# Patient Record
Sex: Female | Born: 1950 | Race: White | Hispanic: No | Smoking: Former smoker
Health system: Southern US, Community
[De-identification: ages and names within clinical notes are randomized; demographics above are authoritative.]

## PROBLEM LIST (undated history)

## (undated) DIAGNOSIS — F32A Depression, unspecified: Secondary | ICD-10-CM

## (undated) DIAGNOSIS — N189 Chronic kidney disease, unspecified: Secondary | ICD-10-CM

## (undated) DIAGNOSIS — F1120 Opioid dependence, uncomplicated: Secondary | ICD-10-CM

## (undated) DIAGNOSIS — D649 Anemia, unspecified: Secondary | ICD-10-CM

## (undated) DIAGNOSIS — K219 Gastro-esophageal reflux disease without esophagitis: Secondary | ICD-10-CM

## (undated) DIAGNOSIS — G629 Polyneuropathy, unspecified: Secondary | ICD-10-CM

## (undated) DIAGNOSIS — M4800 Spinal stenosis, site unspecified: Secondary | ICD-10-CM

## (undated) DIAGNOSIS — F988 Other specified behavioral and emotional disorders with onset usually occurring in childhood and adolescence: Secondary | ICD-10-CM

## (undated) DIAGNOSIS — H269 Unspecified cataract: Secondary | ICD-10-CM

## (undated) DIAGNOSIS — M25562 Pain in left knee: Secondary | ICD-10-CM

## (undated) DIAGNOSIS — E669 Obesity, unspecified: Secondary | ICD-10-CM

## (undated) DIAGNOSIS — I499 Cardiac arrhythmia, unspecified: Secondary | ICD-10-CM

## (undated) DIAGNOSIS — G709 Myoneural disorder, unspecified: Secondary | ICD-10-CM

## (undated) DIAGNOSIS — M1990 Unspecified osteoarthritis, unspecified site: Secondary | ICD-10-CM

## (undated) DIAGNOSIS — T7840XA Allergy, unspecified, initial encounter: Secondary | ICD-10-CM

## (undated) DIAGNOSIS — I10 Essential (primary) hypertension: Secondary | ICD-10-CM

## (undated) DIAGNOSIS — F419 Anxiety disorder, unspecified: Secondary | ICD-10-CM

## (undated) DIAGNOSIS — K635 Polyp of colon: Secondary | ICD-10-CM

## (undated) HISTORY — DX: Essential (primary) hypertension: I10

## (undated) HISTORY — DX: Chronic kidney disease, unspecified: N18.9

## (undated) HISTORY — PX: BREAST SURGERY: SHX581

## (undated) HISTORY — PX: OTHER SURGICAL HISTORY: SHX169

## (undated) HISTORY — DX: Pain in left knee: M25.562

## (undated) HISTORY — PX: JOINT REPLACEMENT: SHX530

## (undated) HISTORY — DX: Unspecified cataract: H26.9

## (undated) HISTORY — DX: Opioid dependence, uncomplicated: F11.20

## (undated) HISTORY — DX: Anxiety disorder, unspecified: F41.9

## (undated) HISTORY — DX: Depression, unspecified: F32.A

## (undated) HISTORY — DX: Obesity, unspecified: E66.9

## (undated) HISTORY — DX: Polyp of colon: K63.5

## (undated) HISTORY — DX: Gastro-esophageal reflux disease without esophagitis: K21.9

## (undated) HISTORY — PX: APPENDECTOMY: SHX54

## (undated) HISTORY — PX: ROTATOR CUFF REPAIR: SHX139

## (undated) HISTORY — PX: CHOLECYSTECTOMY: SHX55

## (undated) HISTORY — DX: Allergy, unspecified, initial encounter: T78.40XA

## (undated) HISTORY — DX: Spinal stenosis, site unspecified: M48.00

## (undated) HISTORY — DX: Myoneural disorder, unspecified: G70.9

## (undated) HISTORY — DX: Other specified behavioral and emotional disorders with onset usually occurring in childhood and adolescence: F98.8

## (undated) HISTORY — PX: BREAST BIOPSY: SHX20

---

## 1998-02-02 ENCOUNTER — Other Ambulatory Visit: Admission: RE | Admit: 1998-02-02 | Discharge: 1998-02-02 | Payer: Self-pay | Admitting: *Deleted

## 1999-10-05 ENCOUNTER — Encounter: Admission: RE | Admit: 1999-10-05 | Discharge: 1999-10-05 | Payer: Self-pay | Admitting: *Deleted

## 1999-10-05 ENCOUNTER — Encounter: Payer: Self-pay | Admitting: *Deleted

## 2000-04-05 ENCOUNTER — Encounter: Payer: Self-pay | Admitting: *Deleted

## 2000-04-05 ENCOUNTER — Encounter: Payer: Self-pay | Admitting: Emergency Medicine

## 2000-04-05 ENCOUNTER — Emergency Department (HOSPITAL_COMMUNITY): Admission: EM | Admit: 2000-04-05 | Discharge: 2000-04-05 | Payer: Self-pay | Admitting: *Deleted

## 2001-07-22 HISTORY — PX: GASTRIC BYPASS: SHX52

## 2002-02-03 ENCOUNTER — Other Ambulatory Visit: Admission: RE | Admit: 2002-02-03 | Discharge: 2002-02-03 | Payer: Self-pay | Admitting: Internal Medicine

## 2002-03-29 ENCOUNTER — Encounter: Admission: RE | Admit: 2002-03-29 | Discharge: 2002-05-05 | Payer: Self-pay | Admitting: Surgery

## 2002-03-31 ENCOUNTER — Encounter: Admission: RE | Admit: 2002-03-31 | Discharge: 2002-03-31 | Payer: Self-pay | Admitting: Surgery

## 2002-03-31 ENCOUNTER — Encounter: Payer: Self-pay | Admitting: Surgery

## 2002-05-21 ENCOUNTER — Encounter: Admission: RE | Admit: 2002-05-21 | Discharge: 2002-08-19 | Payer: Self-pay | Admitting: Surgery

## 2002-06-08 ENCOUNTER — Inpatient Hospital Stay (HOSPITAL_COMMUNITY): Admission: RE | Admit: 2002-06-08 | Discharge: 2002-06-16 | Payer: Self-pay | Admitting: Surgery

## 2002-06-09 ENCOUNTER — Encounter: Payer: Self-pay | Admitting: Surgery

## 2002-06-11 ENCOUNTER — Encounter: Payer: Self-pay | Admitting: Surgery

## 2002-06-13 ENCOUNTER — Encounter: Payer: Self-pay | Admitting: Surgery

## 2002-06-14 ENCOUNTER — Encounter: Payer: Self-pay | Admitting: Surgery

## 2002-06-16 ENCOUNTER — Encounter: Payer: Self-pay | Admitting: Internal Medicine

## 2002-07-22 HISTORY — PX: KIDNEY DONATION: SHX685

## 2002-10-05 ENCOUNTER — Encounter: Admission: RE | Admit: 2002-10-05 | Discharge: 2003-01-03 | Payer: Self-pay | Admitting: Surgery

## 2004-10-11 ENCOUNTER — Ambulatory Visit: Payer: Self-pay | Admitting: Internal Medicine

## 2005-04-23 ENCOUNTER — Ambulatory Visit: Payer: Self-pay | Admitting: Internal Medicine

## 2005-04-24 ENCOUNTER — Encounter: Payer: Self-pay | Admitting: Internal Medicine

## 2005-04-24 ENCOUNTER — Encounter: Admission: RE | Admit: 2005-04-24 | Discharge: 2005-04-24 | Payer: Self-pay | Admitting: Internal Medicine

## 2005-04-30 ENCOUNTER — Other Ambulatory Visit: Admission: RE | Admit: 2005-04-30 | Discharge: 2005-04-30 | Payer: Self-pay | Admitting: Internal Medicine

## 2005-04-30 ENCOUNTER — Encounter: Payer: Self-pay | Admitting: Internal Medicine

## 2005-04-30 ENCOUNTER — Ambulatory Visit: Payer: Self-pay | Admitting: Internal Medicine

## 2005-05-20 ENCOUNTER — Ambulatory Visit: Payer: Self-pay | Admitting: Gastroenterology

## 2005-06-03 ENCOUNTER — Encounter (INDEPENDENT_AMBULATORY_CARE_PROVIDER_SITE_OTHER): Payer: Self-pay | Admitting: *Deleted

## 2005-06-03 ENCOUNTER — Ambulatory Visit: Payer: Self-pay | Admitting: Gastroenterology

## 2005-06-03 ENCOUNTER — Encounter: Payer: Self-pay | Admitting: Internal Medicine

## 2005-06-03 LAB — HM COLONOSCOPY

## 2005-06-08 ENCOUNTER — Encounter: Payer: Self-pay | Admitting: Internal Medicine

## 2005-07-24 ENCOUNTER — Ambulatory Visit: Payer: Self-pay | Admitting: Family Medicine

## 2005-10-31 ENCOUNTER — Ambulatory Visit: Payer: Self-pay | Admitting: Internal Medicine

## 2005-11-21 ENCOUNTER — Ambulatory Visit: Payer: Self-pay | Admitting: Internal Medicine

## 2005-12-26 ENCOUNTER — Ambulatory Visit: Payer: Self-pay | Admitting: Internal Medicine

## 2006-07-11 ENCOUNTER — Ambulatory Visit: Payer: Self-pay | Admitting: Family Medicine

## 2006-09-23 ENCOUNTER — Ambulatory Visit: Payer: Self-pay | Admitting: Internal Medicine

## 2006-12-04 ENCOUNTER — Encounter: Payer: Self-pay | Admitting: Internal Medicine

## 2006-12-04 DIAGNOSIS — J309 Allergic rhinitis, unspecified: Secondary | ICD-10-CM | POA: Insufficient documentation

## 2006-12-04 DIAGNOSIS — Z9189 Other specified personal risk factors, not elsewhere classified: Secondary | ICD-10-CM | POA: Insufficient documentation

## 2006-12-04 DIAGNOSIS — E669 Obesity, unspecified: Secondary | ICD-10-CM | POA: Insufficient documentation

## 2006-12-04 DIAGNOSIS — Z9889 Other specified postprocedural states: Secondary | ICD-10-CM | POA: Insufficient documentation

## 2007-01-20 ENCOUNTER — Ambulatory Visit: Payer: Self-pay | Admitting: Internal Medicine

## 2007-01-20 DIAGNOSIS — I10 Essential (primary) hypertension: Secondary | ICD-10-CM | POA: Insufficient documentation

## 2007-01-21 ENCOUNTER — Telehealth: Payer: Self-pay | Admitting: Internal Medicine

## 2007-03-16 ENCOUNTER — Telehealth (INDEPENDENT_AMBULATORY_CARE_PROVIDER_SITE_OTHER): Payer: Self-pay | Admitting: *Deleted

## 2007-04-07 ENCOUNTER — Encounter: Payer: Self-pay | Admitting: Internal Medicine

## 2007-05-26 ENCOUNTER — Ambulatory Visit: Payer: Self-pay | Admitting: Internal Medicine

## 2007-07-28 ENCOUNTER — Ambulatory Visit: Payer: Self-pay | Admitting: Internal Medicine

## 2007-07-28 LAB — CONVERTED CEMR LAB
ALT: 13 units/L (ref 0–35)
AST: 16 units/L (ref 0–37)
Albumin: 3.8 g/dL (ref 3.5–5.2)
Alkaline Phosphatase: 76 units/L (ref 39–117)
BUN: 6 mg/dL (ref 6–23)
Basophils Absolute: 0 10*3/uL (ref 0.0–0.1)
Basophils Relative: 0.3 % (ref 0.0–1.0)
Bilirubin Urine: NEGATIVE
Bilirubin, Direct: 0.1 mg/dL (ref 0.0–0.3)
Blood in Urine, dipstick: NEGATIVE
CO2: 30 meq/L (ref 19–32)
Calcium: 9.4 mg/dL (ref 8.4–10.5)
Chloride: 95 meq/L — ABNORMAL LOW (ref 96–112)
Cholesterol: 192 mg/dL (ref 0–200)
Creatinine, Ser: 1 mg/dL (ref 0.4–1.2)
Eosinophils Absolute: 0.1 10*3/uL (ref 0.0–0.6)
Eosinophils Relative: 1.8 % (ref 0.0–5.0)
GFR calc Af Amer: 73 mL/min
GFR calc non Af Amer: 61 mL/min
Glucose, Bld: 88 mg/dL (ref 70–99)
Glucose, Urine, Semiquant: NEGATIVE
HCT: 34.3 % — ABNORMAL LOW (ref 36.0–46.0)
HDL: 71.7 mg/dL (ref >39.0)
Hemoglobin: 11.8 g/dL — ABNORMAL LOW (ref 12.0–15.0)
Ketones, urine, test strip: NEGATIVE
LDL Cholesterol: 110 mg/dL — ABNORMAL HIGH (ref 0–99)
Lymphocytes Relative: 24.1 % (ref 12.0–46.0)
MCHC: 34.5 g/dL (ref 30.0–36.0)
MCV: 85.7 fL (ref 78.0–100.0)
Monocytes Absolute: 0.3 10*3/uL (ref 0.2–0.7)
Monocytes Relative: 7 % (ref 3.0–11.0)
Neutro Abs: 2.9 10*3/uL (ref 1.4–7.7)
Neutrophils Relative %: 66.8 % (ref 43.0–77.0)
Nitrite: NEGATIVE
Platelets: 337 10*3/uL (ref 150–400)
Potassium: 3.9 meq/L (ref 3.5–5.1)
Protein, U semiquant: NEGATIVE
RBC: 4 M/uL (ref 3.87–5.11)
RDW: 13.1 % (ref 11.5–14.6)
Sodium: 133 meq/L — ABNORMAL LOW (ref 135–145)
Specific Gravity, Urine: 1.015
TSH: 0.75 microintl units/mL (ref 0.35–5.50)
Total Bilirubin: 0.6 mg/dL (ref 0.3–1.2)
Total CHOL/HDL Ratio: 2.7
Total Protein: 6.1 g/dL (ref 6.0–8.3)
Triglycerides: 53 mg/dL (ref 0–149)
Urobilinogen, UA: 0.2
VLDL: 11 mg/dL (ref 0–40)
WBC Urine, dipstick: NEGATIVE
WBC: 4.4 10*3/uL — ABNORMAL LOW (ref 4.5–10.5)
pH: 7.5

## 2007-08-03 ENCOUNTER — Ambulatory Visit: Payer: Self-pay | Admitting: Internal Medicine

## 2007-08-03 DIAGNOSIS — Z860100 Personal history of colon polyps, unspecified: Secondary | ICD-10-CM | POA: Insufficient documentation

## 2007-09-14 ENCOUNTER — Ambulatory Visit: Payer: Self-pay | Admitting: Internal Medicine

## 2007-09-14 DIAGNOSIS — F988 Other specified behavioral and emotional disorders with onset usually occurring in childhood and adolescence: Secondary | ICD-10-CM | POA: Insufficient documentation

## 2007-09-22 ENCOUNTER — Telehealth: Payer: Self-pay | Admitting: Internal Medicine

## 2007-10-12 ENCOUNTER — Ambulatory Visit: Payer: Self-pay | Admitting: Internal Medicine

## 2007-11-09 ENCOUNTER — Ambulatory Visit: Payer: Self-pay | Admitting: Internal Medicine

## 2008-01-19 ENCOUNTER — Telehealth: Payer: Self-pay | Admitting: Internal Medicine

## 2008-01-20 ENCOUNTER — Telehealth: Payer: Self-pay | Admitting: Internal Medicine

## 2008-02-01 ENCOUNTER — Ambulatory Visit: Payer: Self-pay | Admitting: Internal Medicine

## 2008-03-08 ENCOUNTER — Telehealth: Payer: Self-pay | Admitting: Internal Medicine

## 2008-03-17 ENCOUNTER — Ambulatory Visit: Payer: Self-pay | Admitting: Family Medicine

## 2008-03-17 DIAGNOSIS — J0100 Acute maxillary sinusitis, unspecified: Secondary | ICD-10-CM | POA: Insufficient documentation

## 2008-04-29 ENCOUNTER — Telehealth: Payer: Self-pay | Admitting: Internal Medicine

## 2008-05-02 ENCOUNTER — Telehealth: Payer: Self-pay | Admitting: Internal Medicine

## 2008-05-25 ENCOUNTER — Ambulatory Visit: Payer: Self-pay | Admitting: Internal Medicine

## 2008-06-01 ENCOUNTER — Telehealth: Payer: Self-pay | Admitting: Internal Medicine

## 2008-06-20 ENCOUNTER — Telehealth: Payer: Self-pay | Admitting: Internal Medicine

## 2008-06-23 ENCOUNTER — Telehealth: Payer: Self-pay | Admitting: Internal Medicine

## 2008-07-11 ENCOUNTER — Ambulatory Visit: Payer: Self-pay | Admitting: Internal Medicine

## 2008-07-11 DIAGNOSIS — J069 Acute upper respiratory infection, unspecified: Secondary | ICD-10-CM | POA: Insufficient documentation

## 2008-07-27 ENCOUNTER — Ambulatory Visit: Payer: Self-pay | Admitting: Internal Medicine

## 2008-07-27 LAB — CONVERTED CEMR LAB
ALT: 24 units/L (ref 0–35)
AST: 26 units/L (ref 0–37)
Albumin: 3.9 g/dL (ref 3.5–5.2)
Alkaline Phosphatase: 86 units/L (ref 39–117)
BUN: 13 mg/dL (ref 6–23)
Basophils Absolute: 0 10*3/uL (ref 0.0–0.1)
Basophils Relative: 0.4 % (ref 0.0–3.0)
Bilirubin Urine: NEGATIVE
Bilirubin, Direct: 0.1 mg/dL (ref 0.0–0.3)
Blood in Urine, dipstick: NEGATIVE
CO2: 31 meq/L (ref 19–32)
Calcium: 9.5 mg/dL (ref 8.4–10.5)
Chloride: 104 meq/L (ref 96–112)
Cholesterol: 206 mg/dL (ref 0–200)
Creatinine, Ser: 1.1 mg/dL (ref 0.4–1.2)
Direct LDL: 109.1 mg/dL
Eosinophils Absolute: 0.1 10*3/uL (ref 0.0–0.7)
Eosinophils Relative: 3 % (ref 0.0–5.0)
GFR calc Af Amer: 66 mL/min
GFR calc non Af Amer: 54 mL/min
Glucose, Bld: 127 mg/dL — ABNORMAL HIGH (ref 70–99)
Glucose, Urine, Semiquant: NEGATIVE
HCT: 34.8 % — ABNORMAL LOW (ref 36.0–46.0)
HDL: 65.2 mg/dL (ref >39.0)
Hemoglobin: 11.7 g/dL — ABNORMAL LOW (ref 12.0–15.0)
Ketones, urine, test strip: NEGATIVE
Lymphocytes Relative: 24 % (ref 12.0–46.0)
MCHC: 33.6 g/dL (ref 30.0–36.0)
MCV: 88.6 fL (ref 78.0–100.0)
Monocytes Absolute: 0.3 10*3/uL (ref 0.1–1.0)
Monocytes Relative: 6.9 % (ref 3.0–12.0)
Neutro Abs: 3.2 10*3/uL (ref 1.4–7.7)
Neutrophils Relative %: 65.7 % (ref 43.0–77.0)
Nitrite: NEGATIVE
Platelets: 253 10*3/uL (ref 150–400)
Potassium: 4 meq/L (ref 3.5–5.1)
Protein, U semiquant: NEGATIVE
RBC: 3.93 M/uL (ref 3.87–5.11)
RDW: 13.5 % (ref 11.5–14.6)
Sodium: 144 meq/L (ref 135–145)
Specific Gravity, Urine: 1.02
TSH: 0.82 microintl units/mL (ref 0.35–5.50)
Total Bilirubin: 0.6 mg/dL (ref 0.3–1.2)
Total CHOL/HDL Ratio: 3.2
Total Protein: 6.9 g/dL (ref 6.0–8.3)
Triglycerides: 103 mg/dL (ref 0–149)
Urobilinogen, UA: 0.2
VLDL: 21 mg/dL (ref 0–40)
WBC Urine, dipstick: NEGATIVE
WBC: 4.7 10*3/uL (ref 4.5–10.5)
pH: 5

## 2008-08-02 ENCOUNTER — Ambulatory Visit: Payer: Self-pay | Admitting: Internal Medicine

## 2008-10-06 ENCOUNTER — Ambulatory Visit: Payer: Self-pay | Admitting: Family Medicine

## 2008-10-06 DIAGNOSIS — M25569 Pain in unspecified knee: Secondary | ICD-10-CM

## 2008-10-10 ENCOUNTER — Telehealth: Payer: Self-pay | Admitting: *Deleted

## 2008-10-11 ENCOUNTER — Ambulatory Visit: Payer: Self-pay | Admitting: Internal Medicine

## 2008-10-24 ENCOUNTER — Telehealth: Payer: Self-pay | Admitting: Internal Medicine

## 2008-11-09 ENCOUNTER — Telehealth: Payer: Self-pay | Admitting: Internal Medicine

## 2008-11-17 ENCOUNTER — Encounter: Payer: Self-pay | Admitting: Internal Medicine

## 2008-12-06 ENCOUNTER — Telehealth: Payer: Self-pay | Admitting: Internal Medicine

## 2008-12-06 ENCOUNTER — Telehealth (INDEPENDENT_AMBULATORY_CARE_PROVIDER_SITE_OTHER): Payer: Self-pay | Admitting: *Deleted

## 2008-12-20 ENCOUNTER — Telehealth: Payer: Self-pay | Admitting: Internal Medicine

## 2008-12-27 ENCOUNTER — Telehealth: Payer: Self-pay | Admitting: Internal Medicine

## 2008-12-30 ENCOUNTER — Ambulatory Visit: Payer: Self-pay | Admitting: Internal Medicine

## 2009-01-24 ENCOUNTER — Telehealth: Payer: Self-pay | Admitting: Internal Medicine

## 2009-01-26 ENCOUNTER — Telehealth: Payer: Self-pay | Admitting: Internal Medicine

## 2009-02-01 ENCOUNTER — Telehealth: Payer: Self-pay | Admitting: Internal Medicine

## 2009-02-06 ENCOUNTER — Ambulatory Visit: Payer: Self-pay | Admitting: Internal Medicine

## 2009-02-06 DIAGNOSIS — F1910 Other psychoactive substance abuse, uncomplicated: Secondary | ICD-10-CM | POA: Insufficient documentation

## 2009-02-14 ENCOUNTER — Telehealth (INDEPENDENT_AMBULATORY_CARE_PROVIDER_SITE_OTHER): Payer: Self-pay | Admitting: *Deleted

## 2009-02-23 ENCOUNTER — Telehealth: Payer: Self-pay | Admitting: Internal Medicine

## 2009-03-23 ENCOUNTER — Telehealth: Payer: Self-pay | Admitting: Internal Medicine

## 2009-03-24 ENCOUNTER — Telehealth (INDEPENDENT_AMBULATORY_CARE_PROVIDER_SITE_OTHER): Payer: Self-pay | Admitting: *Deleted

## 2009-03-29 ENCOUNTER — Telehealth (INDEPENDENT_AMBULATORY_CARE_PROVIDER_SITE_OTHER): Payer: Self-pay | Admitting: *Deleted

## 2009-04-04 ENCOUNTER — Ambulatory Visit: Payer: Self-pay | Admitting: Internal Medicine

## 2009-04-13 ENCOUNTER — Telehealth: Payer: Self-pay | Admitting: Internal Medicine

## 2009-06-05 ENCOUNTER — Telehealth: Payer: Self-pay | Admitting: Internal Medicine

## 2009-06-13 ENCOUNTER — Encounter: Payer: Self-pay | Admitting: Internal Medicine

## 2009-06-26 ENCOUNTER — Telehealth: Payer: Self-pay | Admitting: Internal Medicine

## 2009-07-04 ENCOUNTER — Encounter (INDEPENDENT_AMBULATORY_CARE_PROVIDER_SITE_OTHER): Payer: Self-pay | Admitting: *Deleted

## 2009-07-04 ENCOUNTER — Ambulatory Visit: Payer: Self-pay | Admitting: Internal Medicine

## 2009-08-21 ENCOUNTER — Telehealth: Payer: Self-pay | Admitting: Internal Medicine

## 2009-08-23 ENCOUNTER — Telehealth: Payer: Self-pay | Admitting: Internal Medicine

## 2009-09-18 ENCOUNTER — Telehealth: Payer: Self-pay | Admitting: Internal Medicine

## 2009-12-11 ENCOUNTER — Telehealth: Payer: Self-pay | Admitting: Internal Medicine

## 2009-12-19 ENCOUNTER — Ambulatory Visit: Payer: Self-pay | Admitting: Internal Medicine

## 2009-12-19 DIAGNOSIS — IMO0001 Reserved for inherently not codable concepts without codable children: Secondary | ICD-10-CM

## 2010-05-15 ENCOUNTER — Telehealth: Payer: Self-pay

## 2010-05-29 ENCOUNTER — Ambulatory Visit: Payer: Self-pay | Admitting: Internal Medicine

## 2010-05-29 LAB — CONVERTED CEMR LAB
ALT: 14 units/L (ref 0–35)
AST: 18 units/L (ref 0–37)
Albumin: 3.8 g/dL (ref 3.5–5.2)
Alkaline Phosphatase: 91 units/L (ref 39–117)
BUN: 17 mg/dL (ref 6–23)
Basophils Absolute: 0 10*3/uL (ref 0.0–0.1)
Basophils Relative: 0.7 % (ref 0.0–3.0)
Bilirubin Urine: NEGATIVE
Bilirubin, Direct: 0.1 mg/dL (ref 0.0–0.3)
Blood in Urine, dipstick: NEGATIVE
CO2: 31 meq/L (ref 19–32)
Calcium: 9.2 mg/dL (ref 8.4–10.5)
Chloride: 99 meq/L (ref 96–112)
Cholesterol: 195 mg/dL (ref 0–200)
Creatinine, Ser: 1 mg/dL (ref 0.4–1.2)
Eosinophils Absolute: 0.1 10*3/uL (ref 0.0–0.7)
Eosinophils Relative: 3.1 % (ref 0.0–5.0)
GFR calc non Af Amer: 58.11 mL/min (ref >60)
Glucose, Bld: 95 mg/dL (ref 70–99)
Glucose, Urine, Semiquant: NEGATIVE
HCT: 32.6 % — ABNORMAL LOW (ref 36.0–46.0)
HDL: 69.5 mg/dL (ref >39.00)
Hemoglobin: 10.7 g/dL — ABNORMAL LOW (ref 12.0–15.0)
Ketones, urine, test strip: NEGATIVE
LDL Cholesterol: 113 mg/dL — ABNORMAL HIGH (ref 0–99)
Lymphocytes Relative: 23.1 % (ref 12.0–46.0)
Lymphs Abs: 0.9 10*3/uL (ref 0.7–4.0)
MCHC: 32.9 g/dL (ref 30.0–36.0)
MCV: 78.9 fL (ref 78.0–100.0)
Monocytes Absolute: 0.3 10*3/uL (ref 0.1–1.0)
Monocytes Relative: 6.8 % (ref 3.0–12.0)
Neutro Abs: 2.5 10*3/uL (ref 1.4–7.7)
Neutrophils Relative %: 66.3 % (ref 43.0–77.0)
Nitrite: NEGATIVE
Platelets: 263 10*3/uL (ref 150.0–400.0)
Potassium: 3.4 meq/L — ABNORMAL LOW (ref 3.5–5.1)
Protein, U semiquant: NEGATIVE
RBC: 4.13 M/uL (ref 3.87–5.11)
RDW: 14.8 % — ABNORMAL HIGH (ref 11.5–14.6)
Sodium: 139 meq/L (ref 135–145)
Specific Gravity, Urine: 1.02
TSH: 2.48 microintl units/mL (ref 0.35–5.50)
Total Bilirubin: 0.3 mg/dL (ref 0.3–1.2)
Total CHOL/HDL Ratio: 3
Total Protein: 6.3 g/dL (ref 6.0–8.3)
Triglycerides: 61 mg/dL (ref 0.0–149.0)
Urobilinogen, UA: 1
VLDL: 12.2 mg/dL (ref 0.0–40.0)
WBC Urine, dipstick: NEGATIVE
WBC: 3.7 10*3/uL — ABNORMAL LOW (ref 4.5–10.5)
pH: 6.5

## 2010-05-31 LAB — CONVERTED CEMR LAB: Vit D, 25-Hydroxy: 18 ng/mL — ABNORMAL LOW (ref 30–89)

## 2010-06-05 ENCOUNTER — Ambulatory Visit: Payer: Self-pay | Admitting: Internal Medicine

## 2010-06-20 ENCOUNTER — Encounter: Payer: Self-pay | Admitting: Internal Medicine

## 2010-06-22 ENCOUNTER — Encounter: Payer: Self-pay | Admitting: Internal Medicine

## 2010-08-14 ENCOUNTER — Telehealth: Payer: Self-pay | Admitting: Internal Medicine

## 2010-08-21 NOTE — Progress Notes (Signed)
Summary: refill  Phone Note Refill Request Call back at Home Phone [phone] Message from:  Patient--live call  Refills Requested: Medication #1:  AMPHETAMINE-DEXTROAMPHETAMINE 30 MG XR24H-CAP one daily call pt when ready.  Initial call taken by: Warnell Forester,  May 15, 2010 8:26 AM    Prescriptions: AMPHETAMINE-DEXTROAMPHETAMINE 30 MG XR24H-CAP (AMPHETAMINE-DEXTROAMPHETAMINE) one daily  #30 x 0   Entered by:   Willy Eddy, LPN   Authorized by:   Gordy Savers  MD   Signed by:   Willy Eddy, LPN on 95/62/1308   Method used:   Print then Give to Patient   RxID:   [id number]

## 2010-08-21 NOTE — Medication Information (Signed)
Summary: PRIOR AUTHORIZATION REQUEST  PRIOR AUTHORIZATION REQUEST Provider: This provider was preselected by the workflow.  Signature: The signature status of this document was preset by the workflow  Processed by [employer] Local Indexer Client @ Thursday, July 13, 2009 9:57:24 AM using version:2010.1.2.11(2.4)   Manually Indexed By: [id number]  idlBatchDetail: [id number]   _____________________________________________________________________  External Attachment:    Type:   Image     Comment:   External Document

## 2010-08-21 NOTE — Progress Notes (Signed)
Summary: refill adderall  Phone Note Call from Patient   Caller: Patient Summary of Call: request refill on adderall call when ready for pick up  [phone] Initial call taken by: Duard Brady LPN,  Dec 11, 2009 5:23 PM    Prescriptions: ADDERALL 10 MG TABS (AMPHETAMINE-DEXTROAMPHETAMINE) One tid  #90 x 0   Entered and Authorized by:   Gordy Savers  MD   Signed by:   Gordy Savers  MD on 12/12/2009   Method used:   Print then Give to Patient   RxID:   [id number] AMPHETAMINE-DEXTROAMPHETAMINE 30 MG XR24H-CAP (AMPHETAMINE-DEXTROAMPHETAMINE) one daily  #30 x 0   Entered and Authorized by:   Gordy Savers  MD   Signed by:   Gordy Savers  MD on 12/12/2009   Method used:   Print then Give to Patient   RxID:   [id number]   Appended Document: refill adderall pt aware rx's ready for pick up . KIK

## 2010-08-21 NOTE — Progress Notes (Signed)
  Phone Note Call from Patient   Caller: Patient Call For: k Summary of Call: claims she left medicines in hotel- needs both Rx's for Adderall Initial call taken by: Raechel Ache, RN,  August 21, 2009 8:23 AM  Follow-up for Phone Call        #30 each with RF 1 Follow-up by: Gordy Savers  MD,  August 21, 2009 10:09 AM    Prescriptions: AMPHETAMINE-DEXTROAMPHETAMINE 30 MG XR24H-CAP (AMPHETAMINE-DEXTROAMPHETAMINE) one daily  #30 x 0   Entered by:   Raechel Ache, RN   Authorized by:   Gordy Savers  MD   Signed by:   Raechel Ache, RN on 08/21/2009   Method used:   Print then Give to Patient   RxID:   [id number] AMPHETAMINE SALT COMBO 10 MG  TABS (AMPHETAMINE-DEXTROAMPHETAMINE) one  daily in the afternoon  #30 x 0   Entered by:   Raechel Ache, RN   Authorized by:   Gordy Savers  MD   Signed by:   Raechel Ache, RN on 08/21/2009   Method used:   Print then Give to Patient   RxID:   [id number]  adderal er 30 #30 and adderal 10 #30 RF 2

## 2010-08-21 NOTE — Assessment & Plan Note (Signed)
Summary: cpx//ccm/PT RSC/CJR   Vital Signs:  Patient profile:   60 year old female Height:      64.75 inches Weight:      187 pounds BMI:     31.47 Temp:     98.1 degrees F oral BP sitting:   110 / 82  (left arm) Cuff size:   regular  Vitals Entered By: Duard Brady LPN (June 05, 2010 2:50 PM) CC: cpx - doing well  Is Patient Diabetic? No   CC:  cpx - doing well .  History of Present Illness: 60 year old patient who is seen today for a health maintenance examination.  She has a history of hypertension, exogenous obesity and has recently diagnosed and treated for vitamin D deficiency.  She has allergic rhinitis.  She has a history of a narcotic addiction and is on a tapering dose of suboxone.  Allergies: 1)  Sulfamethoxazole (Sulfamethoxazole)  Past History:  Past Medical History: Allergic rhinitis Hypertension obesity status post gastric bypass Colonic polyps, hx of (hyperplastic) ADD chronic left knee pain   Narcotic addiction vitamin D deficiency  Past Surgical History: Reviewed history from 08/02/2008 and no changes required. Cholecystectomy 2004, kidney donation breast biopsy 1998 2003, laparoscopic gastric jejunostomy colonoscopy November 2006  Family History: Reviewed history from 08/02/2008 and no changes required. father died age 50 complications of COPD mother died age 21   Status post CABG history congestive heart failure  One brother history of CAD  One sister, type 2 diabetes, status post renal transplant  Social History: Reviewed history from 08/02/2008 and no changes required. Married  Review of Systems  The patient denies anorexia, fever, weight loss, weight gain, vision loss, decreased hearing, hoarseness, chest pain, syncope, dyspnea on exertion, peripheral edema, prolonged cough, headaches, hemoptysis, abdominal pain, melena, hematochezia, severe indigestion/heartburn, hematuria, incontinence, genital sores, muscle weakness,  suspicious skin lesions, transient blindness, difficulty walking, depression, unusual weight change, abnormal bleeding, enlarged lymph nodes, angioedema, and breast masses.    Physical Exam  General:  overweight-appearing.  normal blood pressureoverweight-appearing.   Head:  Normocephalic and atraumatic without obvious abnormalities. No apparent alopecia or balding. Eyes:  No corneal or conjunctival inflammation noted. EOMI. Perrla. Funduscopic exam benign, without hemorrhages, exudates or papilledema. Vision grossly normal. Ears:  External ear exam shows no significant lesions or deformities.  Otoscopic examination reveals clear canals, tympanic membranes are intact bilaterally without bulging, retraction, inflammation or discharge. Hearing is grossly normal bilaterally. Mouth:  Oral mucosa and oropharynx without lesions or exudates.  Teeth in good repair. Neck:  No deformities, masses, or tenderness noted. Chest Wall:  No deformities, masses, or tenderness noted. Breasts:  No mass, nodules, thickening, tenderness, bulging, retraction, inflamation, nipple discharge or skin changes noted.   Lungs:  Normal respiratory effort, chest expands symmetrically. Lungs are clear to auscultation, no crackles or wheezes. Heart:  Normal rate and regular rhythm. S1 and S2 normal without gallop, murmur, click, rub or other extra sounds. Abdomen:  Bowel sounds positive,abdomen soft and non-tender without masses, organomegaly or hernias noted. Rectal:  No external abnormalities noted. Normal sphincter tone. No rectal masses or tenderness. Genitalia:  Normal introitus for age, no external lesions, no vaginal discharge, mucosa pink and moist, no vaginal or cervical lesions, no vaginal atrophy, no friaility or hemorrhage, normal uterus size and position, no adnexal masses or tenderness Msk:  No deformity or scoliosis noted of thoracic or lumbar spine.   Pulses:  R and L carotid,radial,femoral,dorsalis pedis and  posterior tibial pulses are full  and equal bilaterally Extremities:  No clubbing, cyanosis, edema, or deformity noted with normal full range of motion of all joints.   Neurologic:  No cranial nerve deficits noted. Station and gait are normal. Plantar reflexes are down-going bilaterally. DTRs are symmetrical throughout. Sensory, motor and coordinative functions appear intact. Skin:  Intact without suspicious lesions or rashes Cervical Nodes:  No lymphadenopathy noted Axillary Nodes:  No palpable lymphadenopathy Inguinal Nodes:  No significant adenopathy Psych:  Cognition and judgment appear intact. Alert and cooperative with normal attention span and concentration. No apparent delusions, illusions, hallucinations   Complete Medication List: 1)  Ambien 10 Mg Tabs (Zolpidem tartrate) .Marland Kitchen.. 1 at bedtime as needed 2)  Hydrochlorothiazide 25 Mg Tabs (Hydrochlorothiazide) .... Take 1 tablet by mouth once a day 3)  Prozac 40 Mg Caps (Fluoxetine hcl) .Marland Kitchen.. 1 two times a day 4)  Amphetamine Salt Combo 10 Mg Tabs (Amphetamine-dextroamphetamine) .... One  daily in the afternoon 5)  Nasacort Aq 55 Mcg/act Aers (Triamcinolone acetonide(nasal)) .... 2 sprays each nostril qd 6)  Suboxone 8-2 Mg Subl (Buprenorphine hcl-naloxone hcl) .Marland Kitchen.. 1 two times a day 7)  Amphetamine-dextroamphetamine 30 Mg Xr24h-cap (Amphetamine-dextroamphetamine) .... One daily 8)  Vitamin D (ergocalciferol) 50000 Unit Caps (Ergocalciferol) .Marland Kitchen.. 1 by mouth qweek for 3 mos  Other Orders: T-Bone Densitometry [phone])  Patient Instructions: 1)  Limit your Sodium (Salt). 2)  It is important that you exercise regularly at least 20 minutes 5 times a week. If you develop chest pain, have severe difficulty breathing, or feel very tired , stop exercising immediately and seek medical attention. 3)  You need to lose weight. Consider a lower calorie diet and regular exercise.  4)  Take calcium +Vitamin D daily. 5)  bone density study 6)  Please  schedule a follow-up appointment in 6 months. Prescriptions: AMPHETAMINE-DEXTROAMPHETAMINE 30 MG XR24H-CAP (AMPHETAMINE-DEXTROAMPHETAMINE) one daily  #30 x 0   Entered and Authorized by:   Gordy Savers  MD   Signed by:   Gordy Savers  MD on 06/05/2010   Method used:   Print then Give to Patient   RxID:   [id number] AMPHETAMINE-DEXTROAMPHETAMINE 30 MG XR24H-CAP (AMPHETAMINE-DEXTROAMPHETAMINE) one daily  #30 x 0   Entered and Authorized by:   Gordy Savers  MD   Signed by:   Gordy Savers  MD on 06/05/2010   Method used:   Print then Give to Patient   RxID:   [id number] AMPHETAMINE-DEXTROAMPHETAMINE 30 MG XR24H-CAP (AMPHETAMINE-DEXTROAMPHETAMINE) one daily  #30 x 0   Entered and Authorized by:   Gordy Savers  MD   Signed by:   Gordy Savers  MD on 06/05/2010   Method used:   Print then Give to Patient   RxID:   [id number] NASACORT AQ 55 MCG/ACT AERS (TRIAMCINOLONE ACETONIDE(NASAL)) 2 sprays each nostril qd  #3 x 11   Entered and Authorized by:   Gordy Savers  MD   Signed by:   Gordy Savers  MD on 06/05/2010   Method used:   Print then Give to Patient   RxID:   [id number] HYDROCHLOROTHIAZIDE 25 MG TABS (HYDROCHLOROTHIAZIDE) Take 1 tablet by mouth once a day  #90 x 5   Entered and Authorized by:   Gordy Savers  MD   Signed by:   Gordy Savers  MD on 06/05/2010   Method used:   Print then Give to Patient   RxID:   [id number] AMBIEN 10 MG TABS (ZOLPIDEM TARTRATE)  1 at bedtime as needed  #30 x 3   Entered and Authorized by:   Gordy Savers  MD   Signed by:   Gordy Savers  MD on 06/05/2010   Method used:   Print then Give to Patient   RxID:   [id number]    Orders Added: 1)  T-Bone Densitometry [77080] 2)  Est. Patient 40-64 years [44010]   Immunization History:  Influenza Immunization History:    Influenza:  Historical (05/22/2010)   Immunization  History:  Influenza Immunization History:    Influenza:  Historical (05/22/2010)  done at work per pt. Earlean Polka

## 2010-08-21 NOTE — Progress Notes (Signed)
Summary: adderall reuqest  Phone Note Call from Patient Call back at [facility] Phone [phone]   Summary of Call: Insurance will not pay for the early Rxs you gave.  She requests Rx for a generic & not XR & not 10mg  because she already has that for the pm dose, that she can take instead of the Adderall XR 30mg  for this early refill.  Please call her if questions & when ready for pickup.   Initial call taken by: Rudy Jew, RN,  August 23, 2009 9:42 AM  Follow-up for Phone Call        Take adderal 10 mg TID Follow-up by: Gordy Savers  MD,  August 24, 2009 7:58 AM  Additional Follow-up for Phone Call Additional follow up Details #1::        She says will need a written script for this.  Would it be for #90?   Additional Follow-up by: Rudy Jew, RN,  August 29, 2009 8:05 AM    Additional Follow-up for Phone Call Additional follow up Details #2::    OK Follow-up by: Gordy Savers  MD,  August 29, 2009 9:10 AM  Additional Follow-up for Phone Call Additional follow up Details #3:: Details for Additional Follow-up Action Taken: Left message that Rx ready for pickup. Additional Follow-up by: Rudy Jew, RN,  August 29, 2009 2:39 PM  New/Updated Medications: ADDERALL 10 MG TABS (AMPHETAMINE-DEXTROAMPHETAMINE) One tid Prescriptions: ADDERALL 10 MG TABS (AMPHETAMINE-DEXTROAMPHETAMINE) One tid  #90 x 0   Entered by:   Rudy Jew, RN   Authorized by:   Gordy Savers  MD   Signed by:   Rudy Jew, RN on 08/29/2009   Method used:   Print then Give to Patient   RxID:   [id number]

## 2010-08-21 NOTE — Assessment & Plan Note (Signed)
Summary: TICK BITE/PS   Vital Signs:  Patient profile:   60 year old female Weight:      187 pounds Temp:     97.4 degrees F oral BP sitting:   120 / 64  (right arm) Cuff size:   regular  Vitals Entered By: Duard Brady LPN (Dec 19, 2009 10:05 AM) CC: tick bite - (R) grion area - from saturday Is Patient Diabetic? No   CC:  tick bite - (R) grion area - from saturday.  History of Present Illness: 60 year old patient who is seen today in after removing a tic from the right groin area over the weekend.  Last night.  She had an episode of chills, myalgias, and today still feels weak with nausea and some mild diarrhea.  Last night.  There has been no documented fever.  Denies any typical URI symptoms.  She has treated hypertension, which has been stable  Allergies: 1)  Sulfamethoxazole (Sulfamethoxazole)  Past History:  Past Medical History: Reviewed history from 04/04/2009 and no changes required. Allergic rhinitis Hypertension obesity status post gastric bypass Colonic polyps, hx of ADD chronic left knee pain  Review of Systems       The patient complains of anorexia, fever, and muscle weakness.  The patient denies weight loss, weight gain, vision loss, decreased hearing, hoarseness, chest pain, syncope, dyspnea on exertion, peripheral edema, prolonged cough, headaches, hemoptysis, abdominal pain, melena, hematochezia, severe indigestion/heartburn, hematuria, incontinence, genital sores, suspicious skin lesions, transient blindness, difficulty walking, depression, unusual weight change, abnormal bleeding, enlarged lymph nodes, angioedema, and breast masses.    Physical Exam  General:  overweight-appearing. normal blood pressure;  no acute distress Head:  Normocephalic and atraumatic without obvious abnormalities. No apparent alopecia or balding. Eyes:  No corneal or conjunctival inflammation noted. EOMI. Perrla. Funduscopic exam benign, without hemorrhages, exudates or  papilledema. Vision grossly normal. Ears:  External ear exam shows no significant lesions or deformities.  Otoscopic examination reveals clear canals, tympanic membranes are intact bilaterally without bulging, retraction, inflammation or discharge. Hearing is grossly normal bilaterally. Mouth:  Oral mucosa and oropharynx without lesions or exudates.  Teeth in good repair. Neck:  No deformities, masses, or tenderness noted. Lungs:  Normal respiratory effort, chest expands symmetrically. Lungs are clear to auscultation, no crackles or wheezes. Heart:  Normal rate and regular rhythm. S1 and S2 normal without gallop, murmur, click, rub or other extra sounds. Skin:  examination right groin area revealed no clear abnormalities   Impression & Recommendations:  Problem # 1:  TICK BITE (ICD-E906.4)  Problem # 2:  MYALGIA (ICD-729.1)  Her updated medication list for this problem includes:    Suboxone 8-2 Mg Subl (Buprenorphine hcl-naloxone hcl) .Marland Kitchen... 1 two times a day  Complete Medication List: 1)  Ambien 10 Mg Tabs (Zolpidem tartrate) .Marland Kitchen.. 1 at bedtime as needed 2)  Hydrochlorothiazide 25 Mg Tabs (Hydrochlorothiazide) .... Take 1 tablet by mouth once a day 3)  Prozac 40 Mg Caps (Fluoxetine hcl) .Marland Kitchen.. 1 two times a day 4)  Amphetamine Salt Combo 10 Mg Tabs (Amphetamine-dextroamphetamine) .... One  daily in the afternoon 5)  Nasacort Aq 55 Mcg/act Aers (Triamcinolone acetonide(nasal)) .... 2 sprays each nostril qd 6)  Suboxone 8-2 Mg Subl (Buprenorphine hcl-naloxone hcl) .Marland Kitchen.. 1 two times a day 7)  Amphetamine-dextroamphetamine 30 Mg Xr24h-cap (Amphetamine-dextroamphetamine) .... One daily 8)  Adderall 10 Mg Tabs (Amphetamine-dextroamphetamine) .... One tid 9)  Doxycycline Hyclate 100 Mg Caps (Doxycycline hyclate) .... One twice daily  Patient Instructions: 1)  call if there is any clinical worsening high fever, or headache Prescriptions: DOXYCYCLINE HYCLATE 100 MG CAPS (DOXYCYCLINE HYCLATE) one  twice daily  #14 x 0   Entered and Authorized by:   Gordy Savers  MD   Signed by:   Gordy Savers  MD on 12/19/2009   Method used:   Electronically to        [facility] [address] [phone]* (retail)       [address]       [place], [place]  [address]       Ph: [phone] or [phone]       Fax: [phone]   RxID:   [id number]

## 2010-08-21 NOTE — Medication Information (Signed)
Summary: Prior Authorization and Approval for Adderall  Prior Authorization and Approval for Adderall   Imported By: Maryln Gottron 06/27/2010 15:53:00  _____________________________________________________________________  External Attachment:    Type:   Image     Comment:   External Document

## 2010-08-21 NOTE — Progress Notes (Signed)
Summary: Adderall rx's needed  Phone Note Call from Patient   Caller: Patient Call For: Gordy Savers  MD Summary of Call: Pt called requesting Rx's for Adderall - ER 30mg  and 10 mg , needs 3 mos. call when ready for pick up.  KIK Initial call taken by: Duard Brady LPN,  September 18, 2009 11:53 AM    Prescriptions: AMPHETAMINE-DEXTROAMPHETAMINE 30 MG XR24H-CAP (AMPHETAMINE-DEXTROAMPHETAMINE) one daily  #30 x 0   Entered and Authorized by:   Gordy Savers  MD   Signed by:   Gordy Savers  MD on 09/18/2009   Method used:   Print then Give to Patient   RxID:   [id number] AMPHETAMINE SALT COMBO 10 MG  TABS (AMPHETAMINE-DEXTROAMPHETAMINE) one  daily in the afternoon  #30 x 0   Entered and Authorized by:   Gordy Savers  MD   Signed by:   Gordy Savers  MD on 09/18/2009   Method used:   Print then Give to Patient   RxID:   [id number]   Appended Document: Adderall rx's needed pt aware rx's ready for pick up.  KIK

## 2010-08-23 NOTE — Progress Notes (Signed)
Summary: refill zolpidem  Phone Note Refill Request Message from:  Fax from Pharmacy on August 14, 2010 3:24 PM  Refills Requested: Medication #1:  AMBIEN 10 MG TABS 1 at bedtime as needed [facility] [address]   Method Requested: Fax to Local Pharmacy Initial call taken by: Duard Brady LPN,  August 14, 2010 3:24 PM    Prescriptions: AMBIEN 10 MG TABS (ZOLPIDEM TARTRATE) 1 at bedtime as needed  #30 x 0   Entered by:   Duard Brady LPN   Authorized by:   Gordy Savers  MD   Signed by:   Duard Brady LPN on 14/78/2956   Method used:   Historical   RxID:   [id number]  faxed back to [facility] KIK

## 2010-08-24 NOTE — Medication Information (Signed)
Summary: Prior Authorization and Approval for Zolpidem Tartrate  Prior Authorization and Approval for Zolpidem Tartrate   Imported By: Maryln Gottron 06/22/2010 15:19:35  _____________________________________________________________________  External Attachment:    Type:   Image     Comment:   External Document

## 2010-09-06 ENCOUNTER — Other Ambulatory Visit: Payer: Self-pay | Admitting: Internal Medicine

## 2010-09-06 DIAGNOSIS — Z139 Encounter for screening, unspecified: Secondary | ICD-10-CM

## 2010-09-07 ENCOUNTER — Ambulatory Visit
Admission: RE | Admit: 2010-09-07 | Discharge: 2010-09-07 | Disposition: A | Discharge status: Home / Self Care | Payer: BC Managed Care – PPO | Source: Ambulatory Visit | Attending: Internal Medicine | Admitting: Internal Medicine

## 2010-09-07 DIAGNOSIS — Z139 Encounter for screening, unspecified: Secondary | ICD-10-CM

## 2010-09-10 ENCOUNTER — Telehealth: Payer: Self-pay | Admitting: Internal Medicine

## 2010-09-10 MED ORDER — AMPHETAMINE-DEXTROAMPHETAMINE 30 MG PO TABS: 30.0000 mg | ORAL_TABLET | Freq: Every day | ORAL | Status: DC

## 2010-09-10 NOTE — Telephone Encounter (Signed)
OK to drop by [place] for RF

## 2010-09-10 NOTE — Telephone Encounter (Signed)
Pt needs new rx adderall 30 mg.

## 2010-09-10 NOTE — Telephone Encounter (Signed)
Attempt to call - VM - left msg rx's are ready for pick up   KIK

## 2010-09-13 ENCOUNTER — Telehealth: Payer: Self-pay | Admitting: Internal Medicine

## 2010-09-13 NOTE — Telephone Encounter (Signed)
Spoke with pharmacy - pt states that she should be on XR capsule. That is what she has been on in the past. Gave verbal - will change in med list

## 2010-09-13 NOTE — Telephone Encounter (Signed)
Leslie [name] at [facility]   [phone].....called to adv that the pts Rx for Adderall was written for the regular adderall instead of the XR??   Wants to verify that this wasn't a mistake.... Can you call to clarify?

## 2010-09-18 ENCOUNTER — Other Ambulatory Visit: Payer: Self-pay

## 2010-09-18 MED ORDER — ZOLPIDEM TARTRATE 10 MG PO TABS: 10.0000 mg | ORAL_TABLET | Freq: Every evening | ORAL | Status: DC | PRN

## 2010-09-18 NOTE — Telephone Encounter (Signed)
Faxed refill back to [facility]. KIK

## 2010-12-07 NOTE — Discharge Summary (Signed)
NAME:  Leslie [name], Leslie [name] NO.:  [id number]   MEDICAL RECORD NO.:  [id number]                   PATIENT TYPE:  INP   LOCATION:  0462                                 FACILITY:  [facility]   PHYSICIAN:  Sandria Bales. Ezzard Standing, M.D.               DATE OF BIRTH:  08/03/1950   DATE OF ADMISSION:  06/08/2002  DATE OF DISCHARGE:  06/16/2002                                 DISCHARGE SUMMARY   DISCHARGE DIAGNOSES:  1. Morbid obesity with BMI of approximately 46.  2. Leak from staple line.  3. Postoperative atelectasis.   OPERATION PERFORMED:  The patient had a laparoscopic Roux-en-Y  gastrojejunostomy (retrocolic retrogastric) and esophagogastroscopy on the  18th of November 2003.  On the June 09, 2002 patient had a laparoscopic  exploration with oversewing of gastric staple line, a laparoscopic  gastrostomy placed with a number 24 gastrostomy tube, and  esophagogastroscopy.   INDICATIONS FOR PROCEDURE:  The patient is a 60 year old morbidly obese  white female who is a patient of Gordy Savers, M.D.  She had also  seen Dr. Donzetta Sprung in the past and I had seen her in the past for breast  mass.   She tried multiple diets to lose weight and her diet problems go back as far  as 35 years where she has tried multiple regimens including Weight Watchers,  Redux, Meridia, [place] Weight Loss, Atkin's diet.  She was interested in  surgical correction or assistance with her obesity.  She has seen Dr. Cyndia Skeeters  as a psychiatric evaluation.  She has been through our dietary information  sessions and has been to one of our bariatric talks.   Weight is approximately 282-288 pounds with a BMI between 45-46.   ALLERGIES:  She is allergic to SULFA DRUGS.   CURRENT MEDICATIONS:  Prozac.  She has been on for about eight years.   PAST SURGICAL HISTORY:  Open ____ back in 1981.  She had a breast biopsy for  fibroadenoma in 1998.   SOCIAL HISTORY:  Works for [employer] for [employer].   She comes for attempted laparoscopic Roux-en-Y gastrojejunostomy bypass for  control of her morbid obesity.   PREOPERATIVE LABORATORIES:  Hemoglobin 12.7, hematocrit 37, white blood  count 5200.  Sodium 136, potassium 4.2, chloride 102, CO2 27, glucose 105,  BUN 15, creatinine 0.8, total protein 7.4, albumin 4.0.   The patient was taken to the operating room on the day of admission where  she underwent a laparoscopic Roux-en-Y gastrojejunostomy (this was placed  retrocolic, retrogastric) and an esophagogastroscopy.   Postoperatively she did well the night of surgery.  The following morning  she was having some left-sided pain but she had her first day postoperative  upper GI Gastrografin swallow which showed a leak apparently coming from the  gastric pouch pulling up into the left subdiaphragmatic area.   She was taken back to the  operating room on the first postoperative day  where she underwent a laparoscopic exploration.  I actually was unable to  see the leak from outside.  When I endoscoped her I could see where material  was leaving the stomach at the apex of the staple line up toward the gastric  cardia/angle of hiss.  I then oversewed this area with a single Vicryl  suture, placed a number 24 gastrostomy tube laparoscopically, placed two  drains, and repeat endoscopy showed no evidence of leak.   This prolonged her hospital course.  On the first postoperative day after  the closure of the leak her hemoglobin was 10, hematocrit 32, white count  10,300.  She was ambulated.  We kept her n.p.o. for five days and she was  covered with cefotetan which actually was substituted with _____ she was in  the hospital.   A repeat _______ on June 14, 2002, which was postoperative day number  five from her reexploration, showed no evidence of leak.  She was then  started on clear liquids.  Her right abdominal drain was removed and then on  the 26th which was  the eighth day from her original day, the seventh day  postoperative from her reexploration, she was ready for discharge.  I  removed her other drain.  She was given liquid Roxicet for pain.  She  already had diet instructions as far as Glucerna and Ensure per [employer].  She was to do no lifting of greater than 10 pounds for at least  a week to 10 days.  She had appointment to see me back in about a week's  time.  Her discharge condition was good.                                               Sandria Bales. Ezzard Standing, M.D.    DHN/MEDQ  D:  06/30/2002  T:  06/30/2002  Job:  [id number]   cc:   Gordy Savers, M.D. [facility]  [address] [place]  [place] [address]  Fax: 1   Vilinda Flake, Ph.D.

## 2010-12-07 NOTE — Op Note (Signed)
NAME:  Leslie [name], Leslie [name] NO.:  [id number]   MEDICAL RECORD NO.:  [id number]                   PATIENT TYPE:  INP   LOCATION:  0472                                 FACILITY:  [facility]   PHYSICIAN:  Sandria Bales. Ezzard Standing, M.D.               DATE OF BIRTH:  1950-11-14   DATE OF PROCEDURE:  DATE OF DISCHARGE:                                 OPERATIVE REPORT   PREOPERATIVE DIAGNOSIS:  Leak from proximal anastomosis.   POSTOPERATIVE DIAGNOSIS:  Leak from stomach pouch, left lateral staple line.   PROCEDURES:  1. Laparoscopic exploration with oversewing of gastric staple line  with     Vicryl.  2. Laparoscopic gastrostomy with a #24 gastrostomy tube.  3. Placement of two Blake drains, one in the left upper quadrant an  one in     the right upper quadrant.  4. Esophagogastroscopy.   SURGEON:  Sandria Bales. Ezzard Standing, M.D. Valorie Roosevelt is Dr. Monia Pouch.   FIRST ASSISTANTS:  Lorne Skeens. Johna Sheriff, M.D. and Vikki Ports,  M.D.   ANESTHESIA:  General endotracheal anesthesia.   ESTIMATED BLOOD LOSS:  50 cc.   DRAINS:  Two Blake drains.   INDICATIONS FOR PROCEDURE:  The patient is a 60 year old white female who on  June 08, 2002, had a laparoscopic roux-en-Y gastrojejunostomy which was  retrocolic, retrogastric for morbid obesity. She did well last evening and  had a white blood count about 12,000 this morning, but went down for a  swallow done by Dr. Ronney Asters, which showed  a small leak which tapered out  of what looked like the proximal of the stomach. It was hard to tell exactly  where it was, but it looked like kind of lateral and high.   We repositioned our trocars. All were put in the day before except for the  10 mm in the right lower quadrant, so that means we put a liver retractor in  the upper abdomen, we put a 5 mm lateral left upper quadrant, a 12 mm  lateral left mid subcostal, a 12 mm almost mid abdomen, a 12 mm right  midline and a 12 mm  right mid subcostal.   We re-exposed the stomach first and went back and found the suture line.  Actually things looked very good from  the outside. We saw a little bit of  fibrinous debris laterally but no  obvious contamination or gross  abnormality.   We then went up and  endoscoped the patient. We passed the Olympus flexible  endoscope down he mouth without difficulty into her stomach remnant.  However, when entering the pouch, I could see the anastomosis looked good.  The staple line looked good except at the very lateral tail which  would  have been the most left lateral extent of the staple line. There was a  little pinhole which was probably not much more than 2 or 3 mm,  and we could  see debris which was kind of tracking down this hole. We thought we had  found the defect of where her leak was.   We then went back in the belly. I put in a figure-of-eight 2-0 Vicryl suture  over the area of the loop and re-endoscoped and saw no evidence of leak  despite irrigating and putting the stomach under a fair amount of pressure.  We then irrigated out the abdominal cavity. We placed a Blake drain in the  left upper quadrant and the Blake drain in the right upper quadrant. Both  drains lay kind of behind the stomach anterior to the anastomosis.   I then made a gastrotomy tube laparoscopically through this passing a #1 PDS  suture in 4 quadrants around the stomach along the greater curvature, then  doing a pursestring with a 2-0 Vicryl suture, then placing a 24 gastrostomy  tube into  the open stomach. The balloon was blown up to 15 cc. The  pursestring was then cinched down. The whole stomach was brought to the  anterior abdominal wall. We had also put closes at each incision which were  then tied down after the abdomen had been desufflated.   I irrigated each wound with Betadine and then used skin staples on the skin.  Dr. Cyndie Chime placed a right internal jugular for me at the  beginning of  the case. She was on Mefoxin. This was actually a substitute for cefotetan  as my order; will continue postoperatively.   The patient tolerated the procedure well. Her blood pressure and heart rate  stayed stable. She was transferred to the recovery room in good condition.  Sponge and needle count were correct at the end of the case.                                                 Sandria Bales. Ezzard Standing, M.D.    DHN/MEDQ  D:  06/09/2002  T:  06/09/2002  Job:  [id number]

## 2010-12-07 NOTE — Op Note (Signed)
NAME:  Leslie [name], Leslie [name] NO.:  [id number]   MEDICAL RECORD NO.:  [id number]                   PATIENT TYPE:  INP   LOCATION:  0472                                 FACILITY:  [id number]   PHYSICIAN:  Sandria Bales. Ezzard Standing, M.D.               DATE OF BIRTH:  12/04/1950   DATE OF PROCEDURE:  06/08/2002  DATE OF DISCHARGE:                                 OPERATIVE REPORT   CCS NUMBER:  [id number]   PREOPERATIVE DIAGNOSIS:  Morbid obesity with a body mass index of  approximately 45-46.   POSTOPERATIVE DIAGNOSIS:  Morbid obesity with a body mass index of  approximately 45-46.   PROCEDURE:  Laparoscopic Roux-en-Y gastrojejunostomy with  esophagogastrostomy.   SURGEON:  Sandria Bales. Ezzard Standing, M.D.   PROCTOR:  Baldemar Friday, M.D.   FIRST ASSISTANTS:  1. Vikki Ports, M.D.  2. Thornton Park. Daphine Deutscher, M.D.   ANESTHESIA:  General endotracheal.   ESTIMATED BLOOD LOSS:  100 cc.   DRAINS LEFT IN:  66 [place] Blake drain in the left upper quadrant.   INDICATIONS:  The patient is a 60 year old white female whose BMI is  approximately 60.  She has been unable to control her weight by diet.  She  now comes for attempted laparoscopic Roux-en-Y gastric bypass.  She  understands both the indications of the procedure and also the extensive  risks and complications of which a permit she has signed and reviewed by  herself and myself.   DESCRIPTION OF PROCEDURE:  She presents to the operating room where she  underwent a general endotracheal anesthesia.  She had been on a diet for two  weeks preoperatively and a bowel prep two days preoperatively.  She was  given 1 g of Cefotetan prior to the procedure and 500 mg subcu Heparin two  hours before and had PAS stockings in placed.   The patient was placed in the supine position with her arms out to her side.  Her heels and ankles were padded.  She had a Foley catheter placed.  Her  abdomen was prepped with Betadine solution and  sterilely draped.   We started with a trocar in the left upper quadrant using a hook to grab the  fascia and placed a Veress needle into the abdominal cavity, insufflated to  about 1/4 liters of CO2.  Then using the Visiport got to the abdominal  cavity.  We had to use a total of seven ports.  There was a 10 mm toward the  right of midline about the level of the umbilicus.  There were two left  subcostal ports; one was a 5 mm, one was a 12 mm.  There was a central port  which was about 20 cm below the xiphoid which was about 12 mm.  There was  one port to the right of midline and one port to the left of midline which  were both  12 mm and there was the retractor port in the upper abdomen.  First, the incision was taken down.  The adhesions from her prior open  cholecystectomy.  The adhesions came down nicely.  There was no bowel caught  up in this.  She did have a little bit of the stomach tented up but we did  not try taking a lot of that down.  We then turned our attention to the  omentum which also had to be freed up down to the pelvis.  She had about a 5  cm area attached to the lower abdominal wall, which again I could not see  any scar above this.  This was attached.  We made area of 35-40 cm, divided  the bowel between a white load of the Endo-GIA stapler.  A second load was  fired for the mesentery and I took the mesentery down further with a  harmonic scalpel.  A Penrose was placed on the proximal end of the distal  small bowel and was brought up approximately 100 cm.  I then did a side-to-  side jejunostomy.  First I used a holding suture to hold the small bowel  together.  I then used the white load of Endo-GIA of 40 as a single firing  through the bowel and then closed the lumen with a running Vicryl suture.   I then closed the mesenteric defect below the small bowel with a running 2-0  silk suture.  I then went up to the mesentery of the transverse colon, found  the ligament  of Treitz with immediate left and anterior to the abdominal  wall fat and passed the small bowel Penrose drain up through the mesenteric  defect for a retrocolic pass.  We then brought the omentum down.  I went up  and identified the stomach, first toward the lesser curvature of the  stomach.  I first went up to the angle of His angle or right beside the left  crus and tried to open a little hole there so I could come around behind it.  On the second vessel down the stomach about 4-5 cm I developed a plane  posterior to the stomach.  Then using firings of the blue Endo-GIA stapler,  fired transversely first and then went up to make a tube for four additional  firings for a total of five firings for the stomach pouch.  The stomach  pouch was about 20-30 cm, and was filled with 20-30 cc.  An Ewald tube was  passed down the esophagus protecting the esophagus during the procedure.  There was really not bleeding at all from the suture line, from the staple  line.  We did have to go and find the small bowel.  It had actually opened  into the lesser sac along the greater omentum and then actually I went below  the omentum and I pushed it back up and retrieved the small bowel again. I  tried to make sure that this was not twisted.  The small bowel was brought  up through the stomach where we did a posterior running 2-0 silk stomach to  jejunal layer and we opened both the whole wall in stomach, the wall in the  jejunum, and fired the 40 stapler through this as a single fire.  We closed  the entry wall of the gastrojejunostomy into a second layer over this 2-0  running suture of the gastric jejunostomy so it actually had a double layer  of posterior and a double layer anteriorly.   Then Dr. Daphine Deutscher went above and passed the endoscope without difficulty down  to the esophagus.  He will dictate this portion of the operation, but he was able to visualize inside the lumen of the remaining stomach.  He  has found  the anastomosis.  There was no active bleeding.  There was a patency of at  least 2 to 2.5 opening to the jejunum and we pushed the stomach under water  and there was no obvious air leak.   The stomach was then irrigated.  That area was aspirated out.  We went right  below the transverse colon and closed the mesenteric defect.  I sewed the  gastric lumen to the ileal lumen and closed the mesentery both medially and  laterally using silk suture.   I then reinspected the jejunal-jejunal duodenal anastomosis and this  appeared viable without any kinking.  The omentum was returned to a normal  location.  I put a drain in the left upper quadrant.  I used endoclose in  each of my 12 mm ports, in the 5 mm port and at where the liver retractor  was placed, and the 10 mm in the right lateral abdomen.  I did not put any  kind of closure device.   The patient tolerated the procedure well.  She did have a low urine output  during the procedure and was given plenty of fluid but had no pressure  problems.  She tolerated the procedure well and was transferred to the  recovery room in good condition.  Sponge and needle counts were correct at  the end of the case.  I did close each of the port sites with a 0 Vicryl  suture with a 5-0 Vicryl subcuticular suture and then steri-stripped the  wounds.                                               Sandria Bales. Ezzard Standing, M.D.    DHN/MEDQ  D:  06/08/2002  T:  06/09/2002  Job:  [id number]   cc:   Gordy Savers, M.D. [facility]  [address] [place]  [place] [address]  Fax: 1   Vilinda Flake, Ph.D.

## 2010-12-07 NOTE — Op Note (Signed)
   NAME:  Leslie [name], Leslie [name] NO.:  [id number]   MEDICAL RECORD NO.:  [id number]                   PATIENT TYPE:  INP   LOCATION:  0472                                 FACILITY:  [facility]   PHYSICIAN:  Thornton Park. Daphine Deutscher, M.D.             DATE OF BIRTH:  Apr 30, 1951   DATE OF PROCEDURE:  06/08/2002  DATE OF DISCHARGE:                                 OPERATIVE REPORT   PROCEDURE:  Upper endoscopy.   ENDOSCOPIST:  Thornton Park. Daphine Deutscher, M.D.   DESCRIPTION OF PROCEDURE:  The patient was undergoing laparoscopic Roux-en-Y  gastric bypass.  The upper endoscope was inserted per ora without  difficulty.  Under endoscopic visualization, the esophagus was cannulated.  The scope was then inserted to approximately 40 cm to the GE junction where  we entered into the small pouch.  This was visualized, and pictures were  taken.  Concomitantly the area was insufflated and the anastomosis was  submersed under water.  There was no evidence of  a leak.  The small gastric  pouch was then decompressed by suctioning the air out and the endoscope  withdrawn.  Pictures taken demonstrate the intact stapled anastomosis and we  could visualize the small intestine distally.  The patient had the remaining  portion of the procedure performed by Dr. Ezzard Standing.  This will be dictated in  separate operative note.                                                Thornton Park Daphine Deutscher, M.D.    MBM/MEDQ  D:  06/08/2002  T:  06/09/2002  Job:  [id number]

## 2010-12-10 ENCOUNTER — Telehealth: Payer: Self-pay

## 2010-12-10 MED ORDER — AMPHETAMINE-DEXTROAMPHET ER 30 MG PO CP24: 30.0000 mg | ORAL_CAPSULE | ORAL | Status: DC

## 2010-12-10 NOTE — Telephone Encounter (Signed)
Spoke with pt - rx's will be ready for pick up after 4pm. Change rx to XR.

## 2010-12-10 NOTE — Telephone Encounter (Signed)
Patient is calling back concerning Adderall

## 2010-12-10 NOTE — Telephone Encounter (Signed)
OK to RF XR

## 2010-12-10 NOTE — Telephone Encounter (Signed)
Pt called requesting refill - states she had been on XR in the past - but last time fill with regular - I checked centricity - XR was removed 2010 - and was filled with 10mg  regular since then.  Please advise -

## 2011-03-12 ENCOUNTER — Telehealth: Payer: Self-pay | Admitting: *Deleted

## 2011-03-12 NOTE — Telephone Encounter (Signed)
Please advise 

## 2011-03-12 NOTE — Telephone Encounter (Signed)
ok 

## 2011-03-12 NOTE — Telephone Encounter (Signed)
Requesting refills on adderall

## 2011-03-13 MED ORDER — AMPHETAMINE-DEXTROAMPHET ER 30 MG PO CP24: 30.0000 mg | ORAL_CAPSULE | ORAL | Status: DC

## 2011-03-13 NOTE — Telephone Encounter (Signed)
Spoke with pt - rx's ready for pick up

## 2011-03-26 ENCOUNTER — Encounter: Payer: Self-pay | Admitting: Internal Medicine

## 2011-03-26 ENCOUNTER — Ambulatory Visit (INDEPENDENT_AMBULATORY_CARE_PROVIDER_SITE_OTHER): Payer: BC Managed Care – PPO | Admitting: Internal Medicine

## 2011-03-26 DIAGNOSIS — J309 Allergic rhinitis, unspecified: Secondary | ICD-10-CM

## 2011-03-26 DIAGNOSIS — I10 Essential (primary) hypertension: Secondary | ICD-10-CM

## 2011-03-26 MED ORDER — BENZONATATE 100 MG PO CAPS: 100.0000 mg | ORAL_CAPSULE | Freq: Three times a day (TID) | ORAL | Status: DC | PRN

## 2011-03-26 MED ORDER — AMPHETAMINE-DEXTROAMPHETAMINE 10 MG PO TABS: 10.0000 mg | ORAL_TABLET | Freq: Every day | ORAL | Status: DC

## 2011-03-26 NOTE — Patient Instructions (Addendum)
Get plenty of rest, Drink lots of  clear liquids, and use Tylenol or ibuprofen for fever and discomfort.    Mucinex  DM one twice daily  Use Nasonex daily  Take a calcium supplement, plus 1500-2000  units of vitamin D

## 2011-03-26 NOTE — Progress Notes (Signed)
  Subjective:    Patient ID: Leslie [name], female    DOB: 1950/08/22, 60 y.o.   MRN: [id number]  HPI  is a 60 year old patient who is seen today for a five-day history of largely nonproductive cough she otherwise feels quite well no wheezing chest pain shortness of breath or fever;  she is being followed for a narcotic addiction.  She has ADHD and is requesting a refill on short acting Adderall.    Review of Systems  Constitutional: Negative.   HENT: Positive for congestion. Negative for hearing loss, sore throat, rhinorrhea, dental problem, sinus pressure and tinnitus.   Eyes: Negative for pain, discharge and visual disturbance.  Respiratory: Positive for cough. Negative for shortness of breath.   Cardiovascular: Negative for chest pain, palpitations and leg swelling.  Gastrointestinal: Negative for nausea, vomiting, abdominal pain, diarrhea, constipation, blood in stool and abdominal distention.  Genitourinary: Negative for dysuria, urgency, frequency, hematuria, flank pain, vaginal bleeding, vaginal discharge, difficulty urinating, vaginal pain and pelvic pain.  Musculoskeletal: Negative for joint swelling, arthralgias and gait problem.  Skin: Negative for rash.  Neurological: Negative for dizziness, syncope, speech difficulty, weakness, numbness and headaches.  Hematological: Negative for adenopathy.  Psychiatric/Behavioral: Negative for behavioral problems, dysphoric mood and agitation. The patient is not nervous/anxious.        Objective:   Physical Exam  Constitutional: She is oriented to person, place, and time. She appears well-developed and well-nourished.  HENT:  Head: Normocephalic.  Right Ear: External ear normal.  Left Ear: External ear normal.  Mouth/Throat: Oropharynx is clear and moist.  Eyes: Conjunctivae and EOM are normal. Pupils are equal, round, and reactive to light.  Neck: Normal range of motion. Neck supple. No thyromegaly present.  Cardiovascular:  Normal rate, regular rhythm, normal heart sounds and intact distal pulses.   Pulmonary/Chest: Effort normal and breath sounds normal. No respiratory distress. She has no wheezes. She has no rales.  Abdominal: Soft. Bowel sounds are normal. She exhibits no mass. There is no tenderness.  Musculoskeletal: Normal range of motion.  Lymphadenopathy:    She has no cervical adenopathy.  Neurological: She is alert and oriented to person, place, and time.  Skin: Skin is warm and dry. No rash noted.  Psychiatric: She has a normal mood and affect. Her behavior is normal.          Assessment & Plan:   Viral URI. Was given a prescription for Tessalon. Mucinex DM also encouraged ADHD stable History of substance abuse

## 2011-04-12 ENCOUNTER — Telehealth: Payer: Self-pay | Admitting: Internal Medicine

## 2011-04-12 NOTE — Telephone Encounter (Signed)
Faxed ok back to [facility]

## 2011-04-12 NOTE — Telephone Encounter (Signed)
Going to [facility] and she has refills for Adderall. However, it is due to be refill tomorrow. Requesting a early refill for today .please advise [facility].

## 2011-05-02 ENCOUNTER — Ambulatory Visit (INDEPENDENT_AMBULATORY_CARE_PROVIDER_SITE_OTHER): Payer: BC Managed Care – PPO | Admitting: Internal Medicine

## 2011-05-02 ENCOUNTER — Ambulatory Visit (INDEPENDENT_AMBULATORY_CARE_PROVIDER_SITE_OTHER)
Admission: RE | Admit: 2011-05-02 | Discharge: 2011-05-02 | Disposition: A | Discharge status: Home / Self Care | Payer: BC Managed Care – PPO | Source: Ambulatory Visit | Attending: Internal Medicine | Admitting: Internal Medicine

## 2011-05-02 ENCOUNTER — Other Ambulatory Visit: Payer: Self-pay | Admitting: *Deleted

## 2011-05-02 ENCOUNTER — Other Ambulatory Visit: Payer: Self-pay | Admitting: Internal Medicine

## 2011-05-02 ENCOUNTER — Encounter: Payer: Self-pay | Admitting: Internal Medicine

## 2011-05-02 DIAGNOSIS — J069 Acute upper respiratory infection, unspecified: Secondary | ICD-10-CM

## 2011-05-02 DIAGNOSIS — F1910 Other psychoactive substance abuse, uncomplicated: Secondary | ICD-10-CM

## 2011-05-02 MED ORDER — AZITHROMYCIN 250 MG PO TABS: ORAL_TABLET | ORAL | Status: AC

## 2011-05-02 MED ORDER — AZITHROMYCIN 250 MG PO TABS: ORAL_TABLET | ORAL | Status: DC

## 2011-05-02 MED ORDER — PSEUDOEPHEDRINE-GUAIFENESIN ER 60-600 MG PO TB12: 1.0000 | ORAL_TABLET | Freq: Two times a day (BID) | ORAL | Status: DC

## 2011-05-02 NOTE — Telephone Encounter (Signed)
Pt's meds were sent to the wrong pharmacy.  Resent.

## 2011-05-02 NOTE — Progress Notes (Signed)
  Subjective:    Patient ID: Leslie [name], female    DOB: 1951-04-28, 60 y.o.   MRN: [id number]  HPI  60 year old patient who is seen today complaining of persistent cough. She was seen here 5 weeks ago and cough persists it is minimally productive. She is concerned about whooping cough. Denies any fever wheezing shortness of breath or chest pain. She has treated hypertension    Review of Systems  Constitutional: Negative.   HENT: Negative for hearing loss, congestion, sore throat, rhinorrhea, dental problem, sinus pressure and tinnitus.   Eyes: Negative for pain, discharge and visual disturbance.  Respiratory: Positive for cough. Negative for shortness of breath.   Cardiovascular: Negative for chest pain, palpitations and leg swelling.  Gastrointestinal: Negative for nausea, vomiting, abdominal pain, diarrhea, constipation, blood in stool and abdominal distention.  Genitourinary: Negative for dysuria, urgency, frequency, hematuria, flank pain, vaginal bleeding, vaginal discharge, difficulty urinating, vaginal pain and pelvic pain.  Musculoskeletal: Negative for joint swelling, arthralgias and gait problem.  Skin: Negative for rash.  Neurological: Negative for dizziness, syncope, speech difficulty, weakness, numbness and headaches.  Hematological: Negative for adenopathy.  Psychiatric/Behavioral: Negative for behavioral problems, dysphoric mood and agitation. The patient is not nervous/anxious.        Objective:   Physical Exam  Constitutional: She is oriented to person, place, and time. She appears well-developed and well-nourished.  HENT:  Head: Normocephalic.  Right Ear: External ear normal.  Left Ear: External ear normal.  Mouth/Throat: Oropharynx is clear and moist.  Eyes: Conjunctivae and EOM are normal. Pupils are equal, round, and reactive to light.  Neck: Normal range of motion. Neck supple. No thyromegaly present.  Cardiovascular: Normal rate, regular rhythm, normal  heart sounds and intact distal pulses.   Pulmonary/Chest: Effort normal. She has rales.       Scattered rhonchi and bibasilar rales  O2 saturation 97  Abdominal: Soft. Bowel sounds are normal. She exhibits no mass. There is no tenderness.  Musculoskeletal: Normal range of motion.  Lymphadenopathy:    She has no cervical adenopathy.  Neurological: She is alert and oriented to person, place, and time.  Skin: Skin is warm and dry. No rash noted.  Psychiatric: She has a normal mood and affect. Her behavior is normal.          Assessment & Plan:   URI. Rule out pneumonia. A chest x-ray will be taken. She was treated with expectorants and treated also with azithromycin.

## 2011-05-02 NOTE — Progress Notes (Signed)
Quick Note:  Spoke with pt - informed Results normal ______

## 2011-05-02 NOTE — Patient Instructions (Signed)
Get plenty of rest, Drink lots of  clear liquids, and use Tylenol or ibuprofen for fever and discomfort.    Mucinex DM twice daily  Take your antibiotic as prescribed until ALL of it is gone, but stop if you develop a rash, swelling, or any side effects of the medication.  Contact our office as soon as possible if  there are side effects of the medication.  Chest x-ray as discussed

## 2011-05-15 ENCOUNTER — Telehealth: Payer: Self-pay | Admitting: Internal Medicine

## 2011-05-15 NOTE — Telephone Encounter (Signed)
Pt called and said that she finished abx last wk, but is still coughing and congestion in chest. Pt wants to know if she needs another round of abx or does she need to come back in for ov? [facility] on [address].

## 2011-05-15 NOTE — Telephone Encounter (Signed)
Please advise 

## 2011-05-16 MED ORDER — BENZONATATE 100 MG PO CAPS: 100.0000 mg | ORAL_CAPSULE | Freq: Three times a day (TID) | ORAL | Status: AC | PRN

## 2011-05-16 NOTE — Telephone Encounter (Signed)
Attempt to call- VM - LMTCB if questions , gave dr. Vernon Prey instructions . Tessalon added and sent. KIK

## 2011-05-16 NOTE — Telephone Encounter (Signed)
Please notify patient that the cough of tracheobronchitis may last for weeks. Her chest x-ray is normal. She does not require further antibiotics. Schedule  return office visit if patient is worse ;  please call in a prescription for generic Tessalon Perles 200 mg #30 one 3 times daily

## 2011-05-22 ENCOUNTER — Other Ambulatory Visit (INDEPENDENT_AMBULATORY_CARE_PROVIDER_SITE_OTHER): Payer: BC Managed Care – PPO

## 2011-05-22 DIAGNOSIS — Z0000 Encounter for general adult medical examination without abnormal findings: Secondary | ICD-10-CM

## 2011-05-22 LAB — POCT URINALYSIS DIPSTICK
Ketones, UA: NEGATIVE
Protein, UA: NEGATIVE
Urobilinogen, UA: 0.2
pH, UA: 6

## 2011-05-22 LAB — LIPID PANEL
HDL: 62.7 mg/dL (ref >39.00)
LDL Cholesterol: 94 mg/dL (ref 0–99)
Total CHOL/HDL Ratio: 3
VLDL: 18.2 mg/dL (ref 0.0–40.0)

## 2011-05-22 LAB — HEPATIC FUNCTION PANEL
ALT: 11 U/L (ref 0–35)
AST: 16 U/L (ref 0–37)
Alkaline Phosphatase: 99 U/L (ref 39–117)
Bilirubin, Direct: 0 mg/dL (ref 0.0–0.3)
Total Bilirubin: 0.3 mg/dL (ref 0.3–1.2)

## 2011-05-22 LAB — CBC WITH DIFFERENTIAL/PLATELET
Basophils Absolute: 0 10*3/uL (ref 0.0–0.1)
Eosinophils Relative: 5.2 % — ABNORMAL HIGH (ref 0.0–5.0)
HCT: 30.9 % — ABNORMAL LOW (ref 36.0–46.0)
Lymphocytes Relative: 29.9 % (ref 12.0–46.0)
Monocytes Relative: 7 % (ref 3.0–12.0)
Platelets: 282 10*3/uL (ref 150.0–400.0)
RDW: 16.9 % — ABNORMAL HIGH (ref 11.5–14.6)
WBC: 4.2 10*3/uL — ABNORMAL LOW (ref 4.5–10.5)

## 2011-05-22 LAB — BASIC METABOLIC PANEL
BUN: 21 mg/dL (ref 6–23)
CO2: 27 mEq/L (ref 19–32)
Calcium: 8.8 mg/dL (ref 8.4–10.5)
Chloride: 102 mEq/L (ref 96–112)
Creatinine, Ser: 1.2 mg/dL (ref 0.4–1.2)
Glucose, Bld: 94 mg/dL (ref 70–99)
Sodium: 140 mEq/L (ref 135–145)

## 2011-05-30 NOTE — Progress Notes (Signed)
Quick Note:  Spoke with tp -informed of dr. Vernon Prey instructions - has cpx on the books in a few weeks - will f/u then ______

## 2011-06-17 ENCOUNTER — Encounter: Payer: Self-pay | Admitting: Internal Medicine

## 2011-06-17 ENCOUNTER — Ambulatory Visit (INDEPENDENT_AMBULATORY_CARE_PROVIDER_SITE_OTHER): Payer: BC Managed Care – PPO | Admitting: Internal Medicine

## 2011-06-17 DIAGNOSIS — Z23 Encounter for immunization: Secondary | ICD-10-CM

## 2011-06-17 DIAGNOSIS — I10 Essential (primary) hypertension: Secondary | ICD-10-CM

## 2011-06-17 DIAGNOSIS — Z860100 Personal history of colon polyps, unspecified: Secondary | ICD-10-CM

## 2011-06-17 DIAGNOSIS — Z0000 Encounter for general adult medical examination without abnormal findings: Secondary | ICD-10-CM

## 2011-06-17 DIAGNOSIS — F988 Other specified behavioral and emotional disorders with onset usually occurring in childhood and adolescence: Secondary | ICD-10-CM

## 2011-06-17 MED ORDER — HYDROCHLOROTHIAZIDE 25 MG PO TABS: 25.0000 mg | ORAL_TABLET | Freq: Every day | ORAL | Status: DC

## 2011-06-17 MED ORDER — FLUOXETINE HCL 40 MG PO CAPS: 40.0000 mg | ORAL_CAPSULE | Freq: Two times a day (BID) | ORAL | Status: DC

## 2011-06-17 MED ORDER — AMPHETAMINE-DEXTROAMPHET ER 30 MG PO CP24: 30.0000 mg | ORAL_CAPSULE | ORAL | Status: DC

## 2011-06-17 MED ORDER — AMPHETAMINE-DEXTROAMPHETAMINE 10 MG PO TABS: 10.0000 mg | ORAL_TABLET | Freq: Every day | ORAL | Status: DC

## 2011-06-17 MED ORDER — ZOLPIDEM TARTRATE 10 MG PO TABS: 10.0000 mg | ORAL_TABLET | Freq: Every evening | ORAL | Status: DC | PRN

## 2011-06-17 MED ORDER — TRIAMCINOLONE ACETONIDE(NASAL) 55 MCG/ACT NA INHA: 2.0000 | Freq: Every day | NASAL | Status: DC

## 2011-06-17 NOTE — Progress Notes (Signed)
Subjective:    Patient ID: Leslie [name], female    DOB: 1951/07/08, 60 y.o.   MRN: [id number]  HPI  60 year old patient who is seen today for a wellness exam. There has been some modest weight gain with poor eating choices. In general doing quite well  Wt Readings from Last 3 Encounters:  06/17/11 208 lb (94.348 kg)  05/02/11 200 lb (90.719 kg)  03/26/11 200 lb (90.719 kg)    Current Allergies:   SULFAMETHOXAZOLE (SULFAMETHOXAZOLE)   Past Medical History:   Reviewed history from 09/14/2007 and no changes required:  Allergic rhinitis  Hypertension  obesity  Colonic polyps, hx of  ADD  Substance abuse  Past Surgical History:  Reviewed history from 08/03/2007 and no changes required:  Cholecystectomy  2004, kidney donation  breast biopsy 1998  2003, laparoscopic gastric jejunostomy  colonoscopy November 2006   Family History:  Reviewed history from 08/03/2007 and no changes required:  father died age 8 complications of COPD  mother died age 71 Status post CABG history congestive heart failure  One brother history of CAD ; s/p stent One sister, type 2 diabetes, status post renal transplant   Social History:  Reviewed history and no changes required:  Married   Past Medical History  Diagnosis Date  . Allergy   . Hypertension   . Obesity     post gastric bypass  . Colon polyps   . ADD (attention deficit disorder)   . Left knee pain     chronic  . Narcotic addiction   . Vitamin D deficiency     History   Social History  . Marital Status: Single    Spouse Name: N/A    Number of Children: N/A  . Years of Education: N/A   Occupational History  . Not on file.   Social History Main Topics  . Smoking status: Former Smoker    Quit date: 07/22/1992  . Smokeless tobacco: Never Used  . Alcohol Use: Yes  . Drug Use: No  . Sexually Active: Not on file   Other Topics Concern  . Not on file   Social History Narrative  . No narrative on file     Past Surgical History  Procedure Date  . Gastric bypass   . Cholecystectomy   . Kidney donation 2004  . Brain surgery     breast biopsy    Family History  Problem Relation Age of Onset  . Heart disease Mother     post CABG history of CHF  . COPD Father   . Diabetes Sister   . Hypertension Sister     post renal transplant  . Heart disease Brother     CAD    Allergies  Allergen Reactions  . Sulfamethoxazole     REACTION: unspecified    Current Outpatient Prescriptions on File Prior to Visit  Medication Sig Dispense Refill  . amphetamine-dextroamphetamine (ADDERALL, 10MG ,) 10 MG tablet Take 1 tablet (10 mg total) by mouth daily.  30 tablet  0  . buprenorphine-naloxone (SUBOXONE) 8-2 MG SUBL Place 1 tablet under the tongue 2 (two) times daily.        Marland Kitchen FLUoxetine (PROZAC) 40 MG capsule Take 40 mg by mouth 2 (two) times daily.        . hydrochlorothiazide 25 MG tablet Take 25 mg by mouth daily.        . pseudoephedrine-guaifenesin (MUCINEX D) 60-600 MG per tablet Take 1 tablet by mouth every 12 (twelve) hours.  30 tablet  prn  . triamcinolone (NASACORT) 55 MCG/ACT nasal inhaler Place 2 sprays into the nose daily.        Marland Kitchen zolpidem (AMBIEN) 10 MG tablet Take 10 mg by mouth at bedtime as needed.          BP 128/80  Pulse 74  Temp(Src) 98.3 F (36.8 C) (Oral)  Resp 18  Ht 5' 4.5" (1.638 m)  Wt 208 lb (94.348 kg)  BMI 35.15 kg/m2  SpO2 99%    Review of Systems  Constitutional: Negative for fever, appetite change, fatigue and unexpected weight change.  HENT: Negative for hearing loss, ear pain, nosebleeds, congestion, sore throat, mouth sores, trouble swallowing, neck stiffness, dental problem, voice change, sinus pressure and tinnitus.   Eyes: Negative for photophobia, pain, redness and visual disturbance.  Respiratory: Negative for cough, chest tightness and shortness of breath.   Cardiovascular: Negative for chest pain, palpitations and leg swelling.   Gastrointestinal: Negative for nausea, vomiting, abdominal pain, diarrhea, constipation, blood in stool, abdominal distention and rectal pain.  Genitourinary: Negative for dysuria, urgency, frequency, hematuria, flank pain, vaginal bleeding, vaginal discharge, difficulty urinating, genital sores, vaginal pain, menstrual problem and pelvic pain.  Musculoskeletal: Negative for back pain and arthralgias.  Skin: Negative for rash.  Neurological: Negative for dizziness, syncope, speech difficulty, weakness, light-headedness, numbness and headaches.  Hematological: Negative for adenopathy. Does not bruise/bleed easily.  Psychiatric/Behavioral: Negative for suicidal ideas, behavioral problems, self-injury, dysphoric mood and agitation. The patient is not nervous/anxious.        Objective:   Physical Exam  Constitutional: She is oriented to person, place, and time. She appears well-developed and well-nourished.  HENT:  Head: Normocephalic and atraumatic.  Right Ear: External ear normal.  Left Ear: External ear normal.  Mouth/Throat: Oropharynx is clear and moist.  Eyes: Conjunctivae and EOM are normal.  Neck: Normal range of motion. Neck supple. No JVD present. No thyromegaly present.  Cardiovascular: Normal rate, regular rhythm, normal heart sounds and intact distal pulses.   No murmur heard. Pulmonary/Chest: Effort normal and breath sounds normal. She has no wheezes. She has no rales.  Abdominal: Soft. Bowel sounds are normal. She exhibits no distension and no mass. There is no tenderness. There is no rebound and no guarding.  Musculoskeletal: Normal range of motion. She exhibits no edema and no tenderness.  Neurological: She is alert and oriented to person, place, and time. She has normal reflexes. No cranial nerve deficit. She exhibits normal muscle tone. Coordination normal.  Skin: Skin is warm and dry. No rash noted.  Psychiatric: She has a normal mood and affect. Her behavior is normal.           Assessment & Plan:   Preventive health examination Exogenous obesity ADD. Prescriptions dispensed  Weight loss exercise restricted salt diet all encouraged. We'll recheck in 6 months

## 2011-06-17 NOTE — Patient Instructions (Signed)
Limit your sodium (Salt) intake    It is important that you exercise regularly, at least 20 minutes 3 to 4 times per week.  If you develop chest pain or shortness of breath seek  medical attention.  You need to lose weight.  Consider a lower calorie diet and regular exercise.  Return in 6 months for follow-up   

## 2011-07-02 ENCOUNTER — Other Ambulatory Visit: Payer: Self-pay | Admitting: Internal Medicine

## 2011-07-23 HISTORY — PX: EYE SURGERY: SHX253

## 2011-08-05 ENCOUNTER — Telehealth: Payer: Self-pay | Admitting: *Deleted

## 2011-08-05 MED ORDER — AMPHETAMINE-DEXTROAMPHETAMINE 10 MG PO TABS: 10.0000 mg | ORAL_TABLET | Freq: Every day | ORAL | Status: DC

## 2011-08-05 NOTE — Telephone Encounter (Signed)
Jan. rx done, pt aware ready for pick up

## 2011-08-05 NOTE — Telephone Encounter (Signed)
Pt states she lost her prescription for Adderal for January, and is asking for a refill to be written so she can pick up.

## 2011-08-05 NOTE — Telephone Encounter (Signed)
Ok  notify that there will be no further early refills

## 2011-08-05 NOTE — Telephone Encounter (Signed)
Please advise 

## 2011-08-06 ENCOUNTER — Telehealth: Payer: Self-pay | Admitting: *Deleted

## 2011-08-06 MED ORDER — AMPHETAMINE-DEXTROAMPHET ER 30 MG PO CP24: 30.0000 mg | ORAL_CAPSULE | ORAL | Status: DC

## 2011-08-06 NOTE — Telephone Encounter (Signed)
Spoke with pt - needed 30mg  XR - not plain 10mg  - new rx printed and will be ready for pick up after 4pm

## 2011-08-06 NOTE — Telephone Encounter (Signed)
Pt is calling back to talk to Selena Batten re: her Adderall prescriptions, please.  I sent the phone call to you so you can see what she is asking for?

## 2011-09-09 ENCOUNTER — Other Ambulatory Visit: Payer: Self-pay | Admitting: Internal Medicine

## 2011-09-09 NOTE — Telephone Encounter (Signed)
Pt needs new rxs generic adderall xr 30 mg and generic adderall 10 mg. Pt is out

## 2011-09-10 MED ORDER — AMPHETAMINE-DEXTROAMPHETAMINE 10 MG PO TABS: 10.0000 mg | ORAL_TABLET | Freq: Every day | ORAL | Status: DC

## 2011-09-10 MED ORDER — AMPHETAMINE-DEXTROAMPHET ER 30 MG PO CP24: 30.0000 mg | ORAL_CAPSULE | ORAL | Status: DC

## 2011-09-10 NOTE — Telephone Encounter (Signed)
Attempt to call - VM - LMTCB if questions - rx's ready for pick up 

## 2011-10-14 ENCOUNTER — Other Ambulatory Visit: Payer: Self-pay | Admitting: *Deleted

## 2011-10-14 NOTE — Telephone Encounter (Signed)
Pt needs refills on Adderal, please.

## 2011-10-14 NOTE — Telephone Encounter (Signed)
hollie has located in the med log that she did pick up rx's in feb as requested. KIK

## 2011-10-14 NOTE — Telephone Encounter (Signed)
Attempt to call VM - rx's for 30mg  and 10mg  were done on 09/10/11 (3 each ) with # 1 for fill now, #2 do not fill prior to 10/08/11 , # 3- do not fill rior to 11/08/11. Pt should have at least 1 rx avilb for fill -of each - but not before 11/08/11

## 2012-01-21 ENCOUNTER — Telehealth: Payer: Self-pay | Admitting: Internal Medicine

## 2012-01-21 NOTE — Telephone Encounter (Signed)
No further Rx for adderal from this office

## 2012-01-21 NOTE — Telephone Encounter (Signed)
Pt called req refill of amphetamine-dextroamphetamine (ADDERALL XR) 30 MG 24 hr capsule and Adderall 10 mg. Pt is leaving town tomorrow and is req to pick up scripts today.

## 2012-01-21 NOTE — Telephone Encounter (Signed)
i had recv'd a call from [facility]'s r/t adderall fills - they informed me that she had gotten rx's from both Dr. Mikey Kirschner and Saul Fordyce NP (at [facility] [phone]) and it looked like she was filling at 2 different pharmacies - [facility] and [facility].  I called [facility] - spoke with carol -(kelly out of office) to confirmed med rx'd from there - Saul Fordyce last saw 01/02/12 , last wrotten rx for adderall XR 30mg  on 12/03/11 bid , # 60 .  We last saw 05/2011 cpx , last written 09/10/11 , given 3 rx's each XR30mg  and 30mg  #30 0RF as usual.  Please advise

## 2012-01-22 NOTE — Telephone Encounter (Signed)
Spoke with pt - states she lost rx - so she had Saul Fordyce write her 1 - i exlpained that this was controlled substance and it was rx'd different from the way dr. Amador Cunas had her on it - she will have to be seen to discus with him - transferred to scheduling to make rov

## 2012-01-27 ENCOUNTER — Encounter: Payer: Self-pay | Admitting: Internal Medicine

## 2012-01-27 ENCOUNTER — Ambulatory Visit (INDEPENDENT_AMBULATORY_CARE_PROVIDER_SITE_OTHER): Payer: BC Managed Care – PPO | Admitting: Internal Medicine

## 2012-01-27 VITALS — BP 112/70 | Temp 98.4°F | Wt 204.0 lb

## 2012-01-27 DIAGNOSIS — E669 Obesity, unspecified: Secondary | ICD-10-CM

## 2012-01-27 DIAGNOSIS — F1910 Other psychoactive substance abuse, uncomplicated: Secondary | ICD-10-CM

## 2012-01-27 DIAGNOSIS — F988 Other specified behavioral and emotional disorders with onset usually occurring in childhood and adolescence: Secondary | ICD-10-CM

## 2012-01-27 DIAGNOSIS — I10 Essential (primary) hypertension: Secondary | ICD-10-CM

## 2012-01-27 NOTE — Progress Notes (Signed)
  Subjective:    Patient ID: Leslie [name], female    DOB: [birth year], 61 y.o.   MRN: [id number]  HPI  61 y/o f/u HTN;  H/o ADHD followed at [facility] due to h/o substance abuse- has also been receiving Rx for adderall at this facility    Review of Systems  Constitutional: Negative.   HENT: Negative for hearing loss, congestion, sore throat, rhinorrhea, dental problem, sinus pressure and tinnitus.   Eyes: Negative for pain, discharge and visual disturbance.  Respiratory: Negative for cough and shortness of breath.   Cardiovascular: Negative for chest pain, palpitations and leg swelling.  Gastrointestinal: Negative for nausea, vomiting, abdominal pain, diarrhea, constipation, blood in stool and abdominal distention.  Genitourinary: Negative for dysuria, urgency, frequency, hematuria, flank pain, vaginal bleeding, vaginal discharge, difficulty urinating, vaginal pain and pelvic pain.  Musculoskeletal: Negative for joint swelling, arthralgias and gait problem.  Skin: Negative for rash.  Neurological: Negative for dizziness, syncope, speech difficulty, weakness, numbness and headaches.  Hematological: Negative for adenopathy.  Psychiatric/Behavioral: Negative for behavioral problems, dysphoric mood and agitation. The patient is not nervous/anxious.        Objective:   Physical Exam  Constitutional: She is oriented to person, place, and time. She appears well-developed and well-nourished.  HENT:  Head: Normocephalic.  Right Ear: External ear normal.  Left Ear: External ear normal.  Mouth/Throat: Oropharynx is clear and moist.  Eyes: Conjunctivae and EOM are normal. Pupils are equal, round, and reactive to light.  Neck: Normal range of motion. Neck supple. No thyromegaly present.  Cardiovascular: Normal rate, regular rhythm, normal heart sounds and intact distal pulses.   Pulmonary/Chest: Effort normal and breath sounds normal.  Abdominal: Soft. Bowel sounds are  normal. She exhibits no mass. There is no tenderness.  Musculoskeletal: Normal range of motion.  Lymphadenopathy:    She has no cervical adenopathy.  Neurological: She is alert and oriented to person, place, and time.  Skin: Skin is warm and dry. No rash noted.  Psychiatric: She has a normal mood and affect. Her behavior is normal.          Assessment & Plan:  HTN- controlled ADHD/Substance abuse- all controlled meds by Saul Fordyce at [facility] 6 mon

## 2012-01-27 NOTE — Patient Instructions (Addendum)
Please check your blood pressure on a regular basis.  If it is consistently greater than 150/90, please make an office appointment.  Return in 6 months for follow-up

## 2012-02-27 ENCOUNTER — Ambulatory Visit (INDEPENDENT_AMBULATORY_CARE_PROVIDER_SITE_OTHER): Payer: BC Managed Care – PPO | Admitting: Family Medicine

## 2012-02-27 ENCOUNTER — Encounter: Payer: Self-pay | Admitting: Family Medicine

## 2012-02-27 VITALS — BP 112/72 | HR 75 | Temp 98.2°F | Wt 200.0 lb

## 2012-02-27 DIAGNOSIS — B029 Zoster without complications: Secondary | ICD-10-CM

## 2012-02-27 MED ORDER — VALACYCLOVIR HCL 1 G PO TABS: 1000.0000 mg | ORAL_TABLET | Freq: Three times a day (TID) | ORAL | Status: DC

## 2012-02-27 MED ORDER — METHYLPREDNISOLONE 4 MG PO KIT: PACK | ORAL | Status: AC

## 2012-02-27 NOTE — Progress Notes (Signed)
  Subjective:    Patient ID: Leslie [name], female    DOB: Nov 21, 1950, 61 y.o.   MRN: [id number]  HPI Here for 4 days of burning and tingling on the right side of her head and neck. Yesterday she noted a rash in this area.    Review of Systems  Constitutional: Negative.   Eyes: Negative.   Skin: Positive for rash.       Objective:   Physical Exam  Constitutional: She appears well-developed and well-nourished.  HENT:  Head: Normocephalic and atraumatic.  Right Ear: External ear normal.  Left Ear: External ear normal.  Nose: Nose normal.  Mouth/Throat: Oropharynx is clear and moist.  Eyes: Conjunctivae are normal. Pupils are equal, round, and reactive to light.  Lymphadenopathy:    She has no cervical adenopathy.  Skin:       Scattered red papular or vesicular rash on the right side of the vertex of the head, the right occipital scalp, and the right neck and shoulder          Assessment & Plan:  Shingles. Treat with Valtrex and steroids

## 2012-07-13 ENCOUNTER — Other Ambulatory Visit: Payer: BC Managed Care – PPO

## 2012-07-20 ENCOUNTER — Encounter: Payer: BC Managed Care – PPO | Admitting: Internal Medicine

## 2012-07-20 ENCOUNTER — Other Ambulatory Visit: Payer: Self-pay | Admitting: *Deleted

## 2012-07-20 MED ORDER — HYDROCHLOROTHIAZIDE 25 MG PO TABS: 25.0000 mg | ORAL_TABLET | Freq: Every day | ORAL | Status: DC

## 2012-07-20 NOTE — Telephone Encounter (Signed)
Received fax refill for HCTZ. Rx refill faxed to pharmacy.

## 2012-07-22 HISTORY — PX: EYE SURGERY: SHX253

## 2012-07-28 ENCOUNTER — Other Ambulatory Visit: Payer: BC Managed Care – PPO

## 2012-08-03 ENCOUNTER — Encounter: Payer: BC Managed Care – PPO | Admitting: Internal Medicine

## 2012-09-08 ENCOUNTER — Other Ambulatory Visit (INDEPENDENT_AMBULATORY_CARE_PROVIDER_SITE_OTHER): Payer: BC Managed Care – PPO

## 2012-09-08 DIAGNOSIS — Z0000 Encounter for general adult medical examination without abnormal findings: Secondary | ICD-10-CM

## 2012-09-08 LAB — POCT URINALYSIS DIPSTICK
Blood, UA: NEGATIVE
Glucose, UA: NEGATIVE
Ketones, UA: NEGATIVE
Spec Grav, UA: 1.02
Urobilinogen, UA: 1

## 2012-09-08 LAB — CBC WITH DIFFERENTIAL/PLATELET
Basophils Absolute: 0 10*3/uL (ref 0.0–0.1)
Basophils Relative: 0.9 % (ref 0.0–3.0)
Hemoglobin: 10.1 g/dL — ABNORMAL LOW (ref 12.0–15.0)
Lymphocytes Relative: 32.7 % (ref 12.0–46.0)
Monocytes Relative: 6.7 % (ref 3.0–12.0)
Neutro Abs: 2.7 10*3/uL (ref 1.4–7.7)
Neutrophils Relative %: 55.1 % (ref 43.0–77.0)
RBC: 4.59 Mil/uL (ref 3.87–5.11)

## 2012-09-08 LAB — LIPID PANEL
Cholesterol: 193 mg/dL (ref 0–200)
HDL: 60.2 mg/dL (ref >39.00)
LDL Cholesterol: 108 mg/dL — ABNORMAL HIGH (ref 0–99)
Total CHOL/HDL Ratio: 3
Triglycerides: 123 mg/dL (ref 0.0–149.0)
VLDL: 24.6 mg/dL (ref 0.0–40.0)

## 2012-09-08 LAB — BASIC METABOLIC PANEL
Calcium: 9.5 mg/dL (ref 8.4–10.5)
GFR: 44.48 mL/min — ABNORMAL LOW (ref >60.00)
Sodium: 142 mEq/L (ref 135–145)

## 2012-09-08 LAB — HEPATIC FUNCTION PANEL
AST: 19 U/L (ref 0–37)
Albumin: 3.9 g/dL (ref 3.5–5.2)
Alkaline Phosphatase: 90 U/L (ref 39–117)
Bilirubin, Direct: 0 mg/dL (ref 0.0–0.3)

## 2012-09-15 ENCOUNTER — Ambulatory Visit (INDEPENDENT_AMBULATORY_CARE_PROVIDER_SITE_OTHER): Payer: BC Managed Care – PPO | Admitting: Internal Medicine

## 2012-09-15 ENCOUNTER — Encounter: Payer: Self-pay | Admitting: Internal Medicine

## 2012-09-15 VITALS — BP 130/80 | HR 98 | Temp 97.8°F | Resp 18 | Ht 65.0 in | Wt 203.0 lb

## 2012-09-15 DIAGNOSIS — Z0000 Encounter for general adult medical examination without abnormal findings: Secondary | ICD-10-CM

## 2012-09-15 DIAGNOSIS — E669 Obesity, unspecified: Secondary | ICD-10-CM

## 2012-09-15 DIAGNOSIS — Z9889 Other specified postprocedural states: Secondary | ICD-10-CM

## 2012-09-15 DIAGNOSIS — I10 Essential (primary) hypertension: Secondary | ICD-10-CM

## 2012-09-15 MED ORDER — HYDROCHLOROTHIAZIDE 25 MG PO TABS: 25.0000 mg | ORAL_TABLET | Freq: Every day | ORAL | Status: DC

## 2012-09-15 MED ORDER — DULOXETINE HCL 60 MG PO CPEP: 60.0000 mg | ORAL_CAPSULE | Freq: Every day | ORAL | Status: DC

## 2012-09-15 MED ORDER — ZOLPIDEM TARTRATE 10 MG PO TABS: 10.0000 mg | ORAL_TABLET | Freq: Every evening | ORAL | Status: DC | PRN

## 2012-09-15 MED ORDER — BUPROPION HCL 100 MG PO TABS: 100.0000 mg | ORAL_TABLET | Freq: Every day | ORAL | Status: DC

## 2012-09-15 MED ORDER — ESTROGENS, CONJUGATED 0.625 MG/GM VA CREA: TOPICAL_CREAM | Freq: Every day | VAGINAL | Status: DC

## 2012-09-15 MED ORDER — AMPHETAMINE-DEXTROAMPHET ER 30 MG PO CP24: 30.0000 mg | ORAL_CAPSULE | ORAL | Status: DC

## 2012-09-15 NOTE — Patient Instructions (Signed)
Limit your sodium (Salt) intake  Please check your blood pressure on a regular basis.  If it is consistently greater than 150/90, please make an office appointment.    It is important that you exercise regularly, at least 20 minutes 3 to 4 times per week.  If you develop chest pain or shortness of breath seek  medical attention.  Return in one year for follow-up  

## 2012-09-15 NOTE — Progress Notes (Signed)
Subjective:    Patient ID: Leslie [name], female    DOB: Aug 22, 1950, 62 y.o.   MRN: [id number]  Hypertension Pertinent negatives include no chest pain, headaches, palpitations or shortness of breath.  62 -year-old patient who is seen today for a wellness exam. There has been some modest weight gain with poor eating choices. In general doing quite well.  Recently retired  [employer] from Last 3 Encounters:  09/15/12 203 lb (92.08 kg)  02/27/12 200 lb (90.719 kg)  01/27/12 204 lb (92.534 kg)    Current Allergies:   SULFAMETHOXAZOLE (SULFAMETHOXAZOLE)   Past Medical History:   Reviewed history from 09/14/2007 and no changes required:  Allergic rhinitis  Hypertension  obesity  Colonic polyps, hx of  ADD  Substance abuse  Past Surgical History:  Reviewed history from 08/03/2007 and no changes required:  Cholecystectomy  2004, kidney donation  breast biopsy 1998  2003, laparoscopic gastric jejunostomy  colonoscopy November 2006   Family History:  Reviewed history from 08/03/2007 and no changes required:  father died age 66 complications of COPD  mother died age 56 Status post CABG history congestive heart failure  One brother history of CAD ; s/p stent One sister, type 2 diabetes, status post renal transplant   Social History:  Reviewed history and no changes required:  Married   Past Medical History  Diagnosis Date  . Allergy   . Hypertension   . Obesity     post gastric bypass  . Colon polyps   . ADD (attention deficit disorder)   . Left knee pain     chronic  . Narcotic addiction   . Vitamin D deficiency     History   Social History  . Marital Status: Single    Spouse Name: N/A    Number of Children: N/A  . Years of Education: N/A   Occupational History  . Not on file.   Social History Main Topics  . Smoking status: Former Smoker    Quit date: 07/22/1992  . Smokeless tobacco: Never Used  . Alcohol Use: Yes     Comment: once every 6  months  . Drug Use: No  . Sexually Active: Not on file   Other Topics Concern  . Not on file   Social History Narrative  . No narrative on file    Past Surgical History  Procedure Laterality Date  . Gastric bypass    . Cholecystectomy    . Kidney donation  2004  . Brain surgery      breast biopsy    Family History  Problem Relation Age of Onset  . Heart disease Mother     post CABG history of CHF  . COPD Father   . Diabetes Sister   . Hypertension Sister     post renal transplant  . Heart disease Brother     CAD    Allergies  Allergen Reactions  . Sulfamethoxazole     REACTION: unspecified    Current Outpatient Prescriptions on File Prior to Visit  Medication Sig Dispense Refill  . amphetamine-dextroamphetamine (ADDERALL XR) 30 MG 24 hr capsule Take 30 mg by mouth every morning.      . buprenorphine-naloxone (SUBOXONE) 8-2 MG SUBL Place 1 tablet under the tongue 2 (two) times daily.        . hydrochlorothiazide (HYDRODIURIL) 25 MG tablet Take 1 tablet (25 mg total) by mouth daily.  90 tablet  3  . triamcinolone (NASACORT) 55 MCG/ACT nasal inhaler Place  2 sprays into the nose daily.  1 Inhaler  6  . zolpidem (AMBIEN) 10 MG tablet Take 1 tablet (10 mg total) by mouth at bedtime as needed.  30 tablet  2  . pseudoephedrine-guaifenesin (MUCINEX D) 60-600 MG per tablet Take 1 tablet by mouth every 12 (twelve) hours.  30 tablet  prn   No current facility-administered medications on file prior to visit.    BP 130/80  Pulse 98  Temp(Src) 97.8 F (36.6 C) (Oral)  Resp 18  Ht 5\' 5"  (1.651 m)  Wt 203 lb (92.08 kg)  BMI 33.78 kg/m2  SpO2 99%    Review of Systems  Constitutional: Negative for fever, appetite change, fatigue and unexpected weight change.  HENT: Negative for hearing loss, ear pain, nosebleeds, congestion, sore throat, mouth sores, trouble swallowing, neck stiffness, dental problem, voice change, sinus pressure and tinnitus.   Eyes: Negative for  photophobia, pain, redness and visual disturbance.  Respiratory: Negative for cough, chest tightness and shortness of breath.   Cardiovascular: Negative for chest pain, palpitations and leg swelling.  Gastrointestinal: Negative for nausea, vomiting, abdominal pain, diarrhea, constipation, blood in stool, abdominal distention and rectal pain.  Genitourinary: Negative for dysuria, urgency, frequency, hematuria, flank pain, vaginal bleeding, vaginal discharge, difficulty urinating, genital sores, vaginal pain, menstrual problem and pelvic pain.  Musculoskeletal: Negative for back pain and arthralgias.  Skin: Negative for rash.  Neurological: Negative for dizziness, syncope, speech difficulty, weakness, light-headedness, numbness and headaches.  Hematological: Negative for adenopathy. Does not bruise/bleed easily.  Psychiatric/Behavioral: Negative for suicidal ideas, behavioral problems, self-injury, dysphoric mood and agitation. The patient is not nervous/anxious.        Objective:   Physical Exam  Constitutional: She is oriented to person, place, and time. She appears well-developed and well-nourished.  HENT:  Head: Normocephalic and atraumatic.  Right Ear: External ear normal.  Left Ear: External ear normal.  Mouth/Throat: Oropharynx is clear and moist.  Eyes: Conjunctivae and EOM are normal.  Neck: Normal range of motion. Neck supple. No JVD present. No thyromegaly present.  Cardiovascular: Normal rate, regular rhythm, normal heart sounds and intact distal pulses.   No murmur heard. Pulmonary/Chest: Effort normal and breath sounds normal. She has no wheezes. She has no rales.  Abdominal: Soft. Bowel sounds are normal. She exhibits no distension and no mass. There is no tenderness. There is no rebound and no guarding.  Musculoskeletal: Normal range of motion. She exhibits no edema and no tenderness.  Neurological: She is alert and oriented to person, place, and time. She has normal  reflexes. No cranial nerve deficit. She exhibits normal muscle tone. Coordination normal.  Skin: Skin is warm and dry. No rash noted.  Psychiatric: She has a normal mood and affect. Her behavior is normal.          Assessment & Plan:   Preventive health examination Exogenous obesity ADD. Prescriptions dispensed  Weight loss exercise restricted salt diet all encouraged. We'll recheck in 6 months

## 2013-01-11 ENCOUNTER — Other Ambulatory Visit: Payer: Self-pay | Admitting: Internal Medicine

## 2013-04-02 IMAGING — CR DG CHEST 2V
2 series · 2 of 2 positions shown · non-contrast
Comparison: None.

CLINICAL DATA: Cough, chest pressure.

CHEST - 2 VIEW

[view not recorded (1 of 2)]
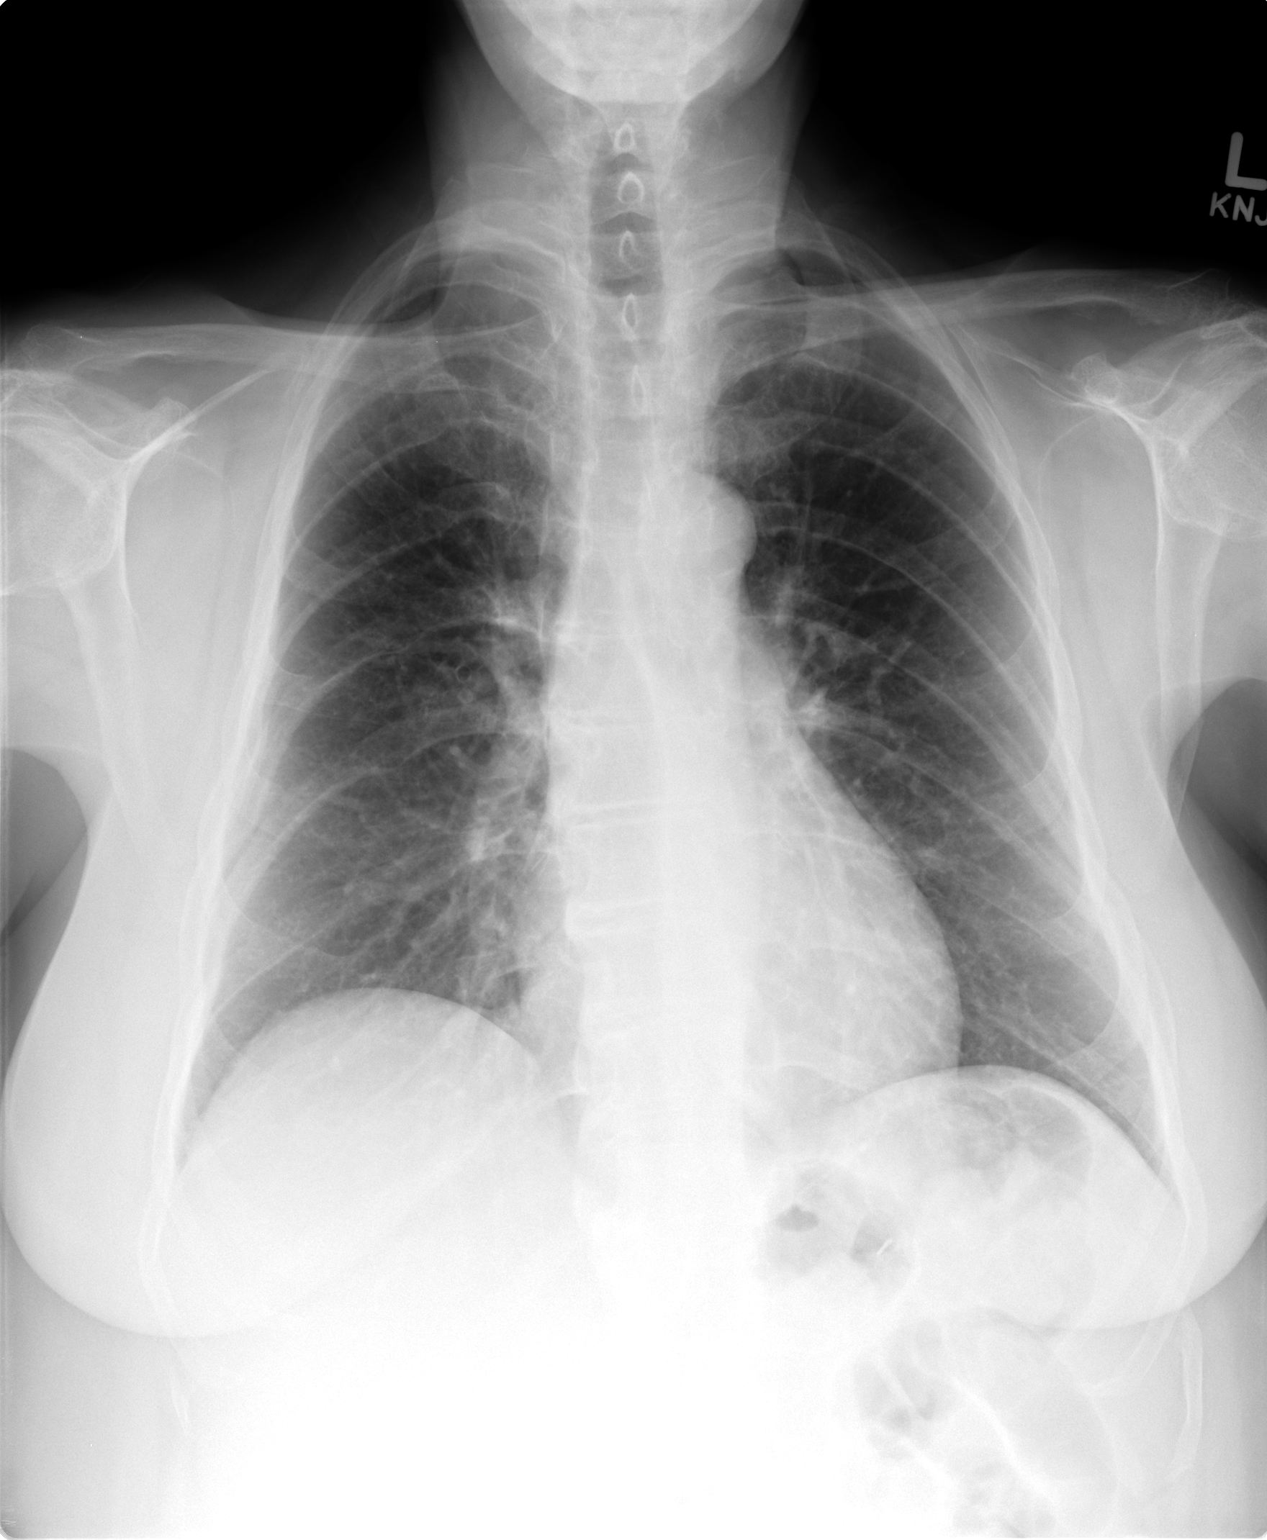

[view not recorded (2 of 2)]
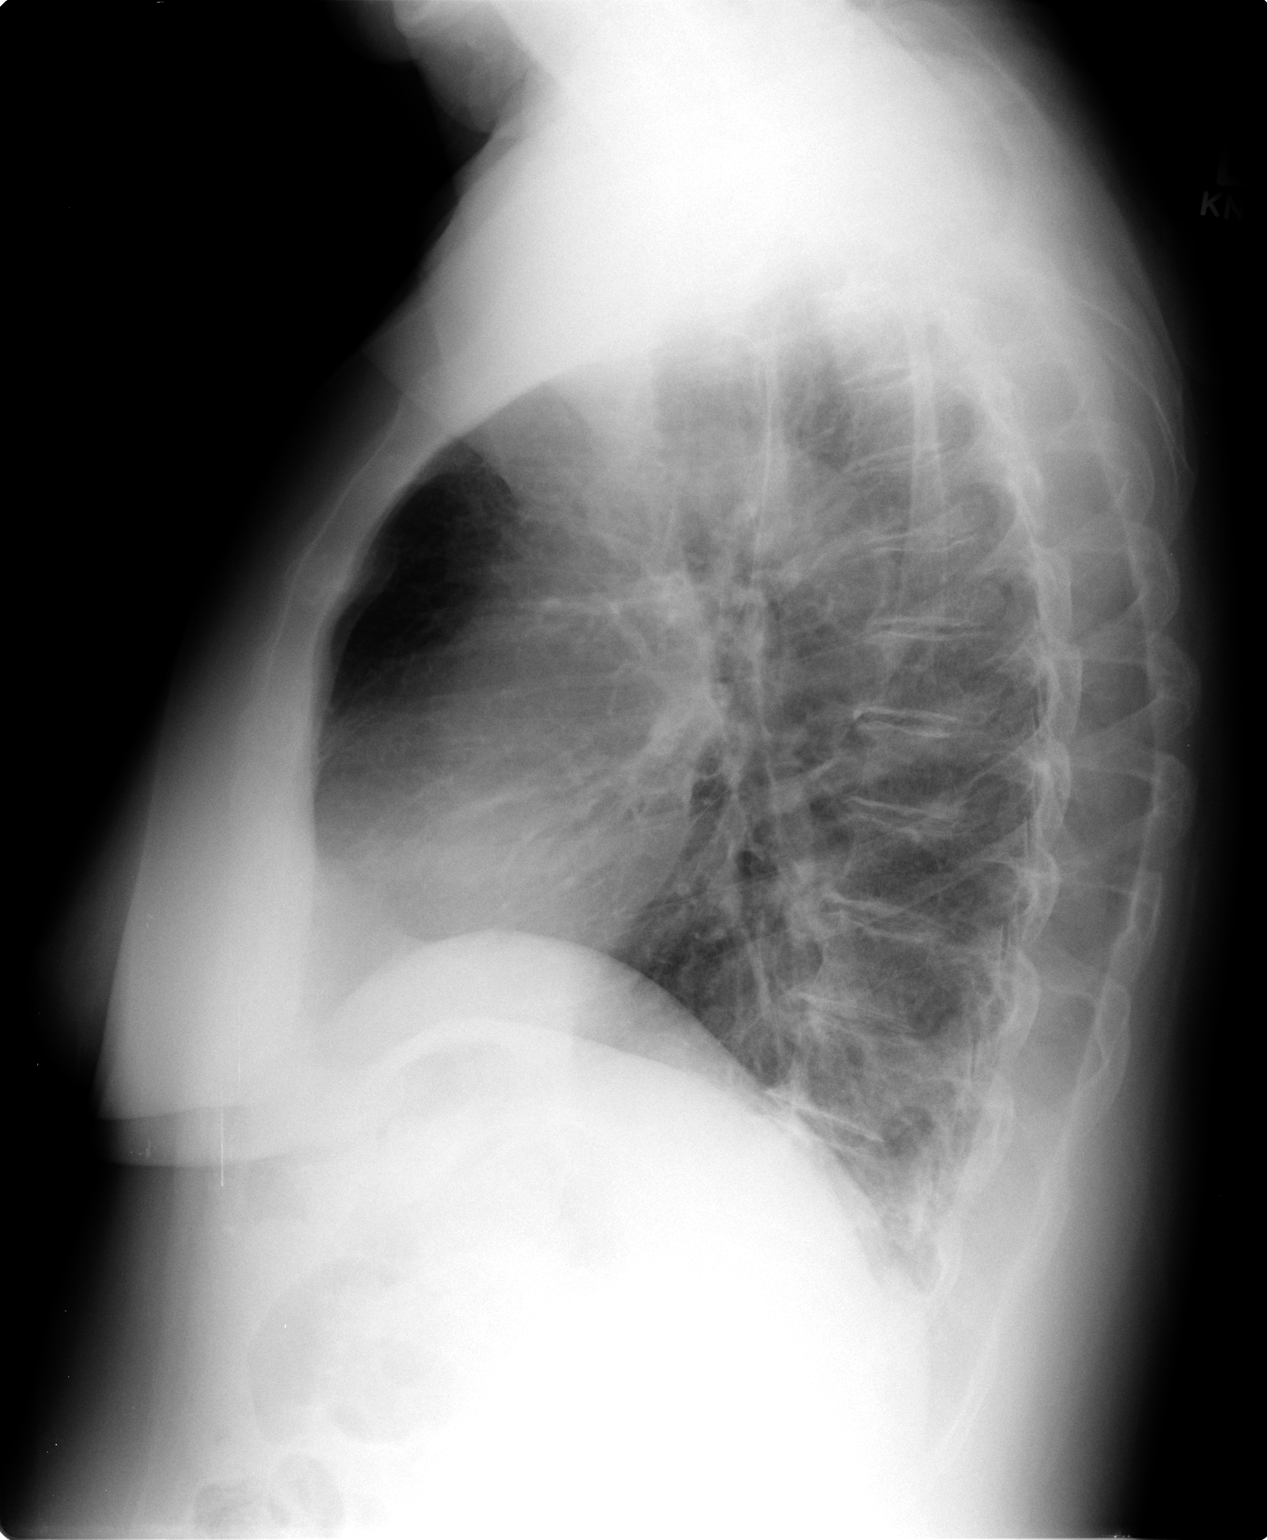

[2 of 2 positions shown; findings below may reference images not displayed]

FINDINGS: Heart and mediastinal contours are within normal limits.
No focal opacities or effusions.  No acute bony abnormality.
IMPRESSION: No active cardiopulmonary disease.

## 2013-04-11 ENCOUNTER — Other Ambulatory Visit: Payer: Self-pay | Admitting: Internal Medicine

## 2013-07-09 ENCOUNTER — Other Ambulatory Visit: Payer: Self-pay | Admitting: Internal Medicine

## 2013-07-10 ENCOUNTER — Other Ambulatory Visit: Payer: Self-pay | Admitting: Internal Medicine

## 2013-10-06 ENCOUNTER — Other Ambulatory Visit: Payer: Self-pay | Admitting: Internal Medicine

## 2013-12-23 ENCOUNTER — Ambulatory Visit: Payer: BC Managed Care – PPO | Admitting: Psychiatry

## 2014-01-04 ENCOUNTER — Ambulatory Visit: Payer: BC Managed Care – PPO | Admitting: Psychiatry

## 2014-01-27 ENCOUNTER — Other Ambulatory Visit: Payer: Self-pay | Admitting: Internal Medicine

## 2014-03-02 ENCOUNTER — Ambulatory Visit (INDEPENDENT_AMBULATORY_CARE_PROVIDER_SITE_OTHER): Payer: BC Managed Care – PPO | Admitting: Internal Medicine

## 2014-03-02 ENCOUNTER — Telehealth: Payer: Self-pay | Admitting: Internal Medicine

## 2014-03-02 ENCOUNTER — Encounter: Payer: Self-pay | Admitting: *Deleted

## 2014-03-02 ENCOUNTER — Encounter: Payer: Self-pay | Admitting: Internal Medicine

## 2014-03-02 VITALS — BP 128/80 | HR 85 | Temp 97.9°F | Resp 20 | Ht 64.5 in | Wt 197.0 lb

## 2014-03-02 DIAGNOSIS — I10 Essential (primary) hypertension: Secondary | ICD-10-CM

## 2014-03-02 DIAGNOSIS — F1910 Other psychoactive substance abuse, uncomplicated: Secondary | ICD-10-CM

## 2014-03-02 DIAGNOSIS — F988 Other specified behavioral and emotional disorders with onset usually occurring in childhood and adolescence: Secondary | ICD-10-CM

## 2014-03-02 MED ORDER — AMPHETAMINE-DEXTROAMPHET ER 30 MG PO CP24: 30.0000 mg | ORAL_CAPSULE | ORAL | Status: DC

## 2014-03-02 MED ORDER — FLUOXETINE HCL 40 MG PO CAPS: 40.0000 mg | ORAL_CAPSULE | Freq: Every day | ORAL | Status: DC

## 2014-03-02 MED ORDER — ZOLPIDEM TARTRATE 10 MG PO TABS: ORAL_TABLET | ORAL | Status: DC

## 2014-03-02 NOTE — Patient Instructions (Signed)
Limit your sodium (Salt) intake  Please check your blood pressure on a regular basis.  If it is consistently greater than 150/90, please make an office appointment.    It is important that you exercise regularly, at least 20 minutes 3 to 4 times per week.  If you develop chest pain or shortness of breath seek  medical attention.  You need to lose weight.  Consider a lower calorie diet and regular exercise.  Return in 4 months for follow-up

## 2014-03-02 NOTE — Progress Notes (Signed)
Subjective:    Patient ID: Leslie [name], female    DOB: Oct 31, 1950, 63 y.o.   MRN: [id number]  HPI  63 year old patient who has not been seen about a year and a half.  She was referred for counseling.  Due to substance abuse and a Vicodin dependency.  She has done quite well.  She has a history of ADD, hypertension, obesity, and allergic rhinitis. Since her last visit here.  She is also retired.  She is quite pleased with her progress. She also has a history of insomnia and uses Ambien 5 or 10 mg most nights She remains on Adderall for ADD. Remains on Prozac and Lamictal has been added to her regimen  Past Medical History  Diagnosis Date  . Allergy   . Hypertension   . Obesity     post gastric bypass  . Colon polyps   . ADD (attention deficit disorder)   . Left knee pain     chronic  . Narcotic addiction   . Vitamin D deficiency     History   Social History  . Marital Status: Single    Spouse Name: N/A    Number of Children: N/A  . Years of Education: N/A   Occupational History  . Not on file.   Social History Main Topics  . Smoking status: Former Smoker    Quit date: 07/22/1992  . Smokeless tobacco: Never Used  . Alcohol Use: Yes     Comment: once every 6 months  . Drug Use: No  . Sexual Activity: Not on file   Other Topics Concern  . Not on file   Social History Narrative  . No narrative on file    Past Surgical History  Procedure Laterality Date  . Gastric bypass    . Cholecystectomy    . Kidney donation  2004  . Brain surgery      breast biopsy    Family History  Problem Relation Age of Onset  . Heart disease Mother     post CABG history of CHF  . COPD Father   . Diabetes Sister   . Hypertension Sister     post renal transplant  . Heart disease Brother     CAD    Allergies  Allergen Reactions  . Sulfamethoxazole     REACTION: unspecified    Current Outpatient Prescriptions on File Prior to Visit  Medication Sig Dispense  Refill  . hydrochlorothiazide (HYDRODIURIL) 25 MG tablet take 1 tablet by mouth daily  90 tablet  3  . RESTASIS 0.05 % ophthalmic emulsion Place 1 drop into both eyes at bedtime.       . triamcinolone (NASACORT) 55 MCG/ACT nasal inhaler Place 2 sprays into the nose daily.  1 Inhaler  6   No current facility-administered medications on file prior to visit.    BP 128/80  Pulse 85  Temp(Src) 97.9 F (36.6 C) (Oral)  Resp 20  Ht 5' 4.5" (1.638 m)  Wt 197 lb (89.359 kg)  BMI 33.31 kg/m2  SpO2 98%     Review of Systems  Constitutional: Negative.   HENT: Negative for congestion, dental problem, hearing loss, rhinorrhea, sinus pressure, sore throat and tinnitus.   Eyes: Negative for pain, discharge and visual disturbance.  Respiratory: Negative for cough and shortness of breath.   Cardiovascular: Negative for chest pain, palpitations and leg swelling.  Gastrointestinal: Negative for nausea, vomiting, abdominal pain, diarrhea, constipation, blood in stool and abdominal distention.  Genitourinary:  Negative for dysuria, urgency, frequency, hematuria, flank pain, vaginal bleeding, vaginal discharge, difficulty urinating, vaginal pain and pelvic pain.  Musculoskeletal: Negative for arthralgias, gait problem and joint swelling.  Skin: Negative for rash.  Neurological: Negative for dizziness, syncope, speech difficulty, weakness, numbness and headaches.  Hematological: Negative for adenopathy.  Psychiatric/Behavioral: Positive for sleep disturbance. Negative for behavioral problems, dysphoric mood and agitation. The patient is hyperactive. The patient is not nervous/anxious.        Objective:   Physical Exam  Constitutional: She is oriented to person, place, and time. She appears well-developed and well-nourished.  HENT:  Head: Normocephalic.  Right Ear: External ear normal.  Left Ear: External ear normal.  Mouth/Throat: Oropharynx is clear and moist.  Eyes: Conjunctivae and EOM are  normal. Pupils are equal, round, and reactive to light.  Neck: Normal range of motion. Neck supple. No thyromegaly present.  Cardiovascular: Normal rate, regular rhythm, normal heart sounds and intact distal pulses.   Pulmonary/Chest: Effort normal and breath sounds normal.  Abdominal: Soft. Bowel sounds are normal. She exhibits no mass. There is no tenderness.  Musculoskeletal: Normal range of motion.  Lymphadenopathy:    She has no cervical adenopathy.  Neurological: She is alert and oriented to person, place, and time.  Skin: Skin is warm and dry. No rash noted.  Psychiatric: She has a normal mood and affect. Her behavior is normal.          Assessment & Plan:   Hypertension well controlled ADD.  Adderall refilled History of substance abuse, stable Insomnia.  Ambien refilled Obesity.  Weight loss encouraged  Schedule CPX

## 2014-03-02 NOTE — Telephone Encounter (Signed)
Relevant patient education assigned to patient using Emmi. ° °

## 2014-03-02 NOTE — Progress Notes (Signed)
Pre visit review using our clinic review tool, if applicable. No additional management support is needed unless otherwise documented below in the visit note. 

## 2014-04-14 ENCOUNTER — Other Ambulatory Visit (INDEPENDENT_AMBULATORY_CARE_PROVIDER_SITE_OTHER): Payer: BC Managed Care – PPO

## 2014-04-14 DIAGNOSIS — Z0000 Encounter for general adult medical examination without abnormal findings: Secondary | ICD-10-CM

## 2014-04-14 LAB — POCT URINALYSIS DIPSTICK
Bilirubin, UA: NEGATIVE
Blood, UA: NEGATIVE
GLUCOSE UA: NEGATIVE
Ketones, UA: NEGATIVE
LEUKOCYTES UA: NEGATIVE
NITRITE UA: NEGATIVE
PROTEIN UA: NEGATIVE
Spec Grav, UA: 1.01
UROBILINOGEN UA: 0.2
pH, UA: 7

## 2014-04-14 LAB — BASIC METABOLIC PANEL
BUN: 13 mg/dL (ref 6–23)
CO2: 29 meq/L (ref 19–32)
Calcium: 9.3 mg/dL (ref 8.4–10.5)
Chloride: 103 mEq/L (ref 96–112)
Creatinine, Ser: 1.1 mg/dL (ref 0.4–1.2)
GFR: 53.75 mL/min — ABNORMAL LOW (ref >60.00)
Glucose, Bld: 93 mg/dL (ref 70–99)
Potassium: 4.6 mEq/L (ref 3.5–5.1)
SODIUM: 139 meq/L (ref 135–145)

## 2014-04-14 LAB — HEPATIC FUNCTION PANEL
ALBUMIN: 3.8 g/dL (ref 3.5–5.2)
ALT: 18 U/L (ref 0–35)
AST: 23 U/L (ref 0–37)
Alkaline Phosphatase: 78 U/L (ref 39–117)
Bilirubin, Direct: 0 mg/dL (ref 0.0–0.3)
TOTAL PROTEIN: 6.6 g/dL (ref 6.0–8.3)
Total Bilirubin: 0.2 mg/dL (ref 0.2–1.2)

## 2014-04-14 LAB — CBC WITH DIFFERENTIAL/PLATELET
BASOS ABS: 0 10*3/uL (ref 0.0–0.1)
Basophils Relative: 1 % (ref 0.0–3.0)
EOS ABS: 0.2 10*3/uL (ref 0.0–0.7)
Eosinophils Relative: 4.1 % (ref 0.0–5.0)
HCT: 30 % — ABNORMAL LOW (ref 36.0–46.0)
HEMOGLOBIN: 9.3 g/dL — AB (ref 12.0–15.0)
Lymphocytes Relative: 25.4 % (ref 12.0–46.0)
Lymphs Abs: 1.1 10*3/uL (ref 0.7–4.0)
MCHC: 30.9 g/dL (ref 30.0–36.0)
MCV: 70 fl — AB (ref 78.0–100.0)
MONO ABS: 0.3 10*3/uL (ref 0.1–1.0)
Monocytes Relative: 6.5 % (ref 3.0–12.0)
NEUTROS ABS: 2.7 10*3/uL (ref 1.4–7.7)
Neutrophils Relative %: 63 % (ref 43.0–77.0)
Platelets: 341 10*3/uL (ref 150.0–400.0)
RBC: 4.29 Mil/uL (ref 3.87–5.11)
RDW: 17.5 % — ABNORMAL HIGH (ref 11.5–15.5)
WBC: 4.4 10*3/uL (ref 4.0–10.5)

## 2014-04-14 LAB — TSH: TSH: 2.33 u[IU]/mL (ref 0.35–4.50)

## 2014-04-14 LAB — LIPID PANEL
CHOLESTEROL: 186 mg/dL (ref 0–200)
HDL: 63 mg/dL (ref >39.00)
LDL Cholesterol: 107 mg/dL — ABNORMAL HIGH (ref 0–99)
NonHDL: 123
TRIGLYCERIDES: 82 mg/dL (ref 0.0–149.0)
Total CHOL/HDL Ratio: 3
VLDL: 16.4 mg/dL (ref 0.0–40.0)

## 2014-04-20 ENCOUNTER — Encounter: Payer: Self-pay | Admitting: Internal Medicine

## 2014-04-20 ENCOUNTER — Ambulatory Visit (INDEPENDENT_AMBULATORY_CARE_PROVIDER_SITE_OTHER): Payer: BC Managed Care – PPO | Admitting: Internal Medicine

## 2014-04-20 VITALS — BP 120/82 | HR 95 | Temp 97.9°F | Resp 20 | Ht 64.0 in | Wt 199.0 lb

## 2014-04-20 DIAGNOSIS — F988 Other specified behavioral and emotional disorders with onset usually occurring in childhood and adolescence: Secondary | ICD-10-CM

## 2014-04-20 DIAGNOSIS — Z0000 Encounter for general adult medical examination without abnormal findings: Secondary | ICD-10-CM

## 2014-04-20 DIAGNOSIS — I10 Essential (primary) hypertension: Secondary | ICD-10-CM

## 2014-04-20 DIAGNOSIS — Z86010 Personal history of colonic polyps: Secondary | ICD-10-CM

## 2014-04-20 DIAGNOSIS — E669 Obesity, unspecified: Secondary | ICD-10-CM

## 2014-04-20 NOTE — Progress Notes (Signed)
Subjective:    Patient ID: Leslie [name], female    DOB: 03/08/1951, 63 y.o.   MRN: [id number]  HPI   Subjective:    Patient ID: Leslie [name], female    DOB: 03/08/1951, 63 y.o.   MRN: [id number]  HPI  63 -year-old patient who is seen today for a wellness exam.  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  She continues to be [facility] and is on a low dose of Suboxone. Laboratory studies reviewed and did reveal a moderate iron deficiency anemia. She has a history of depression, which has been stable.  She has ADD, which has been treated successfully with Adderall.   Wt Readings from Last 3 Encounters:  04/20/14 199 lb (90.266 kg)  03/02/14 197 lb (89.359 kg)  09/15/12 203 lb (92.08 kg)    Current Allergies:   SULFAMETHOXAZOLE (SULFAMETHOXAZOLE)   Past Medical History:   Allergic rhinitis  Hypertension  obesity  Colonic polyps, hx of  ADD  Substance abuse  Past Surgical History:   Cholecystectomy  2004, kidney donation  breast biopsy 1998  2003, laparoscopic gastric jejunostomy  colonoscopy November 2006   Family History:   father died age 64 complications of COPD  mother died age 64 Status post CABG history congestive heart failure  One brother history of CAD ; s/p stent One sister, type 2 diabetes, status post renal transplant   Social History:  Reviewed history and no changes required:  Married   Past Medical History  Diagnosis Date  . Allergy   . Hypertension   . Obesity     post gastric bypass  . Colon polyps   . ADD (attention deficit disorder)   . Left knee pain     chronic  . Narcotic addiction   . Vitamin D deficiency     History   Social History  . Marital Status: Single    Spouse Name: N/A    Number of Children: N/A  . Years of Education: N/A   Occupational History  . Not on file.   Social History Main Topics  . Smoking status: Former  Smoker    Quit date: 07/22/1992  . Smokeless tobacco: Never Used  . Alcohol Use: Yes     Comment: once every 6 months  . Drug Use: No  . Sexual Activity: Not on file   Other Topics Concern  . Not on file   Social History Narrative  . No narrative on file    Past Surgical History  Procedure Laterality Date  . Gastric bypass    . Cholecystectomy    . Kidney donation  2004  . Brain surgery      breast biopsy    Family History  Problem Relation Age of Onset  . Heart disease Mother     post CABG history of CHF  . COPD Father   . Diabetes Sister   . Hypertension Sister     post renal transplant  . Heart disease Brother     CAD    Allergies  Allergen Reactions  . Sulfamethoxazole     REACTION: unspecified    Current Outpatient Prescriptions on File Prior to Visit  Medication Sig Dispense Refill  . amphetamine-dextroamphetamine (ADDERALL XR) 30 MG 24 hr capsule Take 1 capsule (30 mg total) by mouth every morning.  30 capsule  0  . amphetamine-dextroamphetamine (ADDERALL XR) 30 MG 24 hr capsule Take 1 capsule (  30 mg total) by mouth every morning.  30 capsule  0  . amphetamine-dextroamphetamine (ADDERALL XR) 30 MG 24 hr capsule Take 1 capsule (30 mg total) by mouth every morning.  30 capsule  0  . FLUoxetine (PROZAC) 40 MG capsule Take 1 capsule (40 mg total) by mouth daily.  90 capsule  3  . hydrochlorothiazide (HYDRODIURIL) 25 MG tablet take 1 tablet by mouth daily  90 tablet  3  . lamoTRIgine (LAMICTAL) 100 MG tablet Take 100 mg by mouth daily.      . RESTASIS 0.05 % ophthalmic emulsion Place 1 drop into both eyes at bedtime.       . triamcinolone (NASACORT) 55 MCG/ACT nasal inhaler Place 2 sprays into the nose daily.  1 Inhaler  6  . zolpidem (AMBIEN) 10 MG tablet take 1 tablet by mouth once daily at bedtime if needed for sleep  30 tablet  5   No current facility-administered medications on file prior to visit.    BP 120/82  Pulse 95  Temp(Src) 97.9 F (36.6 C)  (Oral)  Resp 20  Ht 5\' 4"  (1.626 m)  Wt 199 lb (90.266 kg)  BMI 34.14 kg/m2  SpO2 98%    Review of Systems  Constitutional: Negative for fever, appetite change, fatigue and unexpected weight change.  HENT: Negative for hearing loss, ear pain, nosebleeds, congestion, sore throat, mouth sores, trouble swallowing, neck stiffness, dental problem, voice change, sinus pressure and tinnitus.   Eyes: Negative for photophobia, pain, redness and visual disturbance.  Respiratory: Negative for cough, chest tightness and shortness of breath.   Cardiovascular: Negative for chest pain, palpitations and leg swelling.  Gastrointestinal: Negative for nausea, vomiting, abdominal pain, diarrhea, constipation, blood in stool, abdominal distention and rectal pain.  Genitourinary: Negative for dysuria, urgency, frequency, hematuria, flank pain, vaginal bleeding, vaginal discharge, difficulty urinating, genital sores, vaginal pain, menstrual problem and pelvic pain.  Musculoskeletal: Negative for back pain and arthralgias.  Skin: Negative for rash.  Neurological: Negative for dizziness, syncope, speech difficulty, weakness, light-headedness, numbness and headaches.  Hematological: Negative for adenopathy. Does not bruise/bleed easily.  Psychiatric/Behavioral: Negative for suicidal ideas, behavioral problems, self-injury, dysphoric mood and agitation. The patient is not nervous/anxious.        Objective:   Physical Exam  Constitutional: She is oriented to person, place, and time. She appears well-developed and well-nourished.  HENT:  Head: Normocephalic and atraumatic.  Right Ear: External ear normal.  Left Ear: External ear normal.  Mouth/Throat: Oropharynx is clear and moist.  Eyes: Conjunctivae and EOM are normal.  Neck: Normal range of motion. Neck supple. No JVD present. No thyromegaly present.  Cardiovascular: Normal rate, regular rhythm, normal heart sounds and intact distal pulses.   No murmur  heard. Pulmonary/Chest: Effort normal and breath sounds normal. She has no wheezes. She has no rales.  Abdominal: Soft. Bowel sounds are normal. She exhibits no distension and no mass. There is no tenderness. There is no rebound and no guarding.  Musculoskeletal: Normal range of motion. She exhibits no edema and no tenderness.  Neurological: She is alert and oriented to person, place, and time. She has normal reflexes. No cranial nerve deficit. She exhibits normal muscle tone. Coordination normal.  Skin: Skin is warm and dry. No rash noted.  Psychiatric: She has a normal mood and affect. Her behavior is normal.          Assessment & Plan:   Preventive health examination Exogenous obesity ADD. Prescriptions dispensed Substance abuse.  Followup [place] [facility].  Continue taper of Suboxone  Screening mammogram.  Encouraged Supplemental  iron therapy encouraged Recheck 6 months Low salt diet Home blood pressure monitoring  Weight loss exercise restricted salt diet all encouraged. We'll recheck in 6 months  Review of Systems    as above Objective:   Physical Exam   As above     Assessment & Plan:  As above

## 2014-04-20 NOTE — Patient Instructions (Signed)
Take an iron supplement twice daily  Limit your sodium (Salt) intake    It is important that you exercise regularly, at least 20 minutes 3 to 4 times per week.  If you develop chest pain or shortness of breath seek  medical attention.  Schedule your mammogram.Health Maintenance Adopting a healthy lifestyle and getting preventive care can go a long way to promote health and wellness. Talk with your health care provider about what schedule of regular examinations is right for you. This is a good chance for you to check in with your provider about disease prevention and staying healthy. In between checkups, there are plenty of things you can do on your own. Experts have done a lot of research about which lifestyle changes and preventive measures are most likely to keep you healthy. Ask your health care provider for more information. WEIGHT AND DIET  Eat a healthy diet  Be sure to include plenty of vegetables, fruits, low-fat dairy products, and lean protein.  Do not eat a lot of foods high in solid fats, added sugars, or salt.  Get regular exercise. This is one of the most important things you can do for your health.  Most adults should exercise for at least 150 minutes each week. The exercise should increase your heart rate and make you sweat (moderate-intensity exercise).  Most adults should also do strengthening exercises at least twice a week. This is in addition to the moderate-intensity exercise.  Maintain a healthy weight  Body mass index (BMI) is a measurement that can be used to identify possible weight problems. It estimates body fat based on height and weight. Your health care provider can help determine your BMI and help you achieve or maintain a healthy weight.  For females 81 years of age and older:   A BMI below 18.5 is considered underweight.  A BMI of 18.5 to 24.9 is normal.  A BMI of 25 to 29.9 is considered overweight.  A BMI of 30 and above is considered obese.   Watch levels of cholesterol and blood lipids  You should start having your blood tested for lipids and cholesterol at 64 years of age, then have this test every 5 years.  You may need to have your cholesterol levels checked more often if:  Your lipid or cholesterol levels are high.  You are older than 64 years of age.  You are at high risk for heart disease.  CANCER SCREENING   Lung Cancer  Lung cancer screening is recommended for adults 68-59 years old who are at high risk for lung cancer because of a history of smoking.  A yearly low-dose CT scan of the lungs is recommended for people who:  Currently smoke.  Have quit within the past 15 years.  Have at least a 30-pack-year history of smoking. A pack year is smoking an average of one pack of cigarettes a day for 1 year.  Yearly screening should continue until it has been 15 years since you quit.  Yearly screening should stop if you develop a health problem that would prevent you from having lung cancer treatment.  Breast Cancer  Practice breast self-awareness. This means understanding how your breasts normally appear and feel.  It also means doing regular breast self-exams. Let your health care provider know about any changes, no matter how small.  If you are in your 20s or 30s, you should have a clinical breast exam (CBE) by a health care provider every 1-3 years as part  as part of a regular health exam.  If you are 40 or older, have a CBE every year. Also consider having a breast X-ray (mammogram) every year.  If you have a family history of breast cancer, talk to your health care provider about genetic screening.  If you are at high risk for breast cancer, talk to your health care provider about having an MRI and a mammogram every year.  Breast cancer gene (BRCA) assessment is recommended for women who have family members with BRCA-related cancers. BRCA-related cancers  include:  Breast.  Ovarian.  Tubal.  Peritoneal cancers.  Results of the assessment will determine the need for genetic counseling and BRCA1 and BRCA2 testing. Cervical Cancer Routine pelvic examinations to screen for cervical cancer are no longer recommended for nonpregnant women who are considered low risk for cancer of the pelvic organs (ovaries, uterus, and vagina) and who do not have symptoms. A pelvic examination may be necessary if you have symptoms including those associated with pelvic infections. Ask your health care provider if a screening pelvic exam is right for you.   The Pap test is the screening test for cervical cancer for women who are considered at risk.  If you had a hysterectomy for a problem that was not cancer or a condition that could lead to cancer, then you no longer need Pap tests.  If you are older than 65 years, and you have had normal Pap tests for the past 10 years, you no longer need to have Pap tests.  If you have had past treatment for cervical cancer or a condition that could lead to cancer, you need Pap tests and screening for cancer for at least 20 years after your treatment.  If you no longer get a Pap test, assess your risk factors if they change (such as having a new sexual partner). This can affect whether you should start being screened again.  Some women have medical problems that increase their chance of getting cervical cancer. If this is the case for you, your health care provider may recommend more frequent screening and Pap tests.  The human papillomavirus (HPV) test is another test that may be used for cervical cancer screening. The HPV test looks for the virus that can cause cell changes in the cervix. The cells collected during the Pap test can be tested for HPV.  The HPV test can be used to screen women 30 years of age and older. Getting tested for HPV can extend the interval between normal Pap tests from three to five years.  An HPV  test also should be used to screen women of any age who have unclear Pap test results.  After 64 years of age, women should have HPV testing as often as Pap tests.  Colorectal Cancer  This type of cancer can be detected and often prevented.  Routine colorectal cancer screening usually begins at 64 years of age and continues through 64 years of age.  Your health care provider may recommend screening at an earlier age if you have risk factors for colon cancer.  Your health care provider may also recommend using home test kits to check for hidden blood in the stool.  A small camera at the end of a tube can be used to examine your colon directly (sigmoidoscopy or colonoscopy). This is done to check for the earliest forms of colorectal cancer.  Routine screening usually begins at age 50.  Direct examination of the colon should be repeated every   5-10 years through 64 years of age. However, you may need to be screened more often if early forms of precancerous polyps or small growths are found. Skin Cancer  Check your skin from head to toe regularly.  Tell your health care provider about any new moles or changes in moles, especially if there is a change in a mole's shape or color.  Also tell your health care provider if you have a mole that is larger than the size of a pencil eraser.  Always use sunscreen. Apply sunscreen liberally and repeatedly throughout the day.  Protect yourself by wearing long sleeves, pants, a wide-brimmed hat, and sunglasses whenever you are outside. HEART DISEASE, DIABETES, AND HIGH BLOOD PRESSURE   Have your blood pressure checked at least every 1-2 years. High blood pressure causes heart disease and increases the risk of stroke.  If you are between 55 years and 79 years old, ask your health care provider if you should take aspirin to prevent strokes.  Have regular diabetes screenings. This involves taking a blood sample to check your fasting blood sugar  level.  If you are at a normal weight and have a low risk for diabetes, have this test once every three years after 64 years of age.  If you are overweight and have a high risk for diabetes, consider being tested at a younger age or more often. PREVENTING INFECTION  Hepatitis B  If you have a higher risk for hepatitis B, you should be screened for this virus. You are considered at high risk for hepatitis B if:  You were born in a country where hepatitis B is common. Ask your health care provider which countries are considered high risk.  Your parents were born in a high-risk country, and you have not been immunized against hepatitis B (hepatitis B vaccine).  You have HIV or AIDS.  You use needles to inject street drugs.  You live with someone who has hepatitis B.  You have had sex with someone who has hepatitis B.  You get hemodialysis treatment.  You take certain medicines for conditions, including cancer, organ transplantation, and autoimmune conditions. Hepatitis C  Blood testing is recommended for:  Everyone born from 1945 through 1965.  Anyone with known risk factors for hepatitis C. Sexually transmitted infections (STIs)  You should be screened for sexually transmitted infections (STIs) including gonorrhea and chlamydia if:  You are sexually active and are younger than 64 years of age.  You are older than 64 years of age and your health care provider tells you that you are at risk for this type of infection.  Your sexual activity has changed since you were last screened and you are at an increased risk for chlamydia or gonorrhea. Ask your health care provider if you are at risk.  If you do not have HIV, but are at risk, it may be recommended that you take a prescription medicine daily to prevent HIV infection. This is called pre-exposure prophylaxis (PrEP). You are considered at risk if:  You are sexually active and do not regularly use condoms or know the HIV status  of your partner(s).  You take drugs by injection.  You are sexually active with a partner who has HIV. Talk with your health care provider about whether you are at high risk of being infected with HIV. If you choose to begin PrEP, you should first be tested for HIV. You should then be tested every 3 months for as long as you   are taking PrEP.  PREGNANCY   If you are premenopausal and you may become pregnant, ask your health care provider about preconception counseling.  If you may become pregnant, take 400 to 800 micrograms (mcg) of folic acid every day.  If you want to prevent pregnancy, talk to your health care provider about birth control (contraception). OSTEOPOROSIS AND MENOPAUSE   Osteoporosis is a disease in which the bones lose minerals and strength with aging. This can result in serious bone fractures. Your risk for osteoporosis can be identified using a bone density scan.  If you are 65 years of age or older, or if you are at risk for osteoporosis and fractures, ask your health care provider if you should be screened.  Ask your health care provider whether you should take a calcium or vitamin D supplement to lower your risk for osteoporosis.  Menopause may have certain physical symptoms and risks.  Hormone replacement therapy may reduce some of these symptoms and risks. Talk to your health care provider about whether hormone replacement therapy is right for you.  HOME CARE INSTRUCTIONS   Schedule regular health, dental, and eye exams.  Stay current with your immunizations.   Do not use any tobacco products including cigarettes, chewing tobacco, or electronic cigarettes.  If you are pregnant, do not drink alcohol.  If you are breastfeeding, limit how much and how often you drink alcohol.  Limit alcohol intake to no more than 1 drink per day for nonpregnant women. One drink equals 12 ounces of beer, 5 ounces of wine, or 1 ounces of hard liquor.  Do not use street  drugs.  Do not share needles.  Ask your health care provider for help if you need support or information about quitting drugs.  Tell your health care provider if you often feel depressed.  Tell your health care provider if you have ever been abused or do not feel safe at home. Document Released: 01/21/2011 Document Revised: 11/22/2013 Document Reviewed: 06/09/2013 [employer] Patient Information 2015 [employer]. This information is not intended to replace advice given to you by your health care provider. Make sure you discuss any questions you have with your health care provider.  

## 2014-04-20 NOTE — Progress Notes (Signed)
Pre visit review using our clinic review tool, if applicable. No additional management support is needed unless otherwise documented below in the visit note. 

## 2014-04-21 ENCOUNTER — Encounter: Payer: BC Managed Care – PPO | Admitting: Internal Medicine

## 2014-07-22 DIAGNOSIS — M1990 Unspecified osteoarthritis, unspecified site: Secondary | ICD-10-CM

## 2014-07-22 HISTORY — DX: Unspecified osteoarthritis, unspecified site: M19.90

## 2014-08-23 ENCOUNTER — Other Ambulatory Visit: Payer: Self-pay | Admitting: Internal Medicine

## 2014-12-21 ENCOUNTER — Telehealth: Payer: Self-pay

## 2014-12-21 NOTE — Telephone Encounter (Signed)
Left message on cell, concerning her overdue mammogram

## 2015-01-16 ENCOUNTER — Other Ambulatory Visit: Payer: Self-pay

## 2015-01-16 DIAGNOSIS — Z1231 Encounter for screening mammogram for malignant neoplasm of breast: Secondary | ICD-10-CM

## 2015-01-24 ENCOUNTER — Ambulatory Visit
Admission: RE | Admit: 2015-01-24 | Discharge: 2015-01-24 | Disposition: A | Discharge status: Home / Self Care | Payer: BC Managed Care – PPO | Source: Ambulatory Visit

## 2015-01-24 DIAGNOSIS — Z1231 Encounter for screening mammogram for malignant neoplasm of breast: Secondary | ICD-10-CM

## 2015-01-25 ENCOUNTER — Other Ambulatory Visit: Payer: Self-pay | Admitting: Internal Medicine

## 2015-01-27 ENCOUNTER — Other Ambulatory Visit: Payer: Self-pay | Admitting: Internal Medicine

## 2015-01-27 DIAGNOSIS — R928 Other abnormal and inconclusive findings on diagnostic imaging of breast: Secondary | ICD-10-CM

## 2015-02-08 ENCOUNTER — Ambulatory Visit
Admission: RE | Admit: 2015-02-08 | Discharge: 2015-02-08 | Disposition: A | Discharge status: Home / Self Care | Payer: BC Managed Care – PPO | Source: Ambulatory Visit | Attending: Internal Medicine | Admitting: Internal Medicine

## 2015-02-08 DIAGNOSIS — R928 Other abnormal and inconclusive findings on diagnostic imaging of breast: Secondary | ICD-10-CM

## 2015-02-23 ENCOUNTER — Telehealth: Payer: Self-pay | Admitting: Internal Medicine

## 2015-02-23 NOTE — Telephone Encounter (Signed)
[facility]igator [facility] called to advise that pt is duplicating rx, specifically the zolpidem (AMBIEN) 10 MG tablet Pt is getting Belsomar 15 mg from the [facility] [facility]. Pt got the [place] refilled in June and [place] investigator believes there may be 2 refilled left. She advises you may handle these refills that are left any way you feel necessary.  Pt is getting her adderal from them as well,

## 2015-02-24 NOTE — Telephone Encounter (Signed)
Please mark EMR that this office will not be available to RF ANY controlled drugs and these must be obtained from other providers who are presently prescribing

## 2015-02-24 NOTE — Telephone Encounter (Signed)
Flagged in [place]

## 2015-04-10 ENCOUNTER — Other Ambulatory Visit: Payer: Self-pay | Admitting: Internal Medicine

## 2015-04-28 ENCOUNTER — Other Ambulatory Visit: Payer: Self-pay | Admitting: Internal Medicine

## 2015-05-02 ENCOUNTER — Other Ambulatory Visit (INDEPENDENT_AMBULATORY_CARE_PROVIDER_SITE_OTHER): Payer: BC Managed Care – PPO

## 2015-05-02 DIAGNOSIS — Z0000 Encounter for general adult medical examination without abnormal findings: Secondary | ICD-10-CM

## 2015-05-02 LAB — CBC WITH DIFFERENTIAL/PLATELET
BASOS ABS: 0 10*3/uL (ref 0.0–0.1)
BASOS PCT: 0.7 % (ref 0.0–3.0)
EOS ABS: 0.2 10*3/uL (ref 0.0–0.7)
Eosinophils Relative: 3.6 % (ref 0.0–5.0)
HEMATOCRIT: 33.8 % — AB (ref 36.0–46.0)
Hemoglobin: 10.5 g/dL — ABNORMAL LOW (ref 12.0–15.0)
Lymphocytes Relative: 22.7 % (ref 12.0–46.0)
Lymphs Abs: 1.2 10*3/uL (ref 0.7–4.0)
MCHC: 31 g/dL (ref 30.0–36.0)
MCV: 72.8 fl — AB (ref 78.0–100.0)
MONO ABS: 0.4 10*3/uL (ref 0.1–1.0)
Monocytes Relative: 7 % (ref 3.0–12.0)
NEUTROS PCT: 66 % (ref 43.0–77.0)
Neutro Abs: 3.4 10*3/uL (ref 1.4–7.7)
Platelets: 343 10*3/uL (ref 150.0–400.0)
RBC: 4.65 Mil/uL (ref 3.87–5.11)
RDW: 17.9 % — ABNORMAL HIGH (ref 11.5–15.5)
WBC: 5.1 10*3/uL (ref 4.0–10.5)

## 2015-05-02 LAB — LIPID PANEL
CHOLESTEROL: 217 mg/dL — AB (ref 0–200)
HDL: 90.3 mg/dL (ref >39.00)
LDL Cholesterol: 117 mg/dL — ABNORMAL HIGH (ref 0–99)
NonHDL: 126.65
Total CHOL/HDL Ratio: 2
Triglycerides: 47 mg/dL (ref 0.0–149.0)
VLDL: 9.4 mg/dL (ref 0.0–40.0)

## 2015-05-02 LAB — POCT URINALYSIS DIPSTICK
Bilirubin, UA: NEGATIVE
Blood, UA: NEGATIVE
Glucose, UA: NEGATIVE
KETONES UA: NEGATIVE
LEUKOCYTES UA: NEGATIVE
Nitrite, UA: NEGATIVE
PH UA: 6.5
PROTEIN UA: NEGATIVE
Spec Grav, UA: 1.015
Urobilinogen, UA: 0.2

## 2015-05-02 LAB — BASIC METABOLIC PANEL
BUN: 19 mg/dL (ref 6–23)
CO2: 27 mEq/L (ref 19–32)
CREATININE: 1.14 mg/dL (ref 0.40–1.20)
Calcium: 9.7 mg/dL (ref 8.4–10.5)
Chloride: 103 mEq/L (ref 96–112)
GFR: 50.87 mL/min — ABNORMAL LOW (ref >60.00)
Glucose, Bld: 92 mg/dL (ref 70–99)
Potassium: 4.2 mEq/L (ref 3.5–5.1)
Sodium: 141 mEq/L (ref 135–145)

## 2015-05-02 LAB — HEPATIC FUNCTION PANEL
ALBUMIN: 4.1 g/dL (ref 3.5–5.2)
ALK PHOS: 89 U/L (ref 39–117)
ALT: 12 U/L (ref 0–35)
AST: 13 U/L (ref 0–37)
Bilirubin, Direct: 0 mg/dL (ref 0.0–0.3)
TOTAL PROTEIN: 6.9 g/dL (ref 6.0–8.3)
Total Bilirubin: 0.3 mg/dL (ref 0.2–1.2)

## 2015-05-02 LAB — TSH: TSH: 1.98 u[IU]/mL (ref 0.35–4.50)

## 2015-05-09 ENCOUNTER — Ambulatory Visit (INDEPENDENT_AMBULATORY_CARE_PROVIDER_SITE_OTHER): Payer: BC Managed Care – PPO | Admitting: Internal Medicine

## 2015-05-09 ENCOUNTER — Encounter: Payer: Self-pay | Admitting: Internal Medicine

## 2015-05-09 VITALS — BP 130/80 | HR 75 | Temp 98.1°F | Resp 20 | Ht 64.0 in | Wt 205.0 lb

## 2015-05-09 DIAGNOSIS — I10 Essential (primary) hypertension: Secondary | ICD-10-CM

## 2015-05-09 DIAGNOSIS — Z0000 Encounter for general adult medical examination without abnormal findings: Secondary | ICD-10-CM

## 2015-05-09 DIAGNOSIS — J3089 Other allergic rhinitis: Secondary | ICD-10-CM

## 2015-05-09 MED ORDER — AMPHETAMINE-DEXTROAMPHET ER 30 MG PO CP24: 30.0000 mg | ORAL_CAPSULE | ORAL | Status: DC

## 2015-05-09 MED ORDER — AMPHETAMINE-DEXTROAMPHETAMINE 20 MG PO TABS: ORAL_TABLET | ORAL | Status: DC

## 2015-05-09 NOTE — Patient Instructions (Signed)
Limit your sodium (Salt) intake  Please check your blood pressure on a regular basis.  If it is consistently greater than 150/90, please make an office appointment.    It is important that you exercise regularly, at least 20 minutes 3 to 4 times per week.  If you develop chest pain or shortness of breath seek  medical attention.  Menopause is a normal process in which your reproductive ability comes to an end. This process happens gradually over a span of months to years, usually between the ages of 30 and 32. Menopause is complete when you have missed 12 consecutive menstrual periods. It is important to talk with your health care provider about some of the most common conditions that affect postmenopausal women, such as heart disease, cancer, and bone loss (osteoporosis). Adopting a healthy lifestyle and getting preventive care can help to promote your health and wellness. Those actions can also lower your chances of developing some of these common conditions. WHAT SHOULD I KNOW ABOUT MENOPAUSE? During menopause, you may experience a number of symptoms, such as:  Moderate-to-severe hot flashes.  Night sweats.  Decrease in sex drive.  Mood swings.  Headaches.  Tiredness.  Irritability.  Memory problems.  Insomnia. Choosing to treat or not to treat menopausal changes is an individual decision that you make with your health care provider. WHAT SHOULD I KNOW ABOUT HORMONE REPLACEMENT THERAPY AND SUPPLEMENTS? Hormone therapy products are effective for treating symptoms that are associated with menopause, such as hot flashes and night sweats. Hormone replacement carries certain risks, especially as you become older. If you are thinking about using estrogen or estrogen with progestin treatments, discuss the benefits and risks with your health care provider. WHAT SHOULD I KNOW ABOUT HEART DISEASE AND STROKE? Heart disease, heart attack, and stroke become more likely as you age. This may be  due, in part, to the hormonal changes that your body experiences during menopause. These can affect how your body processes dietary fats, triglycerides, and cholesterol. Heart attack and stroke are both medical emergencies. There are many things that you can do to help prevent heart disease and stroke:  Have your blood pressure checked at least every 1-2 years. High blood pressure causes heart disease and increases the risk of stroke.  If you are 80-77 years old, ask your health care provider if you should take aspirin to prevent a heart attack or a stroke.  Do not use any tobacco products, including cigarettes, chewing tobacco, or electronic cigarettes. If you need help quitting, ask your health care provider.  It is important to eat a healthy diet and maintain a healthy weight.  Be sure to include plenty of vegetables, fruits, low-fat dairy products, and lean protein.  Avoid eating foods that are high in solid fats, added sugars, or salt (sodium).  Get regular exercise. This is one of the most important things that you can do for your health.  Try to exercise for at least 150 minutes each week. The type of exercise that you do should increase your heart rate and make you sweat. This is known as moderate-intensity exercise.  Try to do strengthening exercises at least twice each week. Do these in addition to the moderate-intensity exercise.  Know your numbers.Ask your health care provider to check your cholesterol and your blood glucose. Continue to have your blood tested as directed by your health care provider. WHAT SHOULD I KNOW ABOUT CANCER SCREENING? There are several types of cancer. Take the following steps to  reduce your risk and to catch any cancer development as early as possible. Breast Cancer  Practice breast self-awareness.  This means understanding how your breasts normally appear and feel.  It also means doing regular breast self-exams. Let your health care provider know  about any changes, no matter how small.  If you are 24 or older, have a clinician do a breast exam (clinical breast exam or CBE) every year. Depending on your age, family history, and medical history, it may be recommended that you also have a yearly breast X-ray (mammogram).  If you have a family history of breast cancer, talk with your health care provider about genetic screening.  If you are at high risk for breast cancer, talk with your health care provider about having an MRI and a mammogram every year.  Breast cancer (BRCA) gene test is recommended for women who have family members with BRCA-related cancers. Results of the assessment will determine the need for genetic counseling and BRCA1 and for BRCA2 testing. BRCA-related cancers include these types:  Breast. This occurs in males or females.  Ovarian.  Tubal. This may also be called fallopian tube cancer.  Cancer of the abdominal or pelvic lining (peritoneal cancer).  Prostate.  Pancreatic. Cervical, Uterine, and Ovarian Cancer Your health care provider may recommend that you be screened regularly for cancer of the pelvic organs. These include your ovaries, uterus, and vagina. This screening involves a pelvic exam, which includes checking for microscopic changes to the surface of your cervix (Pap test).  For women ages 21-65, health care providers may recommend a pelvic exam and a Pap test every three years. For women ages 76-65, they may recommend the Pap test and pelvic exam, combined with testing for human papilloma virus (HPV), every five years. Some types of HPV increase your risk of cervical cancer. Testing for HPV may also be done on women of any age who have unclear Pap test results.  Other health care providers may not recommend any screening for nonpregnant women who are considered low risk for pelvic cancer and have no symptoms. Ask your health care provider if a screening pelvic exam is right for you.  If you have had  past treatment for cervical cancer or a condition that could lead to cancer, you need Pap tests and screening for cancer for at least 20 years after your treatment. If Pap tests have been discontinued for you, your risk factors (such as having a new sexual partner) need to be reassessed to determine if you should start having screenings again. Some women have medical problems that increase the chance of getting cervical cancer. In these cases, your health care provider may recommend that you have screening and Pap tests more often.  If you have a family history of uterine cancer or ovarian cancer, talk with your health care provider about genetic screening.  If you have vaginal bleeding after reaching menopause, tell your health care provider.  There are currently no reliable tests available to screen for ovarian cancer. Lung Cancer Lung cancer screening is recommended for adults 34-60 years old who are at high risk for lung cancer because of a history of smoking. A yearly low-dose CT scan of the lungs is recommended if you:  Currently smoke.  Have a history of at least 30 pack-years of smoking and you currently smoke or have quit within the past 15 years. A pack-year is smoking an average of one pack of cigarettes per day for one year. Yearly screening  should:  Continue until it has been 15 years since you quit.  Stop if you develop a health problem that would prevent you from having lung cancer treatment. Colorectal Cancer  This type of cancer can be detected and can often be prevented.  Routine colorectal cancer screening usually begins at age 87 and continues through age 96.  If you have risk factors for colon cancer, your health care provider may recommend that you be screened at an earlier age.  If you have a family history of colorectal cancer, talk with your health care provider about genetic screening.  Your health care provider may also recommend using home test kits to check  for hidden blood in your stool.  A small camera at the end of a tube can be used to examine your colon directly (sigmoidoscopy or colonoscopy). This is done to check for the earliest forms of colorectal cancer.  Direct examination of the colon should be repeated every 5-10 years until age 95. However, if early forms of precancerous polyps or small growths are found or if you have a family history or genetic risk for colorectal cancer, you may need to be screened more often. Skin Cancer  Check your skin from head to toe regularly.  Monitor any moles. Be sure to tell your health care provider:  About any new moles or changes in moles, especially if there is a change in a mole's shape or color.  If you have a mole that is larger than the size of a pencil eraser.  If any of your family members has a history of skin cancer, especially at a young age, talk with your health care provider about genetic screening.  Always use sunscreen. Apply sunscreen liberally and repeatedly throughout the day.  Whenever you are outside, protect yourself by wearing long sleeves, pants, a wide-brimmed hat, and sunglasses. WHAT SHOULD I KNOW ABOUT OSTEOPOROSIS? Osteoporosis is a condition in which bone destruction happens more quickly than new bone creation. After menopause, you may be at an increased risk for osteoporosis. To help prevent osteoporosis or the bone fractures that can happen because of osteoporosis, the following is recommended:  If you are 10-37 years old, get at least 1,000 mg of calcium and at least 600 mg of vitamin D per day.  If you are older than age 51 but younger than age 90, get at least 1,200 mg of calcium and at least 600 mg of vitamin D per day.  If you are older than age 28, get at least 1,200 mg of calcium and at least 800 mg of vitamin D per day. Smoking and excessive alcohol intake increase the risk of osteoporosis. Eat foods that are rich in calcium and vitamin D, and do  weight-bearing exercises several times each week as directed by your health care provider.

## 2015-05-09 NOTE — Progress Notes (Signed)
Subjective:    Patient ID: Leslie [name], female    DOB: 02-18-1951, [age] y.o.   MRN: [id number]  HPI    Subjective:    Patient ID: Leslie [name], female    DOB: 02-18-1951, 64 y.o.   MRN: [id number]  HPI  64 -year-old patient who is seen today for a wellness exam.  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  She continues to be [facility] by [facility] for substance abuse and is on a low dose of Suboxone. Laboratory studies reviewed and did reveal a moderate iron deficiency anemia. She has a history of depression, which has been stable.  She has ADD, which has been treated successfully with Adderall.  She is scheduled for elective left total knee replacement surgery November 2016   Wt Readings from Last 3 Encounters:  05/09/15 205 lb (92.987 kg)  04/20/14 199 lb (90.266 kg)  03/02/14 197 lb (89.359 kg)    Current Allergies:   SULFAMETHOXAZOLE (SULFAMETHOXAZOLE)   Past Medical History:   Allergic rhinitis  Hypertension  obesity  Colonic polyps, hx of  ADD  Substance abuse  Past Surgical History:   Cholecystectomy  2004, kidney donation  breast biopsy 1998  2003, laparoscopic gastric jejunostomy  colonoscopy November 2006   Family History:   father died age 65 complications of COPD  mother died age 65 Status post CABG history congestive heart failure  One brother history of CAD ; s/p stent One sister, type 2 diabetes, status post renal transplant   Social History:  Reviewed history and no changes required:  Married   Past Medical History  Diagnosis Date  . Allergy   . Hypertension   . Obesity     post gastric bypass  . Colon polyps   . ADD (attention deficit disorder)   . Left knee pain     chronic  . Narcotic addiction (HCC)   . Vitamin D deficiency     Social History   Social History  . Marital Status: Single    Spouse Name: N/A  . Number of Children: N/A  . Years of Education: N/A    Occupational History  . Not on file.   Social History Main Topics  . Smoking status: Former Smoker    Quit date: 07/22/1992  . Smokeless tobacco: Never Used  . Alcohol Use: Yes     Comment: once every 6 months  . Drug Use: No  . Sexual Activity: Not on file   Other Topics Concern  . Not on file   Social History Narrative    Past Surgical History  Procedure Laterality Date  . Gastric bypass    . Cholecystectomy    . Kidney donation  2004  . Brain surgery      breast biopsy    Family History  Problem Relation Age of Onset  . Heart disease Mother     post CABG history of CHF  . COPD Father   . Diabetes Sister   . Hypertension Sister     post renal transplant  . Heart disease Brother     CAD    Allergies  Allergen Reactions  . Sulfamethoxazole     REACTION: unspecified    Current Outpatient Prescriptions on File Prior to Visit  Medication Sig Dispense Refill  . amphetamine-dextroamphetamine (ADDERALL XR) 30 MG 24 hr capsule Take 1 capsule (30 mg total) by mouth every morning. 30 capsule 0  . amphetamine-dextroamphetamine (ADDERALL XR) 30  MG 24 hr capsule Take 1 capsule (30 mg total) by mouth every morning. 30 capsule 0  . amphetamine-dextroamphetamine (ADDERALL XR) 30 MG 24 hr capsule Take 1 capsule (30 mg total) by mouth every morning. 30 capsule 0  . FLUoxetine (PROZAC) 40 MG capsule take 1 capsule by mouth once daily 90 capsule 0  . hydrochlorothiazide (HYDRODIURIL) 25 MG tablet take 1 tablet by mouth daily 90 tablet 3  . lamoTRIgine (LAMICTAL) 100 MG tablet Take 100 mg by mouth daily.    Marland Kitchen triamcinolone (NASACORT) 55 MCG/ACT nasal inhaler Place 2 sprays into the nose daily. 1 Inhaler 6  . zolpidem (AMBIEN) 10 MG tablet take 1 tablet by mouth once daily at bedtime for sleep 30 tablet 2   No current facility-administered medications on file prior to visit.    BP 130/80 mmHg  Pulse 75  Temp(Src) 98.1 F (36.7 C) (Oral)  Resp 20  Ht  (1.626 m)  Wt  205 lb (92.987 kg)  BMI 35.17 kg/m2  SpO2 98%    Review of Systems  Constitutional: Negative for fever, appetite change, fatigue and unexpected weight change.  HENT: Negative for hearing loss, ear pain, nosebleeds, congestion, sore throat, mouth sores, trouble swallowing, neck stiffness, dental problem, voice change, sinus pressure and tinnitus.   Eyes: Negative for photophobia, pain, redness and visual disturbance.  Respiratory: Negative for cough, chest tightness and shortness of breath.   Cardiovascular: Negative for chest pain, palpitations and leg swelling.  Gastrointestinal: Negative for nausea, vomiting, abdominal pain, diarrhea, constipation, blood in stool, abdominal distention and rectal pain.  Genitourinary: Negative for dysuria, urgency, frequency, hematuria, flank pain, vaginal bleeding, vaginal discharge, difficulty urinating, genital sores, vaginal pain, menstrual problem and pelvic pain.  Musculoskeletal: Negative for back pain and arthralgias.  Skin: Negative for rash.  Neurological: Negative for dizziness, syncope, speech difficulty, weakness, light-headedness, numbness and headaches.  Hematological: Negative for adenopathy. Does not bruise/bleed easily.  Psychiatric/Behavioral: Negative for suicidal ideas, behavioral problems, self-injury, dysphoric mood and agitation. The patient is not nervous/anxious.        Objective:   Physical Exam  Constitutional: She is oriented to person, place, and time. She appears well-developed and well-nourished.  HENT:  Head: Normocephalic and atraumatic.  Right Ear: External ear normal.  Left Ear: External ear normal.  Mouth/Throat: Oropharynx is clear and moist.  Eyes: Conjunctivae and EOM are normal.  Neck: Normal range of motion. Neck supple. No JVD present. No thyromegaly present.  Cardiovascular: Normal rate, regular rhythm, normal heart sounds and intact distal pulses.   No murmur heard. Pulmonary/Chest: Effort normal and  breath sounds normal. She has no wheezes. She has no rales.  Abdominal: Soft. Bowel sounds are normal. She exhibits no distension and no mass. There is no tenderness. There is no rebound and no guarding.  Musculoskeletal: Normal range of motion. She exhibits no edema and no tenderness.  Neurological: She is alert and oriented to person, place, and time. She has normal reflexes. No cranial nerve deficit. She exhibits normal muscle tone. Coordination normal.  Skin: Skin is warm and dry. No rash noted.  Psychiatric: She has a normal mood and affect. Her behavior is normal.          Assessment & Plan:   Preventive health examination.  Needs follow-up colonoscopy Exogenous obesity ADD. Prescriptions dispensed Substance abuse.  Patient has now been successfully tapered off Suboxone  Screening mammogram.  Encouraged Supplemental  iron therapy encouraged Recheck 6 months Low salt diet Home  blood pressure monitoring  Weight loss exercise restricted salt diet all encouraged. We'll recheck in 6 months  Review of Systems     as above Objective:   Physical Exam  Constitutional:  Repeat blood pressure 110/70  HENT:  Pharyngeal crowding  Cardiovascular:  [place] pedis pulses full.  Posterior tibial pulses faint  Abdominal:  Obese, soft and nontender Abdominal scars     As above     Assessment & Plan:  As above

## 2015-05-09 NOTE — Progress Notes (Signed)
Pre visit review using our clinic review tool, if applicable. No additional management support is needed unless otherwise documented below in the visit note. 

## 2015-05-18 ENCOUNTER — Other Ambulatory Visit: Payer: Self-pay | Admitting: Internal Medicine

## 2015-05-31 ENCOUNTER — Other Ambulatory Visit (HOSPITAL_COMMUNITY): Payer: Self-pay | Admitting: Orthopaedic Surgery

## 2015-05-31 ENCOUNTER — Other Ambulatory Visit (HOSPITAL_COMMUNITY): Payer: Self-pay | Admitting: Anesthesiology

## 2015-05-31 NOTE — Patient Instructions (Addendum)
Caro HightKaylene W Crespi  05/31/2015   Your procedure is scheduled ZO:XWRUEAon:Friday 06/16/2015   [facility]  [place] to 3rd floor to  Short Stay Center at  0530 AM.  Call this number if you have problems the morning of surgery [phone]   Remember: ONLY 1 PERSON MAY GO WITH YOU TO SHORT STAY TO GET  READY MORNING OF YOUR SURGERY.  Do not eat food or drink liquids :After Midnight.     Take these medicines the morning of surgery with A SIP OF WATER:   Prozac, Lamictal, NASACORT INHALER, EYE DROP IF NEEDED                             You may not have any metal on your body including hair pins and              piercings  Do not wear jewelry, make-up, lotions, powders or perfumes, deodorant             Do not wear nail polish.  Do not shave  48 hours prior to surgery.              Men may shave face and neck.   Do not bring valuables to the hospital. [facility] IS NOT             RESPONSIBLE   FOR VALUABLES.  Contacts, dentures or bridgework may not be worn into surgery.  Leave suitcase in the car. After surgery it may be brought to your room.     Patients discharged the day of surgery will not be allowed to drive home.  Name and phone number of your driver:  Special Instructions: N/A              Please read over the following fact sheets you were given: _____________________________________________________________________             [facility] - Preparing for Surgery Before surgery, you can play an important role.  Because skin is not sterile, your skin needs to be as free of germs as possible.  You can reduce the number of germs on your skin by washing with CHG (chlorahexidine gluconate) soap before surgery.  CHG is an antiseptic cleaner which kills germs and bonds with the skin to continue killing germs even after washing. Please DO NOT use if you have an allergy to CHG or antibacterial soaps.  If your skin becomes  reddened/irritated stop using the CHG and inform your nurse when you arrive at Short Stay. Do not shave (including legs and underarms) for at least 48 hours prior to the first CHG shower.  You may shave your face/neck. Please follow these instructions carefully:  1.  Shower with CHG Soap the night before surgery and the  morning of Surgery.  2.  If you choose to wash your hair, wash your hair first as usual with your  normal  shampoo.  3.  After you shampoo, rinse your hair and body thoroughly to remove the  shampoo.                           4.  Use CHG as you would any other liquid soap.  You can apply chg directly  to the skin and wash  Gently with a scrungie or clean washcloth.  5.  Apply the CHG Soap to your body ONLY FROM THE NECK DOWN.   Do not use on face/ open                           Wound or open sores. Avoid contact with eyes, ears mouth and genitals (private parts).                       Wash face,  Genitals (private parts) with your normal soap.             6.  Wash thoroughly, paying special attention to the area where your surgery  will be performed.  7.  Thoroughly rinse your body with warm water from the neck down.  8.  DO NOT shower/wash with your normal soap after using and rinsing off  the CHG Soap.                9.  Pat yourself dry with a clean towel.            10.  Wear clean pajamas.            11.  Place clean sheets on your bed the night of your first shower and do not  sleep with pets. Day of Surgery : Do not apply any lotions/deodorants the morning of surgery.  Please wear clean clothes to the hospital/surgery center.  FAILURE TO FOLLOW THESE INSTRUCTIONS MAY RESULT IN THE CANCELLATION OF YOUR SURGERY PATIENT SIGNATURE_________________________________  NURSE SIGNATURE__________________________________  ________________________________________________________________________   Adam Phenix  An incentive spirometer is a tool that  can help keep your lungs clear and active. This tool measures how well you are filling your lungs with each breath. Taking long deep breaths may help reverse or decrease the chance of developing breathing (pulmonary) problems (especially infection) following:  A long period of time when you are unable to move or be active. BEFORE THE PROCEDURE   If the spirometer includes an indicator to show your best effort, your nurse or respiratory therapist will set it to a desired goal.  If possible, sit up straight or lean slightly forward. Try not to slouch.  Hold the incentive spirometer in an upright position. INSTRUCTIONS FOR USE   Sit on the edge of your bed if possible, or sit up as far as you can in bed or on a chair.  Hold the incentive spirometer in an upright position.  Breathe out normally.  Place the mouthpiece in your mouth and seal your lips tightly around it.  Breathe in slowly and as deeply as possible, raising the piston or the ball toward the top of the column.  Hold your breath for 3-5 seconds or for as long as possible. Allow the piston or ball to fall to the bottom of the column.  Remove the mouthpiece from your mouth and breathe out normally.  Rest for a few seconds and repeat Steps 1 through 7 at least 10 times every 1-2 hours when you are awake. Take your time and take a few normal breaths between deep breaths.  The spirometer may include an indicator to show your best effort. Use the indicator as a goal to work toward during each repetition.  After each set of 10 deep breaths, practice coughing to be sure your lungs are clear. If you have an incision (the cut made at the time of surgery),  support your incision when coughing by placing a pillow or rolled up towels firmly against it. Once you are able to get out of bed, walk around indoors and cough well. You may stop using the incentive spirometer when instructed by your caregiver.  RISKS AND COMPLICATIONS  Take your  time so you do not get dizzy or light-headed.  If you are in pain, you may need to take or ask for pain medication before doing incentive spirometry. It is harder to take a deep breath if you are having pain. AFTER USE  Rest and breathe slowly and easily.  It can be helpful to keep track of a log of your progress. Your caregiver can provide you with a simple table to help with this. If you are using the spirometer at home, follow these instructions: [place] IF:   You are having difficultly using the spirometer.  You have trouble using the spirometer as often as instructed.  Your pain medication is not giving enough relief while using the spirometer.  You develop fever of 100.5 F (38.1 C) or higher. SEEK IMMEDIATE MEDICAL CARE IF:   You cough up bloody sputum that had not been present before.  You develop fever of 102 F (38.9 C) or greater.  You develop worsening pain at or near the incision site. MAKE SURE YOU:   Understand these instructions.  Will watch your condition.  Will get help right away if you are not doing well or get worse. Document Released: 11/18/2006 Document Revised: 09/30/2011 Document Reviewed: 01/19/2007 Exit[employer] Patient Information 2014 Exit[employer], [place].   ________________________________________________________________________  WHAT IS A BLOOD TRANSFUSION? Blood Transfusion Information  A transfusion is the replacement of blood or some of its parts. Blood is made up of multiple cells which provide different functions.  Red blood cells carry oxygen and are used for blood loss replacement.  White blood cells fight against infection.  Platelets control bleeding.  Plasma helps clot blood.  Other blood products are available for specialized needs, such as hemophilia or other clotting disorders. BEFORE THE TRANSFUSION  Who gives blood for transfusions?   Healthy volunteers who are fully evaluated to make sure their blood is safe. This  is blood bank blood. Transfusion therapy is the safest it has ever been in the practice of medicine. Before blood is taken from a donor, a complete history is taken to make sure that person has no history of diseases nor engages in risky social behavior (examples are intravenous drug use or sexual activity with multiple partners). The donor's travel history is screened to minimize risk of transmitting infections, such as malaria. The donated blood is tested for signs of infectious diseases, such as HIV and hepatitis. The blood is then tested to be sure it is compatible with you in order to minimize the chance of a transfusion reaction. If you or a relative donates blood, this is often done in anticipation of surgery and is not appropriate for emergency situations. It takes many days to process the donated blood. RISKS AND COMPLICATIONS Although transfusion therapy is very safe and saves many lives, the main dangers of transfusion include:   Getting an infectious disease.  Developing a transfusion reaction. This is an allergic reaction to something in the blood you were given. Every precaution is taken to prevent this. The decision to have a blood transfusion has been considered carefully by your caregiver before blood is given. Blood is not given unless the benefits outweigh the risks. AFTER THE TRANSFUSION  Right after receiving a blood transfusion, you will usually feel much better and more energetic. This is especially true if your red blood cells have gotten low (anemic). The transfusion raises the level of the red blood cells which carry oxygen, and this usually causes an energy increase.  The nurse administering the transfusion will monitor you carefully for complications. HOME CARE INSTRUCTIONS  No special instructions are needed after a transfusion. You may find your energy is better. Speak with your caregiver about any limitations on activity for underlying diseases you may have. SEEK  MEDICAL CARE IF:   Your condition is not improving after your transfusion.  You develop redness or irritation at the intravenous (IV) site. SEEK IMMEDIATE MEDICAL CARE IF:  Any of the following symptoms occur over the next 12 hours:  Shaking chills.  You have a temperature by mouth above 102 F (38.9 C), not controlled by medicine.  Chest, back, or muscle pain.  People around you feel you are not acting correctly or are confused.  Shortness of breath or difficulty breathing.  Dizziness and fainting.  You get a rash or develop hives.  You have a decrease in urine output.  Your urine turns a dark color or changes to pink, red, or brown. Any of the following symptoms occur over the next 10 days:  You have a temperature by mouth above 102 F (38.9 C), not controlled by medicine.  Shortness of breath.  Weakness after normal activity.  The white part of the eye turns yellow (jaundice).  You have a decrease in the amount of urine or are urinating less often.  Your urine turns a dark color or changes to pink, red, or brown. Document Released: 07/05/2000 Document Revised: 09/30/2011 Document Reviewed: 02/22/2008 [facility] Patient Information 2014 [place], [place].  _______________________________________________________________________

## 2015-06-01 ENCOUNTER — Encounter (HOSPITAL_COMMUNITY): Payer: Self-pay

## 2015-06-01 ENCOUNTER — Encounter (HOSPITAL_COMMUNITY)
Admission: RE | Admit: 2015-06-01 | Discharge: 2015-06-01 | Disposition: A | Discharge status: Home / Self Care | Payer: BC Managed Care – PPO | Source: Ambulatory Visit | Attending: Orthopaedic Surgery | Admitting: Orthopaedic Surgery

## 2015-06-01 DIAGNOSIS — M179 Osteoarthritis of knee, unspecified: Secondary | ICD-10-CM | POA: Diagnosis not present

## 2015-06-01 DIAGNOSIS — Z01818 Encounter for other preprocedural examination: Secondary | ICD-10-CM | POA: Insufficient documentation

## 2015-06-01 LAB — SURGICAL PCR SCREEN
MRSA, PCR: NEGATIVE
Staphylococcus aureus: NEGATIVE

## 2015-06-01 LAB — BASIC METABOLIC PANEL
Anion gap: 9 (ref 5–15)
BUN: 15 mg/dL (ref 6–20)
CO2: 29 mmol/L (ref 22–32)
CREATININE: 1.1 mg/dL — AB (ref 0.44–1.00)
Calcium: 9.3 mg/dL (ref 8.9–10.3)
Chloride: 99 mmol/L — ABNORMAL LOW (ref 101–111)
GFR, EST NON AFRICAN AMERICAN: 52 mL/min — AB (ref >60)
Glucose, Bld: 92 mg/dL (ref 65–99)
Potassium: 4 mmol/L (ref 3.5–5.1)
SODIUM: 137 mmol/L (ref 135–145)

## 2015-06-01 LAB — APTT: aPTT: 29 seconds (ref 24–37)

## 2015-06-01 LAB — CBC
HEMATOCRIT: 35.6 % — AB (ref 36.0–46.0)
Hemoglobin: 10.5 g/dL — ABNORMAL LOW (ref 12.0–15.0)
MCH: 22.8 pg — ABNORMAL LOW (ref 26.0–34.0)
MCHC: 29.5 g/dL — AB (ref 30.0–36.0)
MCV: 77.4 fL — AB (ref 78.0–100.0)
Platelets: 372 10*3/uL (ref 150–400)
RBC: 4.6 MIL/uL (ref 3.87–5.11)
RDW: 16.1 % — AB (ref 11.5–15.5)
WBC: 4.6 10*3/uL (ref 4.0–10.5)

## 2015-06-01 LAB — PROTIME-INR
INR: 1.01 (ref 0.00–1.49)
Prothrombin Time: 13.5 seconds (ref 11.6–15.2)

## 2015-06-05 ENCOUNTER — Encounter: Payer: Self-pay | Admitting: Gastroenterology

## 2015-06-05 NOTE — Progress Notes (Signed)
Final EKG done 06/01/15- EPIC

## 2015-06-14 NOTE — Anesthesia Preprocedure Evaluation (Addendum)
Anesthesia Evaluation  Patient identified by MRN, date of birth, ID band Patient awake    Reviewed: Allergy & Precautions, NPO status , Patient's Chart, lab work & pertinent test results  History of Anesthesia Complications Negative for: history of anesthetic complications  Airway Mallampati: II  TM Distance: >3 FB Neck ROM: Full    Dental no notable dental hx. (+) Partial Upper   Pulmonary former smoker,    Pulmonary exam normal breath sounds clear to auscultation       Cardiovascular hypertension, Pt. on medications Normal cardiovascular exam Rhythm:Regular Rate:Normal     Neuro/Psych PSYCHIATRIC DISORDERS (ADD) negative neurological ROS     GI/Hepatic negative GI ROS, Substance abuse, on suboxone   Endo/Other  obesity  Renal/GU negative Renal ROS  negative genitourinary   Musculoskeletal negative musculoskeletal ROS (+)   Abdominal   Peds negative pediatric ROS (+)  Hematology negative hematology ROS (+)   Anesthesia Other Findings   Reproductive/Obstetrics negative OB ROS                            Anesthesia Physical Anesthesia Plan  ASA: III  Anesthesia Plan: Spinal   Post-op Pain Management:    Induction: Intravenous  Airway Management Planned: Nasal Cannula  Additional Equipment:   Intra-op Plan:   Post-operative Plan:   Informed Consent: I have reviewed the patients History and Physical, chart, labs and discussed the procedure including the risks, benefits and alternatives for the proposed anesthesia with the patient or authorized representative who has indicated his/her understanding and acceptance.   Dental advisory given  Plan Discussed with: CRNA  Anesthesia Plan Comments:         Anesthesia Quick Evaluation

## 2015-06-16 ENCOUNTER — Inpatient Hospital Stay (HOSPITAL_COMMUNITY): Payer: BC Managed Care – PPO | Admitting: Anesthesiology

## 2015-06-16 ENCOUNTER — Encounter (HOSPITAL_COMMUNITY)
Admission: RE | Disposition: A | Discharge status: Home Health | Payer: Self-pay | Source: Ambulatory Visit | Attending: Orthopaedic Surgery

## 2015-06-16 ENCOUNTER — Encounter (HOSPITAL_COMMUNITY): Payer: Self-pay | Admitting: *Deleted

## 2015-06-16 ENCOUNTER — Inpatient Hospital Stay (HOSPITAL_COMMUNITY)
Admission: RE | Admit: 2015-06-16 | Discharge: 2015-06-19 | DRG: 470 | Disposition: A | Discharge status: Home Health | Payer: BC Managed Care – PPO | Source: Ambulatory Visit | Attending: Orthopaedic Surgery | Admitting: Orthopaedic Surgery

## 2015-06-16 ENCOUNTER — Inpatient Hospital Stay (HOSPITAL_COMMUNITY): Payer: BC Managed Care – PPO

## 2015-06-16 DIAGNOSIS — Z6833 Body mass index (BMI) 33.0-33.9, adult: Secondary | ICD-10-CM

## 2015-06-16 DIAGNOSIS — M25562 Pain in left knee: Secondary | ICD-10-CM | POA: Diagnosis present

## 2015-06-16 DIAGNOSIS — Z9049 Acquired absence of other specified parts of digestive tract: Secondary | ICD-10-CM

## 2015-06-16 DIAGNOSIS — D62 Acute posthemorrhagic anemia: Secondary | ICD-10-CM | POA: Diagnosis not present

## 2015-06-16 DIAGNOSIS — Z9884 Bariatric surgery status: Secondary | ICD-10-CM | POA: Diagnosis not present

## 2015-06-16 DIAGNOSIS — Z01812 Encounter for preprocedural laboratory examination: Secondary | ICD-10-CM

## 2015-06-16 DIAGNOSIS — Z79899 Other long term (current) drug therapy: Secondary | ICD-10-CM

## 2015-06-16 DIAGNOSIS — M1712 Unilateral primary osteoarthritis, left knee: Principal | ICD-10-CM

## 2015-06-16 DIAGNOSIS — I10 Essential (primary) hypertension: Secondary | ICD-10-CM | POA: Diagnosis present

## 2015-06-16 DIAGNOSIS — Z96652 Presence of left artificial knee joint: Secondary | ICD-10-CM

## 2015-06-16 DIAGNOSIS — Z87891 Personal history of nicotine dependence: Secondary | ICD-10-CM

## 2015-06-16 DIAGNOSIS — E669 Obesity, unspecified: Secondary | ICD-10-CM | POA: Diagnosis present

## 2015-06-16 HISTORY — PX: TOTAL KNEE ARTHROPLASTY: SHX125

## 2015-06-16 HISTORY — DX: Unspecified osteoarthritis, unspecified site: M19.90

## 2015-06-16 LAB — ABO/RH: ABO/RH(D): A POS

## 2015-06-16 SURGERY — ARTHROPLASTY, KNEE, TOTAL
Anesthesia: Spinal | Site: Knee | Laterality: Left

## 2015-06-16 MED ORDER — ACETAMINOPHEN 325 MG PO TABS
ORAL_TABLET | ORAL | Status: DC | PRN
  Administered 2015-06-16: 1000 mg via ORAL

## 2015-06-16 MED ORDER — PHENOL 1.4 % MT LIQD
1.0000 | OROMUCOSAL | Status: DC | PRN
  Administered 2015-06-17: 1 via OROMUCOSAL
  Filled 2015-06-16: qty 177

## 2015-06-16 MED ORDER — ACETAMINOPHEN 325 MG PO TABS
650.0000 mg | ORAL_TABLET | Freq: Four times a day (QID) | ORAL | Status: DC | PRN
  Administered 2015-06-18: 650 mg via ORAL
  Filled 2015-06-16: qty 2

## 2015-06-16 MED ORDER — SODIUM CHLORIDE 0.9 % IV SOLN: 40.0000 mL | Freq: Once | INTRAVENOUS | Status: DC

## 2015-06-16 MED ORDER — SODIUM CHLORIDE 0.9 % IJ SOLN
INTRAMUSCULAR | Status: AC
  Filled 2015-06-16: qty 40

## 2015-06-16 MED ORDER — MENTHOL 3 MG MT LOZG: 1.0000 | LOZENGE | OROMUCOSAL | Status: DC | PRN

## 2015-06-16 MED ORDER — METHOCARBAMOL 500 MG PO TABS
500.0000 mg | ORAL_TABLET | Freq: Four times a day (QID) | ORAL | Status: DC | PRN
  Administered 2015-06-16 – 2015-06-18 (×7): 500 mg via ORAL
  Filled 2015-06-16 (×7): qty 1

## 2015-06-16 MED ORDER — OXYCODONE HCL 5 MG PO TABS
5.0000 mg | ORAL_TABLET | ORAL | Status: DC | PRN
  Administered 2015-06-16: 10 mg via ORAL
  Administered 2015-06-16: 15 mg via ORAL
  Administered 2015-06-16: 10 mg via ORAL
  Administered 2015-06-17 – 2015-06-19 (×14): 15 mg via ORAL
  Filled 2015-06-16 (×2): qty 3
  Filled 2015-06-16: qty 2
  Filled 2015-06-16 (×8): qty 3
  Filled 2015-06-16: qty 2
  Filled 2015-06-16 (×5): qty 3

## 2015-06-16 MED ORDER — DIPHENHYDRAMINE HCL 12.5 MG/5ML PO ELIX: 12.5000 mg | ORAL_SOLUTION | ORAL | Status: DC | PRN

## 2015-06-16 MED ORDER — POLYVINYL ALCOHOL 1.4 % OP SOLN
1.0000 [drp] | Freq: Three times a day (TID) | OPHTHALMIC | Status: DC | PRN
  Filled 2015-06-16: qty 15

## 2015-06-16 MED ORDER — FENTANYL CITRATE (PF) 100 MCG/2ML IJ SOLN
25.0000 ug | INTRAMUSCULAR | Status: DC | PRN
  Administered 2015-06-16: 50 ug via INTRAVENOUS
  Administered 2015-06-16 (×2): 25 ug via INTRAVENOUS

## 2015-06-16 MED ORDER — NEOSTIGMINE METHYLSULFATE 10 MG/10ML IV SOLN
INTRAVENOUS | Status: DC | PRN
  Administered 2015-06-16: 4 mg via INTRAVENOUS

## 2015-06-16 MED ORDER — LACTATED RINGERS IV SOLN
INTRAVENOUS | Status: DC | PRN
  Administered 2015-06-16 (×3): via INTRAVENOUS

## 2015-06-16 MED ORDER — COUMADIN BOOK
Freq: Once | Status: DC
  Filled 2015-06-16: qty 1

## 2015-06-16 MED ORDER — LIP MEDEX EX OINT
TOPICAL_OINTMENT | CUTANEOUS | Status: AC
  Filled 2015-06-16: qty 7

## 2015-06-16 MED ORDER — OXYCODONE HCL ER 10 MG PO T12A
10.0000 mg | EXTENDED_RELEASE_TABLET | Freq: Two times a day (BID) | ORAL | Status: DC
  Administered 2015-06-16 – 2015-06-19 (×6): 10 mg via ORAL
  Filled 2015-06-16 (×6): qty 1

## 2015-06-16 MED ORDER — GLYCOPYRROLATE 0.2 MG/ML IJ SOLN
INTRAMUSCULAR | Status: DC | PRN
  Administered 2015-06-16: 0.6 mg via INTRAVENOUS

## 2015-06-16 MED ORDER — PHENYLEPHRINE HCL 10 MG/ML IJ SOLN
INTRAMUSCULAR | Status: DC | PRN
  Administered 2015-06-16: 40 ug via INTRAVENOUS
  Administered 2015-06-16: 80 ug via INTRAVENOUS
  Administered 2015-06-16 (×3): 40 ug via INTRAVENOUS

## 2015-06-16 MED ORDER — BUPIVACAINE LIPOSOME 1.3 % IJ SUSP
20.0000 mL | Freq: Once | INTRAMUSCULAR | Status: AC
  Administered 2015-06-16: 20 mL
  Filled 2015-06-16 (×2): qty 20

## 2015-06-16 MED ORDER — AMPHETAMINE-DEXTROAMPHET ER 10 MG PO CP24
30.0000 mg | ORAL_CAPSULE | ORAL | Status: DC
  Administered 2015-06-19: 30 mg via ORAL
  Filled 2015-06-16: qty 1

## 2015-06-16 MED ORDER — PROPOFOL 10 MG/ML IV BOLUS
INTRAVENOUS | Status: AC
  Filled 2015-06-16: qty 60

## 2015-06-16 MED ORDER — SODIUM CHLORIDE 0.9 % IV SOLN
INTRAVENOUS | Status: DC
  Administered 2015-06-16 – 2015-06-17 (×2): via INTRAVENOUS

## 2015-06-16 MED ORDER — MIDAZOLAM HCL 5 MG/5ML IJ SOLN
INTRAMUSCULAR | Status: DC | PRN
  Administered 2015-06-16: 2 mg via INTRAVENOUS

## 2015-06-16 MED ORDER — POLYETHYLENE GLYCOL 3350 17 G PO PACK: 17.0000 g | PACK | Freq: Every day | ORAL | Status: DC | PRN

## 2015-06-16 MED ORDER — HYDROMORPHONE HCL 1 MG/ML IJ SOLN
INTRAMUSCULAR | Status: DC | PRN
  Administered 2015-06-16 (×4): 0.5 mg via INTRAVENOUS

## 2015-06-16 MED ORDER — FENTANYL CITRATE (PF) 100 MCG/2ML IJ SOLN
INTRAMUSCULAR | Status: AC
  Filled 2015-06-16: qty 2

## 2015-06-16 MED ORDER — FENTANYL CITRATE (PF) 100 MCG/2ML IJ SOLN
INTRAMUSCULAR | Status: AC
Start: 2015-06-16 — End: 2015-06-16
  Filled 2015-06-16: qty 2

## 2015-06-16 MED ORDER — WARFARIN VIDEO
Freq: Once | Status: DC
Start: 2015-06-16 — End: 2015-06-19

## 2015-06-16 MED ORDER — ONDANSETRON HCL 4 MG/2ML IJ SOLN
INTRAMUSCULAR | Status: AC
  Filled 2015-06-16: qty 2

## 2015-06-16 MED ORDER — EPHEDRINE SULFATE 50 MG/ML IJ SOLN
INTRAMUSCULAR | Status: AC
  Filled 2015-06-16: qty 1

## 2015-06-16 MED ORDER — ZOLPIDEM TARTRATE 5 MG PO TABS
5.0000 mg | ORAL_TABLET | Freq: Every evening | ORAL | Status: DC | PRN
  Administered 2015-06-17 – 2015-06-18 (×3): 5 mg via ORAL
  Filled 2015-06-16 (×3): qty 1

## 2015-06-16 MED ORDER — SODIUM CHLORIDE 0.9 % IJ SOLN
INTRAMUSCULAR | Status: DC | PRN
  Administered 2015-06-16: 40 mL

## 2015-06-16 MED ORDER — SODIUM CHLORIDE 0.9 % IJ SOLN
INTRAMUSCULAR | Status: AC
  Filled 2015-06-16: qty 10

## 2015-06-16 MED ORDER — METOCLOPRAMIDE HCL 5 MG/ML IJ SOLN
5.0000 mg | Freq: Three times a day (TID) | INTRAMUSCULAR | Status: DC | PRN
Start: 2015-06-16 — End: 2015-06-19

## 2015-06-16 MED ORDER — DEXAMETHASONE SODIUM PHOSPHATE 10 MG/ML IJ SOLN
INTRAMUSCULAR | Status: DC | PRN
  Administered 2015-06-16: 10 mg via INTRAVENOUS

## 2015-06-16 MED ORDER — WARFARIN - PHARMACIST DOSING INPATIENT: Freq: Every day | Status: DC

## 2015-06-16 MED ORDER — ONDANSETRON HCL 4 MG PO TABS
4.0000 mg | ORAL_TABLET | Freq: Four times a day (QID) | ORAL | Status: DC | PRN
  Administered 2015-06-19: 4 mg via ORAL
  Filled 2015-06-16: qty 1

## 2015-06-16 MED ORDER — ROCURONIUM BROMIDE 100 MG/10ML IV SOLN
INTRAVENOUS | Status: DC | PRN
  Administered 2015-06-16: 30 mg via INTRAVENOUS

## 2015-06-16 MED ORDER — PROPOFOL 10 MG/ML IV BOLUS
INTRAVENOUS | Status: DC | PRN
Start: 2015-06-16 — End: 2015-06-16
  Administered 2015-06-16: 150 mg via INTRAVENOUS

## 2015-06-16 MED ORDER — HYDROMORPHONE HCL 1 MG/ML IJ SOLN
INTRAMUSCULAR | Status: AC
  Filled 2015-06-16: qty 1

## 2015-06-16 MED ORDER — LAMOTRIGINE 200 MG PO TABS
200.0000 mg | ORAL_TABLET | Freq: Every day | ORAL | Status: DC
  Administered 2015-06-17 – 2015-06-19 (×3): 200 mg via ORAL
  Filled 2015-06-16 (×3): qty 1

## 2015-06-16 MED ORDER — 0.9 % SODIUM CHLORIDE (POUR BTL) OPTIME
TOPICAL | Status: DC | PRN
  Administered 2015-06-16: 1000 mL

## 2015-06-16 MED ORDER — CEFAZOLIN SODIUM 1-5 GM-% IV SOLN
1.0000 g | Freq: Four times a day (QID) | INTRAVENOUS | Status: AC
  Administered 2015-06-16 (×2): 1 g via INTRAVENOUS
  Filled 2015-06-16 (×2): qty 50

## 2015-06-16 MED ORDER — METOCLOPRAMIDE HCL 10 MG PO TABS: 5.0000 mg | ORAL_TABLET | Freq: Three times a day (TID) | ORAL | Status: DC | PRN

## 2015-06-16 MED ORDER — MIDAZOLAM HCL 2 MG/2ML IJ SOLN
INTRAMUSCULAR | Status: AC
  Filled 2015-06-16: qty 2

## 2015-06-16 MED ORDER — ATROPINE SULFATE 0.4 MG/ML IJ SOLN
INTRAMUSCULAR | Status: AC
  Filled 2015-06-16: qty 1

## 2015-06-16 MED ORDER — TRANEXAMIC ACID 1000 MG/10ML IV SOLN
1000.0000 mg | INTRAVENOUS | Status: AC
  Administered 2015-06-16: 1000 mg via INTRAVENOUS
  Filled 2015-06-16: qty 10

## 2015-06-16 MED ORDER — DEXAMETHASONE SODIUM PHOSPHATE 10 MG/ML IJ SOLN
INTRAMUSCULAR | Status: AC
  Filled 2015-06-16: qty 1

## 2015-06-16 MED ORDER — ACETAMINOPHEN 500 MG PO TABS
ORAL_TABLET | ORAL | Status: AC
  Filled 2015-06-16: qty 2

## 2015-06-16 MED ORDER — METHOCARBAMOL 1000 MG/10ML IJ SOLN
500.0000 mg | Freq: Four times a day (QID) | INTRAVENOUS | Status: DC | PRN
  Administered 2015-06-16: 500 mg via INTRAVENOUS
  Filled 2015-06-16 (×2): qty 5

## 2015-06-16 MED ORDER — DOCUSATE SODIUM 100 MG PO CAPS
100.0000 mg | ORAL_CAPSULE | Freq: Two times a day (BID) | ORAL | Status: DC
  Administered 2015-06-16 – 2015-06-19 (×6): 100 mg via ORAL

## 2015-06-16 MED ORDER — HYDROMORPHONE HCL 1 MG/ML IJ SOLN
0.2500 mg | INTRAMUSCULAR | Status: DC | PRN
  Administered 2015-06-16 (×3): 0.5 mg via INTRAVENOUS

## 2015-06-16 MED ORDER — HYDROMORPHONE HCL 2 MG/ML IJ SOLN
INTRAMUSCULAR | Status: AC
  Filled 2015-06-16: qty 1

## 2015-06-16 MED ORDER — HYDROCHLOROTHIAZIDE 25 MG PO TABS
25.0000 mg | ORAL_TABLET | Freq: Every day | ORAL | Status: DC
  Administered 2015-06-17 – 2015-06-19 (×3): 25 mg via ORAL
  Filled 2015-06-16 (×3): qty 1

## 2015-06-16 MED ORDER — FENTANYL CITRATE (PF) 100 MCG/2ML IJ SOLN
INTRAMUSCULAR | Status: DC | PRN
  Administered 2015-06-16 (×6): 50 ug via INTRAVENOUS

## 2015-06-16 MED ORDER — ALUM & MAG HYDROXIDE-SIMETH 200-200-20 MG/5ML PO SUSP: 30.0000 mL | ORAL | Status: DC | PRN

## 2015-06-16 MED ORDER — HYPROMELLOSE (GONIOSCOPIC) 2.5 % OP SOLN: 1.0000 [drp] | Freq: Three times a day (TID) | OPHTHALMIC | Status: DC | PRN

## 2015-06-16 MED ORDER — ONDANSETRON HCL 4 MG/2ML IJ SOLN: 4.0000 mg | Freq: Four times a day (QID) | INTRAMUSCULAR | Status: DC | PRN

## 2015-06-16 MED ORDER — SODIUM CHLORIDE 0.9 % IR SOLN
Status: DC | PRN
  Administered 2015-06-16 (×2): 1000 mL

## 2015-06-16 MED ORDER — FLUOXETINE HCL 40 MG PO CAPS
40.0000 mg | ORAL_CAPSULE | Freq: Every day | ORAL | Status: DC
  Administered 2015-06-17 – 2015-06-19 (×3): 40 mg via ORAL
  Filled 2015-06-16 (×3): qty 1

## 2015-06-16 MED ORDER — VITAMIN C 500 MG PO TABS
1000.0000 mg | ORAL_TABLET | Freq: Every day | ORAL | Status: DC
  Administered 2015-06-17 – 2015-06-19 (×3): 1000 mg via ORAL
  Filled 2015-06-16 (×3): qty 2

## 2015-06-16 MED ORDER — LIDOCAINE HCL (CARDIAC) 20 MG/ML IV SOLN
INTRAVENOUS | Status: DC | PRN
  Administered 2015-06-16: 100 mg via INTRAVENOUS

## 2015-06-16 MED ORDER — ACETAMINOPHEN 650 MG RE SUPP: 650.0000 mg | Freq: Four times a day (QID) | RECTAL | Status: DC | PRN

## 2015-06-16 MED ORDER — PHENYLEPHRINE 40 MCG/ML (10ML) SYRINGE FOR IV PUSH (FOR BLOOD PRESSURE SUPPORT)
PREFILLED_SYRINGE | INTRAVENOUS | Status: AC
  Filled 2015-06-16: qty 10

## 2015-06-16 MED ORDER — SUCCINYLCHOLINE CHLORIDE 20 MG/ML IJ SOLN
INTRAMUSCULAR | Status: DC | PRN
  Administered 2015-06-16: 100 mg via INTRAVENOUS

## 2015-06-16 MED ORDER — ONDANSETRON HCL 4 MG/2ML IJ SOLN
INTRAMUSCULAR | Status: DC | PRN
  Administered 2015-06-16: 4 mg via INTRAVENOUS

## 2015-06-16 MED ORDER — CEFAZOLIN SODIUM-DEXTROSE 2-3 GM-% IV SOLR
INTRAVENOUS | Status: AC
  Filled 2015-06-16: qty 50

## 2015-06-16 MED ORDER — ONDANSETRON HCL 4 MG/2ML IJ SOLN: 4.0000 mg | Freq: Once | INTRAMUSCULAR | Status: DC | PRN

## 2015-06-16 MED ORDER — WARFARIN SODIUM 7.5 MG PO TABS
7.5000 mg | ORAL_TABLET | Freq: Once | ORAL | Status: AC
  Administered 2015-06-16: 7.5 mg via ORAL
  Filled 2015-06-16: qty 1

## 2015-06-16 MED ORDER — CEFAZOLIN SODIUM-DEXTROSE 2-3 GM-% IV SOLR
2.0000 g | INTRAVENOUS | Status: AC
  Administered 2015-06-16: 2 g via INTRAVENOUS

## 2015-06-16 MED ORDER — HYDROMORPHONE HCL 1 MG/ML IJ SOLN
1.0000 mg | INTRAMUSCULAR | Status: DC | PRN
  Administered 2015-06-16 – 2015-06-17 (×9): 1 mg via INTRAVENOUS
  Filled 2015-06-16 (×9): qty 1

## 2015-06-16 SURGICAL SUPPLY — 50 items
APL SKNCLS STERI-STRIP NONHPOA (GAUZE/BANDAGES/DRESSINGS) ×1
BAG SPEC THK2 15X12 ZIP CLS (MISCELLANEOUS)
BAG ZIPLOCK 12X15 (MISCELLANEOUS) IMPLANT
BANDAGE ELASTIC 4 VELCRO ST LF (GAUZE/BANDAGES/DRESSINGS) ×1 IMPLANT
BANDAGE ELASTIC 6 VELCRO ST LF (GAUZE/BANDAGES/DRESSINGS) ×2 IMPLANT
BENZOIN TINCTURE PRP APPL 2/3 (GAUZE/BANDAGES/DRESSINGS) ×1 IMPLANT
BLADE SAG 13.0X1.37X90 (BLADE) IMPLANT
BLADE SAG 18X100X1.27 (BLADE) ×1 IMPLANT
BOWL SMART MIX CTS (DISPOSABLE) ×2 IMPLANT
CAPT KNEE TOTAL 3 ×1 IMPLANT
CEMENT BONE 1-PACK (Cement) ×4 IMPLANT
CLOTH BEACON ORANGE TIMEOUT ST (SAFETY) ×2 IMPLANT
CUFF TOURN SGL QUICK 34 (TOURNIQUET CUFF) ×2
CUFF TRNQT CYL 34X4X40X1 (TOURNIQUET CUFF) ×1 IMPLANT
DRAPE U-SHAPE 47X51 STRL (DRAPES) ×2 IMPLANT
DRSG AQUACEL AG ADV 3.5X10 (GAUZE/BANDAGES/DRESSINGS) ×2 IMPLANT
DRSG PAD ABDOMINAL 8X10 ST (GAUZE/BANDAGES/DRESSINGS) ×2 IMPLANT
DURAPREP 26ML APPLICATOR (WOUND CARE) ×2 IMPLANT
ELECT REM PT RETURN 9FT ADLT (ELECTROSURGICAL) ×2
ELECTRODE REM PT RTRN 9FT ADLT (ELECTROSURGICAL) ×1 IMPLANT
GAUZE SPONGE 4X4 12PLY STRL (GAUZE/BANDAGES/DRESSINGS) ×2 IMPLANT
GAUZE XEROFORM 1X8 LF (GAUZE/BANDAGES/DRESSINGS) IMPLANT
GLOVE BIO SURGEON STRL SZ7.5 (GLOVE) ×4 IMPLANT
GLOVE BIOGEL PI IND STRL 8 (GLOVE) ×2 IMPLANT
GLOVE BIOGEL PI INDICATOR 8 (GLOVE) ×3
GLOVE ECLIPSE 8.0 STRL XLNG CF (GLOVE) ×2 IMPLANT
GOWN STRL REUS W/TWL XL LVL3 (GOWN DISPOSABLE) ×4 IMPLANT
HANDPIECE INTERPULSE COAX TIP (DISPOSABLE) ×2
IMMOBILIZER KNEE 20 (SOFTGOODS) ×2
IMMOBILIZER KNEE 20 THIGH 36 (SOFTGOODS) ×1 IMPLANT
NS IRRIG 1000ML POUR BTL (IV SOLUTION) ×2 IMPLANT
PACK TOTAL KNEE CUSTOM (KITS) ×2 IMPLANT
PADDING CAST COTTON 6X4 STRL (CAST SUPPLIES) ×4 IMPLANT
POSITIONER SURGICAL ARM (MISCELLANEOUS) ×2 IMPLANT
SET HNDPC FAN SPRY TIP SCT (DISPOSABLE) ×1 IMPLANT
SET PAD KNEE POSITIONER (MISCELLANEOUS) ×2 IMPLANT
STAPLER VISISTAT 35W (STAPLE) IMPLANT
STRIP CLOSURE SKIN 1/2X4 (GAUZE/BANDAGES/DRESSINGS) ×2 IMPLANT
SUCTION FRAZIER 12FR DISP (SUCTIONS) ×1 IMPLANT
SUT MNCRL AB 4-0 PS2 18 (SUTURE) ×1 IMPLANT
SUT VIC AB 0 CT1 27 (SUTURE) ×4
SUT VIC AB 0 CT1 27XBRD ANTBC (SUTURE) ×1 IMPLANT
SUT VIC AB 1 CT1 27 (SUTURE) ×6
SUT VIC AB 1 CT1 27XBRD ANTBC (SUTURE) ×2 IMPLANT
SUT VIC AB 2-0 CT1 27 (SUTURE) ×4
SUT VIC AB 2-0 CT1 TAPERPNT 27 (SUTURE) ×2 IMPLANT
TRAY FOLEY W/METER SILVER 14FR (SET/KITS/TRAYS/PACK) ×2 IMPLANT
WATER STERILE IRR 1500ML POUR (IV SOLUTION) ×2 IMPLANT
WRAP KNEE MAXI GEL POST OP (GAUZE/BANDAGES/DRESSINGS) ×2 IMPLANT
YANKAUER SUCT BULB TIP 10FT TU (MISCELLANEOUS) ×2 IMPLANT

## 2015-06-16 NOTE — H&P (Signed)
TOTAL KNEE ADMISSION H&P  Patient is being admitted for left total knee arthroplasty.  Subjective:  Chief Complaint:left knee pain.  HPI: Leslie [name], 64 y.o. female, has a history of pain and functional disability in the left knee due to arthritis and has failed non-surgical conservative treatments for greater than 12 weeks to includeNSAID's and/or analgesics, corticosteriod injections, flexibility and strengthening excercises, use of assistive devices, weight reduction as appropriate and activity modification.  Onset of symptoms was gradual, starting >10 years ago with gradually worsening course since that time. The patient noted prior procedures on the knee to include  arthroscopy on the left knee(s).  Patient currently rates pain in the left knee(s) at 10 out of 10 with activity. Patient has night pain, worsening of pain with activity and weight bearing, pain that interferes with activities of daily living, pain with passive range of motion, crepitus and joint swelling.  Patient has evidence of subchondral cysts, subchondral sclerosis, periarticular osteophytes and joint space narrowing by imaging studies. There is no active infection.  Patient Active Problem List   Diagnosis Date Noted  . Osteoarthritis of left knee 06/16/2015  . MYALGIA 12/19/2009  . SUBSTANCE ABUSE 02/06/2009  . KNEE PAIN, LEFT 10/06/2008  . URI 07/11/2008  . ACUTE MAXILLARY SINUSITIS 03/17/2008  . ADD 09/14/2007  . COLONIC POLYPS, HX OF 08/03/2007  . Essential hypertension 01/20/2007  . OBESITY NOS 12/04/2006  . Allergic rhinitis 12/04/2006  . GASTROJEJUNOSTOMY, HX OF 12/04/2006  . BREAST BIOPSY, HX OF 12/04/2006   Past Medical History  Diagnosis Date  . Allergy   . Hypertension   . Obesity     post gastric bypass  . Colon polyps   . ADD (attention deficit disorder)   . Left knee pain     chronic  . Narcotic addiction (HCC)   . Vitamin D deficiency   . Arthritis 2016    osteoarthritis left knee    Past Surgical History  Procedure Laterality Date  . Gastric bypass  2003  . Cholecystectomy    . Kidney donation  2004  . Breast surgery  years ago    breast biopsy  . Arthscopic knee surgery Left     several times  . Eye surgery Bilateral 2013    Toric Lens implants  . Eye surgery Right 2014    corneal amniotic membrane    Prescriptions prior to admission  Medication Sig Dispense Refill Last Dose  . amphetamine-dextroamphetamine (ADDERALL XR) 30 MG 24 hr capsule Take 1 capsule (30 mg total) by mouth every morning. 30 capsule 0   . amphetamine-dextroamphetamine (ADDERALL) 20 MG tablet take 1/2 to 1 tablet by mouth every evening if needed 30 tablet 0 06/15/2015 at 1500  . Ascorbic Acid (VITAMIN C) 1000 MG tablet Take 1,000 mg by mouth daily.    06/09/2015  . calcium-vitamin D (SM CALCIUM 500/VITAMIN D3) 500-400 MG-UNIT tablet Take 1 tablet by mouth daily.    06/09/2015  . FLUoxetine (PROZAC) 40 MG capsule take 1 capsule by mouth once daily (Patient taking differently: Take  by mouth once daily) 90 capsule 0 06/16/2015 at 0500  . gabapentin (NEURONTIN) 300 MG capsule Take 300 mg by mouth daily as needed (shingles pain).    06/15/2015 at 0930  . hydrochlorothiazide (HYDRODIURIL) 25 MG tablet take 1 tablet by mouth once daily (Patient taking differently: Take  by mouth once daily) 90 tablet 3 06/15/2015 at 0930  . hydroxypropyl methylcellulose / hypromellose (ISOPTO TEARS / GONIOVISC) 2.5 % ophthalmic  solution Place 1 drop into both eyes 3 (three) times daily as needed for dry eyes.   06/16/2015 at 0500  . lamoTRIgine (LAMICTAL) 100 MG tablet Take 200 mg by mouth daily.    06/16/2015 at 0500  . triamcinolone (NASACORT) 55 MCG/ACT nasal inhaler Place 2 sprays into the nose daily. (Patient taking differently: Place 2 sprays into the nose daily as needed (allergies). ) 1 Inhaler 6 06/12/2015  . zolpidem (AMBIEN) 10 MG tablet take 1 tablet by mouth once daily at bedtime for sleep (Patient  taking differently: Take 10mg  by mouth once daily at bedtime for sleep) 30 tablet 2 06/14/2015 at 0030   Allergies  Allergen Reactions  . Sulfamethoxazole Nausea And Vomiting    See patient list of med intolerances due to h/o gastric bypass and nephrectomy    Social History  Substance Use Topics  . Smoking status: Former Smoker    Quit date: 07/22/1992  . Smokeless tobacco: Never Used  . Alcohol Use: Yes     Comment: once every 6 months    Family History  Problem Relation Age of Onset  . Heart disease Mother     post CABG history of CHF  . COPD Father   . Diabetes Sister   . Hypertension Sister     post renal transplant  . Heart disease Brother     CAD     Review of Systems  Musculoskeletal: Positive for joint pain.  All other systems reviewed and are negative.   Objective:  Physical Exam  Constitutional: She is oriented to person, place, and time. She appears well-developed and well-nourished.  HENT:  Head: Normocephalic and atraumatic.  Eyes: Conjunctivae are normal. Pupils are equal, round, and reactive to light.  Neck: Normal range of motion. Neck supple.  Cardiovascular: Normal rate and regular rhythm.   Respiratory: Effort normal and breath sounds normal.  GI: Soft. Bowel sounds are normal.  Musculoskeletal:       Left knee: She exhibits decreased range of motion, swelling and effusion. Tenderness found. Medial joint line and lateral joint line tenderness noted.  Neurological: She is alert and oriented to person, place, and time.  Skin: Skin is warm and dry.  Psychiatric: She has a normal mood and affect.    Vital signs in last 24 hours: Temp:  [97.7 F (36.5 C)] 97.7 F (36.5 C) (11/25 0630) Pulse Rate:  [78] 78 (11/25 0630) Resp:  [16] 16 (11/25 0630) BP: (134)/(81) 134/81 mmHg (11/25 0630) SpO2:  [98 %] 98 % (11/25 0630) Weight:  [92.534 kg (204 lb)] 92.534 kg (204 lb) (11/25 0630)  Labs:   Estimated body mass index is 33.95 kg/(m^2) as  calculated from the following:   Height as of this encounter: 5\' 5"  (1.651 m).   Weight as of this encounter: 92.534 kg (204 lb).   Imaging Review Plain radiographs demonstrate severe degenerative joint disease of the left knee(s). The overall alignment ismild varus. The bone quality appears to be good for age and reported activity level.  Assessment/Plan:  End stage arthritis, left knee   The patient history, physical examination, clinical judgment of the provider and imaging studies are consistent with end stage degenerative joint disease of the left knee(s) and total knee arthroplasty is deemed medically necessary. The treatment options including medical management, injection therapy arthroscopy and arthroplasty were discussed at length. The risks and benefits of total knee arthroplasty were presented and reviewed. The risks due to aseptic loosening, infection, stiffness, patella tracking problems,  thromboembolic complications and other imponderables were discussed. The patient acknowledged the explanation, agreed to proceed with the plan and consent was signed. Patient is being admitted for inpatient treatment for surgery, pain control, PT, OT, prophylactic antibiotics, VTE prophylaxis, progressive ambulation and ADL's and discharge planning. The patient is planning to be discharged home with home health services

## 2015-06-16 NOTE — Progress Notes (Signed)
ANTICOAGULATION CONSULT NOTE - Initial Consult  Pharmacy Consult for coumadin Indication: VTE prophylaxis  Allergies  Allergen Reactions  . Sulfamethoxazole Nausea And Vomiting    See patient list of med intolerances due to h/o gastric bypass and nephrectomy    Patient Measurements: Height: 5\' 5"  (165.1 cm) Weight: 204 lb (92.534 kg) IBW/kg (Calculated) : 57  Vital Signs: Temp: 97.8 F (36.6 C) (11/25 1241) Temp Source: Oral (11/25 1241) BP: 141/85 mmHg (11/25 1241) Pulse Rate: 77 (11/25 1241)  Labs: No results for input(s): HGB, HCT, PLT, APTT, LABPROT, INR, HEPARINUNFRC, CREATININE, CKTOTAL, CKMB, TROPONINI in the last 72 hours.  Estimated Creatinine Clearance: 58.1 mL/min (by C-G formula based on Cr of 1.1).   Medical History: Past Medical History  Diagnosis Date  . Allergy   . Hypertension   . Obesity     post gastric bypass  . Colon polyps   . ADD (attention deficit disorder)   . Left knee pain     chronic  . Narcotic addiction (HCC)   . Vitamin D deficiency   . Arthritis 2016    osteoarthritis left knee   Assessment: Patient's a 64 y.o F s/p L TKA on 06/16/15-- to start coumadin for VTE px. - pre-op INR 1.01 on 11/10 and cbc ok  Goal of Therapy:  INR 2-3 Monitor platelets by anticoagulation protocol: Yes   Plan:  - coumadin 7.5 mg PO x1 today - daily INR - monitor for s/s bleeding  Tonishia Steffy P 06/16/2015,1:16 PM

## 2015-06-16 NOTE — Op Note (Signed)
NAMEBROCK, Leslie [name] NO.:  [id number]  MEDICAL RECORD NO.:  [id number]  LOCATION:  [facility]                         FACILITY:  [facility]  PHYSICIAN:  Leslie [name]. Magnus Ivan, M.D.DATE OF BIRTH:  1951-02-02  DATE OF PROCEDURE:  06/16/2015 DATE OF DISCHARGE:                              OPERATIVE REPORT   PREOPERATIVE DIAGNOSIS:  Primary osteoarthritis and degenerative joint disease, left knee.  POSTOPERATIVE DIAGNOSIS:  Primary osteoarthritis and degenerative joint disease, left knee.  PROCEDURE:  Left total knee arthroplasty.  IMPLANTS:  Stryker Triathlon knee with size 4 femur, size 4 tibial universal base plate, size 11 fixed bearing polyethylene insert, size 28 8 mm patellar button.  SURGEON:  Kathryne Hitch, M.D.  ASSISTANT:  Richardean Canal, Leslie [name].  ANESTHESIA:  General.  ANTIBIOTICS:  3 g IV Ancef.  BLOOD LOSS:  Less than 200 mL.  TOURNIQUET TIME:  75 minutes.  COMPLICATIONS:  None.  INDICATIONS:  Leslie [name] is well known to me.  She is a 64 year old female with debilitating arthritis involving her left knee.  She has had multiple arthroscopic interventions in the right knee.  She has failed all forms of conservative treatment and has a knee that shows a significant varus deformity.  There was a complete loss of medial joint space with periarticular osteophytes throughout the knee and patellofemoral disease which were quite severe.  Her pain is daily. Her activities of daily living and mobility have been detrimentally affected due to her left knee pain.  She understands our goals are to decrease pain, improved mobility, and overall improved quality of life and the risks of surgery include acute blood loss anemia, nerve and vessel injury, fracture, infection, and DVT.  She understands.  PROCEDURE DESCRIPTION:  After informed consent was obtained and appropriate left knee was marked, she was brought to the operating room, placed supine on  the operating table.  General anesthesia was then obtained.  A nonsterile tourniquet placed around her upper left leg thigh and her left leg was prepped and draped with DuraPrep and sterile drapes and a sterile stockinette.  A time-out was called to identify the correct patient, correct left knee.  We then used an Esmarch to wrap out the leg and tourniquet was inflated to 300 mm of pressure.  We made direct midline incision over the patella and carried this proximally and distally.  We dissected down the knee joint and carried out a medial parapatellar arthrotomy.  We then exposed the knee joint.  We removed remnants of ACL, PCL, medial and lateral meniscus as well as osteophytes throughout the knee.  It appears she may have had a previous ACL reconstruction before.  Once all these were removed, all debris and remnants of ACL and meniscus were removed from the knee.  We had the knee in a flexed position and used extramedullary cutting guide on the tibia.  We took 9 mm off the high side.  We then used the intramedullary guide for the femur drilling though the intramedullary notch and took 10 mm off our distal femoral cut correcting for varus and valgus on the tibia and for the distal femoral cut using a log that was 5 degrees externally rotated.  We then brought our knee back down in extension with a 9 mm extension block,  She actually hyperextend a little bit.  We then went back to the femur and then chose our femoral sizing guide off the epicondylar axis at 3 degrees left and then chose a size 4 femur. We put our 4-in-1 cutting block for a size 4 femur and made our anterior posterior cuts followed by our chamfer cuts.  We then made our femoral box cut.  Attention went back to the tibia and we chose a size 4 tibia. We set our rotation off the tibial tubercle in the femur and was were able to choose a size 4 tibia.  We made our keel cut off this as well. the post for universal base plate  using a drill. With the trial 4 tibia and trial 4 femur and placed a trial 11 mm polyethylene insert, I was pleased with stability and range of motion.  We then made our patella cut for a size 28 patellar button.  We then removed all trial components and irrigated the knee in normal saline solution using pulsatile lavage. We then infiltrated the joint capsule with a mixture of 20 mL of Exparel and 40 mL of normal saline.  We then mixed our cement and cemented the real Stryker Triathlon tibial tray, universal base plate size 4, followed by the real size 4 femur.  We placed the real 11 mm fixed bearing polyethylene insert and cemented the patellar button.  Once our cement had hardened, we let the tourniquet down and hemostasis was obtained.  We irrigated the knee again with 1 L normal saline solution and then closed the arthrotomy with interrupted #1 Vicryl followed by 0 Vicryl in the deep tissue, 2- 0 Vicryl in subcutaneous tissue, 4-0 Monocryl subcuticular stitch, and Steri-Strips on the skin.  Well-padded sterile dressing was applied.  She was awakened, extubated, and taken to the recovery room in stable condition.  All final counts were correct. There were no complications noted.  Of note, Richardean CanalGilbert Clark, Leslie [name] assisted in the entire case.  His assistance was crucial for facilitating all aspects of this case.     Leslie Pandahristopher Y. Magnus IvanBlackman, M.D.     CYB/MEDQ  D:  06/16/2015  T:  06/16/2015  Job:  [id number]

## 2015-06-16 NOTE — Anesthesia Postprocedure Evaluation (Signed)
Anesthesia Post Note  Patient: Leslie [name]  Procedure(s) Performed: Procedure(s) (LRB): LEFT TOTAL KNEE ARTHROPLASTY (Left)  Patient location during evaluation: PACU Anesthesia Type: General Level of consciousness: awake and alert Pain management: pain level controlled Vital Signs Assessment: post-procedure vital signs reviewed and stable Respiratory status: spontaneous breathing, nonlabored ventilation, respiratory function stable and patient connected to nasal cannula oxygen Cardiovascular status: blood pressure returned to baseline and stable Postop Assessment: No signs of nausea or vomiting Anesthetic complications: no    Last Vitals:  Filed Vitals:   06/16/15 1015 06/16/15 1030  BP: 137/89 132/101  Pulse: 88 89  Temp:    Resp: 10 17    Last Pain:  Filed Vitals:   06/16/15 1039  PainSc: 8     LLE Motor Response: Purposeful movement LLE Sensation: Full sensation RLE Motor Response: Purposeful movement RLE Sensation: Full sensation      Nichele Slawson JENNETTE

## 2015-06-16 NOTE — Evaluation (Signed)
Physical Therapy Evaluation Patient Details Name: Leslie [name] MRN: [id number] DOB: 1950-11-18 Today's Date: 06/16/2015   History of Present Illness  L TKR  Clinical Impression  Pt s/p L TKR presents with decreased L LE strength/ROM and post op pain limiting functional mobility.  Pt should progress to dc home with family assist and HHPT follow up.    Follow Up Recommendations Home health PT    Equipment Recommendations  Rolling walker with 5" wheels    Recommendations for Other Services OT consult     Precautions / Restrictions Precautions Precautions: Fall;Knee Required Braces or Orthoses: Knee Immobilizer - Left Knee Immobilizer - Left: Discontinue once straight leg raise with < 10 degree lag Restrictions Weight Bearing Restrictions: No Other Position/Activity Restrictions: WBAT      Mobility  Bed Mobility Overal bed mobility: Needs Assistance;+2 for physical assistance;+ 2 for safety/equipment Bed Mobility: Supine to Sit;Sit to Supine     Supine to sit: Min assist;Mod assist;+2 for safety/equipment Sit to supine: Min assist;Mod assist;+2 for safety/equipment   General bed mobility comments: cues for sequence and use of R LE to self assist  Transfers Overall transfer level: Needs assistance Equipment used: Rolling walker (2 wheeled) Transfers: Sit to/from Stand Sit to Stand: Min assist;+2 physical assistance;+2 safety/equipment;From elevated surface         General transfer comment: cues for LE management and use of UEs to self assist  Ambulation/Gait Ambulation/Gait assistance: Min assist;+2 physical assistance;+2 safety/equipment Ambulation Distance (Feet): 26 Feet Assistive device: Rolling walker (2 wheeled) Gait Pattern/deviations: Step-to pattern;Decreased step length - right;Decreased step length - left;Shuffle;Trunk flexed Gait velocity: decr Gait velocity interpretation: Below normal speed for age/gender General Gait Details: cues for  sequence, posture and position from [employer]  Stairs            Wheelchair Mobility    Modified Rankin (Stroke Patients Only)       Balance                                             Pertinent Vitals/Pain Pain Assessment: 0-10 Pain Score: 6  Pain Location: L knee Pain Descriptors / Indicators: Aching;Sore Pain Intervention(s): Limited activity within patient's tolerance;Monitored during session;Premedicated before session;Ice applied    Home Living Family/patient expects to be discharged to:: Private residence Living Arrangements: Spouse/significant other Available Help at Discharge: Family Type of Home: House Home Access: Stairs to enter Entrance Stairs-Rails: Right Entrance Stairs-Number of Steps: 8 Home Layout: One level Home Equipment: None      Prior Function Level of Independence: Independent               Hand Dominance        Extremity/Trunk Assessment   Upper Extremity Assessment: Overall WFL for tasks assessed           Lower Extremity Assessment: LLE deficits/detail      Cervical / Trunk Assessment: Normal  Communication   Communication: No difficulties  Cognition Arousal/Alertness: Awake/alert Behavior During Therapy: WFL for tasks assessed/performed Overall Cognitive Status: Within Functional Limits for tasks assessed                      General Comments      Exercises        Assessment/Plan    PT Assessment Patient needs continued PT services  PT Diagnosis Difficulty walking  PT Problem List Decreased strength;Decreased range of motion;Decreased activity tolerance;Decreased mobility;Decreased knowledge of use of DME;Pain;Obesity  PT Treatment Interventions DME instruction;Gait training;Stair training;Functional mobility training;Therapeutic activities;Therapeutic exercise;Patient/family education   PT Goals (Current goals can be found in the Care Plan section) Acute Rehab PT Goals Patient  Stated Goal: Resume previous lifestyle with decreased pain PT Goal Formulation: With patient Time For Goal Achievement: 06/21/15 Potential to Achieve Goals: Good    Frequency 7X/week   Barriers to discharge        Co-evaluation               End of Session Equipment Utilized During Treatment: Gait belt;Left knee immobilizer Activity Tolerance: Patient tolerated treatment well;Patient limited by fatigue Patient left: in bed;with call bell/phone within reach;with family/visitor present Nurse Communication: Mobility status         Time: 7846-9629 PT Time Calculation (min) (ACUTE ONLY): 32 min   Charges:   PT Evaluation $Initial PT Evaluation Tier I: 1 Procedure PT Treatments $Gait Training: 8-22 mins   PT G Codes:        Gwendlyn Hanback 06-24-15, 5:02 PM

## 2015-06-16 NOTE — Addendum Note (Signed)
Addendum  created 06/16/15 1148 by Epimenio SarinJoshua R Camp Gopal, CRNA   Modules edited: Flowsheet VN   Flowsheet VN:  [id number] SCORE

## 2015-06-16 NOTE — Brief Op Note (Signed)
06/16/2015  9:31 AM  PATIENT:  Leslie [name]  64 y.o. female  PRE-OPERATIVE DIAGNOSIS:  Osteoarthritis left knee  POST-OPERATIVE DIAGNOSIS:  Osteoarthritis left knee  PROCEDURE:  Procedure(s): LEFT TOTAL KNEE ARTHROPLASTY (Left)  SURGEON:  Surgeon(s) and Role:    * Kathryne Hitchhristopher Y Vern Prestia, MD - Primary  PHYSICIAN ASSISTANT: Rexene EdisonGil Clark, PA-C  ANESTHESIA:   local and general  EBL:  Total I/O In: 1000 [I.V.:1000] Out: 100 [Urine:100]  BLOOD ADMINISTERED:none  DRAINS: none   LOCAL MEDICATIONS USED:  OTHER Experil   SPECIMEN:  No Specimen  DISPOSITION OF SPECIMEN:  N/A  COUNTS:  YES  TOURNIQUET:   Total Tourniquet Time Documented: Thigh (Left) - 76 minutes Total: Thigh (Left) - 76 minutes   DICTATION: .Other Dictation: Dictation Number [id number]  PLAN OF CARE: Admit to inpatient   PATIENT DISPOSITION:  PACU - hemodynamically stable.   Delay start of Pharmacological VTE agent (>24hrs) due to surgical blood loss or risk of bleeding: no

## 2015-06-16 NOTE — Anesthesia Procedure Notes (Signed)
Procedure Name: Intubation Date/Time: 06/16/2015 7:39 AM Performed by: Epimenio SarinJARVELA, Ina Poupard R Pre-anesthesia Checklist: Patient identified, Emergency Drugs available, Suction available, Patient being monitored and Timeout performed Patient Re-evaluated:Patient Re-evaluated prior to inductionOxygen Delivery Method: Circle system utilized Preoxygenation: Pre-oxygenation with 100% oxygen Intubation Type: IV induction Ventilation: Mask ventilation without difficulty and Oral airway inserted - appropriate to patient size Laryngoscope Size: Mac and 3 Grade View: Grade I Tube type: Oral Tube size: 7.0 mm Number of attempts: 1 Airway Equipment and Method: Stylet Placement Confirmation: ETT inserted through vocal cords under direct vision,  positive ETCO2 and breath sounds checked- equal and bilateral Secured at: 21 cm Tube secured with: Tape Dental Injury: Teeth and Oropharynx as per pre-operative assessment

## 2015-06-16 NOTE — Transfer of Care (Addendum)
Immediate Anesthesia Transfer of Care Note  Patient: Leslie [name]  Procedure(s) Performed: Procedure(s): LEFT TOTAL KNEE ARTHROPLASTY (Left)  Patient Location: PACU  Anesthesia Type:General  Level of Consciousness:  sedated, patient cooperative and responds to stimulation  Airway & Oxygen Therapy:Patient Spontanous Breathing and Patient connected to face mask oxgen  Post-op Assessment:  Report given to PACU RN and Post -op Vital signs reviewed and stable  Post vital signs:  Reviewed and stable  Last Vitals:  Filed Vitals:   06/16/15 0630  BP: 134/81  Pulse: 78  Temp: 36.5 C  Resp: 16    Complications: No apparent anesthesia complications

## 2015-06-16 NOTE — Progress Notes (Signed)
Pt states that because of Gastric Bypass history she can not tolerate blind insertion of NGT.  Must be scoped for NGT insertion. Also extensive hard copy list of meds to avoid posted on chart.

## 2015-06-17 LAB — BASIC METABOLIC PANEL
Anion gap: 5 (ref 5–15)
BUN: 10 mg/dL (ref 6–20)
CALCIUM: 9 mg/dL (ref 8.9–10.3)
CO2: 31 mmol/L (ref 22–32)
CREATININE: 1.07 mg/dL — AB (ref 0.44–1.00)
Chloride: 99 mmol/L — ABNORMAL LOW (ref 101–111)
GFR calc Af Amer: >60 mL/min (ref >60)
GFR, EST NON AFRICAN AMERICAN: 54 mL/min — AB (ref >60)
GLUCOSE: 119 mg/dL — AB (ref 65–99)
Potassium: 4 mmol/L (ref 3.5–5.1)
Sodium: 135 mmol/L (ref 135–145)

## 2015-06-17 LAB — CBC
HEMATOCRIT: 29.5 % — AB (ref 36.0–46.0)
Hemoglobin: 8.7 g/dL — ABNORMAL LOW (ref 12.0–15.0)
MCH: 22.6 pg — AB (ref 26.0–34.0)
MCHC: 29.5 g/dL — ABNORMAL LOW (ref 30.0–36.0)
MCV: 76.6 fL — AB (ref 78.0–100.0)
PLATELETS: 302 10*3/uL (ref 150–400)
RBC: 3.85 MIL/uL — AB (ref 3.87–5.11)
RDW: 15.8 % — AB (ref 11.5–15.5)
WBC: 6.4 10*3/uL (ref 4.0–10.5)

## 2015-06-17 LAB — PROTIME-INR
INR: 1.1 (ref 0.00–1.49)
Prothrombin Time: 14.4 seconds (ref 11.6–15.2)

## 2015-06-17 MED ORDER — WARFARIN SODIUM 7.5 MG PO TABS
7.5000 mg | ORAL_TABLET | Freq: Once | ORAL | Status: AC
  Administered 2015-06-17: 7.5 mg via ORAL
  Filled 2015-06-17: qty 1

## 2015-06-17 NOTE — Progress Notes (Signed)
OT Cancellation Note  Patient Details Name: Caro HightKaylene W Marcucci MRN: [id number] DOB: Dec 14, 1950   Cancelled Treatment:    Reason Eval/Treat Not Completed: Pain limiting ability to participate. Will check back in am.  Elpidio Thielen 06/17/2015, 1:21 PM  Marica OtterMaryellen Malene Blaydes, OTR/L [phone] 06/17/2015

## 2015-06-17 NOTE — Progress Notes (Signed)
ANTICOAGULATION CONSULT NOTE - Follow up  Pharmacy Consult for Coumadin Indication: VTE prophylaxis  Allergies  Allergen Reactions  . Sulfamethoxazole Nausea And Vomiting    See patient list of med intolerances due to h/o gastric bypass and nephrectomy    Patient Measurements: Height: 5\' 5"  (165.1 cm) Weight: 204 lb (92.534 kg) IBW/kg (Calculated) : 57  Vital Signs: Temp: 98.8 F (37.1 C) (11/26 1342) Temp Source: Oral (11/26 1342) BP: 123/63 mmHg (11/26 1342) Pulse Rate: 85 (11/26 1342)  Labs:  Recent Labs  06/17/15 0450  HGB 8.7*  HCT 29.5*  PLT 302  LABPROT 14.4  INR 1.10  CREATININE 1.07*    Estimated Creatinine Clearance: 59.7 mL/min (by C-G formula based on Cr of 1.07).   Medical History: Past Medical History  Diagnosis Date  . Allergy   . Hypertension   . Obesity     post gastric bypass  . Colon polyps   . ADD (attention deficit disorder)   . Left knee pain     chronic  . Narcotic addiction (HCC)   . Vitamin D deficiency   . Arthritis 2016    osteoarthritis left knee   Assessment: 63yo F s/p L TKA on 06/16/15 -- to start coumadin for VTE ppx. - INR up slightly from preop after 7.5mg  yesterday.  - CBC is ok. No bleeding reported/documented. - Regular diet: tolerating - Potential drug-interactions: none  Goal of Therapy:  INR 2-3 Monitor platelets by anticoagulation protocol: Yes   Plan:  - Repeat Coumadin 7.5 mg PO x1 today - Daily INR - Monitor for s/s bleeding - Counseled patient and daughter on Coumadin.  Charolotte Ekeom Roel Douthat, PharmD, pager [phone]. 06/17/2015,2:01 PM.

## 2015-06-17 NOTE — Progress Notes (Signed)
Subjective: 1 Day Post-Op Procedure(s) (LRB): LEFT TOTAL KNEE ARTHROPLASTY (Left) Patient reports pain as 7 on 0-10 scale.    Objective: Vital signs in last 24 hours: Temp:  [97.8 F (36.6 C)-98.5 F (36.9 C)] 98.3 F (36.8 C) (11/26 0631) Pulse Rate:  [67-99] 85 (11/26 0631) Resp:  [10-17] 14 (11/26 0631) BP: (111-148)/(54-101) 111/54 mmHg (11/26 0631) SpO2:  [98 %-100 %] 100 % (11/26 0631) Weight:  [92.534 kg (204 lb)] 92.534 kg (204 lb) (11/25 1140)  Intake/Output from previous day: 11/25 0701 - 11/26 0700 In: 4827.5 [P.O.:1080; I.V.:3597.5; IV Piggyback:150] Out: 2425 [Urine:2375; Blood:50] Intake/Output this shift:     Recent Labs  06/17/15 0450  HGB 8.7*    Recent Labs  06/17/15 0450  WBC 6.4  RBC 3.85*  HCT 29.5*  PLT 302    Recent Labs  06/17/15 0450  NA 135  K 4.0  CL 99*  CO2 31  BUN 10  CREATININE 1.07*  GLUCOSE 119*  CALCIUM 9.0    Recent Labs  06/17/15 0450  INR 1.10    Neurologically intact  Assessment/Plan: 1 Day Post-Op Procedure(s) (LRB): LEFT TOTAL KNEE ARTHROPLASTY (Left) Up with therapy  Parry Po C 06/17/2015, 8:34 AM

## 2015-06-17 NOTE — Progress Notes (Signed)
Physical Therapy Treatment Patient Details Name: Leslie [name] MRN: [id number] DOB: 1951/01/02 Today's Date: 06/17/2015    History of Present Illness L TKR    PT Comments    Pt cooperative but progressing slowly with mobility - pain ltd  Follow Up Recommendations  Home health PT     Equipment Recommendations  Rolling walker with 5" wheels    Recommendations for Other Services OT consult     Precautions / Restrictions Precautions Precautions: Fall;Knee Required Braces or Orthoses: Knee Immobilizer - Left Knee Immobilizer - Left: Discontinue once straight leg raise with < 10 degree lag Restrictions Weight Bearing Restrictions: No Other Position/Activity Restrictions: WBAT    Mobility  Bed Mobility Overal bed mobility: Needs Assistance;+2 for physical assistance;+ 2 for safety/equipment Bed Mobility: Supine to Sit     Supine to sit: Min assist;Mod assist;+2 for safety/equipment     General bed mobility comments: cues for sequence and use of R LE to self assist  Transfers Overall transfer level: Needs assistance Equipment used: Rolling walker (2 wheeled) Transfers: Sit to/from Stand Sit to Stand: Min assist;Mod assist;+2 physical assistance         General transfer comment: cues for LE management and use of UEs to self assist  Ambulation/Gait Ambulation/Gait assistance: Min assist;Mod assist;+2 safety/equipment Ambulation Distance (Feet): 23 Feet Assistive device: Rolling walker (2 wheeled) Gait Pattern/deviations: Step-to pattern;Decreased step length - right;Decreased step length - left;Shuffle;Trunk flexed Gait velocity: decr   General Gait Details: cues for sequence, posture and position from [employer]   Stairs            Wheelchair Mobility    Modified Rankin (Stroke Patients Only)       Balance                                    Cognition Arousal/Alertness: Awake/alert Behavior During Therapy: WFL for tasks  assessed/performed Overall Cognitive Status: Within Functional Limits for tasks assessed                      Exercises      General Comments        Pertinent Vitals/Pain Pain Assessment: 0-10 Pain Score: 6  Pain Location: L knee/thigh Pain Descriptors / Indicators: Aching;Sore Pain Intervention(s): Limited activity within patient's tolerance;Monitored during session;Premedicated before session;Ice applied    Home Living                      Prior Function            PT Goals (current goals can now be found in the care plan section) Acute Rehab PT Goals Patient Stated Goal: Resume previous lifestyle with decreased pain PT Goal Formulation: With patient Time For Goal Achievement: 06/21/15 Potential to Achieve Goals: Good Progress towards PT goals: Progressing toward goals    Frequency  7X/week    PT Plan Current plan remains appropriate    Co-evaluation             End of Session Equipment Utilized During Treatment: Gait belt;Left knee immobilizer Activity Tolerance: Patient tolerated treatment well;Patient limited by pain Patient left: in chair;with call bell/phone within reach;with family/visitor present     Time: 1002-1032 PT Time Calculation (min) (ACUTE ONLY): 30 min  Charges:  $Gait Training: 8-22 mins $Therapeutic Exercise: 8-22 mins  G Codes:      Leslie [name] June 26, 2015, 1:31 PM

## 2015-06-17 NOTE — Progress Notes (Deleted)
Subjective: 1 Day Post-Op Procedure(s) (LRB): LEFT TOTAL KNEE ARTHROPLASTY (Left) Patient reports pain as moderate.    Objective: Vital signs in last 24 hours: Temp:  [97.8 F (36.6 C)-98.5 F (36.9 C)] 98.3 F (36.8 C) (11/26 0631) Pulse Rate:  [67-99] 85 (11/26 0631) Resp:  [10-17] 14 (11/26 0631) BP: (111-148)/(54-101) 111/54 mmHg (11/26 0631) SpO2:  [98 %-100 %] 100 % (11/26 0631) Weight:  [92.534 kg (204 lb)] 92.534 kg (204 lb) (11/25 1140)  Intake/Output from previous day: 11/25 0701 - 11/26 0700 In: 4827.5 [P.O.:1080; I.V.:3597.5; IV Piggyback:150] Out: 2425 [Urine:2375; Blood:50] Intake/Output this shift:     Recent Labs  06/17/15 0450  HGB 8.7*    Recent Labs  06/17/15 0450  WBC 6.4  RBC 3.85*  HCT 29.5*  PLT 302    Recent Labs  06/17/15 0450  NA 135  K 4.0  CL 99*  CO2 31  BUN 10  CREATININE 1.07*  GLUCOSE 119*  CALCIUM 9.0    Recent Labs  06/17/15 0450  INR 1.10    Neurologically intact  Assessment/Plan: 1 Day Post-Op Procedure(s) (LRB): LEFT TOTAL KNEE ARTHROPLASTY (Left) Up with therapy saline lock  Leslie [name] C 06/17/2015, 8:40 AM

## 2015-06-17 NOTE — Progress Notes (Signed)
OT Cancellation Note  Patient Details Name: [name] MRN: [id number] DOB: [birth year]   Cancelled Treatment:    Reason Eval/Treat Not Completed: Other (comment). Checked on pt earlier this am and pain was too great.  Will check back after next pain medication  Anisia Leija 06/17/2015, 12:21 PM  Marica OtterMaryellen Susi Goslin, OTR/L [phone] 06/17/2015

## 2015-06-17 NOTE — Care Management Note (Signed)
Case Management Note  Patient Details  Name: Leslie [name] MRN: [id number] Date of Birth: December 24, 1950  Subjective/Objective:                  LEFT TOTAL KNEE ARTHROPLASTY   Action/Plan: CM spoke with patient at the bedside. [facility]. [facility] Needs a RW. Jud[facility]. Patient has assistance at home when discharged.   Expected Discharge Date:    06/17/15              Expected Discharge Plan:  Home w Home Health Services  In-House Referral:     Discharge planning Services  CM Consult  Post Acute Care Choice:  Home Health Choice offered to:  Patient  DME Arranged:  Walker rolling DME Agency:  [facility]  [facility]  Status of Service:  Completed, signed off  Medicare Important Message Given:    Date Medicare IM Given:    Medicare IM give by:    Date Additional Medicare IM Given:    Additional Medicare Important Message give by:     If discussed at Long Length of Stay Meetings, dates discussed:    Additional Comments:  Antony HasteBennett, Rhylen Pulido Harris, RN 06/17/2015, 10:02 AM

## 2015-06-18 LAB — CBC
HEMATOCRIT: 26.6 % — AB (ref 36.0–46.0)
HEMOGLOBIN: 7.9 g/dL — AB (ref 12.0–15.0)
MCH: 22.6 pg — ABNORMAL LOW (ref 26.0–34.0)
MCHC: 29.7 g/dL — ABNORMAL LOW (ref 30.0–36.0)
MCV: 76.2 fL — AB (ref 78.0–100.0)
Platelets: 244 10*3/uL (ref 150–400)
RBC: 3.49 MIL/uL — AB (ref 3.87–5.11)
RDW: 16 % — ABNORMAL HIGH (ref 11.5–15.5)
WBC: 7.3 10*3/uL (ref 4.0–10.5)

## 2015-06-18 LAB — PROTIME-INR
INR: 1.57 — AB (ref 0.00–1.49)
Prothrombin Time: 18.8 seconds — ABNORMAL HIGH (ref 11.6–15.2)

## 2015-06-18 MED ORDER — METHOCARBAMOL 500 MG PO TABS
500.0000 mg | ORAL_TABLET | Freq: Four times a day (QID) | ORAL | Status: DC | PRN
Start: 2015-06-18 — End: 2016-07-26

## 2015-06-18 MED ORDER — OXYCODONE-ACETAMINOPHEN 5-325 MG PO TABS: 1.0000 | ORAL_TABLET | ORAL | Status: DC | PRN

## 2015-06-18 MED ORDER — WARFARIN SODIUM 7.5 MG PO TABS
7.5000 mg | ORAL_TABLET | Freq: Once | ORAL | Status: AC
  Administered 2015-06-18: 7.5 mg via ORAL
  Filled 2015-06-18: qty 1

## 2015-06-18 NOTE — Progress Notes (Signed)
Physical Therapy Treatment Patient Details Name: Leslie [name] MRN: [id number] DOB: 1950-10-21 Today's Date: 06/18/2015    History of Present Illness L TKR    PT Comments    Pt motivated and with good progress on mobility this am.  Follow Up Recommendations  Home health PT     Equipment Recommendations  Rolling walker with 5" wheels    Recommendations for Other Services OT consult     Precautions / Restrictions Precautions Precautions: Fall;Knee Required Braces or Orthoses: Knee Immobilizer - Left Knee Immobilizer - Left: Discontinue once straight leg raise with < 10 degree lag Restrictions Weight Bearing Restrictions: No LLE Weight Bearing: Weight bearing as tolerated Other Position/Activity Restrictions: WBAT    Mobility  Bed Mobility Overal bed mobility: Needs Assistance Bed Mobility: Supine to Sit     Supine to sit: Min assist     General bed mobility comments: cues for sequence and use of R LE to self assist - physical assist to manage L LE  Transfers Overall transfer level: Needs assistance Equipment used: Rolling walker (2 wheeled) Transfers: Sit to/from Stand Sit to Stand: Min assist;From elevated surface         General transfer comment: cues for LE management and use of UEs to self assist  Ambulation/Gait Ambulation/Gait assistance: Min assist;+2 safety/equipment Ambulation Distance (Feet): 62 Feet Assistive device: Rolling walker (2 wheeled) Gait Pattern/deviations: Step-to pattern;Decreased step length - right;Decreased step length - left;Shuffle;Trunk flexed Gait velocity: decr   General Gait Details: cues for sequence, posture and position from [employer]            Wheelchair Mobility    Modified Rankin (Stroke Patients Only)       Balance                                    Cognition Arousal/Alertness: Awake/alert Behavior During Therapy: WFL for tasks assessed/performed Overall Cognitive Status:  Within Functional Limits for tasks assessed                      Exercises Total Joint Exercises Ankle Circles/Pumps: AROM;Both;15 reps;Supine Quad Sets: AROM;Both;15 reps;Supine Heel Slides: AAROM;15 reps;Left;Supine Straight Leg Raises: AAROM;Left;15 reps;Supine Goniometric ROM: AAROM L knee -10 - 40    General Comments        Pertinent Vitals/Pain Pain Assessment: 0-10 Pain Score: 5  Pain Location: L knee Pain Descriptors / Indicators: Aching;Sore Pain Intervention(s): Limited activity within patient's tolerance;Monitored during session;Premedicated before session;Ice applied    Home Living Family/patient expects to be discharged to:: Private residence Living Arrangements: Spouse/significant other Available Help at Discharge: Family Type of Home: House Home Access: Stairs to enter Entrance Stairs-Rails: Right Home Layout: One level Home Equipment: None      Prior Function Level of Independence: Independent          PT Goals (current goals can now be found in the care plan section) Acute Rehab PT Goals Patient Stated Goal: Resume previous lifestyle with decreased pain PT Goal Formulation: With patient Time For Goal Achievement: 06/21/15 Potential to Achieve Goals: Good Progress towards PT goals: Progressing toward goals    Frequency  7X/week    PT Plan Current plan remains appropriate    Co-evaluation             End of Session Equipment Utilized During Treatment: Gait belt;Left knee immobilizer Activity Tolerance: Patient tolerated treatment well  Patient left: in chair;with call bell/phone within reach;with family/visitor present     Time: 0918-0950 PT Time Calculation (min) (ACUTE ONLY): 32 min  Charges:  $Gait Training: 8-22 mins $Therapeutic Exercise: 8-22 mins                    G Codes:      Leslie [name] 06/18/2015, 12:22 PM

## 2015-06-18 NOTE — Progress Notes (Signed)
Physical Therapy Treatment Patient Details Name: Leslie [name] MRN: [id number] DOB: 1951/04/16 Today's Date: 06/18/2015    History of Present Illness L TKR    PT Comments    Good progress with mobility this date.  Pt hopeful for dc tomorrow.  Follow Up Recommendations  Home health PT     Equipment Recommendations  Rolling walker with 5" wheels    Recommendations for Other Services OT consult     Precautions / Restrictions Precautions Precautions: Fall;Knee Required Braces or Orthoses: Knee Immobilizer - Left Knee Immobilizer - Left: Discontinue once straight leg raise with < 10 degree lag Restrictions Weight Bearing Restrictions: No LLE Weight Bearing: Weight bearing as tolerated    Mobility  Bed Mobility Overal bed mobility: Needs Assistance Bed Mobility: Sit to Supine       Sit to supine: Min assist   General bed mobility comments: cues for sequence and use of R LE to self assist - physical assist to manage L LE  Transfers Overall transfer level: Needs assistance Equipment used: Rolling walker (2 wheeled) Transfers: Sit to/from Stand Sit to Stand: Min assist         General transfer comment: cues for LE management and use of UEs to self assist  Ambulation/Gait Ambulation/Gait assistance: Min assist;Min guard Ambulation Distance (Feet): 120 Feet Assistive device: Rolling walker (2 wheeled) Gait Pattern/deviations: Step-to pattern;Decreased step length - right;Decreased step length - left;Shuffle;Trunk flexed Gait velocity: decr   General Gait Details: cues for sequence, posture and position from [employer]   Stairs            Wheelchair Mobility    Modified Rankin (Stroke Patients Only)       Balance                                    Cognition Arousal/Alertness: Awake/alert Behavior During Therapy: WFL for tasks assessed/performed Overall Cognitive Status: Within Functional Limits for tasks assessed                       Exercises      General Comments        Pertinent Vitals/Pain Pain Assessment: 0-10 Pain Score: 4  Pain Location: L knee Pain Descriptors / Indicators: Aching;Sore Pain Intervention(s): Limited activity within patient's tolerance;Monitored during session;Premedicated before session;Ice applied    Home Living                      Prior Function            PT Goals (current goals can now be found in the care plan section) Acute Rehab PT Goals Patient Stated Goal: Resume previous lifestyle with decreased pain PT Goal Formulation: With patient Time For Goal Achievement: 06/21/15 Potential to Achieve Goals: Good Progress towards PT goals: Progressing toward goals    Frequency  7X/week    PT Plan Current plan remains appropriate    Co-evaluation             End of Session Equipment Utilized During Treatment: Gait belt;Left knee immobilizer Activity Tolerance: Patient tolerated treatment well Patient left: in bed;with call bell/phone within reach;with family/visitor present     Time: [id number] PT Time Calculation (min) (ACUTE ONLY): 23 min  Charges:  $Gait Training: 23-37 mins                    G Codes:  Leslie [name] 06/18/2015, 4:15 PM

## 2015-06-18 NOTE — Progress Notes (Signed)
Subjective: 2 Days Post-Op Procedure(s) (LRB): LEFT TOTAL KNEE ARTHROPLASTY (Left) Patient reports pain as mild.    Objective: Vital signs in last 24 hours: Temp:  [98.3 F (36.8 C)-102.2 F (39 C)] 98.3 F (36.8 C) (11/27 0609) Pulse Rate:  [76-107] 107 (11/27 0502) Resp:  [16] 16 (11/27 0502) BP: (121-133)/(63-69) 121/65 mmHg (11/27 0502) SpO2:  [94 %-100 %] 94 % (11/27 0502)  Intake/Output from previous day: 11/26 0701 - 11/27 0700 In: 480 [P.O.:480] Out: 1475 [Urine:1475] Intake/Output this shift:     Recent Labs  06/17/15 0450 06/18/15 0459  HGB 8.7* 7.9*    Recent Labs  06/17/15 0450 06/18/15 0459  WBC 6.4 7.3  RBC 3.85* 3.49*  HCT 29.5* 26.6*  PLT 302 244    Recent Labs  06/17/15 0450  NA 135  K 4.0  CL 99*  CO2 31  BUN 10  CREATININE 1.07*  GLUCOSE 119*  CALCIUM 9.0    Recent Labs  06/17/15 0450 06/18/15 0459  INR 1.10 1.57*    Neurologically intact  Assessment/Plan: 2 Days Post-Op Procedure(s) (LRB): LEFT TOTAL KNEE ARTHROPLASTY (Left) Up with therapy  Corgan Mormile C 06/18/2015, 10:42 AM

## 2015-06-18 NOTE — Evaluation (Signed)
Occupational Therapy Evaluation Patient Details Name: Leslie HightKaylene W Bazzi MRN: [id number] DOB: October 13, 1950 Today's Date: 06/18/2015    History of Present Illness L TKR   Clinical Impression   Pt is s/p TKA resulting in the deficits listed below (see OT Problem List).  Pt will benefit from skilled OT to increase their safety and independence with ADL and functional mobility for ADL to facilitate discharge to venue listed below.        Follow Up Recommendations  Home health OT    Equipment Recommendations  None recommended by OT    Recommendations for Other Services       Precautions / Restrictions Precautions Precautions: Fall;Knee Required Braces or Orthoses: Knee Immobilizer - Left Knee Immobilizer - Left: Discontinue once straight leg raise with < 10 degree lag Restrictions Weight Bearing Restrictions: No LLE Weight Bearing: Weight bearing as tolerated Other Position/Activity Restrictions: WBAT      Mobility Bed Mobility               General bed mobility comments: pt in chair  Transfers Overall transfer level: Needs assistance Equipment used: Rolling walker (2 wheeled) Transfers: Sit to/from Stand           General transfer comment: pt sleepy- s/p pain meds         ADL Overall ADL's : Needs assistance/impaired                                       General ADL Comments: pt fatigued s/p PT and s/ p pain meds.  Pt needing mod A with LB ADL activity this day but will progress quickly.  OT educated pt and family regarding               Pertinent Vitals/Pain Pain Assessment: 0-10 Pain Score: 3  Pain Location:  l knee Pain Descriptors / Indicators: Sore Pain Intervention(s): Monitored during session;Premedicated before session     Hand Dominance     Extremity/Trunk Assessment Upper Extremity Assessment Upper Extremity Assessment: Generalized weakness           Communication Communication Communication: No  difficulties   Cognition Arousal/Alertness: Awake/alert Behavior During Therapy: WFL for tasks assessed/performed Overall Cognitive Status: Within Functional Limits for tasks assessed                                Home Living Family/patient expects to be discharged to:: Private residence Living Arrangements: Spouse/significant other Available Help at Discharge: Family Type of Home: House Home Access: Stairs to enter Secretary/administratorntrance Stairs-Number of Steps: 8 Entrance Stairs-Rails: Right Home Layout: One level     Bathroom Shower/Tub: Producer, television/film/videoWalk-in shower   Bathroom Toilet: Standard     Home Equipment: None          Prior Functioning/Environment Level of Independence: Independent             OT Diagnosis: Generalized weakness   OT Problem List: Decreased strength;Decreased activity tolerance   OT Treatment/Interventions: Self-care/ADL training;DME and/or AE instruction;Patient/family education    OT Goals(Current goals can be found in the care plan section) Acute Rehab OT Goals Patient Stated Goal: Resume previous lifestyle with decreased pain OT Goal Formulation: With patient Time For Goal Achievement: 06/25/15  OT Frequency: Min 2X/week   Barriers to D/C:  End of Session Equipment Utilized During Treatment: Engineer, water Communication: Mobility status  Activity Tolerance: Patient limited by fatigue Patient left: in chair   Time: 1010-1035 OT Time Calculation (min): 25 min Charges:  OT General Charges $OT Visit: 1 Procedure OT Evaluation $Initial OT Evaluation Tier I: 1 Procedure OT Treatments $Self Care/Home Management : 8-22 mins G-Codes:    Einar Crow D Jun 23, 2015, 11:17 AM

## 2015-06-18 NOTE — Progress Notes (Signed)
ANTICOAGULATION CONSULT NOTE - Follow up  Pharmacy Consult for Coumadin Indication: VTE prophylaxis  Allergies  Allergen Reactions  . Sulfamethoxazole Nausea And Vomiting    See patient list of med intolerances due to h/o gastric bypass and nephrectomy    Patient Measurements: Height: 5\' 5"  (165.1 cm) Weight: 204 lb (92.534 kg) IBW/kg (Calculated) : 57  Vital Signs: Temp: 98.3 F (36.8 C) (11/27 0609) Temp Source: Oral (11/27 0609) BP: 121/65 mmHg (11/27 0502) Pulse Rate: 107 (11/27 0502)  Labs:  Recent Labs  06/17/15 0450 06/18/15 0459  HGB 8.7* 7.9*  HCT 29.5* 26.6*  PLT 302 244  LABPROT 14.4 18.8*  INR 1.10 1.57*  CREATININE 1.07*  --     Estimated Creatinine Clearance: 59.7 mL/min (by C-G formula based on Cr of 1.07).  Assessment: 64yo F s/p L TKA on 06/16/15 -- Pharmacy to dose Coumadin for VTE ppx.  - INR subtherapeutic but rising nicely after 7.5mg  x 2.   - CBC is ok. No bleeding reported/documented. - Regular diet: small amounts charted. - Potential drug-interactions: none  Goal of Therapy:  INR 2-3 Monitor platelets by anticoagulation protocol: Yes   Plan:  - Repeat Coumadin 7.5 mg PO x1 today but will likely require 5mg  per day. - Daily INR - Monitor for s/s bleeding - Counseling completed 11/26.  Charolotte Ekeom Ayrianna Mcginniss, PharmD, pager [phone]. 06/18/2015,11:56 AM.

## 2015-06-18 NOTE — Progress Notes (Signed)
Physical Therapy Treatment - Late entry for 06/17/15 Patient Details Name: Leslie [name] MRN: [id number] DOB: 06-21-1951 Today's Date: 06/18/2015    History of Present Illness L TKR    PT Comments    Pt continues cooperative but moving slowly 2* pain/fatigue.  Follow Up Recommendations  Home health PT     Equipment Recommendations  Rolling walker with 5" wheels    Recommendations for Other Services OT consult     Precautions / Restrictions Precautions Precautions: Fall;Knee Required Braces or Orthoses: Knee Immobilizer - Left Knee Immobilizer - Left: Discontinue once straight leg raise with < 10 degree lag Restrictions Weight Bearing Restrictions: No Other Position/Activity Restrictions: WBAT    Mobility  Bed Mobility Overal bed mobility: Needs Assistance Bed Mobility: Sit to Supine       Sit to supine: Min assist;Mod assist   General bed mobility comments: cues for sequence and use of R LE to self assist  Transfers Overall transfer level: Needs assistance Equipment used: Rolling walker (2 wheeled) Transfers: Sit to/from Stand Sit to Stand: Min assist;Mod assist         General transfer comment: cues for LE management and use of UEs to self assist  Ambulation/Gait Ambulation/Gait assistance: Min assist;Mod assist Ambulation Distance (Feet): 24 Feet Assistive device: Rolling walker (2 wheeled) Gait Pattern/deviations: Step-to pattern;Decreased step length - right;Decreased step length - left;Shuffle;Trunk flexed;Antalgic Gait velocity: decr   General Gait Details: cues for sequence, posture and position from [employer]   Stairs            Wheelchair Mobility    Modified Rankin (Stroke Patients Only)       Balance                                    Cognition Arousal/Alertness: Awake/alert Behavior During Therapy: WFL for tasks assessed/performed Overall Cognitive Status: Within Functional Limits for tasks assessed                       Exercises      General Comments        Pertinent Vitals/Pain Pain Assessment: 0-10 Pain Score: 5  Pain Location: L knee/thigh Pain Descriptors / Indicators: Aching;Sore Pain Intervention(s): Limited activity within patient's tolerance;Monitored during session;Premedicated before session;Ice applied    Home Living                      Prior Function            PT Goals (current goals can now be found in the care plan section) Acute Rehab PT Goals Patient Stated Goal: Resume previous lifestyle with decreased pain PT Goal Formulation: With patient Time For Goal Achievement: 06/21/15 Potential to Achieve Goals: Good Progress towards PT goals: Progressing toward goals    Frequency  7X/week    PT Plan Current plan remains appropriate    Co-evaluation             End of Session Equipment Utilized During Treatment: Gait belt;Left knee immobilizer Activity Tolerance: Patient tolerated treatment well;Patient limited by pain Patient left: in bed;with call bell/phone within reach;with family/visitor present     Time: [id number] PT Time Calculation (min) (ACUTE ONLY): 26 min  Charges:  $Gait Training: 23-37 mins                    G Codes:  Reynaldo Rossman 06/18/2015, 7:18 AM

## 2015-06-19 LAB — CBC
HEMATOCRIT: 24.2 % — AB (ref 36.0–46.0)
HEMOGLOBIN: 7.5 g/dL — AB (ref 12.0–15.0)
MCH: 23.2 pg — ABNORMAL LOW (ref 26.0–34.0)
MCHC: 31 g/dL (ref 30.0–36.0)
MCV: 74.9 fL — AB (ref 78.0–100.0)
PLATELETS: 217 10*3/uL (ref 150–400)
RBC: 3.23 MIL/uL — AB (ref 3.87–5.11)
RDW: 15.9 % — ABNORMAL HIGH (ref 11.5–15.5)
WBC: 6.4 10*3/uL (ref 4.0–10.5)

## 2015-06-19 LAB — PREPARE RBC (CROSSMATCH)

## 2015-06-19 LAB — PROTIME-INR
INR: 1.79 — ABNORMAL HIGH (ref 0.00–1.49)
Prothrombin Time: 20.8 seconds — ABNORMAL HIGH (ref 11.6–15.2)

## 2015-06-19 MED ORDER — SODIUM CHLORIDE 0.9 % IV SOLN
Freq: Once | INTRAVENOUS | Status: AC
  Administered 2015-06-19: 09:00:00 via INTRAVENOUS

## 2015-06-19 MED ORDER — RIVAROXABAN 10 MG PO TABS
10.0000 mg | ORAL_TABLET | Freq: Every day | ORAL | Status: DC
  Administered 2015-06-19: 10 mg via ORAL
  Filled 2015-06-19: qty 1

## 2015-06-19 MED ORDER — RIVAROXABAN 10 MG PO TABS
10.0000 mg | ORAL_TABLET | Freq: Every day | ORAL | Status: DC
Start: 2015-06-19 — End: 2016-07-26

## 2015-06-19 NOTE — Discharge Summary (Signed)
Patient ID: Leslie [name] MRN: [id number] DOB/AGE: [birth year] 64 y.o.  Admit date: 06/16/2015 Discharge date: 06/19/2015  Admission Diagnoses:  Principal Problem:   Osteoarthritis of left knee Active Problems:   Status post total left knee replacement   Discharge Diagnoses:  Same  Past Medical History  Diagnosis Date  . Allergy   . Hypertension   . Obesity     post gastric bypass  . Colon polyps   . ADD (attention deficit disorder)   . Left knee pain     chronic  . Narcotic addiction (HCC)   . Vitamin D deficiency   . Arthritis 2016    osteoarthritis left knee    Surgeries: Procedure(s): LEFT TOTAL KNEE ARTHROPLASTY on 06/16/2015   Consultants:    Discharged Condition: Improved  Hospital Course: Leslie [name] is an 64 y.o. female who was admitted 06/16/2015 for operative treatment ofOsteoarthritis of left knee. Patient has severe unremitting pain that affects sleep, daily activities, and work/hobbies. After pre-op clearance the patient was taken to the operating room on 06/16/2015 and underwent  Procedure(s): LEFT TOTAL KNEE ARTHROPLASTY.    Patient was given perioperative antibiotics: Anti-infectives    Start     Dose/Rate Route Frequency Ordered Stop   06/16/15 1400  ceFAZolin (ANCEF) IVPB 1 g/50 mL premix     1 g 100 mL/hr over 30 Minutes Intravenous Every 6 hours 06/16/15 1143 06/16/15 2048   06/16/15 0544  ceFAZolin (ANCEF) IVPB 2 g/50 mL premix     2 g 100 mL/hr over 30 Minutes Intravenous On call to O.R. 06/16/15 0544 06/16/15 0741       Patient was given sequential compression devices, early ambulation, and chemoprophylaxis to prevent DVT.  Patient benefited maximally from hospital stay and there were no complications.  She did receive 1 unit of blood due to acute blood loss anemia that was symptomatic.  Recent vital signs: Patient Vitals for the past 24 hrs:  BP Temp Temp src Pulse Resp SpO2  06/19/15 0556 117/66 mmHg 98.8 F (37.1 C)  Oral 88 16 95 %  06/18/15 2106 116/62 mmHg 99.4 F (37.4 C) Oral 95 16 (!) 85 %  06/18/15 1357 (!) 112/55 mmHg 98.4 F (36.9 C) Oral 87 16 97 %     Recent laboratory studies:  Recent Labs  06/17/15 0450 06/18/15 0459 06/19/15 0445  WBC 6.4 7.3 6.4  HGB 8.7* 7.9* 7.5*  HCT 29.5* 26.6* 24.2*  PLT 302 244 217  NA 135  --   --   K 4.0  --   --   CL 99*  --   --   CO2 31  --   --   BUN 10  --   --   CREATININE 1.07*  --   --   GLUCOSE 119*  --   --   INR 1.10 1.57* 1.79*  CALCIUM 9.0  --   --      Discharge Medications:     Medication List    TAKE these medications        amphetamine-dextroamphetamine 30 MG 24 hr capsule  Commonly known as:  ADDERALL XR  Take 1 capsule (30 mg total) by mouth every morning.     amphetamine-dextroamphetamine 20 MG tablet  Commonly known as:  ADDERALL  take 1/2 to 1 tablet by mouth every evening if needed     FLUoxetine 40 MG capsule  Commonly known as:  PROZAC  take 1 capsule by mouth once daily  gabapentin 300 MG capsule  Commonly known as:  NEURONTIN  Take 300 mg by mouth daily as needed (shingles pain).     hydrochlorothiazide 25 MG tablet  Commonly known as:  HYDRODIURIL  take 1 tablet by mouth once daily     hydroxypropyl methylcellulose / hypromellose 2.5 % ophthalmic solution  Commonly known as:  ISOPTO TEARS / GONIOVISC  Place 1 drop into both eyes 3 (three) times daily as needed for dry eyes.     lamoTRIgine 100 MG tablet  Commonly known as:  LAMICTAL  Take 200 mg by mouth daily.     methocarbamol 500 MG tablet  Commonly known as:  ROBAXIN  Take 1 tablet (500 mg total) by mouth every 6 (six) hours as needed for muscle spasms.     oxyCODONE-acetaminophen 5-325 MG tablet  Commonly known as:  ROXICET  Take 1-2 tablets by mouth every 4 (four) hours as needed for severe pain.     rivaroxaban 10 MG Tabs tablet  Commonly known as:  XARELTO  Take 1 tablet (10 mg total) by mouth daily.     SM CALCIUM 500/VITAMIN  D3 500-400 MG-UNIT tablet  Generic drug:  calcium-vitamin D  Take 1 tablet by mouth daily.     triamcinolone 55 MCG/ACT nasal inhaler  Commonly known as:  NASACORT  Place 2 sprays into the nose daily.     vitamin C 1000 MG tablet  Take 1,000 mg by mouth daily.     zolpidem 10 MG tablet  Commonly known as:  AMBIEN  take 1 tablet by mouth once daily at bedtime for sleep        Diagnostic Studies: Dg Knee Left Port  06/16/2015  CLINICAL DATA:  Total knee replacement EXAM: PORTABLE LEFT KNEE - 1-2 VIEW COMPARISON:  None. FINDINGS: Total knee replacement in satisfactory position alignment. No fracture or acute abnormality. Soft tissue swelling with gas in the joint. IMPRESSION: Satisfactory knee replacement. Electronically Signed   By: Marlan Palau M.D.   On: 06/16/2015 10:41    Disposition: to home      Discharge Instructions    Discharge patient    Complete by:  As directed            Follow-up Information    Follow up with [facility].   Why:  home health physical therapy   Contact information:   [address] [place] [address] [phone]       Follow up with Kathryne Hitch, MD In 2 weeks.   Specialty:  Orthopedic Surgery   Contact information:   [address] [place] [address] [phone]       Schedule an appointment as soon as possible for a visit to follow up.       SignedKathryne Hitch 06/19/2015, 7:29 AM

## 2015-06-19 NOTE — Progress Notes (Signed)
Subjective: 3 Days Post-Op Procedure(s) (LRB): LEFT TOTAL KNEE ARTHROPLASTY (Left) Patient reports pain as moderate.  Acute blood loss anemia from surgery with stable vitals and minimal light-headedness.  Objective: Vital signs in last 24 hours: Temp:  [98.4 F (36.9 C)-99.4 F (37.4 C)] 98.8 F (37.1 C) (11/28 0556) Pulse Rate:  [87-95] 88 (11/28 0556) Resp:  [16] 16 (11/28 0556) BP: (112-117)/(55-66) 117/66 mmHg (11/28 0556) SpO2:  [85 %-97 %] 95 % (11/28 0556)  Intake/Output from previous day: 11/27 0701 - 11/28 0700 In: 720 [P.O.:720] Out: 2150 [Urine:2150] Intake/Output this shift:     Recent Labs  06/17/15 0450 06/18/15 0459 06/19/15 0445  HGB 8.7* 7.9* 7.5*    Recent Labs  06/18/15 0459 06/19/15 0445  WBC 7.3 6.4  RBC 3.49* 3.23*  HCT 26.6* 24.2*  PLT 244 217    Recent Labs  06/17/15 0450  NA 135  K 4.0  CL 99*  CO2 31  BUN 10  CREATININE 1.07*  GLUCOSE 119*  CALCIUM 9.0    Recent Labs  06/18/15 0459 06/19/15 0445  INR 1.57* 1.79*    Sensation intact distally Intact pulses distally Dorsiflexion/Plantar flexion intact Incision: no drainage No cellulitis present Compartment soft  Assessment/Plan: 3 Days Post-Op Procedure(s) (LRB): LEFT TOTAL KNEE ARTHROPLASTY (Left) Discharge home with home health today this afternoon after one unit of PRBC  BLACKMAN,CHRISTOPHER Y 06/19/2015, 7:25 AM

## 2015-06-19 NOTE — Progress Notes (Signed)
Physical Therapy Treatment Patient Details Name: Leslie [name] MRN: [id number] DOB: 28-Dec-1950 Today's Date: 06/19/2015    History of Present Illness L TKR    PT Comments    Pt progressing well; has supportive family; instructed in mobility safety, stairs, donning/doffing KI; will benefit from HHPT  Follow Up Recommendations  Home health PT     Equipment Recommendations  Rolling walker with 5" wheels    Recommendations for Other Services       Precautions / Restrictions Precautions Precautions: Fall;Knee Required Braces or Orthoses: Knee Immobilizer - Left Knee Immobilizer - Left: Discontinue once straight leg raise with < 10 degree lag Restrictions LLE Weight Bearing: Weight bearing as tolerated    Mobility  Bed Mobility Overal bed mobility: Needs Assistance Bed Mobility: Supine to Sit     Supine to sit: Min assist     General bed mobility comments: cues for sequence and use of R LE to self assist - physical assist to manage L LE  Transfers Overall transfer level: Needs assistance Equipment used: Rolling walker (2 wheeled) Transfers: Sit to/from Stand Sit to Stand: Min guard         General transfer comment: cues for hand placement and safety, family instructed as well  Ambulation/Gait Ambulation/Gait assistance: Min guard Ambulation Distance (Feet): 54 Feet Assistive device: Rolling walker (2 wheeled) Gait Pattern/deviations: Step-to pattern;Antalgic;Trunk flexed;Decreased step length - right;Decreased step length - left Gait velocity: decr   General Gait Details: cues for sequence, posture and position from RW   Stairs Stairs: Yes Stairs assistance: Min assist Stair Management: One rail Right;One rail Left;Sideways;Step to pattern Number of Stairs: 3 (x2) General stair comments: cues for sequence, safety, correct hand placement; family present and able to return demo  Wheelchair Mobility    Modified Rankin (Stroke Patients Only)        Balance                                    Cognition Arousal/Alertness: Awake/alert Behavior During Therapy: WFL for tasks assessed/performed Overall Cognitive Status: Within Functional Limits for tasks assessed                      Exercises      General Comments        Pertinent Vitals/Pain Pain Assessment: 0-10 Pain Score: 4  Pain Location: L knee Pain Descriptors / Indicators: Sore Pain Intervention(s): Limited activity within patient's tolerance;Monitored during session;Premedicated before session    Home Living                      Prior Function            PT Goals (current goals can now be found in the care plan section) Acute Rehab PT Goals Patient Stated Goal: Resume previous lifestyle with decreased pain PT Goal Formulation: With patient Time For Goal Achievement: 06/21/15 Potential to Achieve Goals: Good Progress towards PT goals: Progressing toward goals    Frequency  7X/week    PT Plan Current plan remains appropriate    Co-evaluation             End of Session Equipment Utilized During Treatment: Gait belt;Left knee immobilizer Activity Tolerance: Patient tolerated treatment well Patient left: in chair;with call bell/phone within reach;with family/visitor present     Time: 2725-3664 PT Time Calculation (min) (ACUTE ONLY): 27 min  Charges:  $  Gait Training: 23-37 mins                    G Codes:      Lelar Farewell 06/19/2015, 3:47 PM

## 2015-06-19 NOTE — Discharge Instructions (Signed)
INSTRUCTIONS AFTER JOINT REPLACEMENT  ° °o Remove items at home which could result in a fall. This includes throw rugs or furniture in walking pathways °o ICE to the affected joint every three hours while awake for 30 minutes at a time, for at least the first 3-5 days, and then as needed for pain and swelling.  Continue to use ice for pain and swelling. You may notice swelling that will progress down to the foot and ankle.  This is normal after surgery.  Elevate your leg when you are not up walking on it.   °o Continue to use the breathing machine you got in the hospital (incentive spirometer) which will help keep your temperature down.  It is common for your temperature to cycle up and down following surgery, especially at night when you are not up moving around and exerting yourself.  The breathing machine keeps your lungs expanded and your temperature down. ° ° °DIET:  As you were doing prior to hospitalization, we recommend a well-balanced diet. ° °DRESSING / WOUND CARE / SHOWERING ° °Keep the surgical dressing until follow up.  The dressing is water proof, so you can shower without any extra covering.  IF THE DRESSING FALLS OFF or the wound gets wet inside, change the dressing with sterile gauze.  Please use good hand washing techniques before changing the dressing.  Do not use any lotions or creams on the incision until instructed by your surgeon.   ° °ACTIVITY ° °o Increase activity slowly as tolerated, but follow the weight bearing instructions below.   °o No driving for 6 weeks or until further direction given by your physician.  You cannot drive while taking narcotics.  °o No lifting or carrying greater than 10 lbs. until further directed by your surgeon. °o Avoid periods of inactivity such as sitting longer than an hour when not asleep. This helps prevent blood clots.  °o You may return to work once you are authorized by your doctor.  ° ° ° °WEIGHT BEARING  ° °Weight bearing as tolerated with assist  device (walker, cane, etc) as directed, use it as long as suggested by your surgeon or therapist, typically at least 4-6 weeks. ° ° °EXERCISES ° °Results after joint replacement surgery are often greatly improved when you follow the exercise, range of motion and muscle strengthening exercises prescribed by your doctor. Safety measures are also important to protect the joint from further injury. Any time any of these exercises cause you to have increased pain or swelling, decrease what you are doing until you are comfortable again and then slowly increase them. If you have problems or questions, call your caregiver or physical therapist for advice.  ° °Rehabilitation is important following a joint replacement. After just a few days of immobilization, the muscles of the leg can become weakened and shrink (atrophy).  These exercises are designed to build up the tone and strength of the thigh and leg muscles and to improve motion. Often times heat used for twenty to thirty minutes before working out will loosen up your tissues and help with improving the range of motion but do not use heat for the first two weeks following surgery (sometimes heat can increase post-operative swelling).  ° °These exercises can be done on a training (exercise) mat, on the floor, on a table or on a bed. Use whatever works the best and is most comfortable for you.    Use music or television while you are exercising so that   the exercises are a pleasant break in your day. This will make your life better with the exercises acting as a break in your routine that you can look forward to.   Perform all exercises about fifteen times, three times per day or as directed.  You should exercise both the operative leg and the other leg as well. ° °Exercises include: °  °• Quad Sets - Tighten up the muscle on the front of the thigh (Quad) and hold for 5-10 seconds.   °• Straight Leg Raises - With your knee straight (if you were given a brace, keep it on),  lift the leg to 60 degrees, hold for 3 seconds, and slowly lower the leg.  Perform this exercise against resistance later as your leg gets stronger.  °• Leg Slides: Lying on your back, slowly slide your foot toward your buttocks, bending your knee up off the floor (only go as far as is comfortable). Then slowly slide your foot back down until your leg is flat on the floor again.  °• Angel Wings: Lying on your back spread your legs to the side as far apart as you can without causing discomfort.  °• Hamstring Strength:  Lying on your back, push your heel against the floor with your leg straight by tightening up the muscles of your buttocks.  Repeat, but this time bend your knee to a comfortable angle, and push your heel against the floor.  You may put a pillow under the heel to make it more comfortable if necessary.  ° °A rehabilitation program following joint replacement surgery can speed recovery and prevent re-injury in the future due to weakened muscles. Contact your doctor or a physical therapist for more information on knee rehabilitation.  ° ° °CONSTIPATION ° °Constipation is defined medically as fewer than three stools per week and severe constipation as less than one stool per week.  Even if you have a regular bowel pattern at home, your normal regimen is likely to be disrupted due to multiple reasons following surgery.  Combination of anesthesia, postoperative narcotics, change in appetite and fluid intake all can affect your bowels.  ° °YOU MUST use at least one of the following options; they are listed in order of increasing strength to get the job done.  They are all available over the counter, and you may need to use some, POSSIBLY even all of these options:   ° °Drink plenty of fluids (prune juice may be helpful) and high fiber foods °Colace 100 mg by mouth twice a day  °Senokot for constipation as directed and as needed Dulcolax (bisacodyl), take with full glass of water  °Miralax (polyethylene glycol)  once or twice a day as needed. ° °If you have tried all these things and are unable to have a bowel movement in the first 3-4 days after surgery call either your surgeon or your primary doctor.   ° °If you experience loose stools or diarrhea, hold the medications until you stool forms back up.  If your symptoms do not get better within 1 week or if they get worse, check with your doctor.  If you experience "the worst abdominal pain ever" or develop nausea or vomiting, please contact the office immediately for further recommendations for treatment. ° ° °ITCHING:  If you experience itching with your medications, try taking only a single pain pill, or even half a pain pill at a time.  You can also use Benadryl over the counter for itching or also to   help with sleep.   TED HOSE STOCKINGS:  Use stockings on both legs until for at least 2 weeks or as directed by physician office. They may be removed at night for sleeping.  MEDICATIONS:  See your medication summary on the After Visit Summary that nursing will review with you.  You may have some home medications which will be placed on hold until you complete the course of blood thinner medication.  It is important for you to complete the blood thinner medication as prescribed.  PRECAUTIONS:  If you experience chest pain or shortness of breath - call 911 immediately for transfer to the hospital emergency department.   If you develop a fever greater that 101 F, purulent drainage from wound, increased redness or drainage from wound, foul odor from the wound/dressing, or calf pain - CONTACT YOUR SURGEON.                                                   FOLLOW-UP APPOINTMENTS:  If you do not already have a post-op appointment, please call the office for an appointment to be seen by your surgeon.  Guidelines for how soon to be seen are listed in your After Visit Summary, but are typically between 1-4 weeks after surgery.  OTHER INSTRUCTIONS:   Knee  Replacement:  Do not place pillow under knee, focus on keeping the knee straight while resting. CPM instructions: 0-90 degrees, 2 hours in the morning, 2 hours in the afternoon, and 2 hours in the evening. Place foam block, curve side up under heel at all times except when in CPM or when walking.  DO NOT modify, tear, cut, or change the foam block in any way.  MAKE SURE YOU:   Understand these instructions.   Get help right away if you are not doing well or get worse.    Thank you for letting us be a part of your medical care team.  It is a privilege we respect greatly.  We hope these instructions will help you stay on track for a fast and full recovery!      Information on my medicine - XARELTO (Rivaroxaban)  This medication education was reviewed with me or my healthcare representative as part of my discharge preparation.  The pharmacist that spoke with me during my hospital stay was:  Brendolyn Stockley, [facility]  Why was Xarelto prescribed for you? Xarelto was prescribed for you to reduce the risk of blood clots forming after orthopedic surgery. The medical term for these abnormal blood clots is venous thromboembolism (VTE).  What do you need to know about xarelto ? Take your Xarelto ONCE DAILY at the same time every day. You may take it either with or without food.  If you have difficulty swallowing the tablet whole, you may crush it and mix in applesauce just prior to taking your dose.  Take Xarelto exactly as prescribed by your doctor and DO NOT stop taking Xarelto without talking to the doctor who prescribed the medication.  Stopping without other VTE prevention medication to take the place of Xarelto may increase your risk of developing a clot.  After discharge, you should have regular check-up appointments with your healthcare provider that is prescribing your Xarelto.    What do you do if you miss a dose? If you miss a dose, take it as soon as  you remember on the same day  then continue your regularly scheduled once daily regimen the next day. Do not take two doses of Xarelto on the same day.   Important Safety Information A possible side effect of Xarelto is bleeding. You should call your healthcare provider right away if you experience any of the following: ? Bleeding from an injury or your nose that does not stop. ? Unusual colored urine (red or dark brown) or unusual colored stools (red or black). ? Unusual bruising for unknown reasons. ? A serious fall or if you hit your head (even if there is no bleeding).  Some medicines may interact with Xarelto and might increase your risk of bleeding while on Xarelto. To help avoid this, consult your healthcare provider or pharmacist prior to using any new prescription or non-prescription medications, including herbals, vitamins, non-steroidal anti-inflammatory drugs (NSAIDs) and supplements.  This website has more information on Xarelto: [email or web address].

## 2015-06-19 NOTE — Progress Notes (Signed)
Occupational Therapy Treatment Patient Details Name: Leslie [name] MRN: [id number] DOB: 10-09-1950 Today's Date: 06/19/2015    History of present illness L TKR   OT comments  Nausea limiting pt at this time  Follow Up Recommendations  Home health OT    Equipment Recommendations  None recommended by OT    Recommendations for Other Services      Precautions / Restrictions Precautions Precautions: Fall;Knee Required Braces or Orthoses: Knee Immobilizer - Left Knee Immobilizer - Left: Discontinue once straight leg raise with < 10 degree lag Restrictions Weight Bearing Restrictions: No LLE Weight Bearing: Weight bearing as tolerated       Mobility Bed Mobility Overal bed mobility: Needs Assistance Bed Mobility: Rolling;Supine to Sit Rolling: Min assist   Supine to sit: Min assist Sit to supine: Min assist      Transfers                 General transfer comment: did not perform    Balance                                   ADL Overall ADL's : Needs assistance/impaired                                       General ADL Comments: pt refused OOB at this time but did work on reopsitioning in the bed in order to eat.. OT educated pt and daugther on ADL techniques including bathing, dressing and walker safety.  Pt and daugther verbalize understanding, but pt declined practicing at this time.        Vision                     Perception     Praxis      Cognition   Behavior During Therapy: WFL for tasks assessed/performed Overall Cognitive Status: Within Functional Limits for tasks assessed                       Extremity/Trunk Assessment               Exercises     Shoulder Instructions       General Comments      Pertinent Vitals/ Pain       Pain Score: 3  Pain Location: L knee Pain Intervention(s): Monitored during session  Home Living                                           Prior Functioning/Environment              Frequency Min 2X/week     Progress Toward Goals  OT Goals(current goals can now be found in the care plan section)        Plan      Co-evaluation                 End of Session Equipment Utilized During Treatment: Rolling walker CPM Left Knee CPM Left Knee: Off   Activity Tolerance Patient limited by fatigue   Patient Left in chair   Nurse Communication Mobility status        Time: 0953-1001 OT Time Calculation (min): 8 min  Charges: OT General Charges $OT Visit: 1 Procedure OT Treatments $Self Care/Home Management : 8-22 mins  Nussen Pullin, Metro KungLorraine D 06/19/2015, 10:54 AM

## 2015-06-19 NOTE — Progress Notes (Signed)
Patient alert and oriented, pain controlled. Patient given discharge instructions and prescriptions. All questions answered. Home health setup and patient has DME.

## 2015-06-20 LAB — TYPE AND SCREEN
ABO/RH(D): A POS
Antibody Screen: NEGATIVE
Unit division: 0

## 2015-07-03 ENCOUNTER — Other Ambulatory Visit: Payer: Self-pay | Admitting: Internal Medicine

## 2015-07-07 ENCOUNTER — Other Ambulatory Visit: Payer: Self-pay | Admitting: Internal Medicine

## 2015-07-07 NOTE — Telephone Encounter (Signed)
Pt last visit 05/09/15 Pt last refill 04/10/15 #90

## 2015-07-07 NOTE — Telephone Encounter (Signed)
Pt needs new rx fluoxentine 40 mg #90 w/refills send to [facility]

## 2015-07-10 ENCOUNTER — Ambulatory Visit: Payer: Medicare Other | Attending: Orthopaedic Surgery

## 2015-07-10 DIAGNOSIS — M25662 Stiffness of left knee, not elsewhere classified: Secondary | ICD-10-CM | POA: Insufficient documentation

## 2015-07-10 DIAGNOSIS — R600 Localized edema: Secondary | ICD-10-CM | POA: Diagnosis present

## 2015-07-10 DIAGNOSIS — M25562 Pain in left knee: Secondary | ICD-10-CM | POA: Diagnosis not present

## 2015-07-10 DIAGNOSIS — R269 Unspecified abnormalities of gait and mobility: Secondary | ICD-10-CM | POA: Diagnosis present

## 2015-07-10 MED ORDER — FLUOXETINE HCL 40 MG PO CAPS: 40.0000 mg | ORAL_CAPSULE | Freq: Every day | ORAL | Status: DC

## 2015-07-10 NOTE — Telephone Encounter (Signed)
Rx sent to pharmacy   

## 2015-07-10 NOTE — Therapy (Signed)
[facility] [address] [place], [place], [address] Phone: [phone]   Fax:  [phone]  Physical Therapy Evaluation  Patient Details  Name: Leslie [name] MRN: [id number] Date of Birth: 27-May-1951 Referring Provider: Doneen Poisson, MD  Encounter Date: 07/10/2015      PT End of Session - 07/10/15 0916    Visit Number 1   Number of Visits 10   Date for PT Re-Evaluation 09/04/15   PT Start Time 0847   PT Stop Time 0940   PT Time Calculation (min) 53 min   Activity Tolerance Patient tolerated treatment well   Behavior During Therapy [facility] for tasks assessed/performed      Past Medical History  Diagnosis Date  . Allergy   . Hypertension   . Obesity     post gastric bypass  . Colon polyps   . ADD (attention deficit disorder)   . Left knee pain     chronic  . Narcotic addiction (HCC)   . Vitamin D deficiency   . Arthritis 2016    osteoarthritis left knee    Past Surgical History  Procedure Laterality Date  . Gastric bypass  2003  . Cholecystectomy    . Kidney donation  2004  . Breast surgery  years ago    breast biopsy  . Arthscopic knee surgery Left     several times  . Eye surgery Bilateral 2013    Toric Lens implants  . Eye surgery Right 2014    corneal amniotic membrane  . Total knee arthroplasty Left 06/16/2015    Procedure: LEFT TOTAL KNEE ARTHROPLASTY;  Surgeon: Kathryne Hitch, MD;  Location: [facility];  Service: Orthopedics;  Laterality: Left;    There were no vitals filed for this visit.  Visit Diagnosis:  Knee pain, acute, left - Plan: PT plan of care cert/re-cert  Leg edema, left - Plan: PT plan of care cert/re-cert  Abnormality of gait - Plan: PT plan of care cert/re-cert  Stiffness of knee joint, left - Plan: PT plan of care cert/re-cert      Subjective Assessment - 07/10/15 0853    Subjective Pt presents to PT s/p Lt TKA 06/16/15 for OA.  Pt has home  heath PT until 07/07/15.  Pt is using a walker for all distances for safety.   Limitations Standing;Walking   How long can you sit comfortably? get tired   How long can you stand comfortably? 15-20 minutes   Patient Stated Goals improve gait, reduce pain, improve strength and AROM   Currently in Pain? Yes   Pain Score 3    Pain Location Knee   Pain Orientation Left   Pain Descriptors / Indicators Aching   Pain Type Surgical pain   Pain Onset 1 to 4 weeks ago   Pain Frequency Constant   Aggravating Factors  standing, walking, sitting, bending the knee   Pain Relieving Factors pain meds, ice   Multiple Pain Sites No            [facility] PT Assessment - 07/10/15 0001    Assessment   Medical Diagnosis s/p Lt TKA   Referring Provider Doneen Poisson, MD   Onset Date/Surgical Date 06/16/15   Next MD Visit 07/26/15   Prior Therapy home health PT until 07/06/15   Precautions   Precautions None   Restrictions   Weight Bearing Restrictions No   Balance Screen   Has the patient fallen in the past 6 months No  Has the patient had a decrease in activity level because of a fear of falling?  No   Is the patient reluctant to leave their home because of a fear of falling?  No   Home Environment   Living Environment Private residence   Living Arrangements Spouse/significant other   Type of Home House   Home Access Stairs to enter   Entrance Stairs-Number of Steps 8   Home Layout One level   Prior Function   Level of Independence Independent   Vocation Retired   Observation/Other Assessments   Focus on Therapeutic Outcomes (FOTO)  68% limitation   ROM / Strength   AROM / PROM / Strength AROM;PROM;Strength   AROM   Overall AROM  Deficits   Overall AROM Comments Rt knee extension 2, flexion 110   AROM Assessment Site Knee   Right/Left Knee Left   Left Knee Extension 3   Left Knee Flexion 90   PROM   Overall PROM  Deficits   PROM Assessment Site Knee   Right/Left Knee Left    Left Knee Extension 2   Left Knee Flexion 93   Strength   Overall Strength Deficits   Overall Strength Comments Rt hip 4/5 SLR with lag   Strength Assessment Site Knee   Right/Left Knee Left   Left Knee Flexion 4+/5   Left Knee Extension 4/5   Palpation   Patella mobility reduced mobility on the Lt in all directions   Palpation comment Well healing surgical incision with reduced mobilty over the superior 1/3 of incision.  Edema noted over bilateral knee joint with warmth   Transfers   Transfers Sit to Stand   Sit to Stand 6: Modified independent (Device/Increase time);With upper extremity assist   Ambulation/Gait   Ambulation/Gait Yes   Ambulation/Gait Assistance 6: Modified independent (Device/Increase time)   Ambulation Distance (Feet) 100 Feet   Assistive device Rolling walker   Gait Pattern Step-through pattern   Ambulation Surface Level   Stairs Yes   Stair Management Technique One rail Right;Step to pattern   Number of Stairs 4   Height of Stairs 6                   [facility] Adult PT Treatment/Exercise - 07/10/15 0001    Exercises   Exercises Knee/Hip   Knee/Hip Exercises: Aerobic   Nustep Level 2 x 8 minutes  seat 8, arms 10   Modalities   Modalities Vasopneumatic   Vasopneumatic   Number Minutes Vasopneumatic  15 minutes   Vasopnuematic Location  Knee   Vasopneumatic Pressure Medium   Vasopneumatic Temperature  3 snowflakes                  PT Short Term Goals - 07/10/15 [id number]    PT SHORT TERM GOAL #1   Title be independent in initial HEP   Time 4   Period Weeks   Status New   PT SHORT TERM GOAL #2   Title report < or = to 2/10 Lt knee pain with standing and walking   Time 4   Period Weeks   Status New   PT SHORT TERM GOAL #3   Title wean from cane for all household distances   Time 4   Period Weeks   Status New   PT SHORT TERM GOAL #4   Title improve Lt LE strength to ascend steps with step-over-step gait and use of one rail    Time 4  Period Weeks   Status New   PT SHORT TERM GOAL #5   Title demonstrate Lt knee AROM flexion to > or = to 100 degrees to improve steps and squatting   Time 4   Period Weeks   Status New           PT Long Term Goals - 2015-07-21 0845    PT LONG TERM GOAL #1   Title be independent in advanced HEP   Time 8   Period Weeks   Status New   PT LONG TERM GOAL #2   Title reduce FOTO to < or = to 52% limitation   Time 8   Period Weeks   Status New   PT LONG TERM GOAL #3   Title stand and walk for 30-45 minutes to improve endurance for home and community tasks   Time 8   Period Weeks   Status New   PT LONG TERM GOAL #4   Title demonstrate Lt knee flexion to > or = to 110 degrees to squat without limitation   Time 8   Period Weeks   Status New   PT LONG TERM GOAL #5   Title descend steps with step-over-step gait at least 75% of the time   Time 8   Period Weeks   Status New   Additional Long Term Goals   Additional Long Term Goals Yes   PT LONG TERM GOAL #6   Title wean from walker for all community distances and demonstrate symmetry    Time 8   Period Weeks   Status New               Plan - July 21, 2015 1610    Clinical Impression Statement Pt presents to PT 3 weeks s/p Lt TKA due to OA.  Pt had home health PT until 07/06/15.  Pt is using a walker for all distances, demonstrates weakness and stiffness in the Lt knee with edema present.  Pt is limited to walking 15-20 minutes and FOTO score is 68% limitation.  Pt will benefit from skilled PT for AROM, strength, gait and edema management to allow for return to prior level of function.     Pt will benefit from skilled therapeutic intervention in order to improve on the following deficits Abnormal gait;Decreased range of motion;Difficulty walking;Increased edema;Decreased strength;Decreased scar mobility;Impaired flexibility;Pain;Decreased activity tolerance;Decreased endurance   Rehab Potential Good   PT Frequency 2x /  week   PT Duration 8 weeks   PT Treatment/Interventions ADLs/Self Care Home Management;Cryotherapy;Electrical Stimulation;Moist Heat;Therapeutic exercise;Therapeutic activities;Functional mobility training;Stair training;Gait training;Ultrasound;Neuromuscular re-education;Patient/family education;Manual techniques;Vasopneumatic Device;Passive range of motion;Scar mobilization   PT Next Visit Plan Gait training, Lt knee AROM and strength, manual, edema management   Consulted and Agree with Plan of Care Patient          G-Codes - 21-Jul-2015 0900    Functional Assessment Tool Used FOTO: 68% limitation   Functional Limitation Mobility: Walking and moving around   Mobility: Walking and Moving Around Current Status (R6045) At least 60 percent but less than 80 percent impaired, limited or restricted   Mobility: Walking and Moving Around Goal Status [phone]) At least 20 percent but less than 40 percent impaired, limited or restricted       Problem List Patient Active Problem List   Diagnosis Date Noted  . Osteoarthritis of left knee 06/16/2015  . Status post total left knee replacement 06/16/2015  . MYALGIA 12/19/2009  . SUBSTANCE ABUSE 02/06/2009  . KNEE PAIN,  LEFT 10/06/2008  . URI 07/11/2008  . ACUTE MAXILLARY SINUSITIS 03/17/2008  . ADD 09/14/2007  . COLONIC POLYPS, HX OF 08/03/2007  . Essential hypertension 01/20/2007  . OBESITY NOS 12/04/2006  . Allergic rhinitis 12/04/2006  . GASTROJEJUNOSTOMY, HX OF 12/04/2006  . BREAST BIOPSY, HX OF 12/04/2006    TAKACS,KELLY, PT 07/10/2015, 9:30 AM  [facility] [address], [address] [place], [place], [address] Phone: [phone]   Fax:  [phone]  Name: Leslie [name] MRN: [id number] Date of Birth: [birth year]

## 2015-07-12 ENCOUNTER — Ambulatory Visit: Payer: Medicare Other | Admitting: Physical Therapy

## 2015-07-12 ENCOUNTER — Encounter: Payer: Self-pay | Admitting: Physical Therapy

## 2015-07-12 DIAGNOSIS — M25662 Stiffness of left knee, not elsewhere classified: Secondary | ICD-10-CM

## 2015-07-12 DIAGNOSIS — R600 Localized edema: Secondary | ICD-10-CM

## 2015-07-12 DIAGNOSIS — M25562 Pain in left knee: Secondary | ICD-10-CM

## 2015-07-12 DIAGNOSIS — R269 Unspecified abnormalities of gait and mobility: Secondary | ICD-10-CM

## 2015-07-12 NOTE — Therapy (Signed)
[facility] [address], [address] [place], [place], [address] Phone: [phone]   Fax:  [phone]  Physical Therapy Treatment  Patient Details  Name: Leslie [name] MRN: [id number] Date of Birth: 03-22-1951 Referring Provider: Doneen PoissonBlackman, Christopher, MD  Encounter Date: 07/12/2015      PT End of Session - 07/12/15 1030    Visit Number 2   Number of Visits 10   Date for PT Re-Evaluation 09/04/15   PT Start Time 1026  Pt late 10 min   PT Stop Time 1115   PT Time Calculation (min) 49 min   Activity Tolerance Patient tolerated treatment well   [facility] tasks assessed/performed      Past Medical History  Diagnosis Date  . Allergy   . Hypertension   . Obesity     post gastric bypass  . Colon polyps   . ADD (attention deficit disorder)   . Left knee pain     chronic  . Narcotic addiction (HCC)   . Vitamin D deficiency   . Arthritis 2016    osteoarthritis left knee    Past Surgical History  Procedure Laterality Date  . Gastric bypass  2003  . Cholecystectomy    . Kidney donation  2004  . Breast surgery  years ago    breast biopsy  . Arthscopic knee surgery Left     several times  . Eye surgery Bilateral 2013    Toric Lens implants  . Eye surgery Right 2014    corneal amniotic membrane  . Total knee arthroplasty Left 06/16/2015    Procedure: LEFT TOTAL KNEE ARTHROPLASTY;  Surgeon: Kathryne Hitchhristopher Y Blackman, MD;  Location: [facility];  Service: Orthopedics;  Laterality: Left;    There were no vitals filed for this visit.  Visit Diagnosis:  Knee pain, acute, left  Leg edema, left  Abnormality of gait  Stiffness of knee joint, left      Subjective Assessment - 07/12/15 1029    Subjective Walking the cane today. KNee is feeling pretty good this AM.     Currently in Pain? Yes   Pain Score 2    Pain Location Knee   Pain Orientation Left   Pain Descriptors / Indicators Dull   Aggravating Factors  Bending   Pain Relieving Factors Meds, ice   Multiple Pain Sites No                         [facility] Adult PT Treatment/Exercise - 07/12/15 0001    Ambulation/Gait   Ambulation Distance (Feet) 120 Feet   Assistive device Straight cane   Ambulation Surface Level;Indoor   Knee/Hip Exercises: Aerobic   Stationary Bike L0 x 7 min Full fwrd revoution    Knee/Hip Exercises: Standing   Forward Step Up Left;1 set;10 reps;Hand Hold: 2  On step, pt fearful, VC on technique   Rocker Board 3 minutes   Rebounder 3 way weight shifting 1 min   Knee/Hip Exercises: Seated   Long Arc Quad Strengthening;Left;2 sets;10 reps  Added ball squeeze   Vasopneumatic   Number Minutes Vasopneumatic  15 minutes   Vasopnuematic Location  Knee   Vasopneumatic Pressure Medium   Vasopneumatic Temperature  3 snowflakes                  PT Short Term Goals - 07/10/15 [id number]    PT SHORT TERM GOAL #1   Title be independent  in initial HEP   Time 4   Period Weeks   Status New   PT SHORT TERM GOAL #2   Title report < or = to 2/10 Lt knee pain with standing and walking   Time 4   Period Weeks   Status New   PT SHORT TERM GOAL #3   Title wean from cane for all household distances   Time 4   Period Weeks   Status New   PT SHORT TERM GOAL #4   Title improve Lt LE strength to ascend steps with step-over-step gait and use of one rail   Time 4   Period Weeks   Status New   PT SHORT TERM GOAL #5   Title demonstrate Lt knee AROM flexion to > or = to 100 degrees to improve steps and squatting   Time 4   Period Weeks   Status New           PT Long Term Goals - 07/10/15 0845    PT LONG TERM GOAL #1   Title be independent in advanced HEP   Time 8   Period Weeks   Status New   PT LONG TERM GOAL #2   Title reduce FOTO to < or = to 52% limitation   Time 8   Period Weeks   Status New   PT LONG TERM GOAL #3   Title stand and walk for 30-45 minutes to improve  endurance for home and community tasks   Time 8   Period Weeks   Status New   PT LONG TERM GOAL #4   Title demonstrate Lt knee flexion to > or = to 110 degrees to squat without limitation   Time 8   Period Weeks   Status New   PT LONG TERM GOAL #5   Title descend steps with step-over-step gait at least 75% of the time   Time 8   Period Weeks   Status New   Additional Long Term Goals   Additional Long Term Goals Yes   PT LONG TERM GOAL #6   Title wean from walker for all community distances and demonstrate symmetry    Time 8   Period Weeks   Status New               Plan - 07/12/15 1050    Clinical Impression Statement Only second PT visit today and she is already walking with her cane. She plans to gradually increase her weight bearing time emphasising GRADUAL. Pt was late so treatment time was limited today.    Pt will benefit from skilled therapeutic intervention in order to improve on the following deficits Abnormal gait;Decreased range of motion;Difficulty walking;Increased edema;Decreased strength;Decreased scar mobility;Impaired flexibility;Pain;Decreased activity tolerance;Decreased endurance   Rehab Potential Good   PT Frequency 2x / week   PT Duration 8 weeks   PT Treatment/Interventions ADLs/Self Care Home Management;Cryotherapy;Electrical Stimulation;Moist Heat;Therapeutic exercise;Therapeutic activities;Functional mobility training;Stair training;Gait training;Ultrasound;Neuromuscular re-education;Patient/family education;Manual techniques;Vasopneumatic Device;Passive range of motion;Scar mobilization   PT Next Visit Plan Gait training, Lt knee AROM and strength, manual, edema management   Consulted and Agree with Plan of Care Patient        Problem List Patient Active Problem List   Diagnosis Date Noted  . Osteoarthritis of left knee 06/16/2015  . Status post total left knee replacement 06/16/2015  . MYALGIA 12/19/2009  . SUBSTANCE ABUSE 02/06/2009  .  KNEE PAIN, LEFT 10/06/2008  . URI 07/11/2008  . ACUTE MAXILLARY SINUSITIS  03/17/2008  . ADD 09/14/2007  . COLONIC POLYPS, HX OF 08/03/2007  . Essential hypertension 01/20/2007  . OBESITY NOS 12/04/2006  . Allergic rhinitis 12/04/2006  . GASTROJEJUNOSTOMY, HX OF 12/04/2006  . BREAST BIOPSY, HX OF 12/04/2006    Nandini Bogdanski, PTA 07/12/2015, 10:59 AM  [facility] [address], [address] [place], [place], [address] Phone: [phone]   Fax:  [phone]  Name: Leslie [name] MRN: [id number] Date of Birth: 04/27/1951

## 2015-07-19 ENCOUNTER — Telehealth: Payer: Self-pay | Admitting: Internal Medicine

## 2015-07-19 ENCOUNTER — Encounter: Payer: BC Managed Care – PPO | Admitting: Physical Therapy

## 2015-07-19 MED ORDER — GABAPENTIN 300 MG PO CAPS: 300.0000 mg | ORAL_CAPSULE | Freq: Every day | ORAL | Status: DC

## 2015-07-19 NOTE — Telephone Encounter (Signed)
Spoke to pt, asked her if she is taking one capsule daily? Pt said yes. Told her okay Rx sent to pharmacy. Pt verbalized understanding.

## 2015-07-19 NOTE — Telephone Encounter (Signed)
Pt called saying she's out of Gabapentin. The Rx has never been sent to her [facility] pharmacy from this practice so they can't refill it. She's wondering if we can send a Rx to the [facility] for her. If you have questions or concerns, please call the pt.  Pt's ph# [phone] [facility] Fax# [phone] Thank you.

## 2015-07-20 ENCOUNTER — Ambulatory Visit: Payer: Medicare Other

## 2015-07-20 DIAGNOSIS — M25562 Pain in left knee: Secondary | ICD-10-CM | POA: Diagnosis not present

## 2015-07-20 DIAGNOSIS — M25662 Stiffness of left knee, not elsewhere classified: Secondary | ICD-10-CM

## 2015-07-20 DIAGNOSIS — R269 Unspecified abnormalities of gait and mobility: Secondary | ICD-10-CM

## 2015-07-20 DIAGNOSIS — R600 Localized edema: Secondary | ICD-10-CM

## 2015-07-20 NOTE — Therapy (Signed)
[facility] [address], [address] [place], [place], [address] Phone: [phone]   Fax:  [phone]  Physical Therapy Treatment  Patient Details  Name: Leslie [name] MRN: [id number] Date of Birth: [birth year] Referring Provider: Doneen Poisson, MD  Encounter Date: 07/20/2015      PT End of Session - 07/20/15 1610    Visit Number 3   Number of Visits 10   Date for PT Re-Evaluation 09/04/15   PT Start Time 1530   PT Stop Time 1625   PT Time Calculation (min) 55 min   Activity Tolerance Patient tolerated treatment well   Behavior During Therapy [facility] for tasks assessed/performed      Past Medical History  Diagnosis Date  . Allergy   . Hypertension   . Obesity     post gastric bypass  . Colon polyps   . ADD (attention deficit disorder)   . Left knee pain     chronic  . Narcotic addiction (HCC)   . Vitamin D deficiency   . Arthritis 2016    osteoarthritis left knee    Past Surgical History  Procedure Laterality Date  . Gastric bypass  2003  . Cholecystectomy    . Kidney donation  2004  . Breast surgery  years ago    breast biopsy  . Arthscopic knee surgery Left     several times  . Eye surgery Bilateral 2013    Toric Lens implants  . Eye surgery Right 2014    corneal amniotic membrane  . Total knee arthroplasty Left 06/16/2015    Procedure: LEFT TOTAL KNEE ARTHROPLASTY;  Surgeon: Kathryne Hitch, MD;  Location: [facility];  Service: Orthopedics;  Laterality: Left;    There were no vitals filed for this visit.  Visit Diagnosis:  Knee pain, acute, left  Leg edema, left  Abnormality of gait  Stiffness of knee joint, left      Subjective Assessment - 07/20/15 1535    Subjective Pt is walking with cane.  Knee is feeling good.  Was very active over Christmas.     Currently in Pain? Yes   Pain Score 2    Pain Location Knee   Pain Orientation Left   Pain Descriptors / Indicators  Tightness;Dull   Pain Type Surgical pain   Pain Onset 1 to 4 weeks ago   Pain Frequency Constant   Aggravating Factors  walking, bending the knee   Pain Relieving Factors meds, ice            [facility] PT Assessment - 07/20/15 0001    PROM   Overall PROM  Deficits   PROM Assessment Site Knee   Right/Left Knee Left   Left Knee Extension 0   Left Knee Flexion 100                     [facility] Adult PT Treatment/Exercise - 07/20/15 0001    Knee/Hip Exercises: Aerobic   Nustep Level 2 x 8 minutes  seat 8, arms 10   Knee/Hip Exercises: Standing   Lateral Step Up Left;2 sets;10 reps;Hand Hold: 1   Forward Step Up Left;10 reps;Hand Hold: 2;2 sets  On step, pt fearful, VC on technique   Rocker Board 3 minutes   Rebounder 3 way weight shifting 1 min   Knee/Hip Exercises: Seated   Long Arc Quad Strengthening;Left;2 sets;10 reps  Added ball squeeze   Modalities   Modalities Vasopneumatic   Vasopneumatic  Number Minutes Vasopneumatic  15 minutes   Vasopnuematic Location  Knee   Vasopneumatic Pressure High   Vasopneumatic Temperature  3 snowflakes   Manual Therapy   Manual Therapy Soft tissue mobilization   Manual therapy comments soft tissue to Lt distal hamstring insertion and distal quads, scar mobs                  PT Short Term Goals - 07/20/15 1534    PT SHORT TERM GOAL #1   Title be independent in initial HEP   Status Achieved   PT SHORT TERM GOAL #2   Title report < or = to 2/10 Lt knee pain with standing and walking   Time 4   Period Weeks   Status On-going   PT SHORT TERM GOAL #3   Title wean from cane for all household distances   Time 4   Period Weeks   Status On-going           PT Long Term Goals - 07/10/15 0845    PT LONG TERM GOAL #1   Title be independent in advanced HEP   Time 8   Period Weeks   Status New   PT LONG TERM GOAL #2   Title reduce FOTO to < or = to 52% limitation   Time 8   Period Weeks   Status New   PT  LONG TERM GOAL #3   Title stand and walk for 30-45 minutes to improve endurance for home and community tasks   Time 8   Period Weeks   Status New   PT LONG TERM GOAL #4   Title demonstrate Lt knee flexion to > or = to 110 degrees to squat without limitation   Time 8   Period Weeks   Status New   PT LONG TERM GOAL #5   Title descend steps with step-over-step gait at least 75% of the time   Time 8   Period Weeks   Status New   Additional Long Term Goals   Additional Long Term Goals Yes   PT LONG TERM GOAL #6   Title wean from walker for all community distances and demonstrate symmetry    Time 8   Period Weeks   Status New               Plan - 07/20/15 1541    Clinical Impression Statement Pt is begining to wean from the cane in the house ~25% of the time.  Pt with minimal Lt knee pain and rates pain 2/10 on average.  Pt is independent in HEP.  Pt with continued Lt knee edema, limited AROM and gait abnormality s/p TKA.  Pt will benefit from skilled PT for edema management, AROM and strength progression.   Pt will benefit from skilled therapeutic intervention in order to improve on the following deficits Abnormal gait;Decreased range of motion;Difficulty walking;Increased edema;Decreased strength;Decreased scar mobility;Impaired flexibility;Pain;Decreased activity tolerance;Decreased endurance   Rehab Potential Good   PT Frequency 2x / week   PT Duration 8 weeks   PT Treatment/Interventions ADLs/Self Care Home Management;Cryotherapy;Electrical Stimulation;Moist Heat;Therapeutic exercise;Therapeutic activities;Functional mobility training;Stair training;Gait training;Ultrasound;Neuromuscular re-education;Patient/family education;Manual techniques;Vasopneumatic Device;Passive range of motion;Scar mobilization   PT Next Visit Plan Gait training, Lt knee AROM and strength, manual, edema management        Problem List Patient Active Problem List   Diagnosis Date Noted  .  Osteoarthritis of left knee 06/16/2015  . Status post total left knee replacement 06/16/2015  .  MYALGIA 12/19/2009  . SUBSTANCE ABUSE 02/06/2009  . KNEE PAIN, LEFT 10/06/2008  . URI 07/11/2008  . ACUTE MAXILLARY SINUSITIS 03/17/2008  . ADD 09/14/2007  . COLONIC POLYPS, HX OF 08/03/2007  . Essential hypertension 01/20/2007  . OBESITY NOS 12/04/2006  . Allergic rhinitis 12/04/2006  . GASTROJEJUNOSTOMY, HX OF 12/04/2006  . BREAST BIOPSY, HX OF 12/04/2006    Chantelle Verdi, PT 07/20/2015, 4:11 PM  [facility] [address], [address] [place], [place], [address] Phone: [phone]   Fax:  [phone]  Name: Leslie [name] MRN: [id number] Date of Birth: Nov 18, 1950

## 2015-07-25 ENCOUNTER — Ambulatory Visit: Payer: Medicare Other

## 2015-07-27 ENCOUNTER — Encounter: Payer: BC Managed Care – PPO | Admitting: Physical Therapy

## 2015-07-31 ENCOUNTER — Encounter: Payer: BC Managed Care – PPO | Admitting: Physical Therapy

## 2015-08-02 ENCOUNTER — Ambulatory Visit: Payer: Medicare Other | Attending: Orthopaedic Surgery | Admitting: Physical Therapy

## 2015-08-02 ENCOUNTER — Encounter: Payer: Self-pay | Admitting: Physical Therapy

## 2015-08-02 DIAGNOSIS — R600 Localized edema: Secondary | ICD-10-CM | POA: Diagnosis present

## 2015-08-02 DIAGNOSIS — M25562 Pain in left knee: Secondary | ICD-10-CM

## 2015-08-02 DIAGNOSIS — M25662 Stiffness of left knee, not elsewhere classified: Secondary | ICD-10-CM | POA: Insufficient documentation

## 2015-08-02 DIAGNOSIS — R269 Unspecified abnormalities of gait and mobility: Secondary | ICD-10-CM

## 2015-08-02 NOTE — Therapy (Signed)
[facility] [address], [address] [place], [place], [address] Phone: [phone]   Fax:  [phone]  Physical Therapy Treatment  Patient Details  Name: Leslie [name] MRN: [id number] Date of Birth: 1950-10-18 Referring Provider: Doneen Poisson, MD  Encounter Date: 08/02/2015      PT End of Session - 08/02/15 0936    Visit Number 4   Number of Visits 10   Date for PT Re-Evaluation 09/04/15   PT Start Time 0932   PT Stop Time 1030   PT Time Calculation (min) 58 min   Activity Tolerance Patient tolerated treatment well   Behavior During Therapy [facility] for tasks assessed/performed      Past Medical History  Diagnosis Date  . Allergy   . Hypertension   . Obesity     post gastric bypass  . Colon polyps   . ADD (attention deficit disorder)   . Left knee pain     chronic  . Narcotic addiction (HCC)   . Vitamin D deficiency   . Arthritis 2016    osteoarthritis left knee    Past Surgical History  Procedure Laterality Date  . Gastric bypass  2003  . Cholecystectomy    . Kidney donation  2004  . Breast surgery  years ago    breast biopsy  . Arthscopic knee surgery Left     several times  . Eye surgery Bilateral 2013    Toric Lens implants  . Eye surgery Right 2014    corneal amniotic membrane  . Total knee arthroplasty Left 06/16/2015    Procedure: LEFT TOTAL KNEE ARTHROPLASTY;  Surgeon: Kathryne Hitch, MD;  Location: [facility];  Service: Orthopedics;  Laterality: Left;    There were no vitals filed for this visit.  Visit Diagnosis:  Knee pain, acute, left  Leg edema, left  Abnormality of gait  Stiffness of knee joint, left      Subjective Assessment - 08/02/15 0936    Subjective Weaning off cane, has felt pretty good even through cold weather.   Currently in Pain? No/denies   Multiple Pain Sites No                         [facility] Adult PT Treatment/Exercise - 08/02/15  0001    Knee/Hip Exercises: Aerobic   Nustep L3 x 10 min   Knee/Hip Exercises: Machines for Strengthening   Cybex Leg Press ST# 7 bil 55#, Seat to #6 55# 2x 10  This weight was easy, can progress next visit.    Knee/Hip Exercises: Standing   Lateral Step Up Left;2 sets;15 reps;Hand Hold: 1;Step Height: 6"   Forward Step Up Left;2 sets;15 reps;Hand Hold: 1;Step Height: 6"   Rocker Board 3 minutes  VC on posture and holding the heel downs for 10 sec   Rebounder 3 way weight shifting 1 min   Knee/Hip Exercises: Seated   Long Arc Quad Strengthening;Left;2 sets;15 reps  Added ball squeeze & 2# wt   Vasopneumatic   Number Minutes Vasopneumatic  15 minutes   Vasopnuematic Location  Knee   Vasopneumatic Pressure High   Vasopneumatic Temperature  3 snowflakes                PT Education - 08/02/15 0944    Education provided Yes   Education Details How to perform knee flexion stretching using the CREEp theory at home   Person(s) Educated Patient   Methods Explanation;Demonstration  Comprehension Verbalized understanding;Returned demonstration          PT Short Term Goals - 08/02/15 0938    PT SHORT TERM GOAL #2   Title report < or = to 2/10 Lt knee pain with standing and walking   Time 4   Period Weeks   Status Achieved   PT SHORT TERM GOAL #3   Title wean from cane for all household distances   Time 4   Period Weeks   Status On-going  90%           PT Long Term Goals - 07/10/15 0845    PT LONG TERM GOAL #1   Title be independent in advanced HEP   Time 8   Period Weeks   Status New   PT LONG TERM GOAL #2   Title reduce FOTO to < or = to 52% limitation   Time 8   Period Weeks   Status New   PT LONG TERM GOAL #3   Title stand and walk for 30-45 minutes to improve endurance for home and community tasks   Time 8   Period Weeks   Status New   PT LONG TERM GOAL #4   Title demonstrate Lt knee flexion to > or = to 110 degrees to squat without limitation    Time 8   Period Weeks   Status New   PT LONG TERM GOAL #5   Title descend steps with step-over-step gait at least 75% of the time   Time 8   Period Weeks   Status New   Additional Long Term Goals   Additional Long Term Goals Yes   PT LONG TERM GOAL #6   Title wean from walker for all community distances and demonstrate symmetry    Time 8   Period Weeks   Status New               Plan - 08/02/15 1005    Clinical Impression Statement Only using cane for outdoor ambulation when she feels extra caution is needed, progressing towards this goal. We were able to increase weights and reps this week including the leg press.     Pt will benefit from skilled therapeutic intervention in order to improve on the following deficits Abnormal gait;Decreased range of motion;Difficulty walking;Increased edema;Decreased strength;Decreased scar mobility;Impaired flexibility;Pain;Decreased activity tolerance;Decreased endurance   Rehab Potential Good   PT Frequency 2x / week   PT Duration 8 weeks   PT Treatment/Interventions ADLs/Self Care Home Management;Cryotherapy;Electrical Stimulation;Moist Heat;Therapeutic exercise;Therapeutic activities;Functional mobility training;Stair training;Gait training;Ultrasound;Neuromuscular re-education;Patient/family education;Manual techniques;Vasopneumatic Device;Passive range of motion;Scar mobilization   PT Next Visit Plan Continue to progress strength, measure flexion   Consulted and Agree with Plan of Care Patient        Problem List Patient Active Problem List   Diagnosis Date Noted  . Osteoarthritis of left knee 06/16/2015  . Status post total left knee replacement 06/16/2015  . MYALGIA 12/19/2009  . SUBSTANCE ABUSE 02/06/2009  . KNEE PAIN, LEFT 10/06/2008  . URI 07/11/2008  . ACUTE MAXILLARY SINUSITIS 03/17/2008  . ADD 09/14/2007  . COLONIC POLYPS, HX OF 08/03/2007  . Essential hypertension 01/20/2007  . OBESITY NOS 12/04/2006  . Allergic  rhinitis 12/04/2006  . GASTROJEJUNOSTOMY, HX OF 12/04/2006  . BREAST BIOPSY, HX OF 12/04/2006    Azhar Knope, PTA 08/02/2015, 10:12 AM  [facility] [address] [place], [place], [address] Phone: [phone]   Fax:  [phone]  Name:  Caro HightKaylene W Sadiq MRN: [id number] Date of Birth: 06-Apr-1951

## 2015-08-03 ENCOUNTER — Telehealth: Payer: Self-pay | Admitting: Internal Medicine

## 2015-08-03 MED ORDER — ZOLPIDEM TARTRATE 10 MG PO TABS: ORAL_TABLET | ORAL | Status: DC

## 2015-08-03 NOTE — Telephone Encounter (Signed)
Pt needs refill on generic ambien 10 mg. [facility]

## 2015-08-03 NOTE — Telephone Encounter (Signed)
Pt notified Rx called into pharmacy 

## 2015-08-04 ENCOUNTER — Ambulatory Visit: Payer: Medicare Other | Admitting: Physical Therapy

## 2015-08-04 ENCOUNTER — Encounter: Payer: Self-pay | Admitting: Physical Therapy

## 2015-08-04 DIAGNOSIS — R269 Unspecified abnormalities of gait and mobility: Secondary | ICD-10-CM

## 2015-08-04 DIAGNOSIS — R600 Localized edema: Secondary | ICD-10-CM

## 2015-08-04 DIAGNOSIS — M25562 Pain in left knee: Secondary | ICD-10-CM | POA: Diagnosis not present

## 2015-08-04 DIAGNOSIS — M25662 Stiffness of left knee, not elsewhere classified: Secondary | ICD-10-CM

## 2015-08-04 NOTE — Therapy (Signed)
[facility] [address], [address] [place], [place], [address] Phone: [phone]   Fax:  [phone]  Physical Therapy Treatment  Patient Details  Name: Leslie [name] MRN: [id number] Date of Birth: 1951/03/31 Referring Provider: Doneen Poisson, MD  Encounter Date: 08/04/2015      PT End of Session - 08/04/15 1019    Visit Number 5   Number of Visits 10   Date for PT Re-Evaluation 09/04/15   PT Start Time 1016   PT Stop Time 1117   PT Time Calculation (min) 61 min   Activity Tolerance Patient tolerated treatment well   Behavior During Therapy [facility] for tasks assessed/performed      Past Medical History  Diagnosis Date  . Allergy   . Hypertension   . Obesity     post gastric bypass  . Colon polyps   . ADD (attention deficit disorder)   . Left knee pain     chronic  . Narcotic addiction (HCC)   . Vitamin D deficiency   . Arthritis 2016    osteoarthritis left knee    Past Surgical History  Procedure Laterality Date  . Gastric bypass  2003  . Cholecystectomy    . Kidney donation  2004  . Breast surgery  years ago    breast biopsy  . Arthscopic knee surgery Left     several times  . Eye surgery Bilateral 2013    Toric Lens implants  . Eye surgery Right 2014    corneal amniotic membrane  . Total knee arthroplasty Left 06/16/2015    Procedure: LEFT TOTAL KNEE ARTHROPLASTY;  Surgeon: Kathryne Hitch, MD;  Location: [facility];  Service: Orthopedics;  Laterality: Left;    There were no vitals filed for this visit.  Visit Diagnosis:  Knee pain, acute, left  Leg edema, left  Abnormality of gait  Stiffness of knee joint, left      Subjective Assessment - 08/04/15 1018    Subjective Not using the cane, feeling pretty good with knee   Limitations Standing;Walking   How long can you sit comfortably? get tired   How long can you stand comfortably? 15-20 minutes   Patient Stated Goals improve  gait, reduce pain, improve strength and AROM   Currently in Pain? No/denies            [facility] PT Assessment - 08/04/15 0001    PROM   Overall PROM  Deficits   PROM Assessment Site Knee   Right/Left Knee Left   Left Knee Extension 0   Left Knee Flexion 102                     [facility] Adult PT Treatment/Exercise - 08/04/15 0001    Exercises   Exercises Knee/Hip   Knee/Hip Exercises: Aerobic   Nustep L3 x 10 min  seat #9, arms #10, 0.16mph   Knee/Hip Exercises: Machines for Strengthening   Cybex Leg Press 70# B LE 3 x 10, 35# 3x 10  Seat #6   Knee/Hip Exercises: Standing   Lateral Step Up Left;3 sets;10 reps;Hand Hold: 1;Step Height: 6"   Forward Step Up Left;3 sets;10 reps;Hand Hold: 2;Step Height: 6"   Rocker Board 3 minutes   Modalities   Modalities Vasopneumatic   Vasopneumatic   Number Minutes Vasopneumatic  15 minutes   Vasopnuematic Location  Knee   Vasopneumatic Pressure High   Vasopneumatic Temperature  3 snowflakes  PT Short Term Goals - 08/02/15 0938    PT SHORT TERM GOAL #2   Title report < or = to 2/10 Lt knee pain with standing and walking   Time 4   Period Weeks   Status Achieved   PT SHORT TERM GOAL #3   Title wean from cane for all household distances   Time 4   Period Weeks   Status On-going  90%           PT Long Term Goals - 07/10/15 0845    PT LONG TERM GOAL #1   Title be independent in advanced HEP   Time 8   Period Weeks   Status New   PT LONG TERM GOAL #2   Title reduce FOTO to < or = to 52% limitation   Time 8   Period Weeks   Status New   PT LONG TERM GOAL #3   Title stand and walk for 30-45 minutes to improve endurance for home and community tasks   Time 8   Period Weeks   Status New   PT LONG TERM GOAL #4   Title demonstrate Lt knee flexion to > or = to 110 degrees to squat without limitation   Time 8   Period Weeks   Status New   PT LONG TERM GOAL #5   Title descend steps with  step-over-step gait at least 75% of the time   Time 8   Period Weeks   Status New   Additional Long Term Goals   Additional Long Term Goals Yes   PT LONG TERM GOAL #6   Title wean from walker for all community distances and demonstrate symmetry    Time 8   Period Weeks   Status New               Plan - 08/04/15 1022    Clinical Impression Statement Pt with good performance of exercises in PT clinic. Pt will continue to improve with ROM, strength, proprioception and endurance to regain level of prior function.   Pt will benefit from skilled therapeutic intervention in order to improve on the following deficits Abnormal gait;Decreased range of motion;Difficulty walking;Increased edema;Decreased strength;Decreased scar mobility;Impaired flexibility;Pain;Decreased activity tolerance;Decreased endurance   Rehab Potential Good   PT Frequency 2x / week   PT Duration 8 weeks   PT Treatment/Interventions ADLs/Self Care Home Management;Cryotherapy;Electrical Stimulation;Moist Heat;Therapeutic exercise;Therapeutic activities;Functional mobility training;Stair training;Gait training;Ultrasound;Neuromuscular re-education;Patient/family education;Manual techniques;Vasopneumatic Device;Passive range of motion;Scar mobilization   PT Next Visit Plan Continue to progress strength, measure flexion   Consulted and Agree with Plan of Care Patient        Problem List Patient Active Problem List   Diagnosis Date Noted  . Osteoarthritis of left knee 06/16/2015  . Status post total left knee replacement 06/16/2015  . MYALGIA 12/19/2009  . SUBSTANCE ABUSE 02/06/2009  . KNEE PAIN, LEFT 10/06/2008  . URI 07/11/2008  . ACUTE MAXILLARY SINUSITIS 03/17/2008  . ADD 09/14/2007  . COLONIC POLYPS, HX OF 08/03/2007  . Essential hypertension 01/20/2007  . OBESITY NOS 12/04/2006  . Allergic rhinitis 12/04/2006  . GASTROJEJUNOSTOMY, HX OF 12/04/2006  . BREAST BIOPSY, HX OF 12/04/2006     NAUMANN-HOUEGNIFIO,Wynema Garoutte PTA 08/04/2015, 11:00 AM  [facility] [address] [place], [place], [address] Phone: [phone]   Fax:  [phone]  Name: Caro HightKaylene W Nole MRN: [id number] Date of Birth: 03-Jan-1951

## 2015-08-07 ENCOUNTER — Ambulatory Visit: Payer: Medicare Other

## 2015-08-07 DIAGNOSIS — R269 Unspecified abnormalities of gait and mobility: Secondary | ICD-10-CM

## 2015-08-07 DIAGNOSIS — R600 Localized edema: Secondary | ICD-10-CM

## 2015-08-07 DIAGNOSIS — M25662 Stiffness of left knee, not elsewhere classified: Secondary | ICD-10-CM

## 2015-08-07 DIAGNOSIS — M25562 Pain in left knee: Secondary | ICD-10-CM

## 2015-08-07 NOTE — Therapy (Signed)
[facility] [address], [address] [place], [place], [address] Phone: [phone]   Fax:  [phone]  Physical Therapy Treatment  Patient Details  Name: Leslie HightKaylene W [name] MRN: [id number] Date of Birth: 04-11-1951 Referring Provider: Doneen PoissonBlackman, Christopher, MD  Encounter Date: 08/07/2015      PT End of Session - 08/07/15 1144    Visit Number 6   Number of Visits 10   Date for PT Re-Evaluation 09/04/15   PT Start Time 1103   PT Stop Time 1200   PT Time Calculation (min) 57 min   Activity Tolerance Patient tolerated treatment well   Behavior [facility] for tasks assessed/performed      Past Medical History  Diagnosis Date  . Allergy   . Hypertension   . Obesity     post gastric bypass  . Colon polyps   . ADD (attention deficit disorder)   . Left knee pain     chronic  . Narcotic addiction (HCC)   . Vitamin D deficiency   . Arthritis 2016    osteoarthritis left knee    Past Surgical History  Procedure Laterality Date  . Gastric bypass  2003  . Cholecystectomy    . Kidney donation  2004  . Breast surgery  years ago    breast biopsy  . Arthscopic knee surgery Left     several times  . Eye surgery Bilateral 2013    Toric Lens implants  . Eye surgery Right 2014    corneal amniotic membrane  . Total knee arthroplasty Left 06/16/2015    Procedure: LEFT TOTAL KNEE ARTHROPLASTY;  Surgeon: Kathryne Hitchhristopher Y Blackman, MD;  Location: [facility];  Service: Orthopedics;  Laterality: Left;    There were no vitals filed for this visit.  Visit Diagnosis:  Knee pain, acute, left  Leg edema, left  Abnormality of gait  Stiffness of knee joint, left      Subjective Assessment - 08/07/15 1109    Subjective using bike at home now [employer]  Increased the bend in the knee.     Currently in Pain? No/denies                         [facility] Adult PT Treatment/Exercise - 08/07/15 0001    Knee/Hip Exercises: Aerobic   Stationary Bike Level 2 x 10 minutes   Knee/Hip Exercises: Machines for Strengthening   Cybex Leg Press 70# Bil LE 2 x 10, 75# bil. , 35# 3x 10  Seat #6   Knee/Hip Exercises: Standing   Lateral Step Up Left;3 sets;10 reps;Hand Hold: 1;Step Height: 8"   Forward Step Up Left;3 sets;10 reps;Hand Hold: 2;Step Height: 8"   Step Down 2 sets;10 reps;Left   Modalities   Modalities Vasopneumatic   Vasopneumatic   Number Minutes Vasopneumatic  15 minutes   Vasopnuematic Location  Knee   Vasopneumatic Pressure High   Vasopneumatic Temperature  3 snowflakes   Manual Therapy   Manual Therapy Soft tissue mobilization   Manual therapy comments soft tissue to Lt distal hamstring insertion and distal quads, scar mobs                  PT Short Term Goals - 08/07/15 1108    PT SHORT TERM GOAL #1   Title be independent in initial HEP   Status Achieved   PT SHORT TERM GOAL #2   Status Achieved   PT SHORT TERM GOAL #3  Title wean from cane for all household distances   Status Achieved   PT SHORT TERM GOAL #4   Title improve Lt LE strength to ascend steps with step-over-step gait and use of one rail   Status Achieved   PT SHORT TERM GOAL #5   Title demonstrate Lt knee AROM flexion to > or = to 100 degrees to improve steps and squatting   Status Achieved           PT Long Term Goals - 07/10/15 0845    PT LONG TERM GOAL #1   Title be independent in advanced HEP   Time 8   Period Weeks   Status New   PT LONG TERM GOAL #2   Title reduce FOTO to < or = to 52% limitation   Time 8   Period Weeks   Status New   PT LONG TERM GOAL #3   Title stand and walk for 30-45 minutes to improve endurance for home and community tasks   Time 8   Period Weeks   Status New   PT LONG TERM GOAL #4   Title demonstrate Lt knee flexion to > or = to 110 degrees to squat without limitation   Time 8   Period Weeks   Status New   PT LONG TERM GOAL #5   Title descend  steps with step-over-step gait at least 75% of the time   Time 8   Period Weeks   Status New   Additional Long Term Goals   Additional Long Term Goals Yes   PT LONG TERM GOAL #6   Title wean from walker for all community distances and demonstrate symmetry    Time 8   Period Weeks   Status New               Plan - 08/07/15 1110    Clinical Impression Statement Pt has weaned from the cane for household distances.  Pt is ascending steps with step-over-step gait pattern and continues to descned with step-to gait. Pt increased to 8" step-up with forward and lateral step motions today and began working on controlled descent with 6" step down.    Pt with continued Lt knee reduced AROM, functional strength and endurance.  Gait is normalizing on level surface.  Pt with continued post-op edema.  Pt will continue to benefit from skilled PT for strength, endurance, ROM and edema management.     Pt will benefit from skilled therapeutic intervention in order to improve on the following deficits Abnormal gait;Decreased range of motion;Difficulty walking;Increased edema;Decreased strength;Decreased scar mobility;Impaired flexibility;Pain;Decreased activity tolerance;Decreased endurance   Rehab Potential Good   PT Frequency 2x / week   PT Duration 8 weeks   PT Treatment/Interventions ADLs/Self Care Home Management;Cryotherapy;Electrical Stimulation;Moist Heat;Therapeutic exercise;Therapeutic activities;Functional mobility training;Stair training;Gait training;Ultrasound;Neuromuscular re-education;Patient/family education;Manual techniques;Vasopneumatic Device;Passive range of motion;Scar mobilization   PT Next Visit Plan Strength, ROM, edema management   Consulted and Agree with Plan of Care Patient        Problem List Patient Active Problem List   Diagnosis Date Noted  . Osteoarthritis of left knee 06/16/2015  . Status post total left knee replacement 06/16/2015  . MYALGIA 12/19/2009  .  SUBSTANCE ABUSE 02/06/2009  . KNEE PAIN, LEFT 10/06/2008  . URI 07/11/2008  . ACUTE MAXILLARY SINUSITIS 03/17/2008  . ADD 09/14/2007  . COLONIC POLYPS, HX OF 08/03/2007  . Essential hypertension 01/20/2007  . OBESITY NOS 12/04/2006  . Allergic rhinitis 12/04/2006  . GASTROJEJUNOSTOMY, HX  OF 12/04/2006  . BREAST BIOPSY, HX OF 12/04/2006    TAKACS,KELLY, PT 08/07/2015, 11:46 AM  [facility] [address], [address] [place], [place], [address] Phone: [phone]   Fax:  [phone]  Name: MONCHEL POLLITT MRN: [id number] Date of Birth: March 11, 1951

## 2015-08-09 ENCOUNTER — Telehealth: Payer: Self-pay | Admitting: Internal Medicine

## 2015-08-09 ENCOUNTER — Ambulatory Visit: Payer: Medicare Other

## 2015-08-09 DIAGNOSIS — R269 Unspecified abnormalities of gait and mobility: Secondary | ICD-10-CM

## 2015-08-09 DIAGNOSIS — R600 Localized edema: Secondary | ICD-10-CM

## 2015-08-09 DIAGNOSIS — M25562 Pain in left knee: Secondary | ICD-10-CM | POA: Diagnosis not present

## 2015-08-09 DIAGNOSIS — M25662 Stiffness of left knee, not elsewhere classified: Secondary | ICD-10-CM

## 2015-08-09 NOTE — Telephone Encounter (Signed)
Pt request refill °amphetamine-dextroamphetamine (ADDERALL XR) 30 MG 24 hr capsule °amphetamine-dextroamphetamine (ADDERALL) 20 MG tablet °3 mo supply °

## 2015-08-09 NOTE — Therapy (Signed)
[facility] [address] [place], [place], [address] Phone: [phone]   Fax:  [phone]  Physical Therapy Treatment  Patient Details  Name: Leslie [name] MRN: [id number] Date of Birth: 1951-07-09 Referring Provider: Doneen Poisson, MD  Encounter Date: 08/09/2015      PT End of Session - 08/09/15 1059    Visit Number 7   Number of Visits 10   Date for PT Re-Evaluation 09/04/15   PT Start Time 1014   PT Stop Time 1114   PT Time Calculation (min) 60 min   Activity Tolerance Patient tolerated treatment well   Behavior During Therapy [facility] for tasks assessed/performed      Past Medical History  Diagnosis Date  . Allergy   . Hypertension   . Obesity     post gastric bypass  . Colon polyps   . ADD (attention deficit disorder)   . Left knee pain     chronic  . Narcotic addiction (HCC)   . Vitamin D deficiency   . Arthritis 2016    osteoarthritis left knee    Past Surgical History  Procedure Laterality Date  . Gastric bypass  2003  . Cholecystectomy    . Kidney donation  2004  . Breast surgery  years ago    breast biopsy  . Arthscopic knee surgery Left     several times  . Eye surgery Bilateral 2013    Toric Lens implants  . Eye surgery Right 2014    corneal amniotic membrane  . Total knee arthroplasty Left 06/16/2015    Procedure: LEFT TOTAL KNEE ARTHROPLASTY;  Surgeon: Kathryne Hitch, MD;  Location: [facility];  Service: Orthopedics;  Laterality: Left;    There were no vitals filed for this visit.  Visit Diagnosis:  Leg edema, left  Knee pain, acute, left  Abnormality of gait  Stiffness of knee joint, left      Subjective Assessment - 08/09/15 1021    Subjective Lt knee was hurting yesterday due to weather.     Currently in Pain? Yes   Pain Score 2    Pain Location Knee   Pain Orientation Left   Pain Descriptors / Indicators Aching   Pain Type Surgical pain   Pain  Onset 1 to 4 weeks ago   Pain Frequency Constant   Aggravating Factors  walking, bending the knee   Pain Relieving Factors meds, ice                         [facility] Adult PT Treatment/Exercise - 08/09/15 0001    Knee/Hip Exercises: Aerobic   Nustep Level 3 x 10  seat 9, arms 10   Knee/Hip Exercises: Machines for Strengthening   Cybex Leg Press 70# Bil LE 75# bil.3x10  , 35# Lt only 3x 10  Seat #6   Knee/Hip Exercises: Standing   Lateral Step Up Left;3 sets;10 reps;Hand Hold: 1;Step Height: 8"   Forward Step Up Left;3 sets;10 reps;Hand Hold: 2;Step Height: 8"   Step Down 2 sets;10 reps;Left   Rocker Board 3 minutes   Rebounder 3 way weight shifting 1 min   Modalities   Modalities Vasopneumatic   Vasopneumatic   Number Minutes Vasopneumatic  15 minutes   Vasopnuematic Location  Knee   Vasopneumatic Pressure High   Vasopneumatic Temperature  3 snowflakes   Manual Therapy   Manual Therapy Soft tissue mobilization   Manual therapy comments soft tissue  to Lt distal hamstring insertion and distal quads, scar mobs                  PT Short Term Goals - 08/07/15 1108    PT SHORT TERM GOAL #1   Title be independent in initial HEP   Status Achieved   PT SHORT TERM GOAL #2   Status Achieved   PT SHORT TERM GOAL #3   Title wean from cane for all household distances   Status Achieved   PT SHORT TERM GOAL #4   Title improve Lt LE strength to ascend steps with step-over-step gait and use of one rail   Status Achieved   PT SHORT TERM GOAL #5   Title demonstrate Lt knee AROM flexion to > or = to 100 degrees to improve steps and squatting   Status Achieved           PT Long Term Goals - 07/10/15 0845    PT LONG TERM GOAL #1   Title be independent in advanced HEP   Time 8   Period Weeks   Status New   PT LONG TERM GOAL #2   Title reduce FOTO to < or = to 52% limitation   Time 8   Period Weeks   Status New   PT LONG TERM GOAL #3   Title stand and  walk for 30-45 minutes to improve endurance for home and community tasks   Time 8   Period Weeks   Status New   PT LONG TERM GOAL #4   Title demonstrate Lt knee flexion to > or = to 110 degrees to squat without limitation   Time 8   Period Weeks   Status New   PT LONG TERM GOAL #5   Title descend steps with step-over-step gait at least 75% of the time   Time 8   Period Weeks   Status New   Additional Long Term Goals   Additional Long Term Goals Yes   PT LONG TERM GOAL #6   Title wean from walker for all community distances and demonstrate symmetry    Time 8   Period Weeks   Status New               Plan - 08/09/15 1022    Clinical Impression Statement Pt has weaned from cane for household distances.  Pt is ascending steps with step-over-step gait pattern and continues to descend steps with step-to pattern.  Pt demonstates good technique with 8" step up forward and lateral.  pt with continued Lt knee reduced AROM, functional strrength and endurance.  Pt will continue to benefit from skilled PT for strength, endurance, ROM and edema management.     Pt will benefit from skilled therapeutic intervention in order to improve on the following deficits Abnormal gait;Decreased range of motion;Difficulty walking;Increased edema;Decreased strength;Decreased scar mobility;Impaired flexibility;Pain;Decreased activity tolerance;Decreased endurance   Rehab Potential Good   PT Frequency 2x / week   PT Duration 8 weeks   PT Treatment/Interventions ADLs/Self Care Home Management;Cryotherapy;Electrical Stimulation;Moist Heat;Therapeutic exercise;Therapeutic activities;Functional mobility training;Stair training;Gait training;Ultrasound;Neuromuscular re-education;Patient/family education;Manual techniques;Vasopneumatic Device;Passive range of motion;Scar mobilization   PT Next Visit Plan Strength, ROM, edema management. Try resisted walking.        Problem List Patient Active Problem List    Diagnosis Date Noted  . Osteoarthritis of left knee 06/16/2015  . Status post total left knee replacement 06/16/2015  . MYALGIA 12/19/2009  . SUBSTANCE ABUSE 02/06/2009  . KNEE PAIN,  LEFT 10/06/2008  . URI 07/11/2008  . ACUTE MAXILLARY SINUSITIS 03/17/2008  . ADD 09/14/2007  . COLONIC POLYPS, HX OF 08/03/2007  . Essential hypertension 01/20/2007  . OBESITY NOS 12/04/2006  . Allergic rhinitis 12/04/2006  . GASTROJEJUNOSTOMY, HX OF 12/04/2006  . BREAST BIOPSY, HX OF 12/04/2006    Madeline Bebout, PT 08/09/2015, 10:59 AM  [facility] [address], [address] [place], [place], [address] Phone: [phone]   Fax:  [phone]  Name: Leslie [name] MRN: [id number] Date of Birth: [birth year]

## 2015-08-10 MED ORDER — AMPHETAMINE-DEXTROAMPHET ER 30 MG PO CP24: 30.0000 mg | ORAL_CAPSULE | ORAL | Status: DC

## 2015-08-10 MED ORDER — AMPHETAMINE-DEXTROAMPHETAMINE 20 MG PO TABS: ORAL_TABLET | ORAL | Status: DC

## 2015-08-10 NOTE — Telephone Encounter (Signed)
Left message on personal voicemail Rx's ready for pickup, will be at the front desk. Rx's printed and signed. 

## 2015-08-14 ENCOUNTER — Encounter: Payer: Self-pay | Admitting: Physical Therapy

## 2015-08-14 ENCOUNTER — Ambulatory Visit: Payer: Medicare Other | Admitting: Physical Therapy

## 2015-08-14 NOTE — Progress Notes (Signed)
This encounter was created in error - please disregard.

## 2015-08-16 ENCOUNTER — Ambulatory Visit: Payer: Medicare Other

## 2015-08-16 ENCOUNTER — Telehealth: Payer: Self-pay

## 2015-08-16 NOTE — Telephone Encounter (Signed)
PT called pt due to missed appt today.  PT left message for pt to call back.

## 2015-08-21 ENCOUNTER — Ambulatory Visit: Payer: Medicare Other | Admitting: Physical Therapy

## 2015-08-21 ENCOUNTER — Encounter: Payer: Self-pay | Admitting: Physical Therapy

## 2015-08-21 DIAGNOSIS — M25562 Pain in left knee: Secondary | ICD-10-CM

## 2015-08-21 DIAGNOSIS — M25662 Stiffness of left knee, not elsewhere classified: Secondary | ICD-10-CM

## 2015-08-21 DIAGNOSIS — R600 Localized edema: Secondary | ICD-10-CM

## 2015-08-21 DIAGNOSIS — R269 Unspecified abnormalities of gait and mobility: Secondary | ICD-10-CM

## 2015-08-21 NOTE — Therapy (Signed)
[facility] [address] [place], [place], [address] Phone: [phone]   Fax:  [phone]  Physical Therapy Treatment  Patient Details  Name: Leslie [name] MRN: [id number] Date of Birth: 10/29/1950 Referring Provider: Jean Rosenthal, MD  Encounter Date: 08/21/2015      PT End of Session - 08/21/15 1029    Visit Number 8   Number of Visits 10   Date for PT Re-Evaluation 09/04/15   PT Start Time 1017   PT Stop Time 1117   PT Time Calculation (min) 60 min   Activity Tolerance Patient tolerated treatment well   Behavior During Therapy [facility] for tasks assessed/performed      Past Medical History  Diagnosis Date  . Allergy   . Hypertension   . Obesity     post gastric bypass  . Colon polyps   . ADD (attention deficit disorder)   . Left knee pain     chronic  . Narcotic addiction ([place])   . Vitamin D deficiency   . Arthritis 2016    osteoarthritis left knee    Past Surgical History  Procedure Laterality Date  . Gastric bypass  2003  . Cholecystectomy    . Kidney donation  2004  . Breast surgery  years ago    breast biopsy  . Arthscopic knee surgery Left     several times  . Eye surgery Bilateral 2013    Toric Lens implants  . Eye surgery Right 2014    corneal amniotic membrane  . Total knee arthroplasty Left 06/16/2015    Procedure: LEFT TOTAL KNEE ARTHROPLASTY;  Surgeon: Mcarthur Rossetti, MD;  Location: [facility];  Service: Orthopedics;  Laterality: Left;    There were no vitals filed for this visit.  Visit Diagnosis:  Leg edema, left  Knee pain, acute, left  Abnormality of gait  Stiffness of knee joint, left      Subjective Assessment - 08/21/15 1026    Subjective Left knee is very sore today, pt was bowling yesterday for two hours.    Limitations Standing;Walking   How long can you sit comfortably? get tired   How long can you stand comfortably? 15-20 minutes   Patient  Stated Goals improve gait, reduce pain, improve strength and AROM   Currently in Pain? Yes   Pain Score 3    Pain Location Knee   Pain Orientation Left   Pain Descriptors / Indicators Sore   Pain Type Surgical pain   Pain Onset 1 to 4 weeks ago   Pain Frequency Constant   Aggravating Factors  walking, bending the knee    Pain Relieving Factors meds, ice   Multiple Pain Sites No                         [facility] Adult PT Treatment/Exercise - 08/21/15 0001    Exercises   Exercises Knee/Hip   Knee/Hip Exercises: Aerobic   Nustep Level 3 x 10  seat #9, arms #10, 0.71mh   Knee/Hip Exercises: Machines for Strengthening   Cybex Leg Press 80# Bil LE 3x10, 40# Lt only 3x 10   Knee/Hip Exercises: Standing   Lateral Step Up Left;2 sets;10 reps;Hand Hold: 0;Step Height: 6"   Forward Step Up Left;2 sets;10 reps;Hand Hold: 0;Step Height: 6"   Step Down 2 sets;10 reps;Left;Step Height: 6"   Rebounder 3 way weight shifting 1 min   Walking with Sports Cord 25# forward/reverse,  backward/reverse x 10 with S for safety   Modalities   Modalities Vasopneumatic   Vasopneumatic   Number Minutes Vasopneumatic  15 minutes   Vasopnuematic Location  Knee   Vasopneumatic Pressure High   Vasopneumatic Temperature  3 snowflakes   Manual Therapy   Manual Therapy Soft tissue mobilization   Manual therapy comments soft tissue to Lt distal hamstring insertion and distal quads, scar mobs                  PT Short Term Goals - 08/07/15 1108    PT SHORT TERM GOAL #1   Title be independent in initial HEP   Status Achieved   PT SHORT TERM GOAL #2   Status Achieved   PT SHORT TERM GOAL #3   Title wean from cane for all household distances   Status Achieved   PT SHORT TERM GOAL #4   Title improve Lt LE strength to ascend steps with step-over-step gait and use of one rail   Status Achieved   PT SHORT TERM GOAL #5   Title demonstrate Lt knee AROM flexion to > or = to 100 degrees to  improve steps and squatting   Status Achieved           PT Long Term Goals - 08/21/15 1032    PT LONG TERM GOAL #1   Title be independent in advanced HEP   Time 8   Period Weeks   Status On-going   PT LONG TERM GOAL #2   Title reduce FOTO to < or = to 52% limitation   Time 8   Period Weeks   Status On-going   PT LONG TERM GOAL #3   Title stand and walk for 30-45 minutes to improve endurance for home and community tasks   Time 8   Period Weeks   Status On-going   PT LONG TERM GOAL #4   Title demonstrate Lt knee flexion to > or = to 110 degrees to squat without limitation   Time 8   Period Weeks   Status On-going   PT LONG TERM GOAL #5   Title descend steps with step-over-step gait at least 75% of the time   Time 8   Period Weeks   Status Partially Met   PT LONG TERM GOAL #6   Title wean from walker for all community distances and demonstrate symmetry    Time 8   Period Weeks   Status Achieved               Plan - 08/21/15 1029    Clinical Impression Statement Pt was able to bowl for two hours yesterday, but feels sore today. Pt is progressing toward all goals. Pt will continue to benefit from skilled PT to improve strength adn endurance.    Pt will benefit from skilled therapeutic intervention in order to improve on the following deficits Abnormal gait;Decreased range of motion;Difficulty walking;Increased edema;Decreased strength;Decreased scar mobility;Impaired flexibility;Pain;Decreased activity tolerance;Decreased endurance   Rehab Potential Good   PT Frequency 2x / week   PT Duration 8 weeks   PT Treatment/Interventions ADLs/Self Care Home Management;Cryotherapy;Electrical Stimulation;Moist Heat;Therapeutic exercise;Therapeutic activities;Functional mobility training;Stair training;Gait training;Ultrasound;Neuromuscular re-education;Patient/family education;Manual techniques;Vasopneumatic Device;Passive range of motion;Scar mobilization   PT Next Visit Plan  Foto, G-Codes, Strength, ROM, edema management. Continue resisted walking.   Consulted and Agree with Plan of Care Patient        Problem List Patient Active Problem List   Diagnosis Date Noted  .  Osteoarthritis of left knee 06/16/2015  . Status post total left knee replacement 06/16/2015  . MYALGIA 12/19/2009  . SUBSTANCE ABUSE 02/06/2009  . KNEE PAIN, LEFT 10/06/2008  . URI 07/11/2008  . ACUTE MAXILLARY SINUSITIS 03/17/2008  . ADD 09/14/2007  . COLONIC POLYPS, HX OF 08/03/2007  . Essential hypertension 01/20/2007  . OBESITY NOS 12/04/2006  . Allergic rhinitis 12/04/2006  . GASTROJEJUNOSTOMY, HX OF 12/04/2006  . BREAST BIOPSY, HX OF 12/04/2006    NAUMANN-HOUEGNIFIO,Lilymae Swiech PTA 08/21/2015, 11:03 AM  [facility] [address] [place], [place], [address] Phone: [phone]   Fax:  [phone]  Name: Leslie [name] MRN: [id number] Date of Birth: February 23, 1951

## 2015-08-23 ENCOUNTER — Encounter: Payer: Self-pay | Admitting: Physical Therapy

## 2015-08-23 ENCOUNTER — Ambulatory Visit: Payer: Medicare Other | Attending: Orthopaedic Surgery | Admitting: Physical Therapy

## 2015-08-23 DIAGNOSIS — R600 Localized edema: Secondary | ICD-10-CM | POA: Insufficient documentation

## 2015-08-23 DIAGNOSIS — M25562 Pain in left knee: Secondary | ICD-10-CM | POA: Insufficient documentation

## 2015-08-23 DIAGNOSIS — M25662 Stiffness of left knee, not elsewhere classified: Secondary | ICD-10-CM | POA: Diagnosis present

## 2015-08-23 DIAGNOSIS — R269 Unspecified abnormalities of gait and mobility: Secondary | ICD-10-CM | POA: Insufficient documentation

## 2015-08-23 NOTE — Therapy (Signed)
[facility] [address] [place], [place], [address] Phone: [phone]   Fax:  [phone]  Physical Therapy Treatment  Patient Details  Name: Leslie [name] MRN: [id number] Date of Birth: [birth year] Referring Provider: Jean Rosenthal, MD  Encounter Date: 08/23/2015      PT End of Session - 08/23/15 1037    Visit Number 9  pt had no visit on 08-14-15   Number of Visits 10   Date for PT Re-Evaluation 09/04/15   PT Start Time 2122   PT Stop Time 1117   PT Time Calculation (min) 62 min   Activity Tolerance Patient tolerated treatment well   Behavior During Therapy [facility] for tasks assessed/performed      Past Medical History  Diagnosis Date  . Allergy   . Hypertension   . Obesity     post gastric bypass  . Colon polyps   . ADD (attention deficit disorder)   . Left knee pain     chronic  . Narcotic addiction ([place])   . Vitamin D deficiency   . Arthritis 2016    osteoarthritis left knee    Past Surgical History  Procedure Laterality Date  . Gastric bypass  2003  . Cholecystectomy    . Kidney donation  2004  . Breast surgery  years ago    breast biopsy  . Arthscopic knee surgery Left     several times  . Eye surgery Bilateral 2013    Toric Lens implants  . Eye surgery Right 2014    corneal amniotic membrane  . Total knee arthroplasty Left 06/16/2015    Procedure: LEFT TOTAL KNEE ARTHROPLASTY;  Surgeon: Mcarthur Rossetti, MD;  Location: [facility];  Service: Orthopedics;  Laterality: Left;    There were no vitals filed for this visit.  Visit Diagnosis:  Leg edema, left  Knee pain, acute, left  Abnormality of gait  Stiffness of knee joint, left      Subjective Assessment - 08/23/15 1032    Subjective Lt knee is still sore, but less than last visit.    Limitations Standing;Walking   How long can you sit comfortably? get tired   How long can you stand comfortably? 15-20 minutes    Patient Stated Goals improve gait, reduce pain, improve strength and AROM   Currently in Pain? Yes   Pain Score 1    Pain Location Knee   Pain Orientation Left   Pain Descriptors / Indicators Sore   Pain Type Surgical pain   Pain Onset 1 to 4 weeks ago   Pain Frequency Constant   Aggravating Factors  walking, bending the knee   Pain Relieving Factors meds, ice   Multiple Pain Sites No                         [facility] Adult PT Treatment/Exercise - 08/23/15 0001    Exercises   Exercises Knee/Hip   Knee/Hip Exercises: Aerobic   Nustep Level 3 x 10  seat & arms # 9   Knee/Hip Exercises: Machines for Strengthening   Cybex Leg Press 85# Bil LE 3x10, 40# Lt only 3x 10   Knee/Hip Exercises: Standing   Lateral Step Up Left;3 sets;10 reps;Hand Hold: 1;Step Height: 6"   Forward Step Up Left;3 sets;10 reps;Hand Hold: 1;Step Height: 6"   Step Down Left;3 sets;10 reps;Hand Hold: 2;Step Height: 6"   Rebounder 3 way weight shifting 1 min   Walking  with Sports Cord 30# forward/reverse, backward/reverse x 10 with S for safety   Modalities   Modalities Vasopneumatic   Vasopneumatic   Number Minutes Vasopneumatic  15 minutes   Vasopnuematic Location  Knee   Vasopneumatic Pressure High   Vasopneumatic Temperature  3 snowflakes   Manual Therapy   Manual Therapy Soft tissue mobilization   Manual therapy comments soft tissue to Lt distal hamstring insertion and distal quads, scar mobs                  PT Short Term Goals - 08/07/15 1108    PT SHORT TERM GOAL #1   Title be independent in initial HEP   Status Achieved   PT SHORT TERM GOAL #2   Status Achieved   PT SHORT TERM GOAL #3   Title wean from cane for all household distances   Status Achieved   PT SHORT TERM GOAL #4   Title improve Lt LE strength to ascend steps with step-over-step gait and use of one rail   Status Achieved   PT SHORT TERM GOAL #5   Title demonstrate Lt knee AROM flexion to > or = to 100  degrees to improve steps and squatting   Status Achieved           PT Long Term Goals - 08/21/15 1032    PT LONG TERM GOAL #1   Title be independent in advanced HEP   Time 8   Period Weeks   Status On-going   PT LONG TERM GOAL #2   Title reduce FOTO to < or = to 52% limitation   Time 8   Period Weeks   Status On-going   PT LONG TERM GOAL #3   Title stand and walk for 30-45 minutes to improve endurance for home and community tasks   Time 8   Period Weeks   Status On-going   PT LONG TERM GOAL #4   Title demonstrate Lt knee flexion to > or = to 110 degrees to squat without limitation   Time 8   Period Weeks   Status On-going   PT LONG TERM GOAL #5   Title descend steps with step-over-step gait at least 75% of the time   Time 8   Period Weeks   Status Partially Met   PT LONG TERM GOAL #6   Title wean from walker for all community distances and demonstrate symmetry    Time 8   Period Weeks   Status Achieved               Plan - 08/23/15 1039    Clinical Impression Statement Pt continues to improve with ROM, endurance and strength.    Pt will benefit from skilled therapeutic intervention in order to improve on the following deficits Abnormal gait;Decreased range of motion;Difficulty walking;Increased edema;Decreased strength;Decreased scar mobility;Impaired flexibility;Pain;Decreased activity tolerance;Decreased endurance   Rehab Potential Good   PT Frequency 2x / week   PT Duration 8 weeks   PT Next Visit Plan Foto, G-Codes, Strength, ROM, edema management. Continue resisted walking.   Consulted and Agree with Plan of Care Patient        Problem List Patient Active Problem List   Diagnosis Date Noted  . Osteoarthritis of left knee 06/16/2015  . Status post total left knee replacement 06/16/2015  . MYALGIA 12/19/2009  . SUBSTANCE ABUSE 02/06/2009  . KNEE PAIN, LEFT 10/06/2008  . URI 07/11/2008  . ACUTE MAXILLARY SINUSITIS 03/17/2008  . ADD 09/14/2007   .  COLONIC POLYPS, HX OF 08/03/2007  . Essential hypertension 01/20/2007  . OBESITY NOS 12/04/2006  . Allergic rhinitis 12/04/2006  . GASTROJEJUNOSTOMY, HX OF 12/04/2006  . BREAST BIOPSY, HX OF 12/04/2006    NAUMANN-HOUEGNIFIO,Laniyah Rosenwald PTA 08/23/2015, 11:02 AM  [facility] [address], [place], [place], [address] Phone: [phone]   Fax:  [phone]  Name: Leslie [name] MRN: [id number] Date of Birth: 02-24-1951

## 2015-08-28 ENCOUNTER — Ambulatory Visit: Payer: BC Managed Care – PPO

## 2015-08-30 ENCOUNTER — Ambulatory Visit: Payer: Medicare Other

## 2015-09-04 ENCOUNTER — Ambulatory Visit: Payer: Medicare Other

## 2015-09-06 ENCOUNTER — Ambulatory Visit: Payer: Medicare Other

## 2015-09-06 DIAGNOSIS — R269 Unspecified abnormalities of gait and mobility: Secondary | ICD-10-CM

## 2015-09-06 DIAGNOSIS — R600 Localized edema: Secondary | ICD-10-CM

## 2015-09-06 DIAGNOSIS — M25662 Stiffness of left knee, not elsewhere classified: Secondary | ICD-10-CM

## 2015-09-06 DIAGNOSIS — M25562 Pain in left knee: Secondary | ICD-10-CM

## 2015-09-06 NOTE — Therapy (Signed)
[facility] [address], [place], [place], [address] Phone: [phone]   Fax:  [phone]  Physical Therapy Treatment  Patient Details  Name: Leslie [name] MRN: [id number] Date of Birth: 1951-03-12 Referring Provider: Jean Rosenthal, MD  Encounter Date: 09/06/2015      PT End of Session - 09/06/15 1039    Visit Number 10   PT Start Time 1003   PT Stop Time 1058   PT Time Calculation (min) 55 min   Activity Tolerance Patient tolerated treatment well   Behavior During Therapy [facility] for tasks assessed/performed      Past Medical History  Diagnosis Date  . Allergy   . Hypertension   . Obesity     post gastric bypass  . Colon polyps   . ADD (attention deficit disorder)   . Left knee pain     chronic  . Narcotic addiction ([place])   . Vitamin D deficiency   . Arthritis 2016    osteoarthritis left knee    Past Surgical History  Procedure Laterality Date  . Gastric bypass  2003  . Cholecystectomy    . Kidney donation  2004  . Breast surgery  years ago    breast biopsy  . Arthscopic knee surgery Left     several times  . Eye surgery Bilateral 2013    Toric Lens implants  . Eye surgery Right 2014    corneal amniotic membrane  . Total knee arthroplasty Left 06/16/2015    Procedure: LEFT TOTAL KNEE ARTHROPLASTY;  Surgeon: Mcarthur Rossetti, MD;  [facility]: [facility];  Service: Orthopedics;  Laterality: Left;    There were no vitals filed for this visit.  Visit Diagnosis:  Leg edema, left  Knee pain, acute, left  Abnormality of gait  Stiffness of knee joint, left      Subjective Assessment - 09/06/15 1013    Subjective Ready for D/C today.  Saw MD last week and he was pleased.  Pain is intermittent in Lt knee.            [facility] PT Assessment - 09/06/15 0001    Assessment   Medical Diagnosis s/p Lt TKA   Onset Date/Surgical Date 06/16/15   Prior Function   Level of Independence  Independent   Vocation Retired   Observation/Other Assessments   Focus on Therapeutic Outcomes (FOTO)  31% limitation   ROM / Strength   AROM / PROM / Strength AROM;PROM;Strength   AROM   Overall AROM  Deficits   AROM Assessment Site Knee   Right/Left Knee Left   PROM   Overall PROM  Deficits   PROM Assessment Site Knee   Right/Left Knee Left   Left Knee Extension 0   Left Knee Flexion 107   Strength   Overall Strength Within functional limits for tasks performed   Strength Assessment Site Knee   Right/Left Knee Left   Left Knee Flexion 5/5   Left Knee Extension 4+/5                     [facility] Adult PT Treatment/Exercise - 09/06/15 0001    Knee/Hip Exercises: Aerobic   Nustep Level 3 x 10  seat & arms # 9   Knee/Hip Exercises: Machines for Strengthening   Cybex Leg Press 85# Bil LE 3x10, 40# Lt only 3x 10  Seat #6   Knee/Hip Exercises: Standing   Lateral Step Up Left;3 sets;10 reps;Hand Hold: 1;Step Height:  6"   Forward Step Up Left;3 sets;10 reps;Hand Hold: 1;Step Height: 6"   Step Down Left;3 sets;10 reps;Hand Hold: 2;Step Height: 6"   Rocker Board 3 minutes   Rebounder 3 way weight shifting 1 min   Walking with Sports Cord 30# forward/reverse, backward/reverse x 10 with S for safety   Modalities   Modalities Vasopneumatic   Vasopneumatic   Number Minutes Vasopneumatic  15 minutes   Vasopnuematic Location  Knee   Vasopneumatic Pressure High   Vasopneumatic Temperature  3 snowflakes                  PT Short Term Goals - 08/07/15 1108    PT SHORT TERM GOAL #1   Title be independent in initial HEP   Status Achieved   PT SHORT TERM GOAL #2   Status Achieved   PT SHORT TERM GOAL #3   Title wean from cane for all household distances   Status Achieved   PT SHORT TERM GOAL #4   Title improve Lt LE strength to ascend steps with step-over-step gait and use of one rail   Status Achieved   PT SHORT TERM GOAL #5   Title demonstrate Lt knee AROM  flexion to > or = to 100 degrees to improve steps and squatting   Status Achieved           PT Long Term Goals - 2015-09-12 1022    PT LONG TERM GOAL #1   Title be independent in advanced HEP   Status Achieved   PT LONG TERM GOAL #2   Title reduce FOTO to < or = to 52% limitation   Status Achieved   PT LONG TERM GOAL #3   Title stand and walk for 30-45 minutes to improve endurance for home and community tasks   Status Achieved   PT LONG TERM GOAL #4   Title demonstrate Lt knee flexion to > or = to 110 degrees to squat without limitation   Status Partially Met  107   PT LONG TERM GOAL #5   Title descend steps with step-over-step gait at least 75% of the time   Status On-going  <10% of the time   PT LONG TERM GOAL #6   Title wean from walker for all community distances and demonstrate symmetry    Status Achieved               Plan - 09-12-2015 1023    Clinical Impression Statement Pt is ready for D/C to HEP.  Pt with 0-107 degrees flexion and 4+/5 Lt quad strength.  Pt with decreased eccentric strength with descending steps.  Pt is able to walk > 1 hour in the community.  Pt with continued Lt knee edema s/p TKA.  Pt will continue with HEP and transition to gym exercise program.     PT Next Visit Plan D/C PT to HEP   Consulted and Agree with Plan of Care Patient          G-Codes - 09-12-2015 1021    Functional Assessment Tool Used FOTO: 31% limitation   Functional Limitation Mobility: Walking and moving around   Mobility: Walking and Moving Around Goal Status [phone]) At least 20 percent but less than 40 percent impaired, limited or restricted   Mobility: Walking and Moving Around Discharge Status [phone]) At least 20 percent but less than 40 percent impaired, limited or restricted      Problem List Patient Active Problem List   Diagnosis  Date Noted  . Osteoarthritis of left knee 06/16/2015  . Status post total left knee replacement 06/16/2015  . MYALGIA 12/19/2009   . SUBSTANCE ABUSE 02/06/2009  . KNEE PAIN, LEFT 10/06/2008  . URI 07/11/2008  . ACUTE MAXILLARY SINUSITIS 03/17/2008  . ADD 09/14/2007  . COLONIC POLYPS, HX OF 08/03/2007  . Essential hypertension 01/20/2007  . OBESITY NOS 12/04/2006  . Allergic rhinitis 12/04/2006  . GASTROJEJUNOSTOMY, HX OF 12/04/2006  . BREAST BIOPSY, HX OF 12/04/2006   PHYSICAL THERAPY DISCHARGE SUMMARY  Visits from Start of Care: 10   Current functional level related to goals / functional outcomes: See above.  Reduced eccentric strength with descending steps.  Pt has HEP in place.     Remaining deficits: See above.    Education / Equipment: HEP Plan: Patient agrees to discharge.  Patient goals were met. Patient is being discharged due to meeting the stated rehab goals.  ?????     Deriana Vanderhoef, PT 09/06/2015, 10:40 AM  [facility] [address], [place], [place], [address] Phone: [phone]   Fax:  [phone]  Name: DASHANTI BURR MRN: [id number] Date of Birth: 05/05/1951

## 2015-10-24 ENCOUNTER — Other Ambulatory Visit: Payer: Self-pay | Admitting: Internal Medicine

## 2015-10-24 ENCOUNTER — Telehealth: Payer: Self-pay | Admitting: Internal Medicine

## 2015-10-24 MED ORDER — AMPHETAMINE-DEXTROAMPHETAMINE 20 MG PO TABS: ORAL_TABLET | ORAL | Status: DC

## 2015-10-24 MED ORDER — AMPHETAMINE-DEXTROAMPHET ER 30 MG PO CP24: 30.0000 mg | ORAL_CAPSULE | ORAL | Status: DC

## 2015-10-24 NOTE — Telephone Encounter (Signed)
Left message on voicemail Rx's ready for pickup. Rx's printed and signed.  

## 2015-10-24 NOTE — Telephone Encounter (Signed)
Pt request refill of the following: amphetamine-dextroamphetamine (ADDERALL XR) 30 MG 24 hr capsule ,  amphetamine-dextroamphetamine (ADDERALL) 20 MG tablet   Phamacy:

## 2015-12-21 ENCOUNTER — Other Ambulatory Visit: Payer: Self-pay | Admitting: General Practice

## 2015-12-21 MED ORDER — GABAPENTIN 300 MG PO CAPS: 300.0000 mg | ORAL_CAPSULE | Freq: Every day | ORAL | Status: DC

## 2016-01-05 ENCOUNTER — Other Ambulatory Visit: Payer: Self-pay | Admitting: *Deleted

## 2016-01-05 MED ORDER — FLUOXETINE HCL 40 MG PO CAPS: 40.0000 mg | ORAL_CAPSULE | Freq: Every day | ORAL | Status: DC

## 2016-01-19 ENCOUNTER — Other Ambulatory Visit: Payer: Self-pay | Admitting: Internal Medicine

## 2016-01-19 NOTE — Telephone Encounter (Signed)
Okay to refill? 

## 2016-01-19 NOTE — Telephone Encounter (Signed)
Pt need new Rx for lamotrigine and ambien     Pharm:  [facility]

## 2016-01-24 ENCOUNTER — Encounter: Payer: Self-pay | Admitting: Internal Medicine

## 2016-01-24 ENCOUNTER — Telehealth: Payer: Self-pay | Admitting: Internal Medicine

## 2016-01-24 MED ORDER — LAMOTRIGINE 100 MG PO TABS: 100.0000 mg | ORAL_TABLET | Freq: Two times a day (BID) | ORAL | Status: DC

## 2016-01-24 MED ORDER — ZOLPIDEM TARTRATE 10 MG PO TABS: ORAL_TABLET | ORAL | Status: DC

## 2016-01-24 NOTE — Telephone Encounter (Signed)
Spoke to pt, told her Rx's for Zolpidem and Lamotrigne was sent to pharmacy. I will have Rx's for Adderall ready on Friday. Pt verbalized understanding and said that is fine.

## 2016-01-24 NOTE — Telephone Encounter (Signed)
Okay to refill? 

## 2016-01-24 NOTE — Telephone Encounter (Addendum)
Pt needs new rxs generic adderall xr 30 mg and generic 20 mg. Pt also needs refills on zolpidem and lamotrigne [facility] [address]

## 2016-01-25 NOTE — Telephone Encounter (Signed)
Refills phoned-in. Closing encounter.

## 2016-01-26 MED ORDER — AMPHETAMINE-DEXTROAMPHETAMINE 20 MG PO TABS: ORAL_TABLET | ORAL | Status: DC

## 2016-01-26 MED ORDER — AMPHETAMINE-DEXTROAMPHET ER 30 MG PO CP24
30.0000 mg | ORAL_CAPSULE | ORAL | Status: DC
Start: 2016-01-26 — End: 2016-05-24

## 2016-01-26 MED ORDER — AMPHETAMINE-DEXTROAMPHET ER 30 MG PO CP24: 30.0000 mg | ORAL_CAPSULE | ORAL | Status: DC

## 2016-01-26 NOTE — Telephone Encounter (Signed)
Spoke to pt, told her Rx's are ready for pickup, will be at the front desk. Rx's printed and signed.

## 2016-03-29 ENCOUNTER — Other Ambulatory Visit: Payer: Self-pay | Admitting: Internal Medicine

## 2016-04-04 ENCOUNTER — Other Ambulatory Visit: Payer: Self-pay | Admitting: Internal Medicine

## 2016-04-04 DIAGNOSIS — Z1231 Encounter for screening mammogram for malignant neoplasm of breast: Secondary | ICD-10-CM

## 2016-04-09 ENCOUNTER — Other Ambulatory Visit: Payer: Self-pay | Admitting: Orthopaedic Surgery

## 2016-04-09 DIAGNOSIS — M25512 Pain in left shoulder: Secondary | ICD-10-CM

## 2016-04-15 ENCOUNTER — Ambulatory Visit
Admission: RE | Admit: 2016-04-15 | Discharge: 2016-04-15 | Disposition: A | Discharge status: Home / Self Care | Payer: Medicare Other | Source: Ambulatory Visit | Attending: Internal Medicine | Admitting: Internal Medicine

## 2016-04-15 ENCOUNTER — Other Ambulatory Visit: Payer: Self-pay | Admitting: Internal Medicine

## 2016-04-15 DIAGNOSIS — Z1231 Encounter for screening mammogram for malignant neoplasm of breast: Secondary | ICD-10-CM

## 2016-04-16 ENCOUNTER — Ambulatory Visit
Admission: RE | Admit: 2016-04-16 | Discharge: 2016-04-16 | Disposition: A | Discharge status: Home / Self Care | Payer: Medicare Other | Source: Ambulatory Visit | Attending: Orthopaedic Surgery | Admitting: Orthopaedic Surgery

## 2016-04-16 DIAGNOSIS — M25512 Pain in left shoulder: Secondary | ICD-10-CM

## 2016-04-16 NOTE — Telephone Encounter (Signed)
Ok to refill 

## 2016-04-22 ENCOUNTER — Ambulatory Visit (INDEPENDENT_AMBULATORY_CARE_PROVIDER_SITE_OTHER): Payer: Self-pay | Admitting: Orthopaedic Surgery

## 2016-04-24 ENCOUNTER — Other Ambulatory Visit: Payer: Self-pay | Admitting: Internal Medicine

## 2016-04-24 MED ORDER — AMPHETAMINE-DEXTROAMPHETAMINE 20 MG PO TABS: ORAL_TABLET | ORAL | 0 refills | Status: DC

## 2016-04-24 MED ORDER — AMPHETAMINE-DEXTROAMPHET ER 30 MG PO CP24: 30.0000 mg | ORAL_CAPSULE | ORAL | 0 refills | Status: DC

## 2016-04-24 NOTE — Telephone Encounter (Signed)
Left detailed message on personal voicemail Rx's ready for pickup will be at the front desk. Rx's printed and signed by Dr. Tawanna Coolerodd.

## 2016-04-26 ENCOUNTER — Encounter: Payer: Self-pay | Admitting: Internal Medicine

## 2016-04-29 ENCOUNTER — Ambulatory Visit (INDEPENDENT_AMBULATORY_CARE_PROVIDER_SITE_OTHER): Payer: Medicare Other | Admitting: Orthopaedic Surgery

## 2016-04-29 DIAGNOSIS — M7542 Impingement syndrome of left shoulder: Secondary | ICD-10-CM | POA: Diagnosis not present

## 2016-04-29 DIAGNOSIS — M12819 Other specific arthropathies, not elsewhere classified, unspecified shoulder: Secondary | ICD-10-CM | POA: Diagnosis not present

## 2016-05-22 ENCOUNTER — Other Ambulatory Visit: Payer: Self-pay | Admitting: Internal Medicine

## 2016-05-22 ENCOUNTER — Telehealth (INDEPENDENT_AMBULATORY_CARE_PROVIDER_SITE_OTHER): Payer: Self-pay | Admitting: Orthopaedic Surgery

## 2016-05-22 NOTE — Telephone Encounter (Signed)
Patient would like Dr. Magnus IvanBlackman to call her to discuss shoulder scope.  She has some questions she needs answered before proceeding.  [phone]

## 2016-05-23 NOTE — Telephone Encounter (Signed)
I spoke with her answered her questions. Still plans to proceed with surgery next week.

## 2016-05-24 ENCOUNTER — Telehealth: Payer: Self-pay | Admitting: Internal Medicine

## 2016-05-24 MED ORDER — AMPHETAMINE-DEXTROAMPHET ER 30 MG PO CP24: 30.0000 mg | ORAL_CAPSULE | ORAL | 0 refills | Status: DC

## 2016-05-24 MED ORDER — AMPHETAMINE-DEXTROAMPHETAMINE 20 MG PO TABS: ORAL_TABLET | ORAL | 0 refills | Status: DC

## 2016-05-24 NOTE — Telephone Encounter (Signed)
Pt notified Rx's ready for pickup will be at the front desk. Rx's printed and signed.

## 2016-05-24 NOTE — Telephone Encounter (Signed)
° °  Pt request refill of the following: ° °amphetamine-dextroamphetamine (ADDERALL XR) 30 MG 24 hr capsule ° °amphetamine-dextroamphetamine (ADDERALL) 20 MG tablet ° ° °Phamacy:  °

## 2016-05-30 ENCOUNTER — Inpatient Hospital Stay (INDEPENDENT_AMBULATORY_CARE_PROVIDER_SITE_OTHER): Payer: Medicare Other | Admitting: Physician Assistant

## 2016-05-30 DIAGNOSIS — M75122 Complete rotator cuff tear or rupture of left shoulder, not specified as traumatic: Secondary | ICD-10-CM | POA: Diagnosis not present

## 2016-06-03 ENCOUNTER — Telehealth (INDEPENDENT_AMBULATORY_CARE_PROVIDER_SITE_OTHER): Payer: Self-pay | Admitting: Orthopaedic Surgery

## 2016-06-03 MED ORDER — HYDROCODONE-ACETAMINOPHEN 5-325 MG PO TABS: 1.0000 | ORAL_TABLET | Freq: Four times a day (QID) | ORAL | 0 refills | Status: DC | PRN

## 2016-06-03 NOTE — Telephone Encounter (Signed)
Patient states that the pain medication she is currently taking is to strong & making her itch, and she would like something different. She also wants to know when she can drive

## 2016-06-03 NOTE — Telephone Encounter (Signed)
Can come and pick up hydrocodone script

## 2016-06-03 NOTE — Telephone Encounter (Signed)
Please advise 

## 2016-06-03 NOTE — Telephone Encounter (Signed)
Patient aware Rx ready at front desk and aware she can drive PER Dr. Magnus IvanBlackman

## 2016-06-06 ENCOUNTER — Ambulatory Visit (INDEPENDENT_AMBULATORY_CARE_PROVIDER_SITE_OTHER): Payer: Medicare Other | Admitting: Physician Assistant

## 2016-06-06 DIAGNOSIS — Z9889 Other specified postprocedural states: Secondary | ICD-10-CM

## 2016-06-06 NOTE — Progress Notes (Signed)
Office Visit Note   Patient: Leslie [name]           Date of Birth: July 26, 1950           MRN: [id number] Visit Date: 06/06/2016              Requested by: Gordy SaversPeter F Kwiatkowski, MD [address] [place], [place] [id number] PCP: Rogelia BogaKWIATKOWSKI,PETER FRANK, MD   Assessment & Plan: Visit Diagnoses: No diagnosis found.  Plan: Sutures removed Steri-Strips applied. Continue arm sling swath. Physical therapy to begin range of motion they are to slow with her. Overhead activity extreme abduction or external rotation.  Follow-Up Instructions: No Follow-up on file.   Orders:  No orders of the defined types were placed in this encounter.  No orders of the defined types were placed in this encounter.     Procedures: No procedures performed   Clinical Data: No additional findings.   Subjective: No chief complaint on file.   HPI  Mrs.Valentino Noseeeples is overall doing well pain is dissipating. Taking occasional Norco. Percocet causes itching  Review of Systems   Objective: Vital Signs: There were no vitals taken for this visit.  Physical Exam  Constitutional: She is oriented to person, place, and time. She appears well-developed and well-nourished. No distress.  Cardiovascular:  Left radial pulse intact  Neurological: She is alert and oriented to person, place, and time.    Ortho Exam Surgical incisions left shoulder all well approximated with nylon sutures no signs of infection. She has full supination pronation forearm good range of motion of her left elbow Specialty Comments:  No specialty comments available.  Imaging: No results found.   PMFS History: Patient Active Problem List   Diagnosis Date Noted  . Osteoarthritis of left knee 06/16/2015  . Status post total left knee replacement 06/16/2015  . MYALGIA 12/19/2009  . SUBSTANCE ABUSE 02/06/2009  . KNEE PAIN, LEFT 10/06/2008  . URI 07/11/2008  . ACUTE MAXILLARY SINUSITIS 03/17/2008  . ADD 09/14/2007  .  COLONIC POLYPS, HX OF 08/03/2007  . Essential hypertension 01/20/2007  . OBESITY NOS 12/04/2006  . Allergic rhinitis 12/04/2006  . GASTROJEJUNOSTOMY, HX OF 12/04/2006  . BREAST BIOPSY, HX OF 12/04/2006   Past Medical History:  Diagnosis Date  . ADD (attention deficit disorder)   . Allergy   . Arthritis 2016   osteoarthritis left knee  . Colon polyps   . Hypertension   . Left knee pain    chronic  . Narcotic addiction ([facility])   . Obesity    post gastric bypass  . Vitamin D deficiency     Family History  Problem Relation Age of Onset  . Heart disease Mother     post CABG history of CHF  . COPD Father   . Diabetes Sister   . Hypertension Sister     post renal transplant  . Heart disease Brother     CAD    Past Surgical History:  Procedure Laterality Date  . arthscopic knee surgery Left    several times  . BREAST SURGERY  years ago   breast biopsy  . CHOLECYSTECTOMY    . EYE SURGERY Bilateral 2013   Toric Lens implants  . EYE SURGERY Right 2014   corneal amniotic membrane  . GASTRIC BYPASS  2003  . KIDNEY DONATION  2004  . TOTAL KNEE ARTHROPLASTY Left 06/16/2015   Procedure: LEFT TOTAL KNEE ARTHROPLASTY;  Surgeon: Kathryne Hitchhristopher Y Blackman, MD;  Location: [facility];  Service: Orthopedics;  Laterality: Left;   Social History   Occupational History  . Not on file.   Social History Main Topics  . Smoking status: Former Smoker    Quit date: 07/22/1992  . Smokeless tobacco: Never Used  . Alcohol use Yes     Comment: once every 6 months  . Drug use: No  . Sexual activity: Not on file

## 2016-06-10 ENCOUNTER — Telehealth (INDEPENDENT_AMBULATORY_CARE_PROVIDER_SITE_OTHER): Payer: Self-pay | Admitting: Orthopaedic Surgery

## 2016-06-10 MED ORDER — HYDROCODONE-ACETAMINOPHEN 5-325 MG PO TABS: 1.0000 | ORAL_TABLET | Freq: Four times a day (QID) | ORAL | 0 refills | Status: DC | PRN

## 2016-06-10 NOTE — Telephone Encounter (Signed)
Can come and pick up script 

## 2016-06-10 NOTE — Telephone Encounter (Signed)
Patient calling for Rx for Hydrocodone.  Was seen Thursday Nov 16th, since then increased pain L shoulder.  Please call pt when Rx can be picked up.

## 2016-06-10 NOTE — Telephone Encounter (Signed)
Please advise 

## 2016-06-10 NOTE — Telephone Encounter (Signed)
Pt aware Rx ready at front desk

## 2016-06-17 ENCOUNTER — Telehealth (INDEPENDENT_AMBULATORY_CARE_PROVIDER_SITE_OTHER): Payer: Self-pay | Admitting: Orthopaedic Surgery

## 2016-06-17 MED ORDER — HYDROCODONE-ACETAMINOPHEN 5-325 MG PO TABS: 1.0000 | ORAL_TABLET | Freq: Three times a day (TID) | ORAL | 0 refills | Status: DC | PRN

## 2016-06-17 NOTE — Telephone Encounter (Signed)
Rx refill Hydrocodine °

## 2016-06-17 NOTE — Telephone Encounter (Signed)
Please advise 

## 2016-06-17 NOTE — Telephone Encounter (Signed)
She can come pick up a script, but tell her that this needs to be the last one for a few weeks since she just got 60 pills a week ago.

## 2016-06-24 ENCOUNTER — Other Ambulatory Visit: Payer: Self-pay | Admitting: Internal Medicine

## 2016-06-24 NOTE — Telephone Encounter (Signed)
Patient is requesting an rx for tramadol  Pharmacy: [facility],  [address]   Cb#: [phone]

## 2016-06-25 MED ORDER — TRAMADOL HCL 50 MG PO TABS: 100.0000 mg | ORAL_TABLET | Freq: Four times a day (QID) | ORAL | 0 refills | Status: DC | PRN

## 2016-06-25 NOTE — Telephone Encounter (Signed)
Called Rx into pharmacy.  

## 2016-06-25 NOTE — Telephone Encounter (Signed)
Please call in tramadol 50 mg, 1-2 po every 6 hours as needed, #90.  I did put it in as an order.

## 2016-06-25 NOTE — Telephone Encounter (Signed)
Please advise 

## 2016-06-26 ENCOUNTER — Telehealth (INDEPENDENT_AMBULATORY_CARE_PROVIDER_SITE_OTHER): Payer: Self-pay

## 2016-06-26 ENCOUNTER — Encounter: Payer: Self-pay | Admitting: Physical Therapy

## 2016-06-26 ENCOUNTER — Ambulatory Visit: Payer: Medicare Other | Attending: Orthopaedic Surgery | Admitting: Physical Therapy

## 2016-06-26 DIAGNOSIS — M25512 Pain in left shoulder: Secondary | ICD-10-CM | POA: Insufficient documentation

## 2016-06-26 DIAGNOSIS — M25612 Stiffness of left shoulder, not elsewhere classified: Secondary | ICD-10-CM | POA: Diagnosis present

## 2016-06-26 DIAGNOSIS — M6281 Muscle weakness (generalized): Secondary | ICD-10-CM | POA: Diagnosis present

## 2016-06-26 NOTE — Telephone Encounter (Signed)
Please advise 

## 2016-06-26 NOTE — Therapy (Signed)
[facility] [address], [address] [place], [place], [address] Phone: [phone]   Fax:  [phone]  Physical Therapy Evaluation  Patient Details  Name: Leslie [name] MRN: [id number] Date of Birth: 1951-05-14 Referring Provider: Kathryne HitchBlackman, Christopher Y, MD  Encounter Date: 06/26/2016      PT End of Session - 06/26/16 1515    Visit Number 1   Number of Visits 10   Date for PT Re-Evaluation 08/21/16   Authorization Type mcare - gcodes at 10th visit   PT Start Time 1110   PT Stop Time 1145   PT Time Calculation (min) 35 min   Activity Tolerance Patient tolerated treatment well   Behavior [facility] Therapy [facility] for tasks assessed/performed      Past Medical History:  Diagnosis Date  . ADD (attention deficit disorder)   . Allergy   . Arthritis 2016   osteoarthritis left knee  . Colon polyps   . Hypertension   . Left knee pain    chronic  . Narcotic addiction (HCC)   . Obesity    post gastric bypass  . Vitamin D deficiency     Past Surgical History:  Procedure Laterality Date  . arthscopic knee surgery Left    several times  . BREAST SURGERY  years ago   breast biopsy  . CHOLECYSTECTOMY    . EYE SURGERY Bilateral 2013   Toric Lens implants  . EYE SURGERY Right 2014   corneal amniotic membrane  . GASTRIC BYPASS  2003  . KIDNEY DONATION  2004  . TOTAL KNEE ARTHROPLASTY Left 06/16/2015   Procedure: LEFT TOTAL KNEE ARTHROPLASTY;  Surgeon: Kathryne Hitchhristopher Y Blackman, MD;  Location: [facility];  Service: Orthopedics;  Laterality: Left;    There were no vitals filed for this visit.       Subjective Assessment - 06/26/16 1117    Subjective Pt is S/P shoulder RTC surgery Lt shoulder.  She states she has not had a bad experience as far as pain, but just wants to be able to return to normal function with her Lt arm.   Pertinent History shoulder sling, gentle ROM at this time, S/P RTC repair Lt UE   Limitations House  hold activities;Reading   Patient Stated Goals full ROM, going to [place] in 2 weeks, golf and bowling occasionally, able to use the Lt arm to take stress off the Rt arm   Currently in Pain? Yes   Pain Score 2    Pain Location Shoulder   Pain Orientation Left   Pain Descriptors / Indicators Discomfort;Throbbing   Pain Type Surgical pain   Pain Onset 1 to 4 weeks ago   Aggravating Factors  worse at night when sleeping   Pain Relieving Factors pain med and sleeping propped up   Effect of Pain on Daily Activities unable to reach and move   Multiple Pain Sites No            [facility] - 06/26/16 0001      Assessment   Medical Diagnosis M75.112   Referring Provider Kathryne HitchBlackman, Christopher Y, MD   Onset Date/Surgical Date 05/30/16   Hand Dominance Right   Next MD Visit 07/10/16   Prior Therapy no     Precautions   Precautions Shoulder   Type of Shoulder Precautions Wearing sling, gentle ROM only at this time   Shoulder Interventions Shoulder sling/immobilizer     Balance Screen   Has the patient fallen in the  past 6 months No   Has the patient had a decrease in activity level because of a fear of falling?  No   Is the patient reluctant to leave their home because of a fear of falling?  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   Leisure golf and bowl occasionally     Observation/Other Assessments   Focus on Therapeutic Outcomes (FOTO)  62% limitation  goal 37%     Posture/Postural Control   Posture/Postural Control Postural limitations   Postural Limitations Rounded Shoulders     PROM   Overall PROM Comments 70 deg flexion, 90 deg  abduction, 30 degrees external rotation, 60 deg internal rotation     Strength   Overall Strength Deficits   Overall Strength Comments Lt shoulder 2/5 all motions, 56 lb grip strength, forearm supination 4/5  due to s/p surgical status                             PT Short Term Goals - 06/26/16 1524      PT SHORT TERM GOAL #1   Title be independent in initial HEP   Time 4   Period Weeks   Status New     PT SHORT TERM GOAL #2   Title able to achieve 120 degrees of PROM flexion for improved functional reaching   Time 4   Period Weeks   Status New     PT SHORT TERM GOAL #3   Title ...     PT SHORT TERM GOAL #4   Title ...     PT SHORT TERM GOAL #5   Title ...           PT Long Term Goals - 06/26/16 1527      PT LONG TERM GOAL #1   Title be independent in advanced HEP   Time 8   Period Weeks   Status New     PT LONG TERM GOAL #2   Title reduce FOTO to < or = to 37% limitation   Time 8   Period Weeks   Status New     PT LONG TERM GOAL #3   Title able to lift 1 lb overhead with good scapular mobility for improved functional reaching during household tasks   Time 8   Period Weeks   Status New     PT LONG TERM GOAL #4   Title Lt shoulder flex and abduction strength 4+/5 without increased pain for improved functional reaching   Time 8   Period Weeks   Status New     PT LONG TERM GOAL #5   Title AROM Lt shoulder 160 degrees of flexion and abduction for improved functional reaching overhead   Time 8   Period Weeks   Status New     Additional Long Term Goals   Additional Long Term Goals Yes     PT LONG TERM GOAL #6   Title able to perform household chores without increased pain in Lt shoulder   Time 8   Period Weeks   Status New  Plan - 07/01/2016 1532    Clinical Impression Statement Patient had low complexity eval due to low comorbidities.  Pt is S/P Lt RTC repair.  She demonstrates muscle weakness of 2/5 Lt  shoulder due to unable to perform full AROM at this time post surgery.  PROM limited to <90 flexion and abduction, <30 degrees ER and 60 deg internal rotation both taken at 45 degrees abduction passively.  Pt does have a soft end feel for PROM, but unable to progress further due to percautions at this time.  Pt will needs skilled PT to regain strength and ROM and return to functional movements with good mechanics in order to avoid increased risk of future injuries.   Rehab Potential Excellent   Clinical Impairments Affecting Rehab Potential Lt shoulder RTC repair   PT Frequency 2x / week   PT Duration 8 weeks   PT Treatment/Interventions ADLs/Self Care Home Management;Electrical Stimulation;Cryotherapy;Moist Heat;Therapeutic activities;Therapeutic exercise;Neuromuscular re-education;Patient/family education;Manual techniques;Passive range of motion;Dry needling;Taping;Vasopneumatic Device   PT Next Visit Plan gentle PROM; PROM exercises table slides   PT Home Exercise Plan add flexion table slide as tolerated   Recommended Other Services none   Consulted and Agree with Plan of Care Patient      Patient will benefit from skilled therapeutic intervention in order to improve the following deficits and impairments:  Decreased activity tolerance, Decreased strength, Decreased range of motion, Increased fascial restricitons, Impaired UE functional use, Postural dysfunction, Increased edema  Visit Diagnosis: Acute pain of left shoulder - Plan: PT plan of care cert/re-cert  Stiffness of left shoulder, not elsewhere classified - Plan: PT plan of care cert/re-cert  Muscle weakness (generalized) - Plan: PT plan of care cert/re-cert      G-Codes - 07/01/2016 1520    Functional Assessment Tool Used FOTO and clinical impression - 62% limitation  goal 37%   Functional Limitation Carrying, moving and handling objects   Carrying, Moving and Handling Objects Current Status (W0981) At least 60 percent but  less than 80 percent impaired, limited or restricted   Carrying, Moving and Handling Objects Goal Status (X9147) At least 20 percent but less than 40 percent impaired, limited or restricted       Problem List Patient Active Problem List   Diagnosis Date Noted  . Osteoarthritis of left knee 06/16/2015  . Status post total left knee replacement 06/16/2015  . MYALGIA 12/19/2009  . SUBSTANCE ABUSE 02/06/2009  . KNEE PAIN, LEFT 10/06/2008  . URI 07/11/2008  . ACUTE MAXILLARY SINUSITIS 03/17/2008  . ADD 09/14/2007  . COLONIC POLYPS, HX OF 08/03/2007  . Essential hypertension 01/20/2007  . OBESITY NOS 12/04/2006  . Allergic rhinitis 12/04/2006  . GASTROJEJUNOSTOMY, HX OF 12/04/2006  . BREAST BIOPSY, HX OF 12/04/2006    [name], PT 07-01-2016, 5:10 PM  [facility] [address], [address] [place], [place], [address] Phone: [phone]   Fax:  [phone]  Name: Leslie [name] MRN: [id number] Date of Birth: 04/24/1951

## 2016-06-26 NOTE — Telephone Encounter (Signed)
[facility] PT  called  and states patient has appt this AM for PT  and states patient lost PT script and is needing a new faxed to him ASAP FAX Number: [phone]

## 2016-06-28 ENCOUNTER — Other Ambulatory Visit: Payer: Self-pay | Admitting: Internal Medicine

## 2016-07-02 ENCOUNTER — Encounter: Payer: Self-pay | Admitting: Physical Therapy

## 2016-07-02 ENCOUNTER — Ambulatory Visit: Payer: Medicare Other | Admitting: Physical Therapy

## 2016-07-02 DIAGNOSIS — M25512 Pain in left shoulder: Secondary | ICD-10-CM

## 2016-07-02 DIAGNOSIS — M6281 Muscle weakness (generalized): Secondary | ICD-10-CM

## 2016-07-02 DIAGNOSIS — M25612 Stiffness of left shoulder, not elsewhere classified: Secondary | ICD-10-CM

## 2016-07-02 NOTE — Patient Instructions (Addendum)
Squeeze tennis ball 10 every 2 hours.  AROM: Wrist Flexion / Extension    Actively bend right wrist forward then back as far as possible. Repeat __10__ times per set. Do _1___ sets per session. Do __3__ sessions per day. Only do while in sling. Copyright  VHI. All rights reserved.    AROM: Wrist Radial / Ulnar Deviation    Gently bend left wrist from side to side as far as possible. Repeat _10___ times per set. Do _1___ sets per session. Do 3____ sessions per day. Do while in sling.  Copyright  VHI. All rights reserved.  Towel Roll Squeeze    With right forearm resting on surface, gently squeeze towel or tennis ball. Repeat _10___ times per set. Do __1__ sets per session. Do __3__ sessions per day.  Copyright  VHI. All rights reserved.  [facility] [address], [address] [place], [place] [address] Phone # [phone] Fax [phone]

## 2016-07-02 NOTE — Therapy (Signed)
[facility] [address] [place], [place], [address] Phone: [phone]   Fax:  [phone]  Physical Therapy Treatment  Patient Details  Name: Leslie [name] MRN: [id number] Date of Birth: 1950-10-13 Referring Provider: Mcarthur Rossetti, MD  Encounter Date: 07/02/2016      PT End of Session - 07/02/16 1053    Visit Number 2   Number of Visits 10   Date for PT Re-Evaluation 08/21/16   Authorization Type mcare - gcodes at 10th visit   PT Start Time 1019   PT Stop Time 1057   PT Time Calculation (min) 38 min   Activity Tolerance Patient tolerated treatment well   Behavior During Therapy [facility] for tasks assessed/performed      Past Medical History:  Diagnosis Date  . ADD (attention deficit disorder)   . Allergy   . Arthritis 2016   osteoarthritis left knee  . Colon polyps   . Hypertension   . Left knee pain    chronic  . Narcotic addiction ([place])   . Obesity    post gastric bypass  . Vitamin D deficiency     Past Surgical History:  Procedure Laterality Date  . arthscopic knee surgery Left    several times  . BREAST SURGERY  years ago   breast biopsy  . CHOLECYSTECTOMY    . EYE SURGERY Bilateral 2013   Toric Lens implants  . EYE SURGERY Right 2014   corneal amniotic membrane  . GASTRIC BYPASS  2003  . KIDNEY DONATION  2004  . TOTAL KNEE ARTHROPLASTY Left 06/16/2015   Procedure: LEFT TOTAL KNEE ARTHROPLASTY;  Surgeon: Mcarthur Rossetti, MD;  Location: [facility];  Service: Orthopedics;  Laterality: Left;    There were no vitals filed for this visit.      Subjective Assessment - 07/02/16 1024    Subjective My shoulder feels fine if I do not move my shoulder. I see MD on 07/10/2016.    Pertinent History shoulder sling, gentle ROM at this time, S/P RTC repair Lt UE   Limitations House hold activities;Reading   Patient Stated Goals full ROM, going to [place] in 2 weeks, golf and bowling  occasionally, able to use the Lt arm to take stress off the Rt arm   Currently in Pain? Yes   Pain Score 2    Pain Location Shoulder   Pain Orientation Left   Pain Descriptors / Indicators Discomfort;Throbbing   Pain Type Surgical pain   Pain Onset 1 to 4 weeks ago   Aggravating Factors  worse at night when sleeping   Pain Relieving Factors pain med and sleeping propped up   Effect of Pain on Daily Activities unable to reach and move   Multiple Pain Sites No                         [facility] Adult PT Treatment/Exercise - 07/02/16 0001      Manual Therapy   Manual Therapy Soft tissue mobilization;Passive ROM   Passive ROM left shoulder gently for flexion, abduction, ER/IR at patient side, left elbow flexion/extension, and wrist movement with arm at side and propped  in supine, not above 90 degrees                PT Education - 07/02/16 1050    Education provided Yes   Education Details wrist AROM in sling, squeeze tennis ball   Person(s) Educated Patient  Methods Explanation;Demonstration;Verbal cues;Handout   Comprehension Returned demonstration;Verbalized understanding          PT Short Term Goals - 07/02/16 1056      PT SHORT TERM GOAL #1   Title be independent in initial HEP   Time 4   Period Weeks   Status On-going     PT SHORT TERM GOAL #2   Title able to achieve 120 degrees of PROM flexion for improved functional reaching   Time 4   Period Weeks   Status On-going           PT Long Term Goals - 06/26/16 1527      PT LONG TERM GOAL #1   Title be independent in advanced HEP   Time 8   Period Weeks   Status New     PT LONG TERM GOAL #2   Title reduce FOTO to < or = to 37% limitation   Time 8   Period Weeks   Status New     PT LONG TERM GOAL #3   Title able to lift 1 lb overhead with good scapular mobility for improved functional reaching during household tasks   Time 8   Period Weeks   Status New     PT LONG TERM GOAL #4    Title Lt shoulder flex and abduction strength 4+/5 without increased pain for improved functional reaching   Time 8   Period Weeks   Status New     PT LONG TERM GOAL #5   Title AROM Lt shoulder 160 degrees of flexion and abduction for improved functional reaching overhead   Time 8   Period Weeks   Status New     Additional Long Term Goals   Additional Long Term Goals Yes     PT LONG TERM GOAL #6   Title able to perform household chores without increased pain in Lt shoulder   Time 8   Period Weeks   Status New               Plan - 07/02/16 1054    Clinical Impression Statement Patient MD wants her to go slowly with PROM to left shoulder.  Patient is to stay in the sling at all times.  Patient is reminded that she is not to use her left arm.  Patient needed verbal cues to relax the left shoulder with PROM.  Patient has not met goals yet due to just starting therapy.  Patient will benefit from skilled therapy to improve left shoulder ROM and strength as MD progress patient.    Rehab Potential Excellent   Clinical Impairments Affecting Rehab Potential Lt shoulder RTC repair   PT Frequency 2x / week   PT Duration 8 weeks   PT Treatment/Interventions ADLs/Self Care Home Management;Electrical Stimulation;Cryotherapy;Moist Heat;Therapeutic activities;Therapeutic exercise;Neuromuscular re-education;Patient/family education;Manual techniques;Passive range of motion;Dry needling;Taping;Vasopneumatic Device   PT Next Visit Plan gentle PROM; PROM exercises table slides; patient is seeing MD on 07/10/2016    PT Home Exercise Plan add flexion table slide as tolerated   Consulted and Agree with Plan of Care Patient      Patient will benefit from skilled therapeutic intervention in order to improve the following deficits and impairments:  Decreased activity tolerance, Decreased strength, Decreased range of motion, Increased fascial restricitons, Impaired UE functional use, Postural  dysfunction, Increased edema  Visit Diagnosis: Acute pain of left shoulder  Stiffness of left shoulder, not elsewhere classified  Muscle weakness (generalized)     Problem  List Patient Active Problem List   Diagnosis Date Noted  . Osteoarthritis of left knee 06/16/2015  . Status post total left knee replacement 06/16/2015  . MYALGIA 12/19/2009  . SUBSTANCE ABUSE 02/06/2009  . KNEE PAIN, LEFT 10/06/2008  . URI 07/11/2008  . ACUTE MAXILLARY SINUSITIS 03/17/2008  . ADD 09/14/2007  . COLONIC POLYPS, HX OF 08/03/2007  . Essential hypertension 01/20/2007  . OBESITY NOS 12/04/2006  . Allergic rhinitis 12/04/2006  . GASTROJEJUNOSTOMY, HX OF 12/04/2006  . BREAST BIOPSY, HX OF 12/04/2006    Earlie Counts, PT 07/02/16 10:57 AM   [facility] [address] [place], [place], [address] Phone: [phone]   Fax:  [phone]  Name: Leslie [name] MRN: [id number] Date of Birth: 1951-06-13

## 2016-07-04 ENCOUNTER — Telehealth (INDEPENDENT_AMBULATORY_CARE_PROVIDER_SITE_OTHER): Payer: Self-pay | Admitting: Orthopaedic Surgery

## 2016-07-04 ENCOUNTER — Encounter: Payer: Medicare Other | Admitting: Physical Therapy

## 2016-07-04 MED ORDER — TRAMADOL HCL 50 MG PO TABS: 100.0000 mg | ORAL_TABLET | Freq: Four times a day (QID) | ORAL | 0 refills | Status: DC | PRN

## 2016-07-04 NOTE — Telephone Encounter (Signed)
Ok to call more in.  I did send some in, but you will need to call it in...thanks

## 2016-07-04 NOTE — Telephone Encounter (Signed)
Please advise 

## 2016-07-05 NOTE — Telephone Encounter (Signed)
Called into pharmacy

## 2016-07-08 ENCOUNTER — Ambulatory Visit: Payer: Medicare Other | Admitting: Physical Therapy

## 2016-07-08 ENCOUNTER — Encounter: Payer: Self-pay | Admitting: Physical Therapy

## 2016-07-08 DIAGNOSIS — M6281 Muscle weakness (generalized): Secondary | ICD-10-CM

## 2016-07-08 DIAGNOSIS — M25612 Stiffness of left shoulder, not elsewhere classified: Secondary | ICD-10-CM

## 2016-07-08 DIAGNOSIS — M25512 Pain in left shoulder: Secondary | ICD-10-CM | POA: Diagnosis not present

## 2016-07-08 NOTE — Therapy (Signed)
[facility] [address], [address] [place], [place], [address] Phone: [phone]   Fax:  [phone]  Physical Therapy Treatment  Patient Details  Name: Leslie [name] MRN: [id number] Date of Birth: 1951/01/02 Referring Provider: Kathryne HitchBlackman, Christopher Y, MD  Encounter Date: 07/08/2016      PT End of Session - 07/08/16 0852    Visit Number 3   Number of Visits 10   Date for PT Re-Evaluation 08/21/16   Authorization Type mcare - gcodes at 10th visit   PT Start Time 0851  10 min late   PT Stop Time 0940   PT Time Calculation (min) 49 min   Activity Tolerance Patient tolerated treatment well   Behavior During Therapy [facility] for tasks assessed/performed      Past Medical History:  Diagnosis Date  . ADD (attention deficit disorder)   . Allergy   . Arthritis 2016   osteoarthritis left knee  . Colon polyps   . Hypertension   . Left knee pain    chronic  . Narcotic addiction (HCC)   . Obesity    post gastric bypass  . Vitamin D deficiency     Past Surgical History:  Procedure Laterality Date  . arthscopic knee surgery Left    several times  . BREAST SURGERY  years ago   breast biopsy  . CHOLECYSTECTOMY    . EYE SURGERY Bilateral 2013   Toric Lens implants  . EYE SURGERY Right 2014   corneal amniotic membrane  . GASTRIC BYPASS  2003  . KIDNEY DONATION  2004  . TOTAL KNEE ARTHROPLASTY Left 06/16/2015   Procedure: LEFT TOTAL KNEE ARTHROPLASTY;  Surgeon: Kathryne Hitchhristopher Y Blackman, MD;  [facility];  Service: Orthopedics;  Laterality: Left;    There were no vitals filed for this visit.      Subjective Assessment - 07/08/16 0852    Subjective I stumbled this weekend and my body is sore from that. No new complaints regarding shoulder.    Currently in Pain? --  Upper LT arm is sore this AM.    Multiple Pain Sites No            [facility] PT Assessment - 07/08/16 0001      PROM   Overall PROM Comments  Supine flexion 115 degrees                     [facility] Adult PT Treatment/Exercise - 07/08/16 0001      Shoulder Exercises: Supine   Flexion --  Supine, AAROM flexion with VC to straighten elbox 2x10     Shoulder Exercises: Seated   Other Seated Exercises UE ranger all planes 20x each  Pain free ROM.      Shoulder Exercises: Pulleys   Flexion 2 minutes  VC to relax UE/neck     Moist Heat Therapy   Number Minutes Moist Heat 15 Minutes   Moist Heat Location Shoulder   Ant/post post tx     Manual Therapy   Passive ROM left shoulder gently for flexion, abduction, ER/IR at patient side, left elbow flexion/extension, and wrist movement with arm at side and propped  in supine, not above 90 degrees                  PT Short Term Goals - 07/08/16 0859      PT SHORT TERM GOAL #1   Title be independent in initial HEP   Time 4  Period Weeks   Status Achieved           PT Long Term Goals - 06/26/16 1527      PT LONG TERM GOAL #1   Title be independent in advanced HEP   Time 8   Period Weeks   Status New     PT LONG TERM GOAL #2   Title reduce FOTO to < or = to 37% limitation   Time 8   Period Weeks   Status New     PT LONG TERM GOAL #3   Title able to lift 1 lb overhead with good scapular mobility for improved functional reaching during household tasks   Time 8   Period Weeks   Status New     PT LONG TERM GOAL #4   Title Lt shoulder flex and abduction strength 4+/5 without increased pain for improved functional reaching   Time 8   Period Weeks   Status New     PT LONG TERM GOAL #5   Title AROM Lt shoulder 160 degrees of flexion and abduction for improved functional reaching overhead   Time 8   Period Weeks   Status New     Additional Long Term Goals   Additional Long Term Goals Yes     PT LONG TERM GOAL #6   Title able to perform household chores without increased pain in Lt shoulder   Time 8   Period Weeks   Status New                Plan - 07/08/16 [id number]    Clinical Impression Statement Pt's PROM in flexion has significantly improved since eval. No pain essentially, pt continues to be in the sling but will see MD on Wednesday. No pain with any exercises today.    Clinical Impairments Affecting Rehab Potential Lt shoulder RTC repair   PT Frequency 2x / week   PT Duration 8 weeks   PT Treatment/Interventions ADLs/Self Care Home Management;Electrical Stimulation;Cryotherapy;Moist Heat;Therapeutic activities;Therapeutic exercise;Neuromuscular re-education;Patient/family education;Manual techniques;Passive range of motion;Dry needling;Taping;Vasopneumatic Device   PT Next Visit Plan gentle PROM; PROM exercises table slides; patient is seeing MD on 07/10/2016    Consulted and Agree with Plan of Care Patient      Patient will benefit from skilled therapeutic intervention in order to improve the following deficits and impairments:  Decreased activity tolerance, Decreased strength, Decreased range of motion, Increased fascial restricitons, Impaired UE functional use, Postural dysfunction, Increased edema  Visit Diagnosis: Acute pain of left shoulder  Stiffness of left shoulder, not elsewhere classified  Muscle weakness (generalized)     Problem List Patient Active Problem List   Diagnosis Date Noted  . Osteoarthritis of left knee 06/16/2015  . Status post total left knee replacement 06/16/2015  . MYALGIA 12/19/2009  . SUBSTANCE ABUSE 02/06/2009  . KNEE PAIN, LEFT 10/06/2008  . URI 07/11/2008  . ACUTE MAXILLARY SINUSITIS 03/17/2008  . ADD 09/14/2007  . COLONIC POLYPS, HX OF 08/03/2007  . Essential hypertension 01/20/2007  . OBESITY NOS 12/04/2006  . Allergic rhinitis 12/04/2006  . GASTROJEJUNOSTOMY, HX OF 12/04/2006  . BREAST BIOPSY, HX OF 12/04/2006    Chelbie Jarnagin, PTA 07/08/2016, 9:28 AM  [facility] [address], [address] [place], [place], [address] Phone: [phone]   Fax:  [phone]  Name: Leslie [name] MRN: [id number] Date of Birth: 03-Oct-1950

## 2016-07-10 ENCOUNTER — Encounter (INDEPENDENT_AMBULATORY_CARE_PROVIDER_SITE_OTHER): Payer: Self-pay | Admitting: Orthopaedic Surgery

## 2016-07-10 ENCOUNTER — Ambulatory Visit (INDEPENDENT_AMBULATORY_CARE_PROVIDER_SITE_OTHER): Payer: Medicare Other | Admitting: Physician Assistant

## 2016-07-10 DIAGNOSIS — Z9889 Other specified postprocedural states: Secondary | ICD-10-CM

## 2016-07-10 NOTE — Progress Notes (Signed)
Office Visit Note   Patient: Leslie [name]           Date of Birth: October 15, 1950           MRN: [id number] Visit Date: 07/10/2016              Requested by: Gordy SaversPeter F Kwiatkowski, MD [address] [place], [place] [address] PCP: Rogelia BogaKWIATKOWSKI,PETER FRANK, MD   Assessment & Plan: Visit Diagnoses:  1. Hx of repair of left rotator cuff     Plan: Continue PT. She [facility] DC [facility]   Follow-Up Instructions: No Follow-up on file.   Orders:  No orders of the defined types were placed in this encounter.  No orders of the defined types were placed in this encounter.     Procedures: No procedures performed   Clinical Data: No additional findings.   Subjective: Chief Complaint  Patient presents with  . Left Shoulder - Follow-up    Left shoulder follow up rotator cuff repair. She is doing well, she is going to therapy twice a week. She is taking tramadol for her pain. She is very protective of arm. She is in sling. She is tumbled Saturday night but her arm was protected.     HPI States overall that the left shoulder is "getting there". Did have a fall as past Saturday but feels that she The shoulder protected. She's had no increase or change in the discomfort/pain she was having prior to the fall. He is working with physical therapy. Review of Systems   Objective: Vital Signs: There were no vitals taken for this visit.  Physical Exam  Ortho Exam Passively I can bring her shoulder to 110 forward flexion. Range of motion of the left elbow/ forearm without pain. Specialty Comments:  No specialty comments available.  Imaging: No results found.   PMFS History: Patient Active Problem List   Diagnosis Date Noted  . Osteoarthritis of left knee 06/16/2015  . Status post total left knee replacement 06/16/2015  . MYALGIA 12/19/2009  . SUBSTANCE ABUSE 02/06/2009  . KNEE PAIN, LEFT 10/06/2008  . URI 07/11/2008  . ACUTE MAXILLARY  SINUSITIS 03/17/2008  . ADD 09/14/2007  . COLONIC POLYPS, HX OF 08/03/2007  . Essential hypertension 01/20/2007  . OBESITY NOS 12/04/2006  . Allergic rhinitis 12/04/2006  . GASTROJEJUNOSTOMY, HX OF 12/04/2006  . BREAST BIOPSY, HX OF 12/04/2006   Past Medical History:  Diagnosis Date  . ADD (attention deficit disorder)   . Allergy   . Arthritis 2016   osteoarthritis left knee  . Colon polyps   . Hypertension   . Left knee pain    chronic  . Narcotic addiction (HCC)   . Obesity    post gastric bypass  . Vitamin D deficiency     Family History  Problem Relation Age of Onset  . Heart disease Mother     post CABG history of CHF  . COPD Father   . Diabetes Sister   . Hypertension Sister     post renal transplant  . Heart disease Brother     CAD    Past Surgical History:  Procedure Laterality Date  . arthscopic knee surgery Left    several times  . BREAST SURGERY  years ago   breast biopsy  . CHOLECYSTECTOMY    . EYE SURGERY Bilateral 2013   Toric Lens implants  . EYE SURGERY Right 2014   corneal amniotic membrane  . GASTRIC BYPASS  2003  . KIDNEY DONATION  2004  . TOTAL KNEE ARTHROPLASTY Left 06/16/2015   Procedure: LEFT TOTAL KNEE ARTHROPLASTY;  Surgeon: Kathryne Hitchhristopher Y Blackman, MD;  Location: [facility];  Service: Orthopedics;  Laterality: Left;   Social History   Occupational History  . Not on file.   Social History Main Topics  . Smoking status: Former Smoker    Quit date: 07/22/1992  . Smokeless tobacco: Never Used  . Alcohol use Yes     Comment: once every 6 months  . Drug use: No  . Sexual activity: Not on file

## 2016-07-11 ENCOUNTER — Encounter: Payer: Self-pay | Admitting: Physical Therapy

## 2016-07-11 ENCOUNTER — Ambulatory Visit: Payer: Medicare Other | Admitting: Physical Therapy

## 2016-07-11 DIAGNOSIS — M25512 Pain in left shoulder: Secondary | ICD-10-CM

## 2016-07-11 DIAGNOSIS — M25612 Stiffness of left shoulder, not elsewhere classified: Secondary | ICD-10-CM

## 2016-07-11 DIAGNOSIS — M6281 Muscle weakness (generalized): Secondary | ICD-10-CM

## 2016-07-11 NOTE — Therapy (Signed)
[facility] [address] [place], [place], [address] Phone: [phone]   Fax:  [phone]  Physical Therapy Treatment  Patient Details  Name: Leslie [name] MRN: [id number] Date of Birth: 1951-03-24 Referring Provider: Kathryne Hitch, MD  Encounter Date: 07/11/2016      PT End of Session - 07/11/16 1021    Visit Number 4   Number of Visits 10   Date for PT Re-Evaluation 08/21/16   Authorization Type mcare - gcodes at 10th visit   PT Start Time 1017   PT Stop Time 1110   PT Time Calculation (min) 53 min   Activity Tolerance Patient tolerated treatment well   Behavior During Therapy [facility] for tasks assessed/performed      Past Medical History:  Diagnosis Date  . ADD (attention deficit disorder)   . Allergy   . Arthritis 2016   osteoarthritis left knee  . Colon polyps   . Hypertension   . Left knee pain    chronic  . Narcotic addiction (HCC)   . Obesity    post gastric bypass  . Vitamin D deficiency     Past Surgical History:  Procedure Laterality Date  . arthscopic knee surgery Left    several times  . BREAST SURGERY  years ago   breast biopsy  . CHOLECYSTECTOMY    . EYE SURGERY Bilateral 2013   Toric Lens implants  . EYE SURGERY Right 2014   corneal amniotic membrane  . GASTRIC BYPASS  2003  . KIDNEY DONATION  2004  . TOTAL KNEE ARTHROPLASTY Left 06/16/2015   Procedure: LEFT TOTAL KNEE ARTHROPLASTY;  Surgeon: Kathryne Hitch, MD;  Location: [facility];  Service: Orthopedics;  Laterality: Left;    There were no vitals filed for this visit.      Subjective Assessment - 07/11/16 1022    Subjective States last night was the first night without the sling.  Sling was d/c'd by MD.  States her arm is getting tired easily and starts to feel sore when that happens   Pertinent History d/c'd sling, S/P   Limitations House hold activities;Reading   Currently in Pain? Yes   Pain  Score 3    Pain Location Shoulder   Pain Orientation Left   Pain Descriptors / Indicators Discomfort   Pain Type Surgical pain   Pain Onset More than a month ago   Aggravating Factors  worst at night, after moving   Pain Relieving Factors sleeping propped up, pain meds once in a while   Effect of Pain on Daily Activities unable to reach   Multiple Pain Sites No                         [facility] Adult PT Treatment/Exercise - 07/11/16 0001      Shoulder Exercises: Seated   Other Seated Exercises UE ranger all planes 20x each  Pain free ROM.      Shoulder Exercises: Pulleys   Flexion 3 minutes     Shoulder Exercises: Therapy Ball   ABduction 25 reps     Shoulder Exercises: Isometric Strengthening   Flexion 5X5"   Extension 5X5"   External Rotation 5X5"   Internal Rotation 5X5"   ABduction 5X5"     Moist Heat Therapy   Number Minutes Moist Heat 15 Minutes   Moist Heat Location Shoulder     Manual Therapy   Passive ROM left shoulder gently for  flexion, abduction, ER/IR at patient side, left elbow flexion/extension, and wrist movement with arm at side and propped                PT Education - 07/11/16 1040    Education provided Yes   Education Details shoulder isometric 50% contraction or to tolerance 5 ways   Person(s) Educated Patient   Methods Explanation;Demonstration;Handout   Comprehension Verbalized understanding;Returned demonstration          PT Short Term Goals - 07/08/16 0859      PT SHORT TERM GOAL #1   Title be independent in initial HEP   Time 4   Period Weeks   Status Achieved           PT Long Term Goals - 06/26/16 1527      PT LONG TERM GOAL #1   Title be independent in advanced HEP   Time 8   Period Weeks   Status New     PT LONG TERM GOAL #2   Title reduce FOTO to < or = to 37% limitation   Time 8   Period Weeks   Status New     PT LONG TERM GOAL #3   Title able to lift 1 lb overhead with good scapular  mobility for improved functional reaching during household tasks   Time 8   Period Weeks   Status New     PT LONG TERM GOAL #4   Title Lt shoulder flex and abduction strength 4+/5 without increased pain for improved functional reaching   Time 8   Period Weeks   Status New     PT LONG TERM GOAL #5   Title AROM Lt shoulder 160 degrees of flexion and abduction for improved functional reaching overhead   Time 8   Period Weeks   Status New     Additional Long Term Goals   Additional Long Term Goals Yes     PT LONG TERM GOAL #6   Title able to perform household chores without increased pain in Lt shoulder   Time 8   Period Weeks   Status New               Plan - 07/11/16 1127    Clinical Impression Statement Pt PROM good internal and external rotation, abduction is the most limited and painful causing patient to guard.  Pt tolerated treatment well and PROM done to tolerance.  Added isometrics, no pain and able to add to HEP.  Skilled PT needed for increased strength, ROM, and return to function.   Rehab Potential Excellent   Clinical Impairments Affecting Rehab Potential Lt shoulder RTC repair   PT Frequency 2x / week   PT Duration 8 weeks   PT Treatment/Interventions ADLs/Self Care Home Management;Electrical Stimulation;Cryotherapy;Moist Heat;Therapeutic activities;Therapeutic exercise;Neuromuscular re-education;Patient/family education;Manual techniques;Passive range of motion;Dry needling;Taping;Vasopneumatic Device   PT Next Visit Plan PROM, advance AAROM as tolerated, review isometric exercise   Consulted and Agree with Plan of Care Patient      Patient will benefit from skilled therapeutic intervention in order to improve the following deficits and impairments:  Decreased activity tolerance, Decreased strength, Decreased range of motion, Increased fascial restricitons, Impaired UE functional use, Postural dysfunction, Increased edema  Visit Diagnosis: Acute pain of  left shoulder  Stiffness of left shoulder, not elsewhere classified  Muscle weakness (generalized)     Problem List Patient Active Problem List   Diagnosis Date Noted  . Osteoarthritis of left knee 06/16/2015  .  Status post total left knee replacement 06/16/2015  . MYALGIA 12/19/2009  . SUBSTANCE ABUSE 02/06/2009  . KNEE PAIN, LEFT 10/06/2008  . URI 07/11/2008  . ACUTE MAXILLARY SINUSITIS 03/17/2008  . ADD 09/14/2007  . COLONIC POLYPS, HX OF 08/03/2007  . Essential hypertension 01/20/2007  . OBESITY NOS 12/04/2006  . Allergic rhinitis 12/04/2006  . GASTROJEJUNOSTOMY, HX OF 12/04/2006  . BREAST BIOPSY, HX OF 12/04/2006    Vincente PoliJakki Crosser, PT 07/11/2016, 11:44 AM  [facility] [address] [place], [place], [address] Phone: [phone]   Fax:  [phone]  Name: Caro HightKaylene W Stites MRN: [id number] Date of Birth: [birth year]

## 2016-07-11 NOTE — Patient Instructions (Signed)
Strengthening: Isometric Abduction    Using wall for resistance, press left arm into ball using light pressure. Hold __5__ seconds. Repeat __5__ times per set. Do __1__ sets per session. Do __1-2__ sessions per day.  http://orth.exer.us/807   Copyright  VHI. All rights reserved.  Extension (Isometric)    Place left bent elbow and back of arm against wall. Press elbow against wall. Hold __5__ seconds. Repeat __5__ times. Do __1-2__ sessions per day.  http://gt2.exer.us/112   Copyright  VHI. All rights reserved.  Flexion (Isometric)    Press right fist against wall. Hold __5__ seconds. Repeat __5__ times. Do __1-2__ sessions per day.  http://gt2.exer.us/114   Copyright  VHI. All rights reserved.  External Rotation (Isometric)    Place back of left fist against door frame, with elbow bent. Press fist against door frame. Hold _5___ seconds. Repeat __5__ times. Do _1-2___ sessions per day.  http://gt2.exer.us/110   Copyright  VHI. All rights reserved.  Strengthening: Isometric Internal Rotation    Using door frame for resistance, press palm of right hand into ball using light pressure. Keep elbow in at side. Hold ____ seconds. Repeat ____ times per set. Do ____ sets per session. Do ____ sessions per day.  http://orth.exer.us/817   Copyright  VHI. All rights reserved.    [facility] [address], [address] [place], [place] [address] Phone # [phone] Fax [phone]  Vincente PoliJakki Crosser, PT 07/11/16 10:36 AM

## 2016-07-23 ENCOUNTER — Other Ambulatory Visit: Payer: Medicare Other

## 2016-07-23 ENCOUNTER — Encounter: Payer: Medicare Other | Admitting: Physical Therapy

## 2016-07-24 ENCOUNTER — Telehealth (INDEPENDENT_AMBULATORY_CARE_PROVIDER_SITE_OTHER): Payer: Self-pay | Admitting: Orthopaedic Surgery

## 2016-07-24 MED ORDER — TRAMADOL HCL 50 MG PO TABS: 100.0000 mg | ORAL_TABLET | Freq: Three times a day (TID) | ORAL | 0 refills | Status: DC | PRN

## 2016-07-24 NOTE — Telephone Encounter (Signed)
See script to call in...thanks

## 2016-07-24 NOTE — Telephone Encounter (Signed)
Pt requesting refill of tramadol. She's completely out.  [facility] on [address].  Pt number is [phone]

## 2016-07-24 NOTE — Telephone Encounter (Signed)
Please advise 

## 2016-07-25 ENCOUNTER — Encounter: Payer: Medicare Other | Admitting: Physical Therapy

## 2016-07-25 NOTE — Telephone Encounter (Signed)
Called into pharmacy

## 2016-07-26 ENCOUNTER — Encounter: Payer: Self-pay | Admitting: Internal Medicine

## 2016-07-26 ENCOUNTER — Ambulatory Visit (INDEPENDENT_AMBULATORY_CARE_PROVIDER_SITE_OTHER): Payer: Medicare Other | Admitting: Internal Medicine

## 2016-07-26 VITALS — BP 132/78 | HR 72 | Temp 97.8°F | Ht 65.0 in | Wt 206.8 lb

## 2016-07-26 DIAGNOSIS — I10 Essential (primary) hypertension: Secondary | ICD-10-CM

## 2016-07-26 DIAGNOSIS — R300 Dysuria: Secondary | ICD-10-CM | POA: Diagnosis not present

## 2016-07-26 LAB — POC URINALSYSI DIPSTICK (AUTOMATED)
Bilirubin, UA: NEGATIVE
Blood, UA: NEGATIVE
GLUCOSE UA: NEGATIVE
Ketones, UA: NEGATIVE
Leukocytes, UA: NEGATIVE
NITRITE UA: NEGATIVE
UROBILINOGEN UA: 0.2
pH, UA: 6

## 2016-07-26 MED ORDER — HYDROCODONE-HOMATROPINE 5-1.5 MG/5ML PO SYRP: 5.0000 mL | ORAL_SOLUTION | Freq: Four times a day (QID) | ORAL | 0 refills | Status: DC | PRN

## 2016-07-26 NOTE — Progress Notes (Signed)
Pre visit review using our clinic review tool, if applicable. No additional management support is needed unless otherwise documented below in the visit note. 

## 2016-07-26 NOTE — Patient Instructions (Signed)
Acute bronchitis symptoms for less than 10 days are generally not helped by antibiotics.  Take over-the-counter expectorants and cough medications such as  Mucinex DM.  Call if there is no improvement in 5 to 7 days or if  you develop worsening cough, fever, or new symptoms, such as shortness of breath or chest pain.   

## 2016-07-26 NOTE — Progress Notes (Signed)
Subjective:    Patient ID: Leslie [name], female    DOB: September 21, 1950, 66 y.o.   MRN: [id number]  HPI  66 year old patient who presents with a several day history of head and chest congestion, cough and fatigue.  There is been no documented fever.  She has a history of essential hypertension.  She is scheduled for a physical next week  Past Medical History:  Diagnosis Date  . ADD (attention deficit disorder)   . Allergy   . Arthritis 2016   osteoarthritis left knee  . Colon polyps   . Hypertension   . Left knee pain    chronic  . Narcotic addiction (HCC)   . Obesity    post gastric bypass  . Vitamin D deficiency      Social History   Social History  . Marital status: Single    Spouse name: N/A  . Number of children: N/A  . Years of education: N/A   Occupational History  . Not on file.   Social History Main Topics  . Smoking status: Former Smoker    Quit date: 07/22/1992  . Smokeless tobacco: Never Used  . Alcohol use Yes     Comment: once every 6 months  . Drug use: No  . Sexual activity: Not on file   Other Topics Concern  . Not on file   Social History Narrative  . No narrative on file    Past Surgical History:  Procedure Laterality Date  . arthscopic knee surgery Left    several times  . BREAST SURGERY  years ago   breast biopsy  . CHOLECYSTECTOMY    . EYE SURGERY Bilateral 2013   Toric Lens implants  . EYE SURGERY Right 2014   corneal amniotic membrane  . GASTRIC BYPASS  2003  . KIDNEY DONATION  2004  . TOTAL KNEE ARTHROPLASTY Left 06/16/2015   Procedure: LEFT TOTAL KNEE ARTHROPLASTY;  Surgeon: Kathryne Hitchhristopher Y Blackman, MD;  Location: [facility];  Service: Orthopedics;  Laterality: Left;    Family History  Problem Relation Age of Onset  . Heart disease Mother     post CABG history of CHF  . COPD Father   . Diabetes Sister   . Hypertension Sister     post renal transplant  . Heart disease Brother     CAD    Allergies  Allergen Reactions   . Sulfamethoxazole Nausea And Vomiting    See patient list of med intolerances due to h/o gastric bypass and nephrectomy    Current Outpatient Prescriptions on File Prior to Visit  Medication Sig Dispense Refill  . amphetamine-dextroamphetamine (ADDERALL XR) 30 MG 24 hr capsule Take 1 capsule (30 mg total) by mouth every morning. 30 capsule 0  . amphetamine-dextroamphetamine (ADDERALL XR) 30 MG 24 hr capsule Take 1 capsule (30 mg total) by mouth every morning. 30 capsule 0  . amphetamine-dextroamphetamine (ADDERALL XR) 30 MG 24 hr capsule Take 1 capsule (30 mg total) by mouth every morning. 30 capsule 0  . amphetamine-dextroamphetamine (ADDERALL) 20 MG tablet Take 1/2 to 1 tablet by mouth every evening if needed. 30 tablet 0  . amphetamine-dextroamphetamine (ADDERALL) 20 MG tablet Take 1/2 to 1 tablet by mouth every evening if needed. 30 tablet 0  . amphetamine-dextroamphetamine (ADDERALL) 20 MG tablet take 1/2 to 1 tablet by mouth every evening if needed 30 tablet 0  . Ascorbic Acid (VITAMIN C) 1000 MG tablet Take 1,000 mg by mouth daily.     .Marland Kitchen  calcium-vitamin D (SM CALCIUM 500/VITAMIN D3) 500-400 MG-UNIT tablet Take 1 tablet by mouth daily.     Marland Kitchen FLUoxetine (PROZAC) 40 MG capsule take 1 capsule by mouth once daily 90 capsule 1  . gabapentin (NEURONTIN) 300 MG capsule take 1 capsule by mouth once daily 90 capsule 1  . hydrochlorothiazide (HYDRODIURIL) 25 MG tablet take 1 tablet by mouth once daily 90 tablet 3  . hydroxypropyl methylcellulose / hypromellose (ISOPTO TEARS / GONIOVISC) 2.5 % ophthalmic solution Place 1 drop into both eyes 3 (three) times daily as needed for dry eyes.    Marland Kitchen lamoTRIgine (LAMICTAL) 100 MG tablet Take 1 tablet (100 mg total) by mouth 2 (two) times daily. 60 tablet 5  . triamcinolone (NASACORT) 55 MCG/ACT nasal inhaler Place 2 sprays into the nose daily. (Patient taking differently: Place 2 sprays into the nose daily as needed (allergies). ) 1 Inhaler 6  . zolpidem  (AMBIEN) 10 MG tablet take 1 tablet by mouth at bedtime for sleep 30 tablet 4   No current facility-administered medications on file prior to visit.     BP 132/78 (BP Location: Right Arm, Patient Position: Sitting, Cuff Size: Normal)   Pulse 72   Temp 97.8 F (36.6 C) (Oral)   Ht 5\' 5"  (1.651 m)   Wt 206 lb 12.8 oz (93.8 kg)   SpO2 93%   BMI 34.41 kg/m     Review of Systems  Constitutional: Positive for activity change, appetite change and fatigue.  HENT: Positive for congestion, postnasal drip and rhinorrhea. Negative for dental problem, hearing loss, sinus pressure, sore throat and tinnitus.   Eyes: Negative for pain, discharge and visual disturbance.  Respiratory: Positive for cough. Negative for shortness of breath.   Cardiovascular: Negative for chest pain, palpitations and leg swelling.  Gastrointestinal: Negative for abdominal distention, abdominal pain, blood in stool, constipation, diarrhea, nausea and vomiting.  Genitourinary: Negative for difficulty urinating, dysuria, flank pain, frequency, hematuria, pelvic pain, urgency, vaginal bleeding, vaginal discharge and vaginal pain.  Musculoskeletal: Negative for arthralgias, gait problem and joint swelling.  Skin: Negative for rash.  Neurological: Negative for dizziness, syncope, speech difficulty, weakness, numbness and headaches.  Hematological: Negative for adenopathy.  Psychiatric/Behavioral: Negative for agitation, behavioral problems and dysphoric mood. The patient is not nervous/anxious.        Objective:   Physical Exam  Constitutional: She is oriented to person, place, and time. She appears well-developed and well-nourished. No distress.  HENT:  Head: Normocephalic.  Right Ear: External ear normal.  Left Ear: External ear normal.  Mouth/Throat: Oropharynx is clear and moist.  Eyes: Conjunctivae and EOM are normal. Pupils are equal, round, and reactive to light.  Neck: Normal range of motion. Neck supple. No  thyromegaly present.  Cardiovascular: Normal rate, regular rhythm, normal heart sounds and intact distal pulses.   Pulmonary/Chest: Effort normal and breath sounds normal. No respiratory distress. She has no wheezes. She has no rales.  Abdominal: Soft. Bowel sounds are normal. She exhibits no mass. There is no tenderness.  Musculoskeletal: Normal range of motion.  Lymphadenopathy:    She has no cervical adenopathy.  Neurological: She is alert and oriented to person, place, and time.  Skin: Skin is warm and dry. No rash noted.  Psychiatric: She has a normal mood and affect. Her behavior is normal.          Assessment & Plan:   Viral URI with cough.  Will treat symptomatically Essential hypertension ADHD  CPX as scheduled  Nyoka Cowden

## 2016-07-29 ENCOUNTER — Encounter: Payer: BC Managed Care – PPO | Admitting: Internal Medicine

## 2016-07-30 ENCOUNTER — Encounter: Payer: Medicare Other | Admitting: Physical Therapy

## 2016-07-30 ENCOUNTER — Telehealth: Payer: Self-pay | Admitting: Internal Medicine

## 2016-07-30 NOTE — Telephone Encounter (Signed)
Pt request refill  HYDROcodone-homatropine (HYCODAN) 5-1.5 MG/5ML syrup

## 2016-07-30 NOTE — Telephone Encounter (Signed)
Spoke with pt, informed her that not further refills are allowed. Suggested Mucinex DM Pt verbalized understanding.

## 2016-07-30 NOTE — Telephone Encounter (Signed)
No further refills Suggest Mucinex DM

## 2016-08-01 ENCOUNTER — Encounter: Payer: Self-pay | Admitting: Physical Therapy

## 2016-08-01 ENCOUNTER — Ambulatory Visit: Payer: Medicare Other | Attending: Orthopaedic Surgery | Admitting: Physical Therapy

## 2016-08-01 DIAGNOSIS — M25612 Stiffness of left shoulder, not elsewhere classified: Secondary | ICD-10-CM | POA: Diagnosis present

## 2016-08-01 DIAGNOSIS — M25512 Pain in left shoulder: Secondary | ICD-10-CM | POA: Diagnosis not present

## 2016-08-01 DIAGNOSIS — M6281 Muscle weakness (generalized): Secondary | ICD-10-CM | POA: Insufficient documentation

## 2016-08-01 NOTE — Therapy (Signed)
[facility] [address], [address] [place], [place], [address] Phone: [phone]   Fax:  [phone]  Physical Therapy Treatment  Patient Details  Name: Leslie [name] MRN: [id number] Date of Birth: [birth year] Referring Provider: Kathryne HitchBlackman, Christopher Y, MD  Encounter Date: 08/01/2016      PT End of Session - 08/01/16 0934    Visit Number 5   Number of Visits 10   Date for PT Re-Evaluation 08/21/16   Authorization Type mcare - gcodes at 10th visit   PT Start Time 0930   PT Stop Time 1027   PT Time Calculation (min) 57 min   Activity Tolerance Patient tolerated treatment well   Behavior During Therapy [facility] for tasks assessed/performed      Past Medical History:  Diagnosis Date  . ADD (attention deficit disorder)   . Allergy   . Arthritis 2016   osteoarthritis left knee  . Colon polyps   . Hypertension   . Left knee pain    chronic  . Narcotic addiction (HCC)   . Obesity    post gastric bypass  . Vitamin D deficiency     Past Surgical History:  Procedure Laterality Date  . arthscopic knee surgery Left    several times  . BREAST SURGERY  years ago   breast biopsy  . CHOLECYSTECTOMY    . EYE SURGERY Bilateral 2013   Toric Lens implants  . EYE SURGERY Right 2014   corneal amniotic membrane  . GASTRIC BYPASS  2003  . KIDNEY DONATION  2004  . TOTAL KNEE ARTHROPLASTY Left 06/16/2015   Procedure: LEFT TOTAL KNEE ARTHROPLASTY;  Surgeon: Kathryne Hitchhristopher Y Blackman, MD;  Location: [facility];  Service: Orthopedics;  Laterality: Left;    There were no vitals filed for this visit.      Subjective Assessment - 08/01/16 0933    Subjective Pt reports shoulder feeling better today.    Pertinent History d/c'd sling, S/P   Limitations House hold activities;Reading   Patient Stated Goals full ROM, going to [place] in 2 weeks, golf and bowling occasionally, able to use the Lt arm to take stress off the Rt arm   Currently  in Pain? Yes   Pain Score 2    Pain Location Shoulder   Pain Orientation Left   Pain Descriptors / Indicators Discomfort   Pain Type Surgical pain   Pain Onset More than a month ago                         [facility] Adult [facility] Treatment/Exercise - 08/01/16 0001      Shoulder Exercises: Supine   Flexion --  Supine, AAROM flexion with VC to straighten elbox 2x10     Shoulder Exercises: Seated   Other Seated Exercises UE ranger all planes 20x each  Difficulty with this exercsise   Other Seated Exercises Table slides  flexion, abduction, external rotation     Shoulder Exercises: Isometric Strengthening   Flexion 5X5"   Extension 5X5"   External Rotation 5X5"   Internal Rotation 5X5"   ABduction 5X5"     Modalities   Modalities Electrical Stimulation;Cryotherapy     Cryotherapy   Number Minutes Cryotherapy 15 Minutes   Cryotherapy Location Shoulder   Type of Cryotherapy Ice pack     Electrical Stimulation   Electrical Stimulation Location Shoulder   Electrical Stimulation Action IFC   Electrical Stimulation Parameters To tolerance 15 minutes  Electrical Stimulation Goals Pain     Manual Therapy   Manual Therapy Soft tissue mobilization;Passive ROM   Soft tissue mobilization Rt deltoid   Passive ROM Lt shoulder ROM to tolerance                  PT Short Term Goals - 07/08/16 0859      PT SHORT TERM GOAL #1   Title be independent in initial HEP   Time 4   Period Weeks   Status Achieved           PT Long Term Goals - 08/01/16 0934      PT LONG TERM GOAL #1   Title be independent in advanced HEP   Time 8   Period Weeks   Status On-going     PT LONG TERM GOAL #2   Title reduce FOTO to < or = to 37% limitation   Time 8   Period Weeks   Status On-going     PT LONG TERM GOAL #3   Title able to lift 1 lb overhead with good scapular mobility for improved functional reaching during household tasks   Time 8   Period Weeks   Status  On-going     PT LONG TERM GOAL #4   Title Lt shoulder flex and abduction strength 4+/5 without increased pain for improved functional reaching   Time 8   Period Weeks   Status On-going     PT LONG TERM GOAL #5   Title AROM Lt shoulder 160 degrees of flexion and abduction for improved functional reaching overhead   Time 8   Period Weeks   Status On-going     PT LONG TERM GOAL #6   Title able to perform household chores without increased pain in Lt shoulder   Time 8   Period Weeks   Status On-going               Plan - 08/01/16 0946    Clinical Impression Statement Pt reports shoulder feeling ok today. Difficulty with against gravity stretches but able to tolerate gravity minimized stretches and isometrics well. Gentle ROM within pain free range. Pt will continue to benefit from skilled therapy for soulder strength and ROM.    Rehab Potential Excellent   Clinical Impairments Affecting Rehab Potential Lt shoulder RTC repair   PT Frequency 2x / week   PT Duration 8 weeks   PT Treatment/Interventions ADLs/Self Care Home Management;Electrical Stimulation;Cryotherapy;Moist Heat;Therapeutic activities;Therapeutic exercise;Neuromuscular re-education;Patient/family education;Manual techniques;Passive range of motion;Dry needling;Taping;Vasopneumatic Device   PT Next Visit Plan PROM, AAROM ROM, Isometrics strengthening   Consulted and Agree with Plan of Care Patient      Patient will benefit from skilled therapeutic intervention in order to improve the following deficits and impairments:  Decreased activity tolerance, Decreased strength, Decreased range of motion, Increased fascial restricitons, Impaired UE functional use, Postural dysfunction, Increased edema  Visit Diagnosis: Acute pain of left shoulder  Stiffness of left shoulder, not elsewhere classified     Problem List Patient Active Problem List   Diagnosis Date Noted  . Osteoarthritis of left knee 06/16/2015  .  Status post total left knee replacement 06/16/2015  . MYALGIA 12/19/2009  . SUBSTANCE ABUSE 02/06/2009  . KNEE PAIN, LEFT 10/06/2008  . URI 07/11/2008  . ACUTE MAXILLARY SINUSITIS 03/17/2008  . ADD 09/14/2007  . COLONIC POLYPS, HX OF 08/03/2007  . Essential hypertension 01/20/2007  . OBESITY NOS 12/04/2006  . Allergic rhinitis 12/04/2006  . GASTROJEJUNOSTOMY,  HX OF 12/04/2006  . BREAST BIOPSY, HX OF 12/04/2006    [name] PTA 08/01/2016, 10:27 AM  [facility] [address], [address] [place], [place], [address] Phone: [phone]   Fax:  [phone]  Name: Leslie [name] MRN: [id number] Date of Birth: May 09, 1951

## 2016-08-06 ENCOUNTER — Encounter: Payer: Medicare Other | Admitting: Physical Therapy

## 2016-08-08 ENCOUNTER — Encounter: Payer: Medicare Other | Admitting: Physical Therapy

## 2016-08-12 ENCOUNTER — Telehealth: Payer: Self-pay | Admitting: Internal Medicine

## 2016-08-12 ENCOUNTER — Other Ambulatory Visit: Payer: Self-pay | Admitting: Internal Medicine

## 2016-08-12 MED ORDER — AMPHETAMINE-DEXTROAMPHET ER 30 MG PO CP24: 30.0000 mg | ORAL_CAPSULE | ORAL | 0 refills | Status: DC

## 2016-08-12 MED ORDER — AMPHETAMINE-DEXTROAMPHETAMINE 20 MG PO TABS: ORAL_TABLET | ORAL | 0 refills | Status: DC

## 2016-08-12 NOTE — Telephone Encounter (Signed)
Pt notified Rx ready for pickup. Rx printed and signed.  

## 2016-08-12 NOTE — Telephone Encounter (Signed)
Pt request refill °amphetamine-dextroamphetamine (ADDERALL XR) 30 MG 24 hr capsule °amphetamine-dextroamphetamine (ADDERALL) 20 MG tablet °3 mo supply °

## 2016-08-13 ENCOUNTER — Encounter: Payer: Self-pay | Admitting: Physical Therapy

## 2016-08-13 ENCOUNTER — Ambulatory Visit: Payer: Medicare Other | Admitting: Physical Therapy

## 2016-08-13 DIAGNOSIS — M25612 Stiffness of left shoulder, not elsewhere classified: Secondary | ICD-10-CM

## 2016-08-13 DIAGNOSIS — M6281 Muscle weakness (generalized): Secondary | ICD-10-CM

## 2016-08-13 DIAGNOSIS — M25512 Pain in left shoulder: Secondary | ICD-10-CM | POA: Diagnosis not present

## 2016-08-14 NOTE — Therapy (Signed)
[facility] [address], [address] [place], [place], [address] Phone: [phone]   Fax:  [phone]  Physical Therapy Treatment  Patient Details  Name: Leslie [name] MRN: [id number] Date of Birth: [birth year] Referring Provider: Kathryne Hitch, MD  Encounter Date: 08/13/2016      PT End of Session - 08/13/16 1022    Visit Number 6   Number of Visits 10   Date for PT Re-Evaluation 08/21/16   Authorization Type mcare - gcodes at 10th visit   PT Start Time 1020   PT Stop Time 1115   PT Time Calculation (min) 55 min   Activity Tolerance Patient tolerated treatment well   Behavior During Therapy [facility] for tasks assessed/performed      Past Medical History:  Diagnosis Date  . ADD (attention deficit disorder)   . Allergy   . Arthritis 2016   osteoarthritis left knee  . Colon polyps   . Hypertension   . Left knee pain    chronic  . Narcotic addiction (HCC)   . Obesity    post gastric bypass  . Vitamin D deficiency     Past Surgical History:  Procedure Laterality Date  . arthscopic knee surgery Left    several times  . BREAST SURGERY  years ago   breast biopsy  . CHOLECYSTECTOMY    . EYE SURGERY Bilateral 2013   Toric Lens implants  . EYE SURGERY Right 2014   corneal amniotic membrane  . GASTRIC BYPASS  2003  . KIDNEY DONATION  2004  . TOTAL KNEE ARTHROPLASTY Left 06/16/2015   Procedure: LEFT TOTAL KNEE ARTHROPLASTY;  Surgeon: Kathryne Hitch, MD;  Location: [facility];  Service: Orthopedics;  Laterality: Left;    There were no vitals filed for this visit.      Subjective Assessment - 08/13/16 1021    Subjective Pt reports shoulder feeling pretty good today.    Pertinent History d/c'd sling, S/P   Limitations House hold activities;Reading   Patient Stated Goals full ROM, going to [place] in 2 weeks, golf and bowling occasionally, able to use the Lt arm to take stress off the Rt arm   Currently in Pain? Yes   Pain Score 2    Pain Location Shoulder   Pain Orientation Left   Pain Descriptors / Indicators Discomfort   Pain Type Surgical pain   Pain Onset More than a month ago                         [facility] Adult PT Treatment/Exercise - 08/14/16 0001      Shoulder Exercises: Seated   Other Seated Exercises Table slides  flexion, abduction, external rotation     Shoulder Exercises: Pulleys   Flexion 2 minutes   ABduction 2 minutes     Shoulder Exercises: Isometric Strengthening   Flexion 5X5"   Extension 5X5"   External Rotation 5X5"   Internal Rotation 5X5"   ABduction 5X5"     Moist Heat Therapy   Number Minutes Moist Heat 15 Minutes   Moist Heat Location Shoulder     Electrical Stimulation   Electrical Stimulation Location Shoulder   Electrical Stimulation Action IFC   Electrical Stimulation Parameters To tolerance   Electrical Stimulation Goals Pain     Manual Therapy   Manual Therapy Soft tissue mobilization;Passive ROM   Soft tissue mobilization Rt deltoid   Passive ROM Lt shoulder ROM to tolerance  PT Short Term Goals - 08/13/16 1022      PT SHORT TERM GOAL #2   Title able to achieve 120 degrees of PROM flexion for improved functional reaching   Time 4   Period Weeks   Status On-going           PT Long Term Goals - 08/13/16 1023      PT LONG TERM GOAL #1   Title be independent in advanced HEP   Time 8   Period Weeks   Status On-going     PT LONG TERM GOAL #2   Title reduce FOTO to < or = to 37% limitation   Time 8   Period Weeks   Status On-going     PT LONG TERM GOAL #3   Title able to lift 1 lb overhead with good scapular mobility for improved functional reaching during household tasks   Time 8   Period Weeks   Status On-going     PT LONG TERM GOAL #4   Title Lt shoulder flex and abduction strength 4+/5 without increased pain for improved functional reaching   Time 8    Period Weeks   Status On-going     PT LONG TERM GOAL #5   Title AROM Lt shoulder 160 degrees of flexion and abduction for improved functional reaching overhead   Time 8   Period Weeks   Status On-going     PT LONG TERM GOAL #6   Title able to perform household chores without increased pain in Lt shoulder   Time 8   Period Weeks   Status On-going               Plan - 08/13/16 1327    Clinical Impression Statement Pt reports feeling sore after last session but knows she needs it. Able to tolerate all AAROM and isometric strengthening well. Continues to have limitations with shoulder PROM due to pain and tightness but progressing well. Pt will continue to benefit from skilled therapy for shoulder strength and stability.    Rehab Potential Excellent   Clinical Impairments Affecting Rehab Potential Lt shoulder RTC repair   PT Frequency 2x / week   PT Duration 8 weeks   PT Treatment/Interventions ADLs/Self Care Home Management;Electrical Stimulation;Cryotherapy;Moist Heat;Therapeutic activities;Therapeutic exercise;Neuromuscular re-education;Patient/family education;Manual techniques;Passive range of motion;Dry needling;Taping;Vasopneumatic Device   PT Next Visit Plan Biceps and striceps strength      Patient will benefit from skilled therapeutic intervention in order to improve the following deficits and impairments:  Decreased activity tolerance, Decreased strength, Decreased range of motion, Increased fascial restricitons, Impaired UE functional use, Postural dysfunction, Increased edema  Visit Diagnosis: Acute pain of left shoulder  Stiffness of left shoulder, not elsewhere classified  Muscle weakness (generalized)     Problem List Patient Active Problem List   Diagnosis Date Noted  . Osteoarthritis of left knee 06/16/2015  . Status post total left knee replacement 06/16/2015  . MYALGIA 12/19/2009  . SUBSTANCE ABUSE 02/06/2009  . KNEE PAIN, LEFT 10/06/2008  . URI  07/11/2008  . ACUTE MAXILLARY SINUSITIS 03/17/2008  . ADD 09/14/2007  . COLONIC POLYPS, HX OF 08/03/2007  . Essential hypertension 01/20/2007  . OBESITY NOS 12/04/2006  . Allergic rhinitis 12/04/2006  . GASTROJEJUNOSTOMY, HX OF 12/04/2006  . BREAST BIOPSY, HX OF 12/04/2006    Dessa PhiKatherine Matthews PTA 08/14/2016, 3:53 PM  [facility] [address], [address] [place], [place], [address] Phone: [phone]   Fax:  [phone]  Name: AVANTHIKA DEHNERT MRN: [id number] Date of Birth: 1951-06-06

## 2016-08-15 ENCOUNTER — Ambulatory Visit: Payer: Medicare Other | Admitting: Physical Therapy

## 2016-08-15 ENCOUNTER — Encounter: Payer: Self-pay | Admitting: Physical Therapy

## 2016-08-15 DIAGNOSIS — M6281 Muscle weakness (generalized): Secondary | ICD-10-CM

## 2016-08-15 DIAGNOSIS — M25512 Pain in left shoulder: Secondary | ICD-10-CM | POA: Diagnosis not present

## 2016-08-15 DIAGNOSIS — M25612 Stiffness of left shoulder, not elsewhere classified: Secondary | ICD-10-CM

## 2016-08-15 NOTE — Patient Instructions (Signed)
Lat Pull Down    Face anchor with knees slightly flexed. Palms down, pull arms down to sides. Repeat __ times per set. Do __ sets per session. Do __ sessions per week. Anchor Height: Over Head  http://tub.exer.us/90   Copyright  VHI. All rights reserved.  Resistive Band Rowing    With resistive band anchored in door, grasp both ends. Keeping elbows bent, pull back, squeezing shoulder blades together. Hold ____ seconds. Repeat ____ times. Do ____ sessions per day.  http://gt2.exer.us/98   Copyright  VHI. All rights reserved.  Leslie [name], PTA 08/15/16 9:41 AM  [facility] [address], [address] [place], [place] [address] Phone # [phone] Fax [phone]

## 2016-08-15 NOTE — Therapy (Signed)
[facility] [address], [address] [place], [place], [address] Phone: [phone]   Fax:  [phone]  Physical Therapy Treatment  Patient Details  Name: Leslie [name] MRN: [id number] Date of Birth: Oct 10, 1950 Referring Provider: Kathryne Hitch, MD  Encounter Date: 08/15/2016      PT End of Session - 08/15/16 0929    Visit Number 7   Number of Visits 10   Date for PT Re-Evaluation 08/21/16   Authorization Type mcare - gcodes at 10th visit   PT Start Time 0928   PT Stop Time 1030   PT Time Calculation (min) 62 min   Activity Tolerance Patient tolerated treatment well   Behavior During Therapy [facility] for tasks assessed/performed      Past Medical History:  Diagnosis Date  . ADD (attention deficit disorder)   . Allergy   . Arthritis 2016   osteoarthritis left knee  . Colon polyps   . Hypertension   . Left knee pain    chronic  . Narcotic addiction (HCC)   . Obesity    post gastric bypass  . Vitamin D deficiency     Past Surgical History:  Procedure Laterality Date  . arthscopic knee surgery Left    several times  . BREAST SURGERY  years ago   breast biopsy  . CHOLECYSTECTOMY    . EYE SURGERY Bilateral 2013   Toric Lens implants  . EYE SURGERY Right 2014   corneal amniotic membrane  . GASTRIC BYPASS  2003  . KIDNEY DONATION  2004  . TOTAL KNEE ARTHROPLASTY Left 06/16/2015   Procedure: LEFT TOTAL KNEE ARTHROPLASTY;  Surgeon: Kathryne Hitch, MD;  Location: [facility];  Service: Orthopedics;  Laterality: Left;    There were no vitals filed for this visit.      Subjective Assessment - 08/15/16 0929    Subjective Pt reports shoulder feeling ok today .   Pertinent History d/c'd sling, S/P   Limitations House hold activities;Reading   Patient Stated Goals full ROM, going to [place] in 2 weeks, golf and bowling occasionally, able to use the Lt arm to take stress off the Rt arm   Currently in  Pain? No/denies                         [facility] Adult PT Treatment/Exercise - 08/15/16 0001      Shoulder Exercises: Supine   External Rotation Strengthening;Left;20 reps   Flexion Strengthening;Left;20 reps     Shoulder Exercises: Sidelying   External Rotation Strengthening;20 reps   ABduction Strengthening;Left;20 reps     Shoulder Exercises: Standing   External Rotation Strengthening;Left;10 reps;Theraband   Theraband Level (Shoulder External Rotation) Level 1 (Yellow)   Flexion AAROM  Finger ladder   Extension Strengthening;Both;20 reps;Theraband   Theraband Level (Shoulder Extension) Level 1 (Yellow)   Row Strengthening;Both;20 reps;Theraband   Theraband Level (Shoulder Row) Level 1 (Yellow)   Other Standing Exercises UE ranger on stairs  forward/ back side/side circles   Other Standing Exercises Internal rotation stretch  with cane     Shoulder Exercises: Pulleys   Flexion 2 minutes   ABduction 2 minutes     Shoulder Exercises: Isometric Strengthening   Flexion --  6 way isometrics with ball     Moist Heat Therapy   Number Minutes Moist Heat 15 Minutes   Moist Heat Location Shoulder     Electrical Stimulation   Electrical Stimulation Location Shoulder  Electrical Stimulation Action IFC   Electrical Stimulation Parameters To tolerance   Electrical Stimulation Goals Pain     Manual Therapy   Manual Therapy Soft tissue mobilization;Passive ROM   Soft tissue mobilization Rt deltoid, pec minor   Passive ROM Lt shoulder ROM to tolerance                PT Education - 08/15/16 0941    Education provided Yes   Education Details shoulder strenghtening AROM   Person(s) Educated Patient   Methods Explanation;Demonstration;Handout   Comprehension Verbalized understanding          PT Short Term Goals - 08/13/16 1022      PT SHORT TERM GOAL #2   Title able to achieve 120 degrees of PROM flexion for improved functional reaching   Time 4    Period Weeks   Status On-going           PT Long Term Goals - 08/13/16 1023      PT LONG TERM GOAL #1   Title be independent in advanced HEP   Time 8   Period Weeks   Status On-going     PT LONG TERM GOAL #2   Title reduce FOTO to < or = to 37% limitation   Time 8   Period Weeks   Status On-going     PT LONG TERM GOAL #3   Title able to lift 1 lb overhead with good scapular mobility for improved functional reaching during household tasks   Time 8   Period Weeks   Status On-going     PT LONG TERM GOAL #4   Title Lt shoulder flex and abduction strength 4+/5 without increased pain for improved functional reaching   Time 8   Period Weeks   Status On-going     PT LONG TERM GOAL #5   Title AROM Lt shoulder 160 degrees of flexion and abduction for improved functional reaching overhead   Time 8   Period Weeks   Status On-going     PT LONG TERM GOAL #6   Title able to perform household chores without increased pain in Lt shoulder   Time 8   Period Weeks   Status On-going               Plan - 08/15/16 0945    Clinical Impression Statement Pt able to tolerate all exercises well. Difficulty with external rotation and flexion due to weakness. Will continue to progress shoulder strength and stability and ROM.    Rehab Potential Excellent   Clinical Impairments Affecting Rehab Potential Lt shoulder RTC repair   PT Frequency 2x / week   PT Duration 8 weeks   PT Treatment/Interventions ADLs/Self Care Home Management;Electrical Stimulation;Cryotherapy;Moist Heat;Therapeutic activities;Therapeutic exercise;Neuromuscular re-education;Patient/family education;Manual techniques;Passive range of motion;Dry needling;Taping;Vasopneumatic Device   PT Next Visit Plan [facility] stacking/ reaching   Consulted and Agree with Plan of Care Patient      Patient will benefit from skilled therapeutic intervention in order to improve the following deficits and impairments:  Decreased  activity tolerance, Decreased strength, Decreased range of motion, Increased fascial restricitons, Impaired UE functional use, Postural dysfunction, Increased edema  Visit Diagnosis: Acute pain of left shoulder  Muscle weakness (generalized)  Stiffness of left shoulder, not elsewhere classified     Problem List Patient Active Problem List   Diagnosis Date Noted  . Osteoarthritis of left knee 06/16/2015  . Status post total left knee replacement 06/16/2015  . MYALGIA 12/19/2009  .  SUBSTANCE ABUSE 02/06/2009  . KNEE PAIN, LEFT 10/06/2008  . URI 07/11/2008  . ACUTE MAXILLARY SINUSITIS 03/17/2008  . ADD 09/14/2007  . COLONIC POLYPS, HX OF 08/03/2007  . Essential hypertension 01/20/2007  . OBESITY NOS 12/04/2006  . Allergic rhinitis 12/04/2006  . GASTROJEJUNOSTOMY, HX OF 12/04/2006  . BREAST BIOPSY, HX OF 12/04/2006    [name] PTA 08/15/2016, 2:09 PM  [facility] [address] [place], [place], [address] Phone: [phone]   Fax:  [phone]  Name: MERCY LEPPLA MRN: [id number] Date of Birth: [birth year]

## 2016-08-20 ENCOUNTER — Ambulatory Visit: Payer: Medicare Other | Admitting: Physical Therapy

## 2016-08-22 ENCOUNTER — Ambulatory Visit: Payer: Medicare Other | Attending: Orthopaedic Surgery | Admitting: Physical Therapy

## 2016-08-22 DIAGNOSIS — M25512 Pain in left shoulder: Secondary | ICD-10-CM | POA: Insufficient documentation

## 2016-08-22 DIAGNOSIS — M6281 Muscle weakness (generalized): Secondary | ICD-10-CM | POA: Diagnosis present

## 2016-08-22 DIAGNOSIS — M25612 Stiffness of left shoulder, not elsewhere classified: Secondary | ICD-10-CM | POA: Diagnosis present

## 2016-08-23 ENCOUNTER — Other Ambulatory Visit: Payer: Self-pay | Admitting: Internal Medicine

## 2016-08-23 NOTE — Therapy (Signed)
[facility] [address] [place], [place], [address] Phone: [phone]   Fax:  [phone]  Physical Therapy Treatment  Patient Details  Name: Leslie [name] MRN: [id number] Date of Birth: Sep 05, 1950 Referring Provider: Kathryne Hitch, MD  Encounter Date: 08/22/2016      PT End of Session - 08/22/16 1053    Visit Number 8   Number of Visits 10   Date for PT Re-Evaluation 10/18/16   Authorization Type mcare - gcodes at 10th visit   PT Start Time 1015   PT Stop Time 1100   PT Time Calculation (min) 45 min   Activity Tolerance Patient tolerated treatment well   Behavior During Therapy [facility] for tasks assessed/performed      Past Medical History:  Diagnosis Date  . ADD (attention deficit disorder)   . Allergy   . Arthritis 2016   osteoarthritis left knee  . Colon polyps   . Hypertension   . Left knee pain    chronic  . Narcotic addiction (HCC)   . Obesity    post gastric bypass  . Vitamin D deficiency     Past Surgical History:  Procedure Laterality Date  . arthscopic knee surgery Left    several times  . BREAST SURGERY  years ago   breast biopsy  . CHOLECYSTECTOMY    . EYE SURGERY Bilateral 2013   Toric Lens implants  . EYE SURGERY Right 2014   corneal amniotic membrane  . GASTRIC BYPASS  2003  . KIDNEY DONATION  2004  . TOTAL KNEE ARTHROPLASTY Left 06/16/2015   Procedure: LEFT TOTAL KNEE ARTHROPLASTY;  Surgeon: Kathryne Hitch, MD;  Location: [facility];  Service: Orthopedics;  Laterality: Left;    There were no vitals filed for this visit.      Subjective Assessment - 08/22/16 1033    Subjective Pt reporst still can't reach forward.  States reaching back is much easier   Limitations House hold activities   Patient Stated Goals be able to reach forward   Currently in Pain? Yes   Pain Score 2    Pain Location Shoulder   Pain Orientation Left;Anterior;Proximal   Pain  Descriptors / Indicators Discomfort   Pain Type Surgical pain   Pain Onset More than a month ago   Pain Frequency Intermittent   Aggravating Factors  reacing too far   Effect of Pain on Daily Activities unable to reach overhead            [facility] PT Assessment - 08/23/16 0001      Assessment   Next MD Visit no     Observation/Other Assessments   Focus on Therapeutic Outcomes (FOTO)  49% limitation  goal 37%     AROM   Left Shoulder Flexion 65 Degrees   Left Shoulder ABduction 85 Degrees   Left Shoulder Internal Rotation --  T8     Strength   Left Shoulder Flexion 2+/5   Left Shoulder ABduction 3-/5   Left Shoulder Internal Rotation 4+/5   Left Shoulder External Rotation 3-/5   Left Shoulder Horizontal ABduction 3-/5                     [facility] Adult PT Treatment/Exercise - 08/23/16 0001      Shoulder Exercises: ROM/Strengthening   Other ROM/Strengthening Exercises standing AAROM flexion and abduction cane   Other ROM/Strengthening Exercises supine AAROM flexion      Shoulder Exercises: Isometric Strengthening  Flexion 5X5"                  PT Short Term Goals - 08/22/16 1025      PT SHORT TERM GOAL #1   Title be independent in initial HEP   Time 4   Period Weeks   Status Achieved     PT SHORT TERM GOAL #2   Title able to achieve 120 degrees of PROM flexion for improved functional reaching   Baseline 150   Time 4   Period Weeks   Status Achieved           PT Long Term Goals - 08/23/16 1610      PT LONG TERM GOAL #1   Title be independent in advanced HEP   Time 8   Period Weeks   Status On-going     PT LONG TERM GOAL #2   Title reduce FOTO to < or = to 37% limitation   Time 8   Period Weeks   Status On-going     PT LONG TERM GOAL #3   Title able to lift 1 lb overhead with good scapular mobility for improved functional reaching during household tasks   Time 8   Period Weeks   Status On-going     PT LONG TERM GOAL  #4   Title Lt shoulder flex and abduction strength 4+/5 without increased pain for improved functional reaching   Time 8   Period Weeks   Status On-going     PT LONG TERM GOAL #5   Title AROM Lt shoulder 160 degrees of flexion and abduction for improved functional reaching overhead   Time 8   Period Weeks   Status On-going     PT LONG TERM GOAL #6   Title able to perform household chores without increased pain in Lt shoulder   Time 8   Period Weeks   Status On-going               Plan - 08/22/16 1055    Clinical Impression Statement Pt is progressing slowly due to missing multiple appointments throughout plan of care at this time.  She demonstrates limitation in AROM flexion 65 degrees and abduction 85 degrees which is limiting her function.  Pt has weakness from 2+/5 in Lt shoulder.  Pt reports decreased pain only 2/10 at the most.  Pt will benefit from continued skilled PT in order to return to functional activities.   Rehab Potential Excellent   Clinical Impairments Affecting Rehab Potential Lt shoulder RTC repair   PT Frequency 2x / week   PT Duration 8 weeks   PT Treatment/Interventions ADLs/Self Care Home Management;Electrical Stimulation;Cryotherapy;Moist Heat;Therapeutic activities;Therapeutic exercise;Neuromuscular re-education;Patient/family education;Manual techniques;Passive range of motion;Dry needling;Taping;Vasopneumatic Device   PT Next Visit Plan Cone stacking/ reaching, PROM flexion, scapular stability, review cane with cues to avoid shoulder hiking   Consulted and Agree with Plan of Care Patient      Patient will benefit from skilled therapeutic intervention in order to improve the following deficits and impairments:  Decreased activity tolerance, Decreased strength, Decreased range of motion, Increased fascial restricitons, Impaired UE functional use, Postural dysfunction, Increased edema  Visit Diagnosis: Acute pain of left shoulder - Plan: PT plan of  care cert/re-cert  Muscle weakness (generalized) - Plan: PT plan of care cert/re-cert  Stiffness of left shoulder, not elsewhere classified - Plan: PT plan of care cert/re-cert     Problem List Patient Active Problem List   Diagnosis Date Noted  .  Osteoarthritis of left knee 06/16/2015  . Status post total left knee replacement 06/16/2015  . MYALGIA 12/19/2009  . SUBSTANCE ABUSE 02/06/2009  . KNEE PAIN, LEFT 10/06/2008  . URI 07/11/2008  . ACUTE MAXILLARY SINUSITIS 03/17/2008  . ADD 09/14/2007  . COLONIC POLYPS, HX OF 08/03/2007  . Essential hypertension 01/20/2007  . OBESITY NOS 12/04/2006  . Allergic rhinitis 12/04/2006  . GASTROJEJUNOSTOMY, HX OF 12/04/2006  . BREAST BIOPSY, HX OF 12/04/2006    Vincente PoliJakki Crosser, PT 08/23/2016, 10:10 AM  [facility] [address] [place], [place], [address] Phone: [phone]   Fax:  [phone]  Name: Leslie [name] MRN: [id number] Date of Birth: September 24, 1950

## 2016-08-27 ENCOUNTER — Ambulatory Visit: Payer: Medicare Other | Admitting: Physical Therapy

## 2016-08-27 ENCOUNTER — Encounter: Payer: Self-pay | Admitting: Physical Therapy

## 2016-08-27 DIAGNOSIS — M25512 Pain in left shoulder: Secondary | ICD-10-CM | POA: Diagnosis not present

## 2016-08-27 DIAGNOSIS — M6281 Muscle weakness (generalized): Secondary | ICD-10-CM

## 2016-08-27 DIAGNOSIS — M25612 Stiffness of left shoulder, not elsewhere classified: Secondary | ICD-10-CM

## 2016-08-27 NOTE — Therapy (Signed)
[facility] [address] [place], [place], [address] Phone: [phone]   Fax:  [phone]  Physical Therapy Treatment  Patient Details  Name: Leslie [name] MRN: [id number] Date of Birth: Apr 24, 1951 Referring Provider: Kathryne HitchBlackman, Christopher Y, MD  Encounter Date: 08/27/2016      PT End of Session - 08/27/16 1023    Visit Number 9   Number of Visits 10   Date for PT Re-Evaluation 10/18/16   Authorization Type mcare - gcodes at 10th visit   PT Start Time 1020   PT Stop Time 1110   PT Time Calculation (min) 50 min   Activity Tolerance Patient tolerated treatment well   Behavior During Therapy [facility] for tasks assessed/performed      Past Medical History:  Diagnosis Date  . ADD (attention deficit disorder)   . Allergy   . Arthritis 2016   osteoarthritis left knee  . Colon polyps   . Hypertension   . Left knee pain    chronic  . Narcotic addiction (HCC)   . Obesity    post gastric bypass  . Vitamin D deficiency     Past Surgical History:  Procedure Laterality Date  . arthscopic knee surgery Left    several times  . BREAST SURGERY  years ago   breast biopsy  . CHOLECYSTECTOMY    . EYE SURGERY Bilateral 2013   Toric Lens implants  . EYE SURGERY Right 2014   corneal amniotic membrane  . GASTRIC BYPASS  2003  . KIDNEY DONATION  2004  . TOTAL KNEE ARTHROPLASTY Left 06/16/2015   Procedure: LEFT TOTAL KNEE ARTHROPLASTY;  Surgeon: Kathryne Hitchhristopher Y Blackman, MD;  [facility];  Service: Orthopedics;  Laterality: Left;    There were no vitals filed for this visit.                       [facility] Adult PT Treatment/Exercise - 08/27/16 0001      Shoulder Exercises: Standing   Shoulder Elevation Strengthening;Left;Standing;15 reps  cone to 2nd shelf     Shoulder Exercises: ROM/Strengthening   UBE (Upper Arm Bike) 5x3 - fwd/back   Ball on Wall AAROM flexion   Other ROM/Strengthening  Exercises standing AAROM flexion and abduction cane - 20x each     Shoulder Exercises: Stretch   Table Stretch - Flexion --  15x   Table Stretch - Abduction --  15x   Table Stretch - External Rotation 5 reps;10 seconds     Moist Heat Therapy   Number Minutes Moist Heat 10 Minutes   Moist Heat Location Shoulder     Manual Therapy   Manual Therapy Joint mobilization   Manual therapy comments in sitting   Joint Mobilization A/P glenohumeral grade II-III   Soft tissue mobilization Lt deltoid, pec minor, upper trap                  PT Short Term Goals - 08/22/16 1025      PT SHORT TERM GOAL #1   Title be independent in initial HEP   Time 4   Period Weeks   Status Achieved     PT SHORT TERM GOAL #2   Title able to achieve 120 degrees of PROM flexion for improved functional reaching   Baseline 150   Time 4   Period Weeks   Status Achieved           PT Long Term Goals - 08/27/16  1025      PT LONG TERM GOAL #1   Title be independent in advanced HEP   Time 8   Period Weeks   Status On-going     PT LONG TERM GOAL #3   Title able to lift 1 lb overhead with good scapular mobility for improved functional reaching during household tasks   Baseline able to blowdry hair easier today   Time 8   Period Weeks   Status On-going     PT LONG TERM GOAL #4   Title Lt shoulder flex and abduction strength 4+/5 without increased pain for improved functional reaching   Time 8   Period Weeks   Status On-going               Plan - 08/27/16 1024    Clinical Impression Statement Able to perform more AAROM with cane today.  Fatigued with cone lifting and used Rt UE to assist Lt on last 2.  Pt needs skilled PT for increased ROM in order to return to all functional activities.   Rehab Potential Excellent   Clinical Impairments Affecting Rehab Potential Lt shoulder RTC repair   PT Frequency 2x / week   PT Duration 8 weeks   PT Treatment/Interventions ADLs/Self Care  Home Management;Electrical Stimulation;Cryotherapy;Moist Heat;Therapeutic activities;Therapeutic exercise;Neuromuscular re-education;Patient/family education;Manual techniques;Passive range of motion;Dry needling;Taping;Vasopneumatic Device   PT Next Visit Plan Cone stacking/ reaching, PROM flexion, scapular stability   Consulted and Agree with Plan of Care Patient      Patient will benefit from skilled therapeutic intervention in order to improve the following deficits and impairments:  Decreased activity tolerance, Decreased strength, Decreased range of motion, Increased fascial restricitons, Impaired UE functional use, Postural dysfunction, Increased edema  Visit Diagnosis: Acute pain of left shoulder  Muscle weakness (generalized)  Stiffness of left shoulder, not elsewhere classified     Problem List Patient Active Problem List   Diagnosis Date Noted  . Osteoarthritis of left knee 06/16/2015  . Status post total left knee replacement 06/16/2015  . MYALGIA 12/19/2009  . SUBSTANCE ABUSE 02/06/2009  . KNEE PAIN, LEFT 10/06/2008  . URI 07/11/2008  . ACUTE MAXILLARY SINUSITIS 03/17/2008  . ADD 09/14/2007  . COLONIC POLYPS, HX OF 08/03/2007  . Essential hypertension 01/20/2007  . OBESITY NOS 12/04/2006  . Allergic rhinitis 12/04/2006  . GASTROJEJUNOSTOMY, HX OF 12/04/2006  . BREAST BIOPSY, HX OF 12/04/2006    [name], PT 08/27/2016, 11:12 AM  [facility] [address] [place], [place], [address] Phone: [phone]   Fax:  [phone]  Name: Leslie [name] MRN: [id number] Date of Birth: Apr 23, 1951

## 2016-08-29 ENCOUNTER — Ambulatory Visit: Payer: Medicare Other | Admitting: Physical Therapy

## 2016-08-29 ENCOUNTER — Encounter: Payer: Self-pay | Admitting: Physical Therapy

## 2016-08-29 DIAGNOSIS — M25512 Pain in left shoulder: Secondary | ICD-10-CM

## 2016-08-29 DIAGNOSIS — M25612 Stiffness of left shoulder, not elsewhere classified: Secondary | ICD-10-CM

## 2016-08-29 DIAGNOSIS — M6281 Muscle weakness (generalized): Secondary | ICD-10-CM

## 2016-08-29 NOTE — Therapy (Signed)
[facility] [address] [place], [place], [address] Phone: [phone]   Fax:  [phone]  Physical Therapy Treatment  Patient Details  Name: Leslie HightKaylene W [name] MRN: [id number] Date of Birth: 1950-12-23 Referring Provider: Kathryne HitchBlackman, Christopher Y, MD  Encounter Date: 08/29/2016      PT End of Session - 08/29/16 1239    Visit Number 10   Number of Visits 10   Date for PT Re-Evaluation 10/18/16   Authorization Type mcare - gcodes at 10th visit   PT Start Time 1235   PT Stop Time 1306   PT Time Calculation (min) 31 min   Activity Tolerance Patient tolerated treatment well   Behavior During Therapy [facility] for tasks assessed/performed      Past Medical History:  Diagnosis Date  . ADD (attention deficit disorder)   . Allergy   . Arthritis 2016   osteoarthritis left knee  . Colon polyps   . Hypertension   . Left knee pain    chronic  . Narcotic addiction (HCC)   . Obesity    post gastric bypass  . Vitamin D deficiency     Past Surgical History:  Procedure Laterality Date  . arthscopic knee surgery Left    several times  . BREAST SURGERY  years ago   breast biopsy  . CHOLECYSTECTOMY    . EYE SURGERY Bilateral 2013   Toric Lens implants  . EYE SURGERY Right 2014   corneal amniotic membrane  . GASTRIC BYPASS  2003  . KIDNEY DONATION  2004  . TOTAL KNEE ARTHROPLASTY Left 06/16/2015   Procedure: LEFT TOTAL KNEE ARTHROPLASTY;  Surgeon: Kathryne Hitchhristopher Y Blackman, MD;  Location: [facility];  Service: Orthopedics;  Laterality: Left;    There were no vitals filed for this visit.      Subjective Assessment - 08/29/16 1238    Subjective Pt reports shoulder doing well. Denies pain at the moment   Pertinent History d/c'd sling, S/P   Limitations House hold activities   Patient Stated Goals be able to reach forward   Currently in Pain? No/denies   Pain Score 0-No pain            [facility] PT Assessment -  08/29/16 0001      AROM   Left Shoulder Flexion 65 Degrees   Left Shoulder ABduction 85 Degrees                     [facility] Adult PT Treatment/Exercise - 08/29/16 0001      Shoulder Exercises: Prone   Flexion Strengthening;Left;20 reps   Extension Strengthening;20 reps   Horizontal ABduction 1 Strengthening;Both;20 reps     Shoulder Exercises: Standing   Flexion AAROM  Finger ladder   Shoulder Elevation Strengthening;Left;Standing;15 reps  cone to 2nd shelf     Shoulder Exercises: ROM/Strengthening   UBE (Upper Arm Bike) 5x3 - fwd/back   Ball on Wall AAROM flexion                  PT Short Term Goals - 08/22/16 1025      PT SHORT TERM GOAL #1   Title be independent in initial HEP   Time 4   Period Weeks   Status Achieved     PT SHORT TERM GOAL #2   Title able to achieve 120 degrees of PROM flexion for improved functional reaching   Baseline 150   Time 4   Period Weeks   Status  Achieved           PT Long Term Goals - 08/27/16 1025      PT LONG TERM GOAL #1   Title be independent in advanced HEP   Time 8   Period Weeks   Status On-going     PT LONG TERM GOAL #3   Title able to lift 1 lb overhead with good scapular mobility for improved functional reaching during household tasks   Baseline able to blowdry hair easier today   Time 8   Period Weeks   Status On-going     PT LONG TERM GOAL #4   Title Lt shoulder flex and abduction strength 4+/5 without increased pain for improved functional reaching   Time 8   Period Weeks   Status On-going               Plan - 08/29/16 1405    Clinical Impression Statement Pt toelrated all exercises well with some muscle fatigue especially with cone stacking. Will continue to progress muscle endurance and ROM.    Rehab Potential Excellent   Clinical Impairments Affecting Rehab Potential Lt shoulder RTC repair   PT Frequency 2x / week   PT Duration 8 weeks   PT Treatment/Interventions  ADLs/Self Care Home Management;Electrical Stimulation;Cryotherapy;Moist Heat;Therapeutic activities;Therapeutic exercise;Neuromuscular re-education;Patient/family education;Manual techniques;Passive range of motion;Dry needling;Taping;Vasopneumatic Device   PT Next Visit Plan isometrics, muscle endurance, ROM   Consulted and Agree with Plan of Care Patient      Patient will benefit from skilled therapeutic intervention in order to improve the following deficits and impairments:  Decreased activity tolerance, Decreased strength, Decreased range of motion, Increased fascial restricitons, Impaired UE functional use, Postural dysfunction, Increased edema  Visit Diagnosis: Acute pain of left shoulder  Muscle weakness (generalized)  Stiffness of left shoulder, not elsewhere classified   Gcodes: Carrying moving objects Current status: CK Goal status: [employer], PT 08/30/16 7:57 AM   Problem List Patient Active Problem List   Diagnosis Date Noted  . Osteoarthritis of left knee 06/16/2015  . Status post total left knee replacement 06/16/2015  . MYALGIA 12/19/2009  . SUBSTANCE ABUSE 02/06/2009  . KNEE PAIN, LEFT 10/06/2008  . URI 07/11/2008  . ACUTE MAXILLARY SINUSITIS 03/17/2008  . ADD 09/14/2007  . COLONIC POLYPS, HX OF 08/03/2007  . Essential hypertension 01/20/2007  . OBESITY NOS 12/04/2006  . Allergic rhinitis 12/04/2006  . GASTROJEJUNOSTOMY, HX OF 12/04/2006  . BREAST BIOPSY, HX OF 12/04/2006   Leslie [name], PTA 08/29/16 2:09 PM  [name], PT 08/30/16 7:57 AM  [facility] [address] [place], [place], [address] Phone: [phone]   Fax:  [phone]  Name: Leslie [name] MRN: [id number] Date of Birth: [birth year]

## 2016-09-03 ENCOUNTER — Ambulatory Visit: Payer: Medicare Other | Admitting: Physical Therapy

## 2016-09-03 DIAGNOSIS — M6281 Muscle weakness (generalized): Secondary | ICD-10-CM

## 2016-09-03 DIAGNOSIS — M25512 Pain in left shoulder: Secondary | ICD-10-CM | POA: Diagnosis not present

## 2016-09-03 DIAGNOSIS — M25612 Stiffness of left shoulder, not elsewhere classified: Secondary | ICD-10-CM

## 2016-09-03 NOTE — Therapy (Signed)
[facility] [address] [place], [place], [address] Phone: [phone]   Fax:  [phone]  Physical Therapy Treatment  Patient Details  Name: Leslie [name] MRN: [id number] Date of Birth: 1951/05/24 Referring Provider: Kathryne HitchBlackman, Christopher Y, MD  Encounter Date: 09/03/2016      PT End of Session - 09/03/16 1026    Visit Number 11   Number of Visits 20   Date for PT Re-Evaluation 10/18/16   Authorization Type mcare - gcodes at 20th visit   PT Start Time 1018   PT Stop Time 1105   PT Time Calculation (min) 47 min   Activity Tolerance Patient tolerated treatment well   Behavior During Therapy [facility] for tasks assessed/performed      Past Medical History:  Diagnosis Date  . ADD (attention deficit disorder)   . Allergy   . Arthritis 2016   osteoarthritis left knee  . Colon polyps   . Hypertension   . Left knee pain    chronic  . Narcotic addiction (HCC)   . Obesity    post gastric bypass  . Vitamin D deficiency     Past Surgical History:  Procedure Laterality Date  . arthscopic knee surgery Left    several times  . BREAST SURGERY  years ago   breast biopsy  . CHOLECYSTECTOMY    . EYE SURGERY Bilateral 2013   Toric Lens implants  . EYE SURGERY Right 2014   corneal amniotic membrane  . GASTRIC BYPASS  2003  . KIDNEY DONATION  2004  . TOTAL KNEE ARTHROPLASTY Left 06/16/2015   Procedure: LEFT TOTAL KNEE ARTHROPLASTY;  Surgeon: Kathryne Hitchhristopher Y Blackman, MD;  Location: [facility];  Service: Orthopedics;  Laterality: Left;    There were no vitals filed for this visit.      Subjective Assessment - 09/03/16 1023    Subjective Pt states she hasn't done any stretching or exercises since Friday because she didn't make time.  Noticed she got something out of the refridgerator.  Pt arrived a few minutes late.   Limitations House hold activities   Currently in Pain? No/denies                          [facility] Treatment/Exercise - 09/03/16 0001      Shoulder Exercises: Prone   Flexion Strengthening;Left;20 reps   Extension Strengthening;20 reps;Theraband   Theraband Level (Shoulder Extension) Level 1 (Yellow)   External Rotation Strengthening;Left;20 reps;Theraband   Theraband Level (Shoulder External Rotation) Level 1 (Yellow)     Shoulder Exercises: Standing   Flexion AROM;Left     Shoulder Exercises: Pulleys   Flexion 2 minutes     Manual Therapy   Manual Therapy Joint mobilization;Soft tissue mobilization;Passive ROM   Manual therapy comments supine   Joint Mobilization A/P glenohumeral grade II-III   Soft tissue mobilization Lt deltoid, pec minor, upper trap   Passive ROM Lt shoulder ROM to tolerance                PT Education - 09/03/16 1105    Education provided Yes   Education Details shoulder band exercises - rows, extension, external rotation; shoulder flexion AROM in front of mirror   Person(s) Educated Patient   Methods Explanation;Demonstration;Tactile cues;Verbal cues;Handout   Comprehension Verbalized understanding;Returned demonstration          PT Short Term Goals - 08/22/16 1025      PT SHORT  TERM GOAL #1   Title be independent in initial HEP   Time 4   Period Weeks   Status Achieved     PT SHORT TERM GOAL #2   Title able to achieve 120 degrees of PROM flexion for improved functional reaching   Baseline 150   Time 4   Period Weeks   Status Achieved           PT Long Term Goals - 09/03/16 1025      PT LONG TERM GOAL #1   Title be independent in advanced HEP   Time 8   Period Weeks   Status On-going     PT LONG TERM GOAL #4   Title Lt shoulder flex and abduction strength 4+/5 without increased pain for improved functional reaching   Time 8   Period Weeks   Status On-going     PT LONG TERM GOAL #6   Title able to perform household chores without increased pain in Lt shoulder    Period Weeks   Status On-going               Plan - 09/03/16 1106    Clinical Impression Statement Patient had difficulty hanging coat up and elevating Left shoulder to perform task.  Continues to have fatigue with flexion exercises.  Continues to need skilled PT due to weakness and decreaased ROM needed for overhead activities.   Rehab Potential Excellent   Clinical Impairments Affecting Rehab Potential Lt shoulder RTC repair   PT Frequency 2x / week   PT Duration 8 weeks   PT Treatment/Interventions ADLs/Self Care Home Management;Electrical Stimulation;Cryotherapy;Moist Heat;Therapeutic activities;Therapeutic exercise;Neuromuscular re-education;Patient/family education;Manual techniques;Passive range of motion;Dry needling;Taping;Vasopneumatic Device   PT Next Visit Plan review band exericses and progress RTC strength as tolerated, AROM, AAROM, PROM and manual as needed   Consulted and Agree with Plan of Care Patient      Patient will benefit from skilled therapeutic intervention in order to improve the following deficits and impairments:  Decreased activity tolerance, Decreased strength, Decreased range of motion, Increased fascial restricitons, Impaired UE functional use, Postural dysfunction, Increased edema  Visit Diagnosis: Acute pain of left shoulder  Muscle weakness (generalized)  Stiffness of left shoulder, not elsewhere classified     Problem List Patient Active Problem List   Diagnosis Date Noted  . Osteoarthritis of left knee 06/16/2015  . Status post total left knee replacement 06/16/2015  . MYALGIA 12/19/2009  . SUBSTANCE ABUSE 02/06/2009  . KNEE PAIN, LEFT 10/06/2008  . URI 07/11/2008  . ACUTE MAXILLARY SINUSITIS 03/17/2008  . ADD 09/14/2007  . COLONIC POLYPS, HX OF 08/03/2007  . Essential hypertension 01/20/2007  . OBESITY NOS 12/04/2006  . Allergic rhinitis 12/04/2006  . GASTROJEJUNOSTOMY, HX OF 12/04/2006  . BREAST BIOPSY, HX OF 12/04/2006     [name], PT 09/03/2016, 11:10 AM  [facility] [address] [place], [place], [address] Phone: [phone]   Fax:  [phone]  Name: Leslie [name] MRN: [id number] Date of Birth: 11-Feb-1951

## 2016-09-03 NOTE — Patient Instructions (Signed)
(  Clinic) Extension / Flexion (Assist)    Face pulley, right arm as far forward and up as is pain free. Pull arm down toward side. Repeat __10__ times per set. Do __10__ sets per session. Do __3-4__ sessions per week. Use ____ lb weights.   Copyright  VHI. All rights reserved.    (Clinic) Retraction: Row - Bilateral (Pulley)    Facing pulley, arms reaching forward, pull hands toward stomach, pinching shoulder blades together. Repeat __10__ times per set. Do __2__ sets per session. Do __3-4__ sessions per week. Use ____ lb weights.  Copyright  VHI. All rights reserved.    (Clinic) External Rotation: Resting Position    Opposite side toward pulley, squeeze roller to right side, elbow in, bent to 90, forearm across body. Rotate shoulder by pulling hand away from body. Keep elbow bent to 90, holding roller. Repeat __10__ times per set. Do _2___ sets per session. Do _3-4___ sessions per week.   Copyright  VHI. All rights reserved.   Arc: Flexion (90 Degrees)    Sit or stand with arms at sides, holding __0__ lb weights. Move arms forward and up to shoulder level, palms in. Only go as high as you can without lifting shoulder.  Do this in front of the mirror Repeat __20__ times. Do ____ sessions per day.  [email or web address].[email or web address]   Copyright  VHI. All rights reserved.

## 2016-09-04 ENCOUNTER — Other Ambulatory Visit: Payer: Self-pay | Admitting: Internal Medicine

## 2016-09-05 ENCOUNTER — Encounter: Payer: Medicare Other | Admitting: Physical Therapy

## 2016-09-09 ENCOUNTER — Ambulatory Visit: Payer: Medicare Other | Admitting: Physical Therapy

## 2016-09-10 ENCOUNTER — Encounter: Payer: Medicare Other | Admitting: Physical Therapy

## 2016-09-12 ENCOUNTER — Encounter: Payer: Self-pay | Admitting: Physical Therapy

## 2016-09-12 ENCOUNTER — Ambulatory Visit: Payer: Medicare Other | Admitting: Physical Therapy

## 2016-09-12 DIAGNOSIS — M25512 Pain in left shoulder: Secondary | ICD-10-CM

## 2016-09-12 DIAGNOSIS — M25612 Stiffness of left shoulder, not elsewhere classified: Secondary | ICD-10-CM

## 2016-09-12 DIAGNOSIS — M6281 Muscle weakness (generalized): Secondary | ICD-10-CM

## 2016-09-12 NOTE — Therapy (Addendum)
[facility] [address] [place], [place], [address] Phone: [phone]   Fax:  [phone]  Physical Therapy Treatment  Patient Details  Name: Leslie [name] MRN: [id number] Date of Birth: 08/08/1950 Referring Provider: Mcarthur Rossetti, MD  Encounter Date: 09/12/2016      PT End of Session - 09/12/16 1101    Visit Number 12   Number of Visits 20   Date for PT Re-Evaluation 10/18/16   Authorization Type mcare - gcodes at 20th visit   PT Start Time 1016   PT Stop Time 1105   PT Time Calculation (min) 49 min   Activity Tolerance Patient tolerated treatment well   Behavior During Therapy [facility] for tasks assessed/performed      Past Medical History:  Diagnosis Date  . ADD (attention deficit disorder)   . Allergy   . Arthritis 2016   osteoarthritis left knee  . Colon polyps   . Hypertension   . Left knee pain    chronic  . Narcotic addiction ([place])   . Obesity    post gastric bypass  . Vitamin D deficiency     Past Surgical History:  Procedure Laterality Date  . arthscopic knee surgery Left    several times  . BREAST SURGERY  years ago   breast biopsy  . CHOLECYSTECTOMY    . EYE SURGERY Bilateral 2013   Toric Lens implants  . EYE SURGERY Right 2014   corneal amniotic membrane  . GASTRIC BYPASS  2003  . KIDNEY DONATION  2004  . TOTAL KNEE ARTHROPLASTY Left 06/16/2015   Procedure: LEFT TOTAL KNEE ARTHROPLASTY;  Surgeon: Mcarthur Rossetti, MD;  Location: [facility];  Service: Orthopedics;  Laterality: Left;    There were no vitals filed for this visit.      Subjective Assessment - 09/12/16 1020    Subjective States she was doing the exercises a lot over the past week.  Was doing cane AAROM last night and states she felt and heard a loud pop on arm.  She is able to lift arm at the same level but states she is sore today.    Limitations House hold activities   Currently in Pain? Yes   Pain Score 3    Pain Location Shoulder   Pain Orientation Left;Anterior;Proximal   Pain Descriptors / Indicators Aching   Pain Type Surgical pain   Pain Onset More than a month ago   Pain Frequency Intermittent   Aggravating Factors  reaching overhead   Pain Relieving Factors rest   Effect of Pain on Daily Activities reaching overhead                         [facility] Adult PT Treatment/Exercise - 09/12/16 0001      Shoulder Exercises: Supine   Flexion AAROM;10 reps   ABduction AAROM;10 reps     Shoulder Exercises: Standing   External Rotation Strengthening;Left;10 reps;Theraband   Theraband Level (Shoulder External Rotation) Level 2 (Red)   Extension Strengthening;Both;20 reps;Theraband   Theraband Level (Shoulder Extension) Level 2 (Red)   Row Strengthening;Both;20 reps;Theraband   Theraband Level (Shoulder Row) Level 2 (Red)   Other Standing Exercises UE ranger on stairs  forward/ back side/side circles     Shoulder Exercises: ROM/Strengthening   UBE (Upper Arm Bike) L1 6x6 - fwd/back   Ball on Wall AAROM flexion     Shoulder Exercises: IT sales professional 3  reps;20 seconds     Manual Therapy   Manual therapy comments supine   Joint Mobilization A/P glenohumeral grade II-III   Soft tissue mobilization Lt deltoid, pec minor, upper trap   Passive ROM Lt shoulder ROM to tolerance                  PT Short Term Goals - 08/22/16 1025      PT SHORT TERM GOAL #1   Title be independent in initial HEP   Time 4   Period Weeks   Status Achieved     PT SHORT TERM GOAL #2   Title able to achieve 120 degrees of PROM flexion for improved functional reaching   Baseline 150   Time 4   Period Weeks   Status Achieved           PT Long Term Goals - 09/12/16 1101      PT LONG TERM GOAL #1   Title be independent in advanced HEP   Time 8   Period Weeks   Status On-going     PT LONG TERM GOAL #2   Title reduce FOTO to < or = to 37%  limitation   Time 8   Period Weeks   Status On-going     PT LONG TERM GOAL #3   Title able to lift 1 lb overhead with good scapular mobility for improved functional reaching during household tasks   Time 8   Period Weeks   Status On-going     PT LONG TERM GOAL #4   Title Lt shoulder flex and abduction strength 4+/5 without increased pain for improved functional reaching   Time 8   Period Weeks   Status On-going     PT LONG TERM GOAL #5   Title AROM Lt shoulder 160 degrees of flexion and abduction for improved functional reaching overhead   Time 8   Period Weeks   Status On-going     PT LONG TERM GOAL #6   Title able to perform household chores without increased pain in Lt shoulder   Time 8   Period Weeks   Status On-going               Plan - 09/12/16 1102    Clinical Impression Statement Was more inflamed today, but able to progress resistance of band exercises without increased pain.  Shoulder external rotation with band was most difficult exercise.  Continues to have soreness at anterior deltoid.  Ice at the end of treatment for inflammation. Skilled PT to continue with ROM and strengthening.   Rehab Potential Excellent   Clinical Impairments Affecting Rehab Potential Lt shoulder RTC repair   PT Frequency 2x / week   PT Duration 8 weeks   PT Treatment/Interventions ADLs/Self Care Home Management;Electrical Stimulation;Cryotherapy;Moist Heat;Therapeutic activities;Therapeutic exercise;Neuromuscular re-education;Patient/family education;Manual techniques;Passive range of motion;Dry needling;Taping;Vasopneumatic Device   PT Next Visit Plan flexion and abduction ROM, shoulder strength progress as tolerated   Consulted and Agree with Plan of Care Patient      Patient will benefit from skilled therapeutic intervention in order to improve the following deficits and impairments:  Decreased activity tolerance, Decreased strength, Decreased range of motion, Increased  fascial restricitons, Impaired UE functional use, Postural dysfunction, Increased edema  Visit Diagnosis: Acute pain of left shoulder  Muscle weakness (generalized)  Stiffness of left shoulder, not elsewhere classified     Problem List Patient Active Problem List   Diagnosis Date Noted  . Osteoarthritis of left  knee 06/16/2015  . Status post total left knee replacement 06/16/2015  . MYALGIA 12/19/2009  . SUBSTANCE ABUSE 02/06/2009  . KNEE PAIN, LEFT 10/06/2008  . URI 07/11/2008  . ACUTE MAXILLARY SINUSITIS 03/17/2008  . ADD 09/14/2007  . COLONIC POLYPS, HX OF 08/03/2007  . Essential hypertension 01/20/2007  . OBESITY NOS 12/04/2006  . Allergic rhinitis 12/04/2006  . GASTROJEJUNOSTOMY, HX OF 12/04/2006  . BREAST BIOPSY, HX OF 12/04/2006    Zannie Cove, PT 09/12/2016, 1:35 PM  [facility] [address], [place], [place], [address] Phone: [phone]   Fax:  [phone]  Name: IRIDESSA HARROW MRN: [id number] Date of Birth: Feb 19, 1951  PHYSICAL THERAPY DISCHARGE SUMMARY  Visits from Start of Care: 12  Current functional level related to goals / functional outcomes: See above details   Remaining deficits: See above details   Education / Equipment: HEP  Plan: Patient agrees to discharge.  Patient goals were not met. Patient is being discharged due to the physician's request.  ?????

## 2016-09-17 ENCOUNTER — Ambulatory Visit: Payer: Medicare Other | Admitting: Physical Therapy

## 2016-09-17 ENCOUNTER — Telehealth: Payer: Self-pay | Admitting: Physical Therapy

## 2016-09-17 NOTE — Telephone Encounter (Signed)
Called patient due to no show for appointment.  Left message to please call back.  Vincente PoliJakki Crosser, PT 09/17/16 10:37 AM

## 2016-09-19 ENCOUNTER — Encounter: Payer: Medicare Other | Admitting: Physical Therapy

## 2016-09-24 ENCOUNTER — Encounter: Payer: Medicare Other | Admitting: Physical Therapy

## 2016-09-25 ENCOUNTER — Ambulatory Visit (INDEPENDENT_AMBULATORY_CARE_PROVIDER_SITE_OTHER): Payer: Medicare Other | Admitting: Orthopaedic Surgery

## 2016-09-25 DIAGNOSIS — M25512 Pain in left shoulder: Secondary | ICD-10-CM | POA: Diagnosis not present

## 2016-09-25 DIAGNOSIS — G8929 Other chronic pain: Secondary | ICD-10-CM

## 2016-09-25 MED ORDER — LIDOCAINE HCL 1 % IJ SOLN
3.0000 mL | INTRAMUSCULAR | Status: AC | PRN
  Administered 2016-09-25: 3 mL

## 2016-09-25 MED ORDER — METHYLPREDNISOLONE ACETATE 40 MG/ML IJ SUSP
40.0000 mg | INTRAMUSCULAR | Status: AC | PRN
  Administered 2016-09-25: 40 mg via INTRA_ARTICULAR

## 2016-09-25 NOTE — Progress Notes (Signed)
Office Visit Note   Patient: Leslie [name]           Date of Birth: January 25, 1951           MRN: [id number] Visit Date: 09/25/2016              Requested by: Gordy Savers, MD [address] [place], [place] [address] PCP: Rogelia Boga, MD   Assessment & Plan: Visit Diagnoses:  1. Chronic left shoulder pain     Plan: I want her to completely stop physical therapy at this point. I did provide a steroid injection subacromial space. A well. I like see her back in 5 weeks to see how she doing overall.  Follow-Up Instructions: Return in about 5 weeks (around 10/30/2016).   Orders:  No orders of the defined types were placed in this encounter.  No orders of the defined types were placed in this encounter.     Procedures: Large Joint Inj Date/Time: 09/25/2016 3:35 PM Performed by: Kathryne Hitch Authorized by: Kathryne Hitch   Location:  Shoulder Ultrasound Guidance: No   Fluoroscopic Guidance: No   Arthrogram: No   Medications:  3 mL lidocaine 1 %; 40 mg methylPREDNISolone acetate 40 MG/ML     Clinical Data: No additional findings.   Subjective: No chief complaint on file. She is just over 3 months post a left shoulder arthroscopy and rotator cuff repair. She felt some type of pop in her shoulder earlier today and is hurt quite a bit. She's been going through extensive physical therapy and that is made things her low worse. She 66 years old.  HPI  Review of Systems   Objective: Vital Signs: There were no vitals taken for this visit.  Physical Exam She is alert and oriented 3 and in no acute distress Ortho Exam On examination of her left shoulder her rotator cuff itself appears to be intact. She can hold her arm abducted 90 and shows some strength of rotator cuff but is deathly painful. The shoulders well located. Specialty Comments:  No specialty comments available.  Imaging: No results found.   PMFS  History: Patient Active Problem List   Diagnosis Date Noted  . Osteoarthritis of left knee 06/16/2015  . Status post total left knee replacement 06/16/2015  . MYALGIA 12/19/2009  . SUBSTANCE ABUSE 02/06/2009  . KNEE PAIN, LEFT 10/06/2008  . URI 07/11/2008  . ACUTE MAXILLARY SINUSITIS 03/17/2008  . ADD 09/14/2007  . COLONIC POLYPS, HX OF 08/03/2007  . Essential hypertension 01/20/2007  . OBESITY NOS 12/04/2006  . Allergic rhinitis 12/04/2006  . GASTROJEJUNOSTOMY, HX OF 12/04/2006  . BREAST BIOPSY, HX OF 12/04/2006   Past Medical History:  Diagnosis Date  . ADD (attention deficit disorder)   . Allergy   . Arthritis 2016   osteoarthritis left knee  . Colon polyps   . Hypertension   . Left knee pain    chronic  . Narcotic addiction ([facility])   . Obesity    post gastric bypass  . Vitamin D deficiency     Family History  Problem Relation Age of Onset  . Heart disease Mother     post CABG history of CHF  . COPD Father   . Diabetes Sister   . Hypertension Sister     post renal transplant  . Heart disease Brother     CAD    Past Surgical History:  Procedure Laterality Date  . arthscopic knee surgery Left  several times  . BREAST SURGERY  years ago   breast biopsy  . CHOLECYSTECTOMY    . EYE SURGERY Bilateral 2013   Toric Lens implants  . EYE SURGERY Right 2014   corneal amniotic membrane  . GASTRIC BYPASS  2003  . KIDNEY DONATION  2004  . TOTAL KNEE ARTHROPLASTY Left 06/16/2015   Procedure: LEFT TOTAL KNEE ARTHROPLASTY;  Surgeon: Kathryne Hitchhristopher Y Blackman, MD;  Location: [facility];  Service: Orthopedics;  Laterality: Left;   Social History   Occupational History  . Not on file.   Social History Main Topics  . Smoking status: Former Smoker    Quit date: 07/22/1992  . Smokeless tobacco: Never Used  . Alcohol use Yes     Comment: once every 6 months  . Drug use: No  . Sexual activity: Not on file

## 2016-09-26 ENCOUNTER — Encounter: Payer: Medicare Other | Admitting: Physical Therapy

## 2016-10-14 ENCOUNTER — Other Ambulatory Visit (INDEPENDENT_AMBULATORY_CARE_PROVIDER_SITE_OTHER): Payer: Medicare Other

## 2016-10-14 ENCOUNTER — Other Ambulatory Visit: Payer: Medicare Other

## 2016-10-14 DIAGNOSIS — Z0000 Encounter for general adult medical examination without abnormal findings: Secondary | ICD-10-CM | POA: Diagnosis not present

## 2016-10-14 LAB — BASIC METABOLIC PANEL
BUN: 16 mg/dL (ref 6–23)
CALCIUM: 9.6 mg/dL (ref 8.4–10.5)
CHLORIDE: 101 meq/L (ref 96–112)
CO2: 29 meq/L (ref 19–32)
Creatinine, Ser: 1.13 mg/dL (ref 0.40–1.20)
GFR: 51.16 mL/min — ABNORMAL LOW (ref >60.00)
GLUCOSE: 106 mg/dL — AB (ref 70–99)
Potassium: 4.4 mEq/L (ref 3.5–5.1)
Sodium: 138 mEq/L (ref 135–145)

## 2016-10-14 LAB — POC URINALSYSI DIPSTICK (AUTOMATED)
Bilirubin, UA: NEGATIVE
Blood, UA: NEGATIVE
GLUCOSE UA: NEGATIVE
Ketones, UA: NEGATIVE
Leukocytes, UA: NEGATIVE
NITRITE UA: NEGATIVE
Protein, UA: NEGATIVE
SPEC GRAV UA: 1.025 (ref 1.030–1.035)
Urobilinogen, UA: 0.2 (ref ?–2.0)
pH, UA: 6 (ref 5.0–8.0)

## 2016-10-14 LAB — LIPID PANEL
CHOLESTEROL: 206 mg/dL — AB (ref 0–200)
HDL: 78.1 mg/dL (ref >39.00)
LDL Cholesterol: 117 mg/dL — ABNORMAL HIGH (ref 0–99)
NonHDL: 128.32
TRIGLYCERIDES: 57 mg/dL (ref 0.0–149.0)
Total CHOL/HDL Ratio: 3
VLDL: 11.4 mg/dL (ref 0.0–40.0)

## 2016-10-14 LAB — CBC WITH DIFFERENTIAL/PLATELET
BASOS ABS: 0.1 10*3/uL (ref 0.0–0.1)
BASOS PCT: 1.2 % (ref 0.0–3.0)
EOS PCT: 3.3 % (ref 0.0–5.0)
Eosinophils Absolute: 0.2 10*3/uL (ref 0.0–0.7)
HEMATOCRIT: 33 % — AB (ref 36.0–46.0)
Hemoglobin: 10.3 g/dL — ABNORMAL LOW (ref 12.0–15.0)
LYMPHS ABS: 1.1 10*3/uL (ref 0.7–4.0)
LYMPHS PCT: 22.9 % (ref 12.0–46.0)
MCHC: 31.2 g/dL (ref 30.0–36.0)
MCV: 71.8 fl — AB (ref 78.0–100.0)
MONOS PCT: 9 % (ref 3.0–12.0)
Monocytes Absolute: 0.4 10*3/uL (ref 0.1–1.0)
NEUTROS ABS: 3.1 10*3/uL (ref 1.4–7.7)
NEUTROS PCT: 63.6 % (ref 43.0–77.0)
PLATELETS: 334 10*3/uL (ref 150.0–400.0)
RBC: 4.6 Mil/uL (ref 3.87–5.11)
RDW: 16.8 % — AB (ref 11.5–15.5)
WBC: 4.9 10*3/uL (ref 4.0–10.5)

## 2016-10-14 LAB — HEPATIC FUNCTION PANEL
ALBUMIN: 4.3 g/dL (ref 3.5–5.2)
ALT: 11 U/L (ref 0–35)
AST: 12 U/L (ref 0–37)
Alkaline Phosphatase: 100 U/L (ref 39–117)
Bilirubin, Direct: 0.1 mg/dL (ref 0.0–0.3)
TOTAL PROTEIN: 6.5 g/dL (ref 6.0–8.3)
Total Bilirubin: 0.4 mg/dL (ref 0.2–1.2)

## 2016-10-14 LAB — TSH: TSH: 2.11 u[IU]/mL (ref 0.35–4.50)

## 2016-10-21 ENCOUNTER — Encounter: Payer: Medicare Other | Admitting: Internal Medicine

## 2016-10-30 ENCOUNTER — Ambulatory Visit (INDEPENDENT_AMBULATORY_CARE_PROVIDER_SITE_OTHER): Payer: Medicare Other | Admitting: Orthopaedic Surgery

## 2016-11-08 ENCOUNTER — Encounter: Payer: Self-pay | Admitting: Internal Medicine

## 2016-11-08 ENCOUNTER — Ambulatory Visit (INDEPENDENT_AMBULATORY_CARE_PROVIDER_SITE_OTHER): Payer: Medicare Other | Admitting: Internal Medicine

## 2016-11-08 VITALS — BP 132/68 | HR 72 | Temp 97.9°F | Ht 64.5 in | Wt 204.6 lb

## 2016-11-08 DIAGNOSIS — I10 Essential (primary) hypertension: Secondary | ICD-10-CM | POA: Diagnosis not present

## 2016-11-08 DIAGNOSIS — Z0000 Encounter for general adult medical examination without abnormal findings: Secondary | ICD-10-CM

## 2016-11-08 DIAGNOSIS — J301 Allergic rhinitis due to pollen: Secondary | ICD-10-CM

## 2016-11-08 NOTE — Progress Notes (Signed)
Subjective:    Patient ID: Leslie [name], female    DOB: 1951/02/28, 66 y.o.   MRN: [id number]  HPI 66 year old patient who is seen today for a wellness exam. She has history of hypertension which has been well-controlled.  She has remote history of substance abuse. Last colonoscopy 2006  She has a history of allergic rhinitis. She has history of obesity and status post a gastric bypass surgery.  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  She has a history of ADHD  Past Medical History:  Diagnosis Date  . ADD (attention deficit disorder)   . Allergy   . Arthritis 2016   osteoarthritis left knee  . Colon polyps   . Hypertension   . Left knee pain    chronic  . Narcotic addiction ([facility])   . Obesity    post gastric bypass  . Vitamin D deficiency      Social History   Social History  . Marital status: Single    Spouse name: N/A  . Number of children: N/A  . Years of education: N/A   Occupational History  . Not on file.   Social History Main Topics  . Smoking status: Former Smoker    Quit date: 07/22/1992  . Smokeless tobacco: Never Used  . Alcohol use Yes     Comment: once every 6 months  . Drug use: No  . Sexual activity: Not on file   Other Topics Concern  . Not on file   Social History Narrative  . No narrative on file    Past Surgical History:  Procedure Laterality Date  . arthscopic knee surgery Left    several times  . BREAST SURGERY  years ago   breast biopsy  . CHOLECYSTECTOMY    . EYE SURGERY Bilateral 2013   Toric Lens implants  . EYE SURGERY Right 2014   corneal amniotic membrane  . GASTRIC BYPASS  2003  . KIDNEY DONATION  2004  . TOTAL KNEE ARTHROPLASTY Left 06/16/2015   Procedure: LEFT TOTAL KNEE ARTHROPLASTY;  Surgeon: Kathryne Hitch, MD;  Location: [facility];  Service: Orthopedics;   Laterality: Left;    Family History  Problem Relation Age of Onset  . Heart disease Mother     post CABG history of CHF  . COPD Father   . Diabetes Sister   . Hypertension Sister     post renal transplant  . Heart disease Brother     CAD    Allergies  Allergen Reactions  . Sulfamethoxazole Nausea And Vomiting    See patient list of med intolerances due to h/o gastric bypass and nephrectomy    Current Outpatient Prescriptions on File Prior to Visit  Medication Sig Dispense Refill  . amphetamine-dextroamphetamine (ADDERALL XR) 30 MG 24 hr capsule Take 1 capsule (30 mg total) by mouth every morning. 30 capsule 0  . amphetamine-dextroamphetamine (ADDERALL XR) 30 MG 24 hr capsule Take 1 capsule (30 mg total) by mouth every morning. 30 capsule 0  . amphetamine-dextroamphetamine (ADDERALL XR) 30 MG 24 hr capsule Take 1 capsule (30 mg total) by mouth every morning. 30 capsule 0  . amphetamine-dextroamphetamine (ADDERALL) 20 MG tablet Take 1/2 to 1 tablet by mouth every evening if needed. 30 tablet 0  . amphetamine-dextroamphetamine (ADDERALL) 20 MG tablet Take 1/2 to 1 tablet by mouth every evening if needed.  30 tablet 0  . amphetamine-dextroamphetamine (ADDERALL) 20 MG tablet take 1/2 to 1 tablet by mouth every evening if needed 30 tablet 0  . Ascorbic Acid (VITAMIN C) 1000 MG tablet Take 1,000 mg by mouth daily.     . calcium-vitamin D (SM CALCIUM 500/VITAMIN D3) 500-400 MG-UNIT tablet Take 1 tablet by mouth daily.     Marland Kitchen FLUoxetine (PROZAC) 40 MG capsule take 1 capsule by mouth once daily 90 capsule 1  . gabapentin (NEURONTIN) 300 MG capsule take 1 capsule by mouth once daily 90 capsule 1  . hydrochlorothiazide (HYDRODIURIL) 25 MG tablet take 1 tablet by mouth once daily 90 tablet 3  . lamoTRIgine (LAMICTAL) 100 MG tablet take 1 tablet by mouth twice a day 60 tablet 5  . triamcinolone (NASACORT) 55 MCG/ACT nasal inhaler Place 2 sprays into the nose daily. (Patient taking differently:  Place 2 sprays into the nose daily as needed (allergies). ) 1 Inhaler 6  . zolpidem (AMBIEN) 10 MG tablet take 1 tablet by mouth once daily at bedtime if needed for sleep 30 tablet 4   No current facility-administered medications on file prior to visit.     BP 132/68 (BP Location: Left Arm, Patient Position: Sitting, Cuff Size: Normal)   Pulse 72   Temp 97.9 F (36.6 C) (Oral)   Ht 5' 4.5" (1.638 m)   Wt 204 lb 9.6 oz (92.8 kg)   SpO2 97%   BMI 34.58 kg/m   Medicare wellness visit  1. Risk factors, based on past  M,S,F history.  Current vascular risk factors include a history of hypertension  2.  Physical activities: has done well status post left total knee replacement surgery.  Walks her dog almost daily a proximally 1 mile  3.  Depression/mood:no history of major depression or mood disorder.  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  Hearing:no deficits  5.  ADL's:independent  6.  Fall risk:low  7.  Home safety:no problems identified  8.  Height weight, and visual acuity;height and weight stable no change in visual acuity 9.  Counseling:continue efforts at weight loss and more rigorous exercise.  Daily iron supplements encouraged  10. Lab orders based on risk factors:laboratory profile reviewed.  Fairly unremarkable except for iron deficiency anemia  11. Referral :needs follow-up colonoscopy  12. Care plan:continue efforts at aggressive risk factor modification 13. Cognitive assessment: alert and oriented with normal affect no cognitive dysfunction  14. Screening: Patient provided with a written and personalized 5-10 year screening schedule in the AVS.    15. Provider List Update: primary care orthopedics and GI and radiology  Review of Systems  Constitutional: Negative.   HENT: Negative for congestion, dental problem, hearing loss, rhinorrhea, sinus pressure, sore throat and tinnitus.   Eyes: Negative for pain, discharge and visual  disturbance.  Respiratory: Negative for cough and shortness of breath.   Cardiovascular: Negative for chest pain, palpitations and leg swelling.  Gastrointestinal: Negative for abdominal distention, abdominal pain, blood in stool, constipation, diarrhea, nausea and vomiting.  Genitourinary: Negative for difficulty urinating, dysuria, flank pain, frequency, hematuria, pelvic pain, urgency, vaginal bleeding, vaginal discharge and vaginal pain.  Musculoskeletal: Negative for arthralgias, gait problem and joint swelling.  Skin: Negative for rash.  Neurological: Negative for dizziness, syncope, speech difficulty, weakness, numbness and headaches.  Hematological: Negative for adenopathy.  Psychiatric/Behavioral: Negative for agitation, behavioral problems and dysphoric mood. The patient is not nervous/anxious.        Objective:  Physical Exam  Constitutional: She is oriented to person, place, and time. She appears well-developed and well-nourished.  Overweight Blood pressure well controlled  HENT:  Head: Normocephalic and atraumatic.  Right Ear: External ear normal.  Left Ear: External ear normal.  Mouth/Throat: Oropharynx is clear and moist.  Eyes: Conjunctivae and EOM are normal.  Neck: Normal range of motion. Neck supple. No JVD present. No thyromegaly present.  Cardiovascular: Normal rate, regular rhythm, normal heart sounds and intact distal pulses.   No murmur heard. Pulmonary/Chest: Effort normal and breath sounds normal. She has no wheezes. She has no rales.  Abdominal: Soft. Bowel sounds are normal. She exhibits no distension and no mass. There is no tenderness. There is no rebound and no guarding.  Musculoskeletal: Normal range of motion. She exhibits no edema or tenderness.  Neurological: She is alert and oriented to person, place, and time. She has normal reflexes. No cranial nerve deficit. She exhibits normal muscle tone. Coordination normal.  Skin: Skin is warm and dry. No  rash noted.  Right upper quadrant scar Status post left total knee replacement surgery  Psychiatric: She has a normal mood and affect. Her behavior is normal.          Assessment & Plan:   Preventive health.  We'll schedule follow-up colonoscopy Essential hypertension, stable Medicare wellness visit ADHD Iron deficiency anemia.  Compliance with iron supplements encouraged  Follow-up 6 months  KWIATKOWSKI,PETER Homero Fellers

## 2016-11-08 NOTE — Progress Notes (Signed)
Pre visit review using our clinic review tool, if applicable. No additional management support is needed unless otherwise documented below in the visit note. 

## 2016-11-08 NOTE — Patient Instructions (Addendum)
WE NOW OFFER   Leslie [name]'s FAST TRACK!!!  SAME DAY Appointments for ACUTE CARE  Such as: Sprains, Injuries, cuts, abrasions, rashes, muscle pain, joint pain, back pain Colds, flu, sore throats, headache, allergies, cough, fever  Ear pain, sinus and eye infections Abdominal pain, nausea, vomiting, diarrhea, upset stomach Animal/insect bites  3 Easy Ways to Schedule: Walk-In Scheduling Call in scheduling Mychart Sign-up: [email or web address]  Take an iron supplement daily    It is important that you exercise regularly, at least 20 minutes 3 to 4 times per week.  If you develop chest pain or shortness of breath seek  medical attention.  Take a calcium supplement, plus [phone] units of vitamin D    Schedule your colonoscopy to help detect colon cancer.

## 2016-11-19 ENCOUNTER — Telehealth: Payer: Self-pay | Admitting: Internal Medicine

## 2016-11-19 MED ORDER — AMPHETAMINE-DEXTROAMPHET ER 30 MG PO CP24: 30.0000 mg | ORAL_CAPSULE | ORAL | 0 refills | Status: DC

## 2016-11-19 MED ORDER — AMPHETAMINE-DEXTROAMPHETAMINE 20 MG PO TABS: ORAL_TABLET | ORAL | 0 refills | Status: DC

## 2016-11-19 NOTE — Telephone Encounter (Signed)
° ° ° ° ° °  Pt request refill of the following: ° °amphetamine-dextroamphetamine (ADDERALL XR) 30 MG 24 hr capsule ° ° °Phamacy: °

## 2016-11-19 NOTE — Telephone Encounter (Signed)
° °  Pt need both   The  and the   amphetamine-dextroamphetamine (ADDERALL) 20 MG tablet

## 2016-11-20 NOTE — Telephone Encounter (Signed)
Pt notified Rx ready for pickup. Rx printed and signed.  

## 2016-12-23 ENCOUNTER — Encounter: Payer: Self-pay | Admitting: Internal Medicine

## 2017-01-06 ENCOUNTER — Other Ambulatory Visit: Payer: Self-pay | Admitting: Internal Medicine

## 2017-01-17 ENCOUNTER — Other Ambulatory Visit: Payer: Self-pay

## 2017-01-17 ENCOUNTER — Telehealth: Payer: Self-pay | Admitting: Internal Medicine

## 2017-01-17 MED ORDER — AMPHETAMINE-DEXTROAMPHET ER 30 MG PO CP24: 30.0000 mg | ORAL_CAPSULE | ORAL | 0 refills | Status: DC

## 2017-01-17 NOTE — Telephone Encounter (Signed)
Patient notified that rx is up front ready to be picked up

## 2017-01-17 NOTE — Telephone Encounter (Signed)
Please advise 

## 2017-01-17 NOTE — Telephone Encounter (Signed)
Rx has been printed and signed.

## 2017-01-17 NOTE — Telephone Encounter (Signed)
[place] amphetamine-dextroamphetamine (ADDERALL XR) 30 MG 24 hr capsule  Pt's 3rd rx cannot be filled until 7/13. Pt would like to know if you will right a 14 day to get her through?

## 2017-01-17 NOTE — Telephone Encounter (Signed)
Okay for 2 weeks supply

## 2017-01-31 ENCOUNTER — Other Ambulatory Visit: Payer: Self-pay | Admitting: Internal Medicine

## 2017-02-18 ENCOUNTER — Telehealth: Payer: Self-pay | Admitting: Internal Medicine

## 2017-02-18 NOTE — Telephone Encounter (Signed)
° ° °  Pt request refill of the following: ° °amphetamine-dextroamphetamine (ADDERALL XR) 30 MG 24 hr capsule ° °amphetamine-dextroamphetamine (ADDERALL) 20 MG tablet ° ° °Phamacy:  °

## 2017-02-19 MED ORDER — AMPHETAMINE-DEXTROAMPHETAMINE 20 MG PO TABS: ORAL_TABLET | ORAL | 0 refills | Status: DC

## 2017-02-19 MED ORDER — AMPHETAMINE-DEXTROAMPHET ER 30 MG PO CP24: 30.0000 mg | ORAL_CAPSULE | ORAL | 0 refills | Status: DC

## 2017-02-19 NOTE — Telephone Encounter (Signed)
Rx printed awaiting to be signed.  

## 2017-02-19 NOTE — Telephone Encounter (Signed)
Pt notified via voice message that Rx is ready for pickup. Rx printed and signed. 

## 2017-03-06 ENCOUNTER — Other Ambulatory Visit: Payer: Self-pay | Admitting: Internal Medicine

## 2017-03-06 MED ORDER — AMPHETAMINE-DEXTROAMPHET ER 30 MG PO CP24: 30.0000 mg | ORAL_CAPSULE | ORAL | 0 refills | Status: DC

## 2017-03-08 ENCOUNTER — Other Ambulatory Visit: Payer: Self-pay | Admitting: Internal Medicine

## 2017-03-10 ENCOUNTER — Telehealth (INDEPENDENT_AMBULATORY_CARE_PROVIDER_SITE_OTHER): Payer: Self-pay | Admitting: Orthopaedic Surgery

## 2017-03-10 NOTE — Telephone Encounter (Signed)
Returned call to patient left message to call back. 

## 2017-03-31 ENCOUNTER — Ambulatory Visit (INDEPENDENT_AMBULATORY_CARE_PROVIDER_SITE_OTHER): Payer: Medicare Other

## 2017-03-31 ENCOUNTER — Ambulatory Visit (INDEPENDENT_AMBULATORY_CARE_PROVIDER_SITE_OTHER): Payer: Medicare Other | Admitting: Orthopaedic Surgery

## 2017-03-31 ENCOUNTER — Encounter (INDEPENDENT_AMBULATORY_CARE_PROVIDER_SITE_OTHER): Payer: Self-pay | Admitting: Orthopaedic Surgery

## 2017-03-31 DIAGNOSIS — M25511 Pain in right shoulder: Secondary | ICD-10-CM

## 2017-03-31 MED ORDER — METHYLPREDNISOLONE ACETATE 40 MG/ML IJ SUSP
40.0000 mg | INTRAMUSCULAR | Status: AC | PRN
  Administered 2017-03-31: 40 mg via INTRA_ARTICULAR

## 2017-03-31 MED ORDER — LIDOCAINE HCL 1 % IJ SOLN
3.0000 mL | INTRAMUSCULAR | Status: AC | PRN
  Administered 2017-03-31: 3 mL

## 2017-03-31 NOTE — Progress Notes (Signed)
Office Visit Note   Patient: Leslie [name]           Date of Birth: August 09, 1950           MRN: [id number] Visit Date: 03/31/2017              Requested by: Gordy Savers, MD [address] [place], [place] [address] PCP: Gordy Savers, MD   Assessment & Plan: Visit Diagnoses:  1. Acute pain of right shoulder     Plan: Given the fact that she's had a full-thickness rotator cuff tear on the left side before that did well with surgery and had a normal exam other than pain and slight weakness with findings more significant of her MRI she may end up needing an MRI of her right shoulder. However we will try steroid injection first to see how she does. She is agreeable to this plan as well.  Follow-Up Instructions: No Follow-up on file.   Orders:  Orders Placed This Encounter  Procedures  . Large Joint Injection/Arthrocentesis  . XR Shoulder Right   No orders of the defined types were placed in this encounter.     Procedures: Large Joint Inj Date/Time: 03/31/2017 3:47 PM Performed by: Kathryne Hitch Authorized by: Kathryne Hitch   Location:  Shoulder Site:  R subacromial bursa Ultrasound Guidance: No   Fluoroscopic Guidance: No   Arthrogram: No   Medications:  3 mL lidocaine 1 %; 40 mg methylPREDNISolone acetate 40 MG/ML     Clinical Data: No additional findings.   Subjective: Chief Complaint  Patient presents with  . Right Shoulder - Pain  Patient well-known to me. She comes in with acute right shoulder pain after feeling a pop in her shoulder about 5 days ago. She had weakness and difficulty moving that shoulder since she came in today for further evaluation and treatment. She is feeling slightly better though and she was 5 days ago. She has a history of a left rotator cuff repair that we performed earlier this year. She does state of the right shoulder still slightly weak.  HPI  Review of Systems She currently denies any  headache, chest pain, shortness of breath, fever, chills, nausea, vomiting.  Objective: Vital Signs: There were no vitals taken for this visit.  Physical Exam She is alert and oriented 3 and in no acute distress Ortho Exam Examination of both shoulder show she's got excellent range of motion but is definitely painful. She can abduct actually her right shoulder shoulder without difficulty.. Her right shoulder external rotation is weak. Her internal rotation with adduction is normal. Specialty Comments:  No specialty comments available.  Imaging: Xr Shoulder Right  Result Date: 03/31/2017 3 views of her right shoulder show no acute findings. The shoulders well located. She has significant acromioclavicular arthritic changes.    PMFS History: Patient Active Problem List   Diagnosis Date Noted  . Osteoarthritis of left knee 06/16/2015  . Status post total left knee replacement 06/16/2015  . MYALGIA 12/19/2009  . SUBSTANCE ABUSE 02/06/2009  . KNEE PAIN, LEFT 10/06/2008  . URI 07/11/2008  . ACUTE MAXILLARY SINUSITIS 03/17/2008  . ADD 09/14/2007  . COLONIC POLYPS, HX OF 08/03/2007  . Essential hypertension 01/20/2007  . OBESITY NOS 12/04/2006  . Allergic rhinitis 12/04/2006  . GASTROJEJUNOSTOMY, HX OF 12/04/2006  . BREAST BIOPSY, HX OF 12/04/2006   Past Medical History:  Diagnosis Date  . ADD (attention deficit disorder)   . Allergy   .  Arthritis 2016   osteoarthritis left knee  . Colon polyps   . Hypertension   . Left knee pain    chronic  . Narcotic addiction (HCC)   . Obesity    post gastric bypass  . Vitamin D deficiency     Family History  Problem Relation Age of Onset  . Heart disease Mother        post CABG history of CHF  . COPD Father   . Diabetes Sister   . Hypertension Sister        post renal transplant  . Heart disease Brother        CAD    Past Surgical History:  Procedure Laterality Date  . arthscopic knee surgery Left    several times  .  BREAST SURGERY  years ago   breast biopsy  . CHOLECYSTECTOMY    . EYE SURGERY Bilateral 2013   Toric Lens implants  . EYE SURGERY Right 2014   corneal amniotic membrane  . GASTRIC BYPASS  2003  . KIDNEY DONATION  2004  . TOTAL KNEE ARTHROPLASTY Left 06/16/2015   Procedure: LEFT TOTAL KNEE ARTHROPLASTY;  Surgeon: Kathryne Hitchhristopher Y Marcell Chavarin, MD;  Location: [facility];  Service: Orthopedics;  Laterality: Left;   Social History   Occupational History  . Not on file.   Social History Main Topics  . Smoking status: Former Smoker    Quit date: 07/22/1992  . Smokeless tobacco: Never Used  . Alcohol use Yes     Comment: once every 6 months  . Drug use: No  . Sexual activity: Not on file

## 2017-04-10 ENCOUNTER — Encounter: Payer: Self-pay | Admitting: Internal Medicine

## 2017-04-10 ENCOUNTER — Other Ambulatory Visit (INDEPENDENT_AMBULATORY_CARE_PROVIDER_SITE_OTHER): Payer: Self-pay

## 2017-04-10 ENCOUNTER — Telehealth (INDEPENDENT_AMBULATORY_CARE_PROVIDER_SITE_OTHER): Payer: Self-pay | Admitting: Orthopaedic Surgery

## 2017-04-10 DIAGNOSIS — M25511 Pain in right shoulder: Principal | ICD-10-CM

## 2017-04-10 DIAGNOSIS — G8929 Other chronic pain: Secondary | ICD-10-CM

## 2017-04-10 MED ORDER — ACETAMINOPHEN-CODEINE #3 300-30 MG PO TABS: 1.0000 | ORAL_TABLET | Freq: Two times a day (BID) | ORAL | 0 refills | Status: DC | PRN

## 2017-04-10 NOTE — Telephone Encounter (Signed)
Pt request to have MRI   Pt also request something for pain

## 2017-04-10 NOTE — Telephone Encounter (Signed)
Meds ordered an MRI of her right shoulder to rule out rotator cuff tear. Also see if she would rather have Tylenol 3 or tramadol. Neither of these can be 1-2 every 12 hours as needed for pain #60 no refills.

## 2017-04-10 NOTE — Telephone Encounter (Signed)
Please advise 

## 2017-04-10 NOTE — Telephone Encounter (Signed)
Called into pharmacy  Patient aware Order put in for MRI

## 2017-04-16 ENCOUNTER — Telehealth (INDEPENDENT_AMBULATORY_CARE_PROVIDER_SITE_OTHER): Payer: Self-pay | Admitting: Orthopaedic Surgery

## 2017-04-16 NOTE — Telephone Encounter (Signed)
Pt needs different pain management medicine( Tyneol  3 is not working and making Pt sick)  Pt needs to know if the MRI order is complete

## 2017-04-16 NOTE — Telephone Encounter (Signed)
I think this was supposed to come to you 

## 2017-04-16 NOTE — Telephone Encounter (Signed)
LMOM for patient that MRI is in process and she may try Tramadol if she would like

## 2017-04-17 ENCOUNTER — Other Ambulatory Visit (INDEPENDENT_AMBULATORY_CARE_PROVIDER_SITE_OTHER): Payer: Self-pay

## 2017-04-17 ENCOUNTER — Telehealth (INDEPENDENT_AMBULATORY_CARE_PROVIDER_SITE_OTHER): Payer: Self-pay | Admitting: Orthopaedic Surgery

## 2017-04-17 MED ORDER — TRAMADOL HCL 50 MG PO TABS: 50.0000 mg | ORAL_TABLET | Freq: Three times a day (TID) | ORAL | 0 refills | Status: DC | PRN

## 2017-04-17 NOTE — Telephone Encounter (Signed)
Called into pharmacy

## 2017-04-17 NOTE — Telephone Encounter (Signed)
Patient called advised she would like to have the Rx for (Tramadol) called in for her. The number to contact patient is [phone]

## 2017-04-26 ENCOUNTER — Ambulatory Visit
Admission: RE | Admit: 2017-04-26 | Discharge: 2017-04-26 | Disposition: A | Discharge status: Home / Self Care | Payer: Medicare Other | Source: Ambulatory Visit | Attending: Orthopaedic Surgery | Admitting: Orthopaedic Surgery

## 2017-04-26 DIAGNOSIS — M25511 Pain in right shoulder: Principal | ICD-10-CM

## 2017-04-26 DIAGNOSIS — G8929 Other chronic pain: Secondary | ICD-10-CM

## 2017-04-29 ENCOUNTER — Other Ambulatory Visit (INDEPENDENT_AMBULATORY_CARE_PROVIDER_SITE_OTHER): Payer: Self-pay

## 2017-04-29 ENCOUNTER — Ambulatory Visit (INDEPENDENT_AMBULATORY_CARE_PROVIDER_SITE_OTHER): Payer: Medicare Other | Admitting: Orthopaedic Surgery

## 2017-04-29 DIAGNOSIS — G8929 Other chronic pain: Secondary | ICD-10-CM

## 2017-04-29 DIAGNOSIS — M75121 Complete rotator cuff tear or rupture of right shoulder, not specified as traumatic: Secondary | ICD-10-CM | POA: Diagnosis not present

## 2017-04-29 DIAGNOSIS — M25511 Pain in right shoulder: Principal | ICD-10-CM

## 2017-04-29 MED ORDER — HYDROCODONE-ACETAMINOPHEN 5-325 MG PO TABS: 1.0000 | ORAL_TABLET | Freq: Two times a day (BID) | ORAL | 0 refills | Status: DC | PRN

## 2017-04-29 NOTE — Progress Notes (Signed)
The patient is well-known to me. She is following up after MRI of her right shoulder. She's had a previous full-thickness rotator cuff tear on the left side that we have repaired arthroscopically and she's done well with that shoulder. She 66 years old and right shoulders her chronically. She has tried and failed all forms of conservative treatments we sent her for an MRI given her history on the left side. She is denied any trauma to that right side. It still hurts significantly with activities but she is trying to get mobile.  On examination right shoulder she has excellent range of motion but is quite painful to her. There is weakness with abduction and external rotation.  MRI does confirm a full-thickness retracted rotator cuff tear of her right shoulder supraspinatus and infraspinatus tendons of the rotator cuff. She has significant synovitis of the glenohumeral joint as well.  We are recommending arthroscopic intervention on her right shoulder but she like to delay this to the first the year since she is taking care of her daughter's newborn child. I do feel that she'll benefit from an intra-articular steroid injection by Dr. Alvester Morin in her right shoulder and we'll work on getting this set up. I will see her back myself in 2 months. All questions and concerns were answered and addressed. 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

## 2017-05-06 ENCOUNTER — Telehealth (INDEPENDENT_AMBULATORY_CARE_PROVIDER_SITE_OTHER): Payer: Self-pay | Admitting: Orthopaedic Surgery

## 2017-05-06 ENCOUNTER — Other Ambulatory Visit (INDEPENDENT_AMBULATORY_CARE_PROVIDER_SITE_OTHER): Payer: Self-pay | Admitting: Orthopaedic Surgery

## 2017-05-06 MED ORDER — HYDROCODONE-ACETAMINOPHEN 5-325 MG PO TABS: 1.0000 | ORAL_TABLET | Freq: Two times a day (BID) | ORAL | 0 refills | Status: DC | PRN

## 2017-05-06 NOTE — Telephone Encounter (Signed)
Advise

## 2017-05-06 NOTE — Telephone Encounter (Signed)
LMOM for patient Rx ready at front desk 

## 2017-05-06 NOTE — Telephone Encounter (Signed)
She can come and pick up one more prescription but she needs to try to use these sparingly because this is not a medication we can keep her on long-term.

## 2017-05-06 NOTE — Telephone Encounter (Signed)
Patient called asking for a refill on hydrocodone. CB # [id number] Patient does not have appointment with Dr. Alvester Morin until next week and she says she is in constant pain and was wanting more pain medication to hold her over.

## 2017-05-12 ENCOUNTER — Ambulatory Visit (INDEPENDENT_AMBULATORY_CARE_PROVIDER_SITE_OTHER): Payer: Medicare Other | Admitting: Physical Medicine and Rehabilitation

## 2017-05-12 ENCOUNTER — Ambulatory Visit (INDEPENDENT_AMBULATORY_CARE_PROVIDER_SITE_OTHER): Payer: Self-pay

## 2017-05-12 ENCOUNTER — Encounter (INDEPENDENT_AMBULATORY_CARE_PROVIDER_SITE_OTHER): Payer: Self-pay | Admitting: Physical Medicine and Rehabilitation

## 2017-05-12 VITALS — BP 138/91 | HR 82

## 2017-05-12 DIAGNOSIS — M25511 Pain in right shoulder: Secondary | ICD-10-CM

## 2017-05-12 DIAGNOSIS — G8929 Other chronic pain: Secondary | ICD-10-CM | POA: Diagnosis not present

## 2017-05-12 NOTE — Progress Notes (Deleted)
Patient stated she's been having R shoulder pain  That's been very intensed.

## 2017-05-12 NOTE — Progress Notes (Signed)
Leslie [name] - 66 y.o. female MRN [id number]  Date of birth: [birth year]  Office Visit Note: Visit Date: 05/12/2017 PCP: Gordy Savers, MD Referred by: Gordy Savers, MD  Subjective: Chief Complaint  Patient presents with  . Injections    R shoulder   HPI: Leslie [name] is a 66 year old female with rotator cuff tear and chronic right shoulder pain. She did not get much relief with prior subacromial injection is referred here for intra-articular therapeutic and diagnostic arthrogram of the right glenohumeral joint.    ROS Otherwise per HPI.  Assessment & Plan: Visit Diagnoses:  1. Chronic right shoulder pain     Plan: Findings:  Diagnostic and hopefully therapeutic anesthetic arthrogram of the musculature on the right. Patient has significant relief during the anesthetic phase.    Meds & Orders: No orders of the defined types were placed in this encounter.   Orders Placed This Encounter  Procedures  . Large Joint Injection/Arthrocentesis  . XR C-ARM NO REPORT    Follow-up: Return if symptoms worsen or fail to improve, for Dr. Magnus Ivan.   Procedures: Diagnostic and therapeutic anesthetic glenohumeral joint arthrogram Date/Time: 05/12/2017 1:36 PM Performed by: Tyrell Antonio Authorized by: Tyrell Antonio   Consent Given by:  Patient Site marked: the procedure site was marked   Timeout: prior to procedure the correct patient, procedure, and site was verified   Indications:  Pain and diagnostic evaluation Location:  Shoulder Site:  R glenohumeral Prep: patient was prepped and draped in usual sterile fashion   Needle Size:  22 G Needle Length:  3.5 inches Approach:  Anteromedial Ultrasound Guidance: No   Fluoroscopic Guidance: No   Arthrogram: Yes   Medications:  3 mL bupivacaine 0.5 %; 80 mg triamcinolone acetonide 40 MG/ML Aspiration Attempted: No   Patient tolerance:  Patient tolerated the procedure well with no immediate complications  Arthrogram demonstrated excellent flow of contrast throughout the joint surface without extravasation or obvious defect.  The patient had relief of symptoms during the anesthetic phase of the injection.       No notes on file   Clinical History: No specialty comments available.  She reports that she quit smoking about 24 years ago. She has never used smokeless tobacco. No results for input(s): HGBA1C, LABURIC in the last 8760 hours.  Objective:  VS:  HT:    WT:   BMI:     BP:(!) 138/91  HR:82bpm  TEMP: ( )  RESP:97 % Physical Exam  Musculoskeletal:  Patient has positive drop arm test but with decent range of motion pain at end ranges.    Ortho Exam Imaging: Xr C-arm No Report  Result Date: 05/12/2017 Please see Notes or Procedures tab for imaging impression.   Past Medical/Family/Surgical/Social History: Medications & Allergies reviewed per EMR Patient Active Problem List   Diagnosis Date Noted  . Complete tear of right rotator cuff 04/29/2017  . Osteoarthritis of left knee 06/16/2015  . Status post total left knee replacement 06/16/2015  . MYALGIA 12/19/2009  . SUBSTANCE ABUSE 02/06/2009  . KNEE PAIN, LEFT 10/06/2008  . URI 07/11/2008  . ACUTE MAXILLARY SINUSITIS 03/17/2008  . ADD 09/14/2007  . COLONIC POLYPS, HX OF 08/03/2007  . Essential hypertension 01/20/2007  . OBESITY NOS 12/04/2006  . Allergic rhinitis 12/04/2006  . GASTROJEJUNOSTOMY, HX OF 12/04/2006  . BREAST BIOPSY, HX OF 12/04/2006   Past Medical History:  Diagnosis Date  . ADD (attention deficit disorder)   . Allergy   .  Arthritis 2016   osteoarthritis left knee  . Colon polyps   . Hypertension   . Left knee pain    chronic  . Narcotic addiction (HCC)   . Obesity    post gastric bypass  . Vitamin D deficiency    Family History  Problem Relation Age of Onset  . Heart disease Mother        post CABG history of CHF  . COPD Father   . Diabetes Sister   . Hypertension Sister        post  renal transplant  . Heart disease Brother        CAD   Past Surgical History:  Procedure Laterality Date  . arthscopic knee surgery Left    several times  . BREAST SURGERY  years ago   breast biopsy  . CHOLECYSTECTOMY    . EYE SURGERY Bilateral 2013   Toric Lens implants  . EYE SURGERY Right 2014   corneal amniotic membrane  . GASTRIC BYPASS  2003  . KIDNEY DONATION  2004  . TOTAL KNEE ARTHROPLASTY Left 06/16/2015   Procedure: LEFT TOTAL KNEE ARTHROPLASTY;  Surgeon: Kathryne Hitchhristopher Y Blackman, MD;  Location: [facility];  Service: Orthopedics;  Laterality: Left;   Social History   Occupational History  . Not on file.   Social History Main Topics  . Smoking status: Former Smoker    Quit date: 07/22/1992  . Smokeless tobacco: Never Used  . Alcohol use Yes     Comment: once every 6 months  . Drug use: No  . Sexual activity: Not on file

## 2017-05-12 NOTE — Patient Instructions (Signed)

## 2017-05-13 MED ORDER — TRIAMCINOLONE ACETONIDE 40 MG/ML IJ SUSP
80.0000 mg | INTRAMUSCULAR | Status: AC | PRN
  Administered 2017-05-12: 80 mg via INTRA_ARTICULAR

## 2017-05-13 MED ORDER — BUPIVACAINE HCL 0.5 % IJ SOLN
3.0000 mL | INTRAMUSCULAR | Status: AC | PRN
  Administered 2017-05-12: 3 mL via INTRA_ARTICULAR

## 2017-05-20 ENCOUNTER — Telehealth: Payer: Self-pay | Admitting: Internal Medicine

## 2017-05-20 MED ORDER — AMPHETAMINE-DEXTROAMPHETAMINE 20 MG PO TABS: ORAL_TABLET | ORAL | 0 refills | Status: DC

## 2017-05-20 MED ORDER — AMPHETAMINE-DEXTROAMPHET ER 30 MG PO CP24: 30.0000 mg | ORAL_CAPSULE | ORAL | 0 refills | Status: DC

## 2017-05-20 NOTE — Telephone Encounter (Signed)
Pt request refill °amphetamine-dextroamphetamine (ADDERALL XR) 30 MG 24 hr capsule °amphetamine-dextroamphetamine (ADDERALL) 20 MG tablet °3 mo supply °

## 2017-05-20 NOTE — Telephone Encounter (Signed)
Rx printed, awaiting to be signed  

## 2017-05-21 NOTE — Telephone Encounter (Signed)
Pt notified Rx ready for pickup. Rx printed and signed.  

## 2017-06-11 ENCOUNTER — Other Ambulatory Visit: Payer: Self-pay | Admitting: Internal Medicine

## 2017-06-30 ENCOUNTER — Ambulatory Visit (INDEPENDENT_AMBULATORY_CARE_PROVIDER_SITE_OTHER): Payer: Medicare Other | Admitting: Orthopaedic Surgery

## 2017-07-08 ENCOUNTER — Other Ambulatory Visit: Payer: Self-pay | Admitting: Internal Medicine

## 2017-07-08 ENCOUNTER — Ambulatory Visit (INDEPENDENT_AMBULATORY_CARE_PROVIDER_SITE_OTHER): Payer: Medicare Other | Admitting: Orthopaedic Surgery

## 2017-07-16 ENCOUNTER — Other Ambulatory Visit: Payer: Self-pay | Admitting: Internal Medicine

## 2017-07-28 ENCOUNTER — Ambulatory Visit (INDEPENDENT_AMBULATORY_CARE_PROVIDER_SITE_OTHER): Payer: Medicare Other | Admitting: Orthopaedic Surgery

## 2017-07-30 ENCOUNTER — Ambulatory Visit (INDEPENDENT_AMBULATORY_CARE_PROVIDER_SITE_OTHER): Payer: Medicare Other | Admitting: Orthopaedic Surgery

## 2017-08-06 ENCOUNTER — Other Ambulatory Visit: Payer: Self-pay | Admitting: Internal Medicine

## 2017-08-06 NOTE — Telephone Encounter (Signed)
May be refilled after 08/09/17. Rx must last 30 day may not be refilled early.

## 2017-08-07 NOTE — Telephone Encounter (Signed)
Copied from CRM [id number]. Topic: Quick Communication - Rx Refill/Question >> Aug 07, 2017  6:47 PM Leslie BergeronBarksdale, Leslie B wrote: Medication: zolpidem (AMBIEN) 10 MG tablet [[id number]][[id number]]    Has the patient contacted their pharmacy? Yes.     (Agent: If no, request that the patient contact the pharmacy for the refill.)   Preferred Pharmacy (with phone number or street name): [facility] on [address]   Agent: Please be advised that RX refills may take up to 3 business days. We ask that you follow-up with your pharmacy.

## 2017-08-08 ENCOUNTER — Other Ambulatory Visit: Payer: Self-pay | Admitting: Internal Medicine

## 2017-08-08 MED ORDER — AMPHETAMINE-DEXTROAMPHET ER 30 MG PO CP24: 30.0000 mg | ORAL_CAPSULE | ORAL | 0 refills | Status: DC

## 2017-08-08 MED ORDER — ZOLPIDEM TARTRATE 10 MG PO TABS: ORAL_TABLET | ORAL | 0 refills | Status: DC

## 2017-08-08 MED ORDER — AMPHETAMINE-DEXTROAMPHETAMINE 20 MG PO TABS: ORAL_TABLET | ORAL | 0 refills | Status: DC

## 2017-08-08 NOTE — Telephone Encounter (Signed)
Patient is following up on the script. States she is going out of town & wants to know will it be sent today

## 2017-08-08 NOTE — Telephone Encounter (Signed)
Spoke with pt and informed her that he medication was last refill on 07/09/17. Medication must last 30 days. Medicatio was called in to the pharmacy. Pt verbalized understanding.

## 2017-08-20 ENCOUNTER — Ambulatory Visit (INDEPENDENT_AMBULATORY_CARE_PROVIDER_SITE_OTHER): Payer: Medicare Other

## 2017-08-20 ENCOUNTER — Encounter (INDEPENDENT_AMBULATORY_CARE_PROVIDER_SITE_OTHER): Payer: Self-pay | Admitting: Orthopaedic Surgery

## 2017-08-20 ENCOUNTER — Ambulatory Visit (INDEPENDENT_AMBULATORY_CARE_PROVIDER_SITE_OTHER): Payer: Medicare Other | Admitting: Orthopaedic Surgery

## 2017-08-20 DIAGNOSIS — M25511 Pain in right shoulder: Secondary | ICD-10-CM | POA: Diagnosis not present

## 2017-08-20 DIAGNOSIS — G8929 Other chronic pain: Secondary | ICD-10-CM

## 2017-08-20 DIAGNOSIS — M25512 Pain in left shoulder: Secondary | ICD-10-CM | POA: Diagnosis not present

## 2017-08-20 DIAGNOSIS — M75121 Complete rotator cuff tear or rupture of right shoulder, not specified as traumatic: Secondary | ICD-10-CM

## 2017-08-20 DIAGNOSIS — M1711 Unilateral primary osteoarthritis, right knee: Secondary | ICD-10-CM

## 2017-08-20 DIAGNOSIS — M25561 Pain in right knee: Secondary | ICD-10-CM | POA: Diagnosis not present

## 2017-08-20 MED ORDER — LIDOCAINE HCL 1 % IJ SOLN
3.0000 mL | INTRAMUSCULAR | Status: AC | PRN
  Administered 2017-08-20: 3 mL

## 2017-08-20 MED ORDER — METHYLPREDNISOLONE ACETATE 40 MG/ML IJ SUSP
40.0000 mg | INTRAMUSCULAR | Status: AC | PRN
  Administered 2017-08-20: 40 mg via INTRA_ARTICULAR

## 2017-08-20 NOTE — Progress Notes (Signed)
Office Visit Note   Patient: Leslie [name]           Date of Birth: April 20, 1951           MRN: [id number] Visit Date: 08/20/2017              Requested by: Gordy Savers, MD [address] [place], [place] [address] PCP: Gordy Savers, MD   Assessment & Plan: Visit Diagnoses:  1. Acute pain of right knee   2. Complete tear of right rotator cuff   3. Unilateral primary osteoarthritis, right knee   4. Chronic pain of both shoulders     Plan: I did agree with placing a steroid injection of her right knee today.  She knows to wait at least 3 months between injections in the knee joint.  She still not ready for knee replacement surgery but having had it before the left side she knows that she will proceed with the right side once it is more detrimentally affecting her active daily living, quality of life, mobility.  Since she is not a diabetic we will set her up for bilateral shoulder intra-articular glenohumeral injections by Dr. Alvester Morin in the near future.  Follow-Up Instructions: Return in about 3 months (around 11/18/2017).   Orders:  Orders Placed This Encounter  Procedures  . Large Joint Inj  . XR KNEE 3 VIEW RIGHT   No orders of the defined types were placed in this encounter.     Procedures: Large Joint Inj: R knee on 08/20/2017 3:46 PM Indications: diagnostic evaluation and pain Details: 22 G 1.5 in needle, superolateral approach  Arthrogram: No  Medications: 3 mL lidocaine 1 %; 40 mg methylPREDNISolone acetate 40 MG/ML Outcome: tolerated well, no immediate complications Procedure, treatment alternatives, risks and benefits explained, specific risks discussed. Consent was given by the patient. Immediately prior to procedure a time out was called to verify the correct patient, procedure, equipment, support staff and site/side marked as required. Patient was prepped and draped in the usual sterile fashion.       Clinical Data: No additional  findings.   Subjective: Chief Complaint  Patient presents with  . Right Knee - Pain  The patient is well-known to [place].  She comes in with acute on chronic right knee pain.  She is actually had a left knee replacement before.  She has had left shoulder arthroscopic rotator cuff repair.  She has known moderate arthritis in her right shoulder with a full-thickness retracted rotator cuff tear.  3 months ago she had a steroid injection of the glenohumeral joint by Dr. Alvester Morin under direct fluoroscopy and said that helped greatly.  She like to consider another injection in her shoulder.  She still taking care of grandkids and helping out her daughter quite a bit and she is being as active as possible.  Her right knee pain is become significantly worse recently.  She still not ready for knee replacement just yet she states.  She would like to have a steroid injection in her knee today.  She is not a diabetic.  This will be her right knee.  HPI  Review of Systems She currently denies any headache, chest pain, shortness of breath, fever, chills, nausea, vomiting.  Objective: Vital Signs: There were no vitals taken for this visit.  Physical Exam She is alert point x3 in no acute distress Ortho Exam Examination of her shoulder shows rotator cuff bilaterally.  She has pain with stressing both shoulders  bilaterally.  She has limited abduction more on the left operative side than the right.  Examination of her left knee is normal.  Examination of her right knee shows medial lateral joint line tenderness with varus malalignment.  Range of motion is full.  There is significant patellofemoral crepitation. Specialty Comments:  No specialty comments available.  Imaging: Xr Knee 3 View Right  Result Date: 08/20/2017 3 views of the right knee show significant tricompartmental arthritis with osteophytes in all 3 compartments.  There is varus malalignment and almost complete loss of medial joint space as well as  the patellofemoral joint.  There is also loose bodies that can be seen.    PMFS History: Patient Active Problem List   Diagnosis Date Noted  . Unilateral primary osteoarthritis, right knee 08/20/2017  . Chronic pain of both shoulders 08/20/2017  . Complete tear of right rotator cuff 04/29/2017  . Osteoarthritis of left knee 06/16/2015  . Status post total left knee replacement 06/16/2015  . MYALGIA 12/19/2009  . SUBSTANCE ABUSE 02/06/2009  . KNEE PAIN, LEFT 10/06/2008  . URI 07/11/2008  . ACUTE MAXILLARY SINUSITIS 03/17/2008  . ADD 09/14/2007  . COLONIC POLYPS, HX OF 08/03/2007  . Essential hypertension 01/20/2007  . OBESITY NOS 12/04/2006  . Allergic rhinitis 12/04/2006  . GASTROJEJUNOSTOMY, HX OF 12/04/2006  . BREAST BIOPSY, HX OF 12/04/2006   Past Medical History:  Diagnosis Date  . ADD (attention deficit disorder)   . Allergy   . Arthritis 2016   osteoarthritis left knee  . Colon polyps   . Hypertension   . Left knee pain    chronic  . Narcotic addiction (HCC)   . Obesity    post gastric bypass  . Vitamin D deficiency     Family History  Problem Relation Age of Onset  . Heart disease Mother        post CABG history of CHF  . COPD Father   . Diabetes Sister   . Hypertension Sister        post renal transplant  . Heart disease Brother        CAD    Past Surgical History:  Procedure Laterality Date  . arthscopic knee surgery Left    several times  . BREAST SURGERY  years ago   breast biopsy  . CHOLECYSTECTOMY    . EYE SURGERY Bilateral 2013   Toric Lens implants  . EYE SURGERY Right 2014   corneal amniotic membrane  . GASTRIC BYPASS  2003  . KIDNEY DONATION  2004  . TOTAL KNEE ARTHROPLASTY Left 06/16/2015   Procedure: LEFT TOTAL KNEE ARTHROPLASTY;  Surgeon: Kathryne Hitchhristopher Y Jourden Gilson, MD;  Location: [facility];  Service: Orthopedics;  Laterality: Left;   Social History   Occupational History  . Not on file  Tobacco Use  . Smoking status: Former Smoker      Last attempt to quit: 07/22/1992    Years since quitting: 25.0  . Smokeless tobacco: Never Used  Substance and Sexual Activity  . Alcohol use: Yes    Comment: once every 6 months  . Drug use: No  . Sexual activity: Not on file

## 2017-08-21 ENCOUNTER — Other Ambulatory Visit (INDEPENDENT_AMBULATORY_CARE_PROVIDER_SITE_OTHER): Payer: Self-pay

## 2017-08-21 DIAGNOSIS — M25512 Pain in left shoulder: Principal | ICD-10-CM

## 2017-08-21 DIAGNOSIS — M25511 Pain in right shoulder: Principal | ICD-10-CM

## 2017-08-21 DIAGNOSIS — G8929 Other chronic pain: Secondary | ICD-10-CM

## 2017-09-04 ENCOUNTER — Ambulatory Visit (INDEPENDENT_AMBULATORY_CARE_PROVIDER_SITE_OTHER): Payer: Medicare Other

## 2017-09-04 ENCOUNTER — Ambulatory Visit (INDEPENDENT_AMBULATORY_CARE_PROVIDER_SITE_OTHER): Payer: Medicare Other | Admitting: Physical Medicine and Rehabilitation

## 2017-09-04 ENCOUNTER — Encounter (INDEPENDENT_AMBULATORY_CARE_PROVIDER_SITE_OTHER): Payer: Self-pay | Admitting: Physical Medicine and Rehabilitation

## 2017-09-04 ENCOUNTER — Other Ambulatory Visit: Payer: Self-pay | Admitting: Internal Medicine

## 2017-09-04 DIAGNOSIS — G8929 Other chronic pain: Secondary | ICD-10-CM | POA: Diagnosis not present

## 2017-09-04 DIAGNOSIS — M25512 Pain in left shoulder: Secondary | ICD-10-CM

## 2017-09-04 DIAGNOSIS — M25511 Pain in right shoulder: Secondary | ICD-10-CM | POA: Diagnosis not present

## 2017-09-04 NOTE — Telephone Encounter (Signed)
Okay for refill #30 

## 2017-09-04 NOTE — Telephone Encounter (Signed)
Pt is requesting refill on her zolpidem 10 mg, last filled on 09/08/2017 #30 with 0 refill, Pt last visit was 11/08/2016 please advise if ok to refill

## 2017-09-04 NOTE — Progress Notes (Signed)
Leslie [name] - 67 y.o. female MRN [id number]  Date of birth: 23-Aug-1950  Office Visit Note: Visit Date: 09/04/2017 PCP: Gordy SaversKwiatkowski, Peter F, MD Referred by: Gordy SaversKwiatkowski, Peter F, MD  Subjective: No chief complaint on file.  HPI: Leslie [name] is a 67 year old female who we have completed bilateral shoulder injections back in October with really good relief.  She had good relief up until just recently.  She has been followed by Dr. Magnus IvanBlackman in our office for orthopedic care.  We will repeat bilateral shoulder injections today diagnostically and therapeutically.  She reports worsening with lifting up her arms or picking heavy objects up.  She reports that ibuprofen heat and ice to make the pain a little bit better but not much.    ROS Otherwise per HPI.  Assessment & Plan: Visit Diagnoses:  1. Chronic left shoulder pain   2. Chronic right shoulder pain     Plan: No additional findings.   Meds & Orders: No orders of the defined types were placed in this encounter.  No orders of the defined types were placed in this encounter.   Follow-up: No Follow-up on file.   Procedures: Large Joint Inj: bilateral glenohumeral on 09/04/2017 2:42 PM Indications: pain and diagnostic evaluation Details: 22 G 3.5 in needle, fluoroscopy-guided anteromedial approach  Arthrogram: No  Medications (Right): 3 mL bupivacaine 0.5 %; 40 mg triamcinolone acetonide 40 MG/ML Medications (Left): 3 mL bupivacaine 0.5 %; 40 mg triamcinolone acetonide 40 MG/ML Outcome: tolerated well, no immediate complications  There was excellent flow of contrast producing a partial arthrogram of the glenohumeral joints. The patient did have relief of symptoms during the anesthetic phase of the injection. Procedure, treatment alternatives, risks and benefits explained, specific risks discussed. Consent was given by the patient. Immediately prior to procedure a time out was called to verify the correct patient, procedure,  equipment, support staff and site/side marked as required. Patient was prepped and draped in the usual sterile fashion.      No notes on file   Clinical History: No specialty comments available.  She reports that she quit smoking about 25 years ago. she has never used smokeless tobacco. No results for input(s): HGBA1C, LABURIC in the last 8760 hours.  Objective:  VS:  HT:    WT:   BMI:     BP:   HR: bpm  TEMP: ( )  RESP:  Physical Exam  Musculoskeletal:  Painful range of motion with decreased range of motion of both shoulders.    Ortho Exam Imaging: No results found.  Past Medical/Family/Surgical/Social History: Medications & Allergies reviewed per EMR Patient Active Problem List   Diagnosis Date Noted  . Unilateral primary osteoarthritis, right knee 08/20/2017  . Chronic pain of both shoulders 08/20/2017  . Complete tear of right rotator cuff 04/29/2017  . Osteoarthritis of left knee 06/16/2015  . Status post total left knee replacement 06/16/2015  . MYALGIA 12/19/2009  . SUBSTANCE ABUSE 02/06/2009  . KNEE PAIN, LEFT 10/06/2008  . URI 07/11/2008  . ACUTE MAXILLARY SINUSITIS 03/17/2008  . ADD 09/14/2007  . COLONIC POLYPS, HX OF 08/03/2007  . Essential hypertension 01/20/2007  . OBESITY NOS 12/04/2006  . Allergic rhinitis 12/04/2006  . GASTROJEJUNOSTOMY, HX OF 12/04/2006  . BREAST BIOPSY, HX OF 12/04/2006   Past Medical History:  Diagnosis Date  . ADD (attention deficit disorder)   . Allergy   . Arthritis 2016   osteoarthritis left knee  . Colon polyps   . Hypertension   .  Left knee pain    chronic  . Narcotic addiction (HCC)   . Obesity    post gastric bypass  . Vitamin D deficiency    Family History  Problem Relation Age of Onset  . Heart disease Mother        post CABG history of CHF  . COPD Father   . Diabetes Sister   . Hypertension Sister        post renal transplant  . Heart disease Brother        CAD   Past Surgical History:  Procedure  Laterality Date  . arthscopic knee surgery Left    several times  . BREAST SURGERY  years ago   breast biopsy  . CHOLECYSTECTOMY    . EYE SURGERY Bilateral 2013   Toric Lens implants  . EYE SURGERY Right 2014   corneal amniotic membrane  . GASTRIC BYPASS  2003  . KIDNEY DONATION  2004  . TOTAL KNEE ARTHROPLASTY Left 06/16/2015   Procedure: LEFT TOTAL KNEE ARTHROPLASTY;  Surgeon: Kathryne Hitch, MD;  Location: [facility];  Service: Orthopedics;  Laterality: Left;   Social History   Occupational History  . Not on file  Tobacco Use  . Smoking status: Former Smoker    Last attempt to quit: 07/22/1992    Years since quitting: 25.1  . Smokeless tobacco: Never Used  Substance and Sexual Activity  . Alcohol use: Yes    Comment: once every 6 months  . Drug use: No  . Sexual activity: Not on file

## 2017-09-04 NOTE — Patient Instructions (Signed)

## 2017-09-04 NOTE — Telephone Encounter (Signed)
Rx was phoned in to pt pharmacy [phone] with no refills

## 2017-09-04 NOTE — Progress Notes (Deleted)
Pt states pain in both right and left shoulders. Pt states last injection helped out a lot in October 2018 and pain returned within the last month. Pt states lifting both arms, picking up heavy objects, and reaching makes pain worse. Pt states ibuprofen, heat, and ice make pain better. -Dye Allergies.

## 2017-09-09 NOTE — Telephone Encounter (Signed)
Rx phoned to pharmacy on 09/04/17 by Mendel CorningNancy Kigotho, CMA. Electronically signed today.

## 2017-09-17 MED ORDER — TRIAMCINOLONE ACETONIDE 40 MG/ML IJ SUSP
40.0000 mg | INTRAMUSCULAR | Status: AC | PRN
  Administered 2017-09-04: 40 mg via INTRA_ARTICULAR

## 2017-09-17 MED ORDER — BUPIVACAINE HCL 0.5 % IJ SOLN
3.0000 mL | INTRAMUSCULAR | Status: AC | PRN
  Administered 2017-09-04: 3 mL via INTRA_ARTICULAR

## 2017-09-19 ENCOUNTER — Other Ambulatory Visit: Payer: Self-pay | Admitting: Internal Medicine

## 2017-09-19 ENCOUNTER — Telehealth (INDEPENDENT_AMBULATORY_CARE_PROVIDER_SITE_OTHER): Payer: Self-pay | Admitting: *Deleted

## 2017-09-22 ENCOUNTER — Encounter: Payer: Self-pay | Admitting: Internal Medicine

## 2017-09-22 NOTE — Telephone Encounter (Signed)
Called patient and left message to return call to schedule office visit. 

## 2017-09-22 NOTE — Telephone Encounter (Signed)
Follow-up with Dr. Magnus IvanBlackman

## 2017-09-22 NOTE — Telephone Encounter (Signed)
Spoke with pt to advise and she stated that she will keep her F/U appt with Dr. Magnus IvanBlackman on 11/17/17, if pain gets worse she will call to schedule an appt before then.

## 2017-09-29 ENCOUNTER — Other Ambulatory Visit: Payer: Self-pay | Admitting: Internal Medicine

## 2017-09-29 NOTE — Telephone Encounter (Signed)
No further action needed.

## 2017-09-30 NOTE — Telephone Encounter (Signed)
Patient is coming in 10/01/2017 for medication follow-up. Medications pending til then.

## 2017-10-01 ENCOUNTER — Ambulatory Visit: Payer: Medicare Other | Admitting: Internal Medicine

## 2017-10-01 ENCOUNTER — Encounter: Payer: Self-pay | Admitting: Internal Medicine

## 2017-10-01 VITALS — BP 110/60 | HR 76 | Temp 97.9°F | Wt 205.6 lb

## 2017-10-01 DIAGNOSIS — F5101 Primary insomnia: Secondary | ICD-10-CM

## 2017-10-01 DIAGNOSIS — F900 Attention-deficit hyperactivity disorder, predominantly inattentive type: Secondary | ICD-10-CM | POA: Diagnosis not present

## 2017-10-01 DIAGNOSIS — I10 Essential (primary) hypertension: Secondary | ICD-10-CM

## 2017-10-01 DIAGNOSIS — G8929 Other chronic pain: Secondary | ICD-10-CM

## 2017-10-01 DIAGNOSIS — M25511 Pain in right shoulder: Secondary | ICD-10-CM | POA: Diagnosis not present

## 2017-10-01 DIAGNOSIS — F909 Attention-deficit hyperactivity disorder, unspecified type: Secondary | ICD-10-CM | POA: Insufficient documentation

## 2017-10-01 DIAGNOSIS — M25512 Pain in left shoulder: Secondary | ICD-10-CM

## 2017-10-01 MED ORDER — AMPHETAMINE-DEXTROAMPHET ER 30 MG PO CP24: 30.0000 mg | ORAL_CAPSULE | ORAL | 0 refills | Status: DC

## 2017-10-01 MED ORDER — AMPHETAMINE-DEXTROAMPHETAMINE 20 MG PO TABS: ORAL_TABLET | ORAL | 0 refills | Status: DC

## 2017-10-01 NOTE — Patient Instructions (Signed)
Limit your sodium (Salt) intake  Please check your blood pressure on a regular basis.  If it is consistently greater than 140/90, please make an office appointment.  Return in 6 months for follow-up  

## 2017-10-01 NOTE — Progress Notes (Signed)
Subjective:    Patient ID: Leslie [name], female    DOB: December 28, 1950, 67 y.o.   MRN: [id number]  HPI  67 year old patient who is seen today for follow-up.  She has a history of essential hypertension that has been well controlled on diuretic therapy She is on Adderall for ADHD and she continues to benefit. She states that she is working again usually from 4 PM to 8 PM.  She is on daily extended release Adderall with occasional supplement in the late afternoon She has obesity and is status post gastrojejunostomy in the past She has significant osteoarthritis and main complaint is bilateral shoulder pain.  She has had left rotator cuff surgery and is contemplating surgery on the right  She has chronic insomnia and has been using Ambien nightly   she has depression and this has been well controlled on Lamictal and Prozac  Past Medical History:  Diagnosis Date  . ADD (attention deficit disorder)   . Allergy   . Arthritis 2016   osteoarthritis left knee  . Colon polyps   . Hypertension   . Left knee pain    chronic  . Narcotic addiction (HCC)   . Obesity    post gastric bypass  . Vitamin D deficiency      Social History   Socioeconomic History  . Marital status: Single    Spouse name: Not on file  . Number of children: Not on file  . Years of education: Not on file  . Highest education level: Not on file  Social Needs  . Financial resource strain: Not on file  . Food insecurity - worry: Not on file  . Food insecurity - inability: Not on file  . Transportation needs - medical: Not on file  . Transportation needs - non-medical: Not on file  Occupational History  . Not on file  Tobacco Use  . Smoking status: Former Smoker    Last attempt to quit: 07/22/1992    Years since quitting: 25.2  . Smokeless tobacco: Never Used  Substance and Sexual Activity  . Alcohol use: Yes    Comment: once every 6 months  . Drug use: No  . Sexual activity: Not on file  Other Topics  Concern  . Not on file  Social History Narrative  . Not on file    Past Surgical History:  Procedure Laterality Date  . arthscopic knee surgery Left    several times  . BREAST SURGERY  years ago   breast biopsy  . CHOLECYSTECTOMY    . EYE SURGERY Bilateral 2013   Toric Lens implants  . EYE SURGERY Right 2014   corneal amniotic membrane  . GASTRIC BYPASS  2003  . KIDNEY DONATION  2004  . TOTAL KNEE ARTHROPLASTY Left 06/16/2015   Procedure: LEFT TOTAL KNEE ARTHROPLASTY;  Surgeon: Kathryne Hitch, MD;  Location: [facility];  Service: Orthopedics;  Laterality: Left;    Family History  Problem Relation Age of Onset  . Heart disease Mother        post CABG history of CHF  . COPD Father   . Diabetes Sister   . Hypertension Sister        post renal transplant  . Heart disease Brother        CAD    Allergies  Allergen Reactions  . Sulfamethoxazole Nausea And Vomiting    See patient list of med intolerances due to h/o gastric bypass and nephrectomy    Current Outpatient Medications  on File Prior to Visit  Medication Sig Dispense Refill  . acetaminophen-codeine (TYLENOL #3) 300-30 MG tablet Take 1-2 tablets by mouth every 12 (twelve) hours as needed for moderate pain. 60 tablet 0  . amphetamine-dextroamphetamine (ADDERALL) 20 MG tablet Take 1/2 to 1 tablet by mouth every evening if needed. 30 tablet 0  . amphetamine-dextroamphetamine (ADDERALL) 20 MG tablet take 1/2 to 1 tablet by mouth every evening if needed 30 tablet 0  . Ascorbic Acid (VITAMIN C) 1000 MG tablet Take 1,000 mg by mouth daily.     . calcium-vitamin D (SM CALCIUM 500/VITAMIN D3) 500-400 MG-UNIT tablet Take 1 tablet by mouth daily.     Marland Kitchen. FLUoxetine (PROZAC) 40 MG capsule take 1 capsule by mouth every morning 90 capsule 1  . hydrochlorothiazide (HYDRODIURIL) 25 MG tablet take 1 tablet by mouth once daily 90 tablet 3  . lamoTRIgine (LAMICTAL) 100 MG tablet TAKE 1 TABLET BY MOUTH TWICE A DAY 60 tablet 0  .  triamcinolone (NASACORT) 55 MCG/ACT nasal inhaler Place 2 sprays into the nose daily. (Patient taking differently: Place 2 sprays into the nose daily as needed (allergies). ) 1 Inhaler 6  . zolpidem (AMBIEN) 10 MG tablet TAKE 1 TABLET BY MOUTH ONCE DAILY AT BEDTIME IF NEEDED FOR SLEEP 30 tablet 0   No current facility-administered medications on file prior to visit.     BP 110/60 (BP Location: Left Arm, Patient Position: Sitting, Cuff Size: Large)   Pulse 76   Temp 97.9 F (36.6 C) (Oral)   Wt 205 lb 9.6 oz (93.3 kg)   SpO2 97%   BMI 34.75 kg/m    Review of Systems  Constitutional: Negative.   HENT: Negative for congestion, dental problem, hearing loss, rhinorrhea, sinus pressure, sore throat and tinnitus.   Eyes: Negative for pain, discharge and visual disturbance.  Respiratory: Negative for cough and shortness of breath.   Cardiovascular: Negative for chest pain, palpitations and leg swelling.  Gastrointestinal: Negative for abdominal distention, abdominal pain, blood in stool, constipation, diarrhea, nausea and vomiting.  Genitourinary: Negative for difficulty urinating, dysuria, flank pain, frequency, hematuria, pelvic pain, urgency, vaginal bleeding, vaginal discharge and vaginal pain.  Musculoskeletal: Positive for arthralgias and back pain. Negative for gait problem and joint swelling.  Skin: Negative for rash.  Neurological: Negative for dizziness, syncope, speech difficulty, weakness, numbness and headaches.  Hematological: Negative for adenopathy.  Psychiatric/Behavioral: Positive for decreased concentration, dysphoric mood and sleep disturbance. Negative for agitation and behavioral problems. The patient is not nervous/anxious.        Objective:   Physical Exam  Constitutional: She is oriented to person, place, and time. She appears well-developed and well-nourished.  Blood pressure 110/70 Weight 205   HENT:  Head: Normocephalic.  Right Ear: External ear normal.    Left Ear: External ear normal.  Mouth/Throat: Oropharynx is clear and moist.  Eyes: Conjunctivae and EOM are normal. Pupils are equal, round, and reactive to light.  Neck: Normal range of motion. Neck supple. No thyromegaly present.  Cardiovascular: Normal rate, regular rhythm, normal heart sounds and intact distal pulses.  Pulmonary/Chest: Effort normal and breath sounds normal.  Abdominal: Soft. Bowel sounds are normal. She exhibits no mass. There is no tenderness.  Musculoskeletal: Normal range of motion.  Lymphadenopathy:    She has no cervical adenopathy.  Neurological: She is alert and oriented to person, place, and time.  Skin: Skin is warm and dry. No rash noted.  Psychiatric: She has a normal mood and  affect. Her behavior is normal.          Assessment & Plan:   Essential hypertension well-controlled Obesity status post gastrojejunostomy Insomnia.  Patient will attempt to moderate her Ambien use ADHD.  Adderall refilled  Gabapentin will be discontinued.  She was placed on this medication after an episode of shingles  Schedule annual exam  Rogelia Boga

## 2017-10-14 ENCOUNTER — Telehealth: Payer: Self-pay | Admitting: *Deleted

## 2017-10-14 NOTE — Telephone Encounter (Signed)
Prior auth for Zolpidem 10mg  sent to [email or web address].

## 2017-10-15 ENCOUNTER — Telehealth (INDEPENDENT_AMBULATORY_CARE_PROVIDER_SITE_OTHER): Payer: Self-pay | Admitting: Orthopaedic Surgery

## 2017-10-15 MED ORDER — TRAMADOL HCL 50 MG PO TABS: 50.0000 mg | ORAL_TABLET | Freq: Two times a day (BID) | ORAL | 0 refills | Status: DC | PRN

## 2017-10-15 NOTE — Telephone Encounter (Signed)
Patient having knee pain. Injection didn't help. Wanted to know if she can have injection w/[facility] and to call \\RX  for Tramadol.  Please call patient  advise

## 2017-10-15 NOTE — Telephone Encounter (Signed)
I sent in the tramadol.  For her knee, the only other thing to consider would be likely a knee replacement.  It is too early to have another injection.

## 2017-10-15 NOTE — Telephone Encounter (Signed)
Please advise 

## 2017-10-15 NOTE — Telephone Encounter (Signed)
Morrie SheldonAshley, please call?  I will be in late Thursday, thanks

## 2017-10-17 NOTE — Telephone Encounter (Signed)
Patient aware of the below message  

## 2017-10-22 ENCOUNTER — Other Ambulatory Visit: Payer: Self-pay | Admitting: Internal Medicine

## 2017-10-28 ENCOUNTER — Encounter (INDEPENDENT_AMBULATORY_CARE_PROVIDER_SITE_OTHER): Payer: Self-pay | Admitting: Orthopaedic Surgery

## 2017-10-29 ENCOUNTER — Other Ambulatory Visit: Payer: Self-pay | Admitting: Internal Medicine

## 2017-10-29 ENCOUNTER — Encounter (INDEPENDENT_AMBULATORY_CARE_PROVIDER_SITE_OTHER): Payer: Self-pay | Admitting: Orthopaedic Surgery

## 2017-10-29 ENCOUNTER — Ambulatory Visit (INDEPENDENT_AMBULATORY_CARE_PROVIDER_SITE_OTHER): Payer: Medicare Other | Admitting: Orthopaedic Surgery

## 2017-10-29 DIAGNOSIS — M1711 Unilateral primary osteoarthritis, right knee: Secondary | ICD-10-CM

## 2017-10-29 MED ORDER — TRAMADOL HCL 50 MG PO TABS: 50.0000 mg | ORAL_TABLET | Freq: Two times a day (BID) | ORAL | 0 refills | Status: DC | PRN

## 2017-10-29 NOTE — Progress Notes (Signed)
HPI: [name] comes in today due to right knee pain.  She had an injection right knee by Dr. Magnus IvanBlackman on 08/20/2017 states that she got no relief for greater than a day or so.  She is having pain is constant in the right knee is 6 out of 10 at worst.  Effexor daily living.  She wants to see what she can do about this.  She has known tricompartmental arthritis of the right knee.  She status post left knee replacement which is doing well.  She has had no new injury to the right knee.  She notes that as of yesterday the right knee started catching.   Review of systems: Please see HPI otherwise negative  Physical exam: General well-developed well-nourished female no acute distress mood and affect appropriate.  Bilateral knees no effusion abnormal warmth erythema.  Left knee full range of motion without pain.  No instability valgus varus stressing.  Well-healed surgical incision.  Right knee 0-90 degrees of flexion.  She has tenderness along medial joint line.  Attempts at forced flexion causes pain.  Impression: Right knee tricompartmental arthritis  Plan: Patient does not wish to proceed with any type of surgery at this point i.e. total knee arthroplasty.  She would like to try any conservative measures.  Therefore we will try to see if we can get her approved for a Monovisc injection of the right knee and have her follow-up once this is available.  She did ask for refill on her tramadol which she takes periodically for pain in the right knee.  Questions encouraged and answered at length.  We will see her back once the supplemental injections available.

## 2017-11-10 ENCOUNTER — Other Ambulatory Visit (INDEPENDENT_AMBULATORY_CARE_PROVIDER_SITE_OTHER): Payer: Self-pay | Admitting: Physician Assistant

## 2017-11-11 ENCOUNTER — Telehealth (INDEPENDENT_AMBULATORY_CARE_PROVIDER_SITE_OTHER): Payer: Self-pay | Admitting: Physician Assistant

## 2017-11-11 MED ORDER — TRAMADOL HCL 50 MG PO TABS: 50.0000 mg | ORAL_TABLET | Freq: Two times a day (BID) | ORAL | 0 refills | Status: DC | PRN

## 2017-11-11 NOTE — Telephone Encounter (Signed)
See refill and message below.

## 2017-11-11 NOTE — Telephone Encounter (Signed)
Can you help with this?

## 2017-11-11 NOTE — Telephone Encounter (Signed)
Patient called advised the Rx for Tramadol have to be sent directly to the pharmacy in [place] at the [facility] on the corner of [address] as a new Rx. The number to [facility] is [phone]. The number to contact patient is [phone]

## 2017-11-11 NOTE — Telephone Encounter (Signed)
Rx called into pharm by Morrie SheldonAshley

## 2017-11-11 NOTE — Telephone Encounter (Signed)
I escribed it in to there

## 2017-11-17 ENCOUNTER — Ambulatory Visit (INDEPENDENT_AMBULATORY_CARE_PROVIDER_SITE_OTHER): Payer: Medicare Other | Admitting: Orthopaedic Surgery

## 2017-11-17 ENCOUNTER — Encounter (INDEPENDENT_AMBULATORY_CARE_PROVIDER_SITE_OTHER): Payer: Self-pay | Admitting: Orthopaedic Surgery

## 2017-11-17 DIAGNOSIS — M1711 Unilateral primary osteoarthritis, right knee: Secondary | ICD-10-CM | POA: Diagnosis not present

## 2017-11-17 MED ORDER — HYDROCODONE-ACETAMINOPHEN 5-325 MG PO TABS: 1.0000 | ORAL_TABLET | Freq: Four times a day (QID) | ORAL | 0 refills | Status: DC | PRN

## 2017-11-17 MED ORDER — HYALURONAN 88 MG/4ML IX SOSY
88.0000 mg | PREFILLED_SYRINGE | INTRA_ARTICULAR | Status: AC | PRN
  Administered 2017-11-17: 88 mg via INTRA_ARTICULAR

## 2017-11-17 NOTE — Progress Notes (Signed)
   Procedure Note  Patient: Leslie [name]             Date of Birth: [birth year]           MRN: [id number]             Visit Date: 11/17/2017  Procedures: Visit Diagnoses: Unilateral primary osteoarthritis, right knee  Large Joint Inj: R knee on 11/17/2017 1:10 PM Indications: pain and diagnostic evaluation Details: 22 G 1.5 in needle, superolateral approach  Arthrogram: No  Medications: 88 mg Hyaluronan 88 MG/4ML Outcome: tolerated well, no immediate complications Procedure, treatment alternatives, risks and benefits explained, specific risks discussed. Consent was given by the patient. Immediately prior to procedure a time out was called to verify the correct patient, procedure, equipment, support staff and site/side marked as required. Patient was prepped and draped in the usual sterile fashion.     Patient is well-known to me.  She has moderate osteoarthritis of her right knee.  She is here for scheduled hyaluronic acid injection with Monovisc for her right knee.  He does have a history of a left knee replacement.  She also try all conservative treatment measures before considering replacement of her right knee.  She is doing well otherwise.  She tolerated the injection well.  She knows that we can always provide a steroid injection in her knee if needed down the road or consider knee replacement surgery and this works.  All questions concerns were answered and addressed.

## 2017-11-21 ENCOUNTER — Other Ambulatory Visit (INDEPENDENT_AMBULATORY_CARE_PROVIDER_SITE_OTHER): Payer: Self-pay | Admitting: Orthopaedic Surgery

## 2017-11-21 MED ORDER — TRAMADOL HCL 50 MG PO TABS: 50.0000 mg | ORAL_TABLET | Freq: Two times a day (BID) | ORAL | 0 refills | Status: DC | PRN

## 2017-11-21 NOTE — Telephone Encounter (Signed)
Ok to rf? 

## 2017-11-25 ENCOUNTER — Other Ambulatory Visit: Payer: Self-pay | Admitting: Internal Medicine

## 2017-11-27 ENCOUNTER — Encounter: Payer: Self-pay | Admitting: Internal Medicine

## 2017-11-27 NOTE — Telephone Encounter (Signed)
Copied from CRM [id number]. Topic: Quick Communication - Rx Refill/Question >> Nov 27, 2017  1:18 PM Jonette Eva wrote: Medication: zolpidem (AMBIEN) 10 MG tablet [[id number]]  Has the patient contacted their pharmacy? Yes.   (Agent: If no, request that the patient contact the pharmacy for the refill.) Preferred Pharmacy (with phone number or street name): [facility] Agent: Please be advised that RX refills may take up to 3 business days. We ask that you follow-up with your pharmacy.

## 2017-12-02 ENCOUNTER — Telehealth: Payer: Self-pay | Admitting: Internal Medicine

## 2017-12-02 NOTE — Telephone Encounter (Signed)
Copied from CRM [id number]. Topic: Quick Communication - Rx Refill/Question >> Dec 02, 2017 12:05 PM Crist Infante wrote: Medication: amphetamine-dextroamphetamine (ADDERALL XR) 30 MG 24 hr capsule amphetamine-dextroamphetamine (ADDERALL) 20 MG tablet (needs this one today)  [facility] [phone] - [place], [place] - [address] AT [facility] [address] [phone] (Phone) [phone] (Fax)  Pt has scheduled cpe on 12/29/17

## 2017-12-03 ENCOUNTER — Other Ambulatory Visit: Payer: Self-pay

## 2017-12-03 MED ORDER — AMPHETAMINE-DEXTROAMPHETAMINE 20 MG PO TABS: ORAL_TABLET | ORAL | 0 refills | Status: DC

## 2017-12-03 MED ORDER — AMPHETAMINE-DEXTROAMPHET ER 30 MG PO CP24: 30.0000 mg | ORAL_CAPSULE | ORAL | 0 refills | Status: DC

## 2017-12-03 NOTE — Telephone Encounter (Signed)
Medication will be ready for pickup around 11am

## 2017-12-19 ENCOUNTER — Other Ambulatory Visit (INDEPENDENT_AMBULATORY_CARE_PROVIDER_SITE_OTHER): Payer: Self-pay | Admitting: Physician Assistant

## 2017-12-19 ENCOUNTER — Other Ambulatory Visit (INDEPENDENT_AMBULATORY_CARE_PROVIDER_SITE_OTHER): Payer: Self-pay | Admitting: Orthopaedic Surgery

## 2017-12-19 NOTE — Telephone Encounter (Signed)
Please advise 

## 2017-12-19 NOTE — Telephone Encounter (Signed)
Please advise which one

## 2017-12-24 ENCOUNTER — Other Ambulatory Visit: Payer: Self-pay | Admitting: Internal Medicine

## 2017-12-26 ENCOUNTER — Encounter: Payer: Self-pay | Admitting: Internal Medicine

## 2017-12-29 ENCOUNTER — Encounter: Payer: Self-pay | Admitting: Internal Medicine

## 2017-12-29 ENCOUNTER — Encounter: Payer: Medicare Other | Admitting: Internal Medicine

## 2017-12-29 DIAGNOSIS — Z0289 Encounter for other administrative examinations: Secondary | ICD-10-CM

## 2018-01-01 ENCOUNTER — Other Ambulatory Visit: Payer: Self-pay | Admitting: Internal Medicine

## 2018-01-01 DIAGNOSIS — Z1231 Encounter for screening mammogram for malignant neoplasm of breast: Secondary | ICD-10-CM

## 2018-01-02 ENCOUNTER — Telehealth (INDEPENDENT_AMBULATORY_CARE_PROVIDER_SITE_OTHER): Payer: Self-pay | Admitting: Orthopaedic Surgery

## 2018-01-02 NOTE — Telephone Encounter (Signed)
Please advise 

## 2018-01-05 ENCOUNTER — Ambulatory Visit (INDEPENDENT_AMBULATORY_CARE_PROVIDER_SITE_OTHER): Payer: Medicare Other | Admitting: Internal Medicine

## 2018-01-05 ENCOUNTER — Encounter: Payer: Self-pay | Admitting: Internal Medicine

## 2018-01-05 VITALS — BP 128/72 | HR 70 | Temp 98.1°F | Wt 201.0 lb

## 2018-01-05 DIAGNOSIS — F900 Attention-deficit hyperactivity disorder, predominantly inattentive type: Secondary | ICD-10-CM

## 2018-01-05 DIAGNOSIS — M1711 Unilateral primary osteoarthritis, right knee: Secondary | ICD-10-CM

## 2018-01-05 DIAGNOSIS — I10 Essential (primary) hypertension: Secondary | ICD-10-CM | POA: Diagnosis not present

## 2018-01-05 DIAGNOSIS — Z0000 Encounter for general adult medical examination without abnormal findings: Secondary | ICD-10-CM

## 2018-01-05 LAB — COMPREHENSIVE METABOLIC PANEL
ALBUMIN: 4.3 g/dL (ref 3.5–5.2)
ALT: 12 U/L (ref 0–35)
AST: 14 U/L (ref 0–37)
Alkaline Phosphatase: 102 U/L (ref 39–117)
BUN: 16 mg/dL (ref 6–23)
CALCIUM: 9.7 mg/dL (ref 8.4–10.5)
CHLORIDE: 97 meq/L (ref 96–112)
CO2: 27 mEq/L (ref 19–32)
Creatinine, Ser: 1.08 mg/dL (ref 0.40–1.20)
GFR: 53.7 mL/min — ABNORMAL LOW (ref >60.00)
Glucose, Bld: 101 mg/dL — ABNORMAL HIGH (ref 70–99)
Potassium: 4.1 mEq/L (ref 3.5–5.1)
SODIUM: 134 meq/L — AB (ref 135–145)
Total Bilirubin: 0.5 mg/dL (ref 0.2–1.2)
Total Protein: 6.5 g/dL (ref 6.0–8.3)

## 2018-01-05 LAB — CBC WITH DIFFERENTIAL/PLATELET
BASOS PCT: 0.5 % (ref 0.0–3.0)
Basophils Absolute: 0 10*3/uL (ref 0.0–0.1)
EOS PCT: 2.1 % (ref 0.0–5.0)
Eosinophils Absolute: 0.1 10*3/uL (ref 0.0–0.7)
HEMATOCRIT: 40.5 % (ref 36.0–46.0)
HEMOGLOBIN: 13.8 g/dL (ref 12.0–15.0)
LYMPHS PCT: 14.5 % (ref 12.0–46.0)
Lymphs Abs: 1 10*3/uL (ref 0.7–4.0)
MCHC: 34 g/dL (ref 30.0–36.0)
MCV: 86.5 fl (ref 78.0–100.0)
Monocytes Absolute: 0.3 10*3/uL (ref 0.1–1.0)
Monocytes Relative: 4.2 % (ref 3.0–12.0)
Neutro Abs: 5.5 10*3/uL (ref 1.4–7.7)
Neutrophils Relative %: 78.7 % — ABNORMAL HIGH (ref 43.0–77.0)
Platelets: 336 10*3/uL (ref 150.0–400.0)
RBC: 4.69 Mil/uL (ref 3.87–5.11)
RDW: 13.4 % (ref 11.5–15.5)
WBC: 7 10*3/uL (ref 4.0–10.5)

## 2018-01-05 LAB — LIPID PANEL
CHOL/HDL RATIO: 3
CHOLESTEROL: 222 mg/dL — AB (ref 0–200)
HDL: 70.8 mg/dL (ref >39.00)
LDL Cholesterol: 137 mg/dL — ABNORMAL HIGH (ref 0–99)
NonHDL: 151.56
Triglycerides: 75 mg/dL (ref 0.0–149.0)
VLDL: 15 mg/dL (ref 0.0–40.0)

## 2018-01-05 LAB — TSH: TSH: 1.12 u[IU]/mL (ref 0.35–4.50)

## 2018-01-05 MED ORDER — AMPHETAMINE-DEXTROAMPHET ER 30 MG PO CP24: 30.0000 mg | ORAL_CAPSULE | ORAL | 0 refills | Status: DC

## 2018-01-05 MED ORDER — FLUOXETINE HCL 40 MG PO CAPS: 40.0000 mg | ORAL_CAPSULE | Freq: Every morning | ORAL | 3 refills | Status: DC

## 2018-01-05 MED ORDER — LAMOTRIGINE 100 MG PO TABS: 100.0000 mg | ORAL_TABLET | Freq: Two times a day (BID) | ORAL | 2 refills | Status: DC

## 2018-01-05 MED ORDER — AMPHETAMINE-DEXTROAMPHETAMINE 20 MG PO TABS: ORAL_TABLET | ORAL | 0 refills | Status: DC

## 2018-01-05 MED ORDER — ACETAMINOPHEN-CODEINE #3 300-30 MG PO TABS: ORAL_TABLET | ORAL | 0 refills | Status: DC

## 2018-01-05 MED ORDER — ZOLPIDEM TARTRATE 5 MG PO TABS: 5.0000 mg | ORAL_TABLET | Freq: Every day | ORAL | 0 refills | Status: DC

## 2018-01-05 MED ORDER — HYDROCHLOROTHIAZIDE 25 MG PO TABS: 25.0000 mg | ORAL_TABLET | Freq: Every day | ORAL | 3 refills | Status: DC

## 2018-01-05 NOTE — Telephone Encounter (Signed)
Patient called back, she is wanting the Tylenol #3.  Thank you

## 2018-01-05 NOTE — Telephone Encounter (Signed)
IC LMVM for patient to call me, need to see which medicine she wants called in.

## 2018-01-05 NOTE — Patient Instructions (Addendum)
Limit your sodium (Salt) intake  Please check your blood pressure on a regular basis.  If it is consistently greater than 150/90, please make an office appointment.  Attempt to minimize your nightly use of Ambien  Return in 6 months for follow-up  Schedule your colonoscopy to help detect colon cancer.

## 2018-01-05 NOTE — Addendum Note (Signed)
Addended by: Cherre HugerMAY, Cache Bills E on: 01/05/2018 03:53 PM   Modules accepted: Orders

## 2018-01-05 NOTE — Progress Notes (Signed)
Subjective:    Patient ID: Leslie [name], female    DOB: 1951/04/08, 67 y.o.   MRN: [id number]  HPI  67 year old patient who is seen today for a preventive health examination as well as a subsequent Medicare wellness visit She has a history of ADHD and remains on Adderall.  She still works part-time and feels this medication remains very beneficial.  She is followed closely by orthopedics and is anticipating right total knee replacement therapy soon.  She does have a history of substance abuse and does receive occasional narcotics from orthopedics.  She has been followed at [facility] counseling and has been treated with Lamictal iwith benefit.   Last colonoscopy 2006    Past Medical History:  Diagnosis Date  . ADD (attention deficit disorder)   . Allergy   . Arthritis 2016   osteoarthritis left knee  . Colon polyps   . Hypertension   . Left knee pain    chronic  . Narcotic addiction (HCC)   . Obesity    post gastric bypass  . Vitamin D deficiency      Social History   Socioeconomic History  . Marital status: Single    Spouse name: Not on file  . Number of children: Not on file  . Years of education: Not on file  . Highest education level: Not on file  Occupational History  . Not on file  Social Needs  . Financial resource strain: Not on file  . Food insecurity:    Worry: Not on file    Inability: Not on file  . Transportation needs:    Medical: Not on file    Non-medical: Not on file  Tobacco Use  . Smoking status: Former Smoker    Last attempt to quit: 07/22/1992    Years since quitting: 25.4  . Smokeless tobacco: Never Used  Substance and Sexual Activity  . Alcohol use: Yes    Comment: once every 6 months  . Drug use: No  . Sexual activity: Not on file  Lifestyle  . Physical activity:    Days per week: Not on file    Minutes per session: Not on file  . Stress: Not on file  Relationships  . Social connections:    Talks on phone: Not on file    Gets together: Not on file    Attends religious service: Not on file    Active member of club or organization: Not on file    Attends meetings of clubs or organizations: Not on file    Relationship status: Not on file  . Intimate partner violence:    Fear of current or ex partner: Not on file    Emotionally abused: Not on file    Physically abused: Not on file    Forced sexual activity: Not on file  Other Topics Concern  . Not on file  Social History Narrative  . Not on file    Past Surgical History:  Procedure Laterality Date  . arthscopic knee surgery Left    several times  . BREAST SURGERY  years ago   breast biopsy  . CHOLECYSTECTOMY    . EYE SURGERY Bilateral 2013   Toric Lens implants  . EYE SURGERY Right 2014   corneal amniotic membrane  . GASTRIC BYPASS  2003  . KIDNEY DONATION  2004  . TOTAL KNEE ARTHROPLASTY Left 06/16/2015   Procedure: LEFT TOTAL KNEE ARTHROPLASTY;  Surgeon: Kathryne Hitchhristopher Y Blackman, MD;  Location: [facility];  Service:  Orthopedics;  Laterality: Left;    Family History  Problem Relation Age of Onset  . Heart disease Mother        post CABG history of CHF  . COPD Father   . Diabetes Sister   . Hypertension Sister        post renal transplant  . Heart disease Brother        CAD    Allergies  Allergen Reactions  . Sulfamethoxazole Nausea And Vomiting    See patient list of med intolerances due to h/o gastric bypass and nephrectomy    Current Outpatient Medications on File Prior to Visit  Medication Sig Dispense Refill  . Ascorbic Acid (VITAMIN C) 1000 MG tablet Take 1,000 mg by mouth daily.     . calcium-vitamin D (SM CALCIUM 500/VITAMIN D3) 500-400 MG-UNIT tablet Take 1 tablet by mouth daily.     Marland Kitchen HYDROcodone-acetaminophen (NORCO/VICODIN) 5-325 MG tablet Take 1 tablet by mouth every 6 (six) hours as needed for moderate pain. 40 tablet 0  . neomycin-polymyxin b-dexamethasone (MAXITROL) 3.5-10000-0.1 SUSP   0  . Polyethyl Glyc-Propyl Glyc  PF (SYSTANE PRESERVATIVE FREE) 0.4-0.3 % SOLN Apply to eye.    . traMADol (ULTRAM) 50 MG tablet TAKE 1 TO 2 TABLETS EVERY 12 HRS AS NEEDED FOR PAIN 60 tablet 0  . triamcinolone (NASACORT) 55 MCG/ACT nasal inhaler Place 2 sprays into the nose daily. (Patient taking differently: Place 2 sprays into the nose daily as needed (allergies). ) 1 Inhaler 6  . acetaminophen-codeine (TYLENOL #3) 300-30 MG tablet TAKE 1-2 TABLETS BY MOUTH EVERY 12 HOURS AS NEEDED FOR PAIN 60 tablet 0   No current facility-administered medications on file prior to visit.     BP 128/72 (BP Location: Right Arm, Patient Position: Sitting, Cuff Size: Large)   Pulse 70   Temp 98.1 F (36.7 C) (Oral)   Wt 201 lb (91.2 kg)   SpO2 100%   BMI 33.97 kg/m    Subsequent Medicare wellness visit  1. Risk factors, based on past  M,S,F history.  Cardiovascular risk factors include history of impaired glucose tolerance and mild hypertension  2.  Physical activities: Fairly sedentary.  Limited by arthritis especially right knee pain  3.  Depression/mood: History of ADHD  4.  Hearing: No deficits  5.  ADL's: Independent  6.  Fall risk: Moderate due to knee pain as well as obesity  7.  Home safety: No issues identified   8.  Height weight, and visual acuity; height and weight stable no change in visual acuity   9.  Counseling: Continue efforts at weight loss and heart healthy diet  10. Lab orders based on risk factors: Laboratory update will be reviewed  11. Referral : Schedule follow-up colonoscopy  12. Care plan: Continue efforts at aggressive risk factor modification  13. Cognitive assessment: Alert and appropriate normal affect.  No cognitive dysfunction  14. Screening: Patient provided with a written and personalized 5-10 year screening schedule in the AVS.    15. Provider List Update: GI primary care orthopedics     Review of Systems  Constitutional: Positive for fatigue. Negative for appetite change,  fever and unexpected weight change.  HENT: Negative for congestion, dental problem, ear pain, hearing loss, mouth sores, nosebleeds, rhinorrhea, sinus pressure, sore throat, tinnitus, trouble swallowing and voice change.   Eyes: Negative for photophobia, pain, discharge, redness and visual disturbance.  Respiratory: Negative for cough, chest tightness and shortness of breath.   Cardiovascular: Negative for  chest pain, palpitations and leg swelling.  Gastrointestinal: Negative for abdominal distention, abdominal pain, blood in stool, constipation, diarrhea, nausea, rectal pain and vomiting.  Genitourinary: Negative for difficulty urinating, dysuria, flank pain, frequency, genital sores, hematuria, menstrual problem, pelvic pain, urgency, vaginal bleeding, vaginal discharge and vaginal pain.  Musculoskeletal: Positive for arthralgias and back pain. Negative for gait problem, joint swelling and neck stiffness.       Shoulder and right knee pain  Skin: Negative for rash.  Neurological: Negative for dizziness, syncope, speech difficulty, weakness, light-headedness, numbness and headaches.  Hematological: Negative for adenopathy. Does not bruise/bleed easily.  Psychiatric/Behavioral: Positive for decreased concentration. Negative for agitation, behavioral problems, dysphoric mood, self-injury and suicidal ideas. The patient is not nervous/anxious.        Objective:   Physical Exam  Constitutional: She is oriented to person, place, and time. She appears well-developed and well-nourished.   Weight 201 blood pressure well controlled  HENT:  Head: Normocephalic.  Right Ear: External ear normal.  Left Ear: External ear normal.  Mouth/Throat: Oropharynx is clear and moist.  Eyes: Pupils are equal, round, and reactive to light. Conjunctivae and EOM are normal.  Neck: Normal range of motion. Neck supple. No thyromegaly present.  Cardiovascular: Normal rate, regular rhythm, normal heart sounds and  intact distal pulses.  Dorsalis pedis pulses full Posterior tibial pulses +1  Pulmonary/Chest: Effort normal and breath sounds normal.  Abdominal: Soft. Bowel sounds are normal. She exhibits no mass. There is no tenderness.  Right upper quadrant scar  Musculoskeletal: Normal range of motion.  Status post left total knee replacement  Lymphadenopathy:    She has no cervical adenopathy.  Neurological: She is alert and oriented to person, place, and time.  Skin: Skin is warm and dry. No rash noted.  Psychiatric: She has a normal mood and affect. Her behavior is normal.          Assessment & Plan:   Preventive health care Subsequent Medicare wellness visit Impaired glucose tolerance Osteoarthritis ADHD Adderall refilled  history of substance abuse.  Impaired glucose tolerance Obesity   we will review updated lab Patient counseled about the use of any narcotics. Will decrease Ambien to 5 mg as needed.  Nightly use discouraged Schedule colonoscopy  [facility]

## 2018-01-05 NOTE — Telephone Encounter (Signed)
Rx Tylenol #3 called in to patient's pharm

## 2018-01-05 NOTE — Telephone Encounter (Signed)
Only one of these can be called in.

## 2018-01-06 LAB — HEPATITIS C ANTIBODY
Hepatitis C Ab: NONREACTIVE
SIGNAL TO CUT-OFF: 0.02 (ref <1.00)

## 2018-01-13 ENCOUNTER — Other Ambulatory Visit (INDEPENDENT_AMBULATORY_CARE_PROVIDER_SITE_OTHER): Payer: Self-pay | Admitting: Orthopaedic Surgery

## 2018-01-13 NOTE — Telephone Encounter (Signed)
Please advise 

## 2018-01-14 ENCOUNTER — Encounter (INDEPENDENT_AMBULATORY_CARE_PROVIDER_SITE_OTHER): Payer: Self-pay | Admitting: Orthopaedic Surgery

## 2018-01-14 ENCOUNTER — Ambulatory Visit (INDEPENDENT_AMBULATORY_CARE_PROVIDER_SITE_OTHER): Payer: Medicare Other | Admitting: Orthopaedic Surgery

## 2018-01-14 DIAGNOSIS — M1711 Unilateral primary osteoarthritis, right knee: Secondary | ICD-10-CM | POA: Diagnosis not present

## 2018-01-14 NOTE — Progress Notes (Signed)
The patient has known significant osteoarthritis and general joint disease of her right knee.  She has had a previous left total knee arthroplasty done 3 years ago.  She has been trying every bit of conservative treatment she can for her right knee.  We last provided by hyaluronic acid injection in her knee in April of this year.  She says it started to wear off now.  It is not getting better for her.  She is ready for knee replacement surgery she states sometime in the next 2 to 3 months.  She would like to have a steroid injection today in her knee.  We have had her on tramadol for pain.  On examination her knee she has medial lateral joint line tenderness of the right knee with good range of motion.  The knee is ligamentously stable.  X-rays reviewed from the past shows significant and severe arthritis of the right knee.  At this point I agree with proceeding with knee replacement surgery given the failure of all forms conservative treatment including 3 years of rest, ice, heat, physical therapy with quad strengthening exercises, anti-inflammatories, steroid injections, and hyaluronic acid injections.  At this point her pain is 10 out of 10 it is detrimental effect of her activities of daily living, her mobility, and her quality of life.  We will work on getting the surgery scheduled.  All questions concerns were answered and addressed.  She understands fully the risks and benefits of the surgery having had it done before.  She understands what her intraoperative and postoperative course involved as well.

## 2018-01-19 ENCOUNTER — Other Ambulatory Visit (INDEPENDENT_AMBULATORY_CARE_PROVIDER_SITE_OTHER): Payer: Self-pay | Admitting: Orthopaedic Surgery

## 2018-01-19 NOTE — Telephone Encounter (Signed)
Please advise 

## 2018-01-25 ENCOUNTER — Encounter (INDEPENDENT_AMBULATORY_CARE_PROVIDER_SITE_OTHER): Payer: Self-pay | Admitting: Orthopaedic Surgery

## 2018-01-26 ENCOUNTER — Encounter (INDEPENDENT_AMBULATORY_CARE_PROVIDER_SITE_OTHER): Payer: Self-pay | Admitting: Orthopaedic Surgery

## 2018-01-27 ENCOUNTER — Ambulatory Visit
Admission: RE | Admit: 2018-01-27 | Discharge: 2018-01-27 | Disposition: A | Discharge status: Home / Self Care | Payer: Medicare Other | Source: Ambulatory Visit | Attending: Internal Medicine | Admitting: Internal Medicine

## 2018-01-27 DIAGNOSIS — Z1231 Encounter for screening mammogram for malignant neoplasm of breast: Secondary | ICD-10-CM

## 2018-01-28 ENCOUNTER — Other Ambulatory Visit: Payer: Self-pay | Admitting: Internal Medicine

## 2018-01-28 ENCOUNTER — Encounter: Payer: Self-pay | Admitting: Internal Medicine

## 2018-01-28 DIAGNOSIS — R928 Other abnormal and inconclusive findings on diagnostic imaging of breast: Secondary | ICD-10-CM

## 2018-02-02 ENCOUNTER — Ambulatory Visit: Admission: RE | Admit: 2018-02-02 | Payer: Medicare Other | Source: Ambulatory Visit

## 2018-02-02 ENCOUNTER — Ambulatory Visit
Admission: RE | Admit: 2018-02-02 | Discharge: 2018-02-02 | Disposition: A | Discharge status: Home / Self Care | Payer: Medicare Other | Source: Ambulatory Visit | Attending: Internal Medicine | Admitting: Internal Medicine

## 2018-02-02 DIAGNOSIS — R928 Other abnormal and inconclusive findings on diagnostic imaging of breast: Secondary | ICD-10-CM

## 2018-02-03 ENCOUNTER — Encounter (INDEPENDENT_AMBULATORY_CARE_PROVIDER_SITE_OTHER): Payer: Self-pay | Admitting: Orthopaedic Surgery

## 2018-02-04 MED ORDER — ZOLPIDEM TARTRATE 10 MG PO TABS: 10.0000 mg | ORAL_TABLET | Freq: Every evening | ORAL | 0 refills | Status: DC | PRN

## 2018-02-11 ENCOUNTER — Other Ambulatory Visit (INDEPENDENT_AMBULATORY_CARE_PROVIDER_SITE_OTHER): Payer: Self-pay | Admitting: Orthopaedic Surgery

## 2018-02-11 NOTE — Telephone Encounter (Signed)
Please advise 

## 2018-02-26 ENCOUNTER — Other Ambulatory Visit (INDEPENDENT_AMBULATORY_CARE_PROVIDER_SITE_OTHER): Payer: Self-pay | Admitting: Orthopaedic Surgery

## 2018-02-26 NOTE — Telephone Encounter (Signed)
Rx request 

## 2018-02-27 ENCOUNTER — Other Ambulatory Visit (INDEPENDENT_AMBULATORY_CARE_PROVIDER_SITE_OTHER): Payer: Self-pay | Admitting: Orthopaedic Surgery

## 2018-03-02 MED ORDER — TRAMADOL HCL 50 MG PO TABS: 50.0000 mg | ORAL_TABLET | Freq: Four times a day (QID) | ORAL | 0 refills | Status: DC | PRN

## 2018-03-02 NOTE — Telephone Encounter (Signed)
Please advise 

## 2018-03-02 NOTE — Telephone Encounter (Signed)
CB patient  

## 2018-03-03 MED ORDER — ACETAMINOPHEN-CODEINE #3 300-30 MG PO TABS: ORAL_TABLET | ORAL | 0 refills | Status: DC

## 2018-03-04 ENCOUNTER — Other Ambulatory Visit: Payer: Self-pay | Admitting: Internal Medicine

## 2018-03-09 ENCOUNTER — Encounter: Payer: Self-pay | Admitting: Internal Medicine

## 2018-04-01 ENCOUNTER — Other Ambulatory Visit: Payer: Self-pay | Admitting: Internal Medicine

## 2018-04-02 NOTE — Telephone Encounter (Addendum)
Pharmacy did not receive Ambien refill sent on 04/02/18, please resend.

## 2018-04-02 NOTE — Addendum Note (Signed)
Addended by: Wilford CornerOVINGTON, Dionel Archey W on: 04/02/2018 05:55 PM   Modules accepted: Orders

## 2018-04-02 NOTE — Telephone Encounter (Signed)
Patient stated she checked with her pharmacy and they said they did not receive her Zolpidem Tartrate 10 MG prescription.  Please resend.

## 2018-04-07 ENCOUNTER — Other Ambulatory Visit (INDEPENDENT_AMBULATORY_CARE_PROVIDER_SITE_OTHER): Payer: Self-pay

## 2018-04-07 ENCOUNTER — Other Ambulatory Visit: Payer: Self-pay

## 2018-04-07 MED ORDER — TRAMADOL HCL 50 MG PO TABS: 50.0000 mg | ORAL_TABLET | Freq: Four times a day (QID) | ORAL | 0 refills | Status: DC | PRN

## 2018-04-07 NOTE — Telephone Encounter (Signed)
Rx request 

## 2018-04-08 NOTE — Telephone Encounter (Signed)
Refills given at office visit 01/05/18

## 2018-04-09 MED ORDER — AMPHETAMINE-DEXTROAMPHETAMINE 20 MG PO TABS: ORAL_TABLET | ORAL | 0 refills | Status: DC

## 2018-04-09 MED ORDER — AMPHETAMINE-DEXTROAMPHET ER 30 MG PO CP24: 30.0000 mg | ORAL_CAPSULE | ORAL | 0 refills | Status: DC

## 2018-04-10 ENCOUNTER — Encounter: Payer: Self-pay | Admitting: Adult Health

## 2018-04-10 ENCOUNTER — Ambulatory Visit: Payer: Medicare Other | Admitting: Adult Health

## 2018-04-10 VITALS — BP 124/88 | HR 78 | Temp 98.0°F | Wt 196.0 lb

## 2018-04-10 DIAGNOSIS — J0110 Acute frontal sinusitis, unspecified: Secondary | ICD-10-CM

## 2018-04-10 MED ORDER — DOXYCYCLINE HYCLATE 100 MG PO CAPS: 100.0000 mg | ORAL_CAPSULE | Freq: Two times a day (BID) | ORAL | 0 refills | Status: DC

## 2018-04-10 NOTE — Progress Notes (Signed)
Subjective:    Patient ID: Leslie [name], female    DOB: [birth year], 67 y.o.   MRN: [id number]  Sinusitis  This is a new problem. The current episode started in the past 7 days. There has been no fever. Associated symptoms include congestion, coughing, headaches and sinus pressure. Pertinent negatives include no chills, diaphoresis, ear pain, hoarse voice, neck pain, shortness of breath or sore throat. Past treatments include oral decongestants. The treatment provided no relief.    Review of Systems  Constitutional: Negative for chills and diaphoresis.  HENT: Positive for congestion, rhinorrhea, sinus pressure and sinus pain. Negative for ear pain, hoarse voice, sore throat and trouble swallowing.   Respiratory: Positive for cough. Negative for shortness of breath.   Musculoskeletal: Negative for neck pain.  Neurological: Positive for headaches.   Past Medical History:  Diagnosis Date  . ADD (attention deficit disorder)   . Allergy   . Arthritis 2016   osteoarthritis left knee  . Colon polyps   . Hypertension   . Left knee pain    chronic  . Narcotic addiction (HCC)   . Obesity    post gastric bypass  . Vitamin D deficiency     Social History   Socioeconomic History  . Marital status: Single    Spouse name: Not on file  . Number of children: Not on file  . Years of education: Not on file  . Highest education level: Not on file  Occupational History  . Not on file  Social Needs  . Financial resource strain: Not on file  . Food insecurity:    Worry: Not on file    Inability: Not on file  . Transportation needs:    Medical: Not on file    Non-medical: Not on file  Tobacco Use  . Smoking status: Former Smoker    Last attempt to quit: 07/22/1992    Years since quitting: 25.7  . Smokeless tobacco: Never Used  Substance and Sexual Activity  . Alcohol use: Yes    Comment: once every 6 months  . Drug use: No  . Sexual activity: Not on file  Lifestyle  .  Physical activity:    Days per week: Not on file    Minutes per session: Not on file  . Stress: Not on file  Relationships  . Social connections:    Talks on phone: Not on file    Gets together: Not on file    Attends religious service: Not on file    Active member of club or organization: Not on file    Attends meetings of clubs or organizations: Not on file    Relationship status: Not on file  . Intimate partner violence:    Fear of current or ex partner: Not on file    Emotionally abused: Not on file    Physically abused: Not on file    Forced sexual activity: Not on file  Other Topics Concern  . Not on file  Social History Narrative  . Not on file    Past Surgical History:  Procedure Laterality Date  . arthscopic knee surgery Left    several times  . BREAST BIOPSY    . BREAST SURGERY  years ago   breast biopsy  . CHOLECYSTECTOMY    . EYE SURGERY Bilateral 2013   Toric Lens implants  . EYE SURGERY Right 2014   corneal amniotic membrane  . GASTRIC BYPASS  2003  . KIDNEY DONATION  2004  .  TOTAL KNEE ARTHROPLASTY Left 06/16/2015   Procedure: LEFT TOTAL KNEE ARTHROPLASTY;  Surgeon: Kathryne Hitch, MD;  Location: [facility];  Service: Orthopedics;  Laterality: Left;    Family History  Problem Relation Age of Onset  . Heart disease Mother        post CABG history of CHF  . COPD Father   . Diabetes Sister   . Hypertension Sister        post renal transplant  . Heart disease Brother        CAD    Allergies  Allergen Reactions  . Sulfamethoxazole Nausea And Vomiting    See patient list of med intolerances due to h/o gastric bypass and nephrectomy    Current Outpatient Medications on File Prior to Visit  Medication Sig Dispense Refill  . acetaminophen-codeine (TYLENOL #3) 300-30 MG tablet TAKE 1 TO 2 TABLETS BY MOUTH EVERY 12 HOURS AS NEEDED FOR PAIN 60 tablet 0  . amphetamine-dextroamphetamine (ADDERALL XR) 30 MG 24 hr capsule Take 1 capsule (30 mg total)  by mouth every morning. 30 capsule 0  . amphetamine-dextroamphetamine (ADDERALL XR) 30 MG 24 hr capsule Take 1 capsule (30 mg total) by mouth every morning. 30 capsule 0  . amphetamine-dextroamphetamine (ADDERALL XR) 30 MG 24 hr capsule Take 1 capsule (30 mg total) by mouth every morning. 30 capsule 0  . amphetamine-dextroamphetamine (ADDERALL) 20 MG tablet Take 1/2 to 1 tablet by mouth every evening if needed. 30 tablet 0  . amphetamine-dextroamphetamine (ADDERALL) 20 MG tablet take 1/2 to 1 tablet by mouth every evening if needed 30 tablet 0  . amphetamine-dextroamphetamine (ADDERALL) 20 MG tablet Take 1/2 to 1 tablet by mouth every evening if needed. 30 tablet 0  . Ascorbic Acid (VITAMIN C) 1000 MG tablet Take 1,000 mg by mouth daily.     . calcium-vitamin D (SM CALCIUM 500/VITAMIN D3) 500-400 MG-UNIT tablet Take 1 tablet by mouth daily.     Marland Kitchen FLUoxetine (PROZAC) 40 MG capsule Take 1 capsule (40 mg total) by mouth every morning. 90 capsule 3  . hydrochlorothiazide (HYDRODIURIL) 25 MG tablet Take 1 tablet (25 mg total) by mouth daily. 90 tablet 3  . HYDROcodone-acetaminophen (NORCO/VICODIN) 5-325 MG tablet Take 1 tablet by mouth every 6 (six) hours as needed for moderate pain. 40 tablet 0  . lamoTRIgine (LAMICTAL) 100 MG tablet Take 1 tablet (100 mg total) by mouth 2 (two) times daily. 180 tablet 2  . neomycin-polymyxin b-dexamethasone (MAXITROL) 3.5-10000-0.1 SUSP   0  . Polyethyl Glyc-Propyl Glyc PF (SYSTANE PRESERVATIVE FREE) 0.4-0.3 % SOLN Apply to eye.    . traMADol (ULTRAM) 50 MG tablet Take 1 tablet (50 mg total) by mouth every 6 (six) hours as needed for up to 60 doses. 60 tablet 0  . triamcinolone (NASACORT) 55 MCG/ACT nasal inhaler Place 2 sprays into the nose daily. (Patient taking differently: Place 2 sprays into the nose daily as needed (allergies). ) 1 Inhaler 6  . zolpidem (AMBIEN) 10 MG tablet TAKE 1 TABLET BY MOUTH EVERY DAY AT BEDTIME 30 tablet 0   No current  facility-administered medications on file prior to visit.     BP 124/88 (BP Location: Left Arm, Patient Position: Sitting, Cuff Size: Large)   Pulse 78   Temp 98 F (36.7 C) (Oral)   Wt 196 lb (88.9 kg)   SpO2 98%   BMI 33.12 kg/m       Objective:   Physical Exam  Constitutional: She appears well-developed and well-nourished.  No distress.  HENT:  Right Ear: Hearing, tympanic membrane, external ear and ear canal normal.  Left Ear: Hearing, tympanic membrane, external ear and ear canal normal.  Nose: Mucosal edema and rhinorrhea present. Right sinus exhibits frontal sinus tenderness. Right sinus exhibits no maxillary sinus tenderness. Left sinus exhibits frontal sinus tenderness. Left sinus exhibits no maxillary sinus tenderness.  Mouth/Throat: Uvula is midline, oropharynx is clear and moist and mucous membranes are normal.  Cardiovascular: Normal rate, regular rhythm, normal heart sounds and intact distal pulses.  Pulmonary/Chest: Effort normal and breath sounds normal.  Skin: She is not diaphoretic.  Nursing note and vitals reviewed.     Assessment & Plan:  1. Acute non-recurrent frontal sinusitis - doxycycline (VIBRAMYCIN) 100 MG capsule; Take 1 capsule (100 mg total) by mouth 2 (two) times daily.  Dispense: 14 capsule; Refill: 0 - Stay hydrated and rest - Can use Floanse or similar nasal spray  - Follow up if no improvement in the next two days   Shirline Frees, NP

## 2018-05-01 ENCOUNTER — Telehealth: Payer: Self-pay | Admitting: Family Medicine

## 2018-05-01 NOTE — Telephone Encounter (Signed)
Pt aware to follow up with new PCP for further refills due to Dr.Kwiakowski's retiring.  Okay for refill? Please advise

## 2018-05-01 NOTE — Telephone Encounter (Signed)
Copied from CRM [id number]. Topic: General - Other >> May 01, 2018 10:52 AM Ronney Lion A wrote: Reason for CRM: Patient needs a refill on her medication zolpidem (AMBIEN) 10 MG tablet sent to the Pharmacy on File.

## 2018-05-04 ENCOUNTER — Other Ambulatory Visit: Payer: Self-pay | Admitting: Family Medicine

## 2018-05-04 MED ORDER — ZOLPIDEM TARTRATE 10 MG PO TABS: 10.0000 mg | ORAL_TABLET | Freq: Every day | ORAL | 1 refills | Status: DC

## 2018-05-06 ENCOUNTER — Telehealth: Payer: Self-pay | Admitting: *Deleted

## 2018-05-06 NOTE — Telephone Encounter (Signed)
Prior auth for Zolpidem tartrate 10mg  sent to [email or web address].

## 2018-05-11 ENCOUNTER — Encounter: Payer: Self-pay | Admitting: Family Medicine

## 2018-05-11 ENCOUNTER — Ambulatory Visit: Payer: Medicare Other | Admitting: Family Medicine

## 2018-05-11 VITALS — BP 128/78 | HR 80 | Temp 97.5°F | Wt 197.0 lb

## 2018-05-11 DIAGNOSIS — J329 Chronic sinusitis, unspecified: Secondary | ICD-10-CM | POA: Diagnosis not present

## 2018-05-11 MED ORDER — CEFDINIR 300 MG PO CAPS: 300.0000 mg | ORAL_CAPSULE | Freq: Two times a day (BID) | ORAL | 0 refills | Status: AC

## 2018-05-11 NOTE — Progress Notes (Signed)
Subjective:    Patient ID: Leslie [name], female    DOB: [birth year], 67 y.o.   MRN: [id number]  No chief complaint on file.   HPI Patient was seen today for acute concern.  Patient endorses ongoing facial pain and headache since August/September.  Pt notes history of sinusitis given Augmentin at UC and Doxycycline by C. Nafziger, NP.  Pt notes continued L sided facial pain and HA.  Pt notes foul smelling nasal drainage, stuffy nose.  Has tried pseudafed, flonase and saline nasal spray.  Denies fever, tooth pain, ear pain or pressure.  Past Medical History:  Diagnosis Date  . ADD (attention deficit disorder)   . Allergy   . Arthritis 2016   osteoarthritis left knee  . Colon polyps   . Hypertension   . Left knee pain    chronic  . Narcotic addiction (HCC)   . Obesity    post gastric bypass  . Vitamin D deficiency     Allergies  Allergen Reactions  . Sulfamethoxazole Nausea And Vomiting    See patient list of med intolerances due to h/o gastric bypass and nephrectomy    ROS General: Denies fever, chills, night sweats, changes in weight, changes in appetite HEENT: Denies ear pain, changes in vision, rhinorrhea, sore throat  +HA, drainage, L sided facial pain CV: Denies CP, palpitations, SOB, orthopnea Pulm: Denies SOB, cough, wheezing GI: Denies abdominal pain, nausea, vomiting, diarrhea, constipation GU: Denies dysuria, hematuria, frequency, vaginal discharge Msk: Denies muscle cramps, joint pains Neuro: Denies weakness, numbness, tingling Skin: Denies rashes, bruising Psych: Denies depression, anxiety, hallucinations     Objective:    Blood pressure 128/78, pulse 80, temperature (!) 97.5 F (36.4 C), temperature source Oral, weight 197 lb (89.4 kg), SpO2 98 %.   Gen. Pleasant, well-nourished, in no distress, normal affect   HEENT: [place]/AT, face symmetric, no scleral icterus, PERRLA, nares patent without drainage, pharynx without erythema or exudate.  TTP of L  maxillary and frontal sinuses.  TMs full b/l.  No cervical lymphadenopathy. Lungs: no accessory muscle use, CTAB, no wheezes or rales Cardiovascular: RRR, no m/r/g, no peripheral edema   Wt Readings from Last 3 Encounters:  05/11/18 197 lb (89.4 kg)  04/10/18 196 lb (88.9 kg)  01/05/18 201 lb (91.2 kg)    Lab Results  Component Value Date   WBC 7.0 01/05/2018   HGB 13.8 01/05/2018   HCT 40.5 01/05/2018   PLT 336.0 01/05/2018   GLUCOSE 101 (H) 01/05/2018   CHOL 222 (H) 01/05/2018   TRIG 75.0 01/05/2018   HDL 70.80 01/05/2018   LDLDIRECT 109.1 07/27/2008   LDLCALC 137 (H) 01/05/2018   ALT 12 01/05/2018   AST 14 01/05/2018   NA 134 (L) 01/05/2018   K 4.1 01/05/2018   CL 97 01/05/2018   CREATININE 1.08 01/05/2018   BUN 16 01/05/2018   CO2 27 01/05/2018   TSH 1.12 01/05/2018   INR 1.79 (H) 06/19/2015    Assessment/Plan:  Recurrent sinusitis  -pt advised to try nasacort or other nasal spray.  Saline nasal rinse is ok to continue using. -will change abx.  If pt has continued symptoms consider referral to ENT/ CT of sinuses. - Plan: cefdinir (OMNICEF) 300 MG capsule  F/u prn with pcp.  Abbe Amsterdam, MD

## 2018-05-11 NOTE — Patient Instructions (Signed)

## 2018-05-13 NOTE — Telephone Encounter (Signed)
Fax received from [facility] stating the request was denied and this was given to Dr Nelida Meuse asst.

## 2018-05-13 NOTE — Telephone Encounter (Signed)
Noted  

## 2018-05-24 ENCOUNTER — Other Ambulatory Visit (INDEPENDENT_AMBULATORY_CARE_PROVIDER_SITE_OTHER): Payer: Self-pay

## 2018-05-25 MED ORDER — TRAMADOL HCL 50 MG PO TABS: 50.0000 mg | ORAL_TABLET | Freq: Four times a day (QID) | ORAL | 0 refills | Status: DC | PRN

## 2018-05-25 NOTE — Telephone Encounter (Signed)
Please advise 

## 2018-05-25 NOTE — Telephone Encounter (Signed)
Gil patient 

## 2018-06-12 ENCOUNTER — Other Ambulatory Visit (INDEPENDENT_AMBULATORY_CARE_PROVIDER_SITE_OTHER): Payer: Self-pay | Admitting: Physician Assistant

## 2018-06-12 NOTE — Telephone Encounter (Signed)
Please advise 

## 2018-06-24 ENCOUNTER — Encounter: Payer: Medicare Other | Admitting: Internal Medicine

## 2018-06-29 ENCOUNTER — Telehealth: Payer: Self-pay | Admitting: Internal Medicine

## 2018-06-29 NOTE — Telephone Encounter (Signed)
TOC appt w/Hernandez 07/10/18

## 2018-06-29 NOTE — Telephone Encounter (Signed)
Copied from CRM [id number]. Topic: Quick Communication - Rx Refill/Question >> Jun 29, 2018  2:44 PM Burchel, Abbi R wrote: Medication: zolpidem (AMBIEN) 10 MG tablet   [phone][id number] - [place], [place] - [address] [facility] [address] [place] [id number] Phone: [phone] Fax: [phone]  Pt was advised that RX refills may take up to 3 business days. We ask that you follow-up with your pharmacy.

## 2018-06-30 MED ORDER — ZOLPIDEM TARTRATE 10 MG PO TABS: 10.0000 mg | ORAL_TABLET | Freq: Every day | ORAL | 0 refills | Status: DC

## 2018-06-30 NOTE — Telephone Encounter (Signed)
Okay to fill until appointment?

## 2018-06-30 NOTE — Telephone Encounter (Signed)
Rx called in 

## 2018-06-30 NOTE — Telephone Encounter (Signed)
10 tabs till appointment only. We will discuss continued use then.

## 2018-07-01 ENCOUNTER — Telehealth: Payer: Self-pay | Admitting: Internal Medicine

## 2018-07-01 NOTE — Telephone Encounter (Signed)
Copied from CRM [id number]. Topic: Quick Communication - See Telephone Encounter >> Jul 01, 2018 11:59 AM Stephannie LiSimmons, Nazaria Riesen L, NT wrote: CRM for notification. See Telephone encounter for: 07/01/18. Patient called and states she spoke with the pharmacy ,and they have told her that  they have not received the prescription for  zolpidem (AMBIEN) 10 MG tablet   please advise

## 2018-07-02 NOTE — Telephone Encounter (Signed)
Spoke with pharmacist and Rx called in.

## 2018-07-06 ENCOUNTER — Other Ambulatory Visit (INDEPENDENT_AMBULATORY_CARE_PROVIDER_SITE_OTHER): Payer: Self-pay | Admitting: Orthopaedic Surgery

## 2018-07-06 MED ORDER — TRAMADOL HCL 50 MG PO TABS: 50.0000 mg | ORAL_TABLET | Freq: Four times a day (QID) | ORAL | 0 refills | Status: DC | PRN

## 2018-07-06 NOTE — Telephone Encounter (Signed)
LMOM notifying patient.

## 2018-07-06 NOTE — Telephone Encounter (Signed)
Ok to refill 

## 2018-07-10 ENCOUNTER — Ambulatory Visit: Payer: Medicare Other | Admitting: Internal Medicine

## 2018-07-10 ENCOUNTER — Telehealth: Payer: Self-pay | Admitting: Internal Medicine

## 2018-07-10 ENCOUNTER — Encounter: Payer: Self-pay | Admitting: Internal Medicine

## 2018-07-10 VITALS — BP 130/90 | HR 76 | Temp 97.8°F | Ht 65.0 in | Wt 195.4 lb

## 2018-07-10 DIAGNOSIS — G479 Sleep disorder, unspecified: Secondary | ICD-10-CM

## 2018-07-10 DIAGNOSIS — I10 Essential (primary) hypertension: Secondary | ICD-10-CM

## 2018-07-10 DIAGNOSIS — M1711 Unilateral primary osteoarthritis, right knee: Secondary | ICD-10-CM | POA: Diagnosis not present

## 2018-07-10 DIAGNOSIS — F900 Attention-deficit hyperactivity disorder, predominantly inattentive type: Secondary | ICD-10-CM | POA: Diagnosis not present

## 2018-07-10 DIAGNOSIS — J320 Chronic maxillary sinusitis: Secondary | ICD-10-CM | POA: Diagnosis not present

## 2018-07-10 MED ORDER — AMPHETAMINE-DEXTROAMPHETAMINE 20 MG PO TABS: ORAL_TABLET | ORAL | 0 refills | Status: DC

## 2018-07-10 MED ORDER — AMPHETAMINE-DEXTROAMPHET ER 30 MG PO CP24: 30.0000 mg | ORAL_CAPSULE | ORAL | 0 refills | Status: DC

## 2018-07-10 MED ORDER — ZOLPIDEM TARTRATE 10 MG PO TABS: 10.0000 mg | ORAL_TABLET | Freq: Every day | ORAL | 2 refills | Status: DC

## 2018-07-10 NOTE — Patient Instructions (Addendum)
-  It was nice meeting you today!  -I will plan to see you back in 3 months.  -Please remember to follow up with the [facility]. Continued Adderall prescription will be contingent on this follow up.  -We will discuss an Ambien weaning schedule at our next visit.  -If you have the chance to check your blood pressure before you return, please write it down and bring those measurements in to your next visit with me. If blood pressure still elevated at next visit, we will need to discuss change in therapy.  -ENT referral has been placed.

## 2018-07-10 NOTE — Telephone Encounter (Signed)
[employer] agent Clydie BraunKaren, states that pt is calling stating that she is currently at the pharmacy and prescription for Zolpidem(ambien) 10 mg was not received. Per prescription in the chart it was noted that the medication was phoned in on today. Contacted FC, Sheena who states she will get someone to look at the medication to send to the pharmacy. Notified [facility] agent Clydie BraunKaren to make the pt aware that refill was in the process of being resent to pharmacy.

## 2018-07-10 NOTE — Progress Notes (Signed)
Established Patient Office Visit     CC/Reason for Visit: Establish care, medication refills, follow-up on chronic medical conditions  HPI: Leslie [name] is a 67 y.o. female who is coming in today for the above mentioned reasons.  Due for annual physical exam in June 2020.  Past Medical History is significant for: A history of ADHD that has been prescribed Adderall 30 mg extended release with an additional 20 mg to take as needed, of which she takes about 4 times a week.  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  She also has a sleep disorder and has been maintained on Ambien 10 mg.  Recently Dr. Kirtland Bouchard try to decrease it to 5 mg but she was having significant difficulty sleeping.  Without Ambien she will not fall asleep until 5 or 6:00 in the morning.  When she takes 10 mg of Ambien she can sleep 5 to 6 hours.  She is also prescribed Prozac and Lamictal for presumed depression (although this is not listed in her history) and hydrochlorothiazide for hypertension.  She has not seen a behavioral health provider in over 10 years.  She needs refills of her Adderall and Ambien today.  She complains today of a chronic sinus infection.  She has been dealing with this since August.  She has been seen in this clinic 2 times prior for this issue and has also been to urgent care.  Pain is in her left maxillary sinus.  She states she has a putrid smell to her breath.  Has "chunky green stuff" coming out of her nose.  She has tried 3 different courses of antibiotics in addition to every over-the-counter remedy including saline nasal spray, and Nettie pot, cold and flu medication without relief.   Past Medical/Surgical History: Past Medical History:  Diagnosis Date  . ADD (attention deficit disorder)   . Allergy   . Arthritis 2016   osteoarthritis left knee  . Colon polyps   . Hypertension   . Left knee pain    chronic  . Narcotic addiction (HCC)    . Obesity    post gastric bypass  . Vitamin D deficiency     Past Surgical History:  Procedure Laterality Date  . arthscopic knee surgery Left    several times  . BREAST BIOPSY    . BREAST SURGERY  years ago   breast biopsy  . CHOLECYSTECTOMY    . EYE SURGERY Bilateral 2013   Toric Lens implants  . EYE SURGERY Right 2014   corneal amniotic membrane  . GASTRIC BYPASS  2003  . KIDNEY DONATION  2004  . TOTAL KNEE ARTHROPLASTY Left 06/16/2015   Procedure: LEFT TOTAL KNEE ARTHROPLASTY;  Surgeon: Kathryne Hitch, MD;  Location: [facility];  Service: Orthopedics;  Laterality: Left;    Social History:  reports that she quit smoking about 25 years ago. She has never used smokeless tobacco. She reports current alcohol use. She reports that she does not use drugs.  Allergies: Allergies  Allergen Reactions  . Sulfamethoxazole Nausea And Vomiting    See patient list of med intolerances due to h/o gastric bypass and nephrectomy    Family History:  Family History  Problem Relation Age of Onset  . Heart disease Mother        post CABG history of CHF  . COPD Father   . Diabetes Sister   . Hypertension Sister  post renal transplant  . Heart disease Brother        CAD     Current Outpatient Medications:  .  amphetamine-dextroamphetamine (ADDERALL XR) 30 MG 24 hr capsule, Take 1 capsule (30 mg total) by mouth every morning., Disp: 30 capsule, Rfl: 0 .  amphetamine-dextroamphetamine (ADDERALL XR) 30 MG 24 hr capsule, Take 1 capsule (30 mg total) by mouth every morning. Fill in one month, Disp: 30 capsule, Rfl: 0 .  amphetamine-dextroamphetamine (ADDERALL XR) 30 MG 24 hr capsule, Take 1 capsule (30 mg total) by mouth every morning., Disp: 30 capsule, Rfl: 0 .  amphetamine-dextroamphetamine (ADDERALL) 20 MG tablet, Take 1/2 to 1 tablet by mouth every evening if needed., Disp: 20 tablet, Rfl: 0 .  amphetamine-dextroamphetamine (ADDERALL) 20 MG tablet, take 1/2 to 1 tablet by  mouth every evening if needed, Disp: 20 tablet, Rfl: 0 .  amphetamine-dextroamphetamine (ADDERALL) 20 MG tablet, Take 1/2 to 1 tablet by mouth every evening if needed., Disp: 20 tablet, Rfl: 0 .  Ascorbic Acid (VITAMIN C) 1000 MG tablet, Take 1,000 mg by mouth daily. , Disp: , Rfl:  .  calcium-vitamin D (SM CALCIUM 500/VITAMIN D3) 500-400 MG-UNIT tablet, Take 1 tablet by mouth daily. , Disp: , Rfl:  .  FLUoxetine (PROZAC) 40 MG capsule, Take 1 capsule (40 mg total) by mouth every morning., Disp: 90 capsule, Rfl: 3 .  hydrochlorothiazide (HYDRODIURIL) 25 MG tablet, Take 1 tablet (25 mg total) by mouth daily., Disp: 90 tablet, Rfl: 3 .  lamoTRIgine (LAMICTAL) 100 MG tablet, Take 1 tablet (100 mg total) by mouth 2 (two) times daily., Disp: 180 tablet, Rfl: 2 .  Polyethyl Glyc-Propyl Glyc PF (SYSTANE PRESERVATIVE FREE) 0.4-0.3 % SOLN, Apply to eye., Disp: , Rfl:  .  triamcinolone (NASACORT) 55 MCG/ACT nasal inhaler, Place 2 sprays into the nose daily. (Patient taking differently: Place 2 sprays into the nose daily as needed (allergies). ), Disp: 1 Inhaler, Rfl: 6 .  zolpidem (AMBIEN) 10 MG tablet, Take 1 tablet (10 mg total) by mouth at bedtime., Disp: 30 tablet, Rfl: 2  Review of Systems:  Constitutional: Denies fever, chills, diaphoresis, appetite change and fatigue.  HEENT: Denies photophobia, eye pain, redness, hearing loss, ear pain, sneezing, mouth sores, trouble swallowing, neck pain, neck stiffness and tinnitus.   Respiratory: Denies SOB, DOE, cough, chest tightness,  and wheezing.   Cardiovascular: Denies chest pain, palpitations and leg swelling.  Gastrointestinal: Denies nausea, vomiting, abdominal pain, diarrhea, constipation, blood in stool and abdominal distention.  Genitourinary: Denies dysuria, urgency, frequency, hematuria, flank pain and difficulty urinating.  Endocrine: Denies: hot or cold intolerance, sweats, changes in hair or nails, polyuria, polydipsia. Musculoskeletal: Denies  myalgias, back pain, joint swelling, arthralgias and gait problem.  Skin: Denies pallor, rash and wound.  Neurological: Denies dizziness, seizures, syncope, weakness, light-headedness, numbness and headaches.  Hematological: Denies adenopathy. Easy bruising, personal or family bleeding history  Psychiatric/Behavioral: Denies suicidal ideation, mood changes, confusion, nervousness, agitation.    Physical Exam: Vitals:   07/10/18 0927  BP: 130/90  Pulse: 76  Temp: 97.8 F (36.6 C)  TempSrc: Oral  SpO2: 99%  Weight: 195 lb 6.4 oz (88.6 kg)  Height: 5\' 5"  (1.651 m)    Body mass index is 32.52 kg/m.   Constitutional: NAD, calm, comfortable Eyes: PERRL, lids and conjunctivae normal ENMT: Mucous membranes are moist. Posterior pharynx clear of any exudate or lesions. Normal dentition. Tympanic membrane is pearly white, no erythema or bulging. Respiratory: clear to auscultation bilaterally,  no wheezing, no crackles. Normal respiratory effort. No accessory muscle use.  Cardiovascular: Regular rate and rhythm, no murmurs / rubs / gallops. No extremity edema. 2+ pedal pulses. No carotid bruits.  Musculoskeletal: no clubbing / cyanosis. No joint deformity upper and lower extremities. Good ROM, no contractures. Normal muscle tone.  Neurologic: Grossly intact and nonfocal Psychiatric: Normal judgment and insight. Alert and oriented x 3. Normal mood.    Impression and Plan:  Attention deficit hyperactivity disorder (ADHD), predominantly inattentive type  -We have discussed this diagnosis and treatment in detail today. -Have advised her that medication management is only a small puzzle piece within the spectrum of treating ADHD. -She has not seen a behavioral health counselor or specialist in regards to this issue ever. -Have told her that I do not mind being the primary prescriber for her Adderall as long as she is adequately evaluated by a specialized attention deficit clinic and has other  therapies including CBT as pertinent to her diagnosis. -Details for the [facility] have been given to her today. -She has been advised that I will refill her Adderall for 3 months but she will need to have either been seen in the clinic or she will have to show proof that she has made an appointment to this clinic before further refills will be entertained.  Essential hypertension -Blood pressure is high today, 140/90 on repeated measurement. -She admits to anxiety in regards to meeting a new provider. -I will give her a 4935-month grace period. -She has been advised to do ambulatory blood pressure monitoring. -If at her return visit in 3 months blood pressure remains elevated, we will need to entertain change in therapy.  Unilateral primary osteoarthritis, right knee -She is planning on having her right knee replaced early next year.  Chronic maxillary sinusitis  -This is now been ongoing for 3 to 4 months without significant improvement with over-the-counter remedies and 3 different courses of antibiotics. -We will initiate ENT referral.  Sleep disorder  -We have also discussed this issue in detail today. -We have discussed how her sleep issues may not be a problem in of itself but instead a symptom of her depression and ADHD, and how I believe follow-up with either psychiatrist or behavioral health counselor would be appropriate. -We have discussed drug tolerance as evidenced by escalating Ambien dose. -She does not seem against contemplating an Ambien titration schedule. -Have refilled Ambien 10 mg to be taken every night today, because I would like her to focus on the ADHD for now. -On follow-up visit in 3 months we will initiate Ambien titration schedule.    Patient Instructions  -It was nice meeting you today!  -I will plan to see you back in 3 months.  -Please remember to follow up with the [facility]. Continued Adderall prescription will be  contingent on this follow up.  -We will discuss an Ambien weaning schedule at our next visit.  -If you have the chance to check your blood pressure before you return, please write it down and bring those measurements in to your next visit with me. If blood pressure still elevated at next visit, we will need to discuss change in therapy.  -ENT referral has been placed.     Chaya JanEstela Hernandez Acosta, MD [facility]

## 2018-07-23 ENCOUNTER — Encounter: Payer: Self-pay | Admitting: Internal Medicine

## 2018-07-30 ENCOUNTER — Other Ambulatory Visit (INDEPENDENT_AMBULATORY_CARE_PROVIDER_SITE_OTHER): Payer: Self-pay | Admitting: Orthopaedic Surgery

## 2018-07-30 NOTE — Telephone Encounter (Signed)
Please advise 

## 2018-08-25 ENCOUNTER — Other Ambulatory Visit (INDEPENDENT_AMBULATORY_CARE_PROVIDER_SITE_OTHER): Payer: Self-pay | Admitting: Orthopaedic Surgery

## 2018-08-25 NOTE — Telephone Encounter (Signed)
Please advise 

## 2018-09-25 ENCOUNTER — Other Ambulatory Visit: Payer: Self-pay | Admitting: Otolaryngology

## 2018-09-30 ENCOUNTER — Other Ambulatory Visit (INDEPENDENT_AMBULATORY_CARE_PROVIDER_SITE_OTHER): Payer: Self-pay | Admitting: Orthopaedic Surgery

## 2018-09-30 MED ORDER — ACETAMINOPHEN-CODEINE #3 300-30 MG PO TABS: ORAL_TABLET | ORAL | 0 refills | Status: DC

## 2018-09-30 NOTE — Telephone Encounter (Signed)
At this point, she needs to back off on this medication, because it is still a narcotic and addicting over time.  I'll provide it one more time for now.

## 2018-09-30 NOTE — Telephone Encounter (Signed)
I will send some more in.  Please let her know that I need to stop sending these in because it is still a narcotic and addicting and I need to try to get her off of this medication at this point.

## 2018-09-30 NOTE — Telephone Encounter (Signed)
Left voicemail on patients mobile number that Rx has been sent to pharmacy and that Dr. Magnus Ivan will need to stop sending this medication in after this Rx

## 2018-09-30 NOTE — Telephone Encounter (Signed)
Ok to rf? 

## 2018-10-01 ENCOUNTER — Encounter: Payer: Self-pay | Admitting: Internal Medicine

## 2018-10-01 ENCOUNTER — Encounter (INDEPENDENT_AMBULATORY_CARE_PROVIDER_SITE_OTHER): Payer: Self-pay | Admitting: Orthopaedic Surgery

## 2018-10-02 ENCOUNTER — Other Ambulatory Visit (INDEPENDENT_AMBULATORY_CARE_PROVIDER_SITE_OTHER): Payer: Self-pay | Admitting: Orthopaedic Surgery

## 2018-10-02 ENCOUNTER — Other Ambulatory Visit: Payer: Self-pay | Admitting: Internal Medicine

## 2018-10-02 DIAGNOSIS — G479 Sleep disorder, unspecified: Secondary | ICD-10-CM

## 2018-10-02 MED ORDER — ZOLPIDEM TARTRATE 10 MG PO TABS: 10.0000 mg | ORAL_TABLET | Freq: Every day | ORAL | 2 refills | Status: DC

## 2018-10-02 MED ORDER — ACETAMINOPHEN-CODEINE #3 300-30 MG PO TABS: ORAL_TABLET | ORAL | 0 refills | Status: DC

## 2018-10-02 MED ORDER — ACETAMINOPHEN-CODEINE #3 300-30 MG PO TABS: 1.0000 | ORAL_TABLET | Freq: Three times a day (TID) | ORAL | 0 refills | Status: DC | PRN

## 2018-10-05 NOTE — Telephone Encounter (Signed)
Pt called and stated that pharmacy did not receive medication. Please advise Cb#[phone]

## 2018-10-05 NOTE — Telephone Encounter (Signed)
Pt calling to check status. Pt is completely out

## 2018-10-06 ENCOUNTER — Telehealth: Payer: Self-pay | Admitting: *Deleted

## 2018-10-06 ENCOUNTER — Encounter: Payer: Self-pay | Admitting: Internal Medicine

## 2018-10-06 NOTE — Telephone Encounter (Signed)
Copied from CRM [id number]. Topic: General - Other >> Oct 06, 2018  2:53 PM Mcneil, Ja-Kwan wrote: Reason for CRM: Pt stated she contacted the pharmacy again today and she was advised that the refill request for zolpidem (AMBIEN) 10 MG tablet had not been received. Pt stated she does not know what the problem is but she is out of her medication and she needs Dr. Ardyth Harps to contact the pharmacy to approve the refill request. Pt requests call back. Cb# [phone]

## 2018-10-06 NOTE — Telephone Encounter (Signed)
I personally spoke with the pharmacist and refill was called in today. A message was sent to MyChart

## 2018-10-09 ENCOUNTER — Encounter: Payer: Self-pay | Admitting: Internal Medicine

## 2018-10-09 ENCOUNTER — Other Ambulatory Visit: Payer: Self-pay | Admitting: Internal Medicine

## 2018-10-09 DIAGNOSIS — F900 Attention-deficit hyperactivity disorder, predominantly inattentive type: Secondary | ICD-10-CM

## 2018-10-09 MED ORDER — AMPHETAMINE-DEXTROAMPHETAMINE 20 MG PO TABS: ORAL_TABLET | ORAL | 0 refills | Status: DC

## 2018-10-09 MED ORDER — AMPHETAMINE-DEXTROAMPHET ER 30 MG PO CP24: 30.0000 mg | ORAL_CAPSULE | ORAL | 0 refills | Status: DC

## 2018-10-09 NOTE — Telephone Encounter (Signed)
Patient did have an appointment with [facility] in April, but had to reschedule.

## 2018-10-27 ENCOUNTER — Telehealth: Payer: Self-pay | Admitting: *Deleted

## 2018-10-27 MED ORDER — LAMOTRIGINE 100 MG PO TABS: 100.0000 mg | ORAL_TABLET | Freq: Two times a day (BID) | ORAL | 1 refills | Status: DC

## 2018-10-27 NOTE — Telephone Encounter (Signed)
yes

## 2018-10-27 NOTE — Telephone Encounter (Signed)
lamoTRIgine (LAMICTAL) 100 MG tablet Okay to refill?

## 2018-11-30 ENCOUNTER — Other Ambulatory Visit (INDEPENDENT_AMBULATORY_CARE_PROVIDER_SITE_OTHER): Payer: Self-pay | Admitting: Orthopaedic Surgery

## 2018-11-30 NOTE — Telephone Encounter (Signed)
Please advise 

## 2018-12-02 ENCOUNTER — Other Ambulatory Visit: Payer: Self-pay | Admitting: Internal Medicine

## 2018-12-02 DIAGNOSIS — F900 Attention-deficit hyperactivity disorder, predominantly inattentive type: Secondary | ICD-10-CM

## 2018-12-04 ENCOUNTER — Encounter: Payer: Self-pay | Admitting: Internal Medicine

## 2018-12-04 ENCOUNTER — Other Ambulatory Visit: Payer: Self-pay | Admitting: Internal Medicine

## 2018-12-04 DIAGNOSIS — F900 Attention-deficit hyperactivity disorder, predominantly inattentive type: Secondary | ICD-10-CM

## 2018-12-04 MED ORDER — AMPHETAMINE-DEXTROAMPHETAMINE 20 MG PO TABS: ORAL_TABLET | ORAL | 0 refills | Status: DC

## 2018-12-15 ENCOUNTER — Encounter: Payer: Self-pay | Admitting: Internal Medicine

## 2018-12-16 ENCOUNTER — Other Ambulatory Visit: Payer: Self-pay

## 2018-12-16 ENCOUNTER — Ambulatory Visit (INDEPENDENT_AMBULATORY_CARE_PROVIDER_SITE_OTHER): Payer: Medicare Other | Admitting: Internal Medicine

## 2018-12-16 DIAGNOSIS — F900 Attention-deficit hyperactivity disorder, predominantly inattentive type: Secondary | ICD-10-CM

## 2018-12-16 MED ORDER — AMPHETAMINE-DEXTROAMPHET ER 30 MG PO CP24: 30.0000 mg | ORAL_CAPSULE | ORAL | 0 refills | Status: DC

## 2018-12-16 MED ORDER — AMPHETAMINE-DEXTROAMPHETAMINE 20 MG PO TABS: ORAL_TABLET | ORAL | 0 refills | Status: DC

## 2018-12-16 NOTE — Progress Notes (Signed)
Virtual Visit via Video Note  I connected with Leslie [name] on 12/16/18 at  3:30 PM EDT by a video enabled telemedicine application and verified that I am speaking with the correct person using two identifiers.  Location patient: home Location provider: work office Persons participating in the virtual visit: patient, provider  I discussed the limitations of evaluation and management by telemedicine and the availability of in person appointments. The patient expressed understanding and agreed to proceed.   HPI: This is a scheduled visit for medication refills. She has ADHD and is on Adderall XR 30 mg daily and takes an additional 20 mg tablet if needed thruout the day. She is due for refills today. No complaints.   ROS: Constitutional: Denies fever, chills, diaphoresis, appetite change and fatigue.  HEENT: Denies photophobia, eye pain, redness, hearing loss, ear pain, congestion, sore throat, rhinorrhea, sneezing, mouth sores, trouble swallowing, neck pain, neck stiffness and tinnitus.   Respiratory: Denies SOB, DOE, cough, chest tightness,  and wheezing.   Cardiovascular: Denies chest pain, palpitations and leg swelling.  Gastrointestinal: Denies nausea, vomiting, abdominal pain, diarrhea, constipation, blood in stool and abdominal distention.  Genitourinary: Denies dysuria, urgency, frequency, hematuria, flank pain and difficulty urinating.  Endocrine: Denies: hot or cold intolerance, sweats, changes in hair or nails, polyuria, polydipsia. Musculoskeletal: Denies myalgias, back pain, joint swelling, arthralgias and gait problem.  Skin: Denies pallor, rash and wound.  Neurological: Denies dizziness, seizures, syncope, weakness, light-headedness, numbness and headaches.  Hematological: Denies adenopathy. Easy bruising, personal or family bleeding history  Psychiatric/Behavioral: Denies suicidal ideation, mood changes, confusion, nervousness, sleep disturbance and agitation    Past Medical History:  Diagnosis Date  . ADD (attention deficit disorder)   . Allergy   . Arthritis 2016   osteoarthritis left knee  . Colon polyps   . Hypertension   . Left knee pain    chronic  . Narcotic addiction (HCC)   . Obesity    post gastric bypass  . Vitamin D deficiency     Past Surgical History:  Procedure Laterality Date  . arthscopic knee surgery Left    several times  . BREAST BIOPSY    . BREAST SURGERY  years ago   breast biopsy  . CHOLECYSTECTOMY    . EYE SURGERY Bilateral 2013   Toric Lens implants  . EYE SURGERY Right 2014   corneal amniotic membrane  . GASTRIC BYPASS  2003  . KIDNEY DONATION  2004  . TOTAL KNEE ARTHROPLASTY Left 06/16/2015   Procedure: LEFT TOTAL KNEE ARTHROPLASTY;  Surgeon: Kathryne Hitch, MD;  Location: [facility];  Service: Orthopedics;  Laterality: Left;    Family History  Problem Relation Age of Onset  . Heart disease Mother        post CABG history of CHF  . COPD Father   . Diabetes Sister   . Hypertension Sister        post renal transplant  . Heart disease Brother        CAD    SOCIAL HX:   reports that she quit smoking about 26 years ago. She has never used smokeless tobacco. She reports current alcohol use. She reports that she does not use drugs.   Current Outpatient Medications:  .  acetaminophen-codeine (TYLENOL #3) 300-30 MG tablet, Take 1-2 tablets by mouth every 8 (eight) hours as needed for moderate pain., Disp: 30 tablet, Rfl: 0 .  amphetamine-dextroamphetamine (ADDERALL XR) 30 MG 24 hr capsule, Take 1 capsule (  30 mg total) by mouth every morning., Disp: 30 capsule, Rfl: 0 .  amphetamine-dextroamphetamine (ADDERALL XR) 30 MG 24 hr capsule, Take 1 capsule (30 mg total) by mouth every morning., Disp: 30 capsule, Rfl: 0 .  amphetamine-dextroamphetamine (ADDERALL XR) 30 MG 24 hr capsule, Take 1 capsule (30 mg total) by mouth every morning. Fill in one month, Disp: 30 capsule, Rfl: 0 .   amphetamine-dextroamphetamine (ADDERALL) 20 MG tablet, Take 1/2 to 1 tablet by mouth every evening if needed., Disp: 20 tablet, Rfl: 0 .  amphetamine-dextroamphetamine (ADDERALL) 20 MG tablet, Take 1/2 to 1 tablet by mouth every evening if needed., Disp: 20 tablet, Rfl: 0 .  amphetamine-dextroamphetamine (ADDERALL) 20 MG tablet, take 1/2 to 1 tablet by mouth every evening if needed, Disp: 20 tablet, Rfl: 0 .  Ascorbic Acid (VITAMIN C) 1000 MG tablet, Take 1,000 mg by mouth daily. , Disp: , Rfl:  .  calcium-vitamin D (SM CALCIUM 500/VITAMIN D3) 500-400 MG-UNIT tablet, Take 1 tablet by mouth daily. , Disp: , Rfl:  .  FLUoxetine (PROZAC) 40 MG capsule, Take 1 capsule (40 mg total) by mouth every morning., Disp: 90 capsule, Rfl: 3 .  hydrochlorothiazide (HYDRODIURIL) 25 MG tablet, Take 1 tablet (25 mg total) by mouth daily., Disp: 90 tablet, Rfl: 3 .  lamoTRIgine (LAMICTAL) 100 MG tablet, Take 1 tablet (100 mg total) by mouth 2 (two) times daily., Disp: 180 tablet, Rfl: 1 .  Polyethyl Glyc-Propyl Glyc PF (SYSTANE PRESERVATIVE FREE) 0.4-0.3 % SOLN, Apply to eye., Disp: , Rfl:  .  traMADol (ULTRAM) 50 MG tablet, TAKE 1 TO 2 TABLETS(50 TO 100 MG) BY MOUTH EVERY 6 HOURS AS NEEDED, Disp: 60 tablet, Rfl: 0 .  triamcinolone (NASACORT) 55 MCG/ACT nasal inhaler, Place 2 sprays into the nose daily. (Patient taking differently: Place 2 sprays into the nose daily as needed (allergies). ), Disp: 1 Inhaler, Rfl: 6 .  zolpidem (AMBIEN) 10 MG tablet, Take 1 tablet (10 mg total) by mouth at bedtime., Disp: 30 tablet, Rfl: 2  EXAM:   VITALS per patient if applicable: none reported  GENERAL: alert, oriented, appears well and in no acute distress  HEENT: atraumatic, conjunttiva clear, no obvious abnormalities on inspection of external nose and ears  NECK: normal movements of the head and neck  LUNGS: on inspection no signs of respiratory distress, breathing rate appears normal, no obvious gross increased work of  breathing, gasping or wheezing  CV: no obvious cyanosis  MS: moves all visible extremities without noticeable abnormality  PSYCH/NEURO: pleasant and cooperative, no obvious depression or anxiety, speech and thought processing grossly intact  ASSESSMENT AND PLAN:   Attention deficit hyperactivity disorder (ADHD), predominantly inattentive type  -Adderall refills today. -PDMP reviewed: no red flags.     I discussed the assessment and treatment plan with the patient. The patient was provided an opportunity to ask questions and all were answered. The patient agreed with the plan and demonstrated an understanding of the instructions.   The patient was advised to call back or seek an in-person evaluation if the symptoms worsen or if the condition fails to improve as anticipated.    Chaya JanEstela Hernandez Acosta, MD  [facility]

## 2018-12-28 ENCOUNTER — Other Ambulatory Visit: Payer: Self-pay | Admitting: Internal Medicine

## 2018-12-28 DIAGNOSIS — G479 Sleep disorder, unspecified: Secondary | ICD-10-CM

## 2019-02-02 ENCOUNTER — Encounter: Payer: Self-pay | Admitting: Internal Medicine

## 2019-02-02 DIAGNOSIS — F900 Attention-deficit hyperactivity disorder, predominantly inattentive type: Secondary | ICD-10-CM

## 2019-02-02 MED ORDER — AMPHETAMINE-DEXTROAMPHET ER 30 MG PO CP24: 30.0000 mg | ORAL_CAPSULE | ORAL | 0 refills | Status: DC

## 2019-02-04 ENCOUNTER — Encounter: Payer: Self-pay | Admitting: Internal Medicine

## 2019-02-10 ENCOUNTER — Encounter: Payer: Self-pay | Admitting: Internal Medicine

## 2019-02-11 ENCOUNTER — Encounter: Payer: Self-pay | Admitting: Internal Medicine

## 2019-02-12 ENCOUNTER — Telehealth: Payer: Self-pay | Admitting: Internal Medicine

## 2019-02-12 MED ORDER — FLUOXETINE HCL 40 MG PO CAPS: 40.0000 mg | ORAL_CAPSULE | Freq: Every morning | ORAL | 0 refills | Status: DC

## 2019-02-12 NOTE — Telephone Encounter (Signed)
Medication Refill - Medication: FLUoxetine (PROZAC) 40 MG capsule / Pt stated she has been out of of medication for a week.   Has the patient contacted their pharmacy? Yes.   (Agent: If no, request that the patient contact the pharmacy for the refill.) (Agent: If yes, when and what did the pharmacy advise?)  Preferred Pharmacy (with phone number or street name):  [facility] [address], [address] AT [address] [phone] (Phone) [phone] (Fax)     Agent: Please be advised that RX refills may take up to 3 business days. We ask that you follow-up with your pharmacy.

## 2019-02-24 ENCOUNTER — Other Ambulatory Visit (INDEPENDENT_AMBULATORY_CARE_PROVIDER_SITE_OTHER): Payer: Self-pay | Admitting: Orthopaedic Surgery

## 2019-02-25 ENCOUNTER — Other Ambulatory Visit: Payer: Self-pay | Admitting: Orthopedic Surgery

## 2019-02-25 MED ORDER — TRAMADOL HCL 50 MG PO TABS: ORAL_TABLET | ORAL | 0 refills | Status: DC

## 2019-02-25 NOTE — Telephone Encounter (Signed)
rx sent

## 2019-02-25 NOTE — Telephone Encounter (Signed)
Can you advise and refill for [facility]?

## 2019-03-05 ENCOUNTER — Encounter: Payer: Self-pay | Admitting: Internal Medicine

## 2019-03-09 ENCOUNTER — Other Ambulatory Visit: Payer: Self-pay | Admitting: Internal Medicine

## 2019-03-09 DIAGNOSIS — F900 Attention-deficit hyperactivity disorder, predominantly inattentive type: Secondary | ICD-10-CM

## 2019-03-11 ENCOUNTER — Encounter: Payer: Self-pay | Admitting: Internal Medicine

## 2019-03-12 ENCOUNTER — Other Ambulatory Visit: Payer: Self-pay

## 2019-03-12 ENCOUNTER — Ambulatory Visit (INDEPENDENT_AMBULATORY_CARE_PROVIDER_SITE_OTHER): Payer: Medicare Other | Admitting: Internal Medicine

## 2019-03-12 DIAGNOSIS — F900 Attention-deficit hyperactivity disorder, predominantly inattentive type: Secondary | ICD-10-CM | POA: Diagnosis not present

## 2019-03-12 MED ORDER — AMPHETAMINE-DEXTROAMPHET ER 30 MG PO CP24: 30.0000 mg | ORAL_CAPSULE | ORAL | 0 refills | Status: DC

## 2019-03-12 MED ORDER — AMPHETAMINE-DEXTROAMPHETAMINE 20 MG PO TABS: ORAL_TABLET | ORAL | 0 refills | Status: DC

## 2019-03-12 NOTE — Progress Notes (Signed)
Virtual Visit via Video Note  I connected with Leslie [name] on 03/12/19 at 11:00 AM EDT by a video enabled telemedicine application and verified that I am speaking with the correct person using two identifiers.  Location patient: home Location provider: work office Persons participating in the virtual visit: patient, provider  I discussed the limitations of evaluation and management by telemedicine and the availability of in person appointments. The patient expressed understanding and agreed to proceed.   HPI: This is a scheduled visit for medication refills.  She is on Adderall for ADHD.  She takes a 30 mg tablet in the morning and 20 mg tablet in the afternoon.  She is aware this is a very high dose but she has been on this for years.  She has no red flags on PDMP, overdose risk score is 340.  She has had no changes since our last visit 3 months ago.   ROS: Constitutional: Denies fever, chills, diaphoresis, appetite change and fatigue.  HEENT: Denies photophobia, eye pain, redness, hearing loss, ear pain, congestion, sore throat, rhinorrhea, sneezing, mouth sores, trouble swallowing, neck pain, neck stiffness and tinnitus.   Respiratory: Denies SOB, DOE, cough, chest tightness,  and wheezing.   Cardiovascular: Denies chest pain, palpitations and leg swelling.  Gastrointestinal: Denies nausea, vomiting, abdominal pain, diarrhea, constipation, blood in stool and abdominal distention.  Genitourinary: Denies dysuria, urgency, frequency, hematuria, flank pain and difficulty urinating.  Endocrine: Denies: hot or cold intolerance, sweats, changes in hair or nails, polyuria, polydipsia. Musculoskeletal: Denies myalgias, back pain, joint swelling, arthralgias and gait problem.  Skin: Denies pallor, rash and wound.  Neurological: Denies dizziness, seizures, syncope, weakness, light-headedness, numbness and headaches.  Hematological: Denies adenopathy. Easy bruising, personal or family  bleeding history  Psychiatric/Behavioral: Denies suicidal ideation, mood changes, confusion, nervousness, sleep disturbance and agitation   Past Medical History:  Diagnosis Date  . ADD (attention deficit disorder)   . Allergy   . Arthritis 2016   osteoarthritis left knee  . Colon polyps   . Hypertension   . Left knee pain    chronic  . Narcotic addiction ([facility])   . Obesity    post gastric bypass  . Vitamin D deficiency     Past Surgical History:  Procedure Laterality Date  . arthscopic knee surgery Left    several times  . BREAST BIOPSY    . BREAST SURGERY  years ago   breast biopsy  . CHOLECYSTECTOMY    . EYE SURGERY Bilateral 2013   Toric Lens implants  . EYE SURGERY Right 2014   corneal amniotic membrane  . GASTRIC BYPASS  2003  . KIDNEY DONATION  2004  . TOTAL KNEE ARTHROPLASTY Left 06/16/2015   Procedure: LEFT TOTAL KNEE ARTHROPLASTY;  Surgeon: Mcarthur Rossetti, MD;  Location: [facility];  Service: Orthopedics;  Laterality: Left;    Family History  Problem Relation Age of Onset  . Heart disease Mother        post CABG history of CHF  . COPD Father   . Diabetes Sister   . Hypertension Sister        post renal transplant  . Heart disease Brother        CAD    SOCIAL HX:   reports that she quit smoking about 26 years ago. She has never used smokeless tobacco. She reports current alcohol use. She reports that she does not use drugs.   Current Outpatient Medications:  .  acetaminophen-codeine (TYLENOL #  3) 300-30 MG tablet, Take 1-2 tablets by mouth every 8 (eight) hours as needed for moderate pain., Disp: 30 tablet, Rfl: 0 .  amphetamine-dextroamphetamine (ADDERALL XR) 30 MG 24 hr capsule, Take 1 capsule (30 mg total) by mouth every morning., Disp: 30 capsule, Rfl: 0 .  amphetamine-dextroamphetamine (ADDERALL XR) 30 MG 24 hr capsule, Take 1 capsule (30 mg total) by mouth every morning. Fill in one month, Disp: 30 capsule, Rfl: 0 .   amphetamine-dextroamphetamine (ADDERALL XR) 30 MG 24 hr capsule, Take 1 capsule (30 mg total) by mouth every morning., Disp: 30 capsule, Rfl: 0 .  amphetamine-dextroamphetamine (ADDERALL) 20 MG tablet, Take 1/2 to 1 tablet by mouth every evening if needed., Disp: 30 tablet, Rfl: 0 .  amphetamine-dextroamphetamine (ADDERALL) 20 MG tablet, Take 1/2 to 1 tablet by mouth every evening if needed., Disp: 30 tablet, Rfl: 0 .  amphetamine-dextroamphetamine (ADDERALL) 20 MG tablet, take 1/2 to 1 tablet by mouth every evening if needed, Disp: 30 tablet, Rfl: 0 .  Ascorbic Acid (VITAMIN C) 1000 MG tablet, Take 1,000 mg by mouth daily. , Disp: , Rfl:  .  calcium-vitamin D (SM CALCIUM 500/VITAMIN D3) 500-400 MG-UNIT tablet, Take 1 tablet by mouth daily. , Disp: , Rfl:  .  FLUoxetine (PROZAC) 40 MG capsule, Take 1 capsule (40 mg total) by mouth every morning., Disp: 90 capsule, Rfl: 0 .  hydrochlorothiazide (HYDRODIURIL) 25 MG tablet, Take 1 tablet (25 mg total) by mouth daily., Disp: 90 tablet, Rfl: 3 .  lamoTRIgine (LAMICTAL) 100 MG tablet, Take 1 tablet (100 mg total) by mouth 2 (two) times daily., Disp: 180 tablet, Rfl: 1 .  traMADol (ULTRAM) 50 MG tablet, TAKE 1 TO 2 TABLETS(50 TO 100 MG) BY MOUTH EVERY 6 HOURS AS NEEDED, Disp: 30 tablet, Rfl: 0 .  triamcinolone (NASACORT) 55 MCG/ACT nasal inhaler, Place 2 sprays into the nose daily. (Patient taking differently: Place 2 sprays into the nose daily as needed (allergies). ), Disp: 1 Inhaler, Rfl: 6 .  zolpidem (AMBIEN) 10 MG tablet, TAKE 1 TABLET BY MOUTH EVERY DAY AT BEDTIME, Disp: 30 tablet, Rfl: 2  EXAM:   VITALS per patient if applicable: None reported  GENERAL: alert, oriented, appears well and in no acute distress  HEENT: atraumatic, conjunttiva clear, no obvious abnormalities on inspection of external nose and ears, wears corrective lenses  NECK: normal movements of the head and neck  LUNGS: on inspection no signs of respiratory distress, breathing  rate appears normal, no obvious gross increased work of breathing, gasping or wheezing  CV: no obvious cyanosis  MS: moves all visible extremities without noticeable abnormality  PSYCH/NEURO: pleasant and cooperative, no obvious depression or anxiety, speech and thought processing grossly intact  ASSESSMENT AND PLAN:   Attention deficit hyperactivity disorder (ADHD), predominantly inattentive type -PDMP reviewed. -340-month Adderall refills provided.    I discussed the assessment and treatment plan with the patient. The patient was provided an opportunity to ask questions and all were answered. The patient agreed with the plan and demonstrated an understanding of the instructions.   The patient was advised to call back or seek an in-person evaluation if the symptoms worsen or if the condition fails to improve as anticipated.    Leslie JanEstela Hernandez Acosta, MD  [facility]

## 2019-03-15 ENCOUNTER — Other Ambulatory Visit: Payer: Self-pay | Admitting: Orthopedic Surgery

## 2019-03-15 MED ORDER — TRAMADOL HCL 50 MG PO TABS: ORAL_TABLET | ORAL | 0 refills | Status: DC

## 2019-03-15 NOTE — Telephone Encounter (Signed)
Looks like CB saw this pt last

## 2019-03-15 NOTE — Telephone Encounter (Signed)
Please advise 

## 2019-03-15 NOTE — Telephone Encounter (Signed)
Duda  °

## 2019-03-23 ENCOUNTER — Other Ambulatory Visit: Payer: Self-pay | Admitting: Internal Medicine

## 2019-03-23 DIAGNOSIS — G479 Sleep disorder, unspecified: Secondary | ICD-10-CM

## 2019-03-26 ENCOUNTER — Other Ambulatory Visit: Payer: Self-pay | Admitting: Orthopaedic Surgery

## 2019-03-26 MED ORDER — TRAMADOL HCL 50 MG PO TABS: ORAL_TABLET | ORAL | 0 refills | Status: DC

## 2019-04-07 ENCOUNTER — Encounter: Payer: Self-pay | Admitting: Internal Medicine

## 2019-04-09 ENCOUNTER — Encounter: Payer: Self-pay | Admitting: Internal Medicine

## 2019-04-09 MED ORDER — HYDROCHLOROTHIAZIDE 25 MG PO TABS: 25.0000 mg | ORAL_TABLET | Freq: Every day | ORAL | 1 refills | Status: DC

## 2019-04-14 ENCOUNTER — Other Ambulatory Visit: Payer: Self-pay | Admitting: Orthopaedic Surgery

## 2019-04-14 NOTE — Telephone Encounter (Signed)
Please advise 

## 2019-05-02 ENCOUNTER — Other Ambulatory Visit: Payer: Self-pay | Admitting: Internal Medicine

## 2019-05-06 ENCOUNTER — Other Ambulatory Visit: Payer: Self-pay | Admitting: Orthopaedic Surgery

## 2019-05-06 MED ORDER — TRAMADOL HCL 50 MG PO TABS: ORAL_TABLET | ORAL | 0 refills | Status: DC

## 2019-05-06 NOTE — Telephone Encounter (Signed)
Please advise 

## 2019-05-13 ENCOUNTER — Encounter: Payer: Self-pay | Admitting: Internal Medicine

## 2019-05-13 ENCOUNTER — Other Ambulatory Visit: Payer: Self-pay | Admitting: Internal Medicine

## 2019-05-17 ENCOUNTER — Encounter: Payer: Self-pay | Admitting: Internal Medicine

## 2019-05-17 NOTE — Telephone Encounter (Signed)
Patient is out of current medication zolpidem (AMBIEN) 10 MG tablet    Patient would like to pick up this medication today if possible . She left a few messages on My chart. No response  Please advise.  Pharmacy :  [facility] [place], [place] - [address]  [address] [place] [address]  Phone: [phone] Fax: [phone]

## 2019-05-18 ENCOUNTER — Telehealth (INDEPENDENT_AMBULATORY_CARE_PROVIDER_SITE_OTHER): Payer: Medicare Other | Admitting: Internal Medicine

## 2019-05-18 ENCOUNTER — Encounter: Payer: Self-pay | Admitting: *Deleted

## 2019-05-18 ENCOUNTER — Other Ambulatory Visit: Payer: Self-pay

## 2019-05-18 DIAGNOSIS — F900 Attention-deficit hyperactivity disorder, predominantly inattentive type: Secondary | ICD-10-CM | POA: Diagnosis not present

## 2019-05-18 DIAGNOSIS — G479 Sleep disorder, unspecified: Secondary | ICD-10-CM | POA: Diagnosis not present

## 2019-05-18 MED ORDER — ZOLPIDEM TARTRATE 10 MG PO TABS: 10.0000 mg | ORAL_TABLET | Freq: Every day | ORAL | 0 refills | Status: DC

## 2019-05-18 MED ORDER — AMPHETAMINE-DEXTROAMPHETAMINE 20 MG PO TABS: ORAL_TABLET | ORAL | 0 refills | Status: DC

## 2019-05-18 MED ORDER — AMPHETAMINE-DEXTROAMPHET ER 30 MG PO CP24: 30.0000 mg | ORAL_CAPSULE | ORAL | 0 refills | Status: DC

## 2019-05-18 NOTE — Progress Notes (Signed)
Virtual Visit via Video Note  I connected with Leslie [name] on 05/18/19 at  3:00 PM EDT by a video enabled telemedicine application and verified that I am speaking with the correct person using two identifiers.  Location patient: home Location provider: work office Persons participating in the virtual visit: patient, provider  I discussed the limitations of evaluation and management by telemedicine and the availability of in person appointments. The patient expressed understanding and agreed to proceed.   HPI: She has scheduled this visit for medication refills. Due for ambien refills. Has 3 weeks of Adderall remaining. She has no acute complaints today. She would like to avoid in-person visits for now as her partner just finished chemotherapy for ovarian cancer.   ROS: Constitutional: Denies fever, chills, diaphoresis, appetite change and fatigue.  HEENT: Denies photophobia, eye pain, redness, hearing loss, ear pain, congestion, sore throat, rhinorrhea, sneezing, mouth sores, trouble swallowing, neck pain, neck stiffness and tinnitus.   Respiratory: Denies SOB, DOE, cough, chest tightness,  and wheezing.   Cardiovascular: Denies chest pain, palpitations and leg swelling.  Gastrointestinal: Denies nausea, vomiting, abdominal pain, diarrhea, constipation, blood in stool and abdominal distention.  Genitourinary: Denies dysuria, urgency, frequency, hematuria, flank pain and difficulty urinating.  Endocrine: Denies: hot or cold intolerance, sweats, changes in hair or nails, polyuria, polydipsia. Musculoskeletal: Denies myalgias, back pain, joint swelling, arthralgias and gait problem.  Skin: Denies pallor, rash and wound.  Neurological: Denies dizziness, seizures, syncope, weakness, light-headedness, numbness and headaches.  Hematological: Denies adenopathy. Easy bruising, personal or family bleeding history  Psychiatric/Behavioral: Denies suicidal ideation, mood changes, confusion,  nervousness, sleep disturbance and agitation   Past Medical History:  Diagnosis Date  . ADD (attention deficit disorder)   . Allergy   . Arthritis 2016   osteoarthritis left knee  . Colon polyps   . Hypertension   . Left knee pain    chronic  . Narcotic addiction ([facility])   . Obesity    post gastric bypass  . Vitamin D deficiency     Past Surgical History:  Procedure Laterality Date  . arthscopic knee surgery Left    several times  . BREAST BIOPSY    . BREAST SURGERY  years ago   breast biopsy  . CHOLECYSTECTOMY    . EYE SURGERY Bilateral 2013   Toric Lens implants  . EYE SURGERY Right 2014   corneal amniotic membrane  . GASTRIC BYPASS  2003  . KIDNEY DONATION  2004  . TOTAL KNEE ARTHROPLASTY Left 06/16/2015   Procedure: LEFT TOTAL KNEE ARTHROPLASTY;  Surgeon: Kathryne Hitch, MD;  Location: [facility];  Service: Orthopedics;  Laterality: Left;    Family History  Problem Relation Age of Onset  . Heart disease Mother        post CABG history of CHF  . COPD Father   . Diabetes Sister   . Hypertension Sister        post renal transplant  . Heart disease Brother        CAD    SOCIAL HX:   reports that she quit smoking about 26 years ago. She has never used smokeless tobacco. She reports current alcohol use. She reports that she does not use drugs.   Current Outpatient Medications:  .  acetaminophen-codeine (TYLENOL #3) 300-30 MG tablet, Take 1-2 tablets by mouth every 8 (eight) hours as needed for moderate pain., Disp: 30 tablet, Rfl: 0 .  amphetamine-dextroamphetamine (ADDERALL XR) 30 MG 24 hr capsule,  Take 1 capsule (30 mg total) by mouth every morning., Disp: 30 capsule, Rfl: 0 .  amphetamine-dextroamphetamine (ADDERALL XR) 30 MG 24 hr capsule, Take 1 capsule (30 mg total) by mouth every morning. Fill in one month, Disp: 30 capsule, Rfl: 0 .  amphetamine-dextroamphetamine (ADDERALL XR) 30 MG 24 hr capsule, Take 1 capsule (30 mg total) by mouth every morning.,  Disp: 30 capsule, Rfl: 0 .  amphetamine-dextroamphetamine (ADDERALL) 20 MG tablet, Take 1/2 to 1 tablet by mouth every evening if needed., Disp: 30 tablet, Rfl: 0 .  amphetamine-dextroamphetamine (ADDERALL) 20 MG tablet, Take 1/2 to 1 tablet by mouth every evening if needed., Disp: 30 tablet, Rfl: 0 .  amphetamine-dextroamphetamine (ADDERALL) 20 MG tablet, take 1/2 to 1 tablet by mouth every evening if needed, Disp: 30 tablet, Rfl: 0 .  Ascorbic Acid (VITAMIN C) 1000 MG tablet, Take 1,000 mg by mouth daily. , Disp: , Rfl:  .  calcium-vitamin D (SM CALCIUM 500/VITAMIN D3) 500-400 MG-UNIT tablet, Take 1 tablet by mouth daily. , Disp: , Rfl:  .  FLUoxetine (PROZAC) 40 MG capsule, TAKE 1 CAPSULE(40 MG) BY MOUTH EVERY MORNING, Disp: 90 capsule, Rfl: 0 .  hydrochlorothiazide (HYDRODIURIL) 25 MG tablet, Take 1 tablet (25 mg total) by mouth daily., Disp: 90 tablet, Rfl: 1 .  lamoTRIgine (LAMICTAL) 100 MG tablet, TAKE 1 TABLET(100 MG) BY MOUTH TWICE DAILY, Disp: 180 tablet, Rfl: 1 .  traMADol (ULTRAM) 50 MG tablet, 1-2 tablets every 8 hours as needed for pain, Disp: 30 tablet, Rfl: 0 .  triamcinolone (NASACORT) 55 MCG/ACT nasal inhaler, Place 2 sprays into the nose daily. (Patient taking differently: Place 2 sprays into the nose daily as needed (allergies). ), Disp: 1 Inhaler, Rfl: 6 .  zolpidem (AMBIEN) 10 MG tablet, Take 1 tablet (10 mg total) by mouth at bedtime., Disp: 90 tablet, Rfl: 0  EXAM:   VITALS per patient if applicable: none reported  GENERAL: alert, oriented, appears well and in no acute distress  HEENT: atraumatic, conjunttiva clear, no obvious abnormalities on inspection of external nose and ears  NECK: normal movements of the head and neck  LUNGS: on inspection no signs of respiratory distress, breathing rate appears normal, no obvious gross increased work of breathing, gasping or wheezing  CV: no obvious cyanosis  MS: moves all visible extremities without noticeable abnormality   PSYCH/NEURO: pleasant and cooperative, no obvious depression or anxiety, speech and thought processing grossly intact  ASSESSMENT AND PLAN:   Attention deficit hyperactivity disorder (ADHD), predominantly inattentive type -PDMP reviewed, no red flags, ORS 360. -Refill Adderall x 3 months.  Sleep disorder  -Refill Ambien x 3 months.     I discussed the assessment and treatment plan with the patient. The patient was provided an opportunity to ask questions and all were answered. The patient agreed with the plan and demonstrated an understanding of the instructions.   The patient was advised to call back or seek an in-person evaluation if the symptoms worsen or if the condition fails to improve as anticipated.    Lelon Frohlich, MD  [facility] at [facility]

## 2019-05-20 ENCOUNTER — Encounter (HOSPITAL_COMMUNITY): Payer: Self-pay | Admitting: Emergency Medicine

## 2019-05-20 ENCOUNTER — Ambulatory Visit (INDEPENDENT_AMBULATORY_CARE_PROVIDER_SITE_OTHER)
Admission: EM | Admit: 2019-05-20 | Discharge: 2019-05-20 | Disposition: A | Discharge status: Home / Self Care | Payer: Medicare Other | Source: Home / Self Care

## 2019-05-20 ENCOUNTER — Other Ambulatory Visit: Payer: Self-pay

## 2019-05-20 ENCOUNTER — Emergency Department (HOSPITAL_COMMUNITY)
Admission: EM | Admit: 2019-05-20 | Discharge: 2019-05-20 | Disposition: A | Discharge status: Home / Self Care | Payer: Medicare Other | Attending: Emergency Medicine | Admitting: Emergency Medicine

## 2019-05-20 ENCOUNTER — Encounter: Payer: Self-pay | Admitting: *Deleted

## 2019-05-20 DIAGNOSIS — Z5321 Procedure and treatment not carried out due to patient leaving prior to being seen by health care provider: Secondary | ICD-10-CM | POA: Insufficient documentation

## 2019-05-20 DIAGNOSIS — R519 Headache, unspecified: Secondary | ICD-10-CM | POA: Insufficient documentation

## 2019-05-20 DIAGNOSIS — W19XXXA Unspecified fall, initial encounter: Secondary | ICD-10-CM

## 2019-05-20 DIAGNOSIS — S0101XA Laceration without foreign body of scalp, initial encounter: Secondary | ICD-10-CM | POA: Diagnosis not present

## 2019-05-20 NOTE — ED Provider Notes (Signed)
[place]    CSN: [id number] Arrival date & time: 05/20/19  1710      History   Chief Complaint Chief Complaint  Patient presents with  . Fall  . Appointment    5:10    HPI Leslie [name] is a 68 y.o. female.   Patient presents with a small laceration on the back of her head.  She states she slipped in the mud today and fell approximately 4 hours PTA.  She denies loss of consciousness.  She denies confusion, headache, dizziness, vision changes, weakness, nausea, or other symptoms.  She states she went to [facility] emergency department but left because the wait was too long.  Patient does not take aspirin or blood thinners.  She treated the wound at home with pressure to stop the bleeding.     The history is provided by the patient.    Past Medical History:  Diagnosis Date  . ADD (attention deficit disorder)   . Allergy   . Arthritis 2016   osteoarthritis left knee  . Colon polyps   . Hypertension   . Left knee pain    chronic  . Narcotic addiction ([place])   . Obesity    post gastric bypass  . Vitamin D deficiency     Patient Active Problem List   Diagnosis Date Noted  . Sleep disorder 07/10/2018  . ADHD (attention deficit hyperactivity disorder) 10/01/2017  . Unilateral primary osteoarthritis, right knee 08/20/2017  . Chronic pain of both shoulders 08/20/2017  . Complete tear of right rotator cuff 04/29/2017  . Osteoarthritis of left knee 06/16/2015  . Status post total left knee replacement 06/16/2015  . MYALGIA 12/19/2009  . SUBSTANCE ABUSE 02/06/2009  . KNEE PAIN, LEFT 10/06/2008  . Attention deficit disorder 09/14/2007  . COLONIC POLYPS, HX OF 08/03/2007  . Essential hypertension 01/20/2007  . OBESITY NOS 12/04/2006  . Allergic rhinitis 12/04/2006  . GASTROJEJUNOSTOMY, HX OF 12/04/2006  . BREAST BIOPSY, HX OF 12/04/2006    Past Surgical History:  Procedure Laterality Date  . arthscopic knee surgery Left    several times  . BREAST  BIOPSY    . BREAST SURGERY  years ago   breast biopsy  . CHOLECYSTECTOMY    . EYE SURGERY Bilateral 2013   Toric Lens implants  . EYE SURGERY Right 2014   corneal amniotic membrane  . GASTRIC BYPASS  2003  . KIDNEY DONATION  2004  . TOTAL KNEE ARTHROPLASTY Left 06/16/2015   Procedure: LEFT TOTAL KNEE ARTHROPLASTY;  Surgeon: Mcarthur Rossetti, MD;  Location: [facility];  Service: Orthopedics;  Laterality: Left;    OB History   No obstetric history on file.      Home Medications    Prior to Admission medications   Medication Sig Start Date End Date Taking? Authorizing Provider  amphetamine-dextroamphetamine (ADDERALL XR) 30 MG 24 hr capsule Take 1 capsule (30 mg total) by mouth every morning. 05/18/19  Yes Isaac Bliss, Rayford Halsted, MD  FLUoxetine (PROZAC) 40 MG capsule TAKE 1 CAPSULE(40 MG) BY MOUTH EVERY MORNING 05/13/19  Yes Isaac Bliss, Rayford Halsted, MD  hydrochlorothiazide (HYDRODIURIL) 25 MG tablet Take 1 tablet (25 mg total) by mouth daily. 04/09/19  Yes Isaac Bliss, Rayford Halsted, MD  lamoTRIgine (LAMICTAL) 100 MG tablet TAKE 1 TABLET(100 MG) BY MOUTH TWICE DAILY 05/04/19  Yes Isaac Bliss, Rayford Halsted, MD  zolpidem (AMBIEN) 10 MG tablet Take 1 tablet (10 mg total) by mouth at bedtime. 05/18/19  Yes  Philip Aspen, Limmie Patricia, MD  acetaminophen-codeine (TYLENOL #3) 300-30 MG tablet Take 1-2 tablets by mouth every 8 (eight) hours as needed for moderate pain. 10/02/18   Kathryne Hitch, MD  amphetamine-dextroamphetamine (ADDERALL XR) 30 MG 24 hr capsule Take 1 capsule (30 mg total) by mouth every morning. 05/18/19   Henderson Cloud, MD  amphetamine-dextroamphetamine (ADDERALL XR) 30 MG 24 hr capsule Take 1 capsule (30 mg total) by mouth every morning. Fill in one month 05/18/19   Philip Aspen, Limmie Patricia, MD  amphetamine-dextroamphetamine (ADDERALL) 20 MG tablet Take 1/2 to 1 tablet by mouth every evening if needed. 05/18/19   Henderson Cloud, MD   amphetamine-dextroamphetamine (ADDERALL) 20 MG tablet Take 1/2 to 1 tablet by mouth every evening if needed. 05/18/19   Philip Aspen, Limmie Patricia, MD  amphetamine-dextroamphetamine (ADDERALL) 20 MG tablet take 1/2 to 1 tablet by mouth every evening if needed 05/18/19   Philip Aspen, Limmie Patricia, MD  Ascorbic Acid (VITAMIN C) 1000 MG tablet Take 1,000 mg by mouth daily.     [provider]  calcium-vitamin D (SM CALCIUM 500/VITAMIN D3) 500-400 MG-UNIT tablet Take 1 tablet by mouth daily.     [provider]  traMADol Janean Sark) 50 MG tablet 1-2 tablets every 8 hours as needed for pain 05/06/19   Kathryne Hitch, MD  triamcinolone (NASACORT) 55 MCG/ACT nasal inhaler Place 2 sprays into the nose daily. Patient taking differently: Place 2 sprays into the nose daily as needed (allergies).  06/17/11   Gordy Savers, MD    Family History Family History  Problem Relation Age of Onset  . Heart disease Mother        post CABG history of CHF  . COPD Father   . Diabetes Sister   . Hypertension Sister        post renal transplant  . Heart disease Brother        CAD    Social History Social History   Tobacco Use  . Smoking status: Former Smoker    Quit date: 07/22/1992    Years since quitting: 26.8  . Smokeless tobacco: Never Used  Substance Use Topics  . Alcohol use: Yes    Comment: once every 6 months  . Drug use: No     Allergies   Sulfamethoxazole   Review of Systems Review of Systems  Constitutional: Negative for chills and fever.  HENT: Negative for ear pain and sore throat.   Eyes: Negative for pain and visual disturbance.  Respiratory: Negative for cough.   Cardiovascular: Negative for palpitations.  Gastrointestinal: Negative for vomiting.  Genitourinary: Negative for dysuria and hematuria.  Musculoskeletal: Negative for arthralgias and back pain.  Skin: Positive for wound. Negative for color change and rash.  Neurological: Negative for  dizziness, tremors, seizures, syncope, facial asymmetry, speech difficulty, weakness, light-headedness, numbness and headaches.  All other systems reviewed and are negative.    Physical Exam Triage Vital Signs ED Triage Vitals  Enc Vitals Group     BP 05/20/19 1734 (!) 146/62     Pulse Rate 05/20/19 1734 81     Resp 05/20/19 1734 18     Temp 05/20/19 1734 98.2 F (36.8 C)     Temp Source 05/20/19 1734 Temporal     SpO2 05/20/19 1734 100 %     Weight --      Height --      Head Circumference --      Peak Flow --  Pain Score 05/20/19 1737 0     Pain Loc --      Pain Edu? --      Excl. in GC? --    No data found.  Updated Vital Signs BP (!) 146/62 (BP Location: Right Arm)   Pulse 81   Temp 98.2 F (36.8 C) (Temporal)   Resp 18   SpO2 100%   Visual Acuity Right Eye Distance:   Left Eye Distance:   Bilateral Distance:    Right Eye Near:   Left Eye Near:    Bilateral Near:     Physical Exam Vitals signs and nursing note reviewed.  Constitutional:      General: She is not in acute distress.    Appearance: She is well-developed.  HENT:     Head: Normocephalic and atraumatic.     Mouth/Throat:     Mouth: Mucous membranes are moist.     Pharynx: Oropharynx is clear.  Eyes:     Extraocular Movements: Extraocular movements intact.     Conjunctiva/sclera: Conjunctivae normal.     Pupils: Pupils are equal, round, and reactive to light.  Neck:     Musculoskeletal: Neck supple.  Cardiovascular:     Rate and Rhythm: Normal rate and regular rhythm.     Heart sounds: No murmur.  Pulmonary:     Effort: Pulmonary effort is normal. No respiratory distress.     Breath sounds: Normal breath sounds.  Abdominal:     General: Bowel sounds are normal.     Palpations: Abdomen is soft.     Tenderness: There is no abdominal tenderness. There is no guarding or rebound.  Musculoskeletal: Normal range of motion.        General: No swelling, tenderness or deformity.  Skin:     General: Skin is warm and dry.     Capillary Refill: Capillary refill takes less than 2 seconds.     Findings: Lesion present.     Comments: 1 cm laceration. No active bleeding.   Neurological:     Mental Status: She is alert and oriented to person, place, and time. Mental status is at baseline.     Cranial Nerves: No cranial nerve deficit.     Sensory: No sensory deficit.     Motor: No weakness.     Coordination: Coordination normal.     Gait: Gait normal.     Deep Tendon Reflexes: Reflexes normal.  Psychiatric:        Mood and Affect: Mood normal.        Behavior: Behavior normal.      UC Treatments / Results  Labs (all labs ordered are listed, but only abnormal results are displayed) Labs Reviewed - No data to display  EKG   Radiology No results found.  Procedures Laceration Repair  Date/Time: 05/20/2019 6:13 PM Performed by: Mickie Bail, NP Authorized by: Mickie Bail, NP   Consent:    Consent obtained:  Verbal   Consent given by:  Patient Anesthesia (see MAR for exact dosages):    Anesthesia method:  Local infiltration   Local anesthetic:  Lidocaine 1% WITH epi Laceration details:    Location:  Scalp   Length (cm):  1   Depth (mm):  2 Exploration:    Hemostasis achieved with:  Direct pressure   Wound exploration: entire depth of wound probed and visualized   Treatment:    Area cleansed with:  Shur-Clens   Irrigation solution:  Sterile saline  Irrigation method:  Syringe   Visualized foreign bodies/material removed: no   Skin repair:    Repair method:  Staples   Number of staples:  1 Approximation:    Approximation:  Close Post-procedure details:    Dressing:  Antibiotic ointment   (including critical care time)  Medications Ordered in UC Medications - No data to display  Initial Impression / Assessment and Plan / UC Course  I have reviewed the triage vital signs and the nursing notes.  Pertinent labs & imaging results that were  available during my care of the patient were reviewed by me and considered in my medical decision making (see chart for details).     Scalp laceration due to a fall.  1 staple.  Patient is well-appearing and denies symptoms at this time.  Discussed with her that her staple needs to be removed in 7 to 10 days.  Wound care instructions and signs of infection discussed.  Strict instructions given to patient to go to the emergency department if she has confusion, headache, dizziness, vision changes, weakness, nausea, or other symptoms.  Patient agrees to plan of care.   Final Clinical Impressions(s) / UC Diagnoses   Final diagnoses:  Laceration of scalp, initial encounter  Fall, initial encounter     Discharge Instructions     Your staple needs to come out in 7 days.  Keep your wound clean and dry.  Wash it gently twice a day with soap and water.  Apply an antibiotic cream twice a day.    Return here if you see signs of infection, such as increased pain, redness, pus-like drainage, warmth, fever, chills, or other concerning symptoms.    Go to the emergency department if you have confusion, headache, dizziness, vision changes, weakness, nausea, or other symptoms.         ED Prescriptions    None     I have reviewed the PDMP during this encounter.   Mickie Bailate, Nicholaos Schippers H, NP 05/20/19 1815

## 2019-05-20 NOTE — ED Triage Notes (Signed)
Patient reports slip and fall in garage today hitting head on wheel of car. Denies LOC, neck, and back pain. Denies taking blood thinners. C/o left head pain.

## 2019-05-20 NOTE — ED Triage Notes (Signed)
Stepped in mud on cement and fell, striking back of head, no loc.  No headache.

## 2019-05-20 NOTE — ED Notes (Signed)
Per registration, patient reports she is leaving. ?

## 2019-05-20 NOTE — Discharge Instructions (Addendum)
Your staple needs to come out in 7 days.  Keep your wound clean and dry.  Wash it gently twice a day with soap and water.  Apply an antibiotic cream twice a day.    Return here if you see signs of infection, such as increased pain, redness, pus-like drainage, warmth, fever, chills, or other concerning symptoms.    Go to the emergency department if you have confusion, headache, dizziness, vision changes, weakness, nausea, or other symptoms.

## 2019-05-20 NOTE — Telephone Encounter (Signed)
Prior Authorization not needed at this time.  Patient picked up #30 with 2 refills from local pharmacy of Zolpidem from [place].  Mail order required prior auth for #90.

## 2019-05-21 ENCOUNTER — Ambulatory Visit: Payer: Medicare Other | Admitting: Internal Medicine

## 2019-05-24 ENCOUNTER — Other Ambulatory Visit: Payer: Self-pay | Admitting: Orthopaedic Surgery

## 2019-05-24 MED ORDER — TRAMADOL HCL 50 MG PO TABS: ORAL_TABLET | ORAL | 0 refills | Status: DC

## 2019-05-24 NOTE — Telephone Encounter (Signed)
Please advise 

## 2019-05-27 ENCOUNTER — Ambulatory Visit (HOSPITAL_COMMUNITY)
Admission: EM | Admit: 2019-05-27 | Discharge: 2019-05-27 | Disposition: A | Discharge status: Home / Self Care | Payer: Medicare Other

## 2019-05-27 ENCOUNTER — Other Ambulatory Visit: Payer: Self-pay

## 2019-05-27 DIAGNOSIS — Z4802 Encounter for removal of sutures: Secondary | ICD-10-CM

## 2019-05-27 NOTE — ED Triage Notes (Signed)
Pt here for removal of one staple to the back of her head.  Pt denies pain or any other issues.  The wound was clean and dry and approximated.  The staple was removed and the pt tolerated it well.

## 2019-07-20 ENCOUNTER — Other Ambulatory Visit: Payer: Self-pay | Admitting: Orthopaedic Surgery

## 2019-07-20 MED ORDER — TRAMADOL HCL 50 MG PO TABS: ORAL_TABLET | ORAL | 0 refills | Status: DC

## 2019-07-20 NOTE — Telephone Encounter (Signed)
Please advise 

## 2019-07-22 ENCOUNTER — Other Ambulatory Visit: Payer: Self-pay

## 2019-07-22 ENCOUNTER — Ambulatory Visit (HOSPITAL_COMMUNITY)
Admission: EM | Admit: 2019-07-22 | Discharge: 2019-07-22 | Disposition: A | Discharge status: Home / Self Care | Payer: Medicare Other | Attending: Internal Medicine | Admitting: Internal Medicine

## 2019-07-22 DIAGNOSIS — M7918 Myalgia, other site: Secondary | ICD-10-CM | POA: Diagnosis not present

## 2019-07-22 NOTE — ED Provider Notes (Signed)
[place]    CSN: [id number] Arrival date & time: 07/22/19  1451      History   Chief Complaint Back pain for few days duration  HPI Leslie [name] is a 68 y.o. female with history of hypertension comes to urgent care with complaints of back pain after few days duration.  Patient sustained a mechanical fall a few days ago.  She started having some moderate severity back pain which has improved over the past couple of days.  Pain was initially severe, sharp and throbbing.  Aggravated by movement.  She did not hit her head.  Currently pain is minimal.   HPI  Past Medical History:  Diagnosis Date  . ADD (attention deficit disorder)   . Allergy   . Arthritis 2016   osteoarthritis left knee  . Colon polyps   . Hypertension   . Left knee pain    chronic  . Narcotic addiction ([place])   . Obesity    post gastric bypass  . Vitamin D deficiency     Patient Active Problem List   Diagnosis Date Noted  . Sleep disorder 07/10/2018  . ADHD (attention deficit hyperactivity disorder) 10/01/2017  . Unilateral primary osteoarthritis, right knee 08/20/2017  . Chronic pain of both shoulders 08/20/2017  . Complete tear of right rotator cuff 04/29/2017  . Osteoarthritis of left knee 06/16/2015  . Status post total left knee replacement 06/16/2015  . MYALGIA 12/19/2009  . SUBSTANCE ABUSE 02/06/2009  . KNEE PAIN, LEFT 10/06/2008  . Attention deficit disorder 09/14/2007  . COLONIC POLYPS, HX OF 08/03/2007  . Essential hypertension 01/20/2007  . OBESITY NOS 12/04/2006  . Allergic rhinitis 12/04/2006  . GASTROJEJUNOSTOMY, HX OF 12/04/2006  . BREAST BIOPSY, HX OF 12/04/2006    Past Surgical History:  Procedure Laterality Date  . arthscopic knee surgery Left    several times  . BREAST BIOPSY    . BREAST SURGERY  years ago   breast biopsy  . CHOLECYSTECTOMY    . EYE SURGERY Bilateral 2013   Toric Lens implants  . EYE SURGERY Right 2014   corneal amniotic membrane  .  GASTRIC BYPASS  2003  . KIDNEY DONATION  2004  . TOTAL KNEE ARTHROPLASTY Left 06/16/2015   Procedure: LEFT TOTAL KNEE ARTHROPLASTY;  Surgeon: Mcarthur Rossetti, MD;  Location: [facility];  Service: Orthopedics;  Laterality: Left;    OB History   No obstetric history on file.      Home Medications    Prior to Admission medications   Medication Sig Start Date End Date Taking? Authorizing Provider  acetaminophen-codeine (TYLENOL #3) 300-30 MG tablet Take 1-2 tablets by mouth every 8 (eight) hours as needed for moderate pain. 10/02/18   Mcarthur Rossetti, MD  amphetamine-dextroamphetamine (ADDERALL XR) 30 MG 24 hr capsule Take 1 capsule (30 mg total) by mouth every morning. 05/18/19   Erline Hau, MD  amphetamine-dextroamphetamine (ADDERALL XR) 30 MG 24 hr capsule Take 1 capsule (30 mg total) by mouth every morning. Fill in one month 05/18/19   Isaac Bliss, Rayford Halsted, MD  amphetamine-dextroamphetamine (ADDERALL XR) 30 MG 24 hr capsule Take 1 capsule (30 mg total) by mouth every morning. 05/18/19   Erline Hau, MD  amphetamine-dextroamphetamine (ADDERALL) 20 MG tablet Take 1/2 to 1 tablet by mouth every evening if needed. 05/18/19   Erline Hau, MD  amphetamine-dextroamphetamine (ADDERALL) 20 MG tablet Take 1/2 to 1 tablet by mouth every evening if  needed. 05/18/19    AspenHernandez Acosta, Limmie PatriciaEstela Y, MD  amphetamine-dextroamphetamine (ADDERALL) 20 MG tablet take 1/2 to 1 tablet by mouth every evening if needed 05/18/19    AspenHernandez Acosta, Limmie PatriciaEstela Y, MD  Ascorbic Acid (VITAMIN C) 1000 MG tablet Take 1,000 mg by mouth daily.     [provider]  calcium-vitamin D (SM CALCIUM 500/VITAMIN D3) 500-400 MG-UNIT tablet Take 1 tablet by mouth daily.     [provider]  FLUoxetine (PROZAC) 40 MG capsule TAKE 1 CAPSULE(40 MG) BY MOUTH EVERY MORNING 05/13/19    AspenHernandez Acosta, Limmie PatriciaEstela Y, MD  hydrochlorothiazide (HYDRODIURIL) 25 MG tablet  Take 1 tablet (25 mg total) by mouth daily. 04/09/19    AspenHernandez Acosta, Limmie PatriciaEstela Y, MD  lamoTRIgine (LAMICTAL) 100 MG tablet TAKE 1 TABLET(100 MG) BY MOUTH TWICE DAILY 05/04/19    AspenHernandez Acosta, Limmie PatriciaEstela Y, MD  traMADol Janean Sark(ULTRAM) 50 MG tablet 1-2 tablets every 8 hours as needed for pain 07/20/19   Kathryne HitchBlackman, Christopher Y, MD  triamcinolone (NASACORT) 55 MCG/ACT nasal inhaler Place 2 sprays into the nose daily. Patient taking differently: Place 2 sprays into the nose daily as needed (allergies).  06/17/11   Gordy SaversKwiatkowski, Peter F, MD  zolpidem (AMBIEN) 10 MG tablet Take 1 tablet (10 mg total) by mouth at bedtime. 05/18/19   Henderson CloudHernandez Acosta, Estela Y, MD    Family History Family History  Problem Relation Age of Onset  . Heart disease Mother        post CABG history of CHF  . COPD Father   . Diabetes Sister   . Hypertension Sister        post renal transplant  . Heart disease Brother        CAD    Social History Social History   Tobacco Use  . Smoking status: Former Smoker    Quit date: 07/22/1992    Years since quitting: 27.0  . Smokeless tobacco: Never Used  Substance Use Topics  . Alcohol use: Yes    Comment: once every 6 months  . Drug use: No     Allergies   Sulfamethoxazole   Review of Systems Review of Systems  Respiratory: Negative for cough, choking, chest tightness and shortness of breath.   Gastrointestinal: Negative for abdominal distention, diarrhea, nausea and vomiting.  Musculoskeletal: Negative for arthralgias, joint swelling and myalgias.  Skin: Negative.   Neurological: Negative for dizziness, light-headedness and headaches.  Psychiatric/Behavioral: Negative for confusion and decreased concentration.     Physical Exam Triage Vital Signs ED Triage Vitals  Enc Vitals Group     BP      Pulse      Resp      Temp      Temp src      SpO2      Weight      Height      Head Circumference      Peak Flow      Pain Score      Pain Loc      Pain Edu?       Excl. in GC?    No data found.  Updated Vital Signs There were no vitals taken for this visit.  Visual Acuity Right Eye Distance:   Left Eye Distance:   Bilateral Distance:    Right Eye Near:   Left Eye Near:    Bilateral Near:     Physical Exam Vitals and nursing note reviewed.  Constitutional:      General: She is not in  acute distress.    Appearance: She is not ill-appearing.  Cardiovascular:     Rate and Rhythm: Normal rate and regular rhythm.     Pulses: Normal pulses.     Heart sounds: Normal heart sounds.  Pulmonary:     Effort: Pulmonary effort is normal.     Breath sounds: Normal breath sounds.  Abdominal:     General: Bowel sounds are normal.     Palpations: Abdomen is soft.  Musculoskeletal:        General: No swelling or signs of injury. Normal range of motion.     Right lower leg: No edema.     Left lower leg: No edema.  Skin:    General: Skin is warm.     Capillary Refill: Capillary refill takes less than 2 seconds.     Findings: No bruising or erythema.  Neurological:     General: No focal deficit present.     Mental Status: She is alert and oriented to person, place, and time.      UC Treatments / Results  Labs (all labs ordered are listed, but only abnormal results are displayed) Labs Reviewed - No data to display  EKG   Radiology No results found.  Procedures Procedures (including critical care time)  Medications Ordered in UC Medications - No data to display  Initial Impression / Assessment and Plan / UC Course  I have reviewed the triage vital signs and the nursing notes.  Pertinent labs & imaging results that were available during my care of the patient were reviewed by me and considered in my medical decision making (see chart for details).     1.  Musculoskeletal pain following a fall: Currently no residual pain Patient has full range of motion with no reproducible pain. No further work-up needed. Over-the-counter  Tylenol or Motrin as needed. Final Clinical Impressions(s) / UC Diagnoses   Final diagnoses:  Musculoskeletal pain   Discharge Instructions   None    ED Prescriptions    None     PDMP not reviewed this encounter.   Merrilee Jansky, MD 07/26/19 [phone]

## 2019-07-22 NOTE — ED Triage Notes (Signed)
Seen  by provider only

## 2019-07-26 ENCOUNTER — Other Ambulatory Visit: Payer: Self-pay | Admitting: Internal Medicine

## 2019-07-26 DIAGNOSIS — F900 Attention-deficit hyperactivity disorder, predominantly inattentive type: Secondary | ICD-10-CM

## 2019-07-26 DIAGNOSIS — G479 Sleep disorder, unspecified: Secondary | ICD-10-CM

## 2019-07-27 ENCOUNTER — Encounter: Payer: Self-pay | Admitting: Internal Medicine

## 2019-07-27 NOTE — Telephone Encounter (Signed)
Patient need to schedule an ov for more refills. 

## 2019-07-28 ENCOUNTER — Encounter: Payer: Self-pay | Admitting: Internal Medicine

## 2019-07-28 DIAGNOSIS — F900 Attention-deficit hyperactivity disorder, predominantly inattentive type: Secondary | ICD-10-CM

## 2019-07-28 MED ORDER — AMPHETAMINE-DEXTROAMPHETAMINE 20 MG PO TABS: ORAL_TABLET | ORAL | 0 refills | Status: DC

## 2019-07-28 NOTE — Telephone Encounter (Signed)
Spoke with pharmacist and there is 1 refill left of the Adderall XR 30 and will be filled today.  She states that only 2 Rx for Adderall 20 were received.  Please send in Adderall 20 for patient.

## 2019-07-29 NOTE — Telephone Encounter (Signed)
Can you please deny the Adderall?  This has already been addressed.

## 2019-07-30 ENCOUNTER — Other Ambulatory Visit: Payer: Self-pay | Admitting: Internal Medicine

## 2019-08-11 ENCOUNTER — Other Ambulatory Visit: Payer: Self-pay | Admitting: Internal Medicine

## 2019-08-11 DIAGNOSIS — G479 Sleep disorder, unspecified: Secondary | ICD-10-CM

## 2019-08-12 ENCOUNTER — Other Ambulatory Visit: Payer: Self-pay | Admitting: Internal Medicine

## 2019-08-12 ENCOUNTER — Encounter: Payer: Self-pay | Admitting: Internal Medicine

## 2019-08-12 DIAGNOSIS — G479 Sleep disorder, unspecified: Secondary | ICD-10-CM

## 2019-08-12 MED ORDER — ZOLPIDEM TARTRATE 10 MG PO TABS: 10.0000 mg | ORAL_TABLET | Freq: Every day | ORAL | 0 refills | Status: DC

## 2019-08-13 ENCOUNTER — Other Ambulatory Visit: Payer: Self-pay

## 2019-08-13 ENCOUNTER — Telehealth (INDEPENDENT_AMBULATORY_CARE_PROVIDER_SITE_OTHER): Payer: Medicare PPO | Admitting: Internal Medicine

## 2019-08-13 DIAGNOSIS — F339 Major depressive disorder, recurrent, unspecified: Secondary | ICD-10-CM | POA: Diagnosis not present

## 2019-08-13 DIAGNOSIS — I10 Essential (primary) hypertension: Secondary | ICD-10-CM | POA: Diagnosis not present

## 2019-08-13 DIAGNOSIS — F900 Attention-deficit hyperactivity disorder, predominantly inattentive type: Secondary | ICD-10-CM | POA: Diagnosis not present

## 2019-08-13 DIAGNOSIS — J301 Allergic rhinitis due to pollen: Secondary | ICD-10-CM | POA: Diagnosis not present

## 2019-08-13 MED ORDER — AMPHETAMINE-DEXTROAMPHETAMINE 20 MG PO TABS: ORAL_TABLET | ORAL | 0 refills | Status: DC

## 2019-08-13 MED ORDER — AMPHETAMINE-DEXTROAMPHET ER 30 MG PO CP24: 30.0000 mg | ORAL_CAPSULE | ORAL | 0 refills | Status: DC

## 2019-08-13 MED ORDER — FLUOXETINE HCL 40 MG PO CAPS: 40.0000 mg | ORAL_CAPSULE | Freq: Every day | ORAL | 1 refills | Status: DC

## 2019-08-13 MED ORDER — HYDROCHLOROTHIAZIDE 25 MG PO TABS: 25.0000 mg | ORAL_TABLET | Freq: Every day | ORAL | 1 refills | Status: DC

## 2019-08-13 MED ORDER — TRIAMCINOLONE ACETONIDE 55 MCG/ACT NA AERO: 2.0000 | INHALATION_SPRAY | Freq: Every day | NASAL | 3 refills | Status: DC

## 2019-08-13 NOTE — Progress Notes (Signed)
Virtual Visit via Video Note  I connected with Leslie [name] on 08/13/19 at  2:15 PM EST by a video enabled telemedicine application and verified that I am speaking with the correct person using two identifiers.  Location patient: home Location provider: work office Persons participating in the virtual visit: patient, provider  I discussed the limitations of evaluation and management by telemedicine and the availability of in person appointments. The patient expressed understanding and agreed to proceed.   HPI: She has scheduled this visit for medication refills.  She is due for her Adderall.  She is switching pharmacies now and would like all of her prescription sent to the new one.  She has no acute complaints.   ROS: Constitutional: Denies fever, chills, diaphoresis, appetite change and fatigue.  HEENT: Denies photophobia, eye pain, redness, hearing loss, ear pain, congestion, sore throat, rhinorrhea, sneezing, mouth sores, trouble swallowing, neck pain, neck stiffness and tinnitus.   Respiratory: Denies SOB, DOE, cough, chest tightness,  and wheezing.   Cardiovascular: Denies chest pain, palpitations and leg swelling.  Gastrointestinal: Denies nausea, vomiting, abdominal pain, diarrhea, constipation, blood in stool and abdominal distention.  Genitourinary: Denies dysuria, urgency, frequency, hematuria, flank pain and difficulty urinating.  Endocrine: Denies: hot or cold intolerance, sweats, changes in hair or nails, polyuria, polydipsia. Musculoskeletal: Denies myalgias, back pain, joint swelling, arthralgias and gait problem.  Skin: Denies pallor, rash and wound.  Neurological: Denies dizziness, seizures, syncope, weakness, light-headedness, numbness and headaches.  Hematological: Denies adenopathy. Easy bruising, personal or family bleeding history  Psychiatric/Behavioral: Denies suicidal ideation, mood changes, confusion, nervousness, sleep disturbance and  agitation   Past Medical History:  Diagnosis Date  . ADD (attention deficit disorder)   . Allergy   . Arthritis 2016   osteoarthritis left knee  . Colon polyps   . Hypertension   . Left knee pain    chronic  . Narcotic addiction ([place])   . Obesity    post gastric bypass  . Vitamin D deficiency     Past Surgical History:  Procedure Laterality Date  . arthscopic knee surgery Left    several times  . BREAST BIOPSY    . BREAST SURGERY  years ago   breast biopsy  . CHOLECYSTECTOMY    . EYE SURGERY Bilateral 2013   Toric Lens implants  . EYE SURGERY Right 2014   corneal amniotic membrane  . GASTRIC BYPASS  2003  . KIDNEY DONATION  2004  . TOTAL KNEE ARTHROPLASTY Left 06/16/2015   Procedure: LEFT TOTAL KNEE ARTHROPLASTY;  Surgeon: Mcarthur Rossetti, MD;  Location: [facility];  Service: Orthopedics;  Laterality: Left;    Family History  Problem Relation Age of Onset  . Heart disease Mother        post CABG history of CHF  . COPD Father   . Diabetes Sister   . Hypertension Sister        post renal transplant  . Heart disease Brother        CAD    SOCIAL HX:   reports that she quit smoking about 27 years ago. She has never used smokeless tobacco. She reports current alcohol use. She reports that she does not use drugs.   Current Outpatient Medications:  .  acetaminophen-codeine (TYLENOL #3) 300-30 MG tablet, Take 1-2 tablets by mouth every 8 (eight) hours as needed for moderate pain., Disp: 30 tablet, Rfl: 0 .  amphetamine-dextroamphetamine (ADDERALL XR) 30 MG 24 hr capsule, Take 1 capsule (  30 mg total) by mouth every morning., Disp: 30 capsule, Rfl: 0 .  amphetamine-dextroamphetamine (ADDERALL XR) 30 MG 24 hr capsule, Take 1 capsule (30 mg total) by mouth every morning. FILL IN ONE MONTH, Disp: 30 capsule, Rfl: 0 .  amphetamine-dextroamphetamine (ADDERALL XR) 30 MG 24 hr capsule, Take 1 capsule (30 mg total) by mouth every morning., Disp: 30 capsule, Rfl: 0 .   amphetamine-dextroamphetamine (ADDERALL) 20 MG tablet, Take 1/2 to 1 tablet by mouth every evening if needed., Disp: 30 tablet, Rfl: 0 .  amphetamine-dextroamphetamine (ADDERALL) 20 MG tablet, take 1/2 to 1 tablet by mouth every evening if needed, Disp: 30 tablet, Rfl: 0 .  amphetamine-dextroamphetamine (ADDERALL) 20 MG tablet, Take 1/2 to 1 tablet by mouth every evening if needed., Disp: 30 tablet, Rfl: 0 .  Ascorbic Acid (VITAMIN C) 1000 MG tablet, Take 1,000 mg by mouth daily. , Disp: , Rfl:  .  calcium-vitamin D (SM CALCIUM 500/VITAMIN D3) 500-400 MG-UNIT tablet, Take 1 tablet by mouth daily. , Disp: , Rfl:  .  FLUoxetine (PROZAC) 40 MG capsule, Take 1 capsule (40 mg total) by mouth daily., Disp: 90 capsule, Rfl: 1 .  hydrochlorothiazide (HYDRODIURIL) 25 MG tablet, Take 1 tablet (25 mg total) by mouth daily., Disp: 90 tablet, Rfl: 1 .  lamoTRIgine (LAMICTAL) 100 MG tablet, TAKE 1 TABLET(100 MG) BY MOUTH TWICE DAILY, Disp: 180 tablet, Rfl: 1 .  traMADol (ULTRAM) 50 MG tablet, 1-2 tablets every 8 hours as needed for pain, Disp: 30 tablet, Rfl: 0 .  triamcinolone (NASACORT) 55 MCG/ACT AERO nasal inhaler, Place 2 sprays into the nose daily., Disp: 1 Inhaler, Rfl: 3 .  zolpidem (AMBIEN) 10 MG tablet, Take 1 tablet (10 mg total) by mouth at bedtime., Disp: 90 tablet, Rfl: 0  EXAM:   VITALS per patient if applicable: None reported  GENERAL: alert, oriented, appears well and in no acute distress  HEENT: atraumatic, conjunttiva clear, no obvious abnormalities on inspection of external nose and ears  NECK: normal movements of the head and neck  LUNGS: on inspection no signs of respiratory distress, breathing rate appears normal, no obvious gross increased work of breathing, gasping or wheezing  CV: no obvious cyanosis  MS: moves all visible extremities without noticeable abnormality  PSYCH/NEURO: pleasant and cooperative, no obvious depression or anxiety, speech and thought processing grossly  intact  ASSESSMENT AND PLAN:   Essential hypertension  -Per patient is well controlled, refill hydrochlorothiazide.  Attention deficit hyperactivity disorder (ADHD), predominantly inattentive type  -PDMP reviewed, refill Adderall x3 months.  Non-seasonal allergic rhinitis due to pollen  - Plan: triamcinolone (NASACORT) 55 MCG/ACT AERO nasal inhaler  Depression, recurrent (HCC)  - Plan: FLUoxetine (PROZAC) 40 MG capsule -Mood is stable.     I discussed the assessment and treatment plan with the patient. The patient was provided an opportunity to ask questions and all were answered. The patient agreed with the plan and demonstrated an understanding of the instructions.   The patient was advised to call back or seek an in-person evaluation if the symptoms worsen or if the condition fails to improve as anticipated.    Chaya Jan, MD  [facility] at [facility]

## 2019-08-25 ENCOUNTER — Other Ambulatory Visit: Payer: Self-pay | Admitting: Orthopaedic Surgery

## 2019-08-25 MED ORDER — TRAMADOL HCL 50 MG PO TABS: ORAL_TABLET | ORAL | 0 refills | Status: DC

## 2019-08-25 NOTE — Telephone Encounter (Signed)
CB patient  

## 2019-08-25 NOTE — Telephone Encounter (Signed)
Please advise 

## 2019-08-30 ENCOUNTER — Other Ambulatory Visit: Payer: Self-pay | Admitting: Internal Medicine

## 2019-08-30 DIAGNOSIS — Z1231 Encounter for screening mammogram for malignant neoplasm of breast: Secondary | ICD-10-CM

## 2019-09-15 ENCOUNTER — Other Ambulatory Visit: Payer: Self-pay | Admitting: Orthopaedic Surgery

## 2019-09-16 MED ORDER — TRAMADOL HCL 50 MG PO TABS: ORAL_TABLET | ORAL | 0 refills | Status: DC

## 2019-09-16 NOTE — Telephone Encounter (Signed)
Please advise 

## 2019-09-16 NOTE — Telephone Encounter (Signed)
CB 

## 2019-10-08 ENCOUNTER — Ambulatory Visit: Payer: Medicare PPO

## 2019-10-11 ENCOUNTER — Ambulatory Visit: Payer: Medicare PPO

## 2019-10-19 ENCOUNTER — Other Ambulatory Visit: Payer: Self-pay | Admitting: Orthopaedic Surgery

## 2019-10-19 MED ORDER — TRAMADOL HCL 50 MG PO TABS: 50.0000 mg | ORAL_TABLET | Freq: Three times a day (TID) | ORAL | 0 refills | Status: DC | PRN

## 2019-10-19 NOTE — Telephone Encounter (Signed)
Ok to refill 

## 2019-10-19 NOTE — Telephone Encounter (Signed)
Can you please advise?

## 2019-10-19 NOTE — Telephone Encounter (Signed)
Can you ask Mardella Layman about this?

## 2019-10-20 MED ORDER — TRAMADOL HCL 50 MG PO TABS: ORAL_TABLET | ORAL | 0 refills | Status: DC

## 2019-10-20 NOTE — Telephone Encounter (Signed)
Called into pharm  

## 2019-10-25 ENCOUNTER — Other Ambulatory Visit: Payer: Self-pay | Admitting: Internal Medicine

## 2019-10-25 ENCOUNTER — Encounter: Payer: Self-pay | Admitting: Internal Medicine

## 2019-10-25 DIAGNOSIS — G479 Sleep disorder, unspecified: Secondary | ICD-10-CM

## 2019-10-26 MED ORDER — ZOLPIDEM TARTRATE 10 MG PO TABS: 10.0000 mg | ORAL_TABLET | Freq: Every day | ORAL | 0 refills | Status: DC

## 2019-10-26 NOTE — Telephone Encounter (Signed)
Can you please deny this.  This Rx has already been filled

## 2019-11-01 ENCOUNTER — Ambulatory Visit
Admission: RE | Admit: 2019-11-01 | Discharge: 2019-11-01 | Disposition: A | Discharge status: Home / Self Care | Payer: Medicare PPO | Source: Ambulatory Visit | Attending: Internal Medicine | Admitting: Internal Medicine

## 2019-11-01 ENCOUNTER — Other Ambulatory Visit: Payer: Self-pay

## 2019-11-01 DIAGNOSIS — Z1231 Encounter for screening mammogram for malignant neoplasm of breast: Secondary | ICD-10-CM | POA: Diagnosis not present

## 2019-11-05 ENCOUNTER — Other Ambulatory Visit: Payer: Self-pay | Admitting: Internal Medicine

## 2019-11-05 ENCOUNTER — Encounter: Payer: Self-pay | Admitting: Internal Medicine

## 2019-11-05 DIAGNOSIS — F900 Attention-deficit hyperactivity disorder, predominantly inattentive type: Secondary | ICD-10-CM

## 2019-11-05 NOTE — Telephone Encounter (Signed)
Please deny.  Patient will need an office visit. °

## 2019-11-08 ENCOUNTER — Encounter: Payer: Self-pay | Admitting: Internal Medicine

## 2019-11-10 ENCOUNTER — Encounter: Payer: Self-pay | Admitting: Internal Medicine

## 2019-11-10 ENCOUNTER — Telehealth (INDEPENDENT_AMBULATORY_CARE_PROVIDER_SITE_OTHER): Payer: Medicare PPO | Admitting: Internal Medicine

## 2019-11-10 VITALS — Wt 198.0 lb

## 2019-11-10 DIAGNOSIS — F339 Major depressive disorder, recurrent, unspecified: Secondary | ICD-10-CM | POA: Diagnosis not present

## 2019-11-10 DIAGNOSIS — F900 Attention-deficit hyperactivity disorder, predominantly inattentive type: Secondary | ICD-10-CM

## 2019-11-10 MED ORDER — AMPHETAMINE-DEXTROAMPHETAMINE 20 MG PO TABS: ORAL_TABLET | ORAL | 0 refills | Status: DC

## 2019-11-10 MED ORDER — AMPHETAMINE-DEXTROAMPHET ER 30 MG PO CP24: 30.0000 mg | ORAL_CAPSULE | ORAL | 0 refills | Status: DC

## 2019-11-10 MED ORDER — LAMOTRIGINE 100 MG PO TABS: ORAL_TABLET | ORAL | 1 refills | Status: DC

## 2019-11-10 NOTE — Progress Notes (Signed)
Virtual Visit via Video Note  I connected with Leslie [name] on 11/10/19 at  8:00 AM EDT by a video enabled telemedicine application and verified that I am speaking with the correct person using two identifiers.  Location patient: home Location provider: work office Persons participating in the virtual visit: patient, provider  I discussed the limitations of evaluation and management by telemedicine and the availability of in person appointments. The patient expressed understanding and agreed to proceed.   HPI: This is a scheduled visit for Adderall refills her protocol.  She has been doing well.  She has had no acute issues, she takes Adderall 30 mg in the morning and then a 20 mg tablet in the early afternoon.  She has had both Covid vaccines since we last spoke.   ROS: Constitutional: Denies fever, chills, diaphoresis, appetite change and fatigue.  HEENT: Denies photophobia, eye pain, redness, hearing loss, ear pain, congestion, sore throat, rhinorrhea, sneezing, mouth sores, trouble swallowing, neck pain, neck stiffness and tinnitus.   Respiratory: Denies SOB, DOE, cough, chest tightness,  and wheezing.   Cardiovascular: Denies chest pain, palpitations and leg swelling.  Gastrointestinal: Denies nausea, vomiting, abdominal pain, diarrhea, constipation, blood in stool and abdominal distention.  Genitourinary: Denies dysuria, urgency, frequency, hematuria, flank pain and difficulty urinating.  Endocrine: Denies: hot or cold intolerance, sweats, changes in hair or nails, polyuria, polydipsia. Musculoskeletal: Denies myalgias, back pain, joint swelling, arthralgias and gait problem.  Skin: Denies pallor, rash and wound.  Neurological: Denies dizziness, seizures, syncope, weakness, light-headedness, numbness and headaches.  Hematological: Denies adenopathy. Easy bruising, personal or family bleeding history  Psychiatric/Behavioral: Denies suicidal ideation, mood changes,  confusion, nervousness, sleep disturbance and agitation   Past Medical History:  Diagnosis Date  . ADD (attention deficit disorder)   . Allergy   . Arthritis 2016   osteoarthritis left knee  . Colon polyps   . Hypertension   . Left knee pain    chronic  . Narcotic addiction ([place])   . Obesity    post gastric bypass  . Vitamin D deficiency     Past Surgical History:  Procedure Laterality Date  . arthscopic knee surgery Left    several times  . BREAST BIOPSY    . BREAST SURGERY  years ago   breast biopsy  . CHOLECYSTECTOMY    . EYE SURGERY Bilateral 2013   Toric Lens implants  . EYE SURGERY Right 2014   corneal amniotic membrane  . GASTRIC BYPASS  2003  . KIDNEY DONATION  2004  . TOTAL KNEE ARTHROPLASTY Left 06/16/2015   Procedure: LEFT TOTAL KNEE ARTHROPLASTY;  Surgeon: Mcarthur Rossetti, MD;  Location: [facility];  Service: Orthopedics;  Laterality: Left;    Family History  Problem Relation Age of Onset  . Heart disease Mother        post CABG history of CHF  . COPD Father   . Diabetes Sister   . Hypertension Sister        post renal transplant  . Heart disease Brother        CAD    SOCIAL HX:   reports that she quit smoking about 27 years ago. She has never used smokeless tobacco. She reports current alcohol use. She reports that she does not use drugs.   Current Outpatient Medications:  .  amphetamine-dextroamphetamine (ADDERALL XR) 30 MG 24 hr capsule, Take 1 capsule (30 mg total) by mouth every morning., Disp: 30 capsule, Rfl: 0 .  amphetamine-dextroamphetamine (ADDERALL XR) 30 MG 24 hr capsule, Take 1 capsule (30 mg total) by mouth every morning. FILL IN ONE MONTH, Disp: 30 capsule, Rfl: 0 .  amphetamine-dextroamphetamine (ADDERALL XR) 30 MG 24 hr capsule, Take 1 capsule (30 mg total) by mouth every morning., Disp: 30 capsule, Rfl: 0 .  amphetamine-dextroamphetamine (ADDERALL) 20 MG tablet, Take 1/2 to 1 tablet by mouth every evening if needed., Disp: 30  tablet, Rfl: 0 .  amphetamine-dextroamphetamine (ADDERALL) 20 MG tablet, take 1/2 to 1 tablet by mouth every evening if needed, Disp: 30 tablet, Rfl: 0 .  amphetamine-dextroamphetamine (ADDERALL) 20 MG tablet, Take 1/2 to 1 tablet by mouth every evening if needed., Disp: 30 tablet, Rfl: 0 .  Ascorbic Acid (VITAMIN C) 1000 MG tablet, Take 1,000 mg by mouth daily. , Disp: , Rfl:  .  calcium-vitamin D (SM CALCIUM 500/VITAMIN D3) 500-400 MG-UNIT tablet, Take 1 tablet by mouth daily. , Disp: , Rfl:  .  FLUoxetine (PROZAC) 40 MG capsule, Take 1 capsule (40 mg total) by mouth daily., Disp: 90 capsule, Rfl: 1 .  hydrochlorothiazide (HYDRODIURIL) 25 MG tablet, Take 1 tablet (25 mg total) by mouth daily., Disp: 90 tablet, Rfl: 1 .  lamoTRIgine (LAMICTAL) 100 MG tablet, TAKE 1 TABLET(100 MG) BY MOUTH TWICE DAILY, Disp: 180 tablet, Rfl: 1 .  traMADol (ULTRAM) 50 MG tablet, 1-2 tablets every 8 hours as needed for pain, Disp: 30 tablet, Rfl: 0 .  traMADol (ULTRAM) 50 MG tablet, Take 1 tablet (50 mg total) by mouth 3 (three) times daily as needed., Disp: 30 tablet, Rfl: 0 .  triamcinolone (NASACORT) 55 MCG/ACT AERO nasal inhaler, Place 2 sprays into the nose daily., Disp: 1 Inhaler, Rfl: 3 .  zolpidem (AMBIEN) 10 MG tablet, Take 1 tablet (10 mg total) by mouth at bedtime., Disp: 90 tablet, Rfl: 0  EXAM:   VITALS per patient if applicable: None reported  GENERAL: alert, oriented, appears well and in no acute distress  HEENT: atraumatic, conjunttiva clear, no obvious abnormalities on inspection of external nose and ears  NECK: normal movements of the head and neck  LUNGS: on inspection no signs of respiratory distress, breathing rate appears normal, no obvious gross increased work of breathing, gasping or wheezing  CV: no obvious cyanosis  MS: moves all visible extremities without noticeable abnormality  PSYCH/NEURO: pleasant and cooperative, no obvious depression or anxiety, speech and thought  processing grossly intact  ASSESSMENT AND PLAN:   Attention deficit hyperactivity disorder (ADHD), predominantly inattentive type -Refill Adderall x3 months. -PDMP has been reviewed, overdose risk score is 320.  She does have 1 red flag for greater than 5 opioid providers in the last 2 years.  I do not prescribe opiates for her, this comes from different orthopedists.  Depression, recurrent (HCC)  - Plan: lamoTRIgine (LAMICTAL) 100 MG tablet -Mood has been stable, check PHQ-9 at next office visit     I discussed the assessment and treatment plan with the patient. The patient was provided an opportunity to ask questions and all were answered. The patient agreed with the plan and demonstrated an understanding of the instructions.   The patient was advised to call back or seek an in-person evaluation if the symptoms worsen or if the condition fails to improve as anticipated.    Chaya Jan, MD  [facility] at [facility]

## 2019-12-16 ENCOUNTER — Other Ambulatory Visit: Payer: Self-pay | Admitting: Family

## 2019-12-20 ENCOUNTER — Other Ambulatory Visit: Payer: Self-pay | Admitting: Family

## 2019-12-21 MED ORDER — TRAMADOL HCL 50 MG PO TABS: 50.0000 mg | ORAL_TABLET | Freq: Three times a day (TID) | ORAL | 0 refills | Status: DC | PRN

## 2020-01-04 ENCOUNTER — Encounter: Payer: Self-pay | Admitting: Internal Medicine

## 2020-01-04 DIAGNOSIS — G479 Sleep disorder, unspecified: Secondary | ICD-10-CM

## 2020-01-04 MED ORDER — ZOLPIDEM TARTRATE 10 MG PO TABS: 10.0000 mg | ORAL_TABLET | Freq: Every day | ORAL | 0 refills | Status: DC

## 2020-01-23 ENCOUNTER — Other Ambulatory Visit: Payer: Self-pay | Admitting: Internal Medicine

## 2020-01-23 DIAGNOSIS — I10 Essential (primary) hypertension: Secondary | ICD-10-CM

## 2020-01-23 DIAGNOSIS — F339 Major depressive disorder, recurrent, unspecified: Secondary | ICD-10-CM

## 2020-01-26 DIAGNOSIS — H524 Presbyopia: Secondary | ICD-10-CM | POA: Diagnosis not present

## 2020-01-27 ENCOUNTER — Other Ambulatory Visit: Payer: Self-pay | Admitting: Family

## 2020-01-28 MED ORDER — TRAMADOL HCL 50 MG PO TABS: 50.0000 mg | ORAL_TABLET | Freq: Three times a day (TID) | ORAL | 0 refills | Status: DC | PRN

## 2020-01-28 NOTE — Telephone Encounter (Signed)
Do you want to refill? 

## 2020-01-30 ENCOUNTER — Other Ambulatory Visit: Payer: Self-pay | Admitting: Internal Medicine

## 2020-01-30 DIAGNOSIS — G479 Sleep disorder, unspecified: Secondary | ICD-10-CM

## 2020-02-04 ENCOUNTER — Encounter: Payer: Self-pay | Admitting: Internal Medicine

## 2020-02-04 ENCOUNTER — Encounter: Payer: Self-pay | Admitting: Orthopaedic Surgery

## 2020-02-04 DIAGNOSIS — F900 Attention-deficit hyperactivity disorder, predominantly inattentive type: Secondary | ICD-10-CM

## 2020-02-04 MED ORDER — AMPHETAMINE-DEXTROAMPHETAMINE 20 MG PO TABS: ORAL_TABLET | ORAL | 0 refills | Status: DC

## 2020-02-04 MED ORDER — AMPHETAMINE-DEXTROAMPHET ER 30 MG PO CP24: 30.0000 mg | ORAL_CAPSULE | ORAL | 0 refills | Status: DC

## 2020-02-08 ENCOUNTER — Encounter: Payer: Self-pay | Admitting: Family Medicine

## 2020-02-08 ENCOUNTER — Other Ambulatory Visit: Payer: Self-pay

## 2020-02-08 ENCOUNTER — Ambulatory Visit (INDEPENDENT_AMBULATORY_CARE_PROVIDER_SITE_OTHER): Payer: Medicare PPO | Admitting: Family Medicine

## 2020-02-08 ENCOUNTER — Ambulatory Visit (INDEPENDENT_AMBULATORY_CARE_PROVIDER_SITE_OTHER): Payer: Medicare PPO

## 2020-02-08 DIAGNOSIS — M5442 Lumbago with sciatica, left side: Secondary | ICD-10-CM | POA: Diagnosis not present

## 2020-02-08 MED ORDER — HYDROCODONE-ACETAMINOPHEN 5-325 MG PO TABS: 1.0000 | ORAL_TABLET | Freq: Four times a day (QID) | ORAL | 0 refills | Status: DC | PRN

## 2020-02-08 MED ORDER — TIZANIDINE HCL 2 MG PO TABS: 2.0000 mg | ORAL_TABLET | Freq: Four times a day (QID) | ORAL | 1 refills | Status: DC | PRN

## 2020-02-08 NOTE — Progress Notes (Signed)
Left sided lower back pain with left leg pain Pain for a couple weeks No known injury Been taking ibuprofen for pain

## 2020-02-08 NOTE — Progress Notes (Signed)
Office Visit Note   Patient: Leslie [name]           Date of Birth: [birth year]           MRN: [id number] Visit Date: 02/08/2020 Requested by: Philip Aspen, Limmie Patricia, MD [address] [place],  [place] [address] PCP: Philip Aspen, Limmie Patricia, MD  Subjective: Chief Complaint  Patient presents with  . Lower Back - Pain    HPI: 69yo F presenting to clinic with concerns of acute low back pain which started insidiously a few weeks ago. Patient denies injuries or changes to her behavior/exercise before the start of her pain. Pain radiates to the back of her left knee along her hamstring, but not all the way into her foot. She describes the pain as 'electric' in nature, but denies numbness/tingling, or weakness. Pain is worse with walking, esp after periods of rest (first getting up in the morning or after prolonged sitting). She has tried Ibuprofen 800mg  (taking every 6 hrs), and states that this does improve her symptoms, however she is scared to continue taking it as she has a history of kidney injury.               ROS:   All other systems were reviewed and are negative.  Objective: Vital Signs: There were no vitals taken for this visit.  Physical Exam:  General:  Alert and oriented, in no acute distress. Pulm:  Breathing unlabored. Psy:  Normal mood, congruent affect. Skin:  No rashes or bruising.   BACK EXAM:  Normal gait. Normal spinal curvature.  Lower extremity strength and sensation intact bilaterally. 2+ Patellar reflexes bilaterally. Brisk capillary refill bilateral feet.  TTP along lower lumbar paraspinal muscles, esp at L5-S1 junction, L>R. Some pain with palpation of L SI Joint, as well as within L gluteal musculature and along piriformis.  SLR Negative bilaterally.   Imaging: XR Lumbar Spine 2-3 Views  Result Date: 02/08/2020 X-rays lumbar spine reveal degenerative disc disease, most pronounced at L4-5 and L5-S1.  No definite compression fracture seen.   No sign of neoplasm.  She has mild to moderate bilateral SI joint DJD.  Hip joints have mild to moderate arthritis.   Assessment & Plan: 69yo F presenting to clinic with acute lower back pain xseveral weeks, with L sided radiculopathy. No red flag symptoms in clinic today. Strength and sensation intact throughout BLE. Suspect patient would benefit from physical therapy to focus on her lower back. Will also provide Norco and Zanaflex for pain control- discussed the potential for constipation and addiction with this medicine, and patient expresses understanding. If no benefit with physical therapy, RTC for reassessment and consideration of MRI. Patient is agreeable with plan.      Procedures: No procedures performed  No notes on file     PMFS History: Patient Active Problem List   Diagnosis Date Noted  . Sleep disorder 07/10/2018  . ADHD (attention deficit hyperactivity disorder) 10/01/2017  . Unilateral primary osteoarthritis, right knee 08/20/2017  . Chronic pain of both shoulders 08/20/2017  . Complete tear of right rotator cuff 04/29/2017  . Osteoarthritis of left knee 06/16/2015  . Status post total left knee replacement 06/16/2015  . MYALGIA 12/19/2009  . SUBSTANCE ABUSE 02/06/2009  . KNEE PAIN, LEFT 10/06/2008  . Attention deficit disorder 09/14/2007  . COLONIC POLYPS, HX OF 08/03/2007  . Essential hypertension 01/20/2007  . OBESITY NOS 12/04/2006  . Allergic rhinitis 12/04/2006  . GASTROJEJUNOSTOMY, HX OF 12/04/2006  .  BREAST BIOPSY, HX OF 12/04/2006   Past Medical History:  Diagnosis Date  . ADD (attention deficit disorder)   . Allergy   . Arthritis 2016   osteoarthritis left knee  . Colon polyps   . Hypertension   . Left knee pain    chronic  . Narcotic addiction ([facility])   . Obesity    post gastric bypass  . Vitamin D deficiency     Family History  Problem Relation Age of Onset  . Heart disease Mother        post CABG history of CHF  . COPD Father   .  Diabetes Sister   . Hypertension Sister        post renal transplant  . Heart disease Brother        CAD    Past Surgical History:  Procedure Laterality Date  . arthscopic knee surgery Left    several times  . BREAST BIOPSY    . BREAST SURGERY  years ago   breast biopsy  . CHOLECYSTECTOMY    . EYE SURGERY Bilateral 2013   Toric Lens implants  . EYE SURGERY Right 2014   corneal amniotic membrane  . GASTRIC BYPASS  2003  . KIDNEY DONATION  2004  . TOTAL KNEE ARTHROPLASTY Left 06/16/2015   Procedure: LEFT TOTAL KNEE ARTHROPLASTY;  Surgeon: Kathryne Hitch, MD;  Location: [facility];  Service: Orthopedics;  Laterality: Left;   Social History   Occupational History  . Not on file  Tobacco Use  . Smoking status: Former Smoker    Quit date: 07/22/1992    Years since quitting: 27.5  . Smokeless tobacco: Never Used  Substance and Sexual Activity  . Alcohol use: Yes    Comment: once every 6 months  . Drug use: No  . Sexual activity: Not on file

## 2020-02-08 NOTE — Progress Notes (Signed)
I saw and examined the patient with Dr. Marga Hoots and agree with assessment and plan as outlined.    Low back and left leg pain with lumbar DDD on x-ray, possible disc protrusion based on symptoms.    Will try PT, zanaflex.  Recommended that she stop NSAIDs due to solitary kidney.  Consider MRI, ESI if fails to improve.

## 2020-02-15 ENCOUNTER — Other Ambulatory Visit: Payer: Self-pay | Admitting: Family Medicine

## 2020-02-16 ENCOUNTER — Ambulatory Visit (INDEPENDENT_AMBULATORY_CARE_PROVIDER_SITE_OTHER): Payer: Medicare PPO | Admitting: Internal Medicine

## 2020-02-16 ENCOUNTER — Ambulatory Visit: Payer: Medicare PPO | Admitting: Orthopaedic Surgery

## 2020-02-16 ENCOUNTER — Other Ambulatory Visit: Payer: Self-pay | Admitting: Internal Medicine

## 2020-02-16 ENCOUNTER — Other Ambulatory Visit: Payer: Self-pay

## 2020-02-16 ENCOUNTER — Encounter: Payer: Self-pay | Admitting: Internal Medicine

## 2020-02-16 VITALS — BP 130/80 | HR 92 | Temp 97.8°F | Ht 64.0 in | Wt 204.0 lb

## 2020-02-16 DIAGNOSIS — F900 Attention-deficit hyperactivity disorder, predominantly inattentive type: Secondary | ICD-10-CM

## 2020-02-16 DIAGNOSIS — I10 Essential (primary) hypertension: Secondary | ICD-10-CM

## 2020-02-16 DIAGNOSIS — Z780 Asymptomatic menopausal state: Secondary | ICD-10-CM

## 2020-02-16 DIAGNOSIS — Z13820 Encounter for screening for osteoporosis: Secondary | ICD-10-CM

## 2020-02-16 DIAGNOSIS — M1712 Unilateral primary osteoarthritis, left knee: Secondary | ICD-10-CM

## 2020-02-16 DIAGNOSIS — Z1283 Encounter for screening for malignant neoplasm of skin: Secondary | ICD-10-CM

## 2020-02-16 DIAGNOSIS — M79605 Pain in left leg: Secondary | ICD-10-CM | POA: Diagnosis not present

## 2020-02-16 DIAGNOSIS — M545 Low back pain: Secondary | ICD-10-CM | POA: Diagnosis not present

## 2020-02-16 DIAGNOSIS — Z1211 Encounter for screening for malignant neoplasm of colon: Secondary | ICD-10-CM

## 2020-02-16 DIAGNOSIS — Z0000 Encounter for general adult medical examination without abnormal findings: Secondary | ICD-10-CM

## 2020-02-16 MED ORDER — AMPHETAMINE-DEXTROAMPHETAMINE 20 MG PO TABS: ORAL_TABLET | ORAL | 0 refills | Status: DC

## 2020-02-16 MED ORDER — AMPHETAMINE-DEXTROAMPHET ER 30 MG PO CP24: 30.0000 mg | ORAL_CAPSULE | ORAL | 0 refills | Status: DC

## 2020-02-16 MED ORDER — HYDROCODONE-ACETAMINOPHEN 5-325 MG PO TABS: 1.0000 | ORAL_TABLET | Freq: Four times a day (QID) | ORAL | 0 refills | Status: DC | PRN

## 2020-02-16 NOTE — Progress Notes (Signed)
Established Patient Office Visit     This visit occurred during the SARS-CoV-2 public health emergency.  Safety protocols were in place, including screening questions prior to the visit, additional usage of staff PPE, and extensive cleaning of exam room while observing appropriate contact time as indicated for disinfecting solutions.    CC/Reason for Visit: Annual preventive exam and subsequent Medicare wellness visit  HPI: Leslie [name] is a 69 y.o. female who is coming in today for the above mentioned reasons. Past Medical History is significant for: ADHD on Adderall, depression, hypertension.  She has routine eye and dental care, she does not exercise routinely.  She is overdue for screening colonoscopy.  She had a mammogram in April that was negative.  She recently had a bout of left-sided sciatica and saw Ortho and is scheduled to start physical therapy soon.  She has had both of her Covid vaccines.   Past Medical/Surgical History: Past Medical History:  Diagnosis Date  . ADD (attention deficit disorder)   . Allergy   . Arthritis 2016   osteoarthritis left knee  . Colon polyps   . Hypertension   . Left knee pain    chronic  . Narcotic addiction ([place])   . Obesity    post gastric bypass  . Vitamin D deficiency     Past Surgical History:  Procedure Laterality Date  . arthscopic knee surgery Left    several times  . BREAST BIOPSY    . BREAST SURGERY  years ago   breast biopsy  . CHOLECYSTECTOMY    . EYE SURGERY Bilateral 2013   Toric Lens implants  . EYE SURGERY Right 2014   corneal amniotic membrane  . GASTRIC BYPASS  2003  . KIDNEY DONATION  2004  . TOTAL KNEE ARTHROPLASTY Left 06/16/2015   Procedure: LEFT TOTAL KNEE ARTHROPLASTY;  Surgeon: Mcarthur Rossetti, MD;  Location: [facility];  Service: Orthopedics;  Laterality: Left;    Social History:  reports that she quit smoking about 27 years ago. She has never used smokeless tobacco. She reports current  alcohol use. She reports that she does not use drugs.  Allergies: Allergies  Allergen Reactions  . Sulfamethoxazole Nausea And Vomiting    See patient list of med intolerances due to h/o gastric bypass and nephrectomy    Family History:  Family History  Problem Relation Age of Onset  . Heart disease Mother        post CABG history of CHF  . COPD Father   . Diabetes Sister   . Hypertension Sister        post renal transplant  . Heart disease Brother        CAD     Current Outpatient Medications:  .  amphetamine-dextroamphetamine (ADDERALL XR) 30 MG 24 hr capsule, Take 1 capsule (30 mg total) by mouth every morning., Disp: 30 capsule, Rfl: 0 .  amphetamine-dextroamphetamine (ADDERALL XR) 30 MG 24 hr capsule, Take 1 capsule (30 mg total) by mouth every morning. [name] IN ONE MONTH, Disp: 30 capsule, Rfl: 0 .  amphetamine-dextroamphetamine (ADDERALL XR) 30 MG 24 hr capsule, Take 1 capsule (30 mg total) by mouth every morning., Disp: 30 capsule, Rfl: 0 .  amphetamine-dextroamphetamine (ADDERALL) 20 MG tablet, Take 1/2 to 1 tablet by mouth every evening if needed., Disp: 30 tablet, Rfl: 0 .  amphetamine-dextroamphetamine (ADDERALL) 20 MG tablet, take 1/2 to 1 tablet by mouth every evening if needed, Disp: 30 tablet, Rfl: 0 .  amphetamine-dextroamphetamine (ADDERALL) 20 MG tablet, Take 1/2 to 1 tablet by mouth every evening if needed., Disp: 30 tablet, Rfl: 0 .  Ascorbic Acid (VITAMIN C) 1000 MG tablet, Take 1,000 mg by mouth daily. , Disp: , Rfl:  .  Calcium Carb-Cholecalciferol (OYSTER SHELL CALCIUM) 500-400 MG-UNIT TABS, Take by mouth., Disp: , Rfl:  .  FLUoxetine (PROZAC) 40 MG capsule, TAKE 1 CAPSULE(40 MG) BY MOUTH DAILY, Disp: 90 capsule, Rfl: 1 .  hydrochlorothiazide (HYDRODIURIL) 25 MG tablet, TAKE 1 TABLET(25 MG) BY MOUTH DAILY, Disp: 90 tablet, Rfl: 1 .  lamoTRIgine (LAMICTAL) 100 MG tablet, TAKE 1 TABLET(100 MG) BY MOUTH TWICE DAILY, Disp: 180 tablet, Rfl: 1 .  tiZANidine  (ZANAFLEX) 2 MG tablet, Take 1-2 tablets (2-4 mg total) by mouth every 6 (six) hours as needed for muscle spasms., Disp: 60 tablet, Rfl: 1 .  traMADol (ULTRAM) 50 MG tablet, Take 1 tablet (50 mg total) by mouth 3 (three) times daily as needed., Disp: 30 tablet, Rfl: 0 .  triamcinolone (NASACORT) 55 MCG/ACT AERO nasal inhaler, Place 2 sprays into the nose daily., Disp: 1 Inhaler, Rfl: 3 .  zolpidem (AMBIEN) 10 MG tablet, TAKE 1 TABLET(10 MG) BY MOUTH AT BEDTIME, Disp: 30 tablet, Rfl: 2  Review of Systems:  Constitutional: Denies fever, chills, diaphoresis, appetite change and fatigue.  HEENT: Denies photophobia, eye pain, redness, hearing loss, ear pain, congestion, sore throat, rhinorrhea, sneezing, mouth sores, trouble swallowing, neck pain, neck stiffness and tinnitus.   Respiratory: Denies SOB, DOE, cough, chest tightness,  and wheezing.   Cardiovascular: Denies chest pain, palpitations and leg swelling.  Gastrointestinal: Denies nausea, vomiting, abdominal pain, diarrhea, constipation, blood in stool and abdominal distention.  Genitourinary: Denies dysuria, urgency, frequency, hematuria, flank pain and difficulty urinating.  Endocrine: Denies: hot or cold intolerance, sweats, changes in hair or nails, polyuria, polydipsia. Musculoskeletal: Denies myalgias, back pain, joint swelling, arthralgias and gait problem.  Skin: Denies pallor, rash and wound.  Neurological: Denies dizziness, seizures, syncope, weakness, light-headedness, numbness and headaches.  Hematological: Denies adenopathy. Easy bruising, personal or family bleeding history  Psychiatric/Behavioral: Denies suicidal ideation, mood changes, confusion, nervousness, sleep disturbance and agitation    Physical Exam: Vitals:   02/16/20 0901  BP: (!) 130/80  Pulse: 92  Temp: 97.8 F (36.6 C)  TempSrc: Oral  SpO2: 97%  Weight: (!) 204 lb (92.5 kg)  Height: '5\' 4"'$  (1.626 m)    Body mass index is 35.02  kg/m.   Constitutional: NAD, calm, comfortable Eyes: PERRL, lids and conjunctivae normal, wears corrective lenses ENMT: Mucous membranes are moist. Tympanic membrane is pearly white, no erythema or bulging. Neck: normal, supple, no masses, no thyromegaly Respiratory: clear to auscultation bilaterally, no wheezing, no crackles. Normal respiratory effort. No accessory muscle use.  Cardiovascular: Regular rate and rhythm, no murmurs / rubs / gallops. No extremity edema. 2+ pedal pulses.  Abdomen: no tenderness, no masses palpated. No hepatosplenomegaly. Bowel sounds positive.  Musculoskeletal: no clubbing / cyanosis. No joint deformity upper and lower extremities. Good ROM, no contractures. Normal muscle tone.  Skin: no rashes, lesions, ulcers. No induration Neurologic: CN 2-12 grossly intact. Sensation intact, DTR normal. Strength 5/5 in all 4.  Psychiatric: Normal judgment and insight. Alert and oriented x 3. Normal mood.    Subsequent Medicare wellness visit   1. Risk factors, based on past  M,S,F -cardiovascular disease risk factors include age, history of hypertension   2.  Physical activities: She is quite sedentary   3.  Depression/mood:  Stable, not depressed   4.  Hearing:  No perceived issues   5.  ADL's: Independent in all ADLs   6.  Fall risk:  Low fall risk   7.  Home safety: No problems identified   8.  Height weight, and visual acuity: Height and weight as above, visual acuity is 20/20 with each eye independently and together with correction   9.  Counseling:  Advised dermatology referral for skin check and GI referral for screening colonoscopy   10. Lab orders based on risk factors: Laboratory update will be reviewed   11. Referral :  GI and dermatology   12. Care plan:  Follow-up in 3 months   13. Cognitive assessment:  No cognitive impairment   14. Screening: Patient provided with a written and personalized 5-10 year screening schedule in the AVS.   yes    15. Provider List Update:   PCP only   16. Advance Directives: Full code     Office Visit from 02/16/2020 in [place] at [facility] Total Score 1      Fall Risk  02/16/2020 11/10/2019 01/05/2018 11/08/2016 07/26/2016  Falls in the past year? 1 0 No No No  Number falls in past yr: 0 0 - - -  Injury with Fall? 1 0 - - -  Risk for fall due to : - - - - -  Risk for fall due to: Comment - - - - -     Impression and Plan:  Encounter for preventive health examination -She has routine dental care. -Immunizations are up-to-date and age-appropriate. -Screening labs today. -Healthy lifestyle discussed in detail. -Refer to GI for screening colonoscopy. -Mammogram and Pap smear are up-to-date. -DEXA scan requested. -Refer to dermatology for screening.  Attention deficit hyperactivity disorder (ADHD), predominantly inattentive type  -PDMP reviewed overdose risk score 280. -Refill Adderall x3 months at current dosage.  Essential hypertension -Well-controlled    Patient Instructions  -Nice seeing you today!!  -Lab work today; will notify you once results are available.  -Referral to GI and dermatology today.  -See you back in 3 months.   Preventive Care 72 Years and Older, Female Preventive care refers to lifestyle choices and visits with your health care provider that can promote health and wellness. This includes:  A yearly physical exam. This is also called an annual well check.  Regular dental and eye exams.  Immunizations.  Screening for certain conditions.  Healthy lifestyle choices, such as diet and exercise. What can I expect for my preventive care visit? Physical exam Your health care provider will check:  Height and weight. These may be used to calculate body mass index (BMI), which is a measurement that tells if you are at a healthy weight.  Heart rate and blood pressure.  Your skin for abnormal spots. Counseling Your health care provider  may ask you questions about:  Alcohol, tobacco, and drug use.  Emotional well-being.  Home and relationship well-being.  Sexual activity.  Eating habits.  History of falls.  Memory and ability to understand (cognition).  Work and work Statistician.  Pregnancy and menstrual history. What immunizations do I need?  Influenza (flu) vaccine  This is recommended every year. Tetanus, diphtheria, and pertussis (Tdap) vaccine  You may need a Td booster every 10 years. Varicella (chickenpox) vaccine  You may need this vaccine if you have not already been vaccinated. Zoster (shingles) vaccine  You may need this after age 66. Pneumococcal conjugate (PCV13) vaccine  One dose is recommended after age 67. Pneumococcal polysaccharide (PPSV23) vaccine  One dose is recommended after age 52. Measles, mumps, and rubella (MMR) vaccine  You may need at least one dose of MMR if you were born in 1957 or later. You may also need a second dose. Meningococcal conjugate (MenACWY) vaccine  You may need this if you have certain conditions. Hepatitis A vaccine  You may need this if you have certain conditions or if you travel or work in places where you may be exposed to hepatitis A. Hepatitis B vaccine  You may need this if you have certain conditions or if you travel or work in places where you may be exposed to hepatitis B. Haemophilus influenzae type b (Hib) vaccine  You may need this if you have certain conditions. You may receive vaccines as individual doses or as more than one vaccine together in one shot (combination vaccines). Talk with your health care provider about the risks and benefits of combination vaccines. What tests do I need? Blood tests  Lipid and cholesterol levels. These may be checked every 5 years, or more frequently depending on your overall health.  Hepatitis C test.  Hepatitis B test. Screening  Lung cancer screening. You may have this screening every year  starting at age 54 if you have a 30-pack-year history of smoking and currently smoke or have quit within the past 15 years.  Colorectal cancer screening. All adults should have this screening starting at age 69 and continuing until age 50. Your health care provider may recommend screening at age 17 if you are at increased risk. You will have tests every 1-10 years, depending on your results and the type of screening test.  Diabetes screening. This is done by checking your blood sugar (glucose) after you have not eaten for a while (fasting). You may have this done every 1-3 years.  Mammogram. This may be done every 1-2 years. Talk with your health care provider about how often you should have regular mammograms.  BRCA-related cancer screening. This may be done if you have a family history of breast, ovarian, tubal, or peritoneal cancers. Other tests  Sexually transmitted disease (STD) testing.  Bone density scan. This is done to screen for osteoporosis. You may have this done starting at age 64. Follow these instructions at home: Eating and drinking  Eat a diet that includes fresh fruits and vegetables, whole grains, lean protein, and low-fat dairy products. Limit your intake of foods with high amounts of sugar, saturated fats, and salt.  Take vitamin and mineral supplements as recommended by your health care provider.  Do not drink alcohol if your health care provider tells you not to drink.  If you drink alcohol: ? Limit how much you have to 0-1 drink a day. ? Be aware of how much alcohol is in your drink. In the [place] one drink equals one 12 oz bottle of beer (355 mL), one 5 oz glass of wine (148 mL), or one 1 oz glass of hard liquor (44 mL). Lifestyle  Take daily care of your teeth and gums.  Stay active. Exercise for at least 30 minutes on 5 or more days each week.  Do not use any products that contain nicotine or tobacco, such as cigarettes, e-cigarettes, and chewing tobacco. If  you need help quitting, ask your health care provider.  If you are sexually active, practice safe sex. Use a condom or other form of protection in order to prevent STIs (sexually  transmitted infections).  Talk with your health care provider about taking a low-dose aspirin or statin. What's next?  Go to your health care provider once a year for a well check visit.  Ask your health care provider how often you should have your eyes and teeth checked.  Stay up to date on all vaccines. This information is not intended to replace advice given to you by your health care provider. Make sure you discuss any questions you have with your health care provider. Document Revised: 07/02/2018 Document Reviewed: 07/02/2018 Elsevier Patient Education  2020 [place], [place] [facility]

## 2020-02-16 NOTE — Telephone Encounter (Signed)
Please advise 

## 2020-02-16 NOTE — Addendum Note (Signed)
Addended by: Kern Reap B on: 02/16/2020 09:50 AM   Modules accepted: Orders

## 2020-02-16 NOTE — Addendum Note (Signed)
Addended by: Chaya Jan Y on: 02/16/2020 10:16 AM   Modules accepted: Orders

## 2020-02-16 NOTE — Patient Instructions (Signed)
-Nice seeing you today!!  -Lab work today; will notify you once results are available.  -Referral to GI and dermatology today.  -See you back in 3 months.   Preventive Care 70 Years and Older, Female Preventive care refers to lifestyle choices and visits with your health care provider that can promote health and wellness. This includes:  A yearly physical exam. This is also called an annual well check.  Regular dental and eye exams.  Immunizations.  Screening for certain conditions.  Healthy lifestyle choices, such as diet and exercise. What can I expect for my preventive care visit? Physical exam Your health care provider will check:  Height and weight. These may be used to calculate body mass index (BMI), which is a measurement that tells if you are at a healthy weight.  Heart rate and blood pressure.  Your skin for abnormal spots. Counseling Your health care provider may ask you questions about:  Alcohol, tobacco, and drug use.  Emotional well-being.  Home and relationship well-being.  Sexual activity.  Eating habits.  History of falls.  Memory and ability to understand (cognition).  Work and work Statistician.  Pregnancy and menstrual history. What immunizations do I need?  Influenza (flu) vaccine  This is recommended every year. Tetanus, diphtheria, and pertussis (Tdap) vaccine  You may need a Td booster every 10 years. Varicella (chickenpox) vaccine  You may need this vaccine if you have not already been vaccinated. Zoster (shingles) vaccine  You may need this after age 38. Pneumococcal conjugate (PCV13) vaccine  One dose is recommended after age 68. Pneumococcal polysaccharide (PPSV23) vaccine  One dose is recommended after age 52. Measles, mumps, and rubella (MMR) vaccine  You may need at least one dose of MMR if you were born in 1957 or later. You may also need a second dose. Meningococcal conjugate (MenACWY) vaccine  You may need  this if you have certain conditions. Hepatitis A vaccine  You may need this if you have certain conditions or if you travel or work in places where you may be exposed to hepatitis A. Hepatitis B vaccine  You may need this if you have certain conditions or if you travel or work in places where you may be exposed to hepatitis B. Haemophilus influenzae type b (Hib) vaccine  You may need this if you have certain conditions. You may receive vaccines as individual doses or as more than one vaccine together in one shot (combination vaccines). Talk with your health care provider about the risks and benefits of combination vaccines. What tests do I need? Blood tests  Lipid and cholesterol levels. These may be checked every 5 years, or more frequently depending on your overall health.  Hepatitis C test.  Hepatitis B test. Screening  Lung cancer screening. You may have this screening every year starting at age 91 if you have a 30-pack-year history of smoking and currently smoke or have quit within the past 15 years.  Colorectal cancer screening. All adults should have this screening starting at age 78 and continuing until age 37. Your health care provider may recommend screening at age 84 if you are at increased risk. You will have tests every 1-10 years, depending on your results and the type of screening test.  Diabetes screening. This is done by checking your blood sugar (glucose) after you have not eaten for a while (fasting). You may have this done every 1-3 years.  Mammogram. This may be done every 1-2 years. Talk with your health  care provider about how often you should have regular mammograms.  BRCA-related cancer screening. This may be done if you have a family history of breast, ovarian, tubal, or peritoneal cancers. Other tests  Sexually transmitted disease (STD) testing.  Bone density scan. This is done to screen for osteoporosis. You may have this done starting at age 28. Follow  these instructions at home: Eating and drinking  Eat a diet that includes fresh fruits and vegetables, whole grains, lean protein, and low-fat dairy products. Limit your intake of foods with high amounts of sugar, saturated fats, and salt.  Take vitamin and mineral supplements as recommended by your health care provider.  Do not drink alcohol if your health care provider tells you not to drink.  If you drink alcohol: ? Limit how much you have to 0-1 drink a day. ? Be aware of how much alcohol is in your drink. In the [place] one drink equals one 12 oz bottle of beer (355 mL), one 5 oz glass of wine (148 mL), or one 1 oz glass of hard liquor (44 mL). Lifestyle  Take daily care of your teeth and gums.  Stay active. Exercise for at least 30 minutes on 5 or more days each week.  Do not use any products that contain nicotine or tobacco, such as cigarettes, e-cigarettes, and chewing tobacco. If you need help quitting, ask your health care provider.  If you are sexually active, practice safe sex. Use a condom or other form of protection in order to prevent STIs (sexually transmitted infections).  Talk with your health care provider about taking a low-dose aspirin or statin. What's next?  Go to your health care provider once a year for a well check visit.  Ask your health care provider how often you should have your eyes and teeth checked.  Stay up to date on all vaccines. This information is not intended to replace advice given to you by your health care provider. Make sure you discuss any questions you have with your health care provider. Document Revised: 07/02/2018 Document Reviewed: 07/02/2018 Elsevier Patient Education  2020 [employer].

## 2020-02-17 ENCOUNTER — Other Ambulatory Visit: Payer: Self-pay | Admitting: Internal Medicine

## 2020-02-17 ENCOUNTER — Encounter: Payer: Self-pay | Admitting: Internal Medicine

## 2020-02-17 DIAGNOSIS — E559 Vitamin D deficiency, unspecified: Secondary | ICD-10-CM

## 2020-02-17 LAB — LIPID PANEL
Cholesterol: 220 mg/dL — ABNORMAL HIGH (ref <200)
HDL: 98 mg/dL (ref >=50)
LDL Cholesterol (Calc): 109 mg/dL (calc) — ABNORMAL HIGH
Non-HDL Cholesterol (Calc): 122 mg/dL (calc) (ref <130)
Total CHOL/HDL Ratio: 2.2 (calc) (ref <5.0)
Triglycerides: 51 mg/dL (ref <150)

## 2020-02-17 LAB — CBC WITH DIFFERENTIAL/PLATELET
Absolute Monocytes: 309 cells/uL (ref 200–950)
Basophils Absolute: 39 cells/uL (ref 0–200)
Basophils Relative: 0.8 %
Eosinophils Absolute: 191 cells/uL (ref 15–500)
Eosinophils Relative: 3.9 %
HCT: 40 % (ref 35.0–45.0)
Hemoglobin: 13.3 g/dL (ref 11.7–15.5)
Lymphs Abs: 789 cells/uL — ABNORMAL LOW (ref 850–3900)
MCH: 30.5 pg (ref 27.0–33.0)
MCHC: 33.3 g/dL (ref 32.0–36.0)
MCV: 91.7 fL (ref 80.0–100.0)
MPV: 9.9 fL (ref 7.5–12.5)
Monocytes Relative: 6.3 %
Neutro Abs: 3572 cells/uL (ref 1500–7800)
Neutrophils Relative %: 72.9 %
Platelets: 284 10*3/uL (ref 140–400)
RBC: 4.36 10*6/uL (ref 3.80–5.10)
RDW: 15.1 % — ABNORMAL HIGH (ref 11.0–15.0)
Total Lymphocyte: 16.1 %
WBC: 4.9 10*3/uL (ref 3.8–10.8)

## 2020-02-17 LAB — COMPREHENSIVE METABOLIC PANEL
AG Ratio: 1.9 (calc) (ref 1.0–2.5)
ALT: 15 U/L (ref 6–29)
AST: 20 U/L (ref 10–35)
Albumin: 4.2 g/dL (ref 3.6–5.1)
Alkaline phosphatase (APISO): 98 U/L (ref 37–153)
BUN/Creatinine Ratio: 18 (calc) (ref 6–22)
BUN: 21 mg/dL (ref 7–25)
CO2: 30 mmol/L (ref 20–32)
Calcium: 9.6 mg/dL (ref 8.6–10.4)
Chloride: 98 mmol/L (ref 98–110)
Creat: 1.15 mg/dL — ABNORMAL HIGH (ref 0.50–0.99)
Globulin: 2.2 g/dL (calc) (ref 1.9–3.7)
Glucose, Bld: 83 mg/dL (ref 65–99)
Potassium: 3.6 mmol/L (ref 3.5–5.3)
Sodium: 138 mmol/L (ref 135–146)
Total Bilirubin: 0.6 mg/dL (ref 0.2–1.2)
Total Protein: 6.4 g/dL (ref 6.1–8.1)

## 2020-02-17 LAB — VITAMIN B12: Vitamin B-12: 337 pg/mL (ref 200–1100)

## 2020-02-17 LAB — HEMOGLOBIN A1C
Hgb A1c MFr Bld: 5.4 % of total Hgb (ref <5.7)
Mean Plasma Glucose: 108 (calc)
eAG (mmol/L): 6 (calc)

## 2020-02-17 LAB — VITAMIN D 25 HYDROXY (VIT D DEFICIENCY, FRACTURES): Vit D, 25-Hydroxy: 19 ng/mL — ABNORMAL LOW (ref 30–100)

## 2020-02-17 LAB — TSH: TSH: 2.1 mIU/L (ref 0.40–4.50)

## 2020-02-17 MED ORDER — VITAMIN D (ERGOCALCIFEROL) 1.25 MG (50000 UNIT) PO CAPS: 50000.0000 [IU] | ORAL_CAPSULE | ORAL | 0 refills | Status: AC

## 2020-02-18 ENCOUNTER — Other Ambulatory Visit: Payer: Self-pay | Admitting: Internal Medicine

## 2020-02-18 DIAGNOSIS — E559 Vitamin D deficiency, unspecified: Secondary | ICD-10-CM

## 2020-02-18 DIAGNOSIS — N289 Disorder of kidney and ureter, unspecified: Secondary | ICD-10-CM

## 2020-02-22 DIAGNOSIS — M79605 Pain in left leg: Secondary | ICD-10-CM | POA: Diagnosis not present

## 2020-02-22 DIAGNOSIS — M545 Low back pain: Secondary | ICD-10-CM | POA: Diagnosis not present

## 2020-02-25 ENCOUNTER — Other Ambulatory Visit: Payer: Self-pay | Admitting: Family Medicine

## 2020-02-25 ENCOUNTER — Other Ambulatory Visit: Payer: Self-pay | Admitting: Physician Assistant

## 2020-02-25 ENCOUNTER — Encounter: Payer: Self-pay | Admitting: Family Medicine

## 2020-02-25 MED ORDER — HYDROCODONE-ACETAMINOPHEN 5-325 MG PO TABS: 1.0000 | ORAL_TABLET | Freq: Four times a day (QID) | ORAL | 0 refills | Status: DC | PRN

## 2020-02-25 NOTE — Telephone Encounter (Signed)
Can you advise? Dr Hilts out of office.  

## 2020-02-25 NOTE — Telephone Encounter (Signed)
I will call her in 8 to get through the weekend but she will have to call and talk to Dr. Prince Rome on Monday

## 2020-02-25 NOTE — Telephone Encounter (Signed)
Sending to you so you are aware.

## 2020-03-02 DIAGNOSIS — M545 Low back pain: Secondary | ICD-10-CM | POA: Diagnosis not present

## 2020-03-02 DIAGNOSIS — M79605 Pain in left leg: Secondary | ICD-10-CM | POA: Diagnosis not present

## 2020-03-04 DIAGNOSIS — M1990 Unspecified osteoarthritis, unspecified site: Secondary | ICD-10-CM | POA: Diagnosis not present

## 2020-03-04 DIAGNOSIS — Z811 Family history of alcohol abuse and dependence: Secondary | ICD-10-CM | POA: Diagnosis not present

## 2020-03-04 DIAGNOSIS — Z6835 Body mass index (BMI) 35.0-35.9, adult: Secondary | ICD-10-CM | POA: Diagnosis not present

## 2020-03-04 DIAGNOSIS — G4700 Insomnia, unspecified: Secondary | ICD-10-CM | POA: Diagnosis not present

## 2020-03-04 DIAGNOSIS — F325 Major depressive disorder, single episode, in full remission: Secondary | ICD-10-CM | POA: Diagnosis not present

## 2020-03-04 DIAGNOSIS — I10 Essential (primary) hypertension: Secondary | ICD-10-CM | POA: Diagnosis not present

## 2020-03-04 DIAGNOSIS — F909 Attention-deficit hyperactivity disorder, unspecified type: Secondary | ICD-10-CM | POA: Diagnosis not present

## 2020-03-04 DIAGNOSIS — Z823 Family history of stroke: Secondary | ICD-10-CM | POA: Diagnosis not present

## 2020-03-04 DIAGNOSIS — Z87891 Personal history of nicotine dependence: Secondary | ICD-10-CM | POA: Diagnosis not present

## 2020-03-07 DIAGNOSIS — M545 Low back pain: Secondary | ICD-10-CM | POA: Diagnosis not present

## 2020-03-07 DIAGNOSIS — M79605 Pain in left leg: Secondary | ICD-10-CM | POA: Diagnosis not present

## 2020-03-09 DIAGNOSIS — M79605 Pain in left leg: Secondary | ICD-10-CM | POA: Diagnosis not present

## 2020-03-09 DIAGNOSIS — M545 Low back pain: Secondary | ICD-10-CM | POA: Diagnosis not present

## 2020-03-16 DIAGNOSIS — M79605 Pain in left leg: Secondary | ICD-10-CM | POA: Diagnosis not present

## 2020-03-16 DIAGNOSIS — M545 Low back pain: Secondary | ICD-10-CM | POA: Diagnosis not present

## 2020-03-28 ENCOUNTER — Other Ambulatory Visit: Payer: Self-pay | Admitting: Orthopaedic Surgery

## 2020-03-28 ENCOUNTER — Ambulatory Visit: Payer: Medicare PPO | Admitting: Physical Therapy

## 2020-03-29 NOTE — Telephone Encounter (Signed)
Please advise 

## 2020-03-30 ENCOUNTER — Ambulatory Visit: Payer: Medicare PPO | Admitting: Physical Therapy

## 2020-04-03 ENCOUNTER — Encounter: Payer: Medicare PPO | Admitting: Physical Therapy

## 2020-04-05 ENCOUNTER — Encounter: Payer: Medicare PPO | Admitting: Physical Therapy

## 2020-04-10 ENCOUNTER — Encounter: Payer: Medicare PPO | Admitting: Physical Therapy

## 2020-04-22 ENCOUNTER — Other Ambulatory Visit: Payer: Self-pay | Admitting: Internal Medicine

## 2020-04-22 DIAGNOSIS — F339 Major depressive disorder, recurrent, unspecified: Secondary | ICD-10-CM

## 2020-04-26 ENCOUNTER — Other Ambulatory Visit: Payer: Self-pay | Admitting: Internal Medicine

## 2020-04-26 ENCOUNTER — Encounter: Payer: Self-pay | Admitting: Internal Medicine

## 2020-04-26 DIAGNOSIS — G479 Sleep disorder, unspecified: Secondary | ICD-10-CM

## 2020-05-01 ENCOUNTER — Encounter: Payer: Self-pay | Admitting: Internal Medicine

## 2020-05-03 ENCOUNTER — Other Ambulatory Visit: Payer: Self-pay | Admitting: Internal Medicine

## 2020-05-03 DIAGNOSIS — F900 Attention-deficit hyperactivity disorder, predominantly inattentive type: Secondary | ICD-10-CM

## 2020-05-03 MED ORDER — AMPHETAMINE-DEXTROAMPHET ER 30 MG PO CP24: 30.0000 mg | ORAL_CAPSULE | ORAL | 0 refills | Status: DC

## 2020-05-03 MED ORDER — AMPHETAMINE-DEXTROAMPHETAMINE 20 MG PO TABS: ORAL_TABLET | ORAL | 0 refills | Status: DC

## 2020-05-10 ENCOUNTER — Telehealth: Payer: Medicare PPO | Admitting: Internal Medicine

## 2020-05-10 DIAGNOSIS — F900 Attention-deficit hyperactivity disorder, predominantly inattentive type: Secondary | ICD-10-CM

## 2020-05-10 DIAGNOSIS — G479 Sleep disorder, unspecified: Secondary | ICD-10-CM

## 2020-05-10 MED ORDER — ZOLPIDEM TARTRATE 10 MG PO TABS: ORAL_TABLET | ORAL | 1 refills | Status: DC

## 2020-05-10 MED ORDER — AMPHETAMINE-DEXTROAMPHETAMINE 20 MG PO TABS: ORAL_TABLET | ORAL | 0 refills | Status: DC

## 2020-05-10 MED ORDER — AMPHETAMINE-DEXTROAMPHET ER 30 MG PO CP24: 30.0000 mg | ORAL_CAPSULE | ORAL | 0 refills | Status: DC

## 2020-05-10 NOTE — Progress Notes (Signed)
Virtual Visit via Video Note  I connected with Leslie [name] on 05/10/20 at  3:30 PM EDT by a video enabled telemedicine application and verified that I am speaking with the correct person using two identifiers.  Location patient: home Location provider: work office Persons participating in the virtual visit: patient, provider  I discussed the limitations of evaluation and management by telemedicine and the availability of in person appointments. The patient expressed understanding and agreed to proceed.   HPI: She has been doing well since her last check in.  She is currently in [place] on vacation.  She is feeling well, she has been on the same regimen of medications for years.  This is her routine 23-month check in for her controlled substance prescriptions which are Ambien and Adderall.   ROS: Constitutional: Denies fever, chills, diaphoresis, appetite change and fatigue.  HEENT: Denies photophobia, eye pain, redness, hearing loss, ear pain, congestion, sore throat, rhinorrhea, sneezing, mouth sores, trouble swallowing, neck pain, neck stiffness and tinnitus.   Respiratory: Denies SOB, DOE, cough, chest tightness,  and wheezing.   Cardiovascular: Denies chest pain, palpitations and leg swelling.  Gastrointestinal: Denies nausea, vomiting, abdominal pain, diarrhea, constipation, blood in stool and abdominal distention.  Genitourinary: Denies dysuria, urgency, frequency, hematuria, flank pain and difficulty urinating.  Endocrine: Denies: hot or cold intolerance, sweats, changes in hair or nails, polyuria, polydipsia. Musculoskeletal: Denies myalgias, back pain, joint swelling, arthralgias and gait problem.  Skin: Denies pallor, rash and wound.  Neurological: Denies dizziness, seizures, syncope, weakness, light-headedness, numbness and headaches.  Hematological: Denies adenopathy. Easy bruising, personal or family bleeding history  Psychiatric/Behavioral: Denies suicidal  ideation, mood changes, confusion, nervousness, sleep disturbance and agitation   Past Medical History:  Diagnosis Date  . ADD (attention deficit disorder)   . Allergy   . Arthritis 2016   osteoarthritis left knee  . Colon polyps   . Hypertension   . Left knee pain    chronic  . Narcotic addiction (HCC)   . Obesity    post gastric bypass  . Vitamin D deficiency     Past Surgical History:  Procedure Laterality Date  . arthscopic knee surgery Left    several times  . BREAST BIOPSY    . BREAST SURGERY  years ago   breast biopsy  . CHOLECYSTECTOMY    . EYE SURGERY Bilateral 2013   Toric Lens implants  . EYE SURGERY Right 2014   corneal amniotic membrane  . GASTRIC BYPASS  2003  . KIDNEY DONATION  2004  . TOTAL KNEE ARTHROPLASTY Left 06/16/2015   Procedure: LEFT TOTAL KNEE ARTHROPLASTY;  Surgeon: Kathryne Hitch, MD;  Location: [facility];  Service: Orthopedics;  Laterality: Left;    Family History  Problem Relation Age of Onset  . Heart disease Mother        post CABG history of CHF  . COPD Father   . Diabetes Sister   . Hypertension Sister        post renal transplant  . Heart disease Brother        CAD    SOCIAL HX:   reports that she quit smoking about 27 years ago. She has never used smokeless tobacco. She reports current alcohol use. She reports that she does not use drugs.   Current Outpatient Medications:  .  Ascorbic Acid (VITAMIN C) 1000 MG tablet, Take 1,000 mg by mouth daily. , Disp: , Rfl:  .  Calcium Carb-Cholecalciferol (OYSTER SHELL  CALCIUM) 500-400 MG-UNIT TABS, Take by mouth., Disp: , Rfl:  .  FLUoxetine (PROZAC) 40 MG capsule, TAKE 1 CAPSULE(40 MG) BY MOUTH DAILY, Disp: 90 capsule, Rfl: 1 .  hydrochlorothiazide (HYDRODIURIL) 25 MG tablet, TAKE 1 TABLET(25 MG) BY MOUTH DAILY, Disp: 90 tablet, Rfl: 1 .  lamoTRIgine (LAMICTAL) 100 MG tablet, TAKE 1 TABLET(100 MG) BY MOUTH TWICE DAILY, Disp: 180 tablet, Rfl: 1 .  triamcinolone (NASACORT) 55  MCG/ACT AERO nasal inhaler, Place 2 sprays into the nose daily., Disp: 1 Inhaler, Rfl: 3 .  amphetamine-dextroamphetamine (ADDERALL XR) 30 MG 24 hr capsule, Take 1 capsule (30 mg total) by mouth every morning., Disp: 30 capsule, Rfl: 0 .  amphetamine-dextroamphetamine (ADDERALL XR) 30 MG 24 hr capsule, Take 1 capsule (30 mg total) by mouth every morning. FILL IN ONE MONTH, Disp: 30 capsule, Rfl: 0 .  amphetamine-dextroamphetamine (ADDERALL XR) 30 MG 24 hr capsule, Take 1 capsule (30 mg total) by mouth every morning., Disp: 30 capsule, Rfl: 0 .  amphetamine-dextroamphetamine (ADDERALL) 20 MG tablet, Take 1/2 to 1 tablet by mouth every evening if needed., Disp: 30 tablet, Rfl: 0 .  amphetamine-dextroamphetamine (ADDERALL) 20 MG tablet, take 1/2 to 1 tablet by mouth every evening if needed, Disp: 30 tablet, Rfl: 0 .  amphetamine-dextroamphetamine (ADDERALL) 20 MG tablet, Take 1/2 to 1 tablet by mouth every evening if needed., Disp: 30 tablet, Rfl: 0 .  zolpidem (AMBIEN) 10 MG tablet, TAKE 1 TABLET(10 MG) BY MOUTH AT BEDTIME, Disp: 30 tablet, Rfl: 1  EXAM:   VITALS per patient if applicable: None reported  GENERAL: alert, oriented, appears well and in no acute distress  HEENT: atraumatic, conjunttiva clear, no obvious abnormalities on inspection of external nose and ears  NECK: normal movements of the head and neck  LUNGS: on inspection no signs of respiratory distress, breathing rate appears normal, no obvious gross increased work of breathing, gasping or wheezing  CV: no obvious cyanosis  MS: moves all visible extremities without noticeable abnormality  PSYCH/NEURO: pleasant and cooperative, no obvious depression or anxiety, speech and thought processing grossly intact  ASSESSMENT AND PLAN:   Attention deficit hyperactivity disorder (ADHD), predominantly inattentive type -PDMP reviewed, overdose risk score is 340, she does have 1 red flag for greater than 5 providers over the last 2  years, these have mainly been other providers in our office in the last few prescriptions have all been by myself. -Takes Adderall XR 30 mg in the morning and then an additional 20 mg tablet in the evening if needed.  Sleep disorder  - Plan: zolpidem (AMBIEN) 10 MG tablet 30 tablets x 2 refills.     I discussed the assessment and treatment plan with the patient. The patient was provided an opportunity to ask questions and all were answered. The patient agreed with the plan and demonstrated an understanding of the instructions.   The patient was advised to call back or seek an in-person evaluation if the symptoms worsen or if the condition fails to improve as anticipated.    Chaya Jan, MD  [facility] at [facility]

## 2020-05-19 ENCOUNTER — Encounter: Payer: Self-pay | Admitting: Internal Medicine

## 2020-06-28 ENCOUNTER — Ambulatory Visit: Payer: Medicare PPO | Admitting: Physician Assistant

## 2020-07-13 ENCOUNTER — Encounter: Payer: Self-pay | Admitting: Internal Medicine

## 2020-07-17 ENCOUNTER — Other Ambulatory Visit: Payer: Self-pay | Admitting: Internal Medicine

## 2020-07-17 DIAGNOSIS — G479 Sleep disorder, unspecified: Secondary | ICD-10-CM

## 2020-07-17 MED ORDER — ALPRAZOLAM 0.25 MG PO TABS: 0.2500 mg | ORAL_TABLET | Freq: Every evening | ORAL | 0 refills | Status: DC | PRN

## 2020-07-21 ENCOUNTER — Other Ambulatory Visit: Payer: Self-pay | Admitting: Internal Medicine

## 2020-07-21 DIAGNOSIS — I10 Essential (primary) hypertension: Secondary | ICD-10-CM

## 2020-07-21 DIAGNOSIS — F339 Major depressive disorder, recurrent, unspecified: Secondary | ICD-10-CM

## 2020-08-04 ENCOUNTER — Ambulatory Visit: Payer: Medicare PPO | Admitting: Physician Assistant

## 2020-08-13 ENCOUNTER — Encounter: Payer: Self-pay | Admitting: Internal Medicine

## 2020-08-13 DIAGNOSIS — G479 Sleep disorder, unspecified: Secondary | ICD-10-CM

## 2020-08-13 DIAGNOSIS — F900 Attention-deficit hyperactivity disorder, predominantly inattentive type: Secondary | ICD-10-CM

## 2020-08-15 MED ORDER — AMPHETAMINE-DEXTROAMPHET ER 30 MG PO CP24: 30.0000 mg | ORAL_CAPSULE | ORAL | 0 refills | Status: DC

## 2020-08-15 MED ORDER — ZOLPIDEM TARTRATE 10 MG PO TABS
ORAL_TABLET | ORAL | 0 refills | Status: DC
Start: 2020-08-15 — End: 2020-09-19

## 2020-08-15 MED ORDER — AMPHETAMINE-DEXTROAMPHETAMINE 20 MG PO TABS: ORAL_TABLET | ORAL | 0 refills | Status: DC

## 2020-08-15 MED ORDER — ALPRAZOLAM 0.25 MG PO TABS: 0.2500 mg | ORAL_TABLET | Freq: Every evening | ORAL | 0 refills | Status: DC | PRN

## 2020-08-31 ENCOUNTER — Other Ambulatory Visit: Payer: Self-pay | Admitting: Internal Medicine

## 2020-08-31 DIAGNOSIS — G479 Sleep disorder, unspecified: Secondary | ICD-10-CM

## 2020-09-12 ENCOUNTER — Other Ambulatory Visit: Payer: Self-pay

## 2020-09-12 ENCOUNTER — Telehealth (INDEPENDENT_AMBULATORY_CARE_PROVIDER_SITE_OTHER): Payer: Medicare PPO | Admitting: Internal Medicine

## 2020-09-12 DIAGNOSIS — G479 Sleep disorder, unspecified: Secondary | ICD-10-CM

## 2020-09-12 DIAGNOSIS — T7840XA Allergy, unspecified, initial encounter: Secondary | ICD-10-CM

## 2020-09-12 MED ORDER — ALPRAZOLAM 0.25 MG PO TABS: ORAL_TABLET | ORAL | 1 refills | Status: DC

## 2020-09-12 NOTE — Progress Notes (Signed)
Virtual Visit via Video Note  I connected with Leslie [name] on 09/12/20 at  3:45 PM EST by a video enabled telemedicine application and verified that I am speaking with the correct person using two identifiers.  Location patient: home Location provider: work office Persons participating in the virtual visit: patient, provider  I discussed the limitations of evaluation and management by telemedicine and the availability of in person appointments. The patient expressed understanding and agreed to proceed.   HPI: She has scheduled this visit for 2 reasons:  1.  She is due for refills of her Ambien that she takes for her sleeping disorder.  2.  She has been having some dental work done.  She was found to have an abscess and was put first on a course of amoxicillin and then upgraded to Augmentin.  After about 10 days she started noticing a full body erythematous, confluent papular rash.  It is very itchy, feels like a sunburn.  It is present even in not sun exposed areas, she denies any tongue swelling, throat swelling or difficulty breathing.   ROS: Constitutional: Denies fever, chills, diaphoresis, appetite change and fatigue.  HEENT: Denies photophobia, eye pain, redness, hearing loss, ear pain, congestion, sore throat, rhinorrhea, sneezing, mouth sores, trouble swallowing, neck pain, neck stiffness and tinnitus.   Respiratory: Denies SOB, DOE, cough, chest tightness,  and wheezing.   Cardiovascular: Denies chest pain, palpitations and leg swelling.  Gastrointestinal: Denies nausea, vomiting, abdominal pain, diarrhea, constipation, blood in stool and abdominal distention.  Genitourinary: Denies dysuria, urgency, frequency, hematuria, flank pain and difficulty urinating.  Endocrine: Denies: hot or cold intolerance, sweats, changes in hair or nails, polyuria, polydipsia. Musculoskeletal: Denies myalgias, back pain, joint swelling, arthralgias and gait problem.  Skin: Denies pallor  and wound.  Neurological: Denies dizziness, seizures, syncope, weakness, light-headedness, numbness and headaches.  Hematological: Denies adenopathy. Easy bruising, personal or family bleeding history  Psychiatric/Behavioral: Denies suicidal ideation, mood changes, confusion, nervousness, sleep disturbance and agitation   Past Medical History:  Diagnosis Date  . ADD (attention deficit disorder)   . Allergy   . Arthritis 2016   osteoarthritis left knee  . Colon polyps   . Hypertension   . Left knee pain    chronic  . Narcotic addiction (HCC)   . Obesity    post gastric bypass  . Vitamin D deficiency     Past Surgical History:  Procedure Laterality Date  . arthscopic knee surgery Left    several times  . BREAST BIOPSY    . BREAST SURGERY  years ago   breast biopsy  . CHOLECYSTECTOMY    . EYE SURGERY Bilateral 2013   Toric Lens implants  . EYE SURGERY Right 2014   corneal amniotic membrane  . GASTRIC BYPASS  2003  . KIDNEY DONATION  2004  . TOTAL KNEE ARTHROPLASTY Left 06/16/2015   Procedure: LEFT TOTAL KNEE ARTHROPLASTY;  Surgeon: Kathryne Hitch, MD;  Location: [facility];  Service: Orthopedics;  Laterality: Left;    Family History  Problem Relation Age of Onset  . Heart disease Mother        post CABG history of CHF  . COPD Father   . Diabetes Sister   . Hypertension Sister        post renal transplant  . Heart disease Brother        CAD    SOCIAL HX:   reports that she quit smoking about 28 years ago. She has  never used smokeless tobacco. She reports current alcohol use. She reports that she does not use drugs.   Current Outpatient Medications:  .  amphetamine-dextroamphetamine (ADDERALL XR) 30 MG 24 hr capsule, Take 1 capsule (30 mg total) by mouth every morning., Disp: 30 capsule, Rfl: 0 .  amphetamine-dextroamphetamine (ADDERALL XR) 30 MG 24 hr capsule, Take 1 capsule (30 mg total) by mouth every morning. FILL IN ONE MONTH, Disp: 30 capsule, Rfl: 0 .   amphetamine-dextroamphetamine (ADDERALL XR) 30 MG 24 hr capsule, Take 1 capsule (30 mg total) by mouth every morning., Disp: 30 capsule, Rfl: 0 .  amphetamine-dextroamphetamine (ADDERALL) 20 MG tablet, Take 1/2 to 1 tablet by mouth every evening if needed., Disp: 30 tablet, Rfl: 0 .  amphetamine-dextroamphetamine (ADDERALL) 20 MG tablet, take 1/2 to 1 tablet by mouth every evening if needed, Disp: 30 tablet, Rfl: 0 .  amphetamine-dextroamphetamine (ADDERALL) 20 MG tablet, Take 1/2 to 1 tablet by mouth every evening if needed., Disp: 30 tablet, Rfl: 0 .  Ascorbic Acid (VITAMIN C) 1000 MG tablet, Take 1,000 mg by mouth daily. , Disp: , Rfl:  .  Calcium Carb-Cholecalciferol (OYSTER SHELL CALCIUM) 500-400 MG-UNIT TABS, Take by mouth., Disp: , Rfl:  .  FLUoxetine (PROZAC) 40 MG capsule, TAKE 1 CAPSULE(40 MG) BY MOUTH DAILY, Disp: 90 capsule, Rfl: 1 .  hydrochlorothiazide (HYDRODIURIL) 25 MG tablet, TAKE 1 TABLET(25 MG) BY MOUTH DAILY, Disp: 90 tablet, Rfl: 1 .  lamoTRIgine (LAMICTAL) 100 MG tablet, TAKE 1 TABLET(100 MG) BY MOUTH TWICE DAILY, Disp: 180 tablet, Rfl: 1 .  triamcinolone (NASACORT) 55 MCG/ACT AERO nasal inhaler, Place 2 sprays into the nose daily., Disp: 1 Inhaler, Rfl: 3 .  zolpidem (AMBIEN) 10 MG tablet, TAKE 1 TABLET(10 MG) BY MOUTH AT BEDTIME, Disp: 30 tablet, Rfl: 0 .  ALPRAZolam (XANAX) 0.25 MG tablet, TAKE 1 TABLET(0.25 MG) BY MOUTH AT BEDTIME AS NEEDED FOR ANXIETY, Disp: 30 tablet, Rfl: 1  EXAM:   VITALS per patient if applicable: None provided  GENERAL: alert, oriented, appears well and in no acute distress, multiple areas over arms, chest, face, neck of confluent erythematous papules.  HEENT: atraumatic, conjunttiva clear, no obvious abnormalities on inspection of external nose and ears  NECK: normal movements of the head and neck  LUNGS: on inspection no signs of respiratory distress, breathing rate appears normal, no obvious gross increased work of breathing, gasping or  wheezing  CV: no obvious cyanosis  MS: moves all visible extremities without noticeable abnormality  PSYCH/NEURO: pleasant and cooperative, no obvious depression or anxiety, speech and thought processing grossly intact  ASSESSMENT AND PLAN:   Sleep disorder  - Plan: ALPRAZolam (XANAX) 0.25 MG tablet  Allergic reaction, initial encounter -The timing does sound like possibly amoxicillin reaction. -She will discontinue use of amoxicillin immediately, since she has already completed course of Augmentin she will discuss with her dentist whether she needs further antibiotic therapy. -Have advised she alternate Benadryl at nighttime with a daytime antihistamine such as Claritin, Zyrtec, Allegra.  She may also use an antiitch, soothing OTC topical lotion as needed.     I discussed the assessment and treatment plan with the patient. The patient was provided an opportunity to ask questions and all were answered. The patient agreed with the plan and demonstrated an understanding of the instructions.   The patient was advised to call back or seek an in-person evaluation if the symptoms worsen or if the condition fails to improve as anticipated.    Peggye Pitt  Loreta Ave, MD  [facility] at [facility]

## 2020-09-19 ENCOUNTER — Other Ambulatory Visit: Payer: Self-pay | Admitting: Internal Medicine

## 2020-09-19 DIAGNOSIS — G479 Sleep disorder, unspecified: Secondary | ICD-10-CM

## 2020-09-20 ENCOUNTER — Encounter: Payer: Self-pay | Admitting: Internal Medicine

## 2020-09-20 DIAGNOSIS — F900 Attention-deficit hyperactivity disorder, predominantly inattentive type: Secondary | ICD-10-CM

## 2020-09-20 DIAGNOSIS — G479 Sleep disorder, unspecified: Secondary | ICD-10-CM

## 2020-09-21 MED ORDER — AMPHETAMINE-DEXTROAMPHETAMINE 20 MG PO TABS: ORAL_TABLET | ORAL | 0 refills | Status: DC

## 2020-09-21 MED ORDER — AMPHETAMINE-DEXTROAMPHET ER 30 MG PO CP24: 30.0000 mg | ORAL_CAPSULE | ORAL | 0 refills | Status: DC

## 2020-09-21 MED ORDER — AMPHETAMINE-DEXTROAMPHET ER 30 MG PO CP24
30.0000 mg | ORAL_CAPSULE | ORAL | 0 refills | Status: DC
Start: 2020-09-21 — End: 2020-11-29

## 2020-10-19 ENCOUNTER — Other Ambulatory Visit: Payer: Self-pay | Admitting: Internal Medicine

## 2020-10-19 DIAGNOSIS — F339 Major depressive disorder, recurrent, unspecified: Secondary | ICD-10-CM

## 2020-11-09 DIAGNOSIS — S0990XA Unspecified injury of head, initial encounter: Secondary | ICD-10-CM | POA: Diagnosis not present

## 2020-11-09 DIAGNOSIS — Z23 Encounter for immunization: Secondary | ICD-10-CM | POA: Diagnosis not present

## 2020-11-09 DIAGNOSIS — S199XXA Unspecified injury of neck, initial encounter: Secondary | ICD-10-CM | POA: Diagnosis not present

## 2020-11-09 DIAGNOSIS — S01111A Laceration without foreign body of right eyelid and periocular area, initial encounter: Secondary | ICD-10-CM | POA: Diagnosis not present

## 2020-11-09 DIAGNOSIS — I10 Essential (primary) hypertension: Secondary | ICD-10-CM | POA: Diagnosis not present

## 2020-11-09 DIAGNOSIS — S0181XA Laceration without foreign body of other part of head, initial encounter: Secondary | ICD-10-CM | POA: Diagnosis not present

## 2020-11-15 ENCOUNTER — Ambulatory Visit: Payer: Self-pay

## 2020-11-15 ENCOUNTER — Ambulatory Visit: Payer: Medicare PPO | Admitting: Physician Assistant

## 2020-11-15 ENCOUNTER — Encounter: Payer: Self-pay | Admitting: Physician Assistant

## 2020-11-15 DIAGNOSIS — M5442 Lumbago with sciatica, left side: Secondary | ICD-10-CM

## 2020-11-15 MED ORDER — HYDROCODONE-ACETAMINOPHEN 5-325 MG PO TABS
1.0000 | ORAL_TABLET | Freq: Four times a day (QID) | ORAL | 0 refills | Status: DC | PRN
Start: 2020-11-15 — End: 2020-11-20

## 2020-11-15 MED ORDER — HYDROCODONE-ACETAMINOPHEN 5-325 MG PO TABS: 1.0000 | ORAL_TABLET | Freq: Four times a day (QID) | ORAL | 0 refills | Status: DC | PRN

## 2020-11-15 MED ORDER — METHYLPREDNISOLONE 4 MG PO TABS: ORAL_TABLET | ORAL | 0 refills | Status: DC

## 2020-11-15 MED ORDER — TIZANIDINE HCL 2 MG PO CAPS
2.0000 mg | ORAL_CAPSULE | Freq: Every day | ORAL | 0 refills | Status: DC
Start: 2020-11-15 — End: 2020-11-16

## 2020-11-15 NOTE — Progress Notes (Signed)
Office Visit Note   Patient: Leslie [name]           Date of Birth: 26-Nov-1950           MRN: [id number] Visit Date: 11/15/2020              Requested by: Leslie [name], Leslie Patricia, MD [address] [place],  [place] [address] PCP: Leslie [name], Leslie Patricia, MD   Assessment & Plan: Visit Diagnoses:  1. Left-sided low back pain with left-sided sciatica, unspecified chronicity     Plan: Patient is unable to take NSAIDs therefore place her on Medrol Dosepak, she is given Norco to use sparingly and Zanaflex which she is tolerating well in the past.  She has home exercises from prior physical therapy for her back if she needs a refresher and needs to let us know.  She will follow-up with [place] in 4 weeks or edition versus sooner.  Questions were encouraged and answered at length  Follow-Up Instructions: Return in about 4 weeks (around 12/13/2020).   Orders:  Orders Placed This Encounter  Procedures  . XR Lumbar Spine 2-3 Views   Meds ordered this encounter  Medications  . methylPREDNISolone (MEDROL) 4 MG tablet    Sig: Take as directed    Dispense:  21 tablet    Refill:  0  . DISCONTD: HYDROcodone-acetaminophen (NORCO) 5-325 MG tablet    Sig: Take 1 tablet by mouth every 6 (six) hours as needed for moderate pain.    Dispense:  30 tablet    Refill:  0  . tizanidine (ZANAFLEX) 2 MG capsule    Sig: Take 1 capsule (2 mg total) by mouth at bedtime.    Dispense:  30 capsule    Refill:  0  . HYDROcodone-acetaminophen (NORCO) 5-325 MG tablet    Sig: Take 1 tablet by mouth every 6 (six) hours as needed for moderate pain.    Dispense:  30 tablet    Refill:  0      Procedures: No procedures performed   Clinical Data: No additional findings.   Subjective: Chief Complaint  Patient presents with  . Lower Back - Pain    HPI Leslie [name] is well-known to our department service comes in today with sharp pain from her back down into her calves bilaterally.  No  numbness tingling.  She reports [place] she lost her footing.  No loss of consciousness.  She was seen in the ER there CT of her head she reports is negative.  She is having severe back pain radiating to her legs but not into her feet.  Has been taking Tylenol for the pain.  No bowel bladder dysfunction.  No saddle anesthesia.  She is nondiabetic.  Review of Systems Negative for fevers or chills.  Objective: Vital Signs: There were no vitals taken for this visit.  Physical Exam Constitutional:      Appearance: She is not ill-appearing or diaphoretic.  Neurological:     Mental Status: She is alert.   Head: She has bruising about the right side of her face and is laceration that is been glued near her eyebrow.  Ortho Exam Lower extremities 5 /5 strength bilaterally.  Negative straight leg raise bilaterally.  She has tenderness over the lower lumbar spine bilaterally.  She has excellent forward flexion of lumbar spine limited extension lumbar spine. Left knee ecchymosis about the knee.  No instability valgus  varus stressing.  Full range of motion.  No apparent effusion, erythema or abnormal warmth. Specialty Comments:  No specialty comments available.  Imaging: 2 views lumbar spine: Compared to films obtained 02/08/2020 no significant changes.  No acute fractures.  No spondylolisthesis.  Degenerative changes L4-5 and L5-S1.  Facet changes lower lumbar spine.  Arthrosclerosis aorta noted.  PMFS History: Patient Active Problem List   Diagnosis Date Noted  . Vitamin D deficiency 02/17/2020  . Sleep disorder 07/10/2018  . ADHD (attention deficit hyperactivity disorder) 10/01/2017  . Unilateral primary osteoarthritis, right knee 08/20/2017  . Chronic pain of both shoulders 08/20/2017  . Complete tear of right rotator cuff 04/29/2017  . Osteoarthritis of left knee 06/16/2015  . Status post total left knee replacement 06/16/2015  .  SUBSTANCE ABUSE 02/06/2009  . Attention deficit disorder 09/14/2007  . COLONIC POLYPS, HX OF 08/03/2007  . Essential hypertension 01/20/2007  . OBESITY NOS 12/04/2006  . Allergic rhinitis 12/04/2006  . GASTROJEJUNOSTOMY, HX OF 12/04/2006  . BREAST BIOPSY, HX OF 12/04/2006   Past Medical History:  Diagnosis Date  . ADD (attention deficit disorder)   . Allergy   . Arthritis 2016   osteoarthritis left knee  . Colon polyps   . Hypertension   . Left knee pain    chronic  . Narcotic addiction (HCC)   . Obesity    post gastric bypass  . Vitamin D deficiency     Family History  Problem Relation Age of Onset  . Heart disease Mother        post CABG history of CHF  . COPD Father   . Diabetes Sister   . Hypertension Sister        post renal transplant  . Heart disease Brother        CAD    Past Surgical History:  Procedure Laterality Date  . arthscopic knee surgery Left    several times  . BREAST BIOPSY    . BREAST SURGERY  years ago   breast biopsy  . CHOLECYSTECTOMY    . EYE SURGERY Bilateral 2013   Toric Lens implants  . EYE SURGERY Right 2014   corneal amniotic membrane  . GASTRIC BYPASS  2003  . KIDNEY DONATION  2004  . TOTAL KNEE ARTHROPLASTY Left 06/16/2015   Procedure: LEFT TOTAL KNEE ARTHROPLASTY;  Surgeon: Kathryne Hitch, MD;  Location: [facility];  Service: Orthopedics;  Laterality: Left;   Social History   Occupational History  . Not on file  Tobacco Use  . Smoking status: Former Smoker    Quit date: 07/22/1992    Years since quitting: 28.3  . Smokeless tobacco: Never Used  Substance and Sexual Activity  . Alcohol use: Yes    Comment: once every 6 months  . Drug use: No  . Sexual activity: Not on file

## 2020-11-16 ENCOUNTER — Other Ambulatory Visit: Payer: Self-pay | Admitting: Physician Assistant

## 2020-11-16 MED ORDER — TIZANIDINE HCL 2 MG PO TABS: 2.0000 mg | ORAL_TABLET | Freq: Three times a day (TID) | ORAL | 0 refills | Status: DC

## 2020-11-20 ENCOUNTER — Other Ambulatory Visit: Payer: Self-pay | Admitting: Physician Assistant

## 2020-11-20 MED ORDER — HYDROCODONE-ACETAMINOPHEN 5-325 MG PO TABS: 1.0000 | ORAL_TABLET | Freq: Four times a day (QID) | ORAL | 0 refills | Status: DC | PRN

## 2020-11-24 ENCOUNTER — Other Ambulatory Visit: Payer: Self-pay | Admitting: Orthopaedic Surgery

## 2020-11-24 ENCOUNTER — Encounter: Payer: Self-pay | Admitting: Internal Medicine

## 2020-11-24 DIAGNOSIS — F900 Attention-deficit hyperactivity disorder, predominantly inattentive type: Secondary | ICD-10-CM

## 2020-11-24 MED ORDER — HYDROCODONE-ACETAMINOPHEN 5-325 MG PO TABS
1.0000 | ORAL_TABLET | Freq: Four times a day (QID) | ORAL | 0 refills | Status: DC | PRN
Start: 2020-11-24 — End: 2020-12-01

## 2020-11-29 MED ORDER — AMPHETAMINE-DEXTROAMPHETAMINE 20 MG PO TABS: ORAL_TABLET | ORAL | 0 refills | Status: DC

## 2020-11-29 MED ORDER — AMPHETAMINE-DEXTROAMPHET ER 30 MG PO CP24: 30.0000 mg | ORAL_CAPSULE | ORAL | 0 refills | Status: DC

## 2020-12-01 ENCOUNTER — Other Ambulatory Visit: Payer: Self-pay | Admitting: Physician Assistant

## 2020-12-01 MED ORDER — ACETAMINOPHEN-CODEINE #3 300-30 MG PO TABS: 1.0000 | ORAL_TABLET | ORAL | 0 refills | Status: DC | PRN

## 2020-12-01 NOTE — Telephone Encounter (Signed)
Please advise 

## 2020-12-06 NOTE — Telephone Encounter (Signed)
Please advise 

## 2020-12-07 ENCOUNTER — Other Ambulatory Visit: Payer: Self-pay

## 2020-12-07 DIAGNOSIS — M5442 Lumbago with sciatica, left side: Secondary | ICD-10-CM

## 2020-12-11 ENCOUNTER — Other Ambulatory Visit: Payer: Self-pay | Admitting: Physician Assistant

## 2020-12-13 ENCOUNTER — Other Ambulatory Visit: Payer: Self-pay | Admitting: Internal Medicine

## 2020-12-13 DIAGNOSIS — I10 Essential (primary) hypertension: Secondary | ICD-10-CM

## 2020-12-13 DIAGNOSIS — F339 Major depressive disorder, recurrent, unspecified: Secondary | ICD-10-CM

## 2020-12-19 ENCOUNTER — Other Ambulatory Visit: Payer: Self-pay | Admitting: Internal Medicine

## 2020-12-19 DIAGNOSIS — G479 Sleep disorder, unspecified: Secondary | ICD-10-CM

## 2020-12-20 ENCOUNTER — Encounter: Payer: Self-pay | Admitting: Internal Medicine

## 2020-12-20 DIAGNOSIS — G479 Sleep disorder, unspecified: Secondary | ICD-10-CM

## 2020-12-20 MED ORDER — ALPRAZOLAM 0.25 MG PO TABS: ORAL_TABLET | ORAL | 0 refills | Status: DC

## 2020-12-20 NOTE — Telephone Encounter (Signed)
Rx sent 

## 2020-12-25 ENCOUNTER — Encounter: Payer: Self-pay | Admitting: Physical Therapy

## 2020-12-25 ENCOUNTER — Ambulatory Visit (INDEPENDENT_AMBULATORY_CARE_PROVIDER_SITE_OTHER): Payer: Medicare PPO | Admitting: Physical Therapy

## 2020-12-25 ENCOUNTER — Other Ambulatory Visit: Payer: Self-pay

## 2020-12-25 DIAGNOSIS — G8929 Other chronic pain: Secondary | ICD-10-CM | POA: Diagnosis not present

## 2020-12-25 DIAGNOSIS — M5441 Lumbago with sciatica, right side: Secondary | ICD-10-CM | POA: Diagnosis not present

## 2020-12-25 DIAGNOSIS — M5442 Lumbago with sciatica, left side: Secondary | ICD-10-CM | POA: Diagnosis not present

## 2020-12-25 DIAGNOSIS — M6281 Muscle weakness (generalized): Secondary | ICD-10-CM

## 2020-12-25 DIAGNOSIS — R2689 Other abnormalities of gait and mobility: Secondary | ICD-10-CM | POA: Diagnosis not present

## 2020-12-25 NOTE — Therapy (Signed)
[facility] Physical Therapy [address] [place], [place], [address] Phone: [phone]   Fax:  [phone]  Physical Therapy Evaluation  Referring diagnosis? M54.42 Treatment diagnosis? (if different than referring diagnosis) same What was this (referring dx) caused by? []  Surgery [x]  Fall [x]  Ongoing issue [x]  Arthritis []  Other: ____________  Laterality: []  Rt []  Lt [x]  Both  Check all possible CPT codes:      [x]  97110 (Therapeutic Exercise)  []  92507 (SLP Treatment)  [x]  97112 (Neuro Re-ed)   []  92526 (Swallowing Treatment)   [x]  97116 (Gait Training)   []  (Cognitive Training, 1st 15 minutes) [x]  97140 (Manual Therapy)   []  97130 (Cognitive Training, each add'l 15 minutes)  [x]  97530 (Therapeutic Activities)  []  Other, List CPT Code ____________    [x]  97535 (Self Care)       []  All codes above (97110 - 97535)  [x]  97012 (Mechanical Traction)  [x]  97014 (E-stim Unattended)  []  97032 (E-stim manual)  []  (Ionto)  [x]  97035 (Ultrasound)  []  97760 (Orthotic Fit) []  (Physical Performance Training) []  (Aquatic Therapy) []  97034 (Contrast Bath) []  97018 (Paraffin) []  97597 (Wound Care 1st 20 sq cm) []  97598 (Wound Care each add'l 20 sq cm) []  97016 (Vasopneumatic Device) []  (Orthotic Training) []  (Prosthetic Training)   Patient Details  Name: Leslie [name] MRN: Date of Birth: 1950/12/09 Referring Provider (PT):   Encounter Date: 12/25/2020   PT End of Session - 12/25/20 1504    Visit Number 1    Number of Visits 12    Date for PT Re-Evaluation 02/19/21    Authorization Type [employer]    PT Start Time 1345    PT Stop Time 1435    PT Time Calculation (min) 50 min    Activity Tolerance Patient tolerated treatment well    Behavior During Therapy [facility] for tasks assessed/performed           Past Medical History:  Diagnosis Date  . ADD (attention deficit disorder)   . Allergy    . Arthritis 2016   osteoarthritis left knee  . Colon polyps   . Hypertension   . Left knee pain    chronic  . Narcotic addiction (HCC)   . Obesity    post gastric bypass  . Vitamin D deficiency     Past Surgical History:  Procedure Laterality Date  . arthscopic knee surgery Left    several times  . BREAST BIOPSY    . BREAST SURGERY  years ago   breast biopsy  . CHOLECYSTECTOMY    . EYE SURGERY Bilateral 2013   Toric Lens implants  . EYE SURGERY Right 2014   corneal amniotic membrane  . GASTRIC BYPASS  2003  . KIDNEY DONATION  2004  . TOTAL KNEE ARTHROPLASTY Left 06/16/2015   Procedure: LEFT TOTAL KNEE ARTHROPLASTY;  Surgeon: , MD;  Location: [facility];  Service: Orthopedics;  Laterality: Left;    There were no vitals filed for this visit.    Subjective Assessment - 12/25/20 1351    Subjective She relays low back pain with bilateral sciatica since April 2021. She says she cant take NSAIDs due to only having one kidney. She has had Lt TKA and says her Rt knee is also bone on bone and her Lt leg feels longer and she gets off balance. She also relays she lost her wife recently to cancer so she is stressed and depressed  with that which might be contributing to her pain. She is seeing counsler for this but she still lacks motivation to do things. Her back pain is way worse first thing in the morning.    Pertinent History IRW:ERXVQ disorder, Lt TKA,obesity,RTC tear    Limitations Lifting;Standing;Walking;House hold activities    How long can you stand comfortably? 30 minutes    How long can you walk comfortably? 20 minutes at best    Diagnostic tests Imaging "2 views lumbar spine: Compared to films obtained 02/08/2020 no significant changes.  No acute fractures.  No spondylolisthesis.  Degenerative changes L4-5 and L5-S1.  Facet changes lower lumbar spine.  Arthrosclerosis aorta noted    Currently in Pain? Yes    Pain Score 5    5-9 pain today   Pain Location  Back    Pain Orientation Right;Left    Pain Descriptors / Indicators Aching;Throbbing    Pain Type Chronic pain    Pain Radiating Towards down both legs    Pain Onset More than a month ago    Pain Frequency Constant    Aggravating Factors  standing or walking too long, sleeping    Pain Relieving Factors meds sometimes but not really, heated massager              [facility] PT Assessment - 12/25/20 0001      Assessment   Medical Diagnosis Left-sided low back pain with bilat sciatica    Referring Provider (PT) Kriste Basque    Onset Date/Surgical Date --   Chronic pain but worse since April 2022   Prior Therapy nothing recent      Precautions   Precautions Fall      Balance Screen   Has the patient fallen in the past 6 months Yes    How many times? 2    Has the patient had a decrease in activity level because of a fear of falling?  No    Is the patient reluctant to leave their home because of a fear of falling?  No      Home [employer]ist information centre manager residence    Additional Comments 8 steps out back, 4 steps in front, has to hold onto rails and goes down one step at a time      Prior Function   Level of Independence Independent    Vocation Retired    Leisure walk, but has not done any of this or much of anything lately due to lack of motivation      Cognition   Overall Cognitive Status Within Functional Limits for tasks assessed      Observation/Other Assessments   Focus on Therapeutic Outcomes (FOTO)  47% functional intake, goal is 62%      ROM / Strength   AROM / PROM / Strength AROM;Strength      AROM   Overall AROM Comments lumbar ROM WNL      Strength   Overall Strength Comments leg strength overall WFL except hip flexion/abd 4+ tested in sitting      Flexibility   Soft Tissue Assessment /Muscle Length --   tightness in lumbar P.S, hip rotators and piriformis     Palpation   Palpation comment pain noted at L5-S1 with central PAM testing       Special Tests   Other special tests negative slump test, negative SLR test, negative quadrant test      Ambulation/Gait   Gait Comments antalgic gait noted, decreased overall  dynamic balance                      Objective measurements completed on examination: See above findings.       [facility] Adult PT Treatment/Exercise - 12/25/20 0001      Modalities   Modalities Moist Heat;Electrical Stimulation      Moist Heat Therapy   Number Minutes Moist Heat 12 Minutes    Moist Heat Location Lumbar Spine      Electrical Stimulation   Electrical Stimulation Location lumbar    Electrical Stimulation Action IFC    Electrical Stimulation Parameters to tolerance with heat in sitting X 12 min    Electrical Stimulation Goals Pain                  PT Education - 12/25/20 1502    Education Details HEP, pain science and need for gentle movment and graded activity to improve pain and function and the importance of social interaction, hobbies, and doing things she enjoys    Person(s) Educated Patient    Methods Explanation;Demonstration;Verbal cues;Handout    Comprehension Verbalized understanding;Need further instruction            PT Short Term Goals - 12/25/20 1615      PT SHORT TERM GOAL #1   Title be independent in initial HEP    Time 4    Period Weeks    Status New    Target Date 01/22/21      PT SHORT TERM GOAL #2   Title -             PT Long Term Goals - 12/25/20 1615      PT LONG TERM GOAL #1   Title She will improve FOTO to at least 62% functional score    Time 8    Period Weeks    Status New    Target Date 02/19/21      PT LONG TERM GOAL #2   Title She will reduce pain to overall less than 3/10 with usual activity    Time 8    Period Weeks    Status New    Target Date 02/19/21      PT LONG TERM GOAL #3   Title balance goal to be written after additional testing in upcoming visits.      PT LONG TERM GOAL #4   Title -      PT LONG  TERM GOAL #5   Title -                  Plan - 12/25/20 1610    Clinical Impression Statement She presents with chronic low back pain with bilateral sciatica that is likely exacerbated by recent period of grief from the death of her wife along with now sedentary lifestyle without motivation to things. We discussed the need for gradual activity progression, social interaction, exercise, and doing things she enjoys. She is also fearful of falling and feels off balance. She will benefit from skilled PT to address her functional deficits listed below and to improve her overall function and balance.    Personal Factors and Comorbidities Comorbidity 3+    Comorbidities FFM:BWGYK disorder, Lt TKA,obesity,RTC tear, narcotic dependance.    Examination-Activity Limitations Bend;Lift;Locomotion Level;Carry;Sleep;Stairs;Stand;Transfers    Examination-Participation Restrictions Cleaning;Community Activity;Driving;Shop;Laundry    Stability/Clinical Decision Making Evolving/Moderate complexity    Clinical Decision Making Moderate    Rehab Potential Good    PT Frequency 2x /  week   1-2   PT Duration 8 weeks    PT Next Visit Plan perform BERG balance and or TUG to futher assess balance and write goal for this. review and update HEP PRN, gradual activity progression and pain science education    PT Home Exercise Plan Access Code: [email or web address]    Consulted and Agree with Plan of Care Patient           Patient will benefit from skilled therapeutic intervention in order to improve the following deficits and impairments:  Decreased activity tolerance,Abnormal gait,Decreased balance,Decreased endurance,Decreased strength,Difficulty walking,Increased muscle spasms,Postural dysfunction,Improper body mechanics,Pain,Obesity  Visit Diagnosis: Chronic bilateral low back pain with bilateral sciatica  Muscle weakness (generalized)  Other abnormalities of gait and mobility     Problem List Patient Active  Problem List   Diagnosis Date Noted  . Vitamin D deficiency 02/17/2020  . Sleep disorder 07/10/2018  . ADHD (attention deficit hyperactivity disorder) 10/01/2017  . Unilateral primary osteoarthritis, right knee 08/20/2017  . Chronic pain of both shoulders 08/20/2017  . Complete tear of right rotator cuff 04/29/2017  . Osteoarthritis of left knee 06/16/2015  . Status post total left knee replacement 06/16/2015  . SUBSTANCE ABUSE 02/06/2009  . Attention deficit disorder 09/14/2007  . COLONIC POLYPS, HX OF 08/03/2007  . Essential hypertension 01/20/2007  . OBESITY NOS 12/04/2006  . Allergic rhinitis 12/04/2006  . GASTROJEJUNOSTOMY, HX OF 12/04/2006  . BREAST BIOPSY, HX OF 12/04/2006    Birdie RiddleBrian R Prim Morace,PT,DPT 12/25/2020, 4:20 PM  [facility] [address] [place], [place], [address] Phone: [phone]   Fax:  [phone]  Name: Caro HightKaylene W Kreps MRN: [id number] Date of Birth: Jun 08, 1951

## 2020-12-25 NOTE — Patient Instructions (Signed)
Access Code: [email or web address] URL: [email or web address] Date: 12/25/2020 Prepared by: Ivery Quale  Exercises Supine Lower Trunk Rotation - 2 x daily - 6 x weekly - 1 sets - 10 reps - 5 sec hold Supine March - 2 x daily - 6 x weekly - 1-2 sets - 10 reps Beginner Bridge - 2 x daily - 6 x weekly - 3 sets - 10 reps Supine Figure 4 Piriformis Stretch - 2 x daily - 6 x weekly - 1 sets - 3 reps - 30 hold Standing Row with Anchored Resistance - 2 x daily - 6 x weekly - 2-3 sets - 10-20 reps Shoulder extension with resistance - Neutral - 2 x daily - 6 x weekly - 2-3 sets - 10 reps Standing Tandem Balance with Counter Support - 2 x daily - 6 x weekly - 1 sets - 3 reps - 30 hold

## 2020-12-26 ENCOUNTER — Other Ambulatory Visit: Payer: Self-pay | Admitting: Orthopaedic Surgery

## 2020-12-26 MED ORDER — TRAMADOL HCL 50 MG PO TABS: 100.0000 mg | ORAL_TABLET | Freq: Four times a day (QID) | ORAL | 0 refills | Status: DC | PRN

## 2020-12-27 ENCOUNTER — Encounter: Payer: Self-pay | Admitting: Rehabilitative and Restorative Service Providers"

## 2020-12-27 ENCOUNTER — Other Ambulatory Visit: Payer: Self-pay

## 2020-12-27 ENCOUNTER — Ambulatory Visit: Payer: Medicare PPO | Admitting: Rehabilitative and Restorative Service Providers"

## 2020-12-27 DIAGNOSIS — R293 Abnormal posture: Secondary | ICD-10-CM | POA: Diagnosis not present

## 2020-12-27 DIAGNOSIS — G8929 Other chronic pain: Secondary | ICD-10-CM

## 2020-12-27 DIAGNOSIS — M5442 Lumbago with sciatica, left side: Secondary | ICD-10-CM | POA: Diagnosis not present

## 2020-12-27 DIAGNOSIS — M6281 Muscle weakness (generalized): Secondary | ICD-10-CM

## 2020-12-27 DIAGNOSIS — M5441 Lumbago with sciatica, right side: Secondary | ICD-10-CM

## 2020-12-27 DIAGNOSIS — R262 Difficulty in walking, not elsewhere classified: Secondary | ICD-10-CM

## 2020-12-27 NOTE — Therapy (Signed)
[facility] Physical Therapy [address] [place], [place], [address] Phone: [phone]   Fax:  [phone]  Physical Therapy Treatment  Patient Details  Name: Leslie [name] MRN: [id number] Date of Birth: October 09, 1950 Referring Provider (PT): Kriste Basque   Encounter Date: 12/27/2020   PT End of Session - 12/27/20 1656    Visit Number 2    Number of Visits 12    Date for PT Re-Evaluation 02/19/21    Authorization Type [employer]    PT Start Time 1345    PT Stop Time 1430    PT Time Calculation (min) 45 min    Activity Tolerance Patient tolerated treatment well;No increased pain    Behavior During Therapy WFL for tasks assessed/performed           Past Medical History:  Diagnosis Date  . ADD (attention deficit disorder)   . Allergy   . Arthritis 2016   osteoarthritis left knee  . Colon polyps   . Hypertension   . Left knee pain    chronic  . Narcotic addiction (HCC)   . Obesity    post gastric bypass  . Vitamin D deficiency     Past Surgical History:  Procedure Laterality Date  . arthscopic knee surgery Left    several times  . BREAST BIOPSY    . BREAST SURGERY  years ago   breast biopsy  . CHOLECYSTECTOMY    . EYE SURGERY Bilateral 2013   Toric Lens implants  . EYE SURGERY Right 2014   corneal amniotic membrane  . GASTRIC BYPASS  2003  . KIDNEY DONATION  2004  . TOTAL KNEE ARTHROPLASTY Left 06/16/2015   Procedure: LEFT TOTAL KNEE ARTHROPLASTY;  Surgeon: Kathryne Hitch, MD;  Location: [facility];  Service: Orthopedics;  Laterality: Left;    There were no vitals filed for this visit.   Subjective Assessment - 12/27/20 1652    Subjective Leslie [name] reports participation with her starter HEP.    Pertinent History QJF:HLKTG disorder, Lt TKA,obesity,RTC tear    Limitations Lifting;Standing;Walking;House hold activities    How long can you stand comfortably? 30 minutes    How long can you walk comfortably? 20 minutes at best     Diagnostic tests Imaging "2 views lumbar spine: Compared to films obtained 02/08/2020 no significant changes.  No acute fractures.  No spondylolisthesis.  Degenerative changes L4-5 and L5-S1.  Facet changes lower lumbar spine.  Arthrosclerosis aorta noted    Currently in Pain? Yes    Pain Score 5     Pain Location Back    Pain Orientation Lower    Pain Descriptors / Indicators Aching;Throbbing    Pain Type Chronic pain    Pain Radiating Towards To B knees    Pain Onset More than a month ago    Pain Frequency Constant    Aggravating Factors  Worst in the mornings    Pain Relieving Factors NA    Effect of Pain on Daily Activities Slow to get going in the AM and has to pace herself during the day    Multiple Pain Sites No                             [facility] Adult PT Treatment/Exercise - 12/27/20 0001      Posture/Postural Control   Posture/Postural Control Postural limitations    Postural Limitations Forward head;Rounded Shoulders;Decreased lumbar lordosis      Balance  Balance Assessed --   Next visit     Therapeutic Activites    Therapeutic Activities ADL's;Lifting    ADL's Log roll, tie shoes, avoiding bending and twisting    Lifting Golfer's lift      Exercises   Exercises Lumbar      Lumbar Exercises: Stretches   Lower Trunk Rotation Other (comment)   10X 5 seconds B   Figure 4 Stretch 5 reps;20 seconds      Lumbar Exercises: Standing   Scapular Retraction Strengthening;Both;10 reps;Limitations    Scapular Retraction Limitations 5 seconds    Row Strengthening;Both;20 reps;Theraband    Theraband Level (Row) Level 4 (Blue)    Row Limitations 3 seconds    Shoulder Extension Strengthening;Both;10 reps;Theraband;Limitations    Theraband Level (Shoulder Extension) Level 2 (Red)    Shoulder Extension Limitations 3 seconds    Other Standing Lumbar Exercises Trunk extension AROM 2 sets of 10X 3 seconds      Lumbar Exercises: Supine   Bridge Non-compliant;10  reps;5 seconds                  PT Education - 12/27/20 1654    Education Details Either ran through or discussed starter HEP, discussed walking program, body mechanics and changes/progressions to program today.    Person(s) Educated Patient    Methods Explanation;Demonstration;Tactile cues;Verbal cues;Handout    Comprehension Verbal cues required;Returned demonstration;Need further instruction;Tactile cues required;Verbalized understanding            PT Short Term Goals - 12/27/20 1655      PT SHORT TERM GOAL #1   Title be independent in initial HEP    Time 4    Period Weeks    Status On-going    Target Date 01/22/21      PT SHORT TERM GOAL #2   Title -             PT Long Term Goals - 12/27/20 1655      PT LONG TERM GOAL #1   Title She will improve FOTO to at least 62% functional score    Time 8    Period Weeks    Status On-going      PT LONG TERM GOAL #2   Title She will reduce pain to overall less than 3/10 with usual activity    Time 8    Period Weeks    Status On-going      PT LONG TERM GOAL #3   Title balance goal to be written after additional testing in upcoming visits.    Status On-going      PT LONG TERM GOAL #4   Title -      PT LONG TERM GOAL #5   Title -                 Plan - 12/27/20 1656    Clinical Impression Statement Leslie [name] gave good effort with her program today and had good questions about anatomy and body mechanics.  Encouraged a walking program and consistent HEP compliance.  Prognosis is good with current effort and participation.    Personal Factors and Comorbidities Comorbidity 3+    Comorbidities UJW:JXBJY disorder, Lt TKA,obesity,RTC tear, narcotic dependance.    Examination-Activity Limitations Bend;Lift;Locomotion Level;Carry;Sleep;Stairs;Stand;Transfers    Examination-Participation Restrictions Cleaning;Community Activity;Driving;Shop;Laundry    Stability/Clinical Decision Making Evolving/Moderate  complexity    Rehab Potential Good    PT Frequency 2x / week   1-2   PT Duration 8 weeks  PT Treatment/Interventions ADLs/Self Care Home Management;Therapeutic activities;Therapeutic exercise;Neuromuscular re-education;Patient/family education;Manual techniques    PT Next Visit Plan Berg, practical body mechanics and spine strength/leg strength progressions    PT Home Exercise Plan Access Code: [email or web address]    Consulted and Agree with Plan of Care Patient           Patient will benefit from skilled therapeutic intervention in order to improve the following deficits and impairments:  Decreased activity tolerance,Abnormal gait,Decreased balance,Decreased endurance,Decreased strength,Difficulty walking,Increased muscle spasms,Postural dysfunction,Improper body mechanics,Pain,Obesity  Visit Diagnosis: Abnormal posture  Difficulty walking  Chronic bilateral low back pain with bilateral sciatica  Muscle weakness (generalized)     Problem List Patient Active Problem List   Diagnosis Date Noted  . Vitamin D deficiency 02/17/2020  . Sleep disorder 07/10/2018  . ADHD (attention deficit hyperactivity disorder) 10/01/2017  . Unilateral primary osteoarthritis, right knee 08/20/2017  . Chronic pain of both shoulders 08/20/2017  . Complete tear of right rotator cuff 04/29/2017  . Osteoarthritis of left knee 06/16/2015  . Status post total left knee replacement 06/16/2015  . SUBSTANCE ABUSE 02/06/2009  . Attention deficit disorder 09/14/2007  . COLONIC POLYPS, HX OF 08/03/2007  . Essential hypertension 01/20/2007  . OBESITY NOS 12/04/2006  . Allergic rhinitis 12/04/2006  . GASTROJEJUNOSTOMY, HX OF 12/04/2006  . BREAST BIOPSY, HX OF 12/04/2006    Cherlyn Cushing PT, MPT 12/27/2020, 4:59 PM  [facility] Physical Therapy [address] [place], [place], [address] Phone: [phone]   Fax:  [phone]  Name: Leslie [name] MRN: [id number] Date of Birth:  1950/09/25

## 2021-01-01 ENCOUNTER — Encounter: Payer: Medicare PPO | Admitting: Physical Therapy

## 2021-01-03 ENCOUNTER — Encounter: Payer: Medicare PPO | Admitting: Physical Therapy

## 2021-01-11 ENCOUNTER — Encounter: Payer: Medicare PPO | Admitting: Rehabilitative and Restorative Service Providers"

## 2021-01-11 ENCOUNTER — Encounter: Payer: Medicare PPO | Admitting: Physical Therapy

## 2021-01-12 ENCOUNTER — Ambulatory Visit: Payer: Medicare PPO | Admitting: Rehabilitative and Restorative Service Providers"

## 2021-01-12 ENCOUNTER — Encounter: Payer: Medicare PPO | Admitting: Physical Therapy

## 2021-01-12 ENCOUNTER — Encounter: Payer: Self-pay | Admitting: Rehabilitative and Restorative Service Providers"

## 2021-01-12 ENCOUNTER — Other Ambulatory Visit: Payer: Self-pay

## 2021-01-12 DIAGNOSIS — R293 Abnormal posture: Secondary | ICD-10-CM | POA: Diagnosis not present

## 2021-01-12 DIAGNOSIS — M6281 Muscle weakness (generalized): Secondary | ICD-10-CM

## 2021-01-12 DIAGNOSIS — M5442 Lumbago with sciatica, left side: Secondary | ICD-10-CM | POA: Diagnosis not present

## 2021-01-12 DIAGNOSIS — R262 Difficulty in walking, not elsewhere classified: Secondary | ICD-10-CM

## 2021-01-12 DIAGNOSIS — M5441 Lumbago with sciatica, right side: Secondary | ICD-10-CM | POA: Diagnosis not present

## 2021-01-12 DIAGNOSIS — G8929 Other chronic pain: Secondary | ICD-10-CM | POA: Diagnosis not present

## 2021-01-12 NOTE — Therapy (Signed)
[facility] Physical Therapy [address] [place], [place], [address] Phone: [phone]   Fax:  [phone]  Physical Therapy Treatment  Patient Details  Name: LAINI URICK MRN: [id number] Date of Birth: 12-05-1950 Referring Provider (PT): Kriste Basque   Encounter Date: 01/12/2021   PT End of Session - 01/12/21 1649     Visit Number 3    Number of Visits 12    Date for PT Re-Evaluation 02/19/21    Authorization Type [employer]    PT Start Time 1345    PT Stop Time 1430    PT Time Calculation (min) 45 min    Activity Tolerance Patient tolerated treatment well;No increased pain;Patient limited by fatigue    Behavior During Therapy [facility] for tasks assessed/performed             Past Medical History:  Diagnosis Date   ADD (attention deficit disorder)    Allergy    Arthritis 2016   osteoarthritis left knee   Colon polyps    Hypertension    Left knee pain    chronic   Narcotic addiction (HCC)    Obesity    post gastric bypass   Vitamin D deficiency     Past Surgical History:  Procedure Laterality Date   arthscopic knee surgery Left    several times   BREAST BIOPSY     BREAST SURGERY  years ago   breast biopsy   CHOLECYSTECTOMY     EYE SURGERY Bilateral 2013   Toric Lens implants   EYE SURGERY Right 2014   corneal amniotic membrane   GASTRIC BYPASS  2003   KIDNEY DONATION  2004   TOTAL KNEE ARTHROPLASTY Left 06/16/2015   Procedure: LEFT TOTAL KNEE ARTHROPLASTY;  Surgeon: Kathryne Hitch, MD;  Location: [facility];  Service: Orthopedics;  Laterality: Left;    There were no vitals filed for this visit.   Subjective Assessment - 01/12/21 1353     Subjective Deniss reports overdoing walking while at [employer].  She is interested in getting stronger so she can have better WB endurance.    Pertinent History FYB:OFBPZ disorder, Lt TKA, obesity, RTC tear    Limitations Lifting;Standing;Walking;House hold activities    How long can you  stand comfortably? 15-20 minutes    How long can you walk comfortably? 5-10 minutes    Diagnostic tests Imaging "2 views lumbar spine: Compared to films obtained 02/08/2020 no significant changes.  No acute fractures.  No spondylolisthesis.  Degenerative changes L4-5 and L5-S1.  Facet changes lower lumbar spine.  Arthrosclerosis aorta noted    Currently in Pain? Yes    Pain Score 3     Pain Location Back    Pain Orientation Lower    Pain Descriptors / Indicators Aching    Pain Type Chronic pain    Pain Radiating Towards To B knees    Pain Onset More than a month ago    Pain Frequency Constant    Aggravating Factors  Worst in the mornings and with prolonged postures (particularly walking)    Pain Relieving Factors Rest    Effect of Pain on Daily Activities Needs to take frequent rest breaks and pace herself, poor endurance to activity, needed a wheelchair at [facility]    Multiple Pain Sites No                               [facility] Adult PT Treatment/Exercise - 01/12/21  0001       Posture/Postural Control   Posture/Postural Control Postural limitations    Postural Limitations Forward head;Rounded Shoulders;Decreased lumbar lordosis      Balance   Balance Assessed --   Next visit     Therapeutic Activites    Therapeutic Activities Other Therapeutic Activities    Other Therapeutic Activities Reviewed exercises, offered correction and made progressions/recommendations for priorities at home      Exercises   Exercises Lumbar      Lumbar Exercises: Stretches   Lower Trunk Rotation Other (comment)   10X 5 seconds B   Figure 4 Stretch 5 reps;20 seconds      Lumbar Exercises: Standing   Scapular Retraction Strengthening;Both;10 reps;Limitations    Scapular Retraction Limitations 5 seconds    Row Strengthening;Both;20 reps;Theraband    Theraband Level (Row) Level 4 (Blue)    Row Limitations 3 seconds    Shoulder Extension Strengthening;Both;10  reps;Theraband;Limitations    Theraband Level (Shoulder Extension) Level 2 (Red)    Shoulder Extension Limitations 3 seconds    Other Standing Lumbar Exercises Trunk extension AROM 2 sets of 10X 3 seconds    Other Standing Lumbar Exercises Alternating hip hike 5 sets of 4 for 3 seconds      Lumbar Exercises: Supine   Bridge --      Lumbar Exercises: Prone   Straight Leg Raise 10 reps;3 seconds;Limitations    Straight Leg Raises Limitations 2 sets prone alternating hip extension with ankles in dorsiflexion                    PT Education - 01/12/21 1648     Education Details Progressed spine and postural strengthening today and reviewed HEP.    Person(s) Educated Patient    Methods Explanation;Demonstration;Tactile cues;Verbal cues;Handout    Comprehension Verbal cues required;Returned demonstration;Need further instruction;Verbalized understanding;Tactile cues required              PT Short Term Goals - 01/12/21 1648       PT SHORT TERM GOAL #1   Title be independent in initial HEP    Time 4    Period Weeks    Status On-going    Target Date 01/22/21      PT SHORT TERM GOAL #2   Title -               PT Long Term Goals - 01/12/21 1648       PT LONG TERM GOAL #1   Title She will improve FOTO to at least 62% functional score    Time 8    Period Weeks    Status On-going      PT LONG TERM GOAL #2   Title She will reduce pain to overall less than 3/10 with usual activity    Time 8    Period Weeks    Status On-going      PT LONG TERM GOAL #3   Title balance goal to be written after additional testing in upcoming visits.    Status On-going      PT LONG TERM GOAL #4   Title -      PT LONG TERM GOAL #5   Title -                   Plan - 01/12/21 1649     Clinical Impression Statement Emelie is back from her [place] trip.  The prolonged standing and walking were a challenge and  she had to rely on a wheelchair to get around the park.   Core weakness is noted with new activities and core strengthening was progressed with her HEP.  Emelie will benefit from continued HEP compliance and more consistent clinic attendance given significant weakness and deconditioning.  RA next week with FOTO update.    Personal Factors and Comorbidities Comorbidity 3+    Comorbidities GUY:QIHKV disorder, Lt TKA,obesity,RTC tear, narcotic dependance.    Examination-Activity Limitations Bend;Lift;Locomotion Level;Carry;Sleep;Stairs;Stand;Transfers    Examination-Participation Restrictions Cleaning;Community Activity;Driving;Shop;Laundry    Stability/Clinical Decision Making Evolving/Moderate complexity    Rehab Potential Good    PT Frequency 2x / week   1-2   PT Duration 8 weeks    PT Treatment/Interventions ADLs/Self Care Home Management;Therapeutic activities;Therapeutic exercise;Neuromuscular re-education;Patient/family education;Manual techniques    PT Next Visit Plan Berg, practical body mechanics and spine strength/leg strength progressions    PT Home Exercise Plan Access Code: [email or web address]    Consulted and Agree with Plan of Care Patient             Patient will benefit from skilled therapeutic intervention in order to improve the following deficits and impairments:  Decreased activity tolerance, Abnormal gait, Decreased balance, Decreased endurance, Decreased strength, Difficulty walking, Increased muscle spasms, Postural dysfunction, Improper body mechanics, Pain, Obesity  Visit Diagnosis: Abnormal posture  Muscle weakness (generalized)  Difficulty walking  Chronic bilateral low back pain with bilateral sciatica     Problem List Patient Active Problem List   Diagnosis Date Noted   Vitamin D deficiency 02/17/2020   Sleep disorder 07/10/2018   ADHD (attention deficit hyperactivity disorder) 10/01/2017   Unilateral primary osteoarthritis, right knee 08/20/2017   Chronic pain of both shoulders 08/20/2017   Complete tear of  right rotator cuff 04/29/2017   Osteoarthritis of left knee 06/16/2015   Status post total left knee replacement 06/16/2015   SUBSTANCE ABUSE 02/06/2009   Attention deficit disorder 09/14/2007   COLONIC POLYPS, HX OF 08/03/2007   Essential hypertension 01/20/2007   OBESITY NOS 12/04/2006   Allergic rhinitis 12/04/2006   GASTROJEJUNOSTOMY, HX OF 12/04/2006   BREAST BIOPSY, HX OF 12/04/2006    Cherlyn Cushing PT, MPT 01/12/2021, 4:52 PM  [facility] Physical Therapy [address] [place], [place], [address] Phone: [phone]   Fax:  [phone]  Name: KASHINA MECUM MRN: [id number] Date of Birth: 10-Aug-1950

## 2021-01-12 NOTE — Patient Instructions (Signed)
Access Code: [email or web address] URL: [email or web address] Date: 01/12/2021 Prepared by: Pauletta Browns  Exercises Supine Lower Trunk Rotation - 2 x daily - 6 x weekly - 1 sets - 10 reps - 5 sec hold Supine March - 2 x daily - 6 x weekly - 1-2 sets - 10 reps Beginner Bridge - 2 x daily - 6 x weekly - 3 sets - 10 reps Supine Figure 4 Piriformis Stretch - 2 x daily - 6 x weekly - 1 sets - 3 reps - 30 hold Standing Row with Anchored Resistance - 2 x daily - 6 x weekly - 2-3 sets - 10-20 reps Shoulder extension with resistance - Neutral - 2 x daily - 6 x weekly - 2-3 sets - 10 reps Standing Tandem Balance with Counter Support - 2 x daily - 6 x weekly - 1 sets - 3 reps - 30 hold Standing Lumbar Extension at Wall - Forearms - 5 x daily - 7 x weekly - 1 sets - 5 reps - 3 seconds hold Standing Scapular Retraction - 5 x daily - 7 x weekly - 1 sets - 5 reps - 5 second hold Standing Hip Hiking - 5 x daily - 7 x weekly - 1-2 sets - 2-10 reps - 3 seconds hold Prone Hip Extension - 1 x daily - 7 x weekly - 2-3 sets - 10 reps - 3 seconds hold

## 2021-01-14 ENCOUNTER — Other Ambulatory Visit: Payer: Self-pay | Admitting: Orthopaedic Surgery

## 2021-01-15 ENCOUNTER — Other Ambulatory Visit: Payer: Self-pay | Admitting: Orthopaedic Surgery

## 2021-01-16 ENCOUNTER — Other Ambulatory Visit: Payer: Self-pay | Admitting: Internal Medicine

## 2021-01-16 ENCOUNTER — Encounter: Payer: Medicare PPO | Admitting: Physical Therapy

## 2021-01-16 DIAGNOSIS — G479 Sleep disorder, unspecified: Secondary | ICD-10-CM

## 2021-01-17 ENCOUNTER — Other Ambulatory Visit: Payer: Self-pay

## 2021-01-17 ENCOUNTER — Ambulatory Visit: Payer: Medicare PPO | Admitting: Rehabilitative and Restorative Service Providers"

## 2021-01-17 ENCOUNTER — Encounter: Payer: Self-pay | Admitting: Rehabilitative and Restorative Service Providers"

## 2021-01-17 DIAGNOSIS — R2681 Unsteadiness on feet: Secondary | ICD-10-CM | POA: Diagnosis not present

## 2021-01-17 DIAGNOSIS — M5442 Lumbago with sciatica, left side: Secondary | ICD-10-CM | POA: Diagnosis not present

## 2021-01-17 DIAGNOSIS — M6281 Muscle weakness (generalized): Secondary | ICD-10-CM | POA: Diagnosis not present

## 2021-01-17 DIAGNOSIS — G8929 Other chronic pain: Secondary | ICD-10-CM | POA: Diagnosis not present

## 2021-01-17 DIAGNOSIS — R262 Difficulty in walking, not elsewhere classified: Secondary | ICD-10-CM | POA: Diagnosis not present

## 2021-01-17 DIAGNOSIS — M5441 Lumbago with sciatica, right side: Secondary | ICD-10-CM

## 2021-01-17 DIAGNOSIS — R293 Abnormal posture: Secondary | ICD-10-CM

## 2021-01-17 NOTE — Therapy (Signed)
[facility] Physical Therapy [address] [place], [place], [address] Phone: [phone]   Fax:  [phone]  Physical Therapy Treatment  Patient Details  Name: Leslie [name] MRN: [id number] Date of Birth: 05-02-1951 Referring Provider (PT): Carney Bern   Encounter Date: 01/17/2021   PT End of Session - 01/17/21 1631     Visit Number 4    Number of Visits 12    Date for PT Re-Evaluation 02/19/21    Authorization Type [employer]    PT Start Time 3748    PT Stop Time 1430    PT Time Calculation (min) 45 min    Activity Tolerance Patient tolerated treatment well;No increased pain;Patient limited by fatigue    Behavior During Therapy [facility] for tasks assessed/performed             Past Medical History:  Diagnosis Date   ADD (attention deficit disorder)    Allergy    Arthritis 2016   osteoarthritis left knee   Colon polyps    Hypertension    Left knee pain    chronic   Narcotic addiction ([place])    Obesity    post gastric bypass   Vitamin D deficiency     Past Surgical History:  Procedure Laterality Date   arthscopic knee surgery Left    several times   BREAST BIOPSY     BREAST SURGERY  years ago   breast biopsy   CHOLECYSTECTOMY     EYE SURGERY Bilateral 2013   Toric Lens implants   EYE SURGERY Right 2014   corneal amniotic membrane   GASTRIC BYPASS  2003   KIDNEY DONATION  2004   TOTAL KNEE ARTHROPLASTY Left 06/16/2015   Procedure: LEFT TOTAL KNEE ARTHROPLASTY;  Surgeon: Mcarthur Rossetti, MD;  Location: [facility];  Service: Orthopedics;  Laterality: Left;    There were no vitals filed for this visit.   Subjective Assessment - 01/17/21 1625     Subjective Minie reports good compliance with her new strength exercises.  Getting stronger with better balance and endurance is a request today.    Pertinent History OLM:BEMLJ disorder, Lt TKA, obesity, RTC tear    Limitations Lifting;Standing;Walking;House hold activities    How long  can you stand comfortably? 15-20 minutes    How long can you walk comfortably? 5-10 minutes    Diagnostic tests Imaging "2 views lumbar spine: Compared to films obtained 02/08/2020 no significant changes.  No acute fractures.  No spondylolisthesis.  Degenerative changes L4-5 and L5-S1.  Facet changes lower lumbar spine.  Arthrosclerosis aorta noted    Currently in Pain? Yes    Pain Score 2     Pain Location Back    Pain Orientation Lower    Pain Descriptors / Indicators Aching;Sore    Pain Type Chronic pain    Pain Radiating Towards B knees    Pain Onset More than a month ago    Pain Frequency Constant    Aggravating Factors  Worst with walking, prolonged postures and first thing in the morning    Pain Relieving Factors Rest    Effect of Pain on Daily Activities Endurance and strength limit her abaility to participate in ADLs    Multiple Pain Sites No                [facility] PT Assessment - 01/17/21 0001       ROM / Strength   AROM / PROM / Strength AROM      AROM  Overall AROM  Deficits    AROM Assessment Site Lumbar;Hip    Right/Left Hip Left;Right    Right Hip Flexion 100    Right Hip External Rotation  35    Right Hip Internal Rotation  12    Left Hip Flexion 110    Left Hip External Rotation  35    Left Hip Internal Rotation  15    Lumbar Extension 10      Flexibility   Soft Tissue Assessment /Muscle Length yes    Hamstrings 45 degrees/45 degrees      Balance   Balance Assessed Yes      Standardized Balance Assessment   Standardized Balance Assessment Berg Balance Test      Berg Balance Test   Sit to Stand Able to stand without using hands and stabilize independently    Standing Unsupported Able to stand safely 2 minutes    Sitting with Back Unsupported but Feet Supported on Floor or Stool Able to sit safely and securely 2 minutes    Stand to Sit Sits safely with minimal use of hands    Transfers Able to transfer safely, minor use of hands    Standing  Unsupported with Eyes Closed Able to stand 10 seconds safely    Standing Unsupported with Feet Together Able to place feet together independently and stand 1 minute safely    From Standing, Reach Forward with Outstretched Arm Reaches forward but needs supervision    From Standing Position, Pick up Object from Floor Able to pick up shoe safely and easily    From Standing Position, Turn to Look Behind Over each Shoulder Looks behind from both sides and weight shifts well    Turn 360 Degrees Able to turn 360 degrees safely in 4 seconds or less    Standing Unsupported, Alternately Place Feet on Step/Stool Able to complete >2 steps/needs minimal assist    Standing Unsupported, One Foot in [employer] balance while stepping or standing    Standing on One Leg Unable to try or needs assist to prevent fall    Total Score 42                           [facility] Adult PT Treatment/Exercise - 01/17/21 0001       Posture/Postural Control   Posture/Postural Control Postural limitations    Postural Limitations Forward head;Rounded Shoulders;Decreased lumbar lordosis      Balance   Balance Assessed Yes      Standardized Balance Assessment   Standardized Balance Assessment Berg Balance Test      Berg Balance Test   Sit to Stand Able to stand without using hands and stabilize independently    Standing Unsupported Able to stand safely 2 minutes    Sitting with Back Unsupported but Feet Supported on Floor or Stool Able to sit safely and securely 2 minutes    Stand to Sit Sits safely with minimal use of hands    Transfers Able to transfer safely, minor use of hands    Standing Unsupported with Eyes Closed Able to stand 10 seconds safely    Standing Ubsupported with Feet Together Able to place feet together independently and stand 1 minute safely    From Standing, Reach Forward with Outstretched Arm Reaches forward but needs supervision    From Standing Position, Pick up Object from Floor  Able to pick up shoe safely and easily    From Standing Position,  Turn to Look Behind Over each Shoulder Looks behind from both sides and weight shifts well    Turn 360 Degrees Able to turn 360 degrees safely in 4 seconds or less    Standing Unsupported, Alternately Place Feet on Step/Stool Able to complete >2 steps/needs minimal assist    Standing Unsupported, One Foot in [employer] balance while stepping or standing    Standing on One Leg Unable to try or needs assist to prevent fall    Total Score 42      Exercises   Exercises Lumbar      Lumbar Exercises: Stretches   Figure 4 Stretch 5 reps;20 seconds      Lumbar Exercises: Standing   Scapular Retraction Strengthening;Both;10 reps;Limitations    Scapular Retraction Limitations 5 seconds    Other Standing Lumbar Exercises Trunk extension AROM 2 sets of 10X 3 seconds    Other Standing Lumbar Exercises Alternating hip extensions 2 sets of 10 for 3 seconds      Lumbar Exercises: Prone   Straight Leg Raise 10 reps;3 seconds;Limitations    Straight Leg Raises Limitations 2 sets prone alternating hip extension with ankles in dorsiflexion                    PT Education - 01/17/21 1627     Education Details Reviewed Merrilee Jansky results and added 3 exercises to her HEP    Person(s) Educated Patient    Methods Explanation;Demonstration;Tactile cues;Verbal cues;Handout    Comprehension Verbal cues required;Returned demonstration;Need further instruction;Tactile cues required;Verbalized understanding              PT Short Term Goals - 01/17/21 1628       PT SHORT TERM GOAL #1   Title be independent in initial HEP    Time 4    Period Weeks    Status Achieved    Target Date 01/22/21      PT SHORT TERM GOAL #2   Title -               PT Long Term Goals - 01/17/21 1628       PT LONG TERM GOAL #1   Title She will improve FOTO to at least 62% functional score    Time 8    Period Weeks    Status On-going       PT LONG TERM GOAL #2   Title She will reduce pain to overall less than 3/10 with usual activity    Time 8    Period Weeks    Status Partially Met      PT LONG TERM GOAL #3   Title balance goal to be written after additional testing in upcoming visits.    Baseline 42/56 Berg, Goal 48/56    Time 4    Period Weeks    Status New    Target Date 02/14/21      PT LONG TERM GOAL #4   Title -      PT LONG TERM GOAL #5   Title -                   Plan - 01/17/21 1631     Clinical Impression Statement Merrilee Jansky is 42/56 which is an elevated falls risk.  Alyene would like to be stronger, have better endurance and better balance before transfer into independent PT.  Back pain is slowly improving as postural strength and awareness improve.    Personal Factors and Comorbidities Comorbidity  3+    Comorbidities DIY:MEBRA disorder, Lt TKA,obesity,RTC tear, narcotic dependance.    Examination-Activity Limitations Bend;Lift;Locomotion Level;Carry;Sleep;Stairs;Stand;Transfers    Examination-Participation Restrictions Cleaning;Community Activity;Driving;Shop;Laundry    Stability/Clinical Decision Making Evolving/Moderate complexity    Rehab Potential Good    PT Frequency 2x / week   1-2   PT Duration 4 weeks    PT Treatment/Interventions ADLs/Self Care Home Management;Therapeutic activities;Therapeutic exercise;Neuromuscular re-education;Patient/family education;Manual techniques    PT Next Visit Plan Balance, practical body mechanics and spine strength/leg strength progressions    PT Home Exercise Plan Access Code: [email or web address]    Consulted and Agree with Plan of Care Patient             Patient will benefit from skilled therapeutic intervention in order to improve the following deficits and impairments:  Decreased activity tolerance, Abnormal gait, Decreased balance, Decreased endurance, Decreased strength, Difficulty walking, Increased muscle spasms, Postural dysfunction, Improper body  mechanics, Pain, Obesity  Visit Diagnosis: Difficulty walking  Abnormal posture  Muscle weakness (generalized)  Chronic bilateral low back pain with bilateral sciatica  Unsteadiness on feet     Problem List Patient Active Problem List   Diagnosis Date Noted   Vitamin D deficiency 02/17/2020   Sleep disorder 07/10/2018   ADHD (attention deficit hyperactivity disorder) 10/01/2017   Unilateral primary osteoarthritis, right knee 08/20/2017   Chronic pain of both shoulders 08/20/2017   Complete tear of right rotator cuff 04/29/2017   Osteoarthritis of left knee 06/16/2015   Status post total left knee replacement 06/16/2015   SUBSTANCE ABUSE 02/06/2009   Attention deficit disorder 09/14/2007   COLONIC POLYPS, HX OF 08/03/2007   Essential hypertension 01/20/2007   OBESITY NOS 12/04/2006   Allergic rhinitis 12/04/2006   GASTROJEJUNOSTOMY, HX OF 12/04/2006   BREAST BIOPSY, HX OF 12/04/2006    Farley Ly PT, MPT 01/17/2021, 4:34 PM  [facility] [address] [place], [place], [address] Phone: [phone]   Fax:  [phone]  Name: MELDA MERMELSTEIN MRN: [id number] Date of Birth: 1951-06-14

## 2021-01-18 ENCOUNTER — Encounter: Payer: Medicare PPO | Admitting: Physical Therapy

## 2021-01-19 ENCOUNTER — Encounter: Payer: Medicare PPO | Admitting: Rehabilitative and Restorative Service Providers"

## 2021-01-23 ENCOUNTER — Other Ambulatory Visit: Payer: Self-pay | Admitting: Internal Medicine

## 2021-01-23 DIAGNOSIS — F900 Attention-deficit hyperactivity disorder, predominantly inattentive type: Secondary | ICD-10-CM

## 2021-01-23 NOTE — Telephone Encounter (Signed)
Patient has an appointment 02/16/21.  Okay to refill?

## 2021-01-24 ENCOUNTER — Encounter: Payer: Medicare PPO | Admitting: Physical Therapy

## 2021-01-24 MED ORDER — AMPHETAMINE-DEXTROAMPHETAMINE 20 MG PO TABS: ORAL_TABLET | ORAL | 0 refills | Status: DC

## 2021-01-24 MED ORDER — AMPHETAMINE-DEXTROAMPHET ER 30 MG PO CP24: 30.0000 mg | ORAL_CAPSULE | ORAL | 0 refills | Status: DC

## 2021-01-25 ENCOUNTER — Ambulatory Visit (INDEPENDENT_AMBULATORY_CARE_PROVIDER_SITE_OTHER): Payer: Medicare PPO | Admitting: Rehabilitative and Restorative Service Providers"

## 2021-01-25 ENCOUNTER — Encounter: Payer: Self-pay | Admitting: Rehabilitative and Restorative Service Providers"

## 2021-01-25 ENCOUNTER — Other Ambulatory Visit: Payer: Self-pay

## 2021-01-25 DIAGNOSIS — R2681 Unsteadiness on feet: Secondary | ICD-10-CM | POA: Diagnosis not present

## 2021-01-25 DIAGNOSIS — R262 Difficulty in walking, not elsewhere classified: Secondary | ICD-10-CM

## 2021-01-25 DIAGNOSIS — R293 Abnormal posture: Secondary | ICD-10-CM

## 2021-01-25 DIAGNOSIS — G8929 Other chronic pain: Secondary | ICD-10-CM | POA: Diagnosis not present

## 2021-01-25 DIAGNOSIS — M5442 Lumbago with sciatica, left side: Secondary | ICD-10-CM

## 2021-01-25 DIAGNOSIS — M5441 Lumbago with sciatica, right side: Secondary | ICD-10-CM | POA: Diagnosis not present

## 2021-01-25 DIAGNOSIS — M6281 Muscle weakness (generalized): Secondary | ICD-10-CM | POA: Diagnosis not present

## 2021-01-25 NOTE — Therapy (Signed)
[facility] Physical Therapy [address] [place], [place], [address] Phone: [phone]   Fax:  [phone]  Physical Therapy Treatment  Patient Details  Name: Leslie [name] MRN: [id number] Date of Birth: 06-Sep-1950 Referring Provider (PT): Carney Bern   Encounter Date: 01/25/2021   PT End of Session - 01/25/21 1648     Visit Number 5    Number of Visits 12    Date for PT Re-Evaluation 02/19/21    Authorization Type [employer]    PT Start Time 1309    PT Stop Time 4142    PT Time Calculation (min) 38 min    Activity Tolerance Patient tolerated treatment well;No increased pain;Patient limited by fatigue    Behavior During Therapy [facility] for tasks assessed/performed             Past Medical History:  Diagnosis Date   ADD (attention deficit disorder)    Allergy    Arthritis 2016   osteoarthritis left knee   Colon polyps    Hypertension    Left knee pain    chronic   Narcotic addiction ([place])    Obesity    post gastric bypass   Vitamin D deficiency     Past Surgical History:  Procedure Laterality Date   arthscopic knee surgery Left    several times   BREAST BIOPSY     BREAST SURGERY  years ago   breast biopsy   CHOLECYSTECTOMY     EYE SURGERY Bilateral 2013   Toric Lens implants   EYE SURGERY Right 2014   corneal amniotic membrane   GASTRIC BYPASS  2003   KIDNEY DONATION  2004   TOTAL KNEE ARTHROPLASTY Left 06/16/2015   Procedure: LEFT TOTAL KNEE ARTHROPLASTY;  Surgeon: Mcarthur Rossetti, MD;  Location: [facility];  Service: Orthopedics;  Laterality: Left;    There were no vitals filed for this visit.   Subjective Assessment - 01/25/21 1644     Subjective Lasharn is doing much better with her back since starting PT.  She is concerned about her balance and would like to reduce her future falls risk.    Pertinent History LTR:VUYEB disorder, Lt TKA, obesity, RTC tear    Limitations Lifting;Standing;Walking;House hold activities    How  long can you stand comfortably? 15-20 minutes    How long can you walk comfortably? 5-10 minutes    Diagnostic tests Imaging "2 views lumbar spine: Compared to films obtained 02/08/2020 no significant changes.  No acute fractures.  No spondylolisthesis.  Degenerative changes L4-5 and L5-S1.  Facet changes lower lumbar spine.  Arthrosclerosis aorta noted    Currently in Pain? No/denies    Pain Score 0-No pain    Pain Location Back    Pain Orientation Lower    Pain Descriptors / Indicators Tightness    Pain Type Chronic pain    Pain Radiating Towards Can radiate to B knees    Pain Onset More than a month ago    Pain Frequency Intermittent    Aggravating Factors  Worst with prolonged postures and fatigue    Pain Relieving Factors Change of position    Effect of Pain on Daily Activities Poor endurance to activity and inconsistent body mechanics    Multiple Pain Sites No                [facility] PT Assessment - 01/25/21 0001       Observation/Other Assessments   Focus on Therapeutic Outcomes (FOTO)  56 (was 47,  Goal 62)                           [facility] Adult PT Treatment/Exercise - 01/25/21 0001       Posture/Postural Control   Posture/Postural Control Postural limitations    Postural Limitations Forward head;Rounded Shoulders;Decreased lumbar lordosis      Neuro Re-ed    Neuro Re-ed Details  Tandem balance eyes open, closed and head turning eyes open 3X 20 seconds each      Exercises   Exercises Lumbar      Lumbar Exercises: Stretches   Figure 4 Stretch 5 reps;20 seconds      Lumbar Exercises: Standing   Scapular Retraction Strengthening;Both;10 reps;Limitations    Scapular Retraction Limitations 5 seconds    Other Standing Lumbar Exercises Trunk extension AROM 2 sets of 10X 3 seconds    Other Standing Lumbar Exercises Alternating hip extensions 2 sets of 10 for 3 seconds      Lumbar Exercises: Prone   Straight Leg Raise 10 reps;3 seconds;Limitations     Straight Leg Raises Limitations 2 sets prone alternating hip extension with ankles in dorsiflexion                    PT Education - 01/25/21 1646     Education Details Reviewed HEP with spine, balance and general strength activities.    Person(s) Educated Patient    Methods Explanation;Demonstration;Verbal cues    Comprehension Verbal cues required;Returned demonstration;Need further instruction;Verbalized understanding              PT Short Term Goals - 01/17/21 1628       PT SHORT TERM GOAL #1   Title be independent in initial HEP    Time 4    Period Weeks    Status Achieved    Target Date 01/22/21      PT SHORT TERM GOAL #2   Title -               PT Long Term Goals - 01/25/21 1647       PT LONG TERM GOAL #1   Title She will improve FOTO to at least 62% functional score    Baseline 56 (was 47, Goal 62)    Time 8    Period Weeks    Status On-going      PT LONG TERM GOAL #2   Title She will reduce pain to overall less than 3/10 with usual activity    Baseline Can be > 3/10 with prolonged postures or fatigue    Time 8    Period Weeks    Status Partially Met      PT LONG TERM GOAL #3   Title balance goal to be written after additional testing in upcoming visits.    Baseline 42/56 Berg at original assessment, now 44/56 (Goal 48/56)    Time 4    Period Weeks    Status On-going      PT LONG TERM GOAL #4   Title -      PT LONG TERM GOAL #5   Title -                   Plan - 01/25/21 1648     Clinical Impression Statement Marti Sleigh notes she is emotionally drained today after dealing with probate from her spouce's recent death.  Her back is doing better although body mechanics are inconsistent.  General strength,  balance and conditioning will benefit from the recommended course of PT.    Personal Factors and Comorbidities Comorbidity 3+    Comorbidities SYV:GCYOY disorder, Lt TKA,obesity,RTC tear, narcotic dependance.     Examination-Activity Limitations Bend;Lift;Locomotion Level;Carry;Sleep;Stairs;Stand;Transfers    Examination-Participation Restrictions Cleaning;Community Activity;Driving;Shop;Laundry    Stability/Clinical Decision Making Evolving/Moderate complexity    Rehab Potential Good    PT Frequency 2x / week   1-2   PT Duration 4 weeks    PT Treatment/Interventions ADLs/Self Care Home Management;Therapeutic activities;Therapeutic exercise;Neuromuscular re-education;Patient/family education;Manual techniques    PT Next Visit Plan Balance, practical body mechanics and spine strength/leg strength progressions    PT Home Exercise Plan Access Code: [email or web address]    Consulted and Agree with Plan of Care Patient             Patient will benefit from skilled therapeutic intervention in order to improve the following deficits and impairments:  Decreased activity tolerance, Abnormal gait, Decreased balance, Decreased endurance, Decreased strength, Difficulty walking, Increased muscle spasms, Postural dysfunction, Improper body mechanics, Pain, Obesity  Visit Diagnosis: Difficulty walking  Abnormal posture  Muscle weakness (generalized)  Chronic bilateral low back pain with bilateral sciatica  Unsteadiness on feet     Problem List Patient Active Problem List   Diagnosis Date Noted   Vitamin D deficiency 02/17/2020   Sleep disorder 07/10/2018   ADHD (attention deficit hyperactivity disorder) 10/01/2017   Unilateral primary osteoarthritis, right knee 08/20/2017   Chronic pain of both shoulders 08/20/2017   Complete tear of right rotator cuff 04/29/2017   Osteoarthritis of left knee 06/16/2015   Status post total left knee replacement 06/16/2015   SUBSTANCE ABUSE 02/06/2009   Attention deficit disorder 09/14/2007   COLONIC POLYPS, HX OF 08/03/2007   Essential hypertension 01/20/2007   OBESITY NOS 12/04/2006   Allergic rhinitis 12/04/2006   GASTROJEJUNOSTOMY, HX OF 12/04/2006   BREAST BIOPSY,  HX OF 12/04/2006    Farley Ly PT, MPT 01/25/2021, 4:51 PM  [facility] Physical Therapy [address] [place], [place], [address] Phone: [phone]   Fax:  [phone]  Name: CALIA NAPP MRN: [id number] Date of Birth: 1951/01/18

## 2021-01-26 ENCOUNTER — Encounter: Payer: Self-pay | Admitting: Rehabilitative and Restorative Service Providers"

## 2021-01-26 ENCOUNTER — Ambulatory Visit: Payer: Medicare PPO | Admitting: Rehabilitative and Restorative Service Providers"

## 2021-01-26 ENCOUNTER — Encounter: Payer: Medicare PPO | Admitting: Physical Therapy

## 2021-01-26 DIAGNOSIS — M5442 Lumbago with sciatica, left side: Secondary | ICD-10-CM | POA: Diagnosis not present

## 2021-01-26 DIAGNOSIS — G8929 Other chronic pain: Secondary | ICD-10-CM

## 2021-01-26 DIAGNOSIS — R293 Abnormal posture: Secondary | ICD-10-CM

## 2021-01-26 DIAGNOSIS — M5441 Lumbago with sciatica, right side: Secondary | ICD-10-CM | POA: Diagnosis not present

## 2021-01-26 DIAGNOSIS — R2681 Unsteadiness on feet: Secondary | ICD-10-CM

## 2021-01-26 DIAGNOSIS — R262 Difficulty in walking, not elsewhere classified: Secondary | ICD-10-CM | POA: Diagnosis not present

## 2021-01-26 DIAGNOSIS — M6281 Muscle weakness (generalized): Secondary | ICD-10-CM | POA: Diagnosis not present

## 2021-01-26 NOTE — Therapy (Signed)
[facility] Physical Therapy [address] [place], [place], [address] Phone: [phone]   Fax:  [phone]  Physical Therapy Treatment  Patient Details  Name: Leslie [name] MRN: [id number] Date of Birth: 24-Dec-1950 Referring Provider (PT): Carney Bern   Encounter Date: 01/26/2021   PT End of Session - 01/26/21 1630     Visit Number 6    Number of Visits 12    Date for PT Re-Evaluation 02/19/21    Authorization Type [employer]    PT Start Time 0093    PT Stop Time 1430    PT Time Calculation (min) 45 min    Activity Tolerance Patient tolerated treatment well;No increased pain;Patient limited by fatigue    Behavior During Therapy [facility] for tasks assessed/performed             Past Medical History:  Diagnosis Date   ADD (attention deficit disorder)    Allergy    Arthritis 2016   osteoarthritis left knee   Colon polyps    Hypertension    Left knee pain    chronic   Narcotic addiction ([place])    Obesity    post gastric bypass   Vitamin D deficiency     Past Surgical History:  Procedure Laterality Date   arthscopic knee surgery Left    several times   BREAST BIOPSY     BREAST SURGERY  years ago   breast biopsy   CHOLECYSTECTOMY     EYE SURGERY Bilateral 2013   Toric Lens implants   EYE SURGERY Right 2014   corneal amniotic membrane   GASTRIC BYPASS  2003   KIDNEY DONATION  2004   TOTAL KNEE ARTHROPLASTY Left 06/16/2015   Procedure: LEFT TOTAL KNEE ARTHROPLASTY;  Surgeon: Mcarthur Rossetti, MD;  Location: [facility];  Service: Orthopedics;  Laterality: Left;    There were no vitals filed for this visit.   Subjective Assessment - 01/26/21 1417     Subjective Shikita reports no back pain today.    Pertinent History GHW:EXHBZ disorder, Lt TKA, obesity, RTC tear    Limitations Lifting;Standing;Walking;House hold activities    How long can you stand comfortably? 15-20 minutes    How long can you walk comfortably? 5-10 minutes     Diagnostic tests Imaging "2 views lumbar spine: Compared to films obtained 02/08/2020 no significant changes.  No acute fractures.  No spondylolisthesis.  Degenerative changes L4-5 and L5-S1.  Facet changes lower lumbar spine.  Arthrosclerosis aorta noted    Currently in Pain? No/denies    Pain Score 0-No pain    Pain Location Back    Pain Orientation Lower    Pain Descriptors / Indicators Tightness    Pain Type Chronic pain    Pain Radiating Towards Used to get sciatica to B knees    Pain Onset More than a month ago    Pain Frequency Intermittent    Aggravating Factors  Needs to watch body mechanics and pace herself    Pain Relieving Factors Change of position    Effect of Pain on Daily Activities Working on balance and general (core and LE) strength    Multiple Pain Sites No                               [facility] Adult PT Treatment/Exercise - 01/26/21 0001       Posture/Postural Control   Posture/Postural Control Postural limitations    Postural Limitations  Forward head;Rounded Shoulders;Decreased lumbar lordosis      Therapeutic Activites    Other Therapeutic Activities Tap-up on 8 inch step and dynamic heel to toe raises      Neuro Re-ed    Neuro Re-ed Details  Tandem balance eyes open, closed and head turning eyes open 3X 20 seconds each      Exercises   Exercises Lumbar      Lumbar Exercises: Stretches   Figure 4 Stretch 5 reps;20 seconds      Lumbar Exercises: Standing   Scapular Retraction Strengthening;Both;10 reps;Limitations    Scapular Retraction Limitations 5 seconds    Other Standing Lumbar Exercises Trunk extension AROM 2 sets of 10X 3 seconds    Other Standing Lumbar Exercises Alternating hip extensions 2 sets of 10 for 3 seconds      Lumbar Exercises: Prone   Straight Leg Raise 10 reps;3 seconds;Limitations    Straight Leg Raises Limitations 2 sets prone alternating hip extension with ankles in dorsiflexion                     PT Education - 01/26/21 1629     Education Details Reviewed HEP with particular emphasis on balance and functional activities.    Person(s) Educated Patient    Methods Explanation;Demonstration;Verbal cues    Comprehension Verbal cues required;Need further instruction;Returned demonstration;Verbalized understanding              PT Short Term Goals - 01/17/21 1628       PT SHORT TERM GOAL #1   Title be independent in initial HEP    Time 4    Period Weeks    Status Achieved    Target Date 01/22/21      PT SHORT TERM GOAL #2   Title -               PT Long Term Goals - 01/26/21 1630       PT LONG TERM GOAL #1   Title She will improve FOTO to at least 62% functional score    Baseline 56 (was 47, Goal 62)    Time 8    Period Weeks    Status On-going      PT LONG TERM GOAL #2   Title She will reduce pain to overall less than 3/10 with usual activity    Baseline Can be > 3/10 with prolonged postures or fatigue    Time 8    Period Weeks    Status Partially Met      PT LONG TERM GOAL #3   Title balance goal to be written after additional testing in upcoming visits.    Baseline 42/56 Berg at original assessment, now 44/56 (Goal 48/56)    Time 4    Period Weeks    Status On-going      PT LONG TERM GOAL #4   Title -      PT LONG TERM GOAL #5   Title -                   Plan - 01/26/21 1631     Clinical Impression Statement Almadelia is very happy with the reduction in her back pain and sciatica since starting PT.  Balance and functional activities like stairs have become more of an emphasis and will benefit from continued supervised PT.    Personal Factors and Comorbidities Comorbidity 3+    Comorbidities KPV:VZSMO disorder, Lt TKA,obesity,RTC tear, narcotic dependance.  Examination-Activity Limitations Bend;Lift;Locomotion Level;Carry;Sleep;Stairs;Stand;Transfers    Examination-Participation Restrictions Cleaning;Community  Activity;Driving;Shop;Laundry    Stability/Clinical Decision Making Evolving/Moderate complexity    Rehab Potential Good    PT Frequency 2x / week   1-2   PT Duration 4 weeks    PT Treatment/Interventions ADLs/Self Care Home Management;Therapeutic activities;Therapeutic exercise;Neuromuscular re-education;Patient/family education;Manual techniques    PT Next Visit Plan Balance, practical body mechanics and spine strength/leg strength progressions    PT Home Exercise Plan Access Code: [email or web address]    Consulted and Agree with Plan of Care Patient             Patient will benefit from skilled therapeutic intervention in order to improve the following deficits and impairments:  Decreased activity tolerance, Abnormal gait, Decreased balance, Decreased endurance, Decreased strength, Difficulty walking, Increased muscle spasms, Postural dysfunction, Improper body mechanics, Pain, Obesity  Visit Diagnosis: Difficulty walking  Abnormal posture  Muscle weakness (generalized)  Chronic bilateral low back pain with bilateral sciatica  Unsteadiness on feet     Problem List Patient Active Problem List   Diagnosis Date Noted   Vitamin D deficiency 02/17/2020   Sleep disorder 07/10/2018   ADHD (attention deficit hyperactivity disorder) 10/01/2017   Unilateral primary osteoarthritis, right knee 08/20/2017   Chronic pain of both shoulders 08/20/2017   Complete tear of right rotator cuff 04/29/2017   Osteoarthritis of left knee 06/16/2015   Status post total left knee replacement 06/16/2015   SUBSTANCE ABUSE 02/06/2009   Attention deficit disorder 09/14/2007   COLONIC POLYPS, HX OF 08/03/2007   Essential hypertension 01/20/2007   OBESITY NOS 12/04/2006   Allergic rhinitis 12/04/2006   GASTROJEJUNOSTOMY, HX OF 12/04/2006   BREAST BIOPSY, HX OF 12/04/2006    Farley Ly PT, MPT 01/26/2021, 4:33 PM  [facility] Physical Therapy [address] [place], [place],  [address] Phone: [phone]   Fax:  [phone]  Name: KUMARI SCULLEY MRN: [id number] Date of Birth: [birth year]

## 2021-01-31 ENCOUNTER — Encounter: Payer: Medicare PPO | Admitting: Rehabilitative and Restorative Service Providers"

## 2021-01-31 ENCOUNTER — Other Ambulatory Visit: Payer: Self-pay | Admitting: Internal Medicine

## 2021-01-31 DIAGNOSIS — G479 Sleep disorder, unspecified: Secondary | ICD-10-CM

## 2021-02-01 MED ORDER — ALPRAZOLAM 0.25 MG PO TABS: ORAL_TABLET | ORAL | 0 refills | Status: DC

## 2021-02-01 NOTE — Telephone Encounter (Signed)
Last refill 12/20/20 Last virtual visit 09/12/20

## 2021-02-02 ENCOUNTER — Ambulatory Visit: Payer: Medicare PPO | Admitting: Rehabilitative and Restorative Service Providers"

## 2021-02-02 ENCOUNTER — Encounter: Payer: Self-pay | Admitting: Rehabilitative and Restorative Service Providers"

## 2021-02-02 ENCOUNTER — Other Ambulatory Visit: Payer: Self-pay

## 2021-02-02 DIAGNOSIS — M6281 Muscle weakness (generalized): Secondary | ICD-10-CM

## 2021-02-02 DIAGNOSIS — R293 Abnormal posture: Secondary | ICD-10-CM | POA: Diagnosis not present

## 2021-02-02 DIAGNOSIS — G8929 Other chronic pain: Secondary | ICD-10-CM | POA: Diagnosis not present

## 2021-02-02 DIAGNOSIS — M5442 Lumbago with sciatica, left side: Secondary | ICD-10-CM | POA: Diagnosis not present

## 2021-02-02 DIAGNOSIS — R2681 Unsteadiness on feet: Secondary | ICD-10-CM

## 2021-02-02 DIAGNOSIS — R262 Difficulty in walking, not elsewhere classified: Secondary | ICD-10-CM | POA: Diagnosis not present

## 2021-02-02 DIAGNOSIS — M5441 Lumbago with sciatica, right side: Secondary | ICD-10-CM

## 2021-02-02 NOTE — Therapy (Signed)
[facility] [address] [place], [place], [address] Phone: [phone]   Fax:  [phone]  Physical Therapy Treatment  Patient Details  Name: Leslie [name] MRN: [id number] Date of Birth: December 21, 1950 Referring Provider (PT): Carney Bern   Encounter Date: 02/02/2021   PT End of Session - 02/02/21 1425     Visit Number 7    Number of Visits 12    Date for PT Re-Evaluation 02/19/21    Authorization Type [employer]    PT Start Time 1346    PT Stop Time 1427    PT Time Calculation (min) 41 min    Equipment Utilized During Treatment Gait belt    Activity Tolerance Patient tolerated treatment well;No increased pain;Patient limited by fatigue    Behavior During Therapy [facility] for tasks assessed/performed             Past Medical History:  Diagnosis Date   ADD (attention deficit disorder)    Allergy    Arthritis 2016   osteoarthritis left knee   Colon polyps    Hypertension    Left knee pain    chronic   Narcotic addiction ([place])    Obesity    post gastric bypass   Vitamin D deficiency     Past Surgical History:  Procedure Laterality Date   arthscopic knee surgery Left    several times   BREAST BIOPSY     BREAST SURGERY  years ago   breast biopsy   CHOLECYSTECTOMY     EYE SURGERY Bilateral 2013   Toric Lens implants   EYE SURGERY Right 2014   corneal amniotic membrane   GASTRIC BYPASS  2003   KIDNEY DONATION  2004   TOTAL KNEE ARTHROPLASTY Left 06/16/2015   Procedure: LEFT TOTAL KNEE ARTHROPLASTY;  Surgeon: Mcarthur Rossetti, MD;  Location: [facility];  Service: Orthopedics;  Laterality: Left;    There were no vitals filed for this visit.   Subjective Assessment - 02/02/21 1410     Subjective Leslie [name] reports minimal low back pain but every morning she gets tingling in her legs as distal as the ankle.  Once she is up and takes a few steps, symptoms resolve.    Pertinent History GSU:PJSRP disorder, Lt TKA, obesity, RTC  tear    Limitations Lifting;Standing;Walking;House hold activities    How long can you stand comfortably? 1.5 - 2 hours (was 15 - 20 minutes)    How long can you walk comfortably? 25 minutes (was 5 - 10 minutes)    Diagnostic tests Imaging "2 views lumbar spine: Compared to films obtained 02/08/2020 no significant changes.  No acute fractures.  No spondylolisthesis.  Degenerative changes L4-5 and L5-S1.  Facet changes lower lumbar spine.  Arthrosclerosis aorta noted    Currently in Pain? No/denies    Pain Score 0-No pain    Pain Location Leg    Pain Orientation Left;Right    Pain Descriptors / Indicators Tingling    Pain Radiating Towards B ankles in the mornings    Pain Onset More than a month ago    Pain Frequency Intermittent    Aggravating Factors  Staying in one position too long, poor posture or poor mechanics    Pain Relieving Factors Change of position and postural correction    Effect of Pain on Daily Activities Balance, although improved, is still an issue.  Leslie [name] has some apprehension with her balance.    Multiple Pain Sites No  [place] Adult PT Treatment/Exercise - 02/02/21 0001       Posture/Postural Control   Posture/Postural Control Postural limitations    Postural Limitations Forward head;Rounded Shoulders;Decreased lumbar lordosis      Balance   Balance Assessed Yes      Standardized Balance Assessment   Standardized Balance Assessment Berg Balance Test      Berg Balance Test   Sit to Stand Able to stand without using hands and stabilize independently    Standing Unsupported Able to stand safely 2 minutes    Sitting with Back Unsupported but Feet Supported on Floor or Stool Able to sit safely and securely 2 minutes    Stand to Sit Sits safely with minimal use of hands    Transfers Able to transfer safely, minor use of hands    Standing Unsupported with Eyes Closed Able to stand 10 seconds safely    Standing  Ubsupported with Feet Together Able to place feet together independently and stand 1 minute safely    From Standing, Reach Forward with Outstretched Arm Reaches forward but needs supervision    From Standing Position, Pick up Object from [place] to pick up shoe safely and easily    From Standing Position, Turn to Look Behind Over each Shoulder Looks behind from both sides and weight shifts well    Turn 360 Degrees Able to turn 360 degrees safely in 4 seconds or less    Standing Unsupported, Alternately Place Feet on Step/Stool Able to stand independently and safely and complete 8 steps in 20 seconds    Standing Unsupported, One Foot in Front Able to plae foot ahead of the other independently and hold 30 seconds    Standing on One Leg Tries to lift leg/unable to hold 3 seconds but remains standing independently    Total Score 49      Therapeutic Activites    Other Therapeutic Activities Tap-up on 8 inch step and dynamic heel to toe raises      Neuro Re-ed    Neuro Re-ed Details  Tandem balance eyes open, closed and head turning eyes open 3X 20 seconds each, standing marching 3 second holds, heel to toe raises in doorframe without hands      Exercises   Exercises Lumbar      Lumbar Exercises: Stretches   Figure 4 Stretch 5 reps;20 seconds      Lumbar Exercises: Standing   Scapular Retraction Strengthening;Both;10 reps;Limitations    Scapular Retraction Limitations 5 seconds    Other Standing Lumbar Exercises Trunk extension AROM 2 sets of 10X 3 seconds    Other Standing Lumbar Exercises Alternating hip hike 2 sets of 10 for 3 seconds      Lumbar Exercises: Prone   Straight Leg Raise 10 reps;3 seconds;Limitations    Straight Leg Raises Limitations 2 sets prone alternating hip extension with ankles in dorsiflexion                    PT Education - 02/02/21 1423     Education Details Reviewed HEP with progressions to dynamic balance in the clinic    Person(s) Educated  Patient    Methods Explanation;Demonstration;Verbal cues    Comprehension Verbal cues required;Need further instruction;Returned demonstration;Verbalized understanding              PT Short Term Goals - 01/17/21 1628       PT SHORT TERM GOAL #1   Title be independent in initial HEP  Time 4    Period Weeks    Status Achieved    Target Date 01/22/21      PT SHORT TERM GOAL #2   Title -               PT Long Term Goals - 02/02/21 1424       PT LONG TERM GOAL #1   Title She will improve FOTO to at least 62% functional score    Baseline 56 (was 47, Goal 62)    Time 8    Period Weeks    Status On-going      PT LONG TERM GOAL #2   Title She will reduce pain to overall less than 3/10 with usual activity    Baseline Can be > 3/10 with prolonged postures or fatigue    Time 8    Period Weeks    Status Partially Met      PT LONG TERM GOAL #3   Title 49/56 Berg    Baseline 42/56 Berg at original assessment, now 49/56 (Goal 48/56)    Time 4    Period Weeks    Status Achieved      PT LONG TERM GOAL #4   Title -      PT LONG TERM GOAL #5   Title -                   Plan - 02/02/21 1427     Clinical Impression Statement Leslie [name] continues to make progress towards long-term goals.  Back pain is typically 0-2/10 with a daily Aleve.  She has had some B leg tingling in the mornings which quickly resolves with a few steps out of bed.  Leslie [name] scored 49/56 (was 42/56) on the Berg today showing improved balance and a decreased risk of future falls.  Continue balance, core and LE strength work to meet all LTGs.    Personal Factors and Comorbidities Comorbidity 3+    Comorbidities CXK:GYJEH disorder, Lt TKA,obesity, RTC tear, narcotic dependance.    Examination-Activity Limitations Bend;Lift;Locomotion Level;Carry;Sleep;Stairs;Stand;Transfers    Examination-Participation Restrictions Cleaning;Community Activity;Driving;Shop;Laundry    Stability/Clinical Decision  Making Evolving/Moderate complexity    Rehab Potential Good    PT Frequency 2x / week   1-2   PT Duration 4 weeks    PT Treatment/Interventions ADLs/Self Care Home Management;Therapeutic activities;Therapeutic exercise;Neuromuscular re-education;Patient/family education;Manual techniques    PT Next Visit Plan Balance, practical body mechanics and spine strength/leg strength progressions    PT Home Exercise Plan Access Code: [email or web address]    Consulted and Agree with Plan of Care Patient             Patient will benefit from skilled therapeutic intervention in order to improve the following deficits and impairments:  Decreased activity tolerance, Abnormal gait, Decreased balance, Decreased endurance, Decreased strength, Difficulty walking, Increased muscle spasms, Postural dysfunction, Improper body mechanics, Pain, Obesity  Visit Diagnosis: Difficulty walking  Abnormal posture  Muscle weakness (generalized)  Chronic bilateral low back pain with bilateral sciatica  Unsteadiness on feet     Problem List Patient Active Problem List   Diagnosis Date Noted   Vitamin D deficiency 02/17/2020   Sleep disorder 07/10/2018   ADHD (attention deficit hyperactivity disorder) 10/01/2017   Unilateral primary osteoarthritis, right knee 08/20/2017   Chronic pain of both shoulders 08/20/2017   Complete tear of right rotator cuff 04/29/2017   Osteoarthritis of left knee 06/16/2015   Status post total left knee replacement 06/16/2015   SUBSTANCE ABUSE  02/06/2009   Attention deficit disorder 09/14/2007   COLONIC POLYPS, HX OF 08/03/2007   Essential hypertension 01/20/2007   OBESITY NOS 12/04/2006   Allergic rhinitis 12/04/2006   GASTROJEJUNOSTOMY, HX OF 12/04/2006   BREAST BIOPSY, HX OF 12/04/2006    Farley Ly PT, MPT 02/02/2021, 2:36 PM  [facility] [address] [place], [place], [address] Phone: [phone]   Fax:  [phone]  Name: Leslie [name] MRN: [id number] Date of Birth: 02/13/1951

## 2021-02-02 NOTE — Patient Instructions (Signed)
Access Code: [email or web address] URL: [email or web address] Date: 02/02/2021 Prepared by: Pauletta Browns  Exercises Beginner Bridge - 1 x daily - 7 x weekly - 1-2 sets - 10 reps Supine Figure 4 Piriformis Stretch - 1 x daily - 7 x weekly - 1 sets - 4-5 reps - 20-30 hold Standing Row with Anchored Resistance - 1 x daily - 3 x weekly - 2-3 sets - 10-20 reps Shoulder extension with resistance - Neutral - 1 x daily - 3 x weekly - 2-3 sets - 10 reps Standing Tandem Balance with Counter Support - 2 x daily - 6 x weekly - 1 sets - 5 reps - 20-30 hold Standing Lumbar Extension at Wall - Forearms - 5 x daily - 7 x weekly - 1 sets - 5 reps - 3 seconds hold Standing Scapular Retraction - 5 x daily - 7 x weekly - 1 sets - 5 reps - 5 second hold Standing Hip Hiking - 5 x daily - 7 x weekly - 1-2 sets - 2-10 reps - 3 seconds hold Prone Hip Extension - 1 x daily - 7 x weekly - 2-3 sets - 10 reps - 3 seconds hold Heel Toe Raises with Counter Support - 1 x daily - 7 x weekly - 2 sets - 10 reps - 3 seconds hold Tandem Stance - 2 x daily - 7 x weekly - 1 sets - 5 reps - 20 second hold

## 2021-02-07 ENCOUNTER — Encounter: Payer: Medicare PPO | Admitting: Rehabilitative and Restorative Service Providers"

## 2021-02-09 ENCOUNTER — Encounter: Payer: Medicare PPO | Admitting: Rehabilitative and Restorative Service Providers"

## 2021-02-14 ENCOUNTER — Encounter: Payer: Self-pay | Admitting: Rehabilitative and Restorative Service Providers"

## 2021-02-14 ENCOUNTER — Ambulatory Visit: Payer: Medicare PPO | Admitting: Rehabilitative and Restorative Service Providers"

## 2021-02-14 ENCOUNTER — Other Ambulatory Visit: Payer: Self-pay

## 2021-02-14 DIAGNOSIS — G8929 Other chronic pain: Secondary | ICD-10-CM

## 2021-02-14 DIAGNOSIS — R293 Abnormal posture: Secondary | ICD-10-CM

## 2021-02-14 DIAGNOSIS — M5442 Lumbago with sciatica, left side: Secondary | ICD-10-CM | POA: Diagnosis not present

## 2021-02-14 DIAGNOSIS — R262 Difficulty in walking, not elsewhere classified: Secondary | ICD-10-CM

## 2021-02-14 DIAGNOSIS — M6281 Muscle weakness (generalized): Secondary | ICD-10-CM

## 2021-02-14 DIAGNOSIS — R2681 Unsteadiness on feet: Secondary | ICD-10-CM | POA: Diagnosis not present

## 2021-02-14 DIAGNOSIS — M5441 Lumbago with sciatica, right side: Secondary | ICD-10-CM

## 2021-02-14 NOTE — Therapy (Signed)
[facility] Physical Therapy [address] [place], [place], [address] Phone: [phone]   Fax:  [phone]  Physical Therapy Treatment/Recertification  Patient Details  Name: Leslie [name] MRN: [id number] Date of Birth: 1950/09/19 Referring Provider (PT): Carney Bern  Referring diagnosis? M54.42 Treatment diagnosis? (if different than referring diagnosis) R26.2  R29.3  M62.81  M54.42  M54.41 G89.29  R26.81 What was this (referring dx) caused by? _0  Surgery _1  Fall _2  Ongoing issue _3  Arthritis _4  Other: ____________  Laterality: _5  Rt _6  Lt _7  Both  Check all possible CPT codes:      _8  97110 (Therapeutic Exercise)  _9  92507 (SLP Treatment)  _10  62376 (Neuro Re-ed)   _11  28315 (Swallowing Treatment)   _12  17616 (Gait Training)   _13  07371 (Cognitive Training, 1st 15 minutes) _14  97140 (Manual Therapy)   _15  97130 (Cognitive Training, each add'l 15 minutes)  _16  97530 (Therapeutic Activities)  _17  Other, List CPT Code ____________    _18  06269 (Self Care)       _19  All codes above (97110 - 97535)  _20  97012 (Mechanical Traction)  _21  97014 (E-stim Unattended)  _22  97032 (E-stim manual)  _23  97033 (Ionto)  _24  97035 (Ultrasound)  _25  97760 (Orthotic Fit) _26  97750 (Physical Performance Training) _27  [id number] (Aquatic Therapy) _28  48546 (Contrast Bath) _29  27035 (Paraffin) _30  97597 (Wound Care 1st 20 sq cm) _31  97598 (Wound Care each add'l 20 sq cm) _32  97016 (Vasopneumatic Device) _33  00938 (Orthotic Training) _34  18299 (Prosthetic Training)  Encounter Date: 02/14/2021   PT End of Session - 02/14/21 1612     Visit Number 8    Number of Visits 12    Date for PT Re-Evaluation 03/14/21    Authorization Type [employer]    Authorization - Visit Number 8    Authorization - Number of Visits 10    Progress Note Due on Visit 18    PT Start Time 1300    PT Stop Time 1346    PT Time Calculation (min) 46 min    Equipment Utilized During Treatment Gait belt     Activity Tolerance Patient tolerated treatment well;No increased pain;Patient limited by fatigue    Behavior During Therapy [facility] for tasks assessed/performed             Past Medical History:  Diagnosis Date   ADD (attention deficit disorder)    Allergy    Arthritis 2016   osteoarthritis left knee   Colon polyps    Hypertension    Left knee pain    chronic   Narcotic addiction ([place])    Obesity    post gastric bypass   Vitamin D deficiency     Past Surgical History:  Procedure Laterality Date   arthscopic knee surgery Left    several times   BREAST BIOPSY     BREAST SURGERY  years ago   breast biopsy   CHOLECYSTECTOMY     EYE SURGERY Bilateral 2013   Toric Lens implants   EYE SURGERY Right 2014   corneal amniotic membrane   GASTRIC BYPASS  2003   KIDNEY DONATION  2004   TOTAL KNEE ARTHROPLASTY Left 06/16/2015   Procedure: LEFT TOTAL KNEE ARTHROPLASTY;  Surgeon: Mcarthur Rossetti, MD;  Location: [facility];  Service: Orthopedics;  Laterality: Left;    There were no vitals filed for this visit.   Subjective Assessment - 02/14/21 1341     Subjective Leslie [name] reports minimal low back pain but every morning she gets tingling in  her R leg as distal as the ankle.  Once she is up and takes a few steps, symptoms resolve.  She reports poor compliance with core strengthening over the past week.    Pertinent History FMB:WGYKZ disorder, Lt TKA, obesity, RTC tear    Limitations Lifting;Standing;Walking;House hold activities    How long can you stand comfortably? 1.5 - 2 hours (was 15 - 20 minutes)    How long can you walk comfortably? 25 minutes (was 5 - 10 minutes)    Diagnostic tests Imaging "2 views lumbar spine: Compared to films obtained 02/08/2020 no significant changes.  No acute fractures.  No spondylolisthesis.  Degenerative changes L4-5 and L5-S1.  Facet changes lower lumbar spine.  Arthrosclerosis aorta noted    Currently in Pain? No/denies    Pain Score 0-No pain     Pain Location Leg    Pain Orientation Left;Right    Pain Descriptors / Indicators Tingling    Pain Type Chronic pain    Pain Radiating Towards R > L to the ankle    Pain Onset More than a month ago    Pain Frequency Occasional    Aggravating Factors  Stay in one position too long, poor posture or body mechanics    Pain Relieving Factors Change position and exercises    Effect of Pain on Daily Activities Balance and strength/endurance need continued attention.    Multiple Pain Sites No                [facility] PT Assessment - 02/14/21 0001       Posture/Postural Control   Posture/Postural Control Postural limitations    Postural Limitations Forward head;Rounded Shoulders;Decreased lumbar lordosis      ROM / Strength   AROM / PROM / Strength AROM      AROM   Overall AROM  Deficits    AROM Assessment Site Hip;Lumbar    Right/Left Hip Left;Right    Right Hip Flexion 105    Right Hip External Rotation  42    Right Hip Internal Rotation  12    Left Hip Flexion 110    Left Hip External Rotation  37    Left Hip Internal Rotation  10    Lumbar Extension 25      Flexibility   Soft Tissue Assessment /Muscle Length yes    Hamstrings 40 degrees B                           [facility] Adult PT Treatment/Exercise - 02/14/21 0001       Therapeutic Activites    Other Therapeutic Activities Tap-up on 8 inch step and dynamic heel to toe raises      Neuro Re-ed    Neuro Re-ed Details  Tandem balance eyes open, closed 3X 20 seconds each, standing marching 3 second holds, heel to toe raises in doorframe without hands      Exercises   Exercises Lumbar      Lumbar Exercises: Stretches   Figure 4 Stretch 5 reps;20 seconds      Lumbar Exercises: Standing   Scapular Retraction Strengthening;Both;10 reps;Limitations    Scapular Retraction Limitations 5 seconds    Other Standing Lumbar Exercises Trunk extension AROM 2 sets of 10X 3 seconds    Other Standing Lumbar Exercises  Alternating hip hike 2 sets of 10 for 3 seconds      Lumbar Exercises: Prone   Straight Leg Raise 10 reps;3 seconds;Limitations  Straight Leg Raises Limitations 2 sets prone alternating hip extension with ankles in dorsiflexion                    PT Education - 02/14/21 1559     Education Details Reviewed HEP with emphasis on increased compliance with hip abductors and lumbar strengthening.    Person(s) Educated Patient    Methods Explanation;Demonstration;Verbal cues    Comprehension Verbal cues required;Returned demonstration;Need further instruction;Verbalized understanding              PT Short Term Goals - 01/17/21 1628       PT SHORT TERM GOAL #1   Title be independent in initial HEP    Time 4    Period Weeks    Status Achieved    Target Date 01/22/21      PT SHORT TERM GOAL #2   Title -               PT Long Term Goals - 02/14/21 1610       PT LONG TERM GOAL #1   Title She will improve FOTO to at least 62% functional score    Baseline 59 at visit 8 (was 47, Goal 62 at visit 12)    Time 8    Period Weeks    Status On-going    Target Date 03/14/21      PT LONG TERM GOAL #2   Title She will reduce pain to overall less than 3/10 with usual activity    Baseline Can be > 3/10 with prolonged postures or fatigue    Time 4    Period Weeks    Status Partially Met      PT LONG TERM GOAL #3   Title 49/56 Berg    Baseline 42/56 Berg at original assessment, now 49/56 (Goal 48/56)    Time 4    Period Weeks    Status Achieved      PT LONG TERM GOAL #4   Title -      PT LONG TERM GOAL #5   Title -                   Plan - 02/14/21 1613     Clinical Impression Statement Leslie [name] has made very good overall progress with her physical therapy.  Her Berg Balance Score is now 49/56 (was 42/56) making her a reduced falls risk as compared to a month ago.  Pain and self-reported function have improved from evaluation.  Leslie [name] just returned  from a week vacation where walking on the beach and decreased HEP compliance reminded her she is not where she would like to be with her balance and endurance and strength.  With another 3-4 weeks of PT attendance and consistent HEP compliance, Leslie [name] should be ready for DC into independent PT.    Personal Factors and Comorbidities Comorbidity 3+    Comorbidities TDV:VOHYW disorder, Lt TKA,obesity, RTC tear, narcotic dependance.    Examination-Activity Limitations Bend;Lift;Locomotion Level;Carry;Sleep;Stairs;Stand;Transfers    Examination-Participation Restrictions Cleaning;Community Activity;Driving;Shop;Laundry    Stability/Clinical Decision Making Evolving/Moderate complexity    Rehab Potential Good    PT Frequency 2x / week   1-2   PT Duration 4 weeks    PT Treatment/Interventions ADLs/Self Care Home Management;Therapeutic activities;Therapeutic exercise;Neuromuscular re-education;Patient/family education;Manual techniques    PT Next Visit Plan Berg drills, dynamic balance, low back and hip abductors strength    PT Home Exercise Plan Access Code: [email or web address]    Consulted and  Agree with Plan of Care Patient             Patient will benefit from skilled therapeutic intervention in order to improve the following deficits and impairments:  Decreased activity tolerance, Abnormal gait, Decreased balance, Decreased endurance, Decreased strength, Difficulty walking, Increased muscle spasms, Postural dysfunction, Improper body mechanics, Pain, Obesity  Visit Diagnosis: Difficulty walking  Abnormal posture  Muscle weakness (generalized)  Chronic bilateral low back pain with bilateral sciatica  Unsteadiness on feet     Problem List Patient Active Problem List   Diagnosis Date Noted   Vitamin D deficiency 02/17/2020   Sleep disorder 07/10/2018   ADHD (attention deficit hyperactivity disorder) 10/01/2017   Unilateral primary osteoarthritis, right knee 08/20/2017   Chronic pain of  both shoulders 08/20/2017   Complete tear of right rotator cuff 04/29/2017   Osteoarthritis of left knee 06/16/2015   Status post total left knee replacement 06/16/2015   SUBSTANCE ABUSE 02/06/2009   Attention deficit disorder 09/14/2007   COLONIC POLYPS, HX OF 08/03/2007   Essential hypertension 01/20/2007   OBESITY NOS 12/04/2006   Allergic rhinitis 12/04/2006   GASTROJEJUNOSTOMY, HX OF 12/04/2006   BREAST BIOPSY, HX OF 12/04/2006    Farley Ly PT, MPT 02/14/2021, 4:19 PM  [facility] [address] [place], [place], [address] Phone: [phone]   Fax:  [phone]  Name: Leslie [name] MRN: [id number] Date of Birth: 1950-12-21

## 2021-02-16 ENCOUNTER — Encounter: Payer: Self-pay | Admitting: Rehabilitative and Restorative Service Providers"

## 2021-02-16 ENCOUNTER — Encounter: Payer: Medicare PPO | Admitting: Internal Medicine

## 2021-02-16 ENCOUNTER — Ambulatory Visit: Payer: Medicare PPO | Admitting: Rehabilitative and Restorative Service Providers"

## 2021-02-16 ENCOUNTER — Other Ambulatory Visit: Payer: Self-pay

## 2021-02-16 DIAGNOSIS — G8929 Other chronic pain: Secondary | ICD-10-CM

## 2021-02-16 DIAGNOSIS — R2681 Unsteadiness on feet: Secondary | ICD-10-CM

## 2021-02-16 DIAGNOSIS — R262 Difficulty in walking, not elsewhere classified: Secondary | ICD-10-CM | POA: Diagnosis not present

## 2021-02-16 DIAGNOSIS — M6281 Muscle weakness (generalized): Secondary | ICD-10-CM

## 2021-02-16 DIAGNOSIS — M5441 Lumbago with sciatica, right side: Secondary | ICD-10-CM

## 2021-02-16 DIAGNOSIS — M5442 Lumbago with sciatica, left side: Secondary | ICD-10-CM | POA: Diagnosis not present

## 2021-02-16 DIAGNOSIS — R293 Abnormal posture: Secondary | ICD-10-CM

## 2021-02-16 NOTE — Therapy (Addendum)
[facility] Physical Therapy [address] [place], [place], [address] Phone: [phone]   Fax:  [phone]  Physical Therapy Treatment /Discharge   Patient Details  Name: Leslie [name] MRN: [id number] Date of Birth: December 29, 1950 Referring Provider (PT): Carney Bern   Encounter Date: 02/16/2021   PT End of Session - 02/16/21 1550     Visit Number 9    Number of Visits 12    Date for PT Re-Evaluation 03/14/21    Authorization Type [employer]    Authorization - Visit Number 9    Authorization - Number of Visits 10    Progress Note Due on Visit 18    PT Start Time 1300    PT Stop Time 1341    PT Time Calculation (min) 41 min    Equipment Utilized During Treatment Gait belt    Activity Tolerance Patient tolerated treatment well;No increased pain;Patient limited by fatigue    Behavior During Therapy [facility] for tasks assessed/performed             Past Medical History:  Diagnosis Date   ADD (attention deficit disorder)    Allergy    Arthritis 2016   osteoarthritis left knee   Colon polyps    Hypertension    Left knee pain    chronic   Narcotic addiction ([place])    Obesity    post gastric bypass   Vitamin D deficiency     Past Surgical History:  Procedure Laterality Date   arthscopic knee surgery Left    several times   BREAST BIOPSY     BREAST SURGERY  years ago   breast biopsy   CHOLECYSTECTOMY     EYE SURGERY Bilateral 2013   Toric Lens implants   EYE SURGERY Right 2014   corneal amniotic membrane   GASTRIC BYPASS  2003   KIDNEY DONATION  2004   TOTAL KNEE ARTHROPLASTY Left 06/16/2015   Procedure: LEFT TOTAL KNEE ARTHROPLASTY;  Surgeon: Mcarthur Rossetti, MD;  Location: [facility];  Service: Orthopedics;  Laterality: Left;    There were no vitals filed for this visit.   Subjective Assessment - 02/16/21 1333     Subjective [name] reports better compliance with her spine strength over the past 2 days.  AM tingling to the R ankle is  present.  Once she gets up and moving it disappears.    Pertinent History ASN:KNLZJ disorder, Lt TKA, obesity, RTC tear    Limitations Lifting;Standing;Walking;House hold activities    How long can you stand comfortably? 1.5 - 2 hours (was 15 - 20 minutes)    How long can you walk comfortably? 25 minutes (was 5 - 10 minutes)    Diagnostic tests Imaging "2 views lumbar spine: Compared to films obtained 02/08/2020 no significant changes.  No acute fractures.  No spondylolisthesis.  Degenerative changes L4-5 and L5-S1.  Facet changes lower lumbar spine.  Arthrosclerosis aorta noted    Currently in Pain? No/denies    Pain Score 0-No pain    Pain Location Leg    Pain Orientation Left;Right    Pain Descriptors / Indicators Tingling    Pain Type Chronic pain    Pain Radiating Towards R > L to the ankle    Pain Onset More than a month ago    Pain Frequency Occasional    Aggravating Factors  1st thing in the morning, prolonged postures or poor mechanics    Pain Relieving Factors Change of position and exercises    Effect  of Pain on Daily Activities Balance and strength/endurance need work    Multiple Pain Sites No                               [facility] Adult PT Treatment/Exercise - 02/16/21 0001       Posture/Postural Control   Posture/Postural Control Postural limitations    Postural Limitations Forward head;Rounded Shoulders;Decreased lumbar lordosis      Therapeutic Activites    Other Therapeutic Activities Tap-up on 8 inch step and dynamic heel to toe raises      Neuro Re-ed    Neuro Re-ed Details  Tandem balance eyes open, closed 3X 20 seconds each, standing marching 3 second holds, heel to toe raises in doorframe without hands      Exercises   Exercises Lumbar      Lumbar Exercises: Stretches   Figure 4 Stretch 5 reps;20 seconds      Lumbar Exercises: Standing   Scapular Retraction Strengthening;Both;10 reps;Limitations    Scapular Retraction Limitations 5  seconds    Other Standing Lumbar Exercises Trunk extension AROM 2 sets of 10X 3 seconds    Other Standing Lumbar Exercises Alternating hip hike 2 sets of 10 for 3 seconds      Lumbar Exercises: Prone   Straight Leg Raise 10 reps;3 seconds;Limitations    Straight Leg Raises Limitations 2 sets prone alternating hip extension with ankles in dorsiflexion                      PT Short Term Goals - 01/17/21 1628       PT SHORT TERM GOAL #1   Title be independent in initial HEP    Time 4    Period Weeks    Status Achieved    Target Date 01/22/21      PT SHORT TERM GOAL #2   Title -               PT Long Term Goals - 02/14/21 1610       PT LONG TERM GOAL #1   Title She will improve FOTO to at least 62% functional score    Baseline 59 at visit 8 (was 47, Goal 62 at visit 12)    Time 8    Period Weeks    Status On-going    Target Date 03/14/21      PT LONG TERM GOAL #2   Title She will reduce pain to overall less than 3/10 with usual activity    Baseline Can be > 3/10 with prolonged postures or fatigue    Time 4    Period Weeks    Status Partially Met      PT LONG TERM GOAL #3   Title 49/56 Berg    Baseline 42/56 Berg at original assessment, now 49/56 (Goal 48/56)    Time 4    Period Weeks    Status Achieved      PT LONG TERM GOAL #4   Title -      PT LONG TERM GOAL #5   Title -                   Plan - 02/16/21 1551     Clinical Impression Statement Haeven reports good compliance with her HEP since her last PT visit 2 days ago.  Hip abductors weakness, balance and endurance remain high priorities with both her home and  clinic program.  She will benefit from the recommended course of PT.    Personal Factors and Comorbidities Comorbidity 3+    Comorbidities FGH:WEXHB disorder, Lt TKA,obesity, RTC tear, narcotic dependance.    Examination-Activity Limitations Bend;Lift;Locomotion Level;Carry;Sleep;Stairs;Stand;Transfers     Examination-Participation Restrictions Cleaning;Community Activity;Driving;Shop;Laundry    Stability/Clinical Decision Making Evolving/Moderate complexity    Rehab Potential Good    PT Frequency 2x / week   1-2   PT Duration 4 weeks    PT Treatment/Interventions ADLs/Self Care Home Management;Therapeutic activities;Therapeutic exercise;Neuromuscular re-education;Patient/family education;Manual techniques    PT Next Visit Plan Berg drills, dynamic balance, low back and hip abductors strength    PT Home Exercise Plan Access Code: [email or web address]    Consulted and Agree with Plan of Care Patient             Patient will benefit from skilled therapeutic intervention in order to improve the following deficits and impairments:  Decreased activity tolerance, Abnormal gait, Decreased balance, Decreased endurance, Decreased strength, Difficulty walking, Increased muscle spasms, Postural dysfunction, Improper body mechanics, Pain, Obesity  Visit Diagnosis: Difficulty walking  Abnormal posture  Muscle weakness (generalized)  Chronic bilateral low back pain with bilateral sciatica  Unsteadiness on feet     Problem List Patient Active Problem List   Diagnosis Date Noted   Vitamin D deficiency 02/17/2020   Sleep disorder 07/10/2018   ADHD (attention deficit hyperactivity disorder) 10/01/2017   Unilateral primary osteoarthritis, right knee 08/20/2017   Chronic pain of both shoulders 08/20/2017   Complete tear of right rotator cuff 04/29/2017   Osteoarthritis of left knee 06/16/2015   Status post total left knee replacement 06/16/2015   SUBSTANCE ABUSE 02/06/2009   Attention deficit disorder 09/14/2007   COLONIC POLYPS, HX OF 08/03/2007   Essential hypertension 01/20/2007   OBESITY NOS 12/04/2006   Allergic rhinitis 12/04/2006   GASTROJEJUNOSTOMY, HX OF 12/04/2006   BREAST BIOPSY, HX OF 12/04/2006    Farley Ly PT, MPT 02/16/2021, 3:54 PM  PHYSICAL THERAPY DISCHARGE SUMMARY  Visits  from Start of Care: 9  Current functional level related to goals / functional outcomes: See note   Remaining deficits: See note   Education / Equipment: HEP   Patient agrees to discharge. Patient goals were partially met. Patient is being discharged due to not returning since the last visit.  Scot Jun, PT, DPT, OCS, ATC 04/19/21  9:07 AM     [facility] Physical Therapy [address] [place], [place], [address] Phone: [phone]   Fax:  [phone]  Name: JIRAH RIDER MRN: [id number] Date of Birth: Aug 24, 1950

## 2021-02-16 NOTE — Patient Instructions (Signed)
Keep up with current HEP

## 2021-02-20 ENCOUNTER — Telehealth: Payer: Self-pay | Admitting: Internal Medicine

## 2021-02-20 DIAGNOSIS — F900 Attention-deficit hyperactivity disorder, predominantly inattentive type: Secondary | ICD-10-CM

## 2021-02-21 ENCOUNTER — Encounter: Payer: Self-pay | Admitting: Internal Medicine

## 2021-02-21 DIAGNOSIS — F900 Attention-deficit hyperactivity disorder, predominantly inattentive type: Secondary | ICD-10-CM

## 2021-02-21 DIAGNOSIS — G479 Sleep disorder, unspecified: Secondary | ICD-10-CM

## 2021-02-21 NOTE — Telephone Encounter (Signed)
Patient needs an office visit.  

## 2021-02-21 NOTE — Telephone Encounter (Signed)
PT would like a callback in regards to the Adderall as she would like to discuss this with the Dr or the nurse

## 2021-02-22 MED ORDER — AMPHETAMINE-DEXTROAMPHET ER 30 MG PO CP24: 30.0000 mg | ORAL_CAPSULE | ORAL | 0 refills | Status: DC

## 2021-02-22 MED ORDER — AMPHETAMINE-DEXTROAMPHETAMINE 20 MG PO TABS: ORAL_TABLET | ORAL | 0 refills | Status: DC

## 2021-02-22 NOTE — Telephone Encounter (Signed)
Message and refill sent by MyChart.

## 2021-02-28 ENCOUNTER — Encounter: Payer: Medicare PPO | Admitting: Rehabilitative and Restorative Service Providers"

## 2021-03-07 ENCOUNTER — Ambulatory Visit (INDEPENDENT_AMBULATORY_CARE_PROVIDER_SITE_OTHER): Payer: Medicare PPO

## 2021-03-07 ENCOUNTER — Ambulatory Visit: Payer: Medicare PPO | Admitting: Orthopaedic Surgery

## 2021-03-07 ENCOUNTER — Other Ambulatory Visit: Payer: Self-pay

## 2021-03-07 ENCOUNTER — Encounter: Payer: Medicare PPO | Admitting: Rehabilitative and Restorative Service Providers"

## 2021-03-07 DIAGNOSIS — M79604 Pain in right leg: Secondary | ICD-10-CM | POA: Diagnosis not present

## 2021-03-07 DIAGNOSIS — M1711 Unilateral primary osteoarthritis, right knee: Secondary | ICD-10-CM

## 2021-03-07 MED ORDER — METHYLPREDNISOLONE ACETATE 40 MG/ML IJ SUSP
40.0000 mg | INTRAMUSCULAR | Status: AC | PRN
  Administered 2021-03-07: 40 mg via INTRA_ARTICULAR

## 2021-03-07 MED ORDER — TRAMADOL HCL 50 MG PO TABS: 100.0000 mg | ORAL_TABLET | Freq: Four times a day (QID) | ORAL | 0 refills | Status: DC | PRN

## 2021-03-07 MED ORDER — LIDOCAINE HCL 1 % IJ SOLN
3.0000 mL | INTRAMUSCULAR | Status: AC | PRN
  Administered 2021-03-07: 3 mL

## 2021-03-07 NOTE — Progress Notes (Signed)
Office Visit Note   Patient: Leslie [name]           Date of Birth: 11/02/1950           MRN: [id number] Visit Date: 03/07/2021              Requested by: Philip Aspen, Limmie Patricia, MD [address] [place],  [place] [address] PCP: Philip Aspen, Limmie Patricia, MD   Assessment & Plan: Visit Diagnoses:  1. Pain in right leg   2. Unilateral primary osteoarthritis, right knee     Plan: I did speak with her again about the possibility of knee replacement surgery in the future.  Given how painful the surgery was for her for her left knee she is hesitant to have this done.  I did talk to her about this time having her consider a block and combined with spinal anesthesia.  She will think about it.  I did place a steroid injection in her right knee today which helped and I will send in a short-term prescription for tramadol.  All question concerns were answered addressed.  She knows to wait at least 3 to 4 months between steroid injections.  Follow-Up Instructions: Return if symptoms worsen or fail to improve.   Orders:  Orders Placed This Encounter  Procedures   Large Joint Inj   XR Tibia/Fibula Right   Meds ordered this encounter  Medications   traMADol (ULTRAM) 50 MG tablet    Sig: Take 2 tablets (100 mg total) by mouth every 6 (six) hours as needed.    Dispense:  40 tablet    Refill:  0      Procedures: Large Joint Inj: R knee on 03/07/2021 1:17 PM Indications: diagnostic evaluation and pain Details: 22 G 1.5 in needle, superolateral approach  Arthrogram: No  Medications: 3 mL lidocaine 1 %; 40 mg methylPREDNISolone acetate 40 MG/ML Outcome: tolerated well, no immediate complications Procedure, treatment alternatives, risks and benefits explained, specific risks discussed. Consent was given by the patient. Immediately prior to procedure a time out was called to verify the correct patient, procedure, equipment, support staff and site/side marked as required. Patient  was prepped and draped in the usual sterile fashion.      Clinical Data: No additional findings.   Subjective: Chief Complaint  Patient presents with   Right Knee - Pain  The patient is well-known to me.  We replaced her left knee in 2016.  She is an active 70 year old female has had a flareup of right knee pain.  We have had well-documented x-rays and evidence showing severe end-stage arthritis of the right knee.  This is now radiating into her tibia area and she would like to have x-rays of that today.  She still states that she is not really ready for knee replacement on the right side.  We saw her last for sciatica which has improved.  She denies any other acute change in her medical status.  HPI  Review of Systems She currently denies any headache, chest pain, shortness of breath, fever, chills, nausea, vomiting  Objective: Vital Signs: There were no vitals taken for this visit.  Physical Exam She is alert and orient x3 and in no acute distress Ortho Exam Examination of her right knee shows mild effusion.  There is varus malalignment and pain on the medial joint line and patellofemoral joint with significant crepitation throughout the arc of motion.  This does radiate into the proximal tibia area.  Her  ankle and foot exam are normal.  Her right knee is ligamentously stable. Specialty Comments:  No specialty comments available.  Imaging: XR Tibia/Fibula Right  Result Date: 03/07/2021 An AP and lateral of the right tibia and fibula showed no acute findings.  There is tricompartmental end-stage arthritis of the right knee.    PMFS History: Patient Active Problem List   Diagnosis Date Noted   Vitamin D deficiency 02/17/2020   Sleep disorder 07/10/2018   ADHD (attention deficit hyperactivity disorder) 10/01/2017   Unilateral primary osteoarthritis, right knee 08/20/2017   Chronic pain of both shoulders 08/20/2017   Complete tear of right rotator cuff 04/29/2017    Osteoarthritis of left knee 06/16/2015   Status post total left knee replacement 06/16/2015   SUBSTANCE ABUSE 02/06/2009   Attention deficit disorder 09/14/2007   COLONIC POLYPS, HX OF 08/03/2007   Essential hypertension 01/20/2007   OBESITY NOS 12/04/2006   Allergic rhinitis 12/04/2006   GASTROJEJUNOSTOMY, HX OF 12/04/2006   BREAST BIOPSY, HX OF 12/04/2006   Past Medical History:  Diagnosis Date   ADD (attention deficit disorder)    Allergy    Arthritis 2016   osteoarthritis left knee   Colon polyps    Hypertension    Left knee pain    chronic   Narcotic addiction ([facility])    Obesity    post gastric bypass   Vitamin D deficiency     Family History  Problem Relation Age of Onset   Heart disease Mother        post CABG history of CHF   COPD Father    Diabetes Sister    Hypertension Sister        post renal transplant   Heart disease Brother        CAD    Past Surgical History:  Procedure Laterality Date   arthscopic knee surgery Left    several times   BREAST BIOPSY     BREAST SURGERY  years ago   breast biopsy   CHOLECYSTECTOMY     EYE SURGERY Bilateral 2013   Toric Lens implants   EYE SURGERY Right 2014   corneal amniotic membrane   GASTRIC BYPASS  2003   KIDNEY DONATION  2004   TOTAL KNEE ARTHROPLASTY Left 06/16/2015   Procedure: LEFT TOTAL KNEE ARTHROPLASTY;  Surgeon: Kathryne Hitch, MD;  Location: [facility];  Service: Orthopedics;  Laterality: Left;   Social History   Occupational History   Not on file  Tobacco Use   Smoking status: Former    Types: Cigarettes    Quit date: 07/22/1992    Years since quitting: 28.6   Smokeless tobacco: Never  Substance and Sexual Activity   Alcohol use: Yes    Comment: once every 6 months   Drug use: No   Sexual activity: Not on file

## 2021-03-09 ENCOUNTER — Encounter: Payer: Medicare PPO | Admitting: Rehabilitative and Restorative Service Providers"

## 2021-03-12 ENCOUNTER — Encounter: Payer: Self-pay | Admitting: Orthopaedic Surgery

## 2021-03-13 ENCOUNTER — Ambulatory Visit (INDEPENDENT_AMBULATORY_CARE_PROVIDER_SITE_OTHER): Payer: Medicare PPO | Admitting: Internal Medicine

## 2021-03-13 ENCOUNTER — Encounter: Payer: Self-pay | Admitting: Internal Medicine

## 2021-03-13 ENCOUNTER — Other Ambulatory Visit: Payer: Self-pay

## 2021-03-13 VITALS — BP 130/80 | HR 72 | Temp 97.9°F | Ht 64.0 in | Wt 202.1 lb

## 2021-03-13 DIAGNOSIS — M1711 Unilateral primary osteoarthritis, right knee: Secondary | ICD-10-CM | POA: Diagnosis not present

## 2021-03-13 DIAGNOSIS — Z0000 Encounter for general adult medical examination without abnormal findings: Secondary | ICD-10-CM | POA: Diagnosis not present

## 2021-03-13 DIAGNOSIS — E559 Vitamin D deficiency, unspecified: Secondary | ICD-10-CM

## 2021-03-13 DIAGNOSIS — Z1211 Encounter for screening for malignant neoplasm of colon: Secondary | ICD-10-CM

## 2021-03-13 DIAGNOSIS — I10 Essential (primary) hypertension: Secondary | ICD-10-CM | POA: Diagnosis not present

## 2021-03-13 DIAGNOSIS — Z124 Encounter for screening for malignant neoplasm of cervix: Secondary | ICD-10-CM

## 2021-03-13 DIAGNOSIS — G479 Sleep disorder, unspecified: Secondary | ICD-10-CM | POA: Diagnosis not present

## 2021-03-13 DIAGNOSIS — F900 Attention-deficit hyperactivity disorder, predominantly inattentive type: Secondary | ICD-10-CM

## 2021-03-13 DIAGNOSIS — Z1239 Encounter for other screening for malignant neoplasm of breast: Secondary | ICD-10-CM | POA: Diagnosis not present

## 2021-03-13 DIAGNOSIS — Z13820 Encounter for screening for osteoporosis: Secondary | ICD-10-CM

## 2021-03-13 DIAGNOSIS — Z780 Asymptomatic menopausal state: Secondary | ICD-10-CM

## 2021-03-13 LAB — VITAMIN B12: Vitamin B-12: 196 pg/mL — ABNORMAL LOW (ref 211–911)

## 2021-03-13 LAB — LIPID PANEL
Cholesterol: 217 mg/dL — ABNORMAL HIGH (ref 0–200)
HDL: 112.3 mg/dL (ref >39.00)
LDL Cholesterol: 95 mg/dL (ref 0–99)
NonHDL: 104.25
Total CHOL/HDL Ratio: 2
Triglycerides: 46 mg/dL (ref 0.0–149.0)
VLDL: 9.2 mg/dL (ref 0.0–40.0)

## 2021-03-13 LAB — CBC WITH DIFFERENTIAL/PLATELET
Basophils Absolute: 0 10*3/uL (ref 0.0–0.1)
Basophils Relative: 0.5 % (ref 0.0–3.0)
Eosinophils Absolute: 0.1 10*3/uL (ref 0.0–0.7)
Eosinophils Relative: 2.8 % (ref 0.0–5.0)
HCT: 37.6 % (ref 36.0–46.0)
Hemoglobin: 12.5 g/dL (ref 12.0–15.0)
Lymphocytes Relative: 13.5 % (ref 12.0–46.0)
Lymphs Abs: 0.6 10*3/uL — ABNORMAL LOW (ref 0.7–4.0)
MCHC: 33.3 g/dL (ref 30.0–36.0)
MCV: 102.1 fl — ABNORMAL HIGH (ref 78.0–100.0)
Monocytes Absolute: 0.4 10*3/uL (ref 0.1–1.0)
Monocytes Relative: 7.6 % (ref 3.0–12.0)
Neutro Abs: 3.5 10*3/uL (ref 1.4–7.7)
Neutrophils Relative %: 75.6 % (ref 43.0–77.0)
Platelets: 242 10*3/uL (ref 150.0–400.0)
RBC: 3.68 Mil/uL — ABNORMAL LOW (ref 3.87–5.11)
RDW: 13.3 % (ref 11.5–15.5)
WBC: 4.6 10*3/uL (ref 4.0–10.5)

## 2021-03-13 LAB — COMPREHENSIVE METABOLIC PANEL
ALT: 30 U/L (ref 0–35)
AST: 25 U/L (ref 0–37)
Albumin: 4.3 g/dL (ref 3.5–5.2)
Alkaline Phosphatase: 96 U/L (ref 39–117)
BUN: 30 mg/dL — ABNORMAL HIGH (ref 6–23)
CO2: 26 mEq/L (ref 19–32)
Calcium: 10 mg/dL (ref 8.4–10.5)
Chloride: 93 mEq/L — ABNORMAL LOW (ref 96–112)
Creatinine, Ser: 1.17 mg/dL (ref 0.40–1.20)
GFR: 47.21 mL/min — ABNORMAL LOW (ref >60.00)
Glucose, Bld: 73 mg/dL (ref 70–99)
Potassium: 4.1 mEq/L (ref 3.5–5.1)
Sodium: 131 mEq/L — ABNORMAL LOW (ref 135–145)
Total Bilirubin: 0.6 mg/dL (ref 0.2–1.2)
Total Protein: 6.6 g/dL (ref 6.0–8.3)

## 2021-03-13 LAB — TSH: TSH: 1.78 u[IU]/mL (ref 0.35–5.50)

## 2021-03-13 LAB — VITAMIN D 25 HYDROXY (VIT D DEFICIENCY, FRACTURES): VITD: 25.17 ng/mL — ABNORMAL LOW (ref 30.00–100.00)

## 2021-03-13 LAB — HEMOGLOBIN A1C: Hgb A1c MFr Bld: 5.3 % (ref 4.6–6.5)

## 2021-03-13 MED ORDER — ALPRAZOLAM 0.25 MG PO TABS: ORAL_TABLET | ORAL | 2 refills | Status: DC

## 2021-03-13 MED ORDER — ZOLPIDEM TARTRATE 10 MG PO TABS: 10.0000 mg | ORAL_TABLET | Freq: Every day | ORAL | 2 refills | Status: DC

## 2021-03-13 NOTE — Progress Notes (Signed)
Established Patient Office Visit     This visit occurred during the SARS-CoV-2 public health emergency.  Safety protocols were in place, including screening questions prior to the visit, additional usage of staff PPE, and extensive cleaning of exam room while observing appropriate contact time as indicated for disinfecting solutions.    CC/Reason for Visit: Annual preventive exam and subsequent Medicare wellness visit  HPI: Leslie [name] is a 70 y.o. female who is coming in today for the above mentioned reasons. Past Medical History is significant for: Hypertension, depression and ADHD.  Her significant other passed away in 01-04-2022and she has had a hard time recovering.  She has been seeing a grief counselor through hospice.  She has routine eye and dental care.  She is overdue for all cancer screening, all immunizations are up-to-date.  She seems very emotional today.   Past Medical/Surgical History: Past Medical History:  Diagnosis Date   ADD (attention deficit disorder)    Allergy    Arthritis 2016   osteoarthritis left knee   Colon polyps    Hypertension    Left knee pain    chronic   Narcotic addiction ([facility])    Obesity    post gastric bypass   Vitamin D deficiency     Past Surgical History:  Procedure Laterality Date   arthscopic knee surgery Left    several times   BREAST BIOPSY     BREAST SURGERY  years ago   breast biopsy   CHOLECYSTECTOMY     EYE SURGERY Bilateral 2013   Toric Lens implants   EYE SURGERY Right 2014   corneal amniotic membrane   GASTRIC BYPASS  2003   KIDNEY DONATION  2004   TOTAL KNEE ARTHROPLASTY Left 06/16/2015   Procedure: LEFT TOTAL KNEE ARTHROPLASTY;  Surgeon: Kathryne Hitch, MD;  Location: [facility];  Service: Orthopedics;  Laterality: Left;    Social History:  reports that she quit smoking about 28 years ago. Her smoking use included cigarettes. She has never used smokeless tobacco. She reports current alcohol  use. She reports that she does not use drugs.  Allergies: Allergies  Allergen Reactions   Amoxicillin     Hives   Sulfamethoxazole Nausea And Vomiting    See patient list of med intolerances due to h/o gastric bypass and nephrectomy    Family History:  Family History  Problem Relation Age of Onset   Heart disease Mother        post CABG history of CHF   COPD Father    Diabetes Sister    Hypertension Sister        post renal transplant   Heart disease Brother        CAD     Current Outpatient Medications:    ALPRAZolam (XANAX) 0.25 MG tablet, TAKE 1 TABLET(0.25 MG) BY MOUTH AT BEDTIME AS NEEDED FOR ANXIETY, Disp: 30 tablet, Rfl: 2   amphetamine-dextroamphetamine (ADDERALL XR) 30 MG 24 hr capsule, Take 1 capsule (30 mg total) by mouth every morning., Disp: 30 capsule, Rfl: 0   amphetamine-dextroamphetamine (ADDERALL XR) 30 MG 24 hr capsule, Take 1 capsule (30 mg total) by mouth every morning. FILL IN ONE MONTH, Disp: 30 capsule, Rfl: 0   amphetamine-dextroamphetamine (ADDERALL XR) 30 MG 24 hr capsule, Take 1 capsule (30 mg total) by mouth every morning., Disp: 30 capsule, Rfl: 0   amphetamine-dextroamphetamine (ADDERALL) 20 MG tablet, Take 1/2 to 1 tablet by mouth every evening if  needed., Disp: 30 tablet, Rfl: 0   amphetamine-dextroamphetamine (ADDERALL) 20 MG tablet, take 1/2 to 1 tablet by mouth every evening if needed, Disp: 30 tablet, Rfl: 0   amphetamine-dextroamphetamine (ADDERALL) 20 MG tablet, Take 1/2 to 1 tablet by mouth every evening if needed., Disp: 30 tablet, Rfl: 0   Ascorbic Acid (VITAMIN C) 1000 MG tablet, Take 1,000 mg by mouth daily. , Disp: , Rfl:    Calcium Carb-Cholecalciferol (OYSTER SHELL CALCIUM) 500-400 MG-UNIT TABS, Take by mouth., Disp: , Rfl:    FLUoxetine (PROZAC) 40 MG capsule, TAKE 1 CAPSULE(40 MG) BY MOUTH EVERY MORNING, Disp: 90 capsule, Rfl: 1   hydrochlorothiazide (HYDRODIURIL) 25 MG tablet, TAKE 1 TABLET(25 MG) BY MOUTH DAILY, Disp: 90 tablet,  Rfl: 1   lamoTRIgine (LAMICTAL) 100 MG tablet, TAKE 1 TABLET(100 MG) BY MOUTH TWICE DAILY, Disp: 180 tablet, Rfl: 1   tiZANidine (ZANAFLEX) 2 MG tablet, TAKE 1 TABLET(2 MG) BY MOUTH THREE TIMES DAILY, Disp: 90 tablet, Rfl: 0   traMADol (ULTRAM) 50 MG tablet, Take 2 tablets (100 mg total) by mouth every 6 (six) hours as needed., Disp: 40 tablet, Rfl: 0   triamcinolone (NASACORT) 55 MCG/ACT AERO nasal inhaler, Place 2 sprays into the nose daily., Disp: 1 Inhaler, Rfl: 3   zolpidem (AMBIEN) 10 MG tablet, Take 1 tablet (10 mg total) by mouth at bedtime., Disp: 30 tablet, Rfl: 2  Review of Systems:  Constitutional: Denies fever, chills, diaphoresis, appetite change and fatigue.  HEENT: Denies photophobia, eye pain, redness, hearing loss, ear pain, congestion, sore throat, rhinorrhea, sneezing, mouth sores, trouble swallowing, neck pain, neck stiffness and tinnitus.   Respiratory: Denies SOB, DOE, cough, chest tightness,  and wheezing.   Cardiovascular: Denies chest pain, palpitations and leg swelling.  Gastrointestinal: Denies nausea, vomiting, abdominal pain, diarrhea, constipation, blood in stool and abdominal distention.  Genitourinary: Denies dysuria, urgency, frequency, hematuria, flank pain and difficulty urinating.  Endocrine: Denies: hot or cold intolerance, sweats, changes in hair or nails, polyuria, polydipsia. Musculoskeletal: Denies myalgias, back pain, joint swelling, arthralgias and gait problem.  Skin: Denies pallor, rash and wound.  Neurological: Denies dizziness, seizures, syncope, weakness, light-headedness, numbness and headaches.  Hematological: Denies adenopathy. Easy bruising, personal or family bleeding history  Psychiatric/Behavioral: Denies suicidal ideation, , confusion, nervousness,  and agitation    Physical Exam: Vitals:   03/13/21 1411  BP: 130/80  Pulse: 72  Temp: 97.9 F (36.6 C)  TempSrc: Oral  SpO2: 97%  Weight: 202 lb 1.6 oz (91.7 kg)  Height: 5\' 4"   (1.626 m)    Body mass index is 34.69 kg/m.   Constitutional: NAD, calm, comfortable Eyes: PERRL, lids and conjunctivae normal ENMT: Mucous membranes are moist. Posterior pharynx clear of any exudate or lesions. Normal dentition. Tympanic membrane is pearly white, no erythema or bulging. Neck: normal, supple, no masses, no thyromegaly Respiratory: clear to auscultation bilaterally, no wheezing, no crackles. Normal respiratory effort. No accessory muscle use.  Cardiovascular: Regular rate and rhythm, no murmurs / rubs / gallops. No extremity edema. 2+ pedal pulses. No carotid bruits.  Abdomen: no tenderness, no masses palpated. No hepatosplenomegaly. Bowel sounds positive.  Musculoskeletal: no clubbing / cyanosis. No joint deformity upper and lower extremities. Good ROM, no contractures. Normal muscle tone.  Skin: no rashes, lesions, ulcers. No induration Neurologic: CN 2-12 grossly intact. Sensation intact, DTR normal. Strength 5/5 in all 4.  Psychiatric: Normal judgment and insight. Alert and oriented x 3. Normal mood.    Subsequent Medicare wellness visit  1. Risk factors, based on past  M,S,F -cardiovascular disease risk factors include age, obesity, history of hypertension   2.  Physical activities: Sedentary   3.  Depression/mood: She does have depressed mood   4.  Hearing: No perceived issues   5.  ADL's: Independent in all ADLs   6.  Fall risk: Low fall risk   7.  Home safety: No problems identified   8.  Height weight, and visual acuity: height and weight as above, vision:  Vision Screening   Right eye Left eye Both eyes  Without correction     With correction 20/25 20/25 20/25      9.  Counseling: Advise she update her cancer screenings.   10. Lab orders based on risk factors: Laboratory update will be reviewed   11. Referral : Referral to GI, GYN and for mammogram   12. Care plan: Follow-up with me in 3 months   13. Cognitive assessment: No cognitive  impairment   14. Screening: Patient provided with a written and personalized 5-10 year screening schedule in the AVS. yes   15. Provider List Update: PCP only  16. Advance Directives: Full code   17. Opioids: Patient is not on any opioid prescriptions and has no risk factors for a substance use disorder.   Flowsheet Row Office Visit from 02/16/2020 in [facility] [place]  PHQ-9 Total Score 1       Fall Risk  02/16/2020 11/10/2019 01/05/2018 11/08/2016 07/26/2016  Falls in the past year? 1 0 No No No  Number falls in past yr: 0 0 - - -  Injury with Fall? 1 0 - - -  Risk for fall due to : - - - - -  Risk for fall due to: Comment - - - - -     Impression and Plan:  Encounter for preventive health examination -She has routine eye and dental care. -All immunizations are up-to-date. -Screening labs today. -Healthy lifestyle discussed in detail. -She is overdue for all age-appropriate cancer screening, will order. Request DEXA scan.  Sleep disorder  - Plan: ALPRAZolam (XANAX) 0.25 MG tablet, zolpidem (AMBIEN) 10 MG tablet  Vitamin D deficiency  - Plan: VITAMIN D 25 Hydroxy (Vit-D Deficiency, Fractures)  Attention deficit hyperactivity disorder (ADHD), predominantly inattentive type -On Adderall, not requesting refills today.  Essential hypertension  -Fair control today.  Continue hydrochlorothiazide.  Unilateral primary osteoarthritis, right knee -Followed by orthopedics, had a recent knee injection.   Patient Instructions  -Nice seeing you today!!  -Lab work today; will notify you once results are available.  -You are due for colonoscopy, mammogram and pap smear. We will arrange for you to get these.  -Schedule follow up in 3 months. Can be virtual.    09/23/2016, MD  Primary Care at [facility]

## 2021-03-13 NOTE — Addendum Note (Signed)
Addended by: Kandra Nicolas on: 03/13/2021 03:01 PM   Modules accepted: Orders

## 2021-03-13 NOTE — Patient Instructions (Signed)
-  Nice seeing you today!!  -Lab work today; will notify you once results are available.  -You are due for colonoscopy, mammogram and pap smear. We will arrange for you to get these.  -Schedule follow up in 3 months. Can be virtual.

## 2021-03-14 ENCOUNTER — Encounter: Payer: Self-pay | Admitting: Internal Medicine

## 2021-03-14 ENCOUNTER — Other Ambulatory Visit: Payer: Self-pay | Admitting: Internal Medicine

## 2021-03-14 ENCOUNTER — Encounter: Payer: Medicare PPO | Admitting: Physical Therapy

## 2021-03-14 DIAGNOSIS — D7589 Other specified diseases of blood and blood-forming organs: Secondary | ICD-10-CM | POA: Insufficient documentation

## 2021-03-14 DIAGNOSIS — E559 Vitamin D deficiency, unspecified: Secondary | ICD-10-CM

## 2021-03-14 DIAGNOSIS — E538 Deficiency of other specified B group vitamins: Secondary | ICD-10-CM | POA: Insufficient documentation

## 2021-03-14 MED ORDER — VITAMIN D (ERGOCALCIFEROL) 1.25 MG (50000 UNIT) PO CAPS: 50000.0000 [IU] | ORAL_CAPSULE | ORAL | 0 refills | Status: DC

## 2021-03-15 ENCOUNTER — Encounter: Payer: Self-pay | Admitting: Internal Medicine

## 2021-03-15 ENCOUNTER — Other Ambulatory Visit: Payer: Self-pay | Admitting: Internal Medicine

## 2021-03-15 DIAGNOSIS — E559 Vitamin D deficiency, unspecified: Secondary | ICD-10-CM

## 2021-03-15 DIAGNOSIS — D7589 Other specified diseases of blood and blood-forming organs: Secondary | ICD-10-CM

## 2021-03-15 DIAGNOSIS — R899 Unspecified abnormal finding in specimens from other organs, systems and tissues: Secondary | ICD-10-CM

## 2021-03-16 ENCOUNTER — Ambulatory Visit (INDEPENDENT_AMBULATORY_CARE_PROVIDER_SITE_OTHER): Payer: Medicare PPO | Admitting: *Deleted

## 2021-03-16 ENCOUNTER — Other Ambulatory Visit: Payer: Self-pay

## 2021-03-16 DIAGNOSIS — E538 Deficiency of other specified B group vitamins: Secondary | ICD-10-CM | POA: Diagnosis not present

## 2021-03-16 MED ORDER — CYANOCOBALAMIN 1000 MCG/ML IJ SOLN
1000.0000 ug | Freq: Once | INTRAMUSCULAR | Status: AC
  Administered 2021-03-16: 1000 ug via INTRAMUSCULAR

## 2021-03-16 NOTE — Progress Notes (Signed)
Per orders of Dr. Hernandez, injection of B12 given by Correy Weidner. Patient tolerated injection well.  

## 2021-03-21 ENCOUNTER — Other Ambulatory Visit: Payer: Self-pay | Admitting: Orthopaedic Surgery

## 2021-03-21 ENCOUNTER — Encounter: Payer: Self-pay | Admitting: Orthopaedic Surgery

## 2021-03-21 ENCOUNTER — Encounter: Payer: Self-pay | Admitting: Internal Medicine

## 2021-03-21 DIAGNOSIS — F900 Attention-deficit hyperactivity disorder, predominantly inattentive type: Secondary | ICD-10-CM

## 2021-03-21 MED ORDER — ACETAMINOPHEN-CODEINE #3 300-30 MG PO TABS: 1.0000 | ORAL_TABLET | Freq: Three times a day (TID) | ORAL | 0 refills | Status: DC | PRN

## 2021-03-22 MED ORDER — AMPHETAMINE-DEXTROAMPHETAMINE 20 MG PO TABS: ORAL_TABLET | ORAL | 0 refills | Status: DC

## 2021-03-22 MED ORDER — AMPHETAMINE-DEXTROAMPHET ER 30 MG PO CP24: 30.0000 mg | ORAL_CAPSULE | ORAL | 0 refills | Status: DC

## 2021-03-23 ENCOUNTER — Encounter: Payer: Self-pay | Admitting: Orthopaedic Surgery

## 2021-03-27 ENCOUNTER — Other Ambulatory Visit: Payer: Self-pay | Admitting: Orthopaedic Surgery

## 2021-03-27 ENCOUNTER — Encounter: Payer: Self-pay | Admitting: Orthopaedic Surgery

## 2021-03-27 MED ORDER — GABAPENTIN 100 MG PO CAPS: 100.0000 mg | ORAL_CAPSULE | Freq: Three times a day (TID) | ORAL | 1 refills | Status: DC

## 2021-03-28 NOTE — Telephone Encounter (Signed)
FYI

## 2021-04-17 ENCOUNTER — Other Ambulatory Visit: Payer: Self-pay

## 2021-04-17 ENCOUNTER — Other Ambulatory Visit: Payer: Self-pay | Admitting: Internal Medicine

## 2021-04-17 DIAGNOSIS — F339 Major depressive disorder, recurrent, unspecified: Secondary | ICD-10-CM

## 2021-04-19 ENCOUNTER — Other Ambulatory Visit: Payer: Self-pay | Admitting: Physician Assistant

## 2021-04-24 NOTE — Progress Notes (Addendum)
COVID swab appointment:  05-02-21  COVID Vaccine Completed:  Yes x4 Date COVID Vaccine completed:  08-28-19, 09-20-19 Has received booster:  Yes x2 03-15-20, 12-14-20 COVID vaccine manufacturer: Pfizer    Date of COVID positive in last 90 days: N/A  PCP - Chaya Jan, MD Cardiologist - N/A  Chest x-ray - N/A EKG - 04-26-21 Epic Stress Test - N/A ECHO - N/A Cardiac Cath - N/A Pacemaker/ICD device last checked: Spinal Cord Stimulator:  Sleep Study - N/A CPAP -   Fasting Blood Sugar - N/A Checks Blood Sugar _____ times a day  Blood Thinner Instructions:N/A Aspirin Instructions: Last Dose:  Activity level:  Can go up a flight of stairs and perform activities of daily living without stopping and without symptoms of chest pain or shortness of breath.    Anesthesia review:  N/A  Patient denies shortness of breath, fever, cough and chest pain at PAT appointment   Patient verbalized understanding of instructions that were given to them at the PAT appointment. Patient was also instructed that they will need to review over the PAT instructions again at home before surgery.

## 2021-04-24 NOTE — Patient Instructions (Addendum)
DUE TO COVID-19 ONLY ONE VISITOR IS ALLOWED TO COME WITH YOU AND STAY IN THE WAITING ROOM ONLY DURING PRE OP AND PROCEDURE.   **NO VISITORS ARE ALLOWED IN THE SHORT STAY AREA OR RECOVERY ROOM!!**   DUE TO COVID-19 ONLY ONE VISITOR IS ALLOWED TO COME WITH YOU AND STAY IN THE WAITING ROOM ONLY DURING PRE OP AND PROCEDURE.   **NO VISITORS ARE ALLOWED IN THE SHORT STAY AREA OR RECOVERY ROOM!!**  IF YOU WILL BE ADMITTED INTO THE HOSPITAL YOU ARE ALLOWED ONLY TWO SUPPORT PEOPLE DURING VISITATION HOURS ONLY (7 AM -8PM)    Up to two visitors ages 51+ are allowed at one in a patient's room.  The visitors may rotate out with other people throughout the day.  Additionally, up to two children between the ages of 32 and 30 are allowed and do not count toward the number of allowed visitors.  Children within this age range must be accompanied by an adult visitor.  One adult visitor may remain with the patient overnight and must be in the room by 8 PM.   COVID SWAB TESTING MUST BE COMPLETED ON:  Wednesday, 05-02-21, between the hours of 8 and 3  **MUST PRESENT COMPLETED FORM AT TESTING SITE**    [address] [place] (backside of the building)  You are not required to quarantine, however you are required to wear a well-fitted mask when you are out and around people not in your household.  Hand Hygiene often Do NOT share personal items Notify your provider if you are in close contact with someone who has COVID or you develop fever 100.4 or greater, new onset of sneezing, cough, sore throat, shortness of breath or body aches.         Your procedure is scheduled on:  Friday, 05-04-21   Report to [facility] Main  Entrance    Report to admitting at 10:45 AM   Call this number if you have problems the morning of surgery [phone]   Do not eat food :After Midnight.   May have liquids until 10:30 AM day of surgery  CLEAR LIQUID DIET  Foods Allowed                                                                      Foods Excluded  Water, Black Coffee (no milk/no creamer) and tea, regular and decaf                              liquids that you cannot  Plain Jell-O in any flavor  (No red)                         see through such as: Fruit ices (not with fruit pulp)                                 milk, soups, orange juice  Iced Popsicles (No red)  All solid food                             Apple juices Sports drinks like Gatorade (No red) Lightly seasoned clear broth or consume(fat free) Sugar   Complete one Ensure drink the morning of surgery at 10:30 AM  the day of surgery.       The day of surgery:  Drink ONE (1) Pre-Surgery Clear Ensure the morning of surgery. Drink in one sitting. Do not sip.  This drink was given to you during your hospital  pre-op appointment visit. Nothing else to drink after completing the Pre-Surgery Clear Ensure           If you have questions, please contact your surgeon's office.     Oral Hygiene is also important to reduce your risk of infection.                                    Remember - BRUSH YOUR TEETH THE MORNING OF SURGERY WITH YOUR REGULAR TOOTHPASTE   Do NOT smoke after Midnight   Take these medicines the morning of surgery with A SIP OF WATER:  Fluoxetine, Gabapentin, Lamotrigine                    Stop all vitamins and herbal supplements a week before surgery             You may not have any metal on your body including hair pins, jewelry, and body piercing             Do not wear make-up, lotions, powders, perfumes or deodorant  Do not wear nail polish including gel and S&S, artificial/acrylic nails, or any other type of covering on natural nails including finger and toenails. If you have artificial nails, gel coating, etc. that needs to be removed by a nail salon please have this removed prior to surgery or surgery may need to be canceled/ delayed if the surgeon/ anesthesia  feels like they are unable to be safely monitored.   Do not shave  48 hours prior to surgery.   Do not bring valuables to the hospital. [facility] IS NOT RESPONSIBLE FOR VALUABLES.   Contacts, dentures or bridgework may not be worn into surgery.   Bring small overnight bag day of surgery.  Please read over the following fact sheets you were given: IF YOU HAVE QUESTIONS ABOUT YOUR PRE OP INSTRUCTIONS PLEASE CALL [phone] [facility] - Preparing for Surgery Before surgery, you can play an important role.  Because skin is not sterile, your skin needs to be as free of germs as possible.  You can reduce the number of germs on your skin by washing with CHG (chlorahexidine gluconate) soap before surgery.  CHG is an antiseptic cleaner which kills germs and bonds with the skin to continue killing germs even after washing. Please DO NOT use if you have an allergy to CHG or antibacterial soaps.  If your skin becomes reddened/irritated stop using the CHG and inform your nurse when you arrive at Short Stay. Do not shave (including legs and underarms) for at least 48 hours prior to the first CHG shower.  You may shave your face/neck.  Please follow these instructions carefully:  1.  Shower with CHG Soap the night before surgery and the  morning of surgery.  2.  If you choose to wash your hair, wash your hair first as usual with your normal  shampoo.  3.  After you shampoo, rinse your hair and body thoroughly to remove the shampoo.                             4.  Use CHG as you would any other liquid soap.  You can apply chg directly to the skin and wash.  Gently with a scrungie or clean washcloth.  5.  Apply the CHG Soap to your body ONLY FROM THE NECK DOWN.   Do   not use on face/ open                           Wound or open sores. Avoid contact with eyes, ears mouth and   genitals (private parts).                       Wash face,  Genitals (private parts) with your normal soap.              6.  Wash thoroughly, paying special attention to the area where your    surgery  will be performed.  7.  Thoroughly rinse your body with warm water from the neck down.  8.  DO NOT shower/wash with your normal soap after using and rinsing off the CHG Soap.                9.  Pat yourself dry with a clean towel.            10.  Wear clean pajamas.            11.  Place clean sheets on your bed the night of your first shower and do not  sleep with pets. Day of Surgery : Do not apply any lotions/deodorants the morning of surgery.  Please wear clean clothes to the hospital/surgery center.  FAILURE TO FOLLOW THESE INSTRUCTIONS MAY RESULT IN THE CANCELLATION OF YOUR SURGERY  PATIENT SIGNATURE_________________________________  NURSE SIGNATURE__________________________________  ________________________________________________________________________   Rogelia Mire  An incentive spirometer is a tool that can help keep your lungs clear and active. This tool measures how well you are filling your lungs with each breath. Taking long deep breaths may help reverse or decrease the chance of developing breathing (pulmonary) problems (especially infection) following: A long period of time when you are unable to move or be active. BEFORE THE PROCEDURE  If the spirometer includes an indicator to show your best effort, your nurse or respiratory therapist will set it to a desired goal. If possible, sit up straight or lean slightly forward. Try not to slouch. Hold the incentive spirometer in an upright position. INSTRUCTIONS FOR USE  Sit on the edge of your bed if possible, or sit up as far as you can in bed or on a chair. Hold the incentive spirometer in an upright position. Breathe out normally. Place the mouthpiece in your mouth and seal your lips tightly around it. Breathe in slowly and as deeply as possible, raising the piston or the ball toward the top of the column. Hold your breath for 3-5  seconds or for as long as possible. Allow the piston or ball to fall to the bottom of the column. Remove the mouthpiece from your mouth and breathe out normally. Rest for a few seconds  and repeat Steps 1 through 7 at least 10 times every 1-2 hours when you are awake. Take your time and take a few normal breaths between deep breaths. The spirometer may include an indicator to show your best effort. Use the indicator as a goal to work toward during each repetition. After each set of 10 deep breaths, practice coughing to be sure your lungs are clear. If you have an incision (the cut made at the time of surgery), support your incision when coughing by placing a pillow or rolled up towels firmly against it. Once you are able to get out of bed, walk around indoors and cough well. You may stop using the incentive spirometer when instructed by your caregiver.  RISKS AND COMPLICATIONS Take your time so you do not get dizzy or light-headed. If you are in pain, you may need to take or ask for pain medication before doing incentive spirometry. It is harder to take a deep breath if you are having pain. AFTER USE Rest and breathe slowly and easily. It can be helpful to keep track of a log of your progress. Your caregiver can provide you with a simple table to help with this. If you are using the spirometer at home, follow these instructions: SEEK MEDICAL CARE IF:  You are having difficultly using the spirometer. You have trouble using the spirometer as often as instructed. Your pain medication is not giving enough relief while using the spirometer. You develop fever of 100.5 F (38.1 C) or higher. SEEK IMMEDIATE MEDICAL CARE IF:  You cough up bloody sputum that had not been present before. You develop fever of 102 F (38.9 C) or greater. You develop worsening pain at or near the incision site. MAKE SURE YOU:  Understand these instructions. Will watch your condition. Will get help right away if you are  not doing well or get worse. Document Released: 11/18/2006 Document Revised: 09/30/2011 Document Reviewed: 01/19/2007 Exit[employer] Patient Information 2014 [employer], [place].   ________________________________________________________________________    WHAT IS A BLOOD TRANSFUSION? Blood Transfusion Information  A transfusion is the replacement of blood or some of its parts. Blood is made up of multiple cells which provide different functions. Red blood cells carry oxygen and are used for blood loss replacement. White blood cells fight against infection. Platelets control bleeding. Plasma helps clot blood. Other blood products are available for specialized needs, such as hemophilia or other clotting disorders. BEFORE THE TRANSFUSION  Who gives blood for transfusions?  Healthy volunteers who are fully evaluated to make sure their blood is safe. This is blood bank blood. Transfusion therapy is the safest it has ever been in the practice of medicine. Before blood is taken from a donor, a complete history is taken to make sure that person has no history of diseases nor engages in risky social behavior (examples are intravenous drug use or sexual activity with multiple partners). The donor's travel history is screened to minimize risk of transmitting infections, such as malaria. The donated blood is tested for signs of infectious diseases, such as HIV and hepatitis. The blood is then tested to be sure it is compatible with you in order to minimize the chance of a transfusion reaction. If you or a relative donates blood, this is often done in anticipation of surgery and is not appropriate for emergency situations. It takes many days to process the donated blood. RISKS AND COMPLICATIONS Although transfusion therapy is very safe and saves many lives, the main dangers of transfusion include:  Getting an infectious disease. Developing a transfusion reaction. This is an allergic reaction to something in the  blood you were given. Every precaution is taken to prevent this. The decision to have a blood transfusion has been considered carefully by your caregiver before blood is given. Blood is not given unless the benefits outweigh the risks. AFTER THE TRANSFUSION Right after receiving a blood transfusion, you will usually feel much better and more energetic. This is especially true if your red blood cells have gotten low (anemic). The transfusion raises the level of the red blood cells which carry oxygen, and this usually causes an energy increase. The nurse administering the transfusion will monitor you carefully for complications. HOME CARE INSTRUCTIONS  No special instructions are needed after a transfusion. You may find your energy is better. Speak with your caregiver about any limitations on activity for underlying diseases you may have. SEEK MEDICAL CARE IF:  Your condition is not improving after your transfusion. You develop redness or irritation at the intravenous (IV) site. SEEK IMMEDIATE MEDICAL CARE IF:  Any of the following symptoms occur over the next 12 hours: Shaking chills. You have a temperature by mouth above 102 F (38.9 C), not controlled by medicine. Chest, back, or muscle pain. People around you feel you are not acting correctly or are confused. Shortness of breath or difficulty breathing. Dizziness and fainting. You get a rash or develop hives. You have a decrease in urine output. Your urine turns a dark color or changes to pink, red, or brown. Any of the following symptoms occur over the next 10 days: You have a temperature by mouth above 102 F (38.9 C), not controlled by medicine. Shortness of breath. Weakness after normal activity. The white part of the eye turns yellow (jaundice). You have a decrease in the amount of urine or are urinating less often. Your urine turns a dark color or changes to pink, red, or brown. Document Released: 07/05/2000 Document Revised:  09/30/2011 Document Reviewed: 02/22/2008 [facility] Patient Information 2014 [place], [place].  _______________________________________________________________________

## 2021-04-26 ENCOUNTER — Encounter (HOSPITAL_COMMUNITY): Payer: Self-pay

## 2021-04-26 ENCOUNTER — Other Ambulatory Visit: Payer: Self-pay

## 2021-04-26 ENCOUNTER — Encounter (HOSPITAL_COMMUNITY)
Admission: RE | Admit: 2021-04-26 | Discharge: 2021-04-26 | Disposition: A | Discharge status: Home / Self Care | Payer: Medicare PPO | Source: Ambulatory Visit | Attending: Orthopaedic Surgery | Admitting: Orthopaedic Surgery

## 2021-04-26 DIAGNOSIS — Z01818 Encounter for other preprocedural examination: Secondary | ICD-10-CM | POA: Insufficient documentation

## 2021-04-26 DIAGNOSIS — E119 Type 2 diabetes mellitus without complications: Secondary | ICD-10-CM | POA: Insufficient documentation

## 2021-04-26 HISTORY — DX: Anemia, unspecified: D64.9

## 2021-04-26 LAB — BASIC METABOLIC PANEL
Anion gap: 11 (ref 5–15)
BUN: 17 mg/dL (ref 8–23)
CO2: 27 mmol/L (ref 22–32)
Calcium: 9.7 mg/dL (ref 8.9–10.3)
Chloride: 95 mmol/L — ABNORMAL LOW (ref 98–111)
Creatinine, Ser: 0.91 mg/dL (ref 0.44–1.00)
GFR, Estimated: >60 mL/min (ref >60)
Glucose, Bld: 88 mg/dL (ref 70–99)
Potassium: 3.7 mmol/L (ref 3.5–5.1)
Sodium: 133 mmol/L — ABNORMAL LOW (ref 135–145)

## 2021-04-26 LAB — SURGICAL PCR SCREEN
MRSA, PCR: NEGATIVE
Staphylococcus aureus: POSITIVE — AB

## 2021-04-26 LAB — CBC
HCT: 35.6 % — ABNORMAL LOW (ref 36.0–46.0)
Hemoglobin: 11.8 g/dL — ABNORMAL LOW (ref 12.0–15.0)
MCH: 33.7 pg (ref 26.0–34.0)
MCHC: 33.1 g/dL (ref 30.0–36.0)
MCV: 101.7 fL — ABNORMAL HIGH (ref 80.0–100.0)
Platelets: 282 10*3/uL (ref 150–400)
RBC: 3.5 MIL/uL — ABNORMAL LOW (ref 3.87–5.11)
RDW: 12.6 % (ref 11.5–15.5)
WBC: 6 10*3/uL (ref 4.0–10.5)
nRBC: 0 % (ref 0.0–0.2)

## 2021-04-27 ENCOUNTER — Other Ambulatory Visit: Payer: Self-pay | Admitting: Orthopaedic Surgery

## 2021-04-27 MED ORDER — GABAPENTIN 100 MG PO CAPS: 100.0000 mg | ORAL_CAPSULE | Freq: Three times a day (TID) | ORAL | 1 refills | Status: DC

## 2021-04-27 NOTE — Progress Notes (Signed)
PCR results sent to Dr. Blackman to review. 

## 2021-04-28 ENCOUNTER — Encounter: Payer: Self-pay | Admitting: Orthopaedic Surgery

## 2021-05-02 ENCOUNTER — Other Ambulatory Visit: Payer: Self-pay | Admitting: Orthopaedic Surgery

## 2021-05-03 ENCOUNTER — Telehealth: Payer: Self-pay | Admitting: *Deleted

## 2021-05-03 LAB — SARS CORONAVIRUS 2 (TAT 6-24 HRS): SARS Coronavirus 2: NEGATIVE

## 2021-05-03 NOTE — Care Plan (Signed)
[facility] RNCM call to patient to discuss her upcoming Right total knee replacement with Dr. Magnus Ivan on 05/04/21. She is an Ortho bundle through [facility] and is agreeable to case management. She will be getting assistance from her daughter after discharge and going to her daughter's home for assistance. She has a RW and a cane. Doesn't feel she needs a 3in1/BSC at this time. Anticipate HHPT will be needed after short hospital stay. Referral made to [facility] after choice provided. Wishes to have OPPT here at [facility] when appropriate. Reviewed post op care instructions. Will continue to follow for needs.

## 2021-05-03 NOTE — Telephone Encounter (Signed)
Ortho bundle pre-op call completed. 

## 2021-05-03 NOTE — H&P (Signed)
TOTAL KNEE ADMISSION H&P  Patient is being admitted for right total knee arthroplasty.  Subjective:  Chief Complaint:right knee pain.  HPI: Leslie [name], 70 y.o. female, has a history of pain and functional disability in the right knee due to arthritis and has failed non-surgical conservative treatments for greater than 12 weeks to includeNSAID's and/or analgesics, corticosteriod injections, viscosupplementation injections, flexibility and strengthening excercises, supervised PT with diminished ADL's post treatment, use of assistive devices, weight reduction as appropriate, and activity modification.  Onset of symptoms was gradual, starting 3 years ago with gradually worsening course since that time. The patient noted no past surgery on the right knee(s).  Patient currently rates pain in the right knee(s) at 10 out of 10 with activity. Patient has night pain, worsening of pain with activity and weight bearing, pain that interferes with activities of daily living, pain with passive range of motion, crepitus, and joint swelling.  Patient has evidence of subchondral sclerosis, periarticular osteophytes, and joint space narrowing by imaging studies. There is no active infection.  Patient Active Problem List   Diagnosis Date Noted  . Vitamin B12 deficiency 03/14/2021  . Macrocytosis without anemia 03/14/2021  . Vitamin D deficiency 02/17/2020  . Sleep disorder 07/10/2018  . ADHD (attention deficit hyperactivity disorder) 10/01/2017  . Unilateral primary osteoarthritis, right knee 08/20/2017  . Chronic pain of both shoulders 08/20/2017  . Complete tear of right rotator cuff 04/29/2017  . Osteoarthritis of left knee 06/16/2015  . Status post total left knee replacement 06/16/2015  . SUBSTANCE ABUSE 02/06/2009  . Attention deficit disorder 09/14/2007  . COLONIC POLYPS, HX OF 08/03/2007  . Essential hypertension 01/20/2007  . OBESITY NOS 12/04/2006  . Allergic rhinitis 12/04/2006  .  GASTROJEJUNOSTOMY, HX OF 12/04/2006  . BREAST BIOPSY, HX OF 12/04/2006   Past Medical History:  Diagnosis Date  . ADD (attention deficit disorder)   . Allergy   . Anemia   . Arthritis 07/22/2014   osteoarthritis left knee  . Colon polyps   . Hypertension   . Left knee pain    chronic  . Narcotic addiction (HCC)   . Obesity    post gastric bypass  . Vitamin D deficiency     Past Surgical History:  Procedure Laterality Date  . arthscopic knee surgery Left    several times  . BREAST BIOPSY    . BREAST SURGERY  years ago   breast biopsy  . CHOLECYSTECTOMY    . EYE SURGERY Bilateral 2013   Toric Lens implants  . EYE SURGERY Right 2014   corneal amniotic membrane  . GASTRIC BYPASS  2003  . KIDNEY DONATION  2004  . TOTAL KNEE ARTHROPLASTY Left 06/16/2015   Procedure: LEFT TOTAL KNEE ARTHROPLASTY;  Surgeon: Kathryne Hitch, MD;  Location: [facility];  Service: Orthopedics;  Laterality: Left;    No current facility-administered medications for this encounter.   Current Outpatient Medications  Medication Sig Dispense Refill Last Dose  . ALPRAZolam (XANAX) 0.25 MG tablet TAKE 1 TABLET(0.25 MG) BY MOUTH AT BEDTIME AS NEEDED FOR ANXIETY 30 tablet 2   . amphetamine-dextroamphetamine (ADDERALL XR) 30 MG 24 hr capsule Take 1 capsule (30 mg total) by mouth every morning. FILL IN ONE MONTH 30 capsule 0   . amphetamine-dextroamphetamine (ADDERALL) 20 MG tablet Take 1/2 to 1 tablet by mouth every evening if needed. 30 tablet 0   . FLUoxetine (PROZAC) 40 MG capsule TAKE 1 CAPSULE(40 MG) BY MOUTH EVERY MORNING 90 capsule 1   .  Glycerin-Hypromellose-PEG 400 (DRY EYE RELIEF DROPS OP) Place 1 drop into both eyes daily as needed (Dry eye).     . hydrochlorothiazide (HYDRODIURIL) 25 MG tablet TAKE 1 TABLET(25 MG) BY MOUTH DAILY 90 tablet 1   . lamoTRIgine (LAMICTAL) 100 MG tablet TAKE 1 TABLET(100 MG) BY MOUTH TWICE DAILY 180 tablet 1   . triamcinolone (NASACORT) 55 MCG/ACT AERO nasal inhaler  Place 2 sprays into the nose daily. (Patient taking differently: Place 2 sprays into the nose daily as needed (sinus).) 1 Inhaler 3   . Vitamin D, Ergocalciferol, (DRISDOL) 1.25 MG (50000 UNIT) CAPS capsule Take 1 capsule (50,000 Units total) by mouth every 7 (seven) days for 12 doses. 12 capsule 0   . zolpidem (AMBIEN) 10 MG tablet Take 1 tablet (10 mg total) by mouth at bedtime. 30 tablet 2   . gabapentin (NEURONTIN) 100 MG capsule Take 1 capsule (100 mg total) by mouth 3 (three) times daily. 60 capsule 1   . tiZANidine (ZANAFLEX) 2 MG tablet TAKE 1 TABLET(2 MG) BY MOUTH THREE TIMES DAILY (Patient not taking: No sig reported) 90 tablet 0 Not Taking   Allergies  Allergen Reactions  . Amoxicillin     Hives  . Sulfamethoxazole Nausea And Vomiting    See patient list of med intolerances due to h/o gastric bypass and nephrectomy    Social History   Tobacco Use  . Smoking status: Former    Types: Cigarettes    Quit date: 07/22/1992    Years since quitting: 28.8  . Smokeless tobacco: Never  Substance Use Topics  . Alcohol use: Yes    Comment: wine weekly    Family History  Problem Relation Age of Onset  . Heart disease Mother        post CABG history of CHF  . COPD Father   . Diabetes Sister   . Hypertension Sister        post renal transplant  . Heart disease Brother        CAD     Review of Systems  Musculoskeletal:  Positive for joint swelling.  All other systems reviewed and are negative.  Objective:  Physical Exam Vitals reviewed.  Constitutional:      Appearance: Normal appearance.  HENT:     Head: Normocephalic and atraumatic.  Eyes:     Extraocular Movements: Extraocular movements intact.     Pupils: Pupils are equal, round, and reactive to light.  Cardiovascular:     Rate and Rhythm: Normal rate and regular rhythm.  Pulmonary:     Effort: Pulmonary effort is normal.     Breath sounds: Normal breath sounds.  Abdominal:     Palpations: Abdomen is soft.   Musculoskeletal:     Cervical back: Normal range of motion and neck supple.     Right knee: Effusion, bony tenderness and crepitus present. Decreased range of motion. Tenderness present over the medial joint line, lateral joint line and patellar tendon. Abnormal alignment.  Neurological:     Mental Status: She is alert and oriented to person, place, and time.  Psychiatric:        Behavior: Behavior normal.    Vital signs in last 24 hours:    Labs:   Estimated body mass index is 34.98 kg/m as calculated from the following:   Height as of 04/26/21: 5\' 4"  (1.626 m).   Weight as of 04/26/21: 92.4 kg.   Imaging Review Plain radiographs demonstrate severe degenerative joint disease of the right  knee(s). The overall alignment ismild varus. The bone quality appears to be good for age and reported activity level.      Assessment/Plan:  End stage arthritis, right knee   The patient history, physical examination, clinical judgment of the provider and imaging studies are consistent with end stage degenerative joint disease of the right knee(s) and total knee arthroplasty is deemed medically necessary. The treatment options including medical management, injection therapy arthroscopy and arthroplasty were discussed at length. The risks and benefits of total knee arthroplasty were presented and reviewed. The risks due to aseptic loosening, infection, stiffness, patella tracking problems, thromboembolic complications and other imponderables were discussed. The patient acknowledged the explanation, agreed to proceed with the plan and consent was signed. Patient is being admitted for inpatient treatment for surgery, pain control, PT, OT, prophylactic antibiotics, VTE prophylaxis, progressive ambulation and ADL's and discharge planning. The patient is planning to be discharged home with home health services

## 2021-05-03 NOTE — Progress Notes (Addendum)
Called pt about time change for surgery. To arrive at 0830 for 1100 surgery on 05/04/21. Complete ERAS drink by 0800. Pt verbalizes understanding.

## 2021-05-04 ENCOUNTER — Observation Stay (HOSPITAL_COMMUNITY)
Admission: RE | Admit: 2021-05-04 | Discharge: 2021-05-07 | Disposition: A | Discharge status: Home / Self Care | Payer: Medicare PPO | Source: Ambulatory Visit | Attending: Orthopaedic Surgery | Admitting: Orthopaedic Surgery

## 2021-05-04 ENCOUNTER — Other Ambulatory Visit: Payer: Self-pay | Admitting: *Deleted

## 2021-05-04 ENCOUNTER — Encounter (HOSPITAL_COMMUNITY)
Admission: RE | Disposition: A | Discharge status: Home / Self Care | Payer: Self-pay | Source: Ambulatory Visit | Attending: Orthopaedic Surgery

## 2021-05-04 ENCOUNTER — Other Ambulatory Visit: Payer: Self-pay

## 2021-05-04 ENCOUNTER — Encounter (HOSPITAL_COMMUNITY): Payer: Self-pay | Admitting: Orthopaedic Surgery

## 2021-05-04 ENCOUNTER — Ambulatory Visit (HOSPITAL_COMMUNITY): Payer: Medicare PPO | Admitting: Anesthesiology

## 2021-05-04 ENCOUNTER — Observation Stay (HOSPITAL_COMMUNITY): Payer: Medicare PPO

## 2021-05-04 DIAGNOSIS — I10 Essential (primary) hypertension: Secondary | ICD-10-CM | POA: Diagnosis not present

## 2021-05-04 DIAGNOSIS — Z87891 Personal history of nicotine dependence: Secondary | ICD-10-CM | POA: Insufficient documentation

## 2021-05-04 DIAGNOSIS — G8918 Other acute postprocedural pain: Secondary | ICD-10-CM | POA: Diagnosis not present

## 2021-05-04 DIAGNOSIS — Z96652 Presence of left artificial knee joint: Secondary | ICD-10-CM | POA: Diagnosis not present

## 2021-05-04 DIAGNOSIS — M1711 Unilateral primary osteoarthritis, right knee: Principal | ICD-10-CM | POA: Insufficient documentation

## 2021-05-04 DIAGNOSIS — Z96651 Presence of right artificial knee joint: Secondary | ICD-10-CM

## 2021-05-04 DIAGNOSIS — Z79899 Other long term (current) drug therapy: Secondary | ICD-10-CM | POA: Insufficient documentation

## 2021-05-04 DIAGNOSIS — D649 Anemia, unspecified: Secondary | ICD-10-CM | POA: Diagnosis not present

## 2021-05-04 HISTORY — PX: TOTAL KNEE ARTHROPLASTY: SHX125

## 2021-05-04 LAB — TYPE AND SCREEN
ABO/RH(D): A POS
Antibody Screen: NEGATIVE

## 2021-05-04 SURGERY — ARTHROPLASTY, KNEE, TOTAL
Anesthesia: Spinal | Site: Knee | Laterality: Right

## 2021-05-04 MED ORDER — BUPIVACAINE-EPINEPHRINE (PF) 0.25% -1:200000 IJ SOLN
INTRAMUSCULAR | Status: AC
  Filled 2021-05-04: qty 30

## 2021-05-04 MED ORDER — ONDANSETRON HCL 4 MG PO TABS: 4.0000 mg | ORAL_TABLET | Freq: Four times a day (QID) | ORAL | Status: DC | PRN

## 2021-05-04 MED ORDER — SODIUM CHLORIDE 0.9 % IR SOLN
Status: DC | PRN
  Administered 2021-05-04: 1000 mL

## 2021-05-04 MED ORDER — METOCLOPRAMIDE HCL 5 MG/ML IJ SOLN: 5.0000 mg | Freq: Three times a day (TID) | INTRAMUSCULAR | Status: DC | PRN

## 2021-05-04 MED ORDER — INFLUENZA VAC A&B SA ADJ QUAD 0.5 ML IM PRSY
0.5000 mL | PREFILLED_SYRINGE | INTRAMUSCULAR | Status: DC
  Filled 2021-05-04: qty 0.5

## 2021-05-04 MED ORDER — MENTHOL 3 MG MT LOZG: 1.0000 | LOZENGE | OROMUCOSAL | Status: DC | PRN

## 2021-05-04 MED ORDER — POVIDONE-IODINE 10 % EX SWAB
2.0000 | Freq: Once | CUTANEOUS | Status: AC
Start: 2021-05-04 — End: 2021-05-04
  Administered 2021-05-04: 2 via TOPICAL

## 2021-05-04 MED ORDER — TRANEXAMIC ACID-NACL 1000-0.7 MG/100ML-% IV SOLN
1000.0000 mg | INTRAVENOUS | Status: AC
  Administered 2021-05-04: 1000 mg via INTRAVENOUS
  Filled 2021-05-04: qty 100

## 2021-05-04 MED ORDER — LACTATED RINGERS IV SOLN: INTRAVENOUS | Status: DC

## 2021-05-04 MED ORDER — ZOLPIDEM TARTRATE 5 MG PO TABS: 5.0000 mg | ORAL_TABLET | Freq: Every evening | ORAL | Status: DC | PRN

## 2021-05-04 MED ORDER — PROMETHAZINE HCL 25 MG/ML IJ SOLN: 6.2500 mg | INTRAMUSCULAR | Status: DC | PRN

## 2021-05-04 MED ORDER — HYDROMORPHONE HCL 1 MG/ML IJ SOLN
0.5000 mg | INTRAMUSCULAR | Status: DC | PRN
  Administered 2021-05-04 – 2021-05-05 (×2): 1 mg via INTRAVENOUS
  Filled 2021-05-04 (×2): qty 1

## 2021-05-04 MED ORDER — PHENYLEPHRINE 40 MCG/ML (10ML) SYRINGE FOR IV PUSH (FOR BLOOD PRESSURE SUPPORT)
PREFILLED_SYRINGE | INTRAVENOUS | Status: DC | PRN
  Administered 2021-05-04 (×2): 80 ug via INTRAVENOUS

## 2021-05-04 MED ORDER — METHOCARBAMOL 500 MG PO TABS
500.0000 mg | ORAL_TABLET | Freq: Four times a day (QID) | ORAL | Status: DC | PRN
  Administered 2021-05-04 – 2021-05-07 (×7): 500 mg via ORAL
  Filled 2021-05-04 (×7): qty 1

## 2021-05-04 MED ORDER — HYDROCHLOROTHIAZIDE 25 MG PO TABS
25.0000 mg | ORAL_TABLET | Freq: Every day | ORAL | Status: DC
  Administered 2021-05-05 – 2021-05-07 (×3): 25 mg via ORAL
  Filled 2021-05-04 (×3): qty 1

## 2021-05-04 MED ORDER — MIDAZOLAM HCL 2 MG/2ML IJ SOLN
1.0000 mg | INTRAMUSCULAR | Status: DC
  Administered 2021-05-04: 2 mg via INTRAVENOUS
  Filled 2021-05-04: qty 2

## 2021-05-04 MED ORDER — METOCLOPRAMIDE HCL 5 MG PO TABS: 5.0000 mg | ORAL_TABLET | Freq: Three times a day (TID) | ORAL | Status: DC | PRN

## 2021-05-04 MED ORDER — AMPHETAMINE-DEXTROAMPHET ER 10 MG PO CP24
30.0000 mg | ORAL_CAPSULE | ORAL | Status: DC
  Administered 2021-05-07: 30 mg via ORAL
  Filled 2021-05-04 (×5): qty 3

## 2021-05-04 MED ORDER — PHENYLEPHRINE HCL-NACL 20-0.9 MG/250ML-% IV SOLN
INTRAVENOUS | Status: DC | PRN
  Administered 2021-05-04: 25 ug/min via INTRAVENOUS

## 2021-05-04 MED ORDER — SODIUM CHLORIDE 0.9 % IV SOLN: INTRAVENOUS | Status: DC

## 2021-05-04 MED ORDER — PHENOL 1.4 % MT LIQD: 1.0000 | OROMUCOSAL | Status: DC | PRN

## 2021-05-04 MED ORDER — ASPIRIN 81 MG PO CHEW
81.0000 mg | CHEWABLE_TABLET | Freq: Two times a day (BID) | ORAL | Status: DC
  Administered 2021-05-04 – 2021-05-07 (×6): 81 mg via ORAL
  Filled 2021-05-04 (×6): qty 1

## 2021-05-04 MED ORDER — DIPHENHYDRAMINE HCL 12.5 MG/5ML PO ELIX: 12.5000 mg | ORAL_SOLUTION | ORAL | Status: DC | PRN

## 2021-05-04 MED ORDER — KETOROLAC TROMETHAMINE 15 MG/ML IJ SOLN
7.5000 mg | Freq: Four times a day (QID) | INTRAMUSCULAR | Status: AC
  Administered 2021-05-04 – 2021-05-05 (×4): 7.5 mg via INTRAVENOUS
  Filled 2021-05-04 (×4): qty 1

## 2021-05-04 MED ORDER — OXYCODONE HCL 5 MG PO TABS: 5.0000 mg | ORAL_TABLET | Freq: Once | ORAL | Status: DC | PRN

## 2021-05-04 MED ORDER — FENTANYL CITRATE PF 50 MCG/ML IJ SOSY
50.0000 ug | PREFILLED_SYRINGE | INTRAMUSCULAR | Status: DC
  Administered 2021-05-04: 100 ug via INTRAVENOUS
  Filled 2021-05-04: qty 2

## 2021-05-04 MED ORDER — PROPOFOL 10 MG/ML IV BOLUS
INTRAVENOUS | Status: AC
  Filled 2021-05-04: qty 20

## 2021-05-04 MED ORDER — PHENYLEPHRINE HCL (PRESSORS) 10 MG/ML IV SOLN
INTRAVENOUS | Status: AC
  Filled 2021-05-04: qty 2

## 2021-05-04 MED ORDER — OXYCODONE HCL 5 MG PO TABS
10.0000 mg | ORAL_TABLET | ORAL | Status: DC | PRN
  Administered 2021-05-04 – 2021-05-05 (×3): 15 mg via ORAL
  Filled 2021-05-04 (×3): qty 3

## 2021-05-04 MED ORDER — ACETAMINOPHEN 325 MG PO TABS
325.0000 mg | ORAL_TABLET | Freq: Four times a day (QID) | ORAL | Status: DC | PRN
Start: 2021-05-05 — End: 2021-05-07
  Administered 2021-05-05 – 2021-05-07 (×2): 650 mg via ORAL
  Filled 2021-05-04 (×2): qty 2

## 2021-05-04 MED ORDER — OXYCODONE HCL 5 MG PO TABS
5.0000 mg | ORAL_TABLET | ORAL | Status: DC | PRN
  Administered 2021-05-04: 10 mg via ORAL
  Filled 2021-05-04: qty 2

## 2021-05-04 MED ORDER — LAMOTRIGINE 100 MG PO TABS
100.0000 mg | ORAL_TABLET | Freq: Two times a day (BID) | ORAL | Status: DC
  Administered 2021-05-04 – 2021-05-07 (×6): 100 mg via ORAL
  Filled 2021-05-04 (×6): qty 1

## 2021-05-04 MED ORDER — DOCUSATE SODIUM 100 MG PO CAPS
100.0000 mg | ORAL_CAPSULE | Freq: Two times a day (BID) | ORAL | Status: DC
  Administered 2021-05-04 – 2021-05-07 (×6): 100 mg via ORAL
  Filled 2021-05-04 (×6): qty 1

## 2021-05-04 MED ORDER — ROPIVACAINE HCL 5 MG/ML IJ SOLN
INTRAMUSCULAR | Status: DC | PRN
  Administered 2021-05-04: 20 mL via PERINEURAL

## 2021-05-04 MED ORDER — BUPIVACAINE IN DEXTROSE 0.75-8.25 % IT SOLN
INTRATHECAL | Status: DC | PRN
  Administered 2021-05-04: 12 mg via INTRATHECAL

## 2021-05-04 MED ORDER — CLINDAMYCIN PHOSPHATE 600 MG/50ML IV SOLN
600.0000 mg | Freq: Four times a day (QID) | INTRAVENOUS | Status: AC
Start: 2021-05-04 — End: 2021-05-05
  Administered 2021-05-04 (×2): 600 mg via INTRAVENOUS
  Filled 2021-05-04 (×2): qty 50

## 2021-05-04 MED ORDER — MEPERIDINE HCL 50 MG/ML IJ SOLN: 6.2500 mg | INTRAMUSCULAR | Status: DC | PRN

## 2021-05-04 MED ORDER — OXYCODONE HCL 5 MG/5ML PO SOLN
5.0000 mg | Freq: Once | ORAL | Status: DC | PRN
Start: 2021-05-04 — End: 2021-05-04

## 2021-05-04 MED ORDER — 0.9 % SODIUM CHLORIDE (POUR BTL) OPTIME
TOPICAL | Status: DC | PRN
  Administered 2021-05-04: 300 mL

## 2021-05-04 MED ORDER — GABAPENTIN 100 MG PO CAPS
100.0000 mg | ORAL_CAPSULE | Freq: Three times a day (TID) | ORAL | Status: DC
  Administered 2021-05-04 – 2021-05-07 (×8): 100 mg via ORAL
  Filled 2021-05-04 (×8): qty 1

## 2021-05-04 MED ORDER — MIDAZOLAM HCL 2 MG/2ML IJ SOLN: 0.5000 mg | Freq: Once | INTRAMUSCULAR | Status: DC | PRN

## 2021-05-04 MED ORDER — BUPIVACAINE-EPINEPHRINE 0.25% -1:200000 IJ SOLN
INTRAMUSCULAR | Status: DC | PRN
  Administered 2021-05-04: 30 mL

## 2021-05-04 MED ORDER — CHLORHEXIDINE GLUCONATE 0.12 % MT SOLN
15.0000 mL | Freq: Once | OROMUCOSAL | Status: AC
  Administered 2021-05-04: 15 mL via OROMUCOSAL

## 2021-05-04 MED ORDER — PANTOPRAZOLE SODIUM 40 MG PO TBEC
40.0000 mg | DELAYED_RELEASE_TABLET | Freq: Every day | ORAL | Status: DC
  Administered 2021-05-04 – 2021-05-07 (×4): 40 mg via ORAL
  Filled 2021-05-04 (×4): qty 1

## 2021-05-04 MED ORDER — PROPOFOL 500 MG/50ML IV EMUL
INTRAVENOUS | Status: DC | PRN
  Administered 2021-05-04: 100 ug/kg/min via INTRAVENOUS

## 2021-05-04 MED ORDER — ORAL CARE MOUTH RINSE: 15.0000 mL | Freq: Once | OROMUCOSAL | Status: AC

## 2021-05-04 MED ORDER — HYDROMORPHONE HCL 1 MG/ML IJ SOLN: 0.2500 mg | INTRAMUSCULAR | Status: DC | PRN

## 2021-05-04 MED ORDER — LIDOCAINE 2% (20 MG/ML) 5 ML SYRINGE
INTRAMUSCULAR | Status: DC | PRN
  Administered 2021-05-04: 60 mg via INTRAVENOUS

## 2021-05-04 MED ORDER — ALUM & MAG HYDROXIDE-SIMETH 200-200-20 MG/5ML PO SUSP: 30.0000 mL | ORAL | Status: DC | PRN

## 2021-05-04 MED ORDER — METHOCARBAMOL 500 MG IVPB - SIMPLE MED
500.0000 mg | Freq: Four times a day (QID) | INTRAVENOUS | Status: DC | PRN
  Filled 2021-05-04: qty 50

## 2021-05-04 MED ORDER — ONDANSETRON HCL 4 MG/2ML IJ SOLN
4.0000 mg | Freq: Four times a day (QID) | INTRAMUSCULAR | Status: DC | PRN
  Filled 2021-05-04: qty 2

## 2021-05-04 MED ORDER — LIDOCAINE HCL (PF) 2 % IJ SOLN
INTRAMUSCULAR | Status: AC
  Filled 2021-05-04: qty 5

## 2021-05-04 MED ORDER — ALPRAZOLAM 0.25 MG PO TABS: 0.2500 mg | ORAL_TABLET | Freq: Every evening | ORAL | Status: DC | PRN

## 2021-05-04 MED ORDER — FLUOXETINE HCL 20 MG PO CAPS
40.0000 mg | ORAL_CAPSULE | Freq: Every day | ORAL | Status: DC
  Administered 2021-05-05 – 2021-05-07 (×3): 40 mg via ORAL
  Filled 2021-05-04 (×3): qty 2

## 2021-05-04 MED ORDER — OXYCODONE HCL ER 10 MG PO T12A
10.0000 mg | EXTENDED_RELEASE_TABLET | Freq: Two times a day (BID) | ORAL | Status: DC
  Administered 2021-05-04 – 2021-05-05 (×3): 10 mg via ORAL
  Filled 2021-05-04 (×3): qty 1

## 2021-05-04 MED ORDER — ACETAMINOPHEN 500 MG PO TABS
1000.0000 mg | ORAL_TABLET | Freq: Once | ORAL | Status: AC
  Administered 2021-05-04: 1000 mg via ORAL
  Filled 2021-05-04: qty 2

## 2021-05-04 MED ORDER — CLINDAMYCIN PHOSPHATE 900 MG/50ML IV SOLN
900.0000 mg | INTRAVENOUS | Status: AC
  Administered 2021-05-04: 900 mg via INTRAVENOUS
  Filled 2021-05-04: qty 50

## 2021-05-04 SURGICAL SUPPLY — 62 items
APL SKNCLS STERI-STRIP NONHPOA (GAUZE/BANDAGES/DRESSINGS)
BAG COUNTER SPONGE SURGICOUNT (BAG) ×1 IMPLANT
BAG SPEC THK2 15X12 ZIP CLS (MISCELLANEOUS) ×1
BAG SPNG CNTER NS LX DISP (BAG) ×1
BAG ZIPLOCK 12X15 (MISCELLANEOUS) ×2 IMPLANT
BENZOIN TINCTURE PRP APPL 2/3 (GAUZE/BANDAGES/DRESSINGS) IMPLANT
BLADE SAG 18X100X1.27 (BLADE) ×2 IMPLANT
BLADE SURG SZ10 CARB STEEL (BLADE) ×2 IMPLANT
BNDG ELASTIC 6X5.8 VLCR STR LF (GAUZE/BANDAGES/DRESSINGS) ×3 IMPLANT
BOWL SMART MIX CTS (DISPOSABLE) IMPLANT
BSPLAT TIB 5D E CMNT KN RT (Knees) ×1 IMPLANT
CEMENT BONE REFOBACIN R1X40 US (Cement) ×2 IMPLANT
COOLER ICEMAN CLASSIC (MISCELLANEOUS) ×2 IMPLANT
COVER SURGICAL LIGHT HANDLE (MISCELLANEOUS) ×2 IMPLANT
CUFF TOURN SGL QUICK 34 (TOURNIQUET CUFF) ×2
CUFF TRNQT CYL 34X4.125X (TOURNIQUET CUFF) ×1 IMPLANT
DECANTER SPIKE VIAL GLASS SM (MISCELLANEOUS) ×1 IMPLANT
DRAPE INCISE IOBAN 66X45 STRL (DRAPES) ×1 IMPLANT
DRAPE U-SHAPE 47X51 STRL (DRAPES) ×2 IMPLANT
DRSG PAD ABDOMINAL 8X10 ST (GAUZE/BANDAGES/DRESSINGS) ×3 IMPLANT
DURAPREP 26ML APPLICATOR (WOUND CARE) ×2 IMPLANT
ELECT BLADE TIP CTD 4 INCH (ELECTRODE) ×2 IMPLANT
ELECT REM PT RETURN 15FT ADLT (MISCELLANEOUS) ×2 IMPLANT
FEMUR CMT CR STD SZ 8 RT KNEE (Joint) ×2 IMPLANT
FEMUR CMTD CR STD SZ 8 RT KNEE (Joint) IMPLANT
GAUZE SPONGE 4X4 12PLY STRL (GAUZE/BANDAGES/DRESSINGS) ×2 IMPLANT
GAUZE XEROFORM 1X8 LF (GAUZE/BANDAGES/DRESSINGS) ×1 IMPLANT
GLOVE SRG 8 PF TXTR STRL LF DI (GLOVE) ×2 IMPLANT
GLOVE SURG ENC MOIS LTX SZ7.5 (GLOVE) ×2 IMPLANT
GLOVE SURG NEOPR MICRO LF SZ8 (GLOVE) ×2 IMPLANT
GLOVE SURG UNDER POLY LF SZ8 (GLOVE) ×4
GOWN STRL REUS W/TWL XL LVL3 (GOWN DISPOSABLE) ×4 IMPLANT
HANDPIECE INTERPULSE COAX TIP (DISPOSABLE) ×2
HDLS TROCR DRIL PIN KNEE 75 (PIN) ×2
HOLDER FOLEY CATH W/STRAP (MISCELLANEOUS) ×1 IMPLANT
IMMOBILIZER KNEE 20 (SOFTGOODS) ×2
IMMOBILIZER KNEE 20 THIGH 36 (SOFTGOODS) ×1 IMPLANT
INSERT TIB ARTISURF SZ8-11X12 (Insert) ×1 IMPLANT
KIT TURNOVER KIT A (KITS) ×2 IMPLANT
NS IRRIG 1000ML POUR BTL (IV SOLUTION) ×2 IMPLANT
PACK TOTAL KNEE CUSTOM (KITS) ×2 IMPLANT
PAD COLD SHLDR WRAP-ON (PAD) ×2 IMPLANT
PADDING CAST COTTON 6X4 STRL (CAST SUPPLIES) ×3 IMPLANT
PIN DRILL HDLS TROCAR 75 4PK (PIN) IMPLANT
PROTECTOR NERVE ULNAR (MISCELLANEOUS) ×2 IMPLANT
SCREW FEMALE HEX FIX 25X2.5 (ORTHOPEDIC DISPOSABLE SUPPLIES) ×1 IMPLANT
SET HNDPC FAN SPRY TIP SCT (DISPOSABLE) ×1 IMPLANT
SET PAD KNEE POSITIONER (MISCELLANEOUS) ×2 IMPLANT
STAPLER VISISTAT 35W (STAPLE) ×1 IMPLANT
STEM POLY PAT PLY 32M KNEE (Knees) ×1 IMPLANT
STEM TIBIA 5 DEG SZ E R KNEE (Knees) IMPLANT
STRIP CLOSURE SKIN 1/2X4 (GAUZE/BANDAGES/DRESSINGS) IMPLANT
SUT MNCRL AB 4-0 PS2 18 (SUTURE) IMPLANT
SUT VIC AB 0 CT1 27 (SUTURE) ×2
SUT VIC AB 0 CT1 27XBRD ANTBC (SUTURE) ×1 IMPLANT
SUT VIC AB 1 CT1 36 (SUTURE) ×4 IMPLANT
SUT VIC AB 2-0 CT1 27 (SUTURE) ×4
SUT VIC AB 2-0 CT1 TAPERPNT 27 (SUTURE) ×2 IMPLANT
TIBIA STEM 5 DEG SZ E R KNEE (Knees) ×2 IMPLANT
TRAY FOLEY MTR SLVR 14FR STAT (SET/KITS/TRAYS/PACK) ×1 IMPLANT
TRAY FOLEY MTR SLVR 16FR STAT (SET/KITS/TRAYS/PACK) IMPLANT
WATER STERILE IRR 1000ML POUR (IV SOLUTION) ×4 IMPLANT

## 2021-05-04 NOTE — Anesthesia Preprocedure Evaluation (Addendum)
Anesthesia Evaluation  Patient identified by MRN, date of birth, ID band Patient awake    Reviewed: Allergy & Precautions, NPO status , Patient's Chart, lab work & pertinent test results  History of Anesthesia Complications Negative for: history of anesthetic complications  Airway Mallampati: II  TM Distance: >3 FB Neck ROM: Full    Dental  (+) Partial Upper, Dental Advisory Given   Pulmonary former smoker,  05/02/2021 SARS coronavirus NEG   breath sounds clear to auscultation       Cardiovascular hypertension, Pt. on medications (-) angina Rhythm:Regular Rate:Normal     Neuro/Psych PSYCHIATRIC DISORDERS (ADD) Chronic pain    GI/Hepatic S/p gastric bypass   Endo/Other  obese  Renal/GU negative Renal ROS     Musculoskeletal  (+) Arthritis , Osteoarthritis,    Abdominal (+) + obese,   Peds  Hematology negative hematology ROS (+)   Anesthesia Other Findings   Reproductive/Obstetrics                            Anesthesia Physical Anesthesia Plan  ASA: 3  Anesthesia Plan: Spinal   Post-op Pain Management:  Regional for Post-op pain   Induction:   PONV Risk Score and Plan:   Airway Management Planned: Natural Airway and Simple Face Mask  Additional Equipment: None  Intra-op Plan:   Post-operative Plan:   Informed Consent: I have reviewed the patients History and Physical, chart, labs and discussed the procedure including the risks, benefits and alternatives for the proposed anesthesia with the patient or authorized representative who has indicated his/her understanding and acceptance.     Dental advisory given  Plan Discussed with: CRNA and Surgeon  Anesthesia Plan Comments: (Plan routine monitors, SAB with adductor canal block for post op analgesia)       Anesthesia Quick Evaluation

## 2021-05-04 NOTE — Evaluation (Signed)
Physical Therapy Evaluation Patient Details Name: Leslie [name] MRN: [id number] DOB: September 16, 1950 Today's Date: 05/04/2021  History of Present Illness  s/p R TKA. PMH: L TKA, ADHD, substance, gastric bypass  Clinical Impression  Pt is s/p TKA resulting in the deficits listed below (see PT Problem List).  Pt doing well today, amb 44' with RW and min assist, gait distance limited by PT d/t incr pain with mobility, RN aware   Pt will benefit from skilled PT to increase their independence and safety with mobility to allow discharge to the venue listed below.         Recommendations for follow up therapy are one component of a multi-disciplinary discharge planning process, led by the attending physician.  Recommendations may be updated based on patient status, additional functional criteria and insurance authorization.  Follow Up Recommendations Follow surgeon's recommendation for DC plan and follow-up therapies    Equipment Recommendations  None recommended by PT    Recommendations for Other Services       Precautions / Restrictions Precautions Precautions: Fall Required Braces or Orthoses: Knee Immobilizer - Right Knee Immobilizer - Right: Discontinue once straight leg raise with < 10 degree lag Restrictions Weight Bearing Restrictions: No Other Position/Activity Restrictions: WBAT      Mobility  Bed Mobility Overal bed mobility: Needs Assistance Bed Mobility: Supine to Sit     Supine to sit: Min assist     General bed mobility comments: assist with RLE    Transfers Overall transfer level: Needs assistance Equipment used: Rolling walker (2 wheeled) Transfers: Sit to/from Stand Sit to Stand: Min assist         General transfer comment: assist to rise and transition to RW, cues for hand placement  Ambulation/Gait Ambulation/Gait assistance: Min assist Gait Distance (Feet): 35 Feet Assistive device: Rolling walker (2 wheeled) Gait Pattern/deviations:  Step-to pattern     General Gait Details: cues for sequence and RW position, min assist to steady and maneuver RW  Stairs            Wheelchair Mobility    Modified Rankin (Stroke Patients Only)       Balance                                             Pertinent Vitals/Pain      Home Living Family/patient expects to be discharged to:: Private residence Living Arrangements: Alone Available Help at Discharge: Available 24 hours/day Type of Home: House Home Access: Stairs to enter Entrance Stairs-Rails: Doctor, general practice of Steps: 5 Home Layout: One level Home Equipment: Environmental consultant - 2 wheels;Shower seat;Cane - single point      Prior Function Level of Independence: Independent               Hand Dominance        Extremity/Trunk Assessment   Upper Extremity Assessment Upper Extremity Assessment: Overall WFL for tasks assessed    Lower Extremity Assessment Lower Extremity Assessment: RLE deficits/detail RLE Deficits / Details: ankle WFL,  knee and extension and hip flexion 2+/5       Communication   Communication: No difficulties  Cognition Arousal/Alertness: Awake/alert Behavior During Therapy: WFL for tasks assessed/performed Overall Cognitive Status: Within Functional Limits for tasks assessed  General Comments      Exercises Total Joint Exercises Ankle Circles/Pumps: AROM;Both;5 reps   Assessment/Plan    PT Assessment Patient needs continued PT services  PT Problem List Decreased strength;Decreased activity tolerance;Decreased mobility;Decreased range of motion;Decreased knowledge of use of DME;Pain       PT Treatment Interventions DME instruction;Therapeutic activities;Gait training;Stair training;Therapeutic exercise;Patient/family education;Functional mobility training    PT Goals (Current goals can be found in the Care Plan section)  Acute  Rehab PT Goals Patient Stated Goal: home soon, less knee pain PT Goal Formulation: With patient Time For Goal Achievement: 05/11/21 Potential to Achieve Goals: Good    Frequency 7X/week   Barriers to discharge        Co-evaluation               AM-PAC PT "6 Clicks" Mobility  Outcome Measure Help needed turning from your back to your side while in a flat bed without using bedrails?: A Little Help needed moving from lying on your back to sitting on the side of a flat bed without using bedrails?: A Little Help needed moving to and from a bed to a chair (including a wheelchair)?: A Little Help needed standing up from a chair using your arms (e.g., wheelchair or bedside chair)?: A Little Help needed to walk in hospital room?: A Little Help needed climbing 3-5 steps with a railing? : A Lot 6 Click Score: 17    End of Session Equipment Utilized During Treatment: Gait belt;Right knee immobilizer Activity Tolerance: Patient tolerated treatment well Patient left: with call bell/phone within reach;in chair;with chair alarm set;with family/visitor present   PT Visit Diagnosis: Other abnormalities of gait and mobility (R26.89);Difficulty in walking, not elsewhere classified (R26.2)    Time: 5638-7564 PT Time Calculation (min) (ACUTE ONLY): 17 min   Charges:   PT Evaluation $PT Eval Low Complexity: 1 Low          Carnell Beavers, PT  Acute Rehab Dept ([facility]) [phone] Pager [phone]  05/04/2021   [facility] 05/04/2021, 5:23 PM

## 2021-05-04 NOTE — Anesthesia Procedure Notes (Addendum)
Spinal  End time: 05/04/2021 11:43 AM Reason for block: surgical anesthesia Staffing Performed: anesthesiologist  Anesthesiologist: Jairo Ben, MD Preanesthetic Checklist Completed: patient identified, IV checked, site marked, risks and benefits discussed, surgical consent, monitors and equipment checked, pre-op evaluation and timeout performed Spinal Block Patient position: sitting Prep: DuraPrep and site prepped and draped Patient monitoring: blood pressure, continuous pulse ox, cardiac monitor and heart rate Approach: midline Location: L3-4 Injection technique: single-shot Needle Needle type: Introducer and Pencan  Needle gauge: 24 G Needle length: 9 cm Assessment Events: CSF return Additional Notes Pt identified in Operating room.  Monitors applied. Working IV access confirmed. Sterile prep, drape lumbar spine.  1% lido local L 3,4.  #24ga Pencan into clear CSF L 3,4.  12mg  0.75% Bupivacaine with dextrose injected with asp CSF beginning and end of injection.  Patient asymptomatic, VSS, no heme aspirated, tolerated well.  , MD

## 2021-05-04 NOTE — Progress Notes (Signed)
AssistedDr. Carswell Jackson with right, ultrasound guided, adductor canal block. Side rails up, monitors on throughout procedure. See vital signs in flow sheet. Tolerated Procedure well.  

## 2021-05-04 NOTE — Anesthesia Procedure Notes (Signed)
Procedure Name: MAC Date/Time: 05/04/2021 11:59 AM Performed by: Lieutenant Diego, CRNA Pre-anesthesia Checklist: Patient identified, Emergency Drugs available, Suction available, Patient being monitored and Timeout performed Patient Re-evaluated:Patient Re-evaluated prior to induction Oxygen Delivery Method: Simple face mask Preoxygenation: Pre-oxygenation with 100% oxygen Induction Type: IV induction

## 2021-05-04 NOTE — Interval H&P Note (Signed)
History and Physical Interval Note: The patient understands that she is here today for a right knee replacement to treat her right hip osteoarthritis.  There is been no acute or interval change in her medical status.  The risks and benefits of surgery been discussed in detail and informed consent is obtained.  The right knee has been marked.  05/04/2021 10:19 AM  Leslie [name]  has presented today for surgery, with the diagnosis of osteoarthritis right knee.  The various methods of treatment have been discussed with the patient and family. After consideration of risks, benefits and other options for treatment, the patient has consented to  Procedure(s): RIGHT TOTAL KNEE ARTHROPLASTY (Right) as a surgical intervention.  The patient's history has been reviewed, patient examined, no change in status, stable for surgery.  I have reviewed the patient's chart and labs.  Questions were answered to the patient's satisfaction.     Kathryne Hitch

## 2021-05-04 NOTE — Anesthesia Postprocedure Evaluation (Signed)
Anesthesia Post Note  Patient: Leslie [name]  Procedure(s) Performed: RIGHT TOTAL KNEE ARTHROPLASTY (Right: Knee)     Patient location during evaluation: PACU Anesthesia Type: Spinal Level of consciousness: awake and alert, patient cooperative and oriented Pain management: pain level controlled Vital Signs Assessment: post-procedure vital signs reviewed and stable Respiratory status: nonlabored ventilation, spontaneous breathing and respiratory function stable Cardiovascular status: blood pressure returned to baseline and stable Postop Assessment: no apparent nausea or vomiting, adequate PO intake, patient able to bend at knees and spinal receding Anesthetic complications: no   No notable events documented.  Last Vitals:  Vitals:   05/04/21 1500 05/04/21 1525  BP: 117/77 131/80  Pulse: 67 68  Resp: 18 16  Temp:  36.8 C  SpO2: 99% 100%    Last Pain:  Vitals:   05/04/21 1525  TempSrc: Oral  PainSc: 0-No pain                 Aubry Tucholski,E. Shogo Larkey

## 2021-05-04 NOTE — Brief Op Note (Signed)
05/04/2021  1:26 PM  PATIENT:  Leslie [name]  70 y.o. female  PRE-OPERATIVE DIAGNOSIS:  osteoarthritis right knee  POST-OPERATIVE DIAGNOSIS:  osteoarthritis right knee  PROCEDURE:  Procedure(s): RIGHT TOTAL KNEE ARTHROPLASTY (Right)  SURGEON:  Surgeon(s) and Role:    Kathryne Hitch, MD - Primary  PHYSICIAN ASSISTANT:  Rexene Edison, PA-C  ANESTHESIA:   local, regional, and spinal  EBL:  50 mL   COUNTS:  YES  TOURNIQUET:   Total Tourniquet Time Documented: Thigh (Right) - 62 minutes Total: Thigh (Right) - 62 minutes   DICTATION: .Other Dictation: Dictation Number [id number]  PLAN OF CARE: Admit for overnight observation  PATIENT DISPOSITION:  PACU - hemodynamically stable.   Delay start of Pharmacological VTE agent (>24hrs) due to surgical blood loss or risk of bleeding: no

## 2021-05-04 NOTE — Transfer of Care (Signed)
Immediate Anesthesia Transfer of Care Note  Patient: Leslie [name]  Procedure(s) Performed: RIGHT TOTAL KNEE ARTHROPLASTY (Right: Knee)  Patient Location: PACU  Anesthesia Type:MAC, Regional and Spinal  Level of Consciousness: awake and alert   Airway & Oxygen Therapy: Patient Spontanous Breathing and Patient connected to face mask oxygen  Post-op Assessment: Report given to RN and Post -op Vital signs reviewed and stable  Post vital signs: Reviewed and stable  Last Vitals:  Vitals Value Taken Time  BP 124/68 05/04/21 1347  Temp    Pulse 82 05/04/21 1348  Resp 16 05/04/21 1348  SpO2 100 % 05/04/21 1348  Vitals shown include unvalidated device data.  Last Pain:  Vitals:   05/04/21 0900  TempSrc:   PainSc: 8       Patients Stated Pain Goal: 5 (14/70/92 9574)  Complications: No notable events documented.

## 2021-05-04 NOTE — Anesthesia Procedure Notes (Signed)
Anesthesia Regional Block: Adductor canal block   Pre-Anesthetic Checklist: , timeout performed,  Correct Patient, Correct Site, Correct Laterality,  Correct Procedure, Correct Position, site marked,  Risks and benefits discussed,  Surgical consent,  Pre-op evaluation,  At surgeon's request and post-op pain management  Laterality: Right and Lower  Prep: chloraprep       Needles:  Injection technique: Single-shot  Needle Type: Echogenic Needle     Needle Length: 9cm  Needle Gauge: 21     Additional Needles:   Procedures:,,,, ultrasound used (permanent image in chart),,    Narrative:  Start time: 05/04/2021 11:13 AM End time: 05/04/2021 11:19 AM Injection made incrementally with aspirations every 5 mL.  Performed by: Personally  Anesthesiologist: Jairo Ben, MD  Additional Notes: Pt identified in Holding room.  Monitors applied. Working IV access confirmed. Sterile prep R thigh.  #21ga ECHOgenic Arrow block needle into adductor canal with US guidance.  20cc 0.5% Ropivacaine injected incrementally after negative test dose.  Patient asymptomatic, VSS, no heme aspirated, tolerated well.   Sandford Craze, MD

## 2021-05-04 NOTE — Plan of Care (Signed)
  Problem: Activity: Goal: Ability to avoid complications of mobility impairment will improve Outcome: Progressing Goal: Range of joint motion will improve Outcome: Progressing   Problem: Pain Management: Goal: Pain level will decrease with appropriate interventions Outcome: Progressing   

## 2021-05-05 DIAGNOSIS — I10 Essential (primary) hypertension: Secondary | ICD-10-CM | POA: Diagnosis not present

## 2021-05-05 DIAGNOSIS — Z96652 Presence of left artificial knee joint: Secondary | ICD-10-CM | POA: Diagnosis not present

## 2021-05-05 DIAGNOSIS — Z87891 Personal history of nicotine dependence: Secondary | ICD-10-CM | POA: Diagnosis not present

## 2021-05-05 DIAGNOSIS — M1711 Unilateral primary osteoarthritis, right knee: Secondary | ICD-10-CM | POA: Diagnosis not present

## 2021-05-05 DIAGNOSIS — Z79899 Other long term (current) drug therapy: Secondary | ICD-10-CM | POA: Diagnosis not present

## 2021-05-05 LAB — CBC
HCT: 28.3 % — ABNORMAL LOW (ref 36.0–46.0)
Hemoglobin: 9.2 g/dL — ABNORMAL LOW (ref 12.0–15.0)
MCH: 34.1 pg — ABNORMAL HIGH (ref 26.0–34.0)
MCHC: 32.5 g/dL (ref 30.0–36.0)
MCV: 104.8 fL — ABNORMAL HIGH (ref 80.0–100.0)
Platelets: 217 10*3/uL (ref 150–400)
RBC: 2.7 MIL/uL — ABNORMAL LOW (ref 3.87–5.11)
RDW: 12.6 % (ref 11.5–15.5)
WBC: 5.8 10*3/uL (ref 4.0–10.5)
nRBC: 0 % (ref 0.0–0.2)

## 2021-05-05 LAB — BASIC METABOLIC PANEL
Anion gap: 7 (ref 5–15)
BUN: 18 mg/dL (ref 8–23)
CO2: 27 mmol/L (ref 22–32)
Calcium: 8.3 mg/dL — ABNORMAL LOW (ref 8.9–10.3)
Chloride: 97 mmol/L — ABNORMAL LOW (ref 98–111)
Creatinine, Ser: 1.05 mg/dL — ABNORMAL HIGH (ref 0.44–1.00)
GFR, Estimated: 57 mL/min — ABNORMAL LOW (ref >60)
Glucose, Bld: 123 mg/dL — ABNORMAL HIGH (ref 70–99)
Potassium: 3.3 mmol/L — ABNORMAL LOW (ref 3.5–5.1)
Sodium: 131 mmol/L — ABNORMAL LOW (ref 135–145)

## 2021-05-05 MED ORDER — METHOCARBAMOL 500 MG PO TABS: 500.0000 mg | ORAL_TABLET | Freq: Four times a day (QID) | ORAL | 1 refills | Status: DC | PRN

## 2021-05-05 MED ORDER — HYDROMORPHONE HCL 4 MG PO TABS: 4.0000 mg | ORAL_TABLET | ORAL | 0 refills | Status: DC | PRN

## 2021-05-05 MED ORDER — HYDROMORPHONE HCL 2 MG PO TABS
4.0000 mg | ORAL_TABLET | ORAL | Status: DC | PRN
  Administered 2021-05-05 – 2021-05-07 (×11): 4 mg via ORAL
  Filled 2021-05-05 (×11): qty 2

## 2021-05-05 MED ORDER — ASPIRIN 81 MG PO CHEW: 81.0000 mg | CHEWABLE_TABLET | Freq: Two times a day (BID) | ORAL | 0 refills | Status: DC

## 2021-05-05 NOTE — Discharge Instructions (Signed)

## 2021-05-05 NOTE — Op Note (Signed)
Leslie [name], Leslie [name] MEDICAL RECORD NO: [id number] ACCOUNT NO: [id number] DATE OF BIRTH: 1951/05/26 FACILITY: [facility] LOCATION: [id number] PHYSICIAN: Vanita Panda. Magnus Ivan, MD  Operative Report   DATE OF PROCEDURE: 05/04/2021   PREOPERATIVE DIAGNOSES:  Severe end-stage arthritis and degenerative joint disease, right knee.  POSTOPERATIVE DIAGNOSES:  Severe end-stage arthritis and degenerative joint disease, right knee.  PROCEDURE:  Right total knee arthroplasty.  IMPLANTS:  Biomet/Zimmer Persona knee system, cemented knee system with size 8 right CR femur, size E right tibia tray, 12 mm medial congruent fixed bearing polyethylene insert, size 32 patellar button.  SURGEON:  Doneen Poisson, M.D.  ASSISTANT:  Richardean Canal, PA-C.  ANESTHESIA:   1.  Right lower extremity adductor canal block. 2.  Spinal. 3.  Local with 0.25% Marcaine with epinephrine.  ANTIBIOTICS: 900 mg IV clindamycin.  TOURNIQUET TIME:  61 minutes.  ESTIMATED BLOOD LOSS:  100 mL  COMPLICATIONS:  None.  INDICATIONS:  The patient is a very pleasant and active 70 year old female well known to me.  She has debilitating arthritis involving her right knee that is bone-on-bone and end-stage arthritis.  We actually replaced her left knee several years ago.   She has been reluctant to have this knee replaced, but the pain has been quite terrible to the point that it is detrimentally affecting her mobility, her quality of life and her activities of daily living.  She finally does wish to have this replaced.   She said her left knee replaced and has done very well.  Having had this done before, she is fully aware of the risk of acute blood loss anemia, nerve or vessel injury, fracture, infection, DVT, implant failure and skin and soft tissue issues.  She  understands our goals are to decrease pain, improve mobility and overall improve quality of life.  DESCRIPTION OF PROCEDURE:  After informed consent was obtained,  appropriate right knee was marked.  Anesthesia obtained and adductor canal block of the right lower extremity in the holding room.  She was then brought to the operating room and sat up on  the operating table.  Spinal anesthesia was obtained.  She was laid in supine position on the operating table.  Foley catheter was placed and a nonsterile tourniquet was placed around the upper right thigh.  Her right thigh, knee, leg, ankle and foot  were prepped and draped with DuraPrep and sterile drapes including a sterile stockinette.  A timeout was called.  She was identified correct patient, correct right knee.  We then used Esmarch to wrap that leg and tourniquet was inflated to 300 mm of  pressure.  We then made a direct midline incision over the patella and carried this proximally and distally.  We dissected the knee joint, carried out a medial parapatellar arthrotomy, finding very large joint effusion and significant denuding of  cartilage in all 3 compartments of her knee.  The notch was not even visible overgrown with bone.  We removed osteophytes in all three compartments as well as the remnants of any ACL that we could get to, as well as the medial and lateral meniscus.  With the  knee in a flexed position, we set our extramedullary cutting guide for taking 2 mm off the low side and 9 mm off the high side, correcting for varus and valgus and a -3 degrees slope.  We made this cut without difficulty and based on that cut we  actually brought it down 2 more millimeters.  We then used the  intramedullary guide for femur to the notch of the knee setting this for a right knee at 5 degrees externally rotated for a 10 mm distal femoral cut.  We made that cut without difficulty, and  brought the knee back down to full extension with a 10 mm extension block, she actually slightly just hyperextended.  We then went back to the femur and put our femoral sizing guide based off the epicondylar axis and Whiteside line.   Based off of this,  we chose a size 8 right femur.  We put a 4-in-1 cutting block for a size 8 right femur, made our anterior and posterior cuts, followed by our chamfer cuts.  We did not need to make a box cut since this was a CR femur.  We then went back to the tibia and  chose a size E right tibial tray for coverage of the tibial plateau.  We made our keel punch and drill hole off of this.  With a size E right tibial trial we trialled an 8 right femur and then an 11 mm and final 12 mm medial congruent polyethylene  insert.  We were pleased with the range of motion and stability with a 12 mm insert.  We then made our patellar cut and drilled three holes for a size 32 patellar button.  With all trials were placed in the knee, we put it through several cycles of  motion.  We were pleased with range of motion and stability.  We then removed all instrumentation from the knee and irrigated the knee with normal saline solution, then drying it real well.  With the knee in a flexed position, we mixed our cement and  cemented our real Biomet Zimmer size E right tibial tray followed by cementing our size 8 right femur.  We removed excess cement debris from the knee and then placed our 12 mm fixed bearing CR medial congruent polyethylene insert.  We then cemented our  patellar button, size 32.  With the knee held compressed and fully extended, we allowed the cement to harden.  Once it hardened, we let the tourniquet down.  Hemostasis was obtained with electrocautery.  We removed any excess cement debris from the knee  and then closed the arthrotomy with interrupted #1 Vicryl suture followed by 0 Vicryl to close the deep tissue and 2-0 Vicryl for subcutaneous tissue.  The skin was closed with staples.  Xeroform, well-padded sterile dressing was applied.  She was taken  to recovery room in stable condition with all final counts being correct and no complications noted.  Of note, Rexene Edison, PA-C did assist during the  entire case and his assistance was crucial for facilitating every aspect of this case.      SUJ D: 05/04/2021 1:24:50 pm T: 05/05/2021 3:33:00 am  JOB: [id number]/ [id number]

## 2021-05-05 NOTE — Progress Notes (Signed)
Subjective: 1 Day Post-Op Procedure(s) (LRB): RIGHT TOTAL KNEE ARTHROPLASTY (Right) Patient reports pain as severe.  Significant limitations in mobility also due to sciatica.  Objective: Vital signs in last 24 hours: Temp:  [97.8 F (36.6 C)-98.3 F (36.8 C)] 98.1 F (36.7 C) (10/15 0955) Pulse Rate:  [67-84] 73 (10/15 0955) Resp:  [9-23] 16 (10/15 0955) BP: (102-153)/(55-82) 123/74 (10/15 0955) SpO2:  [92 %-100 %] 96 % (10/15 0955)  Intake/Output from previous day: 10/14 0701 - 10/15 0700 In: 2293.1 [P.O.:600; I.V.:1543.1; IV Piggyback:150] Out: 1175 [Urine:1125; Blood:50] Intake/Output this shift: No intake/output data recorded.  Recent Labs    05/05/21 0316  HGB 9.2*   Recent Labs    05/05/21 0316  WBC 5.8  RBC 2.70*  HCT 28.3*  PLT 217   Recent Labs    05/05/21 0316  NA 131*  K 3.3*  CL 97*  CO2 27  BUN 18  CREATININE 1.05*  GLUCOSE 123*  CALCIUM 8.3*   No results for input(s): LABPT, INR in the last 72 hours.  Sensation intact distally Intact pulses distally Dorsiflexion/Plantar flexion intact Incision: dressing C/D/I Compartment soft   Assessment/Plan: 1 Day Post-Op Procedure(s) (LRB): RIGHT TOTAL KNEE ARTHROPLASTY (Right) Up with therapy Discharge to home when pain controlled and improved mobility     Kathryne Hitch 05/05/2021, 11:01 AM

## 2021-05-05 NOTE — Plan of Care (Signed)
  Problem: Pain Management: Goal: Pain level will decrease with appropriate interventions Outcome: Progressing   

## 2021-05-05 NOTE — Progress Notes (Signed)
Physical Therapy Treatment Patient Details Name: Leslie [name] MRN: [id number] DOB: 09/09/1950 Today's Date: 05/05/2021   History of Present Illness s/p R TKA. PMH: L TKA, ADHD, substance, gastric bypass    PT Comments    Pt progressing slowly d/t lethargy. Seemed more alert this pm initially, however mobility attempts limited d/t pt falling asleep repeatedly, chair brought to pt and required 2 person assist to guide pt to chair and prevent fall.   Recommendations for follow up therapy are one component of a multi-disciplinary discharge planning process, led by the attending physician.  Recommendations may be updated based on patient status, additional functional criteria and insurance authorization.  Follow Up Recommendations  Follow surgeon's recommendation for DC plan and follow-up therapies     Equipment Recommendations  None recommended by PT    Recommendations for Other Services       Precautions / Restrictions Precautions Precautions: Fall;Knee Required Braces or Orthoses: Knee Immobilizer - Right Knee Immobilizer - Right: Discontinue once straight leg raise with < 10 degree lag Restrictions Other Position/Activity Restrictions: WBAT     Mobility  Bed Mobility   Bed Mobility: Supine to Sit     Supine to sit: Mod assist     General bed mobility comments: assist with RLE, trunk. incr time and cues for sequence; multiple attempts to complete task, repeatedly falling asleep with posterior LOB    Transfers Overall transfer level: Needs assistance Equipment used: Rolling walker (2 wheeled) Transfers: Sit to/from [employer] Sit to Stand: Min assist;Mod assist;+2 physical assistance;+2 safety/equipment Stand pivot transfers: Mod assist;+2 physical assistance;+2 safety/equipment       General transfer comment: 2 standing trials. assist to rise and transition to RW, multi-modal cues for hand placement and RLE position;  pt took a few steps  then chair brought to pt d/t incr fatigue, increasing hip and trunk flexion. pt assisted back to bed with 2 person assist stand pivot. cues to self assist and come to full stand for pivot back to bed  Ambulation/Gait             General Gait Details: pt able to take a few steps however pt began to flex hips/trunk excessively, unable to maintain upright and chair moved to pt/pt guided to chair  to prevent fall   Stairs             Wheelchair Mobility    Modified Rankin (Stroke Patients Only)       Balance                                            Cognition Arousal/Alertness: Lethargic;Suspect due to medications Behavior During Therapy: [facility] for tasks assessed/performed Overall Cognitive Status: Within Functional Limits for tasks assessed                                 General Comments: arouses however continues to fall asleep repeatedly. difficulty staying awake to follow commands, needing constant stimuli. pt states she is "sleepy from the pain" and that she "is not falling asleep"      Exercises Total Joint Exercises Quad Sets: AROM;Both;Limitations Quad Sets Limitations: lethargy--unable to stay awake for exercises    General Comments        Pertinent Vitals/Pain Pain Assessment: 0-10 Pain Score: 5  Pain Location:  right knee Pain Descriptors / Indicators: Aching;Discomfort;Grimacing;Guarding Pain Intervention(s): Limited activity within patient's tolerance;Monitored during session;Premedicated before session;Repositioned;Ice applied    Home Living                      Prior Function            PT Goals (current goals can now be found in the care plan section) Acute Rehab PT Goals Patient Stated Goal: home soon, less knee pain PT Goal Formulation: With patient Time For Goal Achievement: 05/11/21 Potential to Achieve Goals: Good Progress towards PT goals: Not progressing toward goals - comment (lethargy)     Frequency    7X/week      PT Plan Current plan remains appropriate    Co-evaluation              AM-PAC PT "6 Clicks" Mobility   Outcome Measure  Help needed turning from your back to your side while in a flat bed without using bedrails?: A Little Help needed moving from lying on your back to sitting on the side of a flat bed without using bedrails?: A Lot Help needed moving to and from a bed to a chair (including a wheelchair)?: A Lot Help needed standing up from a chair using your arms (e.g., wheelchair or bedside chair)?: A Lot Help needed to walk in hospital room?: Total Help needed climbing 3-5 steps with a railing? : Total 6 Click Score: 11    End of Session Equipment Utilized During Treatment: Gait belt;Right knee immobilizer Activity Tolerance: Patient limited by lethargy Patient left: in bed;with call bell/phone within reach;with bed alarm set   PT Visit Diagnosis: Other abnormalities of gait and mobility (R26.89);Difficulty in walking, not elsewhere classified (R26.2)     Time: 1359-1420 PT Time Calculation (min) (ACUTE ONLY): 21 min  Charges:  $Gait Training: 8-22 mins                     Delice Bison, PT  Acute Rehab Dept ([facility]) [phone] Pager [phone]  05/05/2021    [facility] 05/05/2021, 3:22 PM

## 2021-05-05 NOTE — Progress Notes (Signed)
Physical Therapy Treatment Patient Details Name: Leslie [name] MRN: [id number] DOB: 11-29-1950 Today's Date: 05/05/2021   History of Present Illness s/p R TKA. PMH: L TKA, ADHD, substance, gastric bypass    PT Comments    Pt progressing slowly d/t lethargy this am. Having a very difficult time staying awake, arouses easily to voice;  unable to void. Continue PT POC  Recommendations for follow up therapy are one component of a multi-disciplinary discharge planning process, led by the attending physician.  Recommendations may be updated based on patient status, additional functional criteria and insurance authorization.  Follow Up Recommendations  Follow surgeon's recommendation for DC plan and follow-up therapies     Equipment Recommendations  None recommended by PT    Recommendations for Other Services       Precautions / Restrictions Precautions Precautions: Fall;Knee Required Braces or Orthoses: Knee Immobilizer - Right Knee Immobilizer - Right: Discontinue once straight leg raise with < 10 degree lag Restrictions Weight Bearing Restrictions: No Other Position/Activity Restrictions: WBAT     Mobility  Bed Mobility Overal bed mobility: Needs Assistance Bed Mobility: Supine to Sit     Supine to sit: Mod assist;Max assist     General bed mobility comments: assist with RLE, trunk. incr time and cues for sequence    Transfers Overall transfer level: Needs assistance Equipment used: Rolling walker (2 wheeled) Transfers: Sit to/from [employer] Sit to Stand: Mod assist;+2 physical assistance;+2 safety/equipment Stand pivot transfers: Mod assist;+2 physical assistance;+2 safety/equipment       General transfer comment: assist to rise and transition to RW, cues for hand placement; pt scissoring LEs, trunk flexed. improved effort on second standing trial however much difficulty maintaining upright. multi-modal cues for posture, sequence and LE  position  Ambulation/Gait             General Gait Details:  (unable d/t lethargy)   Stairs             Wheelchair Mobility    Modified Rankin (Stroke Patients Only)       Balance                                            Cognition Arousal/Alertness: Lethargic;Suspect due to medications Behavior During Therapy: [facility] for tasks assessed/performed Overall Cognitive Status: Within Functional Limits for tasks assessed                                 General Comments: arouses however continues to fall asleep repeatedly. difficulty staying awake to follow commands, needing constant stimuli      Exercises Total Joint Exercises Ankle Circles/Pumps: AROM;Both;5 reps    General Comments        Pertinent Vitals/Pain Pain Assessment: 0-10 Pain Score: 5  Pain Location: right knee Pain Descriptors / Indicators: Aching;Burning;Discomfort;Grimacing;Guarding Pain Intervention(s): Limited activity within patient's tolerance;Monitored during session;Premedicated before session;Repositioned    Home Living                      Prior Function            PT Goals (current goals can now be found in the care plan section) Acute Rehab PT Goals Patient Stated Goal: home soon, less knee pain PT Goal Formulation: With patient Time For Goal Achievement: 05/11/21 Potential  to Achieve Goals: Good Progress towards PT goals: Not progressing toward goals - comment (lethargy)    Frequency    7X/week      PT Plan Current plan remains appropriate    Co-evaluation              AM-PAC PT "6 Clicks" Mobility   Outcome Measure  Help needed turning from your back to your side while in a flat bed without using bedrails?: A Little Help needed moving from lying on your back to sitting on the side of a flat bed without using bedrails?: A Little Help needed moving to and from a bed to a chair (including a wheelchair)?: A Little Help  needed standing up from a chair using your arms (e.g., wheelchair or bedside chair)?: A Lot Help needed to walk in hospital room?: A Lot Help needed climbing 3-5 steps with a railing? : Total 6 Click Score: 14    End of Session Equipment Utilized During Treatment: Gait belt;Right knee immobilizer Activity Tolerance: Patient tolerated treatment well Patient left: with call bell/phone within reach;in chair;with chair alarm set;with family/visitor present   PT Visit Diagnosis: Other abnormalities of gait and mobility (R26.89);Difficulty in walking, not elsewhere classified (R26.2)     Time: 9233-0076 PT Time Calculation (min) (ACUTE ONLY): 26 min  Charges:  $Therapeutic Activity: 23-37 mins                     Delice Bison, PT  Acute Rehab Dept [facility]) [phone] Pager [phone]  05/05/2021    [facility] 05/05/2021, 9:58 AM

## 2021-05-06 DIAGNOSIS — M1711 Unilateral primary osteoarthritis, right knee: Secondary | ICD-10-CM | POA: Diagnosis not present

## 2021-05-06 DIAGNOSIS — Z96652 Presence of left artificial knee joint: Secondary | ICD-10-CM | POA: Diagnosis not present

## 2021-05-06 DIAGNOSIS — I10 Essential (primary) hypertension: Secondary | ICD-10-CM | POA: Diagnosis not present

## 2021-05-06 DIAGNOSIS — Z87891 Personal history of nicotine dependence: Secondary | ICD-10-CM | POA: Diagnosis not present

## 2021-05-06 DIAGNOSIS — Z79899 Other long term (current) drug therapy: Secondary | ICD-10-CM | POA: Diagnosis not present

## 2021-05-06 NOTE — Progress Notes (Signed)
Physical Therapy Treatment Patient Details Name: Leslie [name] MRN: [id number] DOB: 06-06-1951 Today's Date: 05/06/2021   History of Present Illness s/p R TKA. PMH: L TKA, ADHD, substance, gastric bypass    PT Comments    Pt progressing. Fatigued and requesting to stay in bed however agreeable to HEP. Tolerated TKA exercises well. Feel pt will need another day to work with PT/meet goals and should be ready to d/c tomorrow with family assist.   Recommendations for follow up therapy are one component of a multi-disciplinary discharge planning process, led by the attending physician.  Recommendations may be updated based on patient status, additional functional criteria and insurance authorization.  Follow Up Recommendations  Follow surgeon's recommendation for DC plan and follow-up therapies     Equipment Recommendations  None recommended by PT    Recommendations for Other Services       Precautions / Restrictions Precautions Precautions: Fall;Knee Required Braces or Orthoses: Knee Immobilizer - Right Knee Immobilizer - Right: Discontinue once straight leg raise with < 10 degree lag Restrictions Weight Bearing Restrictions: No Other Position/Activity Restrictions: WBAT     Mobility  Bed Mobility Overal bed mobility: Needs Assistance Bed Mobility: Supine to Sit     Supine to sit: Min assist;Mod assist     General bed mobility comments: pt food arrived, declines OOB/amb again    Transfers Overall transfer level: Needs assistance Equipment used: Rolling walker (2 wheeled) Transfers: Sit to/from Stand Sit to Stand: Min assist;Mod assist Stand pivot transfers: Mod assist;+2 physical assistance;+2 safety/equipment       General transfer comment: incr time, cues for hadn placement and topwer up to standing  Ambulation/Gait Ambulation/Gait assistance: Min assist;+2 safety/equipment Gait Distance (Feet): 40 Feet Assistive device: Rolling walker (2 wheeled) Gait  Pattern/deviations: Step-to pattern;Decreased stance time - right;Shuffle     General Gait Details: cues for sequence, RW position and step length   Stairs             Wheelchair Mobility    Modified Rankin (Stroke Patients Only)       Balance                                            Cognition Arousal/Alertness: Awake/alert Behavior During Therapy: WFL for tasks assessed/performed Overall Cognitive Status: Within Functional Limits for tasks assessed                                 General Comments: much more alert today      Exercises Total Joint Exercises Ankle Circles/Pumps: AROM;Both;10 reps Quad Sets: AROM;Both;10 reps Heel Slides: AAROM;Right;10 reps Hip ABduction/ADduction: AROM;AAROM;Right;10 reps Straight Leg Raises: AAROM;Right;Strengthening;10 reps    General Comments        Pertinent Vitals/Pain Pain Location: right knee Pain Descriptors / Indicators: Aching;Discomfort;Grimacing;Guarding    Home Living                      Prior Function            PT Goals (current goals can now be found in the care plan section) Acute Rehab PT Goals Patient Stated Goal: home soon, less knee pain PT Goal Formulation: With patient Time For Goal Achievement: 05/11/21 Potential to Achieve Goals: Good Progress towards PT goals: Progressing toward goals  Frequency    7X/week      PT Plan Current plan remains appropriate    Co-evaluation              AM-PAC PT "6 Clicks" Mobility   Outcome Measure  Help needed turning from your back to your side while in a flat bed without using bedrails?: A Little Help needed moving from lying on your back to sitting on the side of a flat bed without using bedrails?: A Lot Help needed moving to and from a bed to a chair (including a wheelchair)?: A Lot Help needed standing up from a chair using your arms (e.g., wheelchair or bedside chair)?: A Lot Help needed  to walk in hospital room?: A Little Help needed climbing 3-5 steps with a railing? : A Lot 6 Click Score: 14    End of Session Equipment Utilized During Treatment: Gait belt Activity Tolerance: Patient tolerated treatment well Patient left: with call bell/phone within reach;in chair;with chair alarm set;with family/visitor present   PT Visit Diagnosis: Other abnormalities of gait and mobility (R26.89);Difficulty in walking, not elsewhere classified (R26.2)     Time: 3159-4585 PT Time Calculation (min) (ACUTE ONLY): 28 min  Charges:  $Gait Training: 8-22 mins $Therapeutic Exercise: 23-37 mins $Therapeutic Activity: 8-22 mins                     Delice Bison, PT  Acute Rehab Dept ([facility]) [phone] Pager [phone]  05/06/2021    [facility] 05/06/2021, 2:24 PM

## 2021-05-06 NOTE — Progress Notes (Signed)
Physical Therapy Treatment Patient Details Name: Leslie [name] MRN: [id number] DOB: November 01, 1950 Today's Date: 05/06/2021   History of Present Illness s/p R TKA. PMH: L TKA, ADHD, substance, gastric bypass    PT Comments    Pt progressing toward goals. Incr gait distance and tolerance to activity today. More alert with better pain control. Will continue PT POC in acute setting.  Will hopefully be ready to d/c tomorrow with family assist.   Recommendations for follow up therapy are one component of a multi-disciplinary discharge planning process, led by the attending physician.  Recommendations may be updated based on patient status, additional functional criteria and insurance authorization.  Follow Up Recommendations  Follow surgeon's recommendation for DC plan and follow-up therapies     Equipment Recommendations  None recommended by PT    Recommendations for Other Services       Precautions / Restrictions Precautions Precautions: Fall;Knee Required Braces or Orthoses: Knee Immobilizer - Right Knee Immobilizer - Right: Discontinue once straight leg raise with < 10 degree lag Restrictions Weight Bearing Restrictions: No Other Position/Activity Restrictions: WBAT     Mobility  Bed Mobility Overal bed mobility: Needs Assistance Bed Mobility: Supine to Sit     Supine to sit: Min assist;Mod assist     General bed mobility comments: assist with RLE, trunk. incr time and cues for sequence; assist with RLE and trunk to upright    Transfers Overall transfer level: Needs assistance Equipment used: Rolling walker (2 wheeled) Transfers: Sit to/from Stand Sit to Stand: Min assist;Mod assist Stand pivot transfers: Mod assist;+2 physical assistance;+2 safety/equipment       General transfer comment: incr time, cues for hadn placement and topwer up to standing  Ambulation/Gait Ambulation/Gait assistance: Min assist;+2 safety/equipment Gait Distance (Feet): 40  Feet Assistive device: Rolling walker (2 wheeled) Gait Pattern/deviations: Step-to pattern;Decreased stance time - right;Shuffle     General Gait Details: cues for sequence, RW position and step length   Stairs             Wheelchair Mobility    Modified Rankin (Stroke Patients Only)       Balance                                            Cognition Arousal/Alertness: Awake/alert Behavior During Therapy: WFL for tasks assessed/performed Overall Cognitive Status: Within Functional Limits for tasks assessed                                 General Comments: much more alert today      Exercises Total Joint Exercises Quad Sets: AROM;Both;Limitations    General Comments        Pertinent Vitals/Pain Pain Location: right knee Pain Descriptors / Indicators: Aching;Discomfort;Grimacing;Guarding    Home Living                      Prior Function            PT Goals (current goals can now be found in the care plan section) Acute Rehab PT Goals Patient Stated Goal: home soon, less knee pain PT Goal Formulation: With patient Time For Goal Achievement: 05/11/21 Potential to Achieve Goals: Good Progress towards PT goals: Progressing toward goals    Frequency    7X/week  PT Plan Current plan remains appropriate    Co-evaluation              AM-PAC PT "6 Clicks" Mobility   Outcome Measure  Help needed turning from your back to your side while in a flat bed without using bedrails?: A Little Help needed moving from lying on your back to sitting on the side of a flat bed without using bedrails?: A Lot Help needed moving to and from a bed to a chair (including a wheelchair)?: A Lot Help needed standing up from a chair using your arms (e.g., wheelchair or bedside chair)?: A Lot Help needed to walk in hospital room?: A Little Help needed climbing 3-5 steps with a railing? : A Lot 6 Click Score: 14    End  of Session Equipment Utilized During Treatment: Gait belt;Right knee immobilizer Activity Tolerance: Patient tolerated treatment well Patient left: with call bell/phone within reach;in chair;with chair alarm set;with family/visitor present   PT Visit Diagnosis: Other abnormalities of gait and mobility (R26.89);Difficulty in walking, not elsewhere classified (R26.2)     Time: 1696-7893 PT Time Calculation (min) (ACUTE ONLY): 24 min  Charges:  $Gait Training: 8-22 mins $Therapeutic Activity: 8-22 mins                     Delice Bison, PT  Acute Rehab Dept ([facility]) [phone] Pager [phone]  05/06/2021    [facility] 05/06/2021, 12:55 PM

## 2021-05-06 NOTE — Progress Notes (Signed)
   Subjective:  No acute events overnight.  Resting comfortably this AM. Pain well controlled  Objective:   VITALS:   Vitals:   05/05/21 0955 05/05/21 1432 05/05/21 2141 05/06/21 0605  BP: 123/74 135/88 (!) 155/73 113/60  Pulse: 73 80 83 95  Resp: 16 18 16 16   Temp: 98.1 F (36.7 C) 98.2 F (36.8 C) 97.7 F (36.5 C) 99.2 F (37.3 C)  TempSrc: Oral Oral Oral Oral  SpO2: 96% 96% 97% 93%  Weight:      Height:        Gen: Resting comfortably Pulm: Normal WOB on RA CV: BLE warm and well perfused    Lab Results  Component Value Date   WBC 5.8 05/05/2021   HGB 9.2 (L) 05/05/2021   HCT 28.3 (L) 05/05/2021   MCV 104.8 (H) 05/05/2021   PLT 217 05/05/2021     Assessment/Plan:  2 Days Post-Op s/p L TKA.  Doing well postoperatively  - Difficulty with PT yesterday - Possible DC today pending PT clearance    Abby Tucholski 05/06/2021, 10:51 AM [phone]

## 2021-05-06 NOTE — Plan of Care (Signed)
  Problem: Pain Management: Goal: Pain level will decrease with appropriate interventions Outcome: Progressing   Problem: Coping: Goal: Level of anxiety will decrease Outcome: Progressing   

## 2021-05-07 ENCOUNTER — Encounter (HOSPITAL_COMMUNITY): Payer: Self-pay | Admitting: Orthopaedic Surgery

## 2021-05-07 DIAGNOSIS — Z96652 Presence of left artificial knee joint: Secondary | ICD-10-CM | POA: Diagnosis not present

## 2021-05-07 DIAGNOSIS — M1711 Unilateral primary osteoarthritis, right knee: Secondary | ICD-10-CM | POA: Diagnosis not present

## 2021-05-07 DIAGNOSIS — Z79899 Other long term (current) drug therapy: Secondary | ICD-10-CM | POA: Diagnosis not present

## 2021-05-07 DIAGNOSIS — Z87891 Personal history of nicotine dependence: Secondary | ICD-10-CM | POA: Diagnosis not present

## 2021-05-07 DIAGNOSIS — I10 Essential (primary) hypertension: Secondary | ICD-10-CM | POA: Diagnosis not present

## 2021-05-07 NOTE — Discharge Summary (Signed)
Patient ID: Leslie [name] MRN: [id number] DOB/AGE: [birth year] 70 y.o.  Admit date: 05/04/2021 Discharge date: 05/07/2021  Admission Diagnoses:  Principal Problem:   Unilateral primary osteoarthritis, right knee Active Problems:   Status post right knee replacement   Discharge Diagnoses:  Same  Past Medical History:  Diagnosis Date   ADD (attention deficit disorder)    Allergy    Anemia    Arthritis 07/22/2014   osteoarthritis left knee   Colon polyps    Hypertension    Left knee pain    chronic   Narcotic addiction (HCC)    Obesity    post gastric bypass   Vitamin D deficiency     Surgeries: Procedure(s): RIGHT TOTAL KNEE ARTHROPLASTY on 05/04/2021   Consultants:   Discharged Condition: Improved  Hospital Course: Leslie [name] is an 70 y.o. female who was admitted 05/04/2021 for operative treatment ofUnilateral primary osteoarthritis, right knee. Patient has severe unremitting pain that affects sleep, daily activities, and work/hobbies. After pre-op clearance the patient was taken to the operating room on 05/04/2021 and underwent  Procedure(s): RIGHT TOTAL KNEE ARTHROPLASTY.    Patient was given perioperative antibiotics:  Anti-infectives (From admission, onward)    Start     Dose/Rate Route Frequency Ordered Stop   05/04/21 1800  clindamycin (CLEOCIN) IVPB 600 mg        600 mg 100 mL/hr over 30 Minutes Intravenous Every 6 hours 05/04/21 1534 05/05/21 0011   05/04/21 0845  clindamycin (CLEOCIN) IVPB 900 mg        900 mg 100 mL/hr over 30 Minutes Intravenous On call to O.R. 05/04/21 6734 05/04/21 1203        Patient was given sequential compression devices, early ambulation, and chemoprophylaxis to prevent DVT.  Patient benefited maximally from hospital stay and there were no complications.    Recent vital signs: Patient Vitals for the past 24 hrs:  BP Temp Temp src Pulse Resp SpO2  05/07/21 0516 (!) 126/59 98.6 F (37 C) Oral 82 16 95 %   05/06/21 2209 (!) 138/59 98.5 F (36.9 C) Oral 80 16 96 %  05/06/21 1338 140/65 98.6 F (37 C) Oral 92 18 (!) 64 %     Recent laboratory studies:  Recent Labs    05/05/21 0316  WBC 5.8  HGB 9.2*  HCT 28.3*  PLT 217  NA 131*  K 3.3*  CL 97*  CO2 27  BUN 18  CREATININE 1.05*  GLUCOSE 123*  CALCIUM 8.3*     Discharge Medications:   Allergies as of 05/07/2021       Reactions   Amoxicillin    Hives   Sulfamethoxazole Nausea And Vomiting   See patient list of med intolerances due to h/o gastric bypass and nephrectomy        Medication List     TAKE these medications    ALPRAZolam 0.25 MG tablet Commonly known as: XANAX TAKE 1 TABLET(0.25 MG) BY MOUTH AT BEDTIME AS NEEDED FOR ANXIETY   amphetamine-dextroamphetamine 30 MG 24 hr capsule Commonly known as: ADDERALL XR Take 1 capsule (30 mg total) by mouth every morning. FILL IN ONE MONTH   amphetamine-dextroamphetamine 20 MG tablet Commonly known as: Adderall Take 1/2 to 1 tablet by mouth every evening if needed.   aspirin 81 MG chewable tablet Chew 1 tablet (81 mg total) by mouth 2 (two) times daily.   DRY EYE RELIEF DROPS OP Place 1 drop into both eyes daily as needed (Dry eye).  FLUoxetine 40 MG capsule Commonly known as: PROZAC TAKE 1 CAPSULE(40 MG) BY MOUTH EVERY MORNING   gabapentin 100 MG capsule Commonly known as: NEURONTIN Take 1 capsule (100 mg total) by mouth 3 (three) times daily.   hydrochlorothiazide 25 MG tablet Commonly known as: HYDRODIURIL TAKE 1 TABLET(25 MG) BY MOUTH DAILY   HYDROmorphone 4 MG tablet Commonly known as: DILAUDID Take 1 tablet (4 mg total) by mouth every 4 (four) hours as needed for severe pain.   lamoTRIgine 100 MG tablet Commonly known as: LAMICTAL TAKE 1 TABLET(100 MG) BY MOUTH TWICE DAILY   methocarbamol 500 MG tablet Commonly known as: ROBAXIN Take 1 tablet (500 mg total) by mouth every 6 (six) hours as needed for muscle spasms.   tiZANidine 2 MG  tablet Commonly known as: ZANAFLEX TAKE 1 TABLET(2 MG) BY MOUTH THREE TIMES DAILY   triamcinolone 55 MCG/ACT Aero nasal inhaler Commonly known as: NASACORT Place 2 sprays into the nose daily. What changed:  when to take this reasons to take this   Vitamin D (Ergocalciferol) 1.25 MG (50000 UNIT) Caps capsule Commonly known as: DRISDOL Take 1 capsule (50,000 Units total) by mouth every 7 (seven) days for 12 doses.   zolpidem 10 MG tablet Commonly known as: AMBIEN Take 1 tablet (10 mg total) by mouth at bedtime.               Durable Medical Equipment  (From admission, onward)           Start     Ordered   05/04/21 1535  DME 3 n 1  Once        05/04/21 1534   05/04/21 1535  DME Walker rolling  Once       Question Answer Comment  Walker: With 5 Inch Wheels   Patient needs a walker to treat with the following condition Status post right knee replacement      05/04/21 1534            Diagnostic Studies: DG Knee Right Port  Result Date: 05/04/2021 CLINICAL DATA:  Status post right knee replacement EXAM: PORTABLE RIGHT KNEE - 1-2 VIEW COMPARISON:  None. FINDINGS: Status post right knee total arthroplasty with expected overlying postoperative change. No evidence of perihardware fracture or component malpositioning. IMPRESSION: Status post right knee total arthroplasty with expected overlying postoperative change. No evidence of perihardware fracture or component malpositioning. Electronically Signed   By: Jearld Lesch M.D.   On: 05/04/2021 14:39    Disposition: Discharge disposition: 01-Home or Self Care          Follow-up Information     Kathryne Hitch, MD Follow up in 2 week(s).   Specialty: Orthopedic Surgery Contact information: [address] [place] [address] [phone]                  Signed: Kathryne Hitch 05/07/2021, 7:49 AM

## 2021-05-07 NOTE — Care Management Obs Status (Signed)
MEDICARE OBSERVATION STATUS NOTIFICATION   Patient Details  Name: Leslie [name] MRN: [id number] Date of Birth: 06-18-1951   Medicare Observation Status Notification Given:  Yes    Darleene Cleaver, [place] 05/06/21

## 2021-05-07 NOTE — Progress Notes (Signed)
Physical Therapy Treatment Patient Details Name: Leslie [name] MRN: [id number] DOB: 1950/12/07 Today's Date: 05/07/2021   History of Present Illness s/p R TKA. PMH: L TKA, ADHD, substance, gastric bypass    PT Comments    Pt progressing well today. Incr gait distance, quad control/activation much improved; she has been doing exercises and working R knee flexion on her own.  Did well with stair training. Pt is going to her dtr Belina Mandile's house since her grandchild now has covid at her other dtr's home; ready for d/c with family assist as needed from PT standpoint.    Recommendations for follow up therapy are one component of a multi-disciplinary discharge planning process, led by the attending physician.  Recommendations may be updated based on patient status, additional functional criteria and insurance authorization.  Follow Up Recommendations  Follow surgeon's recommendation for DC plan and follow-up therapies     Equipment Recommendations  None recommended by PT    Recommendations for Other Services       Precautions / Restrictions Precautions Precautions: Fall;Knee Required Braces or Orthoses: Knee Immobilizer - Right Knee Immobilizer - Right: Discontinue once straight leg raise with < 10 degree lag Restrictions Weight Bearing Restrictions: No Other Position/Activity Restrictions: WBAT     Mobility  Bed Mobility Overal bed mobility: Needs Assistance Bed Mobility: Supine to Sit     Supine to sit: Supervision;Modified independent (Device/Increase time)          Transfers Overall transfer level: Needs assistance Equipment used: Rolling walker (2 wheeled) Transfers: Sit to/from Stand Sit to Stand: Min guard         General transfer comment: cues for safety, hand placement, no physical assist  Ambulation/Gait Ambulation/Gait assistance: Min guard Gait Distance (Feet): 70 Feet Assistive device: Rolling walker (2 wheeled) Gait Pattern/deviations: Step-to  pattern;Decreased step length - right;Decreased step length - left;Decreased stance time - right     General Gait Details: cues for sequence, trunk/hip extension, RW position/distance from self  and step length. min/guard for safety   Stairs Stairs: Yes Stairs assistance: Min assist;Min guard Stair Management: Step to pattern;With walker;No rails;Backwards Number of Stairs: 2 General stair comments: cues for  sequence and technique, min/guard for safety/assist to stabilize RW   Wheelchair Mobility    Modified Rankin (Stroke Patients Only)       Balance                                            Cognition Arousal/Alertness: Awake/alert Behavior During Therapy: WFL for tasks assessed/performed Overall Cognitive Status: Within Functional Limits for tasks assessed                                        Exercises      General Comments        Pertinent Vitals/Pain Pain Assessment: 0-10 Pain Score: 5  Pain Location: right knee Pain Descriptors / Indicators: Aching;Discomfort;Grimacing;Guarding Pain Intervention(s): Limited activity within patient's tolerance;Monitored during session;Premedicated before session;Repositioned;Ice applied    Home Living                      Prior Function            PT Goals (current goals can now be found in the care plan section)  Acute Rehab PT Goals Patient Stated Goal: home soon, less knee pain PT Goal Formulation: With patient Time For Goal Achievement: 05/11/21 Potential to Achieve Goals: Good Progress towards PT goals: Progressing toward goals    Frequency    7X/week      PT Plan Current plan remains appropriate    Co-evaluation              AM-PAC PT "6 Clicks" Mobility   Outcome Measure  Help needed turning from your back to your side while in a flat bed without using bedrails?: A Little Help needed moving from lying on your back to sitting on the side of a flat  bed without using bedrails?: A Lot Help needed moving to and from a bed to a chair (including a wheelchair)?: A Lot Help needed standing up from a chair using your arms (e.g., wheelchair or bedside chair)?: A Lot Help needed to walk in hospital room?: A Little Help needed climbing 3-5 steps with a railing? : A Lot 6 Click Score: 14    End of Session Equipment Utilized During Treatment: Gait belt Activity Tolerance: Patient tolerated treatment well Patient left: with call bell/phone within reach;in chair;with chair alarm set;with family/visitor present   PT Visit Diagnosis: Other abnormalities of gait and mobility (R26.89);Difficulty in walking, not elsewhere classified (R26.2)     Time: 9417-4081 PT Time Calculation (min) (ACUTE ONLY): 26 min  Charges:  $Gait Training: 23-37 mins                     Delice Bison, PT  Acute Rehab Dept ([facility]) [phone] Pager [phone]  05/07/2021    Drucilla Chalet 05/07/2021, 1:24 PM

## 2021-05-07 NOTE — Progress Notes (Signed)
Patient sleeping soundly at this time, respirations easy and unlabored, no acute distress noted, daughter sleeping by window, will continue to monitor.

## 2021-05-07 NOTE — Progress Notes (Signed)
Patient ID: Leslie [name], female   DOB: 13-Oct-1950, 70 y.o.   MRN: [id number] Awake and alert this am.  Seems motivated to get home this afternoon.  Vitals stable and right knee stable.  Will try to discharge to home today after PT session.

## 2021-05-07 NOTE — Plan of Care (Signed)
  Problem: Pain Managment: Goal: General experience of comfort will improve Outcome: Progressing   

## 2021-05-07 NOTE — Plan of Care (Signed)
  Problem: Activity: Goal: Ability to avoid complications of mobility impairment will improve Outcome: Progressing Goal: Range of joint motion will improve Outcome: Progressing   Problem: Pain Management: Goal: Pain level will decrease with appropriate interventions Outcome: Progressing   

## 2021-05-07 NOTE — TOC Transition Note (Signed)
Transition of Care [facility]) - CM/SW Discharge Note  Patient Details  Name: Leslie [name] MRN: [id number] Date of Birth: [birth year]  [facility] of [facility]) CM/SW Contact:  Sherie Don, LCSW Phone Number: 05/07/2021, 9:42 AM  Clinical Narrative: Patient is expected to discharge home after working with PT. CSW met with patient to confirm discharge plan. Patient will discharge home with HHPT through [facility]. Patient has a rolling walker and cane at home. Patient declined 3N1 as she has higher toilets. TOC signing off.  Final next level of care: [place] Barriers to Discharge: No Barriers Identified  Patient Goals and CMS Choice Patient states their goals for this hospitalization and ongoing recovery are:: Discharge home with Short CMS Medicare.gov Compare Post Acute Care list provided to:: Patient Choice offered to / list presented to : Patient  Discharge Plan and Services         DME Arranged: N/A DME Agency: NA HH Arranged: PT HH Agency: [place] Representative spoke with at [facility] in orthopedist's office  Readmission Risk Interventions No flowsheet data found.

## 2021-05-08 ENCOUNTER — Telehealth: Payer: Self-pay | Admitting: *Deleted

## 2021-05-08 NOTE — Telephone Encounter (Signed)
Attempted Ortho bundle D/C call after discharge on 05/07/21. No answer and left VM requesting call back.

## 2021-05-09 ENCOUNTER — Telehealth: Payer: Self-pay | Admitting: *Deleted

## 2021-05-09 DIAGNOSIS — G8929 Other chronic pain: Secondary | ICD-10-CM | POA: Diagnosis not present

## 2021-05-09 DIAGNOSIS — G479 Sleep disorder, unspecified: Secondary | ICD-10-CM | POA: Diagnosis not present

## 2021-05-09 DIAGNOSIS — Z471 Aftercare following joint replacement surgery: Secondary | ICD-10-CM | POA: Diagnosis not present

## 2021-05-09 DIAGNOSIS — D649 Anemia, unspecified: Secondary | ICD-10-CM | POA: Diagnosis not present

## 2021-05-09 DIAGNOSIS — M25512 Pain in left shoulder: Secondary | ICD-10-CM | POA: Diagnosis not present

## 2021-05-09 DIAGNOSIS — E669 Obesity, unspecified: Secondary | ICD-10-CM | POA: Diagnosis not present

## 2021-05-09 DIAGNOSIS — I10 Essential (primary) hypertension: Secondary | ICD-10-CM | POA: Diagnosis not present

## 2021-05-09 DIAGNOSIS — F909 Attention-deficit hyperactivity disorder, unspecified type: Secondary | ICD-10-CM | POA: Diagnosis not present

## 2021-05-09 DIAGNOSIS — M25511 Pain in right shoulder: Secondary | ICD-10-CM | POA: Diagnosis not present

## 2021-05-09 NOTE — Telephone Encounter (Signed)
Ortho bundle D/C call completed. 

## 2021-05-10 DIAGNOSIS — E669 Obesity, unspecified: Secondary | ICD-10-CM | POA: Diagnosis not present

## 2021-05-10 DIAGNOSIS — M25511 Pain in right shoulder: Secondary | ICD-10-CM | POA: Diagnosis not present

## 2021-05-10 DIAGNOSIS — M25512 Pain in left shoulder: Secondary | ICD-10-CM | POA: Diagnosis not present

## 2021-05-10 DIAGNOSIS — F909 Attention-deficit hyperactivity disorder, unspecified type: Secondary | ICD-10-CM | POA: Diagnosis not present

## 2021-05-10 DIAGNOSIS — Z471 Aftercare following joint replacement surgery: Secondary | ICD-10-CM | POA: Diagnosis not present

## 2021-05-10 DIAGNOSIS — I10 Essential (primary) hypertension: Secondary | ICD-10-CM | POA: Diagnosis not present

## 2021-05-10 DIAGNOSIS — G479 Sleep disorder, unspecified: Secondary | ICD-10-CM | POA: Diagnosis not present

## 2021-05-10 DIAGNOSIS — D649 Anemia, unspecified: Secondary | ICD-10-CM | POA: Diagnosis not present

## 2021-05-10 DIAGNOSIS — G8929 Other chronic pain: Secondary | ICD-10-CM | POA: Diagnosis not present

## 2021-05-11 ENCOUNTER — Other Ambulatory Visit: Payer: Self-pay | Admitting: Orthopaedic Surgery

## 2021-05-11 MED ORDER — HYDROMORPHONE HCL 4 MG PO TABS: 4.0000 mg | ORAL_TABLET | ORAL | 0 refills | Status: DC | PRN

## 2021-05-14 DIAGNOSIS — G8929 Other chronic pain: Secondary | ICD-10-CM | POA: Diagnosis not present

## 2021-05-14 DIAGNOSIS — E669 Obesity, unspecified: Secondary | ICD-10-CM | POA: Diagnosis not present

## 2021-05-14 DIAGNOSIS — D649 Anemia, unspecified: Secondary | ICD-10-CM | POA: Diagnosis not present

## 2021-05-14 DIAGNOSIS — G479 Sleep disorder, unspecified: Secondary | ICD-10-CM | POA: Diagnosis not present

## 2021-05-14 DIAGNOSIS — I10 Essential (primary) hypertension: Secondary | ICD-10-CM | POA: Diagnosis not present

## 2021-05-14 DIAGNOSIS — M25511 Pain in right shoulder: Secondary | ICD-10-CM | POA: Diagnosis not present

## 2021-05-14 DIAGNOSIS — Z471 Aftercare following joint replacement surgery: Secondary | ICD-10-CM | POA: Diagnosis not present

## 2021-05-14 DIAGNOSIS — F909 Attention-deficit hyperactivity disorder, unspecified type: Secondary | ICD-10-CM | POA: Diagnosis not present

## 2021-05-14 DIAGNOSIS — M25512 Pain in left shoulder: Secondary | ICD-10-CM | POA: Diagnosis not present

## 2021-05-15 ENCOUNTER — Telehealth: Payer: Self-pay | Admitting: *Deleted

## 2021-05-15 DIAGNOSIS — E669 Obesity, unspecified: Secondary | ICD-10-CM | POA: Diagnosis not present

## 2021-05-15 DIAGNOSIS — M25512 Pain in left shoulder: Secondary | ICD-10-CM | POA: Diagnosis not present

## 2021-05-15 DIAGNOSIS — F909 Attention-deficit hyperactivity disorder, unspecified type: Secondary | ICD-10-CM | POA: Diagnosis not present

## 2021-05-15 DIAGNOSIS — G8929 Other chronic pain: Secondary | ICD-10-CM | POA: Diagnosis not present

## 2021-05-15 DIAGNOSIS — M25511 Pain in right shoulder: Secondary | ICD-10-CM | POA: Diagnosis not present

## 2021-05-15 DIAGNOSIS — D649 Anemia, unspecified: Secondary | ICD-10-CM | POA: Diagnosis not present

## 2021-05-15 DIAGNOSIS — Z471 Aftercare following joint replacement surgery: Secondary | ICD-10-CM | POA: Diagnosis not present

## 2021-05-15 DIAGNOSIS — I10 Essential (primary) hypertension: Secondary | ICD-10-CM | POA: Diagnosis not present

## 2021-05-15 DIAGNOSIS — G479 Sleep disorder, unspecified: Secondary | ICD-10-CM | POA: Diagnosis not present

## 2021-05-15 NOTE — Telephone Encounter (Signed)
7 day post op call completed. 

## 2021-05-16 ENCOUNTER — Other Ambulatory Visit: Payer: Self-pay | Admitting: Orthopaedic Surgery

## 2021-05-16 DIAGNOSIS — I10 Essential (primary) hypertension: Secondary | ICD-10-CM | POA: Diagnosis not present

## 2021-05-16 DIAGNOSIS — Z471 Aftercare following joint replacement surgery: Secondary | ICD-10-CM | POA: Diagnosis not present

## 2021-05-16 DIAGNOSIS — M25511 Pain in right shoulder: Secondary | ICD-10-CM | POA: Diagnosis not present

## 2021-05-16 DIAGNOSIS — D649 Anemia, unspecified: Secondary | ICD-10-CM | POA: Diagnosis not present

## 2021-05-16 DIAGNOSIS — M25512 Pain in left shoulder: Secondary | ICD-10-CM | POA: Diagnosis not present

## 2021-05-16 DIAGNOSIS — E669 Obesity, unspecified: Secondary | ICD-10-CM | POA: Diagnosis not present

## 2021-05-16 DIAGNOSIS — G479 Sleep disorder, unspecified: Secondary | ICD-10-CM | POA: Diagnosis not present

## 2021-05-16 DIAGNOSIS — G8929 Other chronic pain: Secondary | ICD-10-CM | POA: Diagnosis not present

## 2021-05-16 DIAGNOSIS — F909 Attention-deficit hyperactivity disorder, unspecified type: Secondary | ICD-10-CM | POA: Diagnosis not present

## 2021-05-16 MED ORDER — OXYCODONE HCL 5 MG PO TABS: 5.0000 mg | ORAL_TABLET | ORAL | 0 refills | Status: DC | PRN

## 2021-05-17 ENCOUNTER — Ambulatory Visit (INDEPENDENT_AMBULATORY_CARE_PROVIDER_SITE_OTHER): Payer: Medicare PPO

## 2021-05-17 ENCOUNTER — Encounter: Payer: Self-pay | Admitting: Physician Assistant

## 2021-05-17 ENCOUNTER — Ambulatory Visit: Payer: Medicare PPO | Admitting: Rehabilitative and Restorative Service Providers"

## 2021-05-17 ENCOUNTER — Ambulatory Visit (INDEPENDENT_AMBULATORY_CARE_PROVIDER_SITE_OTHER): Payer: Medicare PPO | Admitting: Physician Assistant

## 2021-05-17 ENCOUNTER — Other Ambulatory Visit: Payer: Self-pay

## 2021-05-17 DIAGNOSIS — M5441 Lumbago with sciatica, right side: Secondary | ICD-10-CM

## 2021-05-17 DIAGNOSIS — Z96651 Presence of right artificial knee joint: Secondary | ICD-10-CM

## 2021-05-17 DIAGNOSIS — G8929 Other chronic pain: Secondary | ICD-10-CM

## 2021-05-17 MED ORDER — CLINDAMYCIN HCL 150 MG PO CAPS
ORAL_CAPSULE | ORAL | 0 refills | Status: DC
Start: 2021-05-17 — End: 2021-06-28

## 2021-05-17 NOTE — Progress Notes (Signed)
Office Visit Note   Patient: Leslie [name]           Date of Birth: [birth year]           MRN: [id number] Visit Date: 05/17/2021              Requested by: Philip Aspen, Leslie Patricia, MD [address] [place],  [place] [address] PCP: Philip Aspen, Leslie Patricia, MD   Assessment & Plan: Visit Diagnoses:  1. 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 Status post right knee replacement     Plan: Due to her severe radicular pain down the left leg recommend MRI to rule out HNP as a source of her back pain and also for epidural steroid planning.  In regards to her knee she has formal therapy set up to work on range of motion and strengthening Staples were removed today Steri-Strips applied.  Scar tissue mobilization encouraged.  Did send in clindamycin due to her upcoming dental work.  She will continue on 81 mg aspirin once daily for another week and then discontinue as she was on no aspirin prior to surgery.  Follow-Up Instructions: Return in about 4 weeks (around 06/14/2021).   Orders:  Orders Placed This Encounter  Procedures   XR Knee 1-2 Views Right   XR Lumbar Spine 2-3 Views   No orders of the defined types were placed in this encounter.     Procedures: No procedures performed   Clinical Data: No additional findings.   Subjective: Chief Complaint  Patient presents with   Right Knee - Routine Post Op   Lower Back - Pain    HPI [name] returns today 2 weeks status post right total knee arthroplasty.  She states her knee is doing well.  Main complaint today is low back pain with radicular symptoms down the left leg that goes into her foot.  States it is worse in her left great toe.  She denies any bowel bladder dysfunction saddle anesthesia symptoms.  Denies any fevers or chills.  She is on aspirin twice daily she was on no aspirin prior to surgery. Review of Systems See HPI otherwise negative  Objective: Vital Signs: There were  no vitals taken for this visit.  Physical Exam General: Well-developed well-nourished female no acute distress mood and affect appropriate. Ortho Exam Right knee surgical incisions healing well well approximated staples no signs of infection right calf supple nontender.  Full extension right knee flexion to approximately 100 degrees.  Calf supple nontender. Lower extremities 5 out of 5 strength throughout.  Negative straight leg raise bilaterally. Specialty Comments:  No specialty comments available.  Imaging: XR Knee 1-2 Views Right  Result Date: 05/17/2021 Right knee 2 views: Knee is well located.  No acute fractures.  Well-seated total knee arthroplasty components.  XR Lumbar Spine 2-3 Views  Result Date: 05/17/2021 Lumbar spine 2 views: No acute fractures.  Grade 1 spondylolisthesis L4 4 on 5.  Facet arthritis lower lumbar region.  Mild endplate spurring noted to the lower lumbar spine.  Arthrosclerosis aorta is noted.    PMFS History: Patient Active Problem List   Diagnosis Date Noted   Status post right knee replacement 05/04/2021   Vitamin B12 deficiency 03/14/2021   Macrocytosis without anemia 03/14/2021   Vitamin D deficiency 02/17/2020   Sleep disorder 07/10/2018   ADHD (attention deficit hyperactivity disorder) 10/01/2017   Unilateral primary osteoarthritis, right knee 08/20/2017   Chronic pain of both shoulders  08/20/2017   Complete tear of right rotator cuff 04/29/2017   Osteoarthritis of left knee 06/16/2015   Status post total left knee replacement 06/16/2015   SUBSTANCE ABUSE 02/06/2009   Attention deficit disorder 09/14/2007   COLONIC POLYPS, HX OF 08/03/2007   Essential hypertension 01/20/2007   OBESITY NOS 12/04/2006   Allergic rhinitis 12/04/2006   GASTROJEJUNOSTOMY, HX OF 12/04/2006   BREAST BIOPSY, HX OF 12/04/2006   Past Medical History:  Diagnosis Date   ADD (attention deficit disorder)    Allergy    Anemia    Arthritis 07/22/2014    osteoarthritis left knee   Colon polyps    Hypertension    Left knee pain    chronic   Narcotic addiction (HCC)    Obesity    post gastric bypass   Vitamin D deficiency     Family History  Problem Relation Age of Onset   Heart disease Mother        post CABG history of CHF   COPD Father    Diabetes Sister    Hypertension Sister        post renal transplant   Heart disease Brother        CAD    Past Surgical History:  Procedure Laterality Date   arthscopic knee surgery Left    several times   BREAST BIOPSY     BREAST SURGERY  years ago   breast biopsy   CHOLECYSTECTOMY     EYE SURGERY Bilateral 2013   Toric Lens implants   EYE SURGERY Right 2014   corneal amniotic membrane   GASTRIC BYPASS  2003   KIDNEY DONATION  2004   TOTAL KNEE ARTHROPLASTY Left 06/16/2015   Procedure: LEFT TOTAL KNEE ARTHROPLASTY;  Surgeon: Kathryne Hitch, MD;  Location: [facility];  Service: Orthopedics;  Laterality: Left;   TOTAL KNEE ARTHROPLASTY Right 05/04/2021   Procedure: RIGHT TOTAL KNEE ARTHROPLASTY;  Surgeon: Kathryne Hitch, MD;  Location: [facility];  Service: Orthopedics;  Laterality: Right;   Social History   Occupational History   Not on file  Tobacco Use   Smoking status: Former    Types: Cigarettes    Quit date: 07/22/1992    Years since quitting: 28.8   Smokeless tobacco: Never  Vaping Use   Vaping Use: Never used  Substance and Sexual Activity   Alcohol use: Yes    Comment: wine weekly   Drug use: No   Sexual activity: Not on file

## 2021-05-18 ENCOUNTER — Telehealth: Payer: Self-pay | Admitting: *Deleted

## 2021-05-18 NOTE — Telephone Encounter (Signed)
Ortho bundle 14 day call completed. 

## 2021-05-18 NOTE — Addendum Note (Signed)
Addended by: Fredda Hammed L on: 05/18/2021 11:08 AM   Modules accepted: Orders

## 2021-05-21 ENCOUNTER — Other Ambulatory Visit: Payer: Self-pay | Admitting: Orthopaedic Surgery

## 2021-05-21 MED ORDER — OXYCODONE HCL 5 MG PO TABS: 5.0000 mg | ORAL_TABLET | ORAL | 0 refills | Status: DC | PRN

## 2021-05-24 ENCOUNTER — Other Ambulatory Visit: Payer: Self-pay

## 2021-05-24 ENCOUNTER — Encounter: Payer: Self-pay | Admitting: Rehabilitative and Restorative Service Providers"

## 2021-05-24 ENCOUNTER — Other Ambulatory Visit: Payer: Self-pay | Admitting: Orthopaedic Surgery

## 2021-05-24 ENCOUNTER — Ambulatory Visit: Payer: Medicare PPO | Admitting: Rehabilitative and Restorative Service Providers"

## 2021-05-24 DIAGNOSIS — R600 Localized edema: Secondary | ICD-10-CM | POA: Diagnosis not present

## 2021-05-24 DIAGNOSIS — R262 Difficulty in walking, not elsewhere classified: Secondary | ICD-10-CM | POA: Diagnosis not present

## 2021-05-24 DIAGNOSIS — M6281 Muscle weakness (generalized): Secondary | ICD-10-CM

## 2021-05-24 DIAGNOSIS — M25561 Pain in right knee: Secondary | ICD-10-CM

## 2021-05-24 DIAGNOSIS — M25661 Stiffness of right knee, not elsewhere classified: Secondary | ICD-10-CM

## 2021-05-24 MED ORDER — OXYCODONE HCL 5 MG PO TABS: 5.0000 mg | ORAL_TABLET | Freq: Four times a day (QID) | ORAL | 0 refills | Status: DC | PRN

## 2021-05-24 NOTE — Therapy (Signed)
[facility] Physical Therapy [address] [place], [place], [address] Phone: [phone]   Fax:  [phone]  Physical Therapy Evaluation  Patient Details  Name: LAKIESHA RALPHS MRN: [id number] Date of Birth: 1950-10-26 Referring Provider (PT): Kathryne Hitch MD  Referring diagnosis? M17.11 Treatment diagnosis? (if different than referring diagnosis) R26.2  M62.81  R60.0  M25.661  M25.561 What was this (referring dx) caused by? [x]  Surgery []  Fall []  Ongoing issue [x]  Arthritis []  Other: ____________  Laterality: [x]  Rt []  Lt []  Both  Check all possible CPT codes:      [x]  97110 (Therapeutic Exercise)  []  92507 (SLP Treatment)  [x]  (Neuro Re-ed)   []  92526 (Swallowing Treatment)   [x]  97116 (Gait Training)   []  (Cognitive Training, 1st 15 minutes) [x]  97140 (Manual Therapy)   []  97130 (Cognitive Training, each add'l 15 minutes)  [x]  97530 (Therapeutic Activities)  []  Other, List CPT Code ____________    [x]  97535 (Self Care)       [x]  All codes above (97110 - 97535)  []  97012 (Mechanical Traction)  [x]  97014 (E-stim Unattended)  []  97032 (E-stim manual)  [x]  97033 (Ionto)  []  97035 (Ultrasound)  []  97760 (Orthotic Fit) [x]  97750 (Physical Performance Training) []  (Aquatic Therapy) []  97034 (Contrast Bath) []  (Paraffin) []  97597 (Wound Care 1st 20 sq cm) []  97598 (Wound Care each add'l 20 sq cm) [x]  97016 (Vasopneumatic Device) []  (Orthotic Training) []  (Prosthetic Training)  Encounter Date: 05/24/2021   PT End of Session - 05/24/21 1741     Visit Number 1    Number of Visits 16    Date for PT Re-Evaluation 07/20/21    Authorization Type [employer]    Progress Note Due on Visit 10    PT Start Time 1145    PT Stop Time 1230    PT Time Calculation (min) 45 min    Activity Tolerance Patient limited by pain    Behavior During Therapy [facility] for tasks assessed/performed             Past Medical  History:  Diagnosis Date   ADD (attention deficit disorder)    Allergy    Anemia    Arthritis 07/22/2014   osteoarthritis left knee   Colon polyps    Hypertension    Left knee pain    chronic   Narcotic addiction (HCC)    Obesity    post gastric bypass   Vitamin D deficiency     Past Surgical History:  Procedure Laterality Date   arthscopic knee surgery Left    several times   BREAST BIOPSY     BREAST SURGERY  years ago   breast biopsy   CHOLECYSTECTOMY     EYE SURGERY Bilateral 2013   Toric Lens implants   EYE SURGERY Right 2014   corneal amniotic membrane   GASTRIC BYPASS  2003   KIDNEY DONATION  2004   TOTAL KNEE ARTHROPLASTY Left 06/16/2015   Procedure: LEFT TOTAL KNEE ARTHROPLASTY;  Surgeon: [id number], MD;  Location: [facility];  Service: Orthopedics;  Laterality: Left;   TOTAL KNEE ARTHROPLASTY Right 05/04/2021   Procedure: RIGHT TOTAL KNEE ARTHROPLASTY;  Surgeon: , MD;  Location: [facility];  Service: Orthopedics;  Laterality: Right;    There were no vitals filed for this visit.    Subjective Assessment - 05/24/21 1737     Subjective Adan is < 3 weeks s/p R knee TKA.  She is very motivated to get back home where she lives alone and be independent.  She is living with her daughter now.    Pertinent History HTN, R sciatica, previous L TKA    Limitations Sitting;Walking;Lifting;House hold activities;Standing    How long can you sit comfortably? Stiff with sit to stand transitions and very limited due to R sciatica    How long can you stand comfortably? < 5 minutes due to R sciatica    How long can you walk comfortably? < 5 minutes with walker due to R sciatica    Patient Stated Goals Get back home and be independent    Currently in Pain? Yes    Pain Score 6     Pain Location Leg    Pain Orientation Right    Pain Descriptors / Indicators Aching;Shooting;Burning;Constant;Radiating    Pain Type Chronic pain    Pain Radiating  Towards R foot    Pain Onset More than a month ago    Pain Frequency Intermittent    Aggravating Factors  Sitting and too much WB    Pain Relieving Factors Lie supine and change position    Effect of Pain on Daily Activities Uses walker to stand and walk, can't sit long, living with daughter until strong and healthy to return home alone.                [facility] PT Assessment - 05/24/21 0001       Assessment   Medical Diagnosis s/p R TKA 05/04/2021    Referring Provider (PT) Kathryne Hitch MD    Onset Date/Surgical Date 05/04/21    Next MD Visit Late November      Precautions   Precautions None      Restrictions   Weight Bearing Restrictions No    Other Position/Activity Restrictions WBAT      Balance Screen   Has the patient fallen in the past 6 months Yes    How many times? 1    Has the patient had a decrease in activity level because of a fear of falling?  No    Is the patient reluctant to leave their home because of a fear of falling?  No      Home Nurse, mental health Private residence    Living Arrangements Children    Available Help at Discharge Family    Type of Home House    Additional Comments Stairs 5-6 does OK with rails      Prior Function   Level of Independence Independent    Leisure Walking, live independently      Cognition   Overall Cognitive Status Within Functional Limits for tasks assessed      Observation/Other Assessments   Focus on Therapeutic Outcomes (FOTO)  47 (Goal 60 in 15 visits)      ROM / Strength   AROM / PROM / Strength AROM;Strength      AROM   Overall AROM  Deficits    AROM Assessment Site Knee    Right/Left Knee Left;Right    Right Knee Extension -6    Right Knee Flexion 112    Left Knee Extension 0    Left Knee Flexion 122      Strength   Overall Strength Deficits    Overall Strength Comments Deferred due to R sciatica    Strength Assessment Site Knee    Right/Left Knee Left;Right  Objective measurements completed on examination: See above findings.       [facility] Adult PT Treatment/Exercise - 05/24/21 0001       Exercises   Exercises Knee/Hip      Knee/Hip Exercises: Stretches   Other Knee/Hip Stretches Tailgate knee flexion R knee 1 minute    Other Knee/Hip Stretches Supine R knee flexion with belt assist 10X 10 seconds and quadriceps set in between sets      Knee/Hip Exercises: Machines for Strengthening   Cybex Leg Press 75# 20X slow eccentrics and full extension      Knee/Hip Exercises: Standing   Other Standing Knee Exercises Trunk extension AROM 10X 3 seconds (hips forward)    Other Standing Knee Exercises Shoulder blade pinches 10X 5 seconds                     PT Education - 05/24/21 1741     Education Details Reviewed exam findings and HEP.    Person(s) Educated Patient    Methods Explanation;Handout;Demonstration;Tactile cues;Verbal cues    Comprehension Verbal cues required;Returned demonstration;Need further instruction;Verbalized understanding;Tactile cues required              PT Short Term Goals - 05/24/21 1746       PT SHORT TERM GOAL #1   Title Improve R knee AROM to -3 to 120 degrees.    Baseline -6 to 112 degrees.    Time 4    Period Weeks    Status New    Target Date 06/21/21               PT Long Term Goals - 05/24/21 1747       PT LONG TERM GOAL #1   Title Improve FOTO to 60.    Baseline 47    Time 8    Period Weeks    Status New    Target Date 07/20/21      PT LONG TERM GOAL #2   Title Zyiah will report R knee pain consistently 0-3/10 on the Numeric Pain Rating Scale.    Baseline Sciatica 5-7/10    Time 8    Period Weeks    Status New    Target Date 07/20/21      PT LONG TERM GOAL #3   Title Improve R knee extension AROM to 0 degrees.    Baseline -6 degrees    Time 8    Period Weeks    Status New    Target Date 07/20/21      PT LONG TERM GOAL #4    Title Denetta will be able to walk safely without a walker and be able to do stairs for moving back home independently.    Baseline Walker and living with family.    Time 8    Period Weeks    Status New    Target Date 07/20/21      PT LONG TERM GOAL #5   Title Junior will be independent with her long-term HEP at [place].    Baseline Assigned today.    Time 8    Period Weeks    Status New    Target Date 07/20/21                    Plan - 05/24/21 1742     Clinical Impression Statement Rochele is moving great for < 3 weeks s/p R TKA.  Pain is more sciatica than knee although the knee is limiting  weight-bearing function and her ability to live independently.  Camdyn would like to get stronger, walk normally without the walker, calm sciatic symptoms and return home.    Personal Factors and Comorbidities Comorbidity 3+    Comorbidities HTN, R sciatica and previous L TKA.    Examination-Activity Limitations Stairs;Stand;Dressing;Bed Mobility;Bend;Sit;Transfers;Sleep;Lift;Carry;Locomotion Level;Squat    Examination-Participation Restrictions Interpersonal Relationship;Community Activity;Driving    Stability/Clinical Decision Making Stable/Uncomplicated    Clinical Decision Making Low    Rehab Potential Good    PT Frequency 2x / week    PT Duration 8 weeks    PT Treatment/Interventions ADLs/Self Care Home Management;Electrical Stimulation;Cryotherapy;Gait training;Stair training;Functional mobility training;Therapeutic activities;Neuromuscular re-education;Therapeutic exercise;Patient/family education;Manual techniques;Passive range of motion;Vasopneumatic Device    PT Next Visit Plan Review HEP, progress knee AROM, quadriceps strength, hip abductors strength and balance while avoiding increasing sciatica.  Modify activities to reduce sciatica.    PT Home Exercise Plan Access Code: [email or web address]    Consulted and Agree with Plan of Care Patient             Patient will benefit from  skilled therapeutic intervention in order to improve the following deficits and impairments:  Abnormal gait, Decreased activity tolerance, Decreased endurance, Decreased range of motion, Decreased strength, Difficulty walking, Increased edema, Impaired flexibility, Improper body mechanics, Postural dysfunction, Pain  Visit Diagnosis: Difficulty walking  Muscle weakness (generalized)  Localized edema  Stiffness of right knee, not elsewhere classified  Right knee pain, unspecified chronicity     Problem List Patient Active Problem List   Diagnosis Date Noted   Status post right knee replacement 05/04/2021   Vitamin B12 deficiency 03/14/2021   Macrocytosis without anemia 03/14/2021   Vitamin D deficiency 02/17/2020   Sleep disorder 07/10/2018   ADHD (attention deficit hyperactivity disorder) 10/01/2017   Unilateral primary osteoarthritis, right knee 08/20/2017   Chronic pain of both shoulders 08/20/2017   Complete tear of right rotator cuff 04/29/2017   Osteoarthritis of left knee 06/16/2015   Status post total left knee replacement 06/16/2015   SUBSTANCE ABUSE 02/06/2009   Attention deficit disorder 09/14/2007   COLONIC POLYPS, HX OF 08/03/2007   Essential hypertension 01/20/2007   OBESITY NOS 12/04/2006   Allergic rhinitis 12/04/2006   GASTROJEJUNOSTOMY, HX OF 12/04/2006   BREAST BIOPSY, HX OF 12/04/2006    Cherlyn Cushing, PT, MPT 05/24/2021, 5:50 PM  [facility] Physical Therapy [address] [place], [place], [address] Phone: [phone]   Fax:  [phone]  Name: REMINGTYN DEPAOLA MRN: [id number] Date of Birth: 1951-01-24

## 2021-05-24 NOTE — Patient Instructions (Signed)
Access Code: [email or web address] URL: [email or web address] Date: 05/24/2021 Prepared by: Pauletta Browns  Exercises Supine Quadricep Sets - 3-5 x daily - 7 x weekly - 2-3 sets - 10 reps - 5 second hold Hip Flexion - 1 x daily - 7 x weekly - 3 sets - 10 reps Standing Lumbar Extension at Wall - Forearms - 5 x daily - 7 x weekly - 1 sets - 5 reps - 3 seconds hold Standing Scapular Retraction - 5 x daily - 7 x weekly - 1 sets - 5 reps - 5 second hold

## 2021-05-25 ENCOUNTER — Encounter: Payer: Medicare PPO | Admitting: Physical Therapy

## 2021-05-28 ENCOUNTER — Other Ambulatory Visit: Payer: Self-pay

## 2021-05-28 ENCOUNTER — Ambulatory Visit: Payer: Medicare PPO | Admitting: Physical Therapy

## 2021-05-28 ENCOUNTER — Encounter: Payer: Self-pay | Admitting: Physical Therapy

## 2021-05-28 ENCOUNTER — Other Ambulatory Visit: Payer: Self-pay | Admitting: Orthopaedic Surgery

## 2021-05-28 DIAGNOSIS — R262 Difficulty in walking, not elsewhere classified: Secondary | ICD-10-CM | POA: Diagnosis not present

## 2021-05-28 DIAGNOSIS — M6281 Muscle weakness (generalized): Secondary | ICD-10-CM | POA: Diagnosis not present

## 2021-05-28 DIAGNOSIS — M25661 Stiffness of right knee, not elsewhere classified: Secondary | ICD-10-CM | POA: Diagnosis not present

## 2021-05-28 DIAGNOSIS — M25561 Pain in right knee: Secondary | ICD-10-CM | POA: Diagnosis not present

## 2021-05-28 DIAGNOSIS — R600 Localized edema: Secondary | ICD-10-CM

## 2021-05-28 MED ORDER — OXYCODONE HCL 5 MG PO TABS: 5.0000 mg | ORAL_TABLET | Freq: Four times a day (QID) | ORAL | 0 refills | Status: DC | PRN

## 2021-05-28 NOTE — Therapy (Signed)
[facility] Physical Therapy [address] [place], [place], [address] Phone: [phone]   Fax:  [phone]  Physical Therapy Treatment  Patient Details  Name: Leslie [name] MRN: [id number] Date of Birth: [birth year] Referring Provider (PT): Kathryne Hitch MD   Encounter Date: 05/28/2021   PT End of Session - 05/28/21 1430     Visit Number 2    Number of Visits 16    Date for PT Re-Evaluation 07/20/21    Authorization Type [employer]    Progress Note Due on Visit 10    PT Start Time 1430    PT Stop Time 1525    PT Time Calculation (min) 55 min    Activity Tolerance Patient limited by pain    Behavior During Therapy [facility] for tasks assessed/performed             Past Medical History:  Diagnosis Date   ADD (attention deficit disorder)    Allergy    Anemia    Arthritis 07/22/2014   osteoarthritis left knee   Colon polyps    Hypertension    Left knee pain    chronic   Narcotic addiction (HCC)    Obesity    post gastric bypass   Vitamin D deficiency     Past Surgical History:  Procedure Laterality Date   arthscopic knee surgery Left    several times   BREAST BIOPSY     BREAST SURGERY  years ago   breast biopsy   CHOLECYSTECTOMY     EYE SURGERY Bilateral 2013   Toric Lens implants   EYE SURGERY Right 2014   corneal amniotic membrane   GASTRIC BYPASS  2003   KIDNEY DONATION  2004   TOTAL KNEE ARTHROPLASTY Left 06/16/2015   Procedure: LEFT TOTAL KNEE ARTHROPLASTY;  Surgeon: Kathryne Hitch, MD;  Location: [facility];  Service: Orthopedics;  Laterality: Left;   TOTAL KNEE ARTHROPLASTY Right 05/04/2021   Procedure: RIGHT TOTAL KNEE ARTHROPLASTY;  Surgeon: Kathryne Hitch, MD;  Location: [facility];  Service: Orthopedics;  Laterality: Right;    There were no vitals filed for this visit.   Subjective Assessment - 05/28/21 1431     Subjective She did HEP. She is having issues with sleep. She has not slept thru night for  years. Since surgery she is up 2-3 more times    Pertinent History HTN, R sciatica, previous L TKA    Limitations Sitting;Walking;Lifting;House hold activities;Standing    How long can you sit comfortably? Stiff with sit to stand transitions and very limited due to R sciatica    How long can you stand comfortably? < 5 minutes due to R sciatica    How long can you walk comfortably? < 5 minutes with walker due to R sciatica    Patient Stated Goals Get back home and be independent    Currently in Pain? Yes    Pain Score 6    since eval lowest 1/10 and highest 7/10   Pain Location Knee    Pain Orientation Right;Medial    Pain Descriptors / Indicators Sharp;Tightness;Sore    Pain Type Acute pain;Surgical pain    Pain Onset More than a month ago    Pain Frequency Constant    Aggravating Factors  sitting too much    Pain Relieving Factors lie supine or change position    Effect of Pain on Daily Activities uses walker to stand & walk,    Multiple Pain Sites Yes  Pain Score 8    Pain Location Back    Pain Orientation Right;Lower;Posterior    Pain Descriptors / Indicators Sharp;Aching;Numbness    Pain Type Chronic pain    Pain Radiating Towards back to right foot    Pain Onset 1 to 4 weeks ago    Pain Frequency Constant    Aggravating Factors  moving    Pain Relieving Factors heat pad to back,  ibuprofen    Effect of Pain on Daily Activities limits standing                [facility] PT Assessment - 05/28/21 1430       PROM   PROM Assessment Site Knee    Right/Left Knee Right    Right Knee Flexion 105   seated                          [facility] Adult PT Treatment/Exercise - 05/28/21 1430       Knee/Hip Exercises: Aerobic   Recumbent Bike seat 4 rocking motion for 8 min hold max flex ea way 5 sec      Knee/Hip Exercises: Seated   Long Arc Quad Right;2 sets;10 reps    Long Arc Quad Weight 0 lbs.    Heel Slides Right;15 reps   10 sec hold     Knee/Hip Exercises:  Supine   Heel Slides AAROM;Right;10 reps    Heel Slides Limitations 55cm ball with strap assist    Heel Prop for Knee Extension 1 minute   5 sets during vaso     Modalities   Modalities Vasopneumatic      Vasopneumatic   Number Minutes Vasopneumatic  10 minutes    Vasopnuematic Location  Knee    Vasopneumatic Pressure Medium    Vasopneumatic Temperature  34      Manual Therapy   Manual Therapy Joint mobilization;Manual Traction    Joint Mobilization tibial internal rotation with light traction and contralateral knee extension for flexion.  femoral internal rotation with contralateral flexion.                       PT Short Term Goals - 05/24/21 1746       PT SHORT TERM GOAL #1   Title Improve R knee AROM to -3 to 120 degrees.    Baseline -6 to 112 degrees.    Time 4    Period Weeks    Status New    Target Date 06/21/21               PT Long Term Goals - 05/24/21 1747       PT LONG TERM GOAL #1   Title Improve FOTO to 60.    Baseline 47    Time 8    Period Weeks    Status New    Target Date 07/20/21      PT LONG TERM GOAL #2   Title Corisa will report R knee pain consistently 0-3/10 on the Numeric Pain Rating Scale.    Baseline Sciatica 5-7/10    Time 8    Period Weeks    Status New    Target Date 07/20/21      PT LONG TERM GOAL #3   Title Improve R knee extension AROM to 0 degrees.    Baseline -6 degrees    Time 8    Period Weeks    Status New    Target  Date 07/20/21      PT LONG TERM GOAL #4   Title Camillia will be able to walk safely without a walker and be able to do stairs for moving back home independently.    Baseline Walker and living with family.    Time 8    Period Weeks    Status New    Target Date 07/20/21      PT LONG TERM GOAL #5   Title Deborra will be independent with her long-term HEP at [place].    Baseline Assigned today.    Time 8    Period Weeks    Status New    Target Date 07/20/21                    Plan - 05/28/21 1430     Clinical Impression Statement Patient was able to tolerate manual therapy to increase flexion & extension.  She responded to reciprocating exercises for flexion & ext with less pain.    Personal Factors and Comorbidities Comorbidity 3+    Comorbidities HTN, R sciatica and previous L TKA.    Examination-Activity Limitations Stairs;Stand;Dressing;Bed Mobility;Bend;Sit;Transfers;Sleep;Lift;Carry;Locomotion Level;Squat    Examination-Participation Restrictions Interpersonal Relationship;Community Activity;Driving    Stability/Clinical Decision Making Stable/Uncomplicated    Rehab Potential Good    PT Frequency 2x / week    PT Duration 8 weeks    PT Treatment/Interventions ADLs/Self Care Home Management;Electrical Stimulation;Cryotherapy;Gait training;Stair training;Functional mobility training;Therapeutic activities;Neuromuscular re-education;Therapeutic exercise;Patient/family education;Manual techniques;Passive range of motion;Vasopneumatic Device    PT Next Visit Plan progress knee PROM, quadriceps strength, hip abductors strength and balance while avoiding increasing sciatica.  Modify activities to reduce sciatica.    PT Home Exercise Plan Access Code: [email or web address]    Consulted and Agree with Plan of Care Patient             Patient will benefit from skilled therapeutic intervention in order to improve the following deficits and impairments:  Abnormal gait, Decreased activity tolerance, Decreased endurance, Decreased range of motion, Decreased strength, Difficulty walking, Increased edema, Impaired flexibility, Improper body mechanics, Postural dysfunction, Pain  Visit Diagnosis: Difficulty walking  Muscle weakness (generalized)  Localized edema  Stiffness of right knee, not elsewhere classified  Right knee pain, unspecified chronicity     Problem List Patient Active Problem List   Diagnosis Date Noted   Status post right knee replacement  05/04/2021   Vitamin B12 deficiency 03/14/2021   Macrocytosis without anemia 03/14/2021   Vitamin D deficiency 02/17/2020   Sleep disorder 07/10/2018   ADHD (attention deficit hyperactivity disorder) 10/01/2017   Unilateral primary osteoarthritis, right knee 08/20/2017   Chronic pain of both shoulders 08/20/2017   Complete tear of right rotator cuff 04/29/2017   Osteoarthritis of left knee 06/16/2015   Status post total left knee replacement 06/16/2015   SUBSTANCE ABUSE 02/06/2009   Attention deficit disorder 09/14/2007   COLONIC POLYPS, HX OF 08/03/2007   Essential hypertension 01/20/2007   OBESITY NOS 12/04/2006   Allergic rhinitis 12/04/2006   GASTROJEJUNOSTOMY, HX OF 12/04/2006   BREAST BIOPSY, HX OF 12/04/2006    Vladimir Faster, PT, DPT 05/28/2021, 4:14 PM  [facility] Physical Therapy [address] [place], [place], [address] Phone: [phone]   Fax:  [phone]  Name: Leslie [name] MRN: [id number] Date of Birth: 12-15-1950

## 2021-05-30 ENCOUNTER — Other Ambulatory Visit: Payer: Self-pay

## 2021-05-30 ENCOUNTER — Encounter: Payer: Self-pay | Admitting: Physical Therapy

## 2021-05-30 ENCOUNTER — Ambulatory Visit (INDEPENDENT_AMBULATORY_CARE_PROVIDER_SITE_OTHER): Payer: Medicare PPO | Admitting: Physical Therapy

## 2021-05-30 DIAGNOSIS — M25561 Pain in right knee: Secondary | ICD-10-CM | POA: Diagnosis not present

## 2021-05-30 DIAGNOSIS — R600 Localized edema: Secondary | ICD-10-CM | POA: Diagnosis not present

## 2021-05-30 DIAGNOSIS — M6281 Muscle weakness (generalized): Secondary | ICD-10-CM | POA: Diagnosis not present

## 2021-05-30 DIAGNOSIS — R262 Difficulty in walking, not elsewhere classified: Secondary | ICD-10-CM | POA: Diagnosis not present

## 2021-05-30 DIAGNOSIS — M25661 Stiffness of right knee, not elsewhere classified: Secondary | ICD-10-CM | POA: Diagnosis not present

## 2021-05-30 NOTE — Therapy (Signed)
[facility] Physical Therapy [address] [place], [place], [address] Phone: [phone]   Fax:  [phone]  Physical Therapy Treatment  Patient Details  Name: Leslie [name] MRN: [id number] Date of Birth: 12/03/1950 Referring Provider (PT): Kathryne Hitch MD   Encounter Date: 05/30/2021   PT End of Session - 05/30/21 1200     Visit Number 3    Number of Visits 16    Date for PT Re-Evaluation 07/20/21    Authorization Type [employer]    Progress Note Due on Visit 10    PT Start Time 1155    PT Stop Time 1233    PT Time Calculation (min) 38 min    Activity Tolerance Patient tolerated treatment well;Patient limited by pain    Behavior During Therapy [facility] for tasks assessed/performed             Past Medical History:  Diagnosis Date   ADD (attention deficit disorder)    Allergy    Anemia    Arthritis 07/22/2014   osteoarthritis left knee   Colon polyps    Hypertension    Left knee pain    chronic   Narcotic addiction (HCC)    Obesity    post gastric bypass   Vitamin D deficiency     Past Surgical History:  Procedure Laterality Date   arthscopic knee surgery Left    several times   BREAST BIOPSY     BREAST SURGERY  years ago   breast biopsy   CHOLECYSTECTOMY     EYE SURGERY Bilateral 2013   Toric Lens implants   EYE SURGERY Right 2014   corneal amniotic membrane   GASTRIC BYPASS  2003   KIDNEY DONATION  2004   TOTAL KNEE ARTHROPLASTY Left 06/16/2015   Procedure: LEFT TOTAL KNEE ARTHROPLASTY;  Surgeon: Kathryne Hitch, MD;  Location: [facility];  Service: Orthopedics;  Laterality: Left;   TOTAL KNEE ARTHROPLASTY Right 05/04/2021   Procedure: RIGHT TOTAL KNEE ARTHROPLASTY;  Surgeon: Kathryne Hitch, MD;  Location: [facility];  Service: Orthopedics;  Laterality: Right;    There were no vitals filed for this visit.   Subjective Assessment - 05/30/21 1158     Subjective Pt. indicated pain around 5/10 or so today.   Feeling swollen and pain on medial aspect of knee.    Pertinent History HTN, R sciatica, previous L TKA    Limitations Sitting;Walking;Lifting;House hold activities;Standing    How long can you sit comfortably? Stiff with sit to stand transitions and very limited due to R sciatica    How long can you stand comfortably? < 5 minutes due to R sciatica    How long can you walk comfortably? < 5 minutes with walker due to R sciatica    Patient Stated Goals Get back home and be independent    Currently in Pain? Yes    Pain Score 5     Pain Location Knee    Pain Orientation Right;Medial    Pain Descriptors / Indicators Tightness;Sore;Sharp    Pain Type Surgical pain    Pain Onset More than a month ago    Pain Frequency Intermittent    Aggravating Factors  end range, morning time, inactivity stiffness    Pain Relieving Factors rest    Pain Onset 1 to 4 weeks ago  [facility] Adult PT Treatment/Exercise - 05/30/21 0001       Ambulation/Gait   Ambulation/Gait Yes    Ambulation/Gait Assistance 5: Supervision    Ambulation Distance (Feet) 100 Feet    Assistive device Straight cane    Ambulation Surface Level;Indoor    Gait Comments Cane use in Lt UE c verbal cues for sequencing, used while in clinic today (instructed for use at home and in clinic)      Knee/Hip Exercises: Aerobic   Recumbent Bike seat 4, partial revolutions c 5 sec hold 6 mins      Knee/Hip Exercises: Machines for Strengthening   Cybex Leg Press BLE 75# 20X slow eccentrics and full extension and RLE 31# 20 reps      Knee/Hip Exercises: Standing   Forward Step Up Both;1 set;10 reps;Hand Hold: 2;Step Height: 6"    Forward Step Up Limitations mirror, demo & verbal cues on technique    Step Down Both;1 set;10 reps;Hand Hold: 2;Step Height: 6"    Step Down Limitations mirror, demo & verbal cues on technique    Rocker Board 1 minute   ant/level/post & right/level/left, square board  w/2 Civil engineer, contracting, demo & verbal cues on technique      Manual Therapy   Joint Mobilization tibial internal rotation with light traction and contralateral knee extension for flexion.  femoral internal rotation with contralateral flexion.                       PT Short Term Goals - 05/24/21 1746       PT SHORT TERM GOAL #1   Title Improve R knee AROM to -3 to 120 degrees.    Baseline -6 to 112 degrees.    Time 4    Period Weeks    Status New    Target Date 06/21/21               PT Long Term Goals - 05/24/21 1747       PT LONG TERM GOAL #1   Title Improve FOTO to 60.    Baseline 47    Time 8    Period Weeks    Status New    Target Date 07/20/21      PT LONG TERM GOAL #2   Title [name] will report R knee pain consistently 0-3/10 on the Numeric Pain Rating Scale.    Baseline Sciatica 5-7/10    Time 8    Period Weeks    Status New    Target Date 07/20/21      PT LONG TERM GOAL #3   Title Improve R knee extension AROM to 0 degrees.    Baseline -6 degrees    Time 8    Period Weeks    Status New    Target Date 07/20/21      PT LONG TERM GOAL #4   Title [name] will be able to walk safely without a walker and be able to do stairs for moving back home independently.    Baseline Walker and living with family.    Time 8    Period Weeks    Status New    Target Date 07/20/21      PT LONG TERM GOAL #5   Title [name] will be independent with her long-term HEP at [place].    Baseline Assigned today.    Time 8    Period Weeks    Status New  Target Date 07/20/21                   Plan - 05/30/21 1237     Clinical Impression Statement PT instructed in use of cane instead of furniture walking in home.  She appears safe for use of cane at home. PT instructed in step ups & step downs for format for curbs & stairs which she improved technique with instruction & rep.    Personal Factors and Comorbidities Comorbidity 3+     Comorbidities HTN, R sciatica and previous L TKA.    Examination-Activity Limitations Stairs;Stand;Dressing;Bed Mobility;Bend;Sit;Transfers;Sleep;Lift;Carry;Locomotion Level;Squat    Examination-Participation Restrictions Interpersonal Relationship;Community Activity;Driving    Stability/Clinical Decision Making Stable/Uncomplicated    Rehab Potential Good    PT Frequency 2x / week    PT Duration 8 weeks    PT Treatment/Interventions ADLs/Self Care Home Management;Electrical Stimulation;Cryotherapy;Gait training;Stair training;Functional mobility training;Therapeutic activities;Neuromuscular re-education;Therapeutic exercise;Patient/family education;Manual techniques;Passive range of motion;Vasopneumatic Device    PT Next Visit Plan progress knee PROM with manaul therapy & ther exercise,  work on strength including functionally    PT Home Exercise Plan Access Code: [email or web address]    Consulted and Agree with Plan of Care Patient             Patient will benefit from skilled therapeutic intervention in order to improve the following deficits and impairments:  Abnormal gait, Decreased activity tolerance, Decreased endurance, Decreased range of motion, Decreased strength, Difficulty walking, Increased edema, Impaired flexibility, Improper body mechanics, Postural dysfunction, Pain  Visit Diagnosis: Difficulty walking  Muscle weakness (generalized)  Localized edema  Stiffness of right knee, not elsewhere classified  Right knee pain, unspecified chronicity     Problem List Patient Active Problem List   Diagnosis Date Noted   Status post right knee replacement 05/04/2021   Vitamin B12 deficiency 03/14/2021   Macrocytosis without anemia 03/14/2021   Vitamin D deficiency 02/17/2020   Sleep disorder 07/10/2018   ADHD (attention deficit hyperactivity disorder) 10/01/2017   Unilateral primary osteoarthritis, right knee 08/20/2017   Chronic pain of both shoulders 08/20/2017   Complete  tear of right rotator cuff 04/29/2017   Osteoarthritis of left knee 06/16/2015   Status post total left knee replacement 06/16/2015   SUBSTANCE ABUSE 02/06/2009   Attention deficit disorder 09/14/2007   COLONIC POLYPS, HX OF 08/03/2007   Essential hypertension 01/20/2007   OBESITY NOS 12/04/2006   Allergic rhinitis 12/04/2006   GASTROJEJUNOSTOMY, HX OF 12/04/2006   BREAST BIOPSY, HX OF 12/04/2006    Vladimir Faster, PT, DPT 05/30/2021, 12:44 PM  [facility] Physical Therapy [address] [place], [place], [address] Phone: [phone]   Fax:  [phone]  Name: Leslie [name] MRN: [id number] Date of Birth: April 20, 1951

## 2021-05-31 ENCOUNTER — Other Ambulatory Visit: Payer: Self-pay | Admitting: Orthopaedic Surgery

## 2021-06-01 ENCOUNTER — Other Ambulatory Visit: Payer: Self-pay | Admitting: Orthopaedic Surgery

## 2021-06-01 MED ORDER — OXYCODONE HCL 5 MG PO TABS: 5.0000 mg | ORAL_TABLET | Freq: Four times a day (QID) | ORAL | 0 refills | Status: DC | PRN

## 2021-06-04 ENCOUNTER — Other Ambulatory Visit: Payer: Self-pay

## 2021-06-04 ENCOUNTER — Ambulatory Visit
Admission: RE | Admit: 2021-06-04 | Discharge: 2021-06-04 | Disposition: A | Discharge status: Home / Self Care | Payer: Medicare PPO | Source: Ambulatory Visit | Attending: Physician Assistant | Admitting: Physician Assistant

## 2021-06-04 DIAGNOSIS — G8929 Other chronic pain: Secondary | ICD-10-CM

## 2021-06-04 DIAGNOSIS — M48061 Spinal stenosis, lumbar region without neurogenic claudication: Secondary | ICD-10-CM | POA: Diagnosis not present

## 2021-06-04 DIAGNOSIS — M545 Low back pain, unspecified: Secondary | ICD-10-CM | POA: Diagnosis not present

## 2021-06-06 ENCOUNTER — Encounter: Payer: Self-pay | Admitting: Rehabilitative and Restorative Service Providers"

## 2021-06-06 ENCOUNTER — Other Ambulatory Visit: Payer: Self-pay

## 2021-06-06 ENCOUNTER — Ambulatory Visit: Payer: Medicare PPO | Admitting: Rehabilitative and Restorative Service Providers"

## 2021-06-06 DIAGNOSIS — R600 Localized edema: Secondary | ICD-10-CM | POA: Diagnosis not present

## 2021-06-06 DIAGNOSIS — M25561 Pain in right knee: Secondary | ICD-10-CM

## 2021-06-06 DIAGNOSIS — R262 Difficulty in walking, not elsewhere classified: Secondary | ICD-10-CM | POA: Diagnosis not present

## 2021-06-06 DIAGNOSIS — M6281 Muscle weakness (generalized): Secondary | ICD-10-CM | POA: Diagnosis not present

## 2021-06-06 DIAGNOSIS — M25661 Stiffness of right knee, not elsewhere classified: Secondary | ICD-10-CM

## 2021-06-06 NOTE — Patient Instructions (Signed)
Access Code: [email or web address] URL: [email or web address] Date: 06/06/2021 Prepared by: Pauletta Browns  Exercises Supine Quadricep Sets - 3-5 x daily - 7 x weekly - 2-3 sets - 10 reps - 5 second hold Hip Flexion - 1 x daily - 7 x weekly - 3 sets - 10 reps Standing Lumbar Extension at Wall - Forearms - 5 x daily - 7 x weekly - 1 sets - 5 reps - 3 seconds hold Standing Scapular Retraction - 5 x daily - 7 x weekly - 1 sets - 5 reps - 5 second hold Standing Hip Hiking - 2-3 x daily - 7 x weekly - 1 sets - 10 reps - 3 seconds hold

## 2021-06-06 NOTE — Therapy (Signed)
[facility] Physical Therapy [address] [place], [place], [address] Phone: [phone]   Fax:  [phone]  Physical Therapy Treatment  Patient Details  Name: Leslie [name] MRN: [id number] Date of Birth: May 23, 1951 Referring Provider (PT): Mcarthur Rossetti MD   Encounter Date: 06/06/2021   PT End of Session - 06/06/21 1716     Visit Number 4    Number of Visits 16    Date for PT Re-Evaluation 07/20/21    Authorization Type [employer]    Progress Note Due on Visit 10    PT Start Time 1610    PT Stop Time 1705    PT Time Calculation (min) 55 min    Activity Tolerance Patient tolerated treatment well    Behavior During Therapy [facility] for tasks assessed/performed             Past Medical History:  Diagnosis Date   ADD (attention deficit disorder)    Allergy    Anemia    Arthritis 07/22/2014   osteoarthritis left knee   Colon polyps    Hypertension    Left knee pain    chronic   Narcotic addiction ([place])    Obesity    post gastric bypass   Vitamin D deficiency     Past Surgical History:  Procedure Laterality Date   arthscopic knee surgery Left    several times   BREAST BIOPSY     BREAST SURGERY  years ago   breast biopsy   CHOLECYSTECTOMY     EYE SURGERY Bilateral 2013   Toric Lens implants   EYE SURGERY Right 2014   corneal amniotic membrane   GASTRIC BYPASS  2003   KIDNEY DONATION  2004   TOTAL KNEE ARTHROPLASTY Left 06/16/2015   Procedure: LEFT TOTAL KNEE ARTHROPLASTY;  Surgeon: Mcarthur Rossetti, MD;  Location: [facility];  Service: Orthopedics;  Laterality: Left;   TOTAL KNEE ARTHROPLASTY Right 05/04/2021   Procedure: RIGHT TOTAL KNEE ARTHROPLASTY;  Surgeon: Mcarthur Rossetti, MD;  Location: [facility];  Service: Orthopedics;  Laterality: Right;    There were no vitals filed for this visit.   Subjective Assessment - 06/06/21 1703     Subjective Leslie [name] notes R sciatica is most functionally limiting.  She also  doesn't trust her R knee due to instability.  She is very motivated to return to her home and be independent.    Pertinent History HTN, R sciatica, previous L TKA    Limitations Sitting;Walking;Lifting;House hold activities;Standing    How long can you sit comfortably? Stiff with sit to stand transitions and very limited due to R sciatica    How long can you stand comfortably? < 5 minutes due to R sciatica    How long can you walk comfortably? < 5 minutes with walker due to R sciatica    Patient Stated Goals Get back home and be independent    Currently in Pain? Yes    Pain Score 6     Pain Location Leg    Pain Orientation Right    Pain Descriptors / Indicators Throbbing;Aching    Pain Type Chronic pain    Pain Radiating Towards R foot    Pain Onset More than a month ago    Pain Frequency Intermittent    Aggravating Factors  Prolonged postures, weight-bearing and sit to stand transfers    Pain Relieving Factors Ice and pain medication    Effect of Pain on Daily Activities Needs walker for ambulation and has  limited WB endurance.  Is living with her daughter due to lack of independence.    Pain Onset 1 to 4 weeks ago                [facility] PT Assessment - 06/06/21 0001       ROM / Strength   AROM / PROM / Strength AROM      AROM   Overall AROM  Deficits    AROM Assessment Site Knee    Right/Left Knee Right    Right Knee Extension -5    Right Knee Flexion 120                           [facility] Adult PT Treatment/Exercise - 06/06/21 0001       Therapeutic Activites    Therapeutic Activities Other Therapeutic Activities    Other Therapeutic Activities Hip hiking for postural correction, sciatica reduction and improved gait quality/endurance      Exercises   Exercises Knee/Hip      Knee/Hip Exercises: Stretches   Other Knee/Hip Stretches Tailgate knee flexion R knee 1 minute    Other Knee/Hip Stretches Supine R knee flexion with belt assist 10X 10 seconds  and quadriceps set in between sets      Knee/Hip Exercises: Aerobic   Recumbent Bike Seat 6 (extension emphasis) and Seat 5 flexion emphasis full range 4 minutes each Level 3      Knee/Hip Exercises: Machines for Strengthening   Cybex Leg Press 75# 15X slow eccentrics and full extension double leg and 37# 10X slow eccentrics and full extension single leg      Knee/Hip Exercises: Standing   Other Standing Knee Exercises Trunk extension AROM 10X 3 seconds (hips forward)    Other Standing Knee Exercises Shoulder blade pinches 10X 5 seconds      Modalities   Modalities Vasopneumatic      Vasopneumatic   Number Minutes Vasopneumatic  10 minutes    Vasopnuematic Location  Knee    Vasopneumatic Pressure Medium    Vasopneumatic Temperature  34                     PT Education - 06/06/21 1714     Education Details Reviewed emphasis on quadriceps sets (100/Day), knee flexion (50/day) and hip hike (20-30/Day) with HEP.    Person(s) Educated Patient    Methods Explanation;Demonstration;Tactile cues;Verbal cues;Handout    Comprehension Verbalized understanding;Tactile cues required;Need further instruction;Returned demonstration;Verbal cues required              PT Short Term Goals - 06/06/21 1715       PT SHORT TERM GOAL #1   Title Improve R knee AROM to -3 to 120 degrees.    Baseline -5 to 120 degrees (was -6 to 112 degrees).    Time 4    Period Weeks    Status Partially Met    Target Date 06/21/21               PT Long Term Goals - 05/24/21 1747       PT LONG TERM GOAL #1   Title Improve FOTO to 60.    Baseline 47    Time 8    Period Weeks    Status New    Target Date 07/20/21      PT LONG TERM GOAL #2   Title Leslie [name] will report R knee pain consistently 0-3/10 on the Numeric  Pain Rating Scale.    Baseline Sciatica 5-7/10    Time 8    Period Weeks    Status New    Target Date 07/20/21      PT LONG TERM GOAL #3   Title Improve R knee extension  AROM to 0 degrees.    Baseline -6 degrees    Time 8    Period Weeks    Status New    Target Date 07/20/21      PT LONG TERM GOAL #4   Title Leslie [name] will be able to walk safely without a walker and be able to do stairs for moving back home independently.    Baseline Walker and living with family.    Time 8    Period Weeks    Status New    Target Date 07/20/21      PT LONG TERM GOAL #5   Title Leslie [name] will be independent with her long-term HEP at [place].    Baseline Assigned today.    Time 8    Period Weeks    Status New    Target Date 07/20/21                   Plan - 06/06/21 1716     Clinical Impression Statement Leslie [name] is making objective progress towards long-term goals.  Edema is slowly improving as is objective AROM.  R sciatica is most functionally limiting and we are addressing this with postural correction and hip abductors strengthening along with her knee specific activities.  Her prognosis remains good with the recommended course of supervised PT.    Personal Factors and Comorbidities Comorbidity 3+    Comorbidities HTN, R sciatica and previous L TKA.    Examination-Activity Limitations Stairs;Stand;Dressing;Bed Mobility;Bend;Sit;Transfers;Sleep;Lift;Carry;Locomotion Level;Squat    Examination-Participation Restrictions Interpersonal Relationship;Community Activity;Driving    Stability/Clinical Decision Making Stable/Uncomplicated    Rehab Potential Good    PT Frequency 2x / week    PT Duration 8 weeks    PT Treatment/Interventions ADLs/Self Care Home Management;Electrical Stimulation;Cryotherapy;Gait training;Stair training;Functional mobility training;Therapeutic activities;Neuromuscular re-education;Therapeutic exercise;Patient/family education;Manual techniques;Passive range of motion;Vasopneumatic Device    PT Next Visit Plan Knee extension AROM, edema control, quadriceps and hip abductors strength, interventions to strengthen her back and reduce sciatica     PT Home Exercise Plan Access Code: [email or web address]    Consulted and Agree with Plan of Care Patient             Patient will benefit from skilled therapeutic intervention in order to improve the following deficits and impairments:  Abnormal gait, Decreased activity tolerance, Decreased endurance, Decreased range of motion, Decreased strength, Difficulty walking, Increased edema, Impaired flexibility, Improper body mechanics, Postural dysfunction, Pain  Visit Diagnosis: Difficulty walking  Muscle weakness (generalized)  Stiffness of right knee, not elsewhere classified  Right knee pain, unspecified chronicity  Localized edema     Problem List Patient Active Problem List   Diagnosis Date Noted   Status post right knee replacement 05/04/2021   Vitamin B12 deficiency 03/14/2021   Macrocytosis without anemia 03/14/2021   Vitamin D deficiency 02/17/2020   Sleep disorder 07/10/2018   ADHD (attention deficit hyperactivity disorder) 10/01/2017   Unilateral primary osteoarthritis, right knee 08/20/2017   Chronic pain of both shoulders 08/20/2017   Complete tear of right rotator cuff 04/29/2017   Osteoarthritis of left knee 06/16/2015   Status post total left knee replacement 06/16/2015   SUBSTANCE ABUSE 02/06/2009   Attention deficit disorder 09/14/2007   COLONIC POLYPS,  HX OF 08/03/2007   Essential hypertension 01/20/2007   OBESITY NOS 12/04/2006   Allergic rhinitis 12/04/2006   GASTROJEJUNOSTOMY, HX OF 12/04/2006   BREAST BIOPSY, HX OF 12/04/2006    Farley Ly, PT, MPT 06/06/2021, 5:21 PM  [facility] Physical Therapy [address] [place], [place], [address] Phone: [phone]   Fax:  [phone]  Name: Leslie [name] MRN: [id number] Date of Birth: 1951-01-31

## 2021-06-07 ENCOUNTER — Other Ambulatory Visit: Payer: Self-pay | Admitting: Orthopaedic Surgery

## 2021-06-07 MED ORDER — OXYCODONE HCL 5 MG PO TABS: 5.0000 mg | ORAL_TABLET | Freq: Four times a day (QID) | ORAL | 0 refills | Status: DC | PRN

## 2021-06-08 ENCOUNTER — Telehealth: Payer: Self-pay | Admitting: *Deleted

## 2021-06-08 ENCOUNTER — Encounter: Payer: Medicare PPO | Admitting: Rehabilitative and Restorative Service Providers"

## 2021-06-08 NOTE — Telephone Encounter (Signed)
Attempted Ortho bundle 30 day call to patient; no answer, but left VM. Will attempt to speak with patient during therapy next week.

## 2021-06-11 ENCOUNTER — Other Ambulatory Visit: Payer: Self-pay

## 2021-06-11 ENCOUNTER — Telehealth: Payer: Self-pay | Admitting: *Deleted

## 2021-06-11 ENCOUNTER — Encounter: Payer: Self-pay | Admitting: Rehabilitative and Restorative Service Providers"

## 2021-06-11 ENCOUNTER — Ambulatory Visit: Payer: Medicare PPO | Admitting: Rehabilitative and Restorative Service Providers"

## 2021-06-11 DIAGNOSIS — R262 Difficulty in walking, not elsewhere classified: Secondary | ICD-10-CM

## 2021-06-11 DIAGNOSIS — R600 Localized edema: Secondary | ICD-10-CM | POA: Diagnosis not present

## 2021-06-11 DIAGNOSIS — M6281 Muscle weakness (generalized): Secondary | ICD-10-CM | POA: Diagnosis not present

## 2021-06-11 DIAGNOSIS — M25661 Stiffness of right knee, not elsewhere classified: Secondary | ICD-10-CM | POA: Diagnosis not present

## 2021-06-11 DIAGNOSIS — M25561 Pain in right knee: Secondary | ICD-10-CM | POA: Diagnosis not present

## 2021-06-11 NOTE — Telephone Encounter (Signed)
Ortho bundle 30 day call and survey completed. ?

## 2021-06-11 NOTE — Therapy (Addendum)
[facility] [address] [place], [place], [address] Phone: [phone]   Fax:  [phone]  Physical Therapy Treatment/Progress Note  Patient Details  Name: RAPHAELLA LARKIN MRN: [id number] Date of Birth: 09/11/1950 Referring Provider (PT): Mcarthur Rossetti MD  Progress Note Reporting Period 05/24/2021 to 06/11/2021  See note below for Objective Data and Assessment of Progress/Goals.     Encounter Date: 06/11/2021   PT End of Session - 06/11/21 1456     Visit Number 5    Number of Visits 16    Date for PT Re-Evaluation 07/20/21    Authorization Type [employer]    Progress Note Due on Visit 10    PT Start Time 1346    PT Stop Time 1440    PT Time Calculation (min) 54 min    Activity Tolerance Patient tolerated treatment well    Behavior During Therapy WFL for tasks assessed/performed             Past Medical History:  Diagnosis Date   ADD (attention deficit disorder)    Allergy    Anemia    Arthritis 07/22/2014   osteoarthritis left knee   Colon polyps    Hypertension    Left knee pain    chronic   Narcotic addiction ([place])    Obesity    post gastric bypass   Vitamin D deficiency     Past Surgical History:  Procedure Laterality Date   arthscopic knee surgery Left    several times   BREAST BIOPSY     BREAST SURGERY  years ago   breast biopsy   CHOLECYSTECTOMY     EYE SURGERY Bilateral 2013   Toric Lens implants   EYE SURGERY Right 2014   corneal amniotic membrane   GASTRIC BYPASS  2003   KIDNEY DONATION  2004   TOTAL KNEE ARTHROPLASTY Left 06/16/2015   Procedure: LEFT TOTAL KNEE ARTHROPLASTY;  Surgeon: Mcarthur Rossetti, MD;  Location: [facility];  Service: Orthopedics;  Laterality: Left;   TOTAL KNEE ARTHROPLASTY Right 05/04/2021   Procedure: RIGHT TOTAL KNEE ARTHROPLASTY;  Surgeon: Mcarthur Rossetti, MD;  Location: [facility];  Service: Orthopedics;  Laterality: Right;    There were no vitals filed  for this visit.   Subjective Assessment - 06/11/21 1436     Subjective Rosabella notes R sciatica remains functionally limiting.  She notes thigh "shaking" when fatigued although her knee has not given out.  She is very motivated to return to her home and be independent.  HEP compliance appears good over the past week.    Pertinent History HTN, R sciatica, previous L TKA    Limitations Sitting;Walking;Lifting;House hold activities;Standing    How long can you sit comfortably? Stiff with sit to stand transitions and very limited due to R sciatica    How long can you stand comfortably? 10 minutes due to R sciatica and fatigue    How long can you walk comfortably? 10-15 minutes with walker due to R sciatica and fatigue    Patient Stated Goals Get back home and be independent    Currently in Pain? Yes    Pain Score 4     Pain Location Knee    Pain Orientation Right;Medial    Pain Descriptors / Indicators Aching;Tightness    Pain Type Chronic pain    Pain Radiating Towards R foot    Pain Onset More than a month ago    Pain Frequency Intermittent    Aggravating  Factors  Sleeping, prolonged postures (sit, stand, walk)    Pain Relieving Factors Ice and tylenol    Effect of Pain on Daily Activities Uses walker for ambulation.  Limited sleep.  Living with her daughter due to lack of independence.    Pain Onset 1 to 4 weeks ago                [facility] PT Assessment - 06/11/21 0001       ROM / Strength   AROM / PROM / Strength AROM      AROM   Overall AROM  Deficits    AROM Assessment Site Knee    Right/Left Knee Right    Right Knee Extension -3    Right Knee Flexion 120                           [facility] Adult PT Treatment/Exercise - 06/11/21 0001       Therapeutic Activites    Other Therapeutic Activities Hip hiking for postural correction, sciatica reduction and improved gait quality/endurance      Exercises   Exercises Knee/Hip      Knee/Hip Exercises:  Stretches   Other Knee/Hip Stretches Tailgate knee flexion R knee 1 minute    Other Knee/Hip Stretches Supine R knee flexion with belt assist 10X 10 seconds and quadriceps set in between sets      Knee/Hip Exercises: Machines for Strengthening   Cybex Leg Press 75# 15X slow eccentrics and full extension double leg and 37# 10X slow eccentrics and full extension single leg      Knee/Hip Exercises: Standing   Other Standing Knee Exercises Trunk extension AROM 10X 3 seconds (hips forward)    Other Standing Knee Exercises Shoulder blade pinches 10X 5 seconds      Modalities   Modalities Vasopneumatic      Vasopneumatic   Number Minutes Vasopneumatic  10 minutes    Vasopnuematic Location  Knee    Vasopneumatic Pressure Medium    Vasopneumatic Temperature  34                     PT Education - 06/11/21 1454     Education Details See patient instructions.  Continue quadriceps strengthening that doesn't flare-up sciatica.    Person(s) Educated Patient    Methods Explanation;Demonstration;Tactile cues;Verbal cues    Comprehension Verbalized understanding;Tactile cues required;Need further instruction;Returned demonstration;Verbal cues required              PT Short Term Goals - 06/11/21 1455       PT SHORT TERM GOAL #1   Title Improve R knee AROM to -3 to 120 degrees.    Baseline -3 to 120 degrees (was -6 to 112 degrees).    Time 4    Period Weeks    Status Achieved    Target Date 06/21/21               PT Long Term Goals - 06/11/21 1455       PT LONG TERM GOAL #1   Title Improve FOTO to 60.    Baseline 47    Time 8    Period Weeks    Status On-going    Target Date 07/20/21      PT LONG TERM GOAL #2   Title Shenique will report R knee pain consistently 0-3/10 on the Numeric Pain Rating Scale.    Baseline Sciatica 5-7/10  Time 8    Period Weeks    Status On-going    Target Date 07/20/21      PT LONG TERM GOAL #3   Title Improve R knee  extension AROM to 0 degrees.    Baseline -3 (was -6) degrees    Time 8    Period Weeks    Status On-going    Target Date 07/20/21      PT LONG TERM GOAL #4   Title Arielis will be able to walk safely without a walker and be able to do stairs for moving back home independently.    Baseline Walker and living with family.    Time 8    Period Weeks    Status On-going    Target Date 07/20/21      PT LONG TERM GOAL #5   Title Pailyn will be independent with her long-term HEP at [place].    Baseline Assigned today.    Time 8    Period Weeks    Status On-going    Target Date 07/20/21                   Plan - 06/11/21 1457     Clinical Impression Statement Genise is making objective progres with her knee AROM.  She has met her December 1st AROM goal.  R sciatica has limited her participation in some physical therapy activities.  The current focus of her PT is managing edema, strengthening her quadriceps and hip abductors along with select lumbar exercises to try to reduce sciatica so Porche can more fully participate in her post-TKA rehabilitation.  Despite pain being high due to sciatica, she is progressing.    Personal Factors and Comorbidities Comorbidity 3+    Comorbidities HTN, R sciatica and previous L TKA.    Examination-Activity Limitations Stairs;Stand;Dressing;Bed Mobility;Bend;Sit;Transfers;Sleep;Lift;Carry;Locomotion Level;Squat    Examination-Participation Restrictions Interpersonal Relationship;Community Activity;Driving    Stability/Clinical Decision Making Stable/Uncomplicated    Rehab Potential Good    PT Frequency 2x / week    PT Duration 6 weeks    PT Treatment/Interventions ADLs/Self Care Home Management;Electrical Stimulation;Cryotherapy;Gait training;Stair training;Functional mobility training;Therapeutic activities;Neuromuscular re-education;Therapeutic exercise;Patient/family education;Manual techniques;Passive range of motion;Vasopneumatic Device    PT Next  Visit Plan Knee extension AROM, edema control, quadriceps and hip abductors strength, interventions to strengthen her back and reduce sciatica    PT Home Exercise Plan Access Code: [email or web address]    Consulted and Agree with Plan of Care Patient             Patient will benefit from skilled therapeutic intervention in order to improve the following deficits and impairments:  Abnormal gait, Decreased activity tolerance, Decreased endurance, Decreased range of motion, Decreased strength, Difficulty walking, Increased edema, Impaired flexibility, Improper body mechanics, Postural dysfunction, Pain  Visit Diagnosis: Difficulty walking - Plan: PT plan of care cert/re-cert  Muscle weakness (generalized) - Plan: PT plan of care cert/re-cert  Stiffness of right knee, not elsewhere classified - Plan: PT plan of care cert/re-cert  Right knee pain, unspecified chronicity - Plan: PT plan of care cert/re-cert  Localized edema - Plan: PT plan of care cert/re-cert     Problem List Patient Active Problem List   Diagnosis Date Noted   Status post right knee replacement 05/04/2021   Vitamin B12 deficiency 03/14/2021   Macrocytosis without anemia 03/14/2021   Vitamin D deficiency 02/17/2020   Sleep disorder 07/10/2018   ADHD (attention deficit hyperactivity disorder) 10/01/2017   Unilateral primary osteoarthritis, right knee 08/20/2017  Chronic pain of both shoulders 08/20/2017   Complete tear of right rotator cuff 04/29/2017   Osteoarthritis of left knee 06/16/2015   Status post total left knee replacement 06/16/2015   SUBSTANCE ABUSE 02/06/2009   Attention deficit disorder 09/14/2007   COLONIC POLYPS, HX OF 08/03/2007   Essential hypertension 01/20/2007   OBESITY NOS 12/04/2006   Allergic rhinitis 12/04/2006   GASTROJEJUNOSTOMY, HX OF 12/04/2006   BREAST BIOPSY, HX OF 12/04/2006    Farley Ly, PT, MPT 06/11/2021, 3:07 PM  [facility] [address] [place], [place], [address] Phone: [phone]   Fax:  [phone]  Name: JERALD VILLALONA MRN: [id number] Date of Birth: 1950/11/25

## 2021-06-11 NOTE — Patient Instructions (Signed)
Heavy emphasis on quadriceps sets (knee extension AROM, edema control and quadriceps strength) and knee flexion AROM due to sciatica.  Also talked about select lumbar exercises (standing trunk extension, scapular retraction and alternating hip hikes with perfect posture) to help with sciatica.

## 2021-06-13 ENCOUNTER — Other Ambulatory Visit: Payer: Self-pay

## 2021-06-13 ENCOUNTER — Ambulatory Visit (INDEPENDENT_AMBULATORY_CARE_PROVIDER_SITE_OTHER): Payer: Medicare PPO | Admitting: Orthopaedic Surgery

## 2021-06-13 ENCOUNTER — Encounter: Payer: Medicare PPO | Admitting: Rehabilitative and Restorative Service Providers"

## 2021-06-13 ENCOUNTER — Ambulatory Visit: Payer: Medicare PPO | Admitting: Physical Therapy

## 2021-06-13 DIAGNOSIS — R262 Difficulty in walking, not elsewhere classified: Secondary | ICD-10-CM | POA: Diagnosis not present

## 2021-06-13 DIAGNOSIS — M6281 Muscle weakness (generalized): Secondary | ICD-10-CM

## 2021-06-13 DIAGNOSIS — M25661 Stiffness of right knee, not elsewhere classified: Secondary | ICD-10-CM

## 2021-06-13 DIAGNOSIS — M25561 Pain in right knee: Secondary | ICD-10-CM

## 2021-06-13 DIAGNOSIS — R600 Localized edema: Secondary | ICD-10-CM | POA: Diagnosis not present

## 2021-06-13 DIAGNOSIS — Z96651 Presence of right artificial knee joint: Secondary | ICD-10-CM

## 2021-06-13 DIAGNOSIS — M48062 Spinal stenosis, lumbar region with neurogenic claudication: Secondary | ICD-10-CM

## 2021-06-13 MED ORDER — HYDROCODONE-ACETAMINOPHEN 5-325 MG PO TABS: 1.0000 | ORAL_TABLET | Freq: Four times a day (QID) | ORAL | 0 refills | Status: DC | PRN

## 2021-06-13 NOTE — Progress Notes (Signed)
HPI: [name] returns today for follow-up of her low back pain with radicular symptoms down the right leg.  She continues to have pain down the right leg that radiates into her right foot.  She states at times her right foot goes numb.  She has no radicular symptoms on the left.  Symptoms are worse with prolonged standing.  She is also status post right total knee arthroplasty 05/04/2021.  States the right leg overall in regards to the knee is doing well.  She feels that she still ambulating with a walker due to the radicular symptoms she is having down the right leg from her back.  MRI lumbar spine dated 06/04/2021 shows severe spinal stenosis at L4-5 and also severe bilateral facet arthropathy at this level.  L3-4 and L2-3 moderate spinal stenosis.  No foraminal stenosis throughout the lumbar spine.  Images reviewed with the patient and lumbar spine model was shown for further demonstration of the anatomy.  Review of systems see HPI otherwise negative or noncontributory.  Physical exam: Right knee she has full extension flexion to proximal 110 degrees.  No gross instability valgus varus stressing.  Surgical incisions well-healed.  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 Alvester Morin.  Did refill her pain medication changing it to Norco today she is to use this sparingly.  She understands we will need to wean her off of narcotics over the next few weeks.  Questions were encouraged and answered at length.  Follow-up as scheduled for her right total knee arthroplasty.

## 2021-06-13 NOTE — Addendum Note (Signed)
Addended by: Barbette Or on: 06/13/2021 04:39 PM   Modules accepted: Orders

## 2021-06-13 NOTE — Therapy (Signed)
[facility] [address] [place], [place], [address] Phone: [phone]   Fax:  [phone]  Physical Therapy Treatment  Patient Details  Name: Leslie [name] MRN: [id number] Date of Birth: [birth year] Referring Provider (PT): Leslie Poisson MD   Encounter Date: 06/13/2021   PT End of Session - 06/13/21 1049     Visit Number 6    Number of Visits 16    Date for PT Re-Evaluation 07/20/21    Authorization Type [employer]    Progress Note Due on Visit 10    PT Start Time 0930    PT Stop Time 1010    PT Time Calculation (min) 40 min    Activity Tolerance Patient tolerated treatment well    Behavior During Therapy [facility] for tasks assessed/performed             Past Medical History:  Diagnosis Date   ADD (attention deficit disorder)    Allergy    Anemia    Arthritis 07/22/2014   osteoarthritis left knee   Colon polyps    Hypertension    Left knee pain    chronic   Narcotic addiction ([facility])    Obesity    post gastric bypass   Vitamin D deficiency     Past Surgical History:  Procedure Laterality Date   arthscopic knee surgery Left    several times   BREAST BIOPSY     BREAST SURGERY  years ago   breast biopsy   CHOLECYSTECTOMY     EYE SURGERY Bilateral 2013   Toric Lens implants   EYE SURGERY Right 2014   corneal amniotic membrane   GASTRIC BYPASS  2003   KIDNEY DONATION  2004   TOTAL KNEE ARTHROPLASTY Left 06/16/2015   Procedure: LEFT TOTAL KNEE ARTHROPLASTY;  Surgeon: Leslie Hitch, MD;  Location: [facility];  Service: Orthopedics;  Laterality: Left;   TOTAL KNEE ARTHROPLASTY Right 05/04/2021   Procedure: RIGHT TOTAL KNEE ARTHROPLASTY;  Surgeon: Leslie Hitch, MD;  Location: [facility];  Service: Orthopedics;  Laterality: Right;    There were no vitals filed for this visit.   Subjective Assessment - 06/13/21 1017     Subjective Pt arriving to therapy reporting 3/10 pain in her right knee and 7/10  sciatic pain down left hip and posterior thigh.    Pertinent History HTN, R sciatica, previous L TKA    How long can you sit comfortably? Stiff with sit to stand transitions and very limited due to R sciatica    How long can you stand comfortably? 10 minutes due to R sciatica and fatigue    How long can you walk comfortably? 10-15 minutes with walker due to R sciatica and fatigue    Patient Stated Goals Get back home and be independent    Currently in Pain? Yes    Pain Score 3     Pain Location Knee    Pain Orientation Right    Pain Descriptors / Indicators Aching;Sore    Pain Type Surgical pain    Pain Onset More than a month ago    Pain Score 7    Pain Location Back   glutes, posterior right thigh   Pain Orientation Right;Lower    Pain Descriptors / Indicators Burning    Pain Type Chronic pain    Pain Onset More than a month ago                [facility] PT Assessment - 06/13/21 0001  Assessment   Medical Diagnosis s/p R TKA    Referring Provider (PT) Leslie Poisson MD    Onset Date/Surgical Date 05/04/21      ROM / Strength   AROM / PROM / Strength AROM      AROM   Overall AROM  Deficits    AROM Assessment Site Knee    Right/Left Knee Right    Right Knee Extension -3    Right Knee Flexion 120                           [facility] Adult PT Treatment/Exercise - 06/13/21 0001       Therapeutic Activites    Other Therapeutic Activities Hip hiking for postural correction, sciatica reduction and improved gait quality/endurance      Neuro Re-ed    Neuro Re-ed Details  Single leg stance with intermittent UE support, best time was 2 seconds on either LE   Close supervision was provided     Exercises   Exercises Knee/Hip      Knee/Hip Exercises: Aerobic   Recumbent Bike seat 5 x 6 minutes L 3      Knee/Hip Exercises: Machines for Strengthening   Cybex Leg Press 87# 3x10, 37      Knee/Hip Exercises: Standing   Forward Step Up Right;15  reps;Hand Hold: 1    Forward Step Up Limitations unable to step up on 6 inch step, step was lowered to 4    Step Down Right;Hand Hold: 1;10 reps    Other Standing Knee Exercises hip hiking inside walker x 10      Knee/Hip Exercises: Seated   Long Arc Quad Strengthening;Right;15 reps;Weights    Long Arc Quad Weight 3 lbs.    Sit to Sand without UE support;10 reps   from low mat table     Knee/Hip Exercises: Supine   Other Supine Knee/Hip Exercises single knee to chest    Other Supine Knee/Hip Exercises sciatic nerve flossing x 10                       PT Short Term Goals - 06/13/21 1029       PT SHORT TERM GOAL #1   Title Improve R knee AROM to -3 to 120 degrees.    Status Achieved               PT Long Term Goals - 06/13/21 1035       PT LONG TERM GOAL #1   Title Improve FOTO to 60.    Status On-going      PT LONG TERM GOAL #2   Title Leslie [name] will report R knee pain consistently 0-3/10 on the Numeric Pain Rating Scale.    Status On-going      PT LONG TERM GOAL #3   Title Improve R knee extension AROM to 0 degrees.    Status On-going      PT LONG TERM GOAL #4   Title Leslie [name] will be able to walk safely without a walker and be able to do stairs for moving back home independently.    Status On-going      PT LONG TERM GOAL #5   Title Leslie [name] will be independent with her long-term HEP at [place].    Status On-going                   Plan - 06/13/21 1019     Clinical  Impression Statement Pt tolerating all exercises well. Pt with good response to sciatic nerve flossing in supine. Pt still having difficulty with stepping onto a 6 inch step. Pt did much better with 4 inch with single UE support. Pt still needs to progress her rigth quad strength for improved funcitonal mobility as well as dynamic balance. I feel pt is limited by her sciatic pain in her R low back and LE. Pt stating she saw Leslie [name] today and had a good report on her right knee and  he is recommending an injection by Leslie [name]. Continue skilled PT to maximze pt's function.    Personal Factors and Comorbidities Comorbidity 3+    Comorbidities HTN, R sciatica and previous L TKA.    Examination-Activity Limitations Stairs;Stand;Dressing;Bed Mobility;Bend;Sit;Transfers;Sleep;Lift;Carry;Locomotion Level;Squat    Examination-Participation Restrictions Interpersonal Relationship;Community Activity;Driving    Stability/Clinical Decision Making Stable/Uncomplicated    Rehab Potential Good    PT Frequency 2x / week    PT Duration 6 weeks    PT Treatment/Interventions ADLs/Self Care Home Management;Electrical Stimulation;Cryotherapy;Gait training;Stair training;Functional mobility training;Therapeutic activities;Neuromuscular re-education;Therapeutic exercise;Patient/family education;Manual techniques;Passive range of motion;Vasopneumatic Device    PT Next Visit Plan Knee extension AROM, edema control, quadriceps and hip abductors strength, interventions to strengthen her back and reduce sciatica    PT Home Exercise Plan Access Code: [email or web address]    Consulted and Agree with Plan of Care Patient             Patient will benefit from skilled therapeutic intervention in order to improve the following deficits and impairments:  Abnormal gait, Decreased activity tolerance, Decreased endurance, Decreased range of motion, Decreased strength, Difficulty walking, Increased edema, Impaired flexibility, Improper body mechanics, Postural dysfunction, Pain  Visit Diagnosis: Difficulty walking  Muscle weakness (generalized)  Stiffness of right knee, not elsewhere classified  Right knee pain, unspecified chronicity  Localized edema     Problem List Patient Active Problem List   Diagnosis Date Noted   Status post right knee replacement 05/04/2021   Vitamin B12 deficiency 03/14/2021   Macrocytosis without anemia 03/14/2021   Vitamin D deficiency 02/17/2020   Sleep disorder  07/10/2018   ADHD (attention deficit hyperactivity disorder) 10/01/2017   Unilateral primary osteoarthritis, right knee 08/20/2017   Chronic pain of both shoulders 08/20/2017   Complete tear of right rotator cuff 04/29/2017   Osteoarthritis of left knee 06/16/2015   Status post total left knee replacement 06/16/2015   Attention deficit disorder 09/14/2007   COLONIC POLYPS, HX OF 08/03/2007   Essential hypertension 01/20/2007   OBESITY NOS 12/04/2006   Allergic rhinitis 12/04/2006   GASTROJEJUNOSTOMY, HX OF 12/04/2006   BREAST BIOPSY, HX OF 12/04/2006    Sharmon Leyden, PT, MPT 06/13/2021, 10:50 AM  [facility] [address] [place], [place], [address] Phone: [phone]   Fax:  [phone]  Name: Leslie [name] MRN: [id number] Date of Birth: 1951-01-18

## 2021-06-16 ENCOUNTER — Other Ambulatory Visit: Payer: Self-pay | Admitting: Internal Medicine

## 2021-06-16 DIAGNOSIS — F900 Attention-deficit hyperactivity disorder, predominantly inattentive type: Secondary | ICD-10-CM

## 2021-06-18 ENCOUNTER — Other Ambulatory Visit: Payer: Self-pay | Admitting: Internal Medicine

## 2021-06-18 DIAGNOSIS — E559 Vitamin D deficiency, unspecified: Secondary | ICD-10-CM

## 2021-06-19 ENCOUNTER — Other Ambulatory Visit: Payer: Self-pay | Admitting: Orthopaedic Surgery

## 2021-06-19 MED ORDER — HYDROCODONE-ACETAMINOPHEN 5-325 MG PO TABS: 1.0000 | ORAL_TABLET | Freq: Four times a day (QID) | ORAL | 0 refills | Status: DC | PRN

## 2021-06-20 ENCOUNTER — Encounter: Payer: Self-pay | Admitting: Rehabilitative and Restorative Service Providers"

## 2021-06-20 ENCOUNTER — Ambulatory Visit: Payer: Medicare PPO | Admitting: Rehabilitative and Restorative Service Providers"

## 2021-06-20 ENCOUNTER — Other Ambulatory Visit: Payer: Self-pay

## 2021-06-20 DIAGNOSIS — M25661 Stiffness of right knee, not elsewhere classified: Secondary | ICD-10-CM | POA: Diagnosis not present

## 2021-06-20 DIAGNOSIS — M6281 Muscle weakness (generalized): Secondary | ICD-10-CM

## 2021-06-20 DIAGNOSIS — R600 Localized edema: Secondary | ICD-10-CM | POA: Diagnosis not present

## 2021-06-20 DIAGNOSIS — R262 Difficulty in walking, not elsewhere classified: Secondary | ICD-10-CM

## 2021-06-20 DIAGNOSIS — M25561 Pain in right knee: Secondary | ICD-10-CM

## 2021-06-20 NOTE — Therapy (Signed)
[facility] [address] [place], [place], [address] Phone: [phone]   Fax:  [phone]  Physical Therapy Treatment  Patient Details  Name: Leslie [name] MRN: [id number] Date of Birth: Nov 24, 1950 Referring Provider (PT): Doneen Poisson MD   Encounter Date: 06/20/2021   PT End of Session - 06/20/21 1650     Visit Number 7    Number of Visits 16    Date for PT Re-Evaluation 07/20/21    Authorization Type [employer]    Progress Note Due on Visit 10    PT Start Time 1302    PT Stop Time 1355    PT Time Calculation (min) 53 min    Activity Tolerance Patient tolerated treatment well;No increased pain    Behavior During Therapy [facility] for tasks assessed/performed             Past Medical History:  Diagnosis Date   ADD (attention deficit disorder)    Allergy    Anemia    Arthritis 07/22/2014   osteoarthritis left knee   Colon polyps    Hypertension    Left knee pain    chronic   Narcotic addiction (HCC)    Obesity    post gastric bypass   Vitamin D deficiency     Past Surgical History:  Procedure Laterality Date   arthscopic knee surgery Left    several times   BREAST BIOPSY     BREAST SURGERY  years ago   breast biopsy   CHOLECYSTECTOMY     EYE SURGERY Bilateral 2013   Toric Lens implants   EYE SURGERY Right 2014   corneal amniotic membrane   GASTRIC BYPASS  2003   KIDNEY DONATION  2004   TOTAL KNEE ARTHROPLASTY Left 06/16/2015   Procedure: LEFT TOTAL KNEE ARTHROPLASTY;  Surgeon: Kathryne Hitch, MD;  Location: [facility];  Service: Orthopedics;  Laterality: Left;   TOTAL KNEE ARTHROPLASTY Right 05/04/2021   Procedure: RIGHT TOTAL KNEE ARTHROPLASTY;  Surgeon: Kathryne Hitch, MD;  Location: [facility];  Service: Orthopedics;  Laterality: Right;    There were no vitals filed for this visit.   Subjective Assessment - 06/20/21 1307     Subjective Leslie [name] notes her sciatica hasn't been "as bad" the  past few days.  She notes she "doesn't trust" her R knee and is afraid it will give way.    Pertinent History HTN, R sciatica, previous L TKA    Limitations Sitting;Walking;Lifting;House hold activities;Standing    How long can you sit comfortably? Stiff with sit to stand transitions and very limited due to R sciatica    How long can you stand comfortably? 10 minutes due to R sciatica and fatigue    How long can you walk comfortably? 10-15 minutes with walker due to R sciatica and fatigue    Patient Stated Goals Get back home and be independent    Currently in Pain? Yes    Pain Score 5     Pain Location Knee    Pain Orientation Right    Pain Descriptors / Indicators Aching    Pain Type Surgical pain    Pain Radiating Towards 3/10 R sciatica to the foot    Pain Onset More than a month ago    Pain Frequency Intermittent    Aggravating Factors  R knee affects sleeping, prolonged standing and walking    Pain Relieving Factors Ice and motrin    Effect of Pain on Daily Activities Has switched to a  cane but has some instability, walker recommended for longer distances    Pain Onset More than a month ago                               [facility] Adult PT Treatment/Exercise - 06/20/21 0001       Therapeutic Activites    Therapeutic Activities Other Therapeutic Activities    Other Therapeutic Activities Hip hiking (shoulder on doorframe) for postural correction, sciatica reduction, improved gait quality/endurance and less restrictive AD; worked on log roll for bed mobility and posture with all activities in the clinic (including reaching for her cane)      Neuro Re-ed    Neuro Re-ed Details  Tandem balance eyes open 4X 30 seconds      Exercises   Exercises Knee/Hip      Knee/Hip Exercises: Stretches   Other Knee/Hip Stretches Tailgate knee flexion R knee 1 minute    Other Knee/Hip Stretches Supine R knee flexion with belt assist 10X 10 seconds and quadriceps set in between  sets      Knee/Hip Exercises: Machines for Strengthening   Cybex Leg Press 87# 15X slow eccentrics and full extension double leg and 37# 10X slow eccentrics and full extension single leg      Knee/Hip Exercises: Standing   Other Standing Knee Exercises Trunk extension AROM 10X 3 seconds (hips forward)    Other Standing Knee Exercises Shoulder blade pinches 10X 5 seconds      Knee/Hip Exercises: Supine   Bridges Strengthening;Both;2 sets;10 reps;Limitations    Bridges Limitations 5 seconds      Modalities   Modalities Vasopneumatic      Vasopneumatic   Number Minutes Vasopneumatic  10 minutes    Vasopnuematic Location  Knee    Vasopneumatic Pressure Medium    Vasopneumatic Temperature  34                     PT Education - 06/20/21 1647     Education Details Progressed strengthening (quadriceps and low back).  Worked a lot on Hospital doctor with exercises and ADLs (log roll and frequent postural, body mechanics cues).  Continue to work on quadriceps and low back strength, balance and gait quality to get Leslie [name] safe to return home as she continues to live with her daughter due to fears of sciatica and her knee "giving way."    Person(s) Educated Patient    Methods Explanation;Demonstration;Tactile cues;Verbal cues;Handout    Comprehension Tactile cues required;Verbalized understanding;Returned demonstration;Need further instruction;Verbal cues required              PT Short Term Goals - 06/13/21 1029       PT SHORT TERM GOAL #1   Title Improve R knee AROM to -3 to 120 degrees.    Status Achieved               PT Long Term Goals - 06/13/21 1035       PT LONG TERM GOAL #1   Title Improve FOTO to 60.    Status On-going      PT LONG TERM GOAL #2   Title Leslie [name] will report R knee pain consistently 0-3/10 on the Numeric Pain Rating Scale.    Status On-going      PT LONG TERM GOAL #3   Title Improve R knee extension AROM to 0 degrees.     Status On-going  PT LONG TERM GOAL #4   Title Leslie [name] will be able to walk safely without a walker and be able to do stairs for moving back home independently.    Status On-going      PT LONG TERM GOAL #5   Title Leslie [name] will be independent with her long-term HEP at DC.    Status On-going                   Plan - 06/20/21 1651     Clinical Impression Statement Leslie [name] looks as good as she has ever looked post-TKA.  Sciatica did not limit her visit or gait today (although symptoms are still present).  She is transitioning to a cane although she does not "trust" her R knee.  Continue quadriceps, low back strength work with balance and gait activities to return Leslie [name] to living independently.    Personal Factors and Comorbidities Comorbidity 3+    Comorbidities HTN, R sciatica and previous L TKA.    Examination-Activity Limitations Stairs;Stand;Dressing;Bed Mobility;Bend;Sit;Transfers;Sleep;Lift;Carry;Locomotion Level;Squat    Examination-Participation Restrictions Interpersonal Relationship;Community Activity;Driving    Stability/Clinical Decision Making Stable/Uncomplicated    Rehab Potential Good    PT Frequency 2x / week    PT Duration 6 weeks    PT Treatment/Interventions ADLs/Self Care Home Management;Electrical Stimulation;Cryotherapy;Gait training;Stair training;Functional mobility training;Therapeutic activities;Neuromuscular re-education;Therapeutic exercise;Patient/family education;Manual techniques;Passive range of motion;Vasopneumatic Device    PT Next Visit Plan Knee extension AROM, edema control, quadriceps and hip abductors strength, interventions to strengthen her back and reduce sciatica    PT Home Exercise Plan Access Code: [email or web address]    Consulted and Agree with Plan of Care Patient             Patient will benefit from skilled therapeutic intervention in order to improve the following deficits and impairments:  Abnormal gait, Decreased activity  tolerance, Decreased endurance, Decreased range of motion, Decreased strength, Difficulty walking, Increased edema, Impaired flexibility, Improper body mechanics, Postural dysfunction, Pain  Visit Diagnosis: Difficulty walking  Muscle weakness (generalized)  Stiffness of right knee, not elsewhere classified  Right knee pain, unspecified chronicity  Localized edema     Problem List Patient Active Problem List   Diagnosis Date Noted   Status post right knee replacement 05/04/2021   Vitamin B12 deficiency 03/14/2021   Macrocytosis without anemia 03/14/2021   Vitamin D deficiency 02/17/2020   Sleep disorder 07/10/2018   ADHD (attention deficit hyperactivity disorder) 10/01/2017   Unilateral primary osteoarthritis, right knee 08/20/2017   Chronic pain of both shoulders 08/20/2017   Complete tear of right rotator cuff 04/29/2017   Osteoarthritis of left knee 06/16/2015   Status post total left knee replacement 06/16/2015   Attention deficit disorder 09/14/2007   COLONIC POLYPS, HX OF 08/03/2007   Essential hypertension 01/20/2007   OBESITY NOS 12/04/2006   Allergic rhinitis 12/04/2006   GASTROJEJUNOSTOMY, HX OF 12/04/2006   BREAST BIOPSY, HX OF 12/04/2006    Cherlyn Cushing, PT, MPT 06/20/2021, 4:55 PM  [facility] Therapy [address] [place], [place], [address] Phone: [phone]   Fax:  [phone]  Name: Leslie [name] MRN: [id number] Date of Birth: Nov 19, 1950

## 2021-06-20 NOTE — Patient Instructions (Addendum)
Access Code: [email or web address] URL: [email or web address] Date: 06/20/2021 Prepared by: Pauletta Browns  Exercises Supine Quadricep Sets - 3-5 x daily - 7 x weekly - 2-3 sets - 10 reps - 5 second hold Knee Flexion - 1 x daily - 7 x weekly - 3 sets - 10 reps Standing Lumbar Extension at Wall - Forearms - 5 x daily - 7 x weekly - 1 sets - 5 reps - 3 seconds hold Standing Scapular Retraction - 5 x daily - 7 x weekly - 1 sets - 5 reps - 5 second hold Standing Hip Hiking - 2-3 x daily - 7 x weekly - 1 sets - 10 reps - 3 seconds hold Sit to Stand with Armchair - 2 x daily - 7 x weekly - 1 sets - 10 reps Bridge - 1-2 x daily - 7 x weekly - 1 sets - 10 reps - 5 seconds hold

## 2021-06-22 ENCOUNTER — Other Ambulatory Visit: Payer: Self-pay | Admitting: Internal Medicine

## 2021-06-22 ENCOUNTER — Encounter: Payer: Medicare PPO | Admitting: Rehabilitative and Restorative Service Providers"

## 2021-06-22 DIAGNOSIS — F900 Attention-deficit hyperactivity disorder, predominantly inattentive type: Secondary | ICD-10-CM

## 2021-06-22 MED ORDER — AMPHETAMINE-DEXTROAMPHETAMINE 20 MG PO TABS: ORAL_TABLET | ORAL | 0 refills | Status: DC

## 2021-06-22 MED ORDER — AMPHETAMINE-DEXTROAMPHET ER 30 MG PO CP24: 30.0000 mg | ORAL_CAPSULE | ORAL | 0 refills | Status: DC

## 2021-06-22 NOTE — Telephone Encounter (Signed)
MyChart visit scheduled 06/28/21. Patient is requesting Adderall 30 mg only.

## 2021-06-26 ENCOUNTER — Other Ambulatory Visit: Payer: Self-pay | Admitting: Orthopaedic Surgery

## 2021-06-26 MED ORDER — AMPHETAMINE-DEXTROAMPHET ER 30 MG PO CP24: 30.0000 mg | ORAL_CAPSULE | ORAL | 0 refills | Status: DC

## 2021-06-26 MED ORDER — HYDROCODONE-ACETAMINOPHEN 5-325 MG PO TABS: 1.0000 | ORAL_TABLET | Freq: Four times a day (QID) | ORAL | 0 refills | Status: DC | PRN

## 2021-06-26 NOTE — Addendum Note (Signed)
Addended by: Kern Reap B on: 06/26/2021 07:29 AM   Modules accepted: Orders

## 2021-06-27 ENCOUNTER — Encounter: Payer: Medicare PPO | Admitting: Rehabilitative and Restorative Service Providers"

## 2021-06-28 ENCOUNTER — Telehealth (INDEPENDENT_AMBULATORY_CARE_PROVIDER_SITE_OTHER): Payer: Medicare PPO | Admitting: Internal Medicine

## 2021-06-28 DIAGNOSIS — G479 Sleep disorder, unspecified: Secondary | ICD-10-CM | POA: Diagnosis not present

## 2021-06-28 DIAGNOSIS — F900 Attention-deficit hyperactivity disorder, predominantly inattentive type: Secondary | ICD-10-CM | POA: Diagnosis not present

## 2021-06-28 MED ORDER — AMPHETAMINE-DEXTROAMPHETAMINE 20 MG PO TABS: ORAL_TABLET | ORAL | 0 refills | Status: DC

## 2021-06-28 MED ORDER — ZOLPIDEM TARTRATE 10 MG PO TABS: 10.0000 mg | ORAL_TABLET | Freq: Every day | ORAL | 2 refills | Status: DC

## 2021-06-28 MED ORDER — AMPHETAMINE-DEXTROAMPHET ER 30 MG PO CP24: 30.0000 mg | ORAL_CAPSULE | ORAL | 0 refills | Status: DC

## 2021-06-28 MED ORDER — ALPRAZOLAM 0.25 MG PO TABS: ORAL_TABLET | ORAL | 2 refills | Status: DC

## 2021-06-28 NOTE — Progress Notes (Signed)
Virtual Visit via Video Note  I connected with Leslie [name] on 06/28/21 at  4:00 PM EST by a video enabled telemedicine application and verified that I am speaking with the correct person using two identifiers.  Location patient: home Location provider: work office Persons participating in the virtual visit: patient, provider  I discussed the limitations of evaluation and management by telemedicine and the availability of in person appointments. The patient expressed understanding and agreed to proceed.   HPI: She has scheduled this visit for the purpose of medication refills.  She takes Adderall XR 30 mg in the morning and an additional 20 mg at bedtime.  She also takes Ambien at nighttime and alprazolam as needed.  She recently had knee surgery and has been dealing with sciatica and is scheduled for an epidural injection next week.   ROS: Constitutional: Denies fever, chills, diaphoresis, appetite change and fatigue.  HEENT: Denies photophobia, eye pain, redness, hearing loss, ear pain, congestion, sore throat, rhinorrhea, sneezing, mouth sores, trouble swallowing, neck pain, neck stiffness and tinnitus.   Respiratory: Denies SOB, DOE, cough, chest tightness,  and wheezing.   Cardiovascular: Denies chest pain, palpitations and leg swelling.  Gastrointestinal: Denies nausea, vomiting, abdominal pain, diarrhea, constipation, blood in stool and abdominal distention.  Genitourinary: Denies dysuria, urgency, frequency, hematuria, flank pain and difficulty urinating.  Endocrine: Denies: hot or cold intolerance, sweats, changes in hair or nails, polyuria, polydipsia. Musculoskeletal: Denies myalgias, back pain, joint swelling, arthralgias and gait problem.  Skin: Denies pallor, rash and wound.  Neurological: Denies dizziness, seizures, syncope, weakness, light-headedness, numbness and headaches.  Hematological: Denies adenopathy. Easy bruising, personal or family bleeding history   Psychiatric/Behavioral: Denies suicidal ideation, mood changes, confusion, nervousness, sleep disturbance and agitation   Past Medical History:  Diagnosis Date   ADD (attention deficit disorder)    Allergy    Anemia    Arthritis 07/22/2014   osteoarthritis left knee   Colon polyps    Hypertension    Left knee pain    chronic   Narcotic addiction (HCC)    Obesity    post gastric bypass   Vitamin D deficiency     Past Surgical History:  Procedure Laterality Date   arthscopic knee surgery Left    several times   BREAST BIOPSY     BREAST SURGERY  years ago   breast biopsy   CHOLECYSTECTOMY     EYE SURGERY Bilateral 2013   Toric Lens implants   EYE SURGERY Right 2014   corneal amniotic membrane   GASTRIC BYPASS  2003   KIDNEY DONATION  2004   TOTAL KNEE ARTHROPLASTY Left 06/16/2015   Procedure: LEFT TOTAL KNEE ARTHROPLASTY;  Surgeon: Kathryne Hitch, MD;  Location: [facility];  Service: Orthopedics;  Laterality: Left;   TOTAL KNEE ARTHROPLASTY Right 05/04/2021   Procedure: RIGHT TOTAL KNEE ARTHROPLASTY;  Surgeon: Kathryne Hitch, MD;  Location: [facility];  Service: Orthopedics;  Laterality: Right;    Family History  Problem Relation Age of Onset   Heart disease Mother        post CABG history of CHF   COPD Father    Diabetes Sister    Hypertension Sister        post renal transplant   Heart disease Brother        CAD    SOCIAL HX:   reports that she quit smoking about 28 years ago. Her smoking use included cigarettes. She has never used smokeless  tobacco. She reports current alcohol use. She reports that she does not use drugs.   Current Outpatient Medications:    amphetamine-dextroamphetamine (ADDERALL XR) 30 MG 24 hr capsule, Take 1 capsule (30 mg total) by mouth every morning., Disp: 30 capsule, Rfl: 0   amphetamine-dextroamphetamine (ADDERALL) 20 MG tablet, Take half to one tablet daily, Disp: 30 tablet, Rfl: 0   amphetamine-dextroamphetamine  (ADDERALL) 20 MG tablet, Take half to one tablet daily, Disp: 30 tablet, Rfl: 0   aspirin 81 MG chewable tablet, Chew 1 tablet (81 mg total) by mouth 2 (two) times daily., Disp: 60 tablet, Rfl: 0   FLUoxetine (PROZAC) 40 MG capsule, TAKE 1 CAPSULE(40 MG) BY MOUTH EVERY MORNING, Disp: 90 capsule, Rfl: 1   gabapentin (NEURONTIN) 100 MG capsule, Take 1 capsule (100 mg total) by mouth 3 (three) times daily., Disp: 60 capsule, Rfl: 1   hydrochlorothiazide (HYDRODIURIL) 25 MG tablet, TAKE 1 TABLET(25 MG) BY MOUTH DAILY, Disp: 90 tablet, Rfl: 1   HYDROcodone-acetaminophen (NORCO) 5-325 MG tablet, Take 1 tablet by mouth every 6 (six) hours as needed for moderate pain., Disp: 30 tablet, Rfl: 0   lamoTRIgine (LAMICTAL) 100 MG tablet, TAKE 1 TABLET(100 MG) BY MOUTH TWICE DAILY, Disp: 180 tablet, Rfl: 1   Vitamin D, Ergocalciferol, (DRISDOL) 1.25 MG (50000 UNIT) CAPS capsule, TAKE 1 CAPSULE BY MOUTH EVERY 7 DAYS FOR 12 DOSES, Disp: 12 capsule, Rfl: 0   ALPRAZolam (XANAX) 0.25 MG tablet, TAKE 1 TABLET(0.25 MG) BY MOUTH AT BEDTIME AS NEEDED FOR ANXIETY, Disp: 30 tablet, Rfl: 2   amphetamine-dextroamphetamine (ADDERALL XR) 30 MG 24 hr capsule, Take 1 capsule (30 mg total) by mouth every morning., Disp: 30 capsule, Rfl: 0   amphetamine-dextroamphetamine (ADDERALL XR) 30 MG 24 hr capsule, Take 1 capsule (30 mg total) by mouth every morning., Disp: 30 capsule, Rfl: 0   amphetamine-dextroamphetamine (ADDERALL) 20 MG tablet, Take 1/2 to 1 tablet by mouth every evening if needed., Disp: 30 tablet, Rfl: 0   zolpidem (AMBIEN) 10 MG tablet, Take 1 tablet (10 mg total) by mouth at bedtime., Disp: 30 tablet, Rfl: 2  EXAM:   VITALS per patient if applicable: None reported  GENERAL: alert, oriented, appears well and in no acute distress  HEENT: atraumatic, conjunttiva clear, no obvious abnormalities on inspection of external nose and ears  NECK: normal movements of the head and neck  LUNGS: on inspection no signs of  respiratory distress, breathing rate appears normal, no obvious gross increased work of breathing, gasping or wheezing  CV: no obvious cyanosis  MS: moves all visible extremities without noticeable abnormality  PSYCH/NEURO: pleasant and cooperative, no obvious depression or anxiety, speech and thought processing grossly intact  ASSESSMENT AND PLAN:   Sleep disorder  - Plan: ALPRAZolam (XANAX) 0.25 MG tablet, zolpidem (AMBIEN) 10 MG tablet  Attention deficit hyperactivity disorder (ADHD), predominantly inattentive type  -BMP reviewed, overdose risk score is 320, she has 1 red flag for number provided, however all recent prescriptions have been by myself for Dr. Magnus Ivan who is her orthopedic surgeon. -Refill Adderall XR 20 mg to take 1 tablet daily for total of 30 tablets a month x3 months, refill Adderall 20 mg to take 1 tablet in the afternoon for a total of 30 tablets a month x3 months.     I discussed the assessment and treatment plan with the patient. The patient was provided an opportunity to ask questions and all were answered. The patient agreed with the plan and demonstrated an  understanding of the instructions.   The patient was advised to call back or seek an in-person evaluation if the symptoms worsen or if the condition fails to improve as anticipated.    Lelon Frohlich, MD  [facility]

## 2021-06-29 ENCOUNTER — Ambulatory Visit: Payer: Medicare PPO | Admitting: Rehabilitative and Restorative Service Providers"

## 2021-06-29 ENCOUNTER — Encounter: Payer: Self-pay | Admitting: Rehabilitative and Restorative Service Providers"

## 2021-06-29 ENCOUNTER — Other Ambulatory Visit: Payer: Self-pay

## 2021-06-29 DIAGNOSIS — R600 Localized edema: Secondary | ICD-10-CM | POA: Diagnosis not present

## 2021-06-29 DIAGNOSIS — M6281 Muscle weakness (generalized): Secondary | ICD-10-CM | POA: Diagnosis not present

## 2021-06-29 DIAGNOSIS — M25661 Stiffness of right knee, not elsewhere classified: Secondary | ICD-10-CM

## 2021-06-29 DIAGNOSIS — M25561 Pain in right knee: Secondary | ICD-10-CM | POA: Diagnosis not present

## 2021-06-29 DIAGNOSIS — R262 Difficulty in walking, not elsewhere classified: Secondary | ICD-10-CM | POA: Diagnosis not present

## 2021-06-29 NOTE — Patient Instructions (Signed)
Access Code: [email or web address] URL: [email or web address] Date: 06/29/2021 Prepared by: Pauletta Browns  Exercises Supine Quadricep Sets - 3-5 x daily - 7 x weekly - 2-3 sets - 10 reps - 5 second hold Standing Lumbar Extension at Wall - Forearms - 5 x daily - 7 x weekly - 1 sets - 5 reps - 3 seconds hold Standing Scapular Retraction - 5 x daily - 7 x weekly - 1 sets - 5 reps - 5 second hold Standing Hip Hiking - 5-10 x daily - 7 x weekly - 1 sets - 2-3 reps - 3 seconds hold Sit to Stand with Armchair - 2 x daily - 7 x weekly - 1 sets - 5 reps Bridge - 2 x daily - 7 x weekly - 2 sets - 10 reps - 5 seconds hold Tandem Stance - 2 x daily - 7 x weekly - 1 sets - 4-5 reps - 20 second hold

## 2021-06-29 NOTE — Therapy (Signed)
[facility] [address] [place], [place], [address] Phone: [phone]   Fax:  [phone]  Physical Therapy Treatment/Progress Note  Patient Details  Name: Leslie [name] MRN: [id number] Date of Birth: [birth year] Referring Provider (PT): Doneen Poisson MD  Progress Note Reporting Period 05/24/2021 to 06/29/2021  See note below for Objective Data and Assessment of Progress/Goals.    Referring diagnosis? M17.11 Treatment diagnosis? (if different than referring diagnosis) R26.2  M62.81  M25.661  M25.561  R60.0 What was this (referring dx) caused by? [x]  Surgery [x]  Fall [x]  Ongoing issue [x]  Arthritis []  Other: ____________  Laterality: [x]  Rt []  Lt []  Both  Check all possible CPT codes:  *CHOOSE 10 OR LESS*    [x]  97110 (Therapeutic Exercise)  []  92507 (SLP Treatment)  [x]  97112 (Neuro Re-ed)   []  92526 (Swallowing Treatment)   [x]  97116 (Gait Training)   []  (Cognitive Training, 1st 15 minutes) [x]  97140 (Manual Therapy)   []  97130 (Cognitive Training, each add'l 15 minutes)  [x]  97530 (Therapeutic Activities)  []  Other, List CPT Code ____________    [x]  97535 (Self Care)       []  All codes above (97110 - 97535)  []  97012 (Mechanical Traction)  [x]  97014 (E-stim Unattended)  []  97032 (E-stim manual)  []  97033 (Ionto)  []  97035 (Ultrasound)  []  97760 (Orthotic Fit) [x]  (Physical Performance Training) []  (Aquatic Therapy) []  97034 (Contrast Bath) []  (Paraffin) []  97597 (Wound Care 1st 20 sq cm) []  97598 (Wound Care each add'l 20 sq cm) [x]  (Vasopneumatic Device) []  [phone] ) []  (Prosthetic Training)   Encounter Date: 06/29/2021   PT End of Session - 06/29/21 1654     Visit Number 8    Number of Visits 16    Date for PT Re-Evaluation 08/10/21    Authorization Type [employer]    Progress Note Due on Visit 16    PT Start Time 1306    PT Stop Time 1355    PT Time  Calculation (min) 49 min    Activity Tolerance Patient tolerated treatment well;No increased pain    Behavior During Therapy [facility] for tasks assessed/performed             Past Medical History:  Diagnosis Date   ADD (attention deficit disorder)    Allergy    Anemia    Arthritis 07/22/2014   osteoarthritis left knee   Colon polyps    Hypertension    Left knee pain    chronic   Narcotic addiction (HCC)    Obesity    post gastric bypass   Vitamin D deficiency     Past Surgical History:  Procedure Laterality Date   arthscopic knee surgery Left    several times   BREAST BIOPSY     BREAST SURGERY  years ago   breast biopsy   CHOLECYSTECTOMY     EYE SURGERY Bilateral 2013   Toric Lens implants   EYE SURGERY Right 2014   corneal amniotic membrane   GASTRIC BYPASS  2003   KIDNEY DONATION  2004   TOTAL KNEE ARTHROPLASTY Left 06/16/2015   Procedure: LEFT TOTAL KNEE ARTHROPLASTY;  Surgeon: , MD;  Location: [facility];  Service: Orthopedics;  Laterality: Left;   TOTAL KNEE ARTHROPLASTY Right 05/04/2021   Procedure: RIGHT TOTAL KNEE ARTHROPLASTY;  Surgeon: , MD;  Location: [facility];  Service: Orthopedics;  Laterality: Right;    There were no vitals  filed for this visit.   Subjective Assessment - 06/29/21 1324     Subjective Leslie [name] is taking hydrocodone 3X/day.  Sleep has been affected with frequent interruptions.  [name] did report falling at home and landing in the bushes.  HEP has been less frequent "something, not everything every day."    Pertinent History HTN, R sciatica, previous L TKA    Limitations Sitting;Walking;Lifting;House hold activities;Standing    How long can you sit comfortably? Stiff with sit to stand transitions and very limited due to R sciatica    How long can you stand comfortably? < 10 minutes due to R sciatica and fatigue post fall    How long can you walk comfortably? <10 minutes with cane due to R sciatica and  fatigue post-fall    Patient Stated Goals Get back home and be independent    Currently in Pain? Yes    Pain Score 5     Pain Location Knee    Pain Orientation Right    Pain Descriptors / Indicators Sore;Aching    Pain Type Surgical pain    Pain Radiating Towards 4/10 R sciatica to foot (can be 2/10 with hydrocodone)    Pain Onset More than a month ago    Pain Frequency Constant    Aggravating Factors  R knee and sciatica affect sleeping, prolonged postures and WB endurance/function    Pain Relieving Factors Hydrocodone, ice and motrin    Effect of Pain on Daily Activities Using cane more often after her fall (had transitioned to no assistive device before her fall)    Multiple Pain Sites No    Pain Onset More than a month ago                [facility] PT Assessment - 06/29/21 0001       ROM / Strength   AROM / PROM / Strength AROM;Strength      AROM   Overall AROM  Deficits    AROM Assessment Site Knee    Right/Left Knee Right    Right Knee Extension -3    Right Knee Flexion 118      Strength   Overall Strength Deficits    Strength Assessment Site Knee    Right/Left Knee Left;Right    Right Knee Extension --   27.7 pounds   Left Knee Extension --   53.2 pounds                          [facility] Adult PT Treatment/Exercise - 06/29/21 0001       Therapeutic Activites    Therapeutic Activities Other Therapeutic Activities    Other Therapeutic Activities Hip hiking (shoulder on doorframe) for postural correction, sciatica reduction, improved gait quality/endurance and less restrictive AD.  Had to do sets of 2-3 repetitions due to significant hip, knee and core weakness.      Neuro Re-ed    Neuro Re-ed Details  Tandem balance eyes open 4X 30 seconds      Exercises   Exercises Knee/Hip      Knee/Hip Exercises: Stretches   Other Knee/Hip Stretches --    Other Knee/Hip Stretches --      Knee/Hip Exercises: Machines for Strengthening   Cybex Leg Press --       Knee/Hip Exercises: Standing   Other Standing Knee Exercises Trunk extension AROM 10X 3 seconds (hips forward)    Other Standing Knee Exercises Shoulder blade pinches 10X 5 seconds  Knee/Hip Exercises: Seated   Sit to Sand 5 reps;without UE support;Other (comment)   slow eccentrics, SBP and trunk extension AROM at the top     Knee/Hip Exercises: Supine   Quad Sets Strengthening;Both;2 sets;10 reps;Limitations    Quad Sets Limitations 5 seconds (toes back, press knees down and tighten thighs)    Bridges Strengthening;Both;2 sets;10 reps;Limitations    Bridges Limitations 5 seconds      Modalities   Modalities Vasopneumatic      Vasopneumatic   Number Minutes Vasopneumatic  10 minutes    Vasopnuematic Location  Knee    Vasopneumatic Pressure Medium    Vasopneumatic Temperature  34                     PT Education - 06/29/21 1650     Education Details Reviewed Leslie [name] HEP with emphasis on activities that will address multiple impairments (quadriceps strength, hip abductors strength, core strength) and reduce sciatica that is affecting her gait, function and recovery from TKA.    Person(s) Educated Patient    Methods Explanation;Demonstration;Tactile cues;Verbal cues;Handout    Comprehension Verbalized understanding;Tactile cues required;Need further instruction;Returned demonstration;Verbal cues required              PT Short Term Goals - 06/13/21 1029       PT SHORT TERM GOAL #1   Title Improve R knee AROM to -3 to 120 degrees.    Status Achieved               PT Long Term Goals - 06/29/21 1651       PT LONG TERM GOAL #1   Title Improve FOTO to 60.    Baseline 44 (treading water due to sciatica)    Time 6    Period Weeks    Status Revised    Target Date 08/10/21      PT LONG TERM GOAL #2   Title Leslie [name] will report R knee pain consistently 0-3/10 on the Numeric Pain Rating Scale.    Baseline Can be > 5/10 (sciatica and weakness)     Time 6    Period Weeks    Status Revised    Target Date 08/10/21      PT LONG TERM GOAL #3   Title Improve R knee extension AROM to 0 degrees.    Baseline -3 (was -6) degrees    Time 6    Period Weeks    Status Revised    Target Date 08/10/21      PT LONG TERM GOAL #4   Title Leslie [name] will be able to walk safely without a walker and be able to do stairs for moving back home independently.    Baseline Uses a cane and is living with her family due to needing help with ADLs.    Time 6    Period Weeks    Status Revised    Target Date 08/10/21      PT LONG TERM GOAL #5   Title Leslie [name] will be independent with her long-term HEP at DC.    Baseline Updated today    Time 6    Status Revised    Target Date 08/10/21                   Plan - 06/29/21 1655     Clinical Impression Statement Leslie [name] is making slower progress post-TKA than we would like.  Sciatica has certainly limited her progress as has frequent absences from  supervised PT due to illness and scaitica flare-ups.  She is seeing a spine specialist for a (possible) epidural next week.  This, combined with activities designed to address multiple areas of impairments (quadriceps, hip abductors and low back weakness) should allow Leslie [name] to make more rapid progress.  If HEP compliance and attendance in PT improve, her prognosis is more favorable.    Personal Factors and Comorbidities Comorbidity 3+    Comorbidities HTN, R sciatica and previous L TKA.    Examination-Activity Limitations Stairs;Stand;Dressing;Bed Mobility;Bend;Sit;Transfers;Sleep;Lift;Carry;Locomotion Level;Squat    Examination-Participation Restrictions Interpersonal Relationship;Community Activity;Driving    Stability/Clinical Decision Making Stable/Uncomplicated    Rehab Potential Good    PT Frequency 2x / week    PT Duration 6 weeks    PT Treatment/Interventions ADLs/Self Care Home Management;Electrical Stimulation;Cryotherapy;Gait training;Stair  training;Functional mobility training;Therapeutic activities;Neuromuscular re-education;Therapeutic exercise;Patient/family education;Manual techniques;Passive range of motion;Vasopneumatic Device    PT Next Visit Plan Knee extension AROM and strength, edema control, quadriceps, low back, postural and hip abductors strength, interventions to reduce sciatica    PT Home Exercise Plan Access Code: [email or web address]    Consulted and Agree with Plan of Care Patient             Patient will benefit from skilled therapeutic intervention in order to improve the following deficits and impairments:  Abnormal gait, Decreased activity tolerance, Decreased endurance, Decreased range of motion, Decreased strength, Difficulty walking, Increased edema, Impaired flexibility, Improper body mechanics, Postural dysfunction, Pain  Visit Diagnosis: Difficulty walking  Muscle weakness (generalized)  Stiffness of right knee, not elsewhere classified  Right knee pain, unspecified chronicity  Localized edema     Problem List Patient Active Problem List   Diagnosis Date Noted   Status post right knee replacement 05/04/2021   Vitamin B12 deficiency 03/14/2021   Macrocytosis without anemia 03/14/2021   Vitamin D deficiency 02/17/2020   Sleep disorder 07/10/2018   ADHD (attention deficit hyperactivity disorder) 10/01/2017   Unilateral primary osteoarthritis, right knee 08/20/2017   Chronic pain of both shoulders 08/20/2017   Complete tear of right rotator cuff 04/29/2017   Osteoarthritis of left knee 06/16/2015   Status post total left knee replacement 06/16/2015   Attention deficit disorder 09/14/2007   COLONIC POLYPS, HX OF 08/03/2007   Essential hypertension 01/20/2007   OBESITY NOS 12/04/2006   Allergic rhinitis 12/04/2006   GASTROJEJUNOSTOMY, HX OF 12/04/2006   BREAST BIOPSY, HX OF 12/04/2006    Cherlyn Cushing, PT, MPT 06/29/2021, 4:59 PM  [facility] [address] [place], [place], [address] Phone: [phone]   Fax:  [phone]  Name: Leslie [name] MRN: [id number] Date of Birth: [birth year]

## 2021-07-03 ENCOUNTER — Encounter: Payer: Medicare PPO | Admitting: Physical Therapy

## 2021-07-03 ENCOUNTER — Other Ambulatory Visit: Payer: Self-pay | Admitting: Orthopaedic Surgery

## 2021-07-03 MED ORDER — HYDROCODONE-ACETAMINOPHEN 5-325 MG PO TABS: 1.0000 | ORAL_TABLET | Freq: Four times a day (QID) | ORAL | 0 refills | Status: DC | PRN

## 2021-07-05 ENCOUNTER — Ambulatory Visit (INDEPENDENT_AMBULATORY_CARE_PROVIDER_SITE_OTHER): Payer: Medicare PPO | Admitting: Physical Medicine and Rehabilitation

## 2021-07-05 ENCOUNTER — Encounter: Payer: Self-pay | Admitting: Physical Medicine and Rehabilitation

## 2021-07-05 ENCOUNTER — Other Ambulatory Visit: Payer: Self-pay

## 2021-07-05 ENCOUNTER — Ambulatory Visit: Payer: Self-pay

## 2021-07-05 VITALS — BP 141/87 | HR 98

## 2021-07-05 DIAGNOSIS — M5416 Radiculopathy, lumbar region: Secondary | ICD-10-CM

## 2021-07-05 DIAGNOSIS — M48062 Spinal stenosis, lumbar region with neurogenic claudication: Secondary | ICD-10-CM

## 2021-07-05 MED ORDER — METHYLPREDNISOLONE ACETATE 80 MG/ML IJ SUSP
80.0000 mg | Freq: Once | INTRAMUSCULAR | Status: AC
Start: 2021-07-05 — End: 2021-07-05
  Administered 2021-07-05: 80 mg

## 2021-07-05 NOTE — Progress Notes (Signed)
Pt state lower back pain that travels to her buttocks and down her right leg and foot. Pt state any movements makes the pain worse. Pt state she takes pain meds and PT to help ease her pain.  Numeric Pain Rating Scale and Functional Assessment Average Pain 3   In the last MONTH (on 0-10 scale) has pain interfered with the following?  1. General activity like being  able to carry out your everyday physical activities such as walking, climbing stairs, carrying groceries, or moving a chair?  Rating(5)   +Driver, -BT, -Dye Allergies.

## 2021-07-05 NOTE — Patient Instructions (Signed)

## 2021-07-05 NOTE — Procedures (Signed)
Lumbosacral Transforaminal Epidural Steroid Injection - Sub-Pedicular Approach with Fluoroscopic Guidance  Patient: Leslie [name]      Date of Birth: [birth year] MRN: [id number] PCP: Philip Aspen, Limmie Patricia, MD      Visit Date: 07/05/2021   Universal Protocol:    Date/Time: 07/05/2021  Consent Given By: the patient  Position: PRONE  Additional Comments: Vital signs were monitored before and after the procedure. Patient was prepped and draped in the usual sterile fashion. The correct patient, procedure, and site was verified.   Injection Procedure Details:   Procedure diagnoses: Lumbar radiculopathy [M54.16]    Meds Administered:  Meds ordered this encounter  Medications   methylPREDNISolone acetate (DEPO-MEDROL) injection 80 mg    Laterality: Right  Location/Site: L4  Needle:5.0 in., 22 ga.  Short bevel or Quincke spinal needle  Needle Placement: Transforaminal  Findings:    -Comments: Excellent flow of contrast along the nerve, nerve root and into the epidural space.  Procedure Details: After squaring off the end-plates to get a true AP view, the C-arm was positioned so that an oblique view of the foramen as noted above was visualized. The target area is just inferior to the "nose of the scotty dog" or sub pedicular. The soft tissues overlying this structure were infiltrated with 2-3 ml. of 1% Lidocaine without Epinephrine.  The spinal needle was inserted toward the target using a "trajectory" view along the fluoroscope beam.  Under AP and lateral visualization, the needle was advanced so it did not puncture dura and was located close the 6 O'Clock position of the pedical in AP tracterory. Biplanar projections were used to confirm position. Aspiration was confirmed to be negative for CSF and/or blood. A 1-2 ml. volume of Isovue-250 was injected and flow of contrast was noted at each level. Radiographs were obtained for documentation purposes.   After attaining  the desired flow of contrast documented above, a 0.5 to 1.0 ml test dose of 0.25% Marcaine was injected into each respective transforaminal space.  The patient was observed for 90 seconds post injection.  After no sensory deficits were reported, and normal lower extremity motor function was noted,   the above injectate was administered so that equal amounts of the injectate were placed at each foramen (level) into the transforaminal epidural space.   Additional Comments:  No complications occurred Dressing: 2 x 2 sterile gauze and Band-Aid    Post-procedure details: Patient was observed during the procedure. Post-procedure instructions were reviewed.  Patient left the clinic in stable condition.

## 2021-07-05 NOTE — Progress Notes (Signed)
Leslie [name] - 70 y.o. female MRN [id number]  Date of birth: July 26, 1950  Office Visit Note: Visit Date: 07/05/2021 PCP: Isaac Bliss, Rayford Halsted, MD Referred by: Isaac Bliss, Estel*  Subjective: Chief Complaint  Patient presents with   Lower Back - Pain   Right Leg - Pain   Right Foot - Pain   HPI:  Leslie [name] is a 70 y.o. female who comes in today at the request of Dr. Jean Rosenthal for planned Right L4-5 Lumbar Transforaminal epidural steroid injection with fluoroscopic guidance.  The patient has failed conservative care including home exercise, medications, time and activity modification.  This injection will be diagnostic and hopefully therapeutic.  Please see requesting physician notes for further details and justification. MRI reviewed with images and spine model.  MRI reviewed in the note below.   ROS Otherwise per HPI.  Assessment & Plan: Visit Diagnoses:    ICD-10-CM   1. Lumbar radiculopathy  M54.16 XR C-ARM NO REPORT    Epidural Steroid injection    methylPREDNISolone acetate (DEPO-MEDROL) injection 80 mg    2. Spinal stenosis of lumbar region with neurogenic claudication  M48.062       Plan: No additional findings.   Meds & Orders:  Meds ordered this encounter  Medications   methylPREDNISolone acetate (DEPO-MEDROL) injection 80 mg    Orders Placed This Encounter  Procedures   XR C-ARM NO REPORT   Epidural Steroid injection    Follow-up: Return if symptoms worsen or fail to improve.   Procedures: No procedures performed  Lumbosacral Transforaminal Epidural Steroid Injection - Sub-Pedicular Approach with Fluoroscopic Guidance  Patient: Leslie [name]      Date of Birth: 12-09-1950 MRN: [id number] PCP: Isaac Bliss, Rayford Halsted, MD      Visit Date: 07/05/2021   Universal Protocol:    Date/Time: 07/05/2021  Consent Given By: the patient  Position: PRONE  Additional Comments: Vital signs were monitored before and  after the procedure. Patient was prepped and draped in the usual sterile fashion. The correct patient, procedure, and site was verified.   Injection Procedure Details:   Procedure diagnoses: Lumbar radiculopathy [M54.16]    Meds Administered:  Meds ordered this encounter  Medications   methylPREDNISolone acetate (DEPO-MEDROL) injection 80 mg    Laterality: Right  Location/Site: L4  Needle:5.0 in., 22 ga.  Short bevel or Quincke spinal needle  Needle Placement: Transforaminal  Findings:    -Comments: Excellent flow of contrast along the nerve, nerve root and into the epidural space.  Procedure Details: After squaring off the end-plates to get a true AP view, the C-arm was positioned so that an oblique view of the foramen as noted above was visualized. The target area is just inferior to the "nose of the scotty dog" or sub pedicular. The soft tissues overlying this structure were infiltrated with 2-3 ml. of 1% Lidocaine without Epinephrine.  The spinal needle was inserted toward the target using a "trajectory" view along the fluoroscope beam.  Under AP and lateral visualization, the needle was advanced so it did not puncture dura and was located close the 6 O'Clock position of the pedical in AP tracterory. Biplanar projections were used to confirm position. Aspiration was confirmed to be negative for CSF and/or blood. A 1-2 ml. volume of Isovue-250 was injected and flow of contrast was noted at each level. Radiographs were obtained for documentation purposes.   After attaining the desired flow of contrast documented above, a 0.5 to 1.0 ml  test dose of 0.25% Marcaine was injected into each respective transforaminal space.  The patient was observed for 90 seconds post injection.  After no sensory deficits were reported, and normal lower extremity motor function was noted,   the above injectate was administered so that equal amounts of the injectate were placed at each foramen (level) into  the transforaminal epidural space.   Additional Comments:  No complications occurred Dressing: 2 x 2 sterile gauze and Band-Aid    Post-procedure details: Patient was observed during the procedure. Post-procedure instructions were reviewed.  Patient left the clinic in stable condition.   Clinical History: MRI LUMBAR SPINE WITHOUT CONTRAST   TECHNIQUE: Multiplanar, multisequence MR imaging of the lumbar spine was performed. No intravenous contrast was administered.   COMPARISON:  X-ray lumbar 05/17/2021.   FINDINGS: Segmentation:  Standard.   Alignment: Minimal grade 1 anterolisthesis of L4 on L5 secondary to facet disease.   Vertebrae: No acute fracture, evidence of discitis, or aggressive bone lesion.   Conus medullaris and cauda equina: Conus extends to the T12 level. Conus and cauda equina appear normal.   Paraspinal and other soft tissues: No acute paraspinal abnormality. Prior left nephrectomy.   Disc levels:   Disc spaces: Degenerative disease with disc height loss at T11-12, L2-3, L3-4, L4-5 and L5-S1.   T12-L1: No significant disc bulge. No neural foraminal stenosis. No central canal stenosis.   L1-L2: No significant disc bulge. No neural foraminal stenosis. No central canal stenosis.   L2-L3: Broad-based disc bulge flattening ventral thecal sac. Moderate bilateral facet arthropathy with ligamentum flavum infolding. Moderate spinal stenosis. No foraminal stenosis.   L3-L4: Broad-based disc bulge flattening the ventral thecal sac. Moderate bilateral facet arthropathy with ligamentum flavum infolding. Moderate spinal stenosis.   L4-L5: Minimal broad-based disc bulge. Severe bilateral facet arthropathy. Severe spinal stenosis. No foraminal stenosis.   L5-S1: Mild broad-based disc osteophyte complex. No foraminal or central canal stenosis.   IMPRESSION: 1. At L4-5 there is a minimal broad-based disc bulge. Severe bilateral facet arthropathy. Severe  spinal stenosis. No foraminal stenosis. 2. Otherwise, lumbar spine spondylosis as described above. 3.  No acute osseous injury of the lumbar spine.     Electronically Signed   By: Elige Ko M.D.   On: 06/04/2021 09:39     Objective:  VS:  HT:     WT:    BMI:      BP:(!) 141/87   HR:98bpm   TEMP: ( )   RESP:  Physical Exam Vitals and nursing note reviewed.  Constitutional:      General: She is not in acute distress.    Appearance: Normal appearance. She is not ill-appearing.  HENT:     Head: Normocephalic and atraumatic.     Right Ear: External ear normal.     Left Ear: External ear normal.  Eyes:     Extraocular Movements: Extraocular movements intact.  Cardiovascular:     Rate and Rhythm: Normal rate.     Pulses: Normal pulses.  Pulmonary:     Effort: Pulmonary effort is normal. No respiratory distress.  Abdominal:     General: There is no distension.     Palpations: Abdomen is soft.  Musculoskeletal:        General: Tenderness present.     Cervical back: Neck supple.     Right lower leg: No edema.     Left lower leg: No edema.     Comments: Patient has good distal strength with no pain  over the greater trochanters.  No clonus or focal weakness.  Skin:    Findings: No erythema, lesion or rash.  Neurological:     General: No focal deficit present.     Mental Status: She is alert and oriented to person, place, and time.     Sensory: No sensory deficit.     Motor: No weakness or abnormal muscle tone.     Coordination: Coordination normal.  Psychiatric:        Mood and Affect: Mood normal.        Behavior: Behavior normal.     Imaging: No results found.

## 2021-07-10 ENCOUNTER — Encounter: Payer: Medicare PPO | Admitting: Physical Therapy

## 2021-07-10 ENCOUNTER — Other Ambulatory Visit: Payer: Self-pay | Admitting: Physician Assistant

## 2021-07-10 MED ORDER — HYDROCODONE-ACETAMINOPHEN 5-325 MG PO TABS: 1.0000 | ORAL_TABLET | Freq: Four times a day (QID) | ORAL | 0 refills | Status: DC | PRN

## 2021-07-16 ENCOUNTER — Other Ambulatory Visit: Payer: Self-pay | Admitting: Internal Medicine

## 2021-07-16 DIAGNOSIS — F339 Major depressive disorder, recurrent, unspecified: Secondary | ICD-10-CM

## 2021-07-16 DIAGNOSIS — I10 Essential (primary) hypertension: Secondary | ICD-10-CM

## 2021-07-17 ENCOUNTER — Other Ambulatory Visit: Payer: Self-pay | Admitting: Orthopaedic Surgery

## 2021-07-17 MED ORDER — HYDROCODONE-ACETAMINOPHEN 5-325 MG PO TABS: 1.0000 | ORAL_TABLET | Freq: Four times a day (QID) | ORAL | 0 refills | Status: DC | PRN

## 2021-07-24 ENCOUNTER — Other Ambulatory Visit: Payer: Self-pay | Admitting: Orthopaedic Surgery

## 2021-07-24 MED ORDER — HYDROCODONE-ACETAMINOPHEN 5-325 MG PO TABS: 1.0000 | ORAL_TABLET | Freq: Four times a day (QID) | ORAL | 0 refills | Status: DC | PRN

## 2021-07-25 ENCOUNTER — Encounter: Payer: Medicare PPO | Admitting: Rehabilitative and Restorative Service Providers"

## 2021-07-27 ENCOUNTER — Encounter: Payer: Self-pay | Admitting: Rehabilitative and Restorative Service Providers"

## 2021-07-27 ENCOUNTER — Ambulatory Visit: Payer: Medicare PPO | Admitting: Rehabilitative and Restorative Service Providers"

## 2021-07-27 ENCOUNTER — Other Ambulatory Visit: Payer: Self-pay

## 2021-07-27 DIAGNOSIS — R600 Localized edema: Secondary | ICD-10-CM

## 2021-07-27 DIAGNOSIS — M6281 Muscle weakness (generalized): Secondary | ICD-10-CM

## 2021-07-27 DIAGNOSIS — R262 Difficulty in walking, not elsewhere classified: Secondary | ICD-10-CM

## 2021-07-27 DIAGNOSIS — M25661 Stiffness of right knee, not elsewhere classified: Secondary | ICD-10-CM | POA: Diagnosis not present

## 2021-07-27 DIAGNOSIS — M25561 Pain in right knee: Secondary | ICD-10-CM

## 2021-07-27 NOTE — Patient Instructions (Signed)
Access Code: [email or web address] URL: [email or web address] Date: 07/27/2021 Prepared by: Pauletta Browns  Exercises Supine Quadricep Sets - 3-5 x daily - 7 x weekly - 1 sets - 10 reps - 5 second hold Standing Lumbar Extension at Wall - Forearms - 5 x daily - 7 x weekly - 1 sets - 5 reps - 3 seconds hold Standing Scapular Retraction - 5 x daily - 7 x weekly - 1 sets - 5 reps - 5 second hold Standing Hip Hiking - 5-10 x daily - 7 x weekly - 1 sets - 2-3 reps - 3 seconds hold Sit to Stand with Armchair - 2 x daily - 7 x weekly - 1 sets - 5 reps Bridge - 2 x daily - 7 x weekly - 2 sets - 10 reps - 5 seconds hold Tandem Stance - 2 x daily - 7 x weekly - 1 sets - 4-5 reps - 20 second hold

## 2021-07-27 NOTE — Therapy (Addendum)
[facility] Physical Therapy [address] [place], [place], [address] Phone: [phone]   Fax:  [phone]  Physical Therapy Treatment/Discharge  Patient Details  Name: Leslie [name] MRN: [id number] Date of Birth: 06-Oct-1950 Referring Provider (PT): Jean Rosenthal MD  PHYSICAL THERAPY DISCHARGE SUMMARY  Visits from Start of Care: 9  Current functional level related to goals / functional outcomes: See note   Remaining deficits: See note   Education / Equipment: HEP   Patient agrees to discharge. Patient goals were partially met. Patient is being discharged due to not returning since the last visit.   Encounter Date: 07/27/2021     Past Medical History:  Diagnosis Date   ADD (attention deficit disorder)    Allergy    Anemia    Arthritis 07/22/2014   osteoarthritis left knee   Colon polyps    Hypertension    Left knee pain    chronic   Narcotic addiction ([place])    Obesity    post gastric bypass   Vitamin D deficiency     Past Surgical History:  Procedure Laterality Date   arthscopic knee surgery Left    several times   BREAST BIOPSY     BREAST SURGERY  years ago   breast biopsy   CHOLECYSTECTOMY     EYE SURGERY Bilateral 2013   Toric Lens implants   EYE SURGERY Right 2014   corneal amniotic membrane   GASTRIC BYPASS  2003   KIDNEY DONATION  2004   TOTAL KNEE ARTHROPLASTY Left 06/16/2015   Procedure: LEFT TOTAL KNEE ARTHROPLASTY;  Surgeon: Mcarthur Rossetti, MD;  Location: [facility];  Service: Orthopedics;  Laterality: Left;   TOTAL KNEE ARTHROPLASTY Right 05/04/2021   Procedure: RIGHT TOTAL KNEE ARTHROPLASTY;  Surgeon: Mcarthur Rossetti, MD;  Location: [facility];  Service: Orthopedics;  Laterality: Right;    There were no vitals filed for this visit.                                     PT Short Term Goals - 07/27/21 1711       PT SHORT TERM GOAL #1   Title Improve R knee AROM  to -3 to 120 degrees.    Baseline 0-125    Status Achieved               PT Long Term Goals - 07/27/21 1712       PT LONG TERM GOAL #1   Title Improve FOTO to 60.    Baseline 52    Time 6    Period Weeks    Status Revised    Target Date 08/10/21      PT LONG TERM GOAL #2   Title Jimmie will report R knee pain consistently 0-3/10 on the Numeric Pain Rating Scale.    Baseline 0-4/10    Time 6    Period Weeks    Status Revised    Target Date 08/10/21      PT LONG TERM GOAL #3   Title Improve R knee extension AROM to 0 degrees.    Baseline 0 (was -6) degrees    Time 6    Period Weeks    Status Achieved    Target Date 08/10/21      PT LONG TERM GOAL #4   Title Alyna will be able to walk safely without a walker and be able to do  stairs for moving back home independently.    Baseline Uses a cane and is living with her family due to needing help with ADLs.    Time 6    Period Weeks    Status Revised    Target Date 08/10/21      PT LONG TERM GOAL #5   Title [place] will be independent with her long-term HEP at [place].    Baseline Updated today    Time 6    Status Revised    Target Date 08/10/21                     Patient will benefit from skilled therapeutic intervention in order to improve the following deficits and impairments:  Abnormal gait, Decreased activity tolerance, Decreased endurance, Decreased range of motion, Decreased strength, Difficulty walking, Increased edema, Impaired flexibility, Improper body mechanics, Postural dysfunction, Pain  Visit Diagnosis: Difficulty walking  Muscle weakness (generalized)  Stiffness of right knee, not elsewhere classified  Right knee pain, unspecified chronicity  Localized edema     Problem List Patient Active Problem List   Diagnosis Date Noted   Status post right knee replacement 05/04/2021   Vitamin B12 deficiency 03/14/2021   Macrocytosis without anemia 03/14/2021   Vitamin D deficiency  02/17/2020   Sleep disorder 07/10/2018   ADHD (attention deficit hyperactivity disorder) 10/01/2017   Unilateral primary osteoarthritis, right knee 08/20/2017   Chronic pain of both shoulders 08/20/2017   Complete tear of right rotator cuff 04/29/2017   Osteoarthritis of left knee 06/16/2015   Status post total left knee replacement 06/16/2015   Attention deficit disorder 09/14/2007   COLONIC POLYPS, HX OF 08/03/2007   Essential hypertension 01/20/2007   OBESITY NOS 12/04/2006   Allergic rhinitis 12/04/2006   GASTROJEJUNOSTOMY, HX OF 12/04/2006   BREAST BIOPSY, HX OF 12/04/2006    Farley Ly, PT, MPT 02/07/2022, 10:04 AM  [facility] Physical Therapy [address] [place], [place], [address] Phone: [phone]   Fax:  [phone]  Name: ILZE ROSELLI MRN: [id number] Date of Birth: 1950-12-31

## 2021-07-31 ENCOUNTER — Other Ambulatory Visit: Payer: Self-pay | Admitting: Orthopaedic Surgery

## 2021-07-31 MED ORDER — HYDROCODONE-ACETAMINOPHEN 5-325 MG PO TABS: 1.0000 | ORAL_TABLET | Freq: Four times a day (QID) | ORAL | 0 refills | Status: DC | PRN

## 2021-08-01 ENCOUNTER — Telehealth: Payer: Self-pay | Admitting: Rehabilitative and Restorative Service Providers"

## 2021-08-01 ENCOUNTER — Encounter: Payer: Medicare PPO | Admitting: Rehabilitative and Restorative Service Providers"

## 2021-08-01 NOTE — Telephone Encounter (Signed)
Said she has been trying to call but wasn't able to get through.  Reminded her of her 9:30 AM appointment Friday.

## 2021-08-03 ENCOUNTER — Encounter: Payer: Medicare PPO | Admitting: Rehabilitative and Restorative Service Providers"

## 2021-08-06 ENCOUNTER — Encounter: Payer: Self-pay | Admitting: Internal Medicine

## 2021-08-06 ENCOUNTER — Other Ambulatory Visit: Payer: Self-pay | Admitting: Orthopaedic Surgery

## 2021-08-06 DIAGNOSIS — F900 Attention-deficit hyperactivity disorder, predominantly inattentive type: Secondary | ICD-10-CM

## 2021-08-06 DIAGNOSIS — G479 Sleep disorder, unspecified: Secondary | ICD-10-CM

## 2021-08-06 MED ORDER — HYDROCODONE-ACETAMINOPHEN 5-325 MG PO TABS: 1.0000 | ORAL_TABLET | Freq: Three times a day (TID) | ORAL | 0 refills | Status: DC | PRN

## 2021-08-06 NOTE — Telephone Encounter (Signed)
Please advise 

## 2021-08-07 ENCOUNTER — Other Ambulatory Visit: Payer: Self-pay | Admitting: Orthopaedic Surgery

## 2021-08-07 NOTE — Telephone Encounter (Signed)
Spoke to pharmacy and they stated that they did short fill both Rx. They stated that the Rx has been on back order and it has always been an issue getting it in stock. Pharmacist stated that they will need a new Rx to fill.

## 2021-08-08 ENCOUNTER — Encounter: Payer: Medicare PPO | Admitting: Rehabilitative and Restorative Service Providers"

## 2021-08-08 MED ORDER — AMPHETAMINE-DEXTROAMPHETAMINE 20 MG PO TABS: ORAL_TABLET | ORAL | 0 refills | Status: DC

## 2021-08-08 MED ORDER — AMPHETAMINE-DEXTROAMPHET ER 30 MG PO CP24: 30.0000 mg | ORAL_CAPSULE | ORAL | 0 refills | Status: DC

## 2021-08-10 ENCOUNTER — Encounter: Payer: Medicare PPO | Admitting: Rehabilitative and Restorative Service Providers"

## 2021-08-14 MED ORDER — AMPHETAMINE-DEXTROAMPHET ER 30 MG PO CP24: 30.0000 mg | ORAL_CAPSULE | ORAL | 0 refills | Status: DC

## 2021-08-14 MED ORDER — AMPHETAMINE-DEXTROAMPHETAMINE 20 MG PO TABS: ORAL_TABLET | ORAL | 0 refills | Status: DC

## 2021-08-14 NOTE — Addendum Note (Signed)
Addended by: Claudette Laws D on: 08/14/2021 04:10 PM   Modules accepted: Orders

## 2021-08-14 NOTE — Telephone Encounter (Signed)
Pt called to get clarification on what she exactly needs.  Pt states she needs Adderall 30mg  10ea; Adderall 20mg  10ea.

## 2021-08-20 ENCOUNTER — Other Ambulatory Visit: Payer: Self-pay | Admitting: Orthopaedic Surgery

## 2021-08-20 MED ORDER — HYDROCODONE-ACETAMINOPHEN 5-325 MG PO TABS: 1.0000 | ORAL_TABLET | Freq: Three times a day (TID) | ORAL | 0 refills | Status: DC | PRN

## 2021-08-26 ENCOUNTER — Other Ambulatory Visit: Payer: Self-pay | Admitting: Orthopaedic Surgery

## 2021-09-07 ENCOUNTER — Telehealth: Payer: Self-pay | Admitting: *Deleted

## 2021-09-07 NOTE — Telephone Encounter (Signed)
Ortho bundle 90 day call completed. 

## 2021-09-19 ENCOUNTER — Other Ambulatory Visit: Payer: Self-pay | Admitting: Internal Medicine

## 2021-09-19 DIAGNOSIS — E559 Vitamin D deficiency, unspecified: Secondary | ICD-10-CM

## 2021-09-20 ENCOUNTER — Other Ambulatory Visit: Payer: Self-pay | Admitting: Internal Medicine

## 2021-09-20 DIAGNOSIS — G479 Sleep disorder, unspecified: Secondary | ICD-10-CM

## 2021-09-26 ENCOUNTER — Emergency Department (HOSPITAL_BASED_OUTPATIENT_CLINIC_OR_DEPARTMENT_OTHER)
Admission: EM | Admit: 2021-09-26 | Discharge: 2021-09-26 | Disposition: A | Discharge status: Home / Self Care | Payer: Medicare PPO | Attending: Emergency Medicine | Admitting: Emergency Medicine

## 2021-09-26 ENCOUNTER — Other Ambulatory Visit: Payer: Self-pay

## 2021-09-26 ENCOUNTER — Encounter (HOSPITAL_BASED_OUTPATIENT_CLINIC_OR_DEPARTMENT_OTHER): Payer: Self-pay | Admitting: Emergency Medicine

## 2021-09-26 ENCOUNTER — Emergency Department (HOSPITAL_BASED_OUTPATIENT_CLINIC_OR_DEPARTMENT_OTHER): Payer: Medicare PPO

## 2021-09-26 DIAGNOSIS — M7989 Other specified soft tissue disorders: Secondary | ICD-10-CM | POA: Diagnosis not present

## 2021-09-26 DIAGNOSIS — R600 Localized edema: Secondary | ICD-10-CM | POA: Diagnosis not present

## 2021-09-26 DIAGNOSIS — Z7982 Long term (current) use of aspirin: Secondary | ICD-10-CM | POA: Insufficient documentation

## 2021-09-26 DIAGNOSIS — R000 Tachycardia, unspecified: Secondary | ICD-10-CM | POA: Insufficient documentation

## 2021-09-26 DIAGNOSIS — M79661 Pain in right lower leg: Secondary | ICD-10-CM | POA: Diagnosis not present

## 2021-09-26 DIAGNOSIS — R2240 Localized swelling, mass and lump, unspecified lower limb: Secondary | ICD-10-CM | POA: Diagnosis not present

## 2021-09-26 DIAGNOSIS — I10 Essential (primary) hypertension: Secondary | ICD-10-CM | POA: Diagnosis not present

## 2021-09-26 DIAGNOSIS — Z79899 Other long term (current) drug therapy: Secondary | ICD-10-CM | POA: Insufficient documentation

## 2021-09-26 NOTE — Discharge Instructions (Signed)
Keep the leg elevated and use compression stockings  ? ?Please follow up with your primary care provider within 5-7 days for re-evaluation of your symptoms. If your symptoms persist then you will need to have a repeat ultrasound in 7-10 days.  ? ? Please return to the emergency department for any new or worsening symptoms. ? ?

## 2021-09-26 NOTE — ED Triage Notes (Addendum)
Right leg x 3-4 weeks ago  calf  has a knot she states ankle swells hx of knee replacement in oct same side ?

## 2021-09-26 NOTE — ED Notes (Signed)
Patient transported to US 

## 2021-09-26 NOTE — ED Provider Notes (Signed)
[facility] EMERGENCY DEPT Provider Note   CSN: [id number] Arrival date & time: 09/26/21  1203     History  Chief Complaint  Patient presents with   Leg Pain    Leslie [name] is a 71 y.o. female.  HPI  71 year old female with a history of arthritis, ADD, allergies, anemia, hypertension, left knee pain, narcotic use, obesity, who presents to the emergency department today for evaluation of right lower extremity swelling.  She states this has been ongoing for several weeks.  She does note she had a knee replacement in October 2022.  She denies any significant pain to the leg.  She had no fevers or chills.  She denies any chest pain, shortness of breath, cough, hemoptysis.  Home Medications Prior to Admission medications   Medication Sig Start Date End Date Taking? Authorizing Provider  ALPRAZolam (XANAX) 0.25 MG tablet TAKE 1 TABLET(0.25 MG) BY MOUTH AT BEDTIME AS NEEDED FOR ANXIETY 06/28/21   Philip Aspen, Limmie Patricia, MD  amphetamine-dextroamphetamine (ADDERALL XR) 30 MG 24 hr capsule Take 1 capsule (30 mg total) by mouth every morning. 08/14/21   Henderson Cloud, MD  amphetamine-dextroamphetamine (ADDERALL) 20 MG tablet Take 1/2 to 1 tablet by mouth every evening if needed. 08/14/21   Philip Aspen, Limmie Patricia, MD  aspirin 81 MG chewable tablet Chew 1 tablet (81 mg total) by mouth 2 (two) times daily. 05/05/21   Kathryne Hitch, MD  FLUoxetine (PROZAC) 40 MG capsule TAKE 1 CAPSULE(40 MG) BY MOUTH EVERY MORNING 07/17/21   Philip Aspen, Limmie Patricia, MD  gabapentin (NEURONTIN) 100 MG capsule TAKE 1 CAPSULE(100 MG) BY MOUTH THREE TIMES DAILY 08/07/21   Kathryne Hitch, MD  hydrochlorothiazide (HYDRODIURIL) 25 MG tablet TAKE 1 TABLET(25 MG) BY MOUTH DAILY 07/17/21   Philip Aspen, Limmie Patricia, MD  HYDROcodone-acetaminophen (NORCO) 5-325 MG tablet Take 1 tablet by mouth 3 (three) times daily as needed for moderate pain. 08/20/21   Kathryne Hitch, MD  lamoTRIgine (LAMICTAL) 100 MG tablet TAKE 1 TABLET(100 MG) BY MOUTH TWICE DAILY 04/17/21   Philip Aspen, Limmie Patricia, MD  tiZANidine (ZANAFLEX) 2 MG tablet TAKE 1 TABLET(2 MG) BY MOUTH THREE TIMES DAILY 08/27/21   Kathryne Hitch, MD  Vitamin D, Ergocalciferol, (DRISDOL) 1.25 MG (50000 UNIT) CAPS capsule TAKE 1 CAPSULE BY MOUTH EVERY 7 DAYS FOR 12 DOSES 06/20/21   Philip Aspen, Limmie Patricia, MD  zolpidem (AMBIEN) 10 MG tablet TAKE 1 TABLET(10 MG) BY MOUTH AT BEDTIME 09/24/21   Philip Aspen, Limmie Patricia, MD      Allergies    Amoxicillin and Sulfamethoxazole    Review of Systems   Review of Systems See HPI for pertinent positives or negatives.   Physical Exam Updated Vital Signs BP (!) 173/92 (BP Location: Right Arm)    Pulse (!) 108    Temp 97.7 F (36.5 C) (Oral)    Resp 18    Ht 5\' 5"  (1.651 m)    Wt 91.6 kg    SpO2 99%    BMI 33.61 kg/m  Physical Exam Vitals and nursing note reviewed.  Constitutional:      General: She is not in acute distress.    Appearance: She is well-developed.  HENT:     Head: Normocephalic and atraumatic.  Eyes:     Conjunctiva/sclera: Conjunctivae normal.  Cardiovascular:     Rate and Rhythm: Regular rhythm. Tachycardia present.     Heart sounds: No murmur heard. Pulmonary:  Effort: Pulmonary effort is normal. No respiratory distress.     Breath sounds: Normal breath sounds.  Abdominal:     Palpations: Abdomen is soft.     Tenderness: There is no abdominal tenderness.  Musculoskeletal:     Cervical back: Neck supple.     Comments: 1+ edema to the RLE. No erythema, warmth or fluctuance. Dp pulses 2+ and symmetric. Compartments are soft  Skin:    General: Skin is warm and dry.     Capillary Refill: Capillary refill takes less than 2 seconds.  Neurological:     Mental Status: She is alert.  Psychiatric:     Comments: anxious    ED Results / Procedures / Treatments   Labs (all labs ordered are listed, but only  abnormal results are displayed) Labs Reviewed - No data to display  EKG None  Radiology US Venous Img Lower Unilateral Right  Result Date: 09/26/2021 CLINICAL DATA:  Right lower extremity pain for 4 weeks EXAM: RIGHT LOWER EXTREMITY VENOUS DOPPLER ULTRASOUND TECHNIQUE: Gray-scale sonography with graded compression, as well as color Doppler and duplex ultrasound were performed to evaluate the lower extremity deep venous systems from the level of the common femoral vein and including the common femoral, femoral, profunda femoral, popliteal and calf veins including the posterior tibial, peroneal and gastrocnemius veins when visible. The superficial great saphenous vein was also interrogated. Spectral Doppler was utilized to evaluate flow at rest and with distal augmentation maneuvers in the common femoral, femoral and popliteal veins. COMPARISON:  None. FINDINGS: Contralateral Common Femoral Vein: Respiratory phasicity is normal and symmetric with the symptomatic side. No evidence of thrombus. Normal compressibility. Common Femoral Vein: No evidence of thrombus. Normal compressibility, respiratory phasicity and response to augmentation. Saphenofemoral Junction: No evidence of thrombus. Normal compressibility and flow on color Doppler imaging. Profunda Femoral Vein: No evidence of thrombus. Normal compressibility and flow on color Doppler imaging. Femoral Vein: No evidence of thrombus. Normal compressibility, respiratory phasicity and response to augmentation. Popliteal Vein: No evidence of thrombus. Normal compressibility, respiratory phasicity and response to augmentation. Calf Veins: No evidence of thrombus. Normal compressibility and flow on color Doppler imaging. IMPRESSION: No evidence of deep venous thrombosis. Electronically Signed   By: Jerilynn Mages.  Shick M.D.   On: 09/26/2021 13:13    Procedures Procedures    Medications Ordered in ED Medications - No data to display  ED Course/ Medical Decision  Making/ A&P                           Medical Decision Making Amount and/or Complexity of Data Reviewed ECG/medicine tests: ordered.   This patient presents to the ED for concern of leg swelling, this involves an extensive number of treatment options, and is a complaint that carries with it a high risk of complications and morbidity.  The differential diagnosis includes but is not limited to dvt, cellulitis, venous stasis, abscess, compartment syndrome   Comorbidities that complicate the patient evaluation: Patients presentation is complicated by their history of recent orthopedic surgery  Additional history obtained: Additional history obtained from family Records reviewed Care Everywhere/External Records  Imaging Studies ordered: I ordered, independently visualized, and interpreted imaging which showed  RLE [place] -  No evidence of deep venous thrombosis.   I agree with the radiologist interpretation  Complexity of problems addressed: Patients presentation is most consistent with  acute illness/injury with systemic symptoms  Disposition: After consideration of the diagnostic results and the  patients response to treatment,  I feel that the patent would benefit from discharge   .  Patient's work-up here is reassuring.  Your ultrasound not show any evidence of a DVT.  Her exam is not consistent with cellulitis.  We did discuss starting antibiotics for possible early cellulitis out of an abundance of caution however the patient declined at this time which I do think is reasonable.  No evidence of deep space infection.  Compartments are soft.  Low suspicion for other emergent cause of symptoms at this time.  Patient was initially noted to be tachycardic however did note that she is nervous to be in hospitals.  Her heart rate did improve throughout her ED stay and normalized at the time of my last evaluation of her.  Heart rate was 99.  Advised on follow-up with her PCP within the next week  and advised on return precautions.  Pt voices understanding of the plan and reasons to return. All questions answered. Pt stable for discharge.    Final Clinical Impression(s) / ED Diagnoses Final diagnoses:  Leg swelling    Rx / DC Orders ED Discharge Orders     None         Rodney Booze, PA-C 09/26/21 1405    Sherwood Gambler, MD 09/26/21 1453

## 2021-09-30 ENCOUNTER — Other Ambulatory Visit: Payer: Self-pay | Admitting: Orthopaedic Surgery

## 2021-09-30 ENCOUNTER — Other Ambulatory Visit: Payer: Self-pay | Admitting: Internal Medicine

## 2021-09-30 DIAGNOSIS — G479 Sleep disorder, unspecified: Secondary | ICD-10-CM

## 2021-10-08 ENCOUNTER — Ambulatory Visit: Payer: Medicare PPO | Admitting: Internal Medicine

## 2021-10-09 ENCOUNTER — Encounter: Payer: Self-pay | Admitting: Internal Medicine

## 2021-10-09 ENCOUNTER — Ambulatory Visit: Payer: Medicare PPO | Admitting: Internal Medicine

## 2021-10-09 VITALS — BP 120/80 | HR 76 | Temp 97.6°F | Ht 64.0 in | Wt 208.6 lb

## 2021-10-09 DIAGNOSIS — I872 Venous insufficiency (chronic) (peripheral): Secondary | ICD-10-CM | POA: Diagnosis not present

## 2021-10-09 DIAGNOSIS — G479 Sleep disorder, unspecified: Secondary | ICD-10-CM | POA: Diagnosis not present

## 2021-10-09 DIAGNOSIS — F339 Major depressive disorder, recurrent, unspecified: Secondary | ICD-10-CM

## 2021-10-09 DIAGNOSIS — Z1211 Encounter for screening for malignant neoplasm of colon: Secondary | ICD-10-CM

## 2021-10-09 DIAGNOSIS — F900 Attention-deficit hyperactivity disorder, predominantly inattentive type: Secondary | ICD-10-CM | POA: Diagnosis not present

## 2021-10-09 MED ORDER — AMPHETAMINE-DEXTROAMPHET ER 30 MG PO CP24
30.0000 mg | ORAL_CAPSULE | ORAL | 0 refills | Status: DC
Start: 2021-10-09 — End: 2022-01-24

## 2021-10-09 MED ORDER — LAMOTRIGINE 100 MG PO TABS: 100.0000 mg | ORAL_TABLET | Freq: Two times a day (BID) | ORAL | 1 refills | Status: DC

## 2021-10-09 MED ORDER — ZOLPIDEM TARTRATE 10 MG PO TABS: ORAL_TABLET | ORAL | 1 refills | Status: DC

## 2021-10-09 MED ORDER — AMPHETAMINE-DEXTROAMPHET ER 30 MG PO CP24: 30.0000 mg | ORAL_CAPSULE | ORAL | 0 refills | Status: DC

## 2021-10-09 MED ORDER — AMPHETAMINE-DEXTROAMPHETAMINE 20 MG PO TABS: 20.0000 mg | ORAL_TABLET | Freq: Every day | ORAL | 0 refills | Status: DC

## 2021-10-09 NOTE — Progress Notes (Signed)
? ? ? ?Established Patient Office Visit ? ? ? ? ?This visit occurred during the SARS-CoV-2 public health emergency.  Safety protocols were in place, including screening questions prior to the visit, additional usage of staff PPE, and extensive cleaning of exam room while observing appropriate contact time as indicated for disinfecting solutions.  ? ? ?CC/Reason for Visit: Leg swelling, medication refills ? ?HPI: Leslie [name] is a 71 y.o. female who is coming in today for the above mentioned reasons.  She is noted to have chronic venous insufficiency, she has not been wearing stockings.  On the eighth she visited urgent care for what she thought was right greater than left swelling, lower extremity Doppler was negative for DVT.  She has not noticed any redness.  She is due for medication refills for Adderall and Ambien as well as alprazolam. ? ?Past Medical/Surgical History: ?Past Medical History:  ?Diagnosis Date  ? ADD (attention deficit disorder)   ? Allergy   ? Anemia   ? Arthritis 07/22/2014  ? osteoarthritis left knee  ? Colon polyps   ? Hypertension   ? Left knee pain   ? chronic  ? Narcotic addiction ([place])   ? Obesity   ? post gastric bypass  ? Vitamin D deficiency   ? ? ?Past Surgical History:  ?Procedure Laterality Date  ? arthscopic knee surgery Left   ? several times  ? BREAST BIOPSY    ? BREAST SURGERY  years ago  ? breast biopsy  ? CHOLECYSTECTOMY    ? EYE SURGERY Bilateral 2013  ? Toric Lens implants  ? EYE SURGERY Right 2014  ? corneal amniotic membrane  ? GASTRIC BYPASS  2003  ? KIDNEY DONATION  2004  ? TOTAL KNEE ARTHROPLASTY Left 06/16/2015  ? Procedure: LEFT TOTAL KNEE ARTHROPLASTY;  Surgeon: Mcarthur Rossetti, MD;  Location: [facility];  Service: Orthopedics;  Laterality: Left;  ? TOTAL KNEE ARTHROPLASTY Right 05/04/2021  ? Procedure: RIGHT TOTAL KNEE ARTHROPLASTY;  Surgeon: Mcarthur Rossetti, MD;  Location: [facility];  Service: Orthopedics;  Laterality: Right;  ? ? ?Social History: ?  reports that she quit smoking about 29 years ago. Her smoking use included cigarettes. She has never used smokeless tobacco. She reports current alcohol use. She reports that she does not use drugs. ? ?Allergies: ?Allergies  ?Allergen Reactions  ? Amoxicillin   ?  Hives  ? Sulfamethoxazole Nausea And Vomiting  ?  See patient list of med intolerances due to h/o gastric bypass and nephrectomy  ? ? ?Family History:  ?Family History  ?Problem Relation Age of Onset  ? Heart disease Mother   ?     post CABG history of CHF  ? COPD Father   ? Diabetes Sister   ? Hypertension Sister   ?     post renal transplant  ? Heart disease Brother   ?     CAD  ? ? ? ?Current Outpatient Medications:  ?  amphetamine-dextroamphetamine (ADDERALL XR) 30 MG 24 hr capsule, Take 1 capsule (30 mg total) by mouth every morning., Disp: 30 capsule, Rfl: 0 ?  amphetamine-dextroamphetamine (ADDERALL XR) 30 MG 24 hr capsule, Take 1 capsule (30 mg total) by mouth every morning., Disp: 30 capsule, Rfl: 0 ?  amphetamine-dextroamphetamine (ADDERALL XR) 30 MG 24 hr capsule, Take 1 capsule (30 mg total) by mouth every morning., Disp: 30 capsule, Rfl: 0 ?  amphetamine-dextroamphetamine (ADDERALL) 20 MG tablet, Take 1 tablet (20 mg total) by mouth daily., Disp:  30 tablet, Rfl: 0 ?  amphetamine-dextroamphetamine (ADDERALL) 20 MG tablet, Take 1 tablet (20 mg total) by mouth daily., Disp: 30 tablet, Rfl: 0 ?  amphetamine-dextroamphetamine (ADDERALL) 20 MG tablet, Take 1 tablet (20 mg total) by mouth daily., Disp: 30 tablet, Rfl: 0 ?  ALPRAZolam (XANAX) 0.25 MG tablet, TAKE 1 TABLET(0.25 MG) BY MOUTH AT BEDTIME AS NEEDED FOR ANXIETY, Disp: 30 tablet, Rfl: 1 ?  amphetamine-dextroamphetamine (ADDERALL XR) 30 MG 24 hr capsule, Take 1 capsule (30 mg total) by mouth every morning., Disp: 10 capsule, Rfl: 0 ?  amphetamine-dextroamphetamine (ADDERALL) 20 MG tablet, Take 1/2 to 1 tablet by mouth every evening if needed., Disp: 10 tablet, Rfl: 0 ?  FLUoxetine (PROZAC) 40  MG capsule, TAKE 1 CAPSULE(40 MG) BY MOUTH EVERY MORNING, Disp: 90 capsule, Rfl: 1 ?  gabapentin (NEURONTIN) 100 MG capsule, TAKE 1 CAPSULE(100 MG) BY MOUTH THREE TIMES DAILY, Disp: 60 capsule, Rfl: 1 ?  hydrochlorothiazide (HYDRODIURIL) 25 MG tablet, TAKE 1 TABLET(25 MG) BY MOUTH DAILY, Disp: 90 tablet, Rfl: 1 ?  lamoTRIgine (LAMICTAL) 100 MG tablet, Take 1 tablet (100 mg total) by mouth 2 (two) times daily., Disp: 180 tablet, Rfl: 1 ?  tiZANidine (ZANAFLEX) 2 MG tablet, TAKE 1 TABLET(2 MG) BY MOUTH THREE TIMES DAILY, Disp: 90 tablet, Rfl: 0 ?  zolpidem (AMBIEN) 10 MG tablet, TAKE 1 TABLET(10 MG) BY MOUTH AT BEDTIME, Disp: 30 tablet, Rfl: 1 ? ?Review of Systems:  ?Constitutional: Denies fever, chills, diaphoresis, appetite change and fatigue.  ?HEENT: Denies photophobia, eye pain, redness, hearing loss, ear pain, congestion, sore throat, rhinorrhea, sneezing, mouth sores, trouble swallowing, neck pain, neck stiffness and tinnitus.   ?Respiratory: Denies SOB, DOE, cough, chest tightness,  and wheezing.   ?Cardiovascular: Denies chest pain, palpitations.  ?Gastrointestinal: Denies nausea, vomiting, abdominal pain, diarrhea, constipation, blood in stool and abdominal distention.  ?Genitourinary: Denies dysuria, urgency, frequency, hematuria, flank pain and difficulty urinating.  ?Endocrine: Denies: hot or cold intolerance, sweats, changes in hair or nails, polyuria, polydipsia. ?Musculoskeletal: Denies myalgias, back pain, joint swelling, arthralgias and gait problem.  ?Skin: Denies pallor, rash and wound.  ?Neurological: Denies dizziness, seizures, syncope, weakness, light-headedness, numbness and headaches.  ?Hematological: Denies adenopathy. Easy bruising, personal or family bleeding history  ?Psychiatric/Behavioral: Denies suicidal ideation, mood changes, confusion, nervousness, sleep disturbance and agitation ? ? ? ?Physical Exam: ?Vitals:  ? 10/09/21 1007  ?BP: 120/80  ?Pulse: 76  ?Temp: 97.6 ?F (36.4 ?C)   ?TempSrc: Oral  ?SpO2: 97%  ?Weight: 208 lb 9.6 oz (94.6 kg)  ? ? ?Body mass index is 34.71 kg/m?. ? ? ?Constitutional: NAD, calm, comfortable ?Eyes: PERRL, lids and conjunctivae normal, wears corrective lenses ?ENMT: Mucous membranes are moist.  ?Respiratory: clear to auscultation bilaterally, no wheezing, no crackles. Normal respiratory effort. No accessory muscle use.  ?Cardiovascular: Regular rate and rhythm, no murmurs / rubs / gallops.  2+ pitting edema bilaterally up to upper legs. ?Neurologic: Grossly intact and nonfocal ?Psychiatric: Normal judgment and insight. Alert and oriented x 3. Normal mood.  ? ? ?Impression and Plan: ? ?Chronic venous insufficiency ?-She has bilateral, symmetric pitting edema.  Have advised use of compression stockings and leg elevation, recent negative lower extremity Dopplers reassuring. ? ?Depression, recurrent ([place]) ? - Plan: lamoTRIgine (LAMICTAL) 100 MG tablet ? ?Sleep disorder  ?- Plan: zolpidem (AMBIEN) 10 MG tablet ? ?Screening for malignant neoplasm of colon ? - Plan: Ambulatory referral to Gastroenterology ? ?Attention deficit hyperactivity disorder (ADHD), predominantly inattentive type  ?-PDMP reviewed,  overdose risk score is 280, she has 1 red flag for greater than 5 providers in the last 2 years although all recent prescriptions have been by me. ?-Refill Adderall XR 30 mg to take 1 tablet daily for total of 30 tablets a month x3 months, also refill Adderall 20 mg to take 1 tablet in the early afternoon for total of 30 tablets a month x3 months. ? ? ? ? ?Lelon Frohlich, MD ?[facility] ? ? ?

## 2021-10-23 ENCOUNTER — Other Ambulatory Visit: Payer: Self-pay | Admitting: Physician Assistant

## 2021-11-20 ENCOUNTER — Other Ambulatory Visit: Payer: Self-pay | Admitting: Physician Assistant

## 2021-11-21 ENCOUNTER — Encounter: Payer: Self-pay | Admitting: Internal Medicine

## 2021-11-21 DIAGNOSIS — F900 Attention-deficit hyperactivity disorder, predominantly inattentive type: Secondary | ICD-10-CM

## 2021-11-22 MED ORDER — AMPHETAMINE-DEXTROAMPHETAMINE 20 MG PO TABS: 20.0000 mg | ORAL_TABLET | Freq: Every day | ORAL | 0 refills | Status: DC

## 2021-12-10 DIAGNOSIS — H5203 Hypermetropia, bilateral: Secondary | ICD-10-CM | POA: Diagnosis not present

## 2021-12-15 ENCOUNTER — Other Ambulatory Visit: Payer: Self-pay | Admitting: Physician Assistant

## 2022-01-12 ENCOUNTER — Other Ambulatory Visit: Payer: Self-pay | Admitting: Physician Assistant

## 2022-01-12 ENCOUNTER — Other Ambulatory Visit: Payer: Self-pay | Admitting: Internal Medicine

## 2022-01-12 DIAGNOSIS — G479 Sleep disorder, unspecified: Secondary | ICD-10-CM

## 2022-01-13 ENCOUNTER — Encounter: Payer: Self-pay | Admitting: Physical Medicine and Rehabilitation

## 2022-01-15 ENCOUNTER — Ambulatory Visit: Payer: Medicare PPO | Admitting: Internal Medicine

## 2022-01-21 ENCOUNTER — Other Ambulatory Visit: Payer: Self-pay | Admitting: Internal Medicine

## 2022-01-21 DIAGNOSIS — I10 Essential (primary) hypertension: Secondary | ICD-10-CM

## 2022-01-21 DIAGNOSIS — F339 Major depressive disorder, recurrent, unspecified: Secondary | ICD-10-CM

## 2022-01-24 ENCOUNTER — Ambulatory Visit: Payer: Medicare PPO | Admitting: Internal Medicine

## 2022-01-24 ENCOUNTER — Encounter: Payer: Self-pay | Admitting: Internal Medicine

## 2022-01-24 VITALS — BP 118/58 | HR 67 | Temp 97.9°F | Ht 64.0 in | Wt 205.4 lb

## 2022-01-24 DIAGNOSIS — G479 Sleep disorder, unspecified: Secondary | ICD-10-CM | POA: Diagnosis not present

## 2022-01-24 DIAGNOSIS — F339 Major depressive disorder, recurrent, unspecified: Secondary | ICD-10-CM

## 2022-01-24 DIAGNOSIS — S41112A Laceration without foreign body of left upper arm, initial encounter: Secondary | ICD-10-CM | POA: Diagnosis not present

## 2022-01-24 DIAGNOSIS — F900 Attention-deficit hyperactivity disorder, predominantly inattentive type: Secondary | ICD-10-CM

## 2022-01-24 MED ORDER — AMPHETAMINE-DEXTROAMPHET ER 30 MG PO CP24: 30.0000 mg | ORAL_CAPSULE | ORAL | 0 refills | Status: DC

## 2022-01-24 MED ORDER — AMPHETAMINE-DEXTROAMPHETAMINE 20 MG PO TABS: 20.0000 mg | ORAL_TABLET | Freq: Every day | ORAL | 0 refills | Status: DC

## 2022-01-24 MED ORDER — ZOLPIDEM TARTRATE 10 MG PO TABS: ORAL_TABLET | ORAL | 1 refills | Status: DC

## 2022-01-24 MED ORDER — ALPRAZOLAM 0.25 MG PO TABS: 0.2500 mg | ORAL_TABLET | Freq: Every evening | ORAL | 0 refills | Status: DC | PRN

## 2022-01-24 MED ORDER — BUPROPION HCL ER (XL) 150 MG PO TB24: 150.0000 mg | ORAL_TABLET | Freq: Every day | ORAL | 1 refills | Status: DC

## 2022-01-24 NOTE — Progress Notes (Signed)
Established Patient Office Visit     CC/Reason for Visit: Medication refills, fall with skin tear  HPI: Leslie [name] is a 71 y.o. female who is coming in today for the above mentioned reasons.  She fell yesterday at her [place] while inspecting a water leak.  She tripped over a mound of rolled up carpet.  She did not lose consciousness.  She struck her left arm causing significant skin tears and bleeding.  She did not visit urgent care.  She wrapped it at home with Steri-Strips, gauze and Coban.  She is still dealing with significant depression that stems from the death of her spouse about 18 months ago.  She is not attending CBT.  She is currently on Prozac 40 mg and Lamictal 100 mg.  She is wondering if we can increase her medical management of depression.  She is also due for refills of her Adderall for her ADHD as well as Ambien for insomnia and alprazolam for anxiety.  Past Medical/Surgical History: Past Medical History:  Diagnosis Date   ADD (attention deficit disorder)    Allergy    Anemia    Arthritis 07/22/2014   osteoarthritis left knee   Colon polyps    Hypertension    Left knee pain    chronic   Narcotic addiction ([place])    Obesity    post gastric bypass   Vitamin D deficiency     Past Surgical History:  Procedure Laterality Date   arthscopic knee surgery Left    several times   BREAST BIOPSY     BREAST SURGERY  years ago   breast biopsy   CHOLECYSTECTOMY     EYE SURGERY Bilateral 2013   Toric Lens implants   EYE SURGERY Right 2014   corneal amniotic membrane   GASTRIC BYPASS  2003   KIDNEY DONATION  2004   TOTAL KNEE ARTHROPLASTY Left 06/16/2015   Procedure: LEFT TOTAL KNEE ARTHROPLASTY;  Surgeon: Mcarthur Rossetti, MD;  Location: [facility];  Service: Orthopedics;  Laterality: Left;   TOTAL KNEE ARTHROPLASTY Right 05/04/2021   Procedure: RIGHT TOTAL KNEE ARTHROPLASTY;  Surgeon: Mcarthur Rossetti, MD;  Location: [facility];  Service:  Orthopedics;  Laterality: Right;    Social History:  reports that she quit smoking about 29 years ago. Her smoking use included cigarettes. She has never used smokeless tobacco. She reports current alcohol use. She reports that she does not use drugs.  Allergies: Allergies  Allergen Reactions   Amoxicillin     Hives   Sulfamethoxazole Nausea And Vomiting    See patient list of med intolerances due to h/o gastric bypass and nephrectomy    Family History:  Family History  Problem Relation Age of Onset   Heart disease Mother        post CABG history of CHF   COPD Father    Diabetes Sister    Hypertension Sister        post renal transplant   Heart disease Brother        CAD     Current Outpatient Medications:    amphetamine-dextroamphetamine (ADDERALL XR) 30 MG 24 hr capsule, Take 1 capsule (30 mg total) by mouth every morning., Disp: 10 capsule, Rfl: 0   amphetamine-dextroamphetamine (ADDERALL) 20 MG tablet, Take 1/2 to 1 tablet by mouth every evening if needed., Disp: 10 tablet, Rfl: 0   buPROPion (WELLBUTRIN XL) 150 MG 24 hr tablet, Take 1 tablet (150 mg total) by  mouth daily., Disp: 90 tablet, Rfl: 1   FLUoxetine (PROZAC) 40 MG capsule, TAKE 1 CAPSULE(40 MG) BY MOUTH EVERY MORNING, Disp: 90 capsule, Rfl: 0   gabapentin (NEURONTIN) 100 MG capsule, TAKE 1 CAPSULE(100 MG) BY MOUTH THREE TIMES DAILY, Disp: 60 capsule, Rfl: 1   hydrochlorothiazide (HYDRODIURIL) 25 MG tablet, TAKE 1 TABLET(25 MG) BY MOUTH DAILY, Disp: 90 tablet, Rfl: 0   lamoTRIgine (LAMICTAL) 100 MG tablet, Take 1 tablet (100 mg total) by mouth 2 (two) times daily., Disp: 180 tablet, Rfl: 1   tiZANidine (ZANAFLEX) 2 MG tablet, TAKE 1 TABLET(2 MG) BY MOUTH THREE TIMES DAILY, Disp: 90 tablet, Rfl: 0   ALPRAZolam (XANAX) 0.25 MG tablet, Take 1 tablet (0.25 mg total) by mouth at bedtime as needed for anxiety., Disp: 30 tablet, Rfl: 0   amphetamine-dextroamphetamine (ADDERALL XR) 30 MG 24 hr capsule, Take 1 capsule (30  mg total) by mouth every morning., Disp: 30 capsule, Rfl: 0   amphetamine-dextroamphetamine (ADDERALL XR) 30 MG 24 hr capsule, Take 1 capsule (30 mg total) by mouth every morning., Disp: 30 capsule, Rfl: 0   amphetamine-dextroamphetamine (ADDERALL XR) 30 MG 24 hr capsule, Take 1 capsule (30 mg total) by mouth every morning., Disp: 30 capsule, Rfl: 0   amphetamine-dextroamphetamine (ADDERALL) 20 MG tablet, Take 1 tablet (20 mg total) by mouth daily., Disp: 30 tablet, Rfl: 0   amphetamine-dextroamphetamine (ADDERALL) 20 MG tablet, Take 1 tablet (20 mg total) by mouth daily., Disp: 30 tablet, Rfl: 0   amphetamine-dextroamphetamine (ADDERALL) 20 MG tablet, Take 1 tablet (20 mg total) by mouth daily., Disp: 30 tablet, Rfl: 0   zolpidem (AMBIEN) 10 MG tablet, TAKE 1 TABLET(10 MG) BY MOUTH AT BEDTIME, Disp: 30 tablet, Rfl: 1  Review of Systems:  Constitutional: Denies fever, chills, diaphoresis, appetite change and fatigue.  HEENT: Denies photophobia, eye pain, redness, hearing loss, ear pain, congestion, sore throat, rhinorrhea, sneezing, mouth sores, trouble swallowing, neck pain, neck stiffness and tinnitus.   Respiratory: Denies SOB, DOE, cough, chest tightness,  and wheezing.   Cardiovascular: Denies chest pain, palpitations and leg swelling.  Gastrointestinal: Denies nausea, vomiting, abdominal pain, diarrhea, constipation, blood in stool and abdominal distention.  Genitourinary: Denies dysuria, urgency, frequency, hematuria, flank pain and difficulty urinating.  Endocrine: Denies: hot or cold intolerance, sweats, changes in hair or nails, polyuria, polydipsia. Musculoskeletal: Denies myalgias, back pain, joint swelling, arthralgias and gait problem.  Skin: Denies pallor, rash and wound.  Neurological: Denies dizziness, seizures, syncope, weakness, light-headedness, numbness and headaches.  Hematological: Denies adenopathy. Easy bruising, personal or family bleeding history   Psychiatric/Behavioral: Denies suicidal ideation,  confusion, nervousness and agitation    Physical Exam: Vitals:   01/24/22 1109  BP: (!) 118/58  Pulse: 67  Temp: 97.9 F (36.6 C)  TempSrc: Oral  SpO2: 98%  Weight: 205 lb 6.4 oz (93.2 kg)  Height: 5\' 4"  (1.626 m)    Body mass index is 35.26 kg/m.   Constitutional: NAD, calm, comfortable Eyes: PERRL, lids and conjunctivae normal, wears corrective lenses ENMT: Mucous membranes are moist.  Skin: Significant skin tears to her left arm Psychiatric: Normal judgment and insight. Alert and oriented x 3.  Mood is depressed and tearful   Impression and Plan:  Skin tear of left upper arm without complication, initial encounter -Old dressing removed, the wound has been irrigated, Steri-Strips have been applied to help keep wound edges approximated while healing, there is no laceration that requires repair, covered with clean gauze and Coban.  Depression,  recurrent (HCC) -Continue Lamictal and Prozac, add Wellbutrin, have given her information on how to schedule CBT sessions.  Attention deficit hyperactivity disorder (ADHD), predominantly inattentive type -PDMP reviewed, overdose risk score is 240, she has a red flag for greater than 5 providers, however all recent prescriptions have been by myself. -Refill Adderall XR 30 mg to take 1 tablet daily for total of 30 tablets a month x3 months, also refill Adderall 20 mg of which she takes 1 tablet in the afternoon for total of 30 tablets a month x3 months.  Sleep disorder  - Plan: zolpidem (AMBIEN) 10 MG tablet, ALPRAZolam (XANAX) 0.25 MG tablet    Time spent:32 minutes reviewing chart, interviewing and examining patient and formulating plan of care.    Chaya Jan, MD [facility]

## 2022-02-04 ENCOUNTER — Other Ambulatory Visit: Payer: Self-pay | Admitting: Orthopaedic Surgery

## 2022-02-07 ENCOUNTER — Ambulatory Visit (INDEPENDENT_AMBULATORY_CARE_PROVIDER_SITE_OTHER): Payer: Medicare PPO | Admitting: Physical Medicine and Rehabilitation

## 2022-02-07 ENCOUNTER — Ambulatory Visit: Payer: Self-pay

## 2022-02-07 ENCOUNTER — Encounter: Payer: Self-pay | Admitting: Physical Medicine and Rehabilitation

## 2022-02-07 VITALS — BP 153/86 | HR 103

## 2022-02-07 DIAGNOSIS — M5416 Radiculopathy, lumbar region: Secondary | ICD-10-CM | POA: Diagnosis not present

## 2022-02-07 MED ORDER — METHYLPREDNISOLONE ACETATE 80 MG/ML IJ SUSP
80.0000 mg | Freq: Once | INTRAMUSCULAR | Status: AC
  Administered 2022-02-07: 80 mg

## 2022-02-07 NOTE — Patient Instructions (Signed)

## 2022-02-07 NOTE — Progress Notes (Signed)
Pt state lower back pain that travels down both leg and foot. Pt state walking, standing and laying down makes the pain worse. Pt state she takes over the counter pain meds to help ease her pain.  Numeric Pain Rating Scale and Functional Assessment Average Pain 7   In the last MONTH (on 0-10 scale) has pain interfered with the following?  1. General activity like being  able to carry out your everyday physical activities such as walking, climbing stairs, carrying groceries, or moving a chair?  Rating(10)   +Driver, -BT, -Dye Allergies.

## 2022-02-09 ENCOUNTER — Other Ambulatory Visit: Payer: Self-pay | Admitting: Physician Assistant

## 2022-02-18 NOTE — Procedures (Signed)
Lumbosacral Transforaminal Epidural Steroid Injection - Sub-Pedicular Approach with Fluoroscopic Guidance  Patient: Leslie [name]      Date of Birth: 10-Feb-1951 MRN: [id number] PCP: Philip Aspen, Limmie Patricia, MD      Visit Date: 02/07/2022   Universal Protocol:    Date/Time: 02/07/2022  Consent Given By: the patient  Position: PRONE  Additional Comments: Vital signs were monitored before and after the procedure. Patient was prepped and draped in the usual sterile fashion. The correct patient, procedure, and site was verified.   Injection Procedure Details:   Procedure diagnoses: Lumbar radiculopathy [M54.16]    Meds Administered:  Meds ordered this encounter  Medications   methylPREDNISolone acetate (DEPO-MEDROL) injection 80 mg    Laterality: Bilateral  Location/Site: L4  Needle:5.0 in., 22 ga.  Short bevel or Quincke spinal needle  Needle Placement: Transforaminal  Findings:    -Comments: Excellent flow of contrast along the nerve, nerve root and into the epidural space.  Procedure Details: After squaring off the end-plates to get a true AP view, the C-arm was positioned so that an oblique view of the foramen as noted above was visualized. The target area is just inferior to the "nose of the scotty dog" or sub pedicular. The soft tissues overlying this structure were infiltrated with 2-3 ml. of 1% Lidocaine without Epinephrine.  The spinal needle was inserted toward the target using a "trajectory" view along the fluoroscope beam.  Under AP and lateral visualization, the needle was advanced so it did not puncture dura and was located close the 6 O'Clock position of the pedical in AP tracterory. Biplanar projections were used to confirm position. Aspiration was confirmed to be negative for CSF and/or blood. A 1-2 ml. volume of Isovue-250 was injected and flow of contrast was noted at each level. Radiographs were obtained for documentation purposes.   After  attaining the desired flow of contrast documented above, a 0.5 to 1.0 ml test dose of 0.25% Marcaine was injected into each respective transforaminal space.  The patient was observed for 90 seconds post injection.  After no sensory deficits were reported, and normal lower extremity motor function was noted,   the above injectate was administered so that equal amounts of the injectate were placed at each foramen (level) into the transforaminal epidural space.   Additional Comments:  The patient tolerated the procedure well Dressing: 2 x 2 sterile gauze and Band-Aid    Post-procedure details: Patient was observed during the procedure. Post-procedure instructions were reviewed.  Patient left the clinic in stable condition.

## 2022-02-18 NOTE — Progress Notes (Signed)
Leslie [name] - 71 y.o. female MRN [id number]  Date of birth: 09-21-1950  Office Visit Note: Visit Date: 02/07/2022 PCP: Philip Aspen, Limmie Patricia, MD Referred by: Philip Aspen, Estel*  Subjective: Chief Complaint  Patient presents with   Lower Back - Pain   Right Leg - Pain   Left Leg - Pain   Left Foot - Pain, Numbness   Right Foot - Pain, Numbness   HPI:  Leslie [name] is a 71 y.o. female who comes in today at the request of Rexene Edison, PA-C for planned Bilateral L4-5 Lumbar Transforaminal epidural steroid injection with fluoroscopic guidance.  The patient has failed conservative care including home exercise, medications, time and activity modification.  This injection will be diagnostic and hopefully therapeutic.  Please see requesting physician notes for further details and justification. MRI reviewed with images and spine model.  MRI reviewed in the note below.  Epidural injection completed in December gave her quite a bit of relief.  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  Depending on relief she likely at some point would need consultation with spine surgery for possible decompression.   ROS Otherwise per HPI.  Assessment & Plan: Visit Diagnoses:    ICD-10-CM   1. Lumbar radiculopathy  M54.16 XR C-ARM NO REPORT    Epidural Steroid injection    methylPREDNISolone acetate (DEPO-MEDROL) injection 80 mg      Plan: No additional findings.   Meds & Orders:  Meds ordered this encounter  Medications   methylPREDNISolone acetate (DEPO-MEDROL) injection 80 mg    Orders Placed This Encounter  Procedures   XR C-ARM NO REPORT   Epidural Steroid injection    Follow-up: Return for visit to requesting provider as needed.   Procedures: No procedures performed  Lumbosacral Transforaminal Epidural Steroid Injection - Sub-Pedicular Approach with Fluoroscopic Guidance  Patient: Leslie [name]      Date of Birth: 1950/09/18 MRN:  [id number] PCP: Philip Aspen, Limmie Patricia, MD      Visit Date: 02/07/2022   Universal Protocol:    Date/Time: 02/07/2022  Consent Given By: the patient  Position: PRONE  Additional Comments: Vital signs were monitored before and after the procedure. Patient was prepped and draped in the usual sterile fashion. The correct patient, procedure, and site was verified.   Injection Procedure Details:   Procedure diagnoses: Lumbar radiculopathy [M54.16]    Meds Administered:  Meds ordered this encounter  Medications   methylPREDNISolone acetate (DEPO-MEDROL) injection 80 mg    Laterality: Bilateral  Location/Site: L4  Needle:5.0 in., 22 ga.  Short bevel or Quincke spinal needle  Needle Placement: Transforaminal  Findings:    -Comments: Excellent flow of contrast along the nerve, nerve root and into the epidural space.  Procedure Details: After squaring off the end-plates to get a true AP view, the C-arm was positioned so that an oblique view of the foramen as noted above was visualized. The target area is just inferior to the "nose of the scotty dog" or sub pedicular. The soft tissues overlying this structure were infiltrated with 2-3 ml. of 1% Lidocaine without Epinephrine.  The spinal needle was inserted toward the target using a "trajectory" view along the fluoroscope beam.  Under AP and lateral visualization, the needle was advanced so it did not puncture dura and was located close the 6 O'Clock position of the pedical in AP tracterory. Biplanar projections were used to confirm position. Aspiration was confirmed to be negative for  CSF and/or blood. A 1-2 ml. volume of Isovue-250 was injected and flow of contrast was noted at each level. Radiographs were obtained for documentation purposes.   After attaining the desired flow of contrast documented above, a 0.5 to 1.0 ml test dose of 0.25% Marcaine was injected into each respective transforaminal space.  The patient was observed  for 90 seconds post injection.  After no sensory deficits were reported, and normal lower extremity motor function was noted,   the above injectate was administered so that equal amounts of the injectate were placed at each foramen (level) into the transforaminal epidural space.   Additional Comments:  The patient tolerated the procedure well Dressing: 2 x 2 sterile gauze and Band-Aid    Post-procedure details: Patient was observed during the procedure. Post-procedure instructions were reviewed.  Patient left the clinic in stable condition.    Clinical History: No specialty comments available.     Objective:  VS:  HT:    WT:   BMI:     BP:(!) 153/86  HR:(!) 103bpm  TEMP: ( )  RESP:  Physical Exam Vitals and nursing note reviewed.  Constitutional:      General: She is not in acute distress.    Appearance: Normal appearance. She is not ill-appearing.  HENT:     Head: Normocephalic and atraumatic.     Right Ear: External ear normal.     Left Ear: External ear normal.  Eyes:     Extraocular Movements: Extraocular movements intact.  Cardiovascular:     Rate and Rhythm: Normal rate.     Pulses: Normal pulses.  Pulmonary:     Effort: Pulmonary effort is normal. No respiratory distress.  Abdominal:     General: There is no distension.     Palpations: Abdomen is soft.  Musculoskeletal:        General: Tenderness present.     Cervical back: Neck supple.     Right lower leg: No edema.     Left lower leg: No edema.     Comments: Patient has good distal strength with no pain over the greater trochanters.  No clonus or focal weakness.  Skin:    Findings: No erythema, lesion or rash.  Neurological:     General: No focal deficit present.     Mental Status: She is alert and oriented to person, place, and time.     Sensory: No sensory deficit.     Motor: No weakness or abnormal muscle tone.     Coordination: Coordination normal.  Psychiatric:        Mood and Affect: Mood  normal.        Behavior: Behavior normal.      Imaging: No results found.

## 2022-02-27 ENCOUNTER — Telehealth: Payer: Self-pay | Admitting: Physical Medicine and Rehabilitation

## 2022-02-27 NOTE — Telephone Encounter (Signed)
Patient called. She would like to cxl the appointment with Dr. Alvester Morin.

## 2022-02-28 ENCOUNTER — Ambulatory Visit: Payer: Medicare PPO | Admitting: Physical Medicine and Rehabilitation

## 2022-03-09 ENCOUNTER — Other Ambulatory Visit: Payer: Self-pay | Admitting: Orthopaedic Surgery

## 2022-03-11 ENCOUNTER — Ambulatory Visit: Payer: Medicare PPO | Admitting: Physician Assistant

## 2022-03-11 ENCOUNTER — Encounter: Payer: Self-pay | Admitting: Physician Assistant

## 2022-03-11 ENCOUNTER — Ambulatory Visit (INDEPENDENT_AMBULATORY_CARE_PROVIDER_SITE_OTHER): Payer: Medicare PPO

## 2022-03-11 ENCOUNTER — Ambulatory Visit: Payer: Self-pay

## 2022-03-11 DIAGNOSIS — Z96652 Presence of left artificial knee joint: Secondary | ICD-10-CM | POA: Diagnosis not present

## 2022-03-11 DIAGNOSIS — Z96651 Presence of right artificial knee joint: Secondary | ICD-10-CM | POA: Diagnosis not present

## 2022-03-11 DIAGNOSIS — Z96653 Presence of artificial knee joint, bilateral: Secondary | ICD-10-CM

## 2022-03-11 MED ORDER — GABAPENTIN 100 MG PO CAPS: ORAL_CAPSULE | ORAL | 1 refills | Status: DC

## 2022-03-11 NOTE — Progress Notes (Signed)
Office Visit Note   Patient: Leslie [name]           Date of Birth: [birth year]           MRN: [id number] Visit Date: 03/11/2022              Requested by: Philip Aspen, Limmie Patricia, MD [address] [place],  [place] [address] PCP: Philip Aspen, Limmie Patricia, MD   Assessment & Plan: Visit Diagnoses:  1. Status post right knee replacement   2. History of total left knee replacement     Plan:  We will send her to formal physical therapy to work on quad strengthening gait and balance.  She will follow-up with [place] as needed.  Reviewed radiographs with her reported questions were encouraged and answered  Follow-Up Instructions: Return if symptoms worsen or fail to improve.   Orders:  Orders Placed This Encounter  Procedures   XR Knee 1-2 Views Left   XR Knee 1-2 Views Right   No orders of the defined types were placed in this encounter.     Procedures: No procedures performed   Clinical Data: No additional findings.   Subjective: Chief Complaint  Patient presents with   Left Knee - Pain   Right Knee - Pain    HPI Leslie [name] comes in today for bilateral knee pain and weakness.  She states she is following up with shaky.  Feels her knees are shaky and heavy at times.  No known injury.  She is really been doing no therapy for her knees or strengthening.  She states that she had sciatica and was mainly concentrating on the right lumbar sciatica until she had epidural steroid on 02/07/2022 which is helped with the sciatic-like symptoms.  Review of Systems Negative for fevers or chills.  Objective: Vital Signs: There were no vitals taken for this visit.  Physical Exam Constitutional:      Appearance: She is not ill-appearing or diaphoretic.  Pulmonary:     Effort: Pulmonary effort is normal.  Neurological:     Mental Status: She is alert and oriented to person, place, and time.  Psychiatric:        Mood and Affect: Mood normal.      Ortho Exam Bilateral knees: Full extension full flexion.  No instability valgus varus stressing of either knee.  Quad atrophy bilateral knees.  Surgical incisions well-healed bilaterally.  No abnormal warmth erythema or effusion of either knee. Specialty Comments:  No specialty comments available.  Imaging: XR Knee 1-2 Views Right  Result Date: 03/11/2022 Right knee 2 views: He is well located.  No acute fractures or bony abnormalities.  Status post right total knee arthroplasty with well-seated components.  XR Knee 1-2 Views Left  Result Date: 03/11/2022 Left knee 2 views: No acute fractures.  Status post left total knee arthroplasty well-seated components.  Knee is well located.    PMFS History: Patient Active Problem List   Diagnosis Date Noted   Status post right knee replacement 05/04/2021   Vitamin B12 deficiency 03/14/2021   Macrocytosis without anemia 03/14/2021   Vitamin D deficiency 02/17/2020   Sleep disorder 07/10/2018   ADHD (attention deficit hyperactivity disorder) 10/01/2017   Unilateral primary osteoarthritis, right knee 08/20/2017   Chronic pain of both shoulders 08/20/2017   Complete tear of right rotator cuff 04/29/2017   Osteoarthritis of left knee 06/16/2015   Status post total left knee  replacement 06/16/2015   Attention deficit disorder 09/14/2007   COLONIC POLYPS, HX OF 08/03/2007   Essential hypertension 01/20/2007   OBESITY NOS 12/04/2006   Allergic rhinitis 12/04/2006   GASTROJEJUNOSTOMY, HX OF 12/04/2006   BREAST BIOPSY, HX OF 12/04/2006   Past Medical History:  Diagnosis Date   ADD (attention deficit disorder)    Allergy    Anemia    Arthritis 07/22/2014   osteoarthritis left knee   Colon polyps    Hypertension    Left knee pain    chronic   Narcotic addiction (HCC)    Obesity    post gastric bypass   Vitamin D deficiency     Family History  Problem Relation Age of Onset   Heart disease Mother        post CABG history of  CHF   COPD Father    Diabetes Sister    Hypertension Sister        post renal transplant   Heart disease Brother        CAD    Past Surgical History:  Procedure Laterality Date   arthscopic knee surgery Left    several times   BREAST BIOPSY     BREAST SURGERY  years ago   breast biopsy   CHOLECYSTECTOMY     EYE SURGERY Bilateral 2013   Toric Lens implants   EYE SURGERY Right 2014   corneal amniotic membrane   GASTRIC BYPASS  2003   KIDNEY DONATION  2004   TOTAL KNEE ARTHROPLASTY Left 06/16/2015   Procedure: LEFT TOTAL KNEE ARTHROPLASTY;  Surgeon: Kathryne Hitch, MD;  Location: [facility];  Service: Orthopedics;  Laterality: Left;   TOTAL KNEE ARTHROPLASTY Right 05/04/2021   Procedure: RIGHT TOTAL KNEE ARTHROPLASTY;  Surgeon: Kathryne Hitch, MD;  Location: [facility];  Service: Orthopedics;  Laterality: Right;   Social History   Occupational History   Not on file  Tobacco Use   Smoking status: Former    Types: Cigarettes    Quit date: 07/22/1992    Years since quitting: 29.6   Smokeless tobacco: Never  Vaping Use   Vaping Use: Never used  Substance and Sexual Activity   Alcohol use: Yes    Comment: wine weekly   Drug use: No   Sexual activity: Not on file

## 2022-03-11 NOTE — Addendum Note (Signed)
Addended by: Richardean Canal on: 03/11/2022 11:20 AM   Modules accepted: Orders

## 2022-03-11 NOTE — Addendum Note (Signed)
Addended by: Barbette Or on: 03/11/2022 04:45 PM   Modules accepted: Orders

## 2022-03-21 ENCOUNTER — Other Ambulatory Visit: Payer: Self-pay | Admitting: Internal Medicine

## 2022-03-21 DIAGNOSIS — G479 Sleep disorder, unspecified: Secondary | ICD-10-CM

## 2022-03-28 ENCOUNTER — Other Ambulatory Visit: Payer: Self-pay | Admitting: Internal Medicine

## 2022-03-28 DIAGNOSIS — G479 Sleep disorder, unspecified: Secondary | ICD-10-CM

## 2022-04-02 ENCOUNTER — Ambulatory Visit: Payer: Medicare PPO | Admitting: Physical Therapy

## 2022-04-08 ENCOUNTER — Encounter: Payer: Self-pay | Admitting: Physical Therapy

## 2022-04-08 ENCOUNTER — Ambulatory Visit: Payer: Medicare PPO | Admitting: Physical Therapy

## 2022-04-08 ENCOUNTER — Other Ambulatory Visit: Payer: Self-pay

## 2022-04-08 ENCOUNTER — Other Ambulatory Visit: Payer: Self-pay | Admitting: Physician Assistant

## 2022-04-08 DIAGNOSIS — R600 Localized edema: Secondary | ICD-10-CM | POA: Diagnosis not present

## 2022-04-08 DIAGNOSIS — R262 Difficulty in walking, not elsewhere classified: Secondary | ICD-10-CM | POA: Diagnosis not present

## 2022-04-08 DIAGNOSIS — M25561 Pain in right knee: Secondary | ICD-10-CM | POA: Diagnosis not present

## 2022-04-08 DIAGNOSIS — M6281 Muscle weakness (generalized): Secondary | ICD-10-CM
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
## 2022-04-11 ENCOUNTER — Other Ambulatory Visit: Payer: Self-pay | Admitting: Internal Medicine

## 2022-04-11 DIAGNOSIS — I10 Essential (primary) hypertension: Secondary | ICD-10-CM

## 2022-04-15 ENCOUNTER — Ambulatory Visit: Payer: Medicare PPO | Admitting: Physical Therapy

## 2022-04-17 ENCOUNTER — Encounter: Payer: Medicare PPO | Admitting: Physical Therapy

## 2022-04-19 ENCOUNTER — Encounter: Payer: Self-pay | Admitting: Physical Therapy

## 2022-04-19 ENCOUNTER — Ambulatory Visit: Payer: Medicare PPO | Admitting: Physical Therapy

## 2022-04-19 DIAGNOSIS — R262 Difficulty in walking, not elsewhere classified: Secondary | ICD-10-CM

## 2022-04-19 DIAGNOSIS — M25561 Pain in right knee: Secondary | ICD-10-CM

## 2022-04-19 DIAGNOSIS — R600 Localized edema: Secondary | ICD-10-CM | POA: Diagnosis not present

## 2022-04-19 DIAGNOSIS — M6281 Muscle weakness (generalized): Secondary | ICD-10-CM | POA: Diagnosis not present

## 2022-04-19 NOTE — Therapy (Signed)
OUTPATIENT PHYSICAL THERAPY TREATMENT NOTE   Patient Name: Leslie [name] MRN: [id number] DOB:17-Oct-1950, 71 y.o., female Today's Date: 04/19/2022  END OF SESSION:   PT End of Session - 04/19/22 1112     Visit Number 2    Number of Visits 12    Date for PT Re-Evaluation 07/01/22    Authorization Type [employer] MCR    PT Start Time 1107    PT Stop Time 1145    PT Time Calculation (min) 38 min    Activity Tolerance Patient tolerated treatment well    Behavior During Therapy WFL for tasks assessed/performed             Past Medical History:  Diagnosis Date   ADD (attention deficit disorder)    Allergy    Anemia    Arthritis 07/22/2014   osteoarthritis left knee   Colon polyps    Hypertension    Left knee pain    chronic   Narcotic addiction (HCC)    Obesity    post gastric bypass   Vitamin D deficiency    Past Surgical History:  Procedure Laterality Date   arthscopic knee surgery Left    several times   BREAST BIOPSY     BREAST SURGERY  years ago   breast biopsy   CHOLECYSTECTOMY     EYE SURGERY Bilateral 2013   Toric Lens implants   EYE SURGERY Right 2014   corneal amniotic membrane   GASTRIC BYPASS  2003   KIDNEY DONATION  2004   TOTAL KNEE ARTHROPLASTY Left 06/16/2015   Procedure: LEFT TOTAL KNEE ARTHROPLASTY;  Surgeon: Kathryne Hitch, MD;  Location: [facility];  Service: Orthopedics;  Laterality: Left;   TOTAL KNEE ARTHROPLASTY Right 05/04/2021   Procedure: RIGHT TOTAL KNEE ARTHROPLASTY;  Surgeon: Kathryne Hitch, MD;  Location: [facility];  Service: Orthopedics;  Laterality: Right;   Patient Active Problem List   Diagnosis Date Noted   Status post right knee replacement 05/04/2021   Vitamin B12 deficiency 03/14/2021   Macrocytosis without anemia 03/14/2021   Vitamin D deficiency 02/17/2020   Sleep disorder 07/10/2018   ADHD (attention deficit hyperactivity disorder) 10/01/2017   Unilateral primary osteoarthritis, right knee 08/20/2017    Chronic pain of both shoulders 08/20/2017   Complete tear of right rotator cuff 04/29/2017   Osteoarthritis of left knee 06/16/2015   Status post total left knee replacement 06/16/2015   Attention deficit disorder 09/14/2007   COLONIC POLYPS, HX OF 08/03/2007   Essential hypertension 01/20/2007   OBESITY NOS 12/04/2006   Allergic rhinitis 12/04/2006   GASTROJEJUNOSTOMY, HX OF 12/04/2006   BREAST BIOPSY, HX OF 12/04/2006     THERAPY DIAG:  Difficulty walking  Muscle weakness (generalized)  Right knee pain, unspecified chronicity  Localized edema  PCP: Philip Aspen, Limmie Patricia, MD   REFERRING PROVIDER: Kirtland Bouchard, PA-C   REFERRING DIAG: [phone] (ICD-10-CM) - Status post right knee replacement [phone] (ICD-10-CM) - History of total left knee replacement    ONSET DATE:  Right total knee replacement on 05/04/21.   SUBJECTIVE:    SUBJECTIVE STATEMENT: She says she has had some swelling in her Rt leg and shaking in her legs. She is scared of falling. She does say the knee pain is better since last time.    PERTINENT HISTORY: HTN, R sciatica, bilat TKA   PAIN:  Are you having pain? Yes: NPRS scale: 3/10 Pain location: Rt medial knee Pain description: throbbing Aggravating factors: standing/walking more than  15 min,  Relieving factors: NSAIDs, ice helps with the swelling   PRECAUTIONS: Fall   WEIGHT BEARING RESTRICTIONS No   FALLS:  Has patient fallen in last 6 months? Yes. Number of falls 8 and has fear of falling   LIVING ENVIRONMENT: No stairs at her house.   OCCUPATION: retired   PLOF: Independent   PATIENT GOALS : improve strength and decrease falls     OBJECTIVE:    DIAGNOSTIC FINDINGS:  Imaging: XR Knee 1-2 Views Right   Result Date: 03/11/2022 Right knee 2 views: He is well located.  No acute fractures or bony abnormalities.  Status post right total knee arthroplasty with well-seated components.   XR Knee 1-2 Views Left   Result Date:  03/11/2022 Left knee 2 views: No acute fractures.  Status post left total knee arthroplasty well-seated components.  Knee is well located.    PATIENT SURVEYS:  FOTO Eval 40% functional, goal is 50%   COGNITION:           Overall cognitive status: Within functional limits for tasks assessed                          SENSATION: WFL   EDEMA:  Mild to moderate edema in left knee   MUSCLE LENGTH:     POSTURE:    PALPATION:     LOWER EXTREMITY ROM:   Active ROM/PROM Right eval Left eval  Hip flexion      Hip extension      Hip abduction      Hip adduction      Hip internal rotation      Hip external rotation      Knee flexion 120/    Knee extension 2/0    Ankle dorsiflexion      Ankle plantarflexion      Ankle inversion      Ankle eversion       (Blank rows = not tested)   LOWER EXTREMITY MMT:   MMT Right eval Left eval  Hip flexion 3 4+  Hip extension      Hip abduction      Hip adduction      Hip internal rotation      Hip external rotation      Knee flexion 4- 4  Knee extension 4/5 28.5# 4+ 37#  Ankle dorsiflexion      Ankle plantarflexion      Ankle inversion      Ankle eversion       (Blank rows = not tested)   LOWER EXTREMITY SPECIAL TESTS:      FUNCTIONAL TESTS:  5 times sit to stand: 20.9   GAIT:eval Comments: limited community Ambulator, does not use AD and has unsteady gait.    TODAY'S TREATMENT:  04/19/22 -Recumbent bike L2 X 8 min -Slantboard stretch 2X30 sec bilat -Heel and toe raises X 15 -Tandem balance 30 sec X 2 bilat -Leg press 75# DL 2X10 -Leg extension machine DL 10# 2X10 -Leg curl machine 25# DL 2X10 -Sidestepping at counter top no UE support 3 round trips -Retrowalking at counter top one UE support 2 round trips -Walking with head turns at counter top one UE support 2 round trips  Eval -HEP creation and review, see below for details -Nu step L8 X 3 min -Leg press 75# DL 2X10 -Leg extension machine DL 10# 2X10 -Leg  curl machine 25# DL 2X10   PATIENT EDUCATION: Education details: HEP,  PT plan of care Person educated: Patient Education method: Explanation, Demonstration, Verbal cues, and Handouts Education comprehension: verbalized understanding and needs further education    HOME EXERCISE PROGRAM: Access Code: [email or web address] URL: [email or web address] Date: 04/19/2022 Prepared by: Ivery Quale  Exercises - Sit to Stand with Armchair  - 2 x daily - 7 x weekly - 2 sets - 5 reps - Seated Straight Leg Raise with Quad Contraction  - 2 x daily - 6 x weekly - 1-2 sets - 10 reps - Single Leg Knee Extension with Weight Machine  - 1 x daily - 2-3 x weekly - 2 sets - 10-20 reps - Full Leg Press with Resistance Around Knees  - 1 x daily - 2-3 x weekly - 2-3 sets - 10 reps - Single Leg Hamstring Curl with Weight Machine  - 1 x daily - 2-3 x weekly - 2-3 sets - 10 reps - Tandem Stance  - 2 x daily - 7 x weekly - 1 sets - 4-5 reps - 20 second hold - Side Stepping with Counter Support  - 2 x daily - 6 x weekly - 1 sets - 3-5 reps - Backward Walking with Counter Support  - 2 x daily - 6 x weekly - 1 sets - 3-5 reps - Walking with Head Rotation  - 2 x daily - 6 x weekly - 1 sets - 3-5 reps   ASSESSMENT:   CLINICAL IMPRESSION: Progressed her balance program as she has fear of falling and gait instability. She had good tolerance to strength program but does fatigue easily and will continue to benefit from PT for more balance and strengthening. I added balance exercises to her HEP today as well.    OBJECTIVE IMPAIRMENTS: decreased activity tolerance, difficulty walking, decreased balance, decreased endurance, decreased mobility, decreased ROM, decreased strength, impaired flexibility, impaired UE/LE use, postural dysfunction, and pain.   ACTIVITY LIMITATIONS: bending, lifting, carry, locomotion, cleaning, community activity PERSONAL FACTORS: HTN, R sciatica, bilat TKA are also affecting patient's functional  outcome.   REHAB POTENTIAL: Good   CLINICAL DECISION MAKING: Stable/uncomplicated   EVALUATION COMPLEXITY: Low       GOALS: Short term PT Goals Target date: 05/06/2022 Pt will be I and compliant with HEP. Baseline:  Goal status: New Pt will decrease 5 times sit to stand test less than 18 seconds (UE push up from knees) Baseline: Goal status: New   Long term PT goals Target date: 07/01/2022 Pt will improve  Rt quad strength to at least 35# on hand held dynamometer or 5/5 MMT to improve functional strength Baseline:28.5 Goal status: New Pt will improve FOTO to at least 50% functional to show improved function Baseline: Goal status: New Pt will reduce pain by overall 50% overall with usual activity Baseline: Goal status: New Pt will report decreased number of falls since starting PT Baseline: Goal status: New Pt will improve 5 times sit to stand test to less than 15 seconds with UE push off from knees Baseline: Goal status: New   PLAN: PT FREQUENCY: 1-2 times per week    PT DURATION:8-12 weeks   PLANNED INTERVENTIONS (unless contraindicated): aquatic PT, Canalith repositioning, cryotherapy, Electrical stimulation, Iontophoresis with 4 mg/ml dexamethasome, Moist heat, traction, Ultrasound, gait training, Therapeutic exercise, balance training, neuromuscular re-education, patient/family education, prosthetic training, manual techniques, passive ROM, dry needling, taping, vasopnuematic device, vestibular, spinal manipulations, joint manipulations   PLAN FOR NEXT SESSION: review HEP, balance and gait, quad strength, work on gym equipment and encourage her to  join gym as we transition to independent program    April Manson, PT,DPT 04/19/2022, 11:14 AM

## 2022-04-21 ENCOUNTER — Other Ambulatory Visit: Payer: Self-pay | Admitting: Internal Medicine

## 2022-04-21 DIAGNOSIS — F339 Major depressive disorder, recurrent, unspecified: Secondary | ICD-10-CM

## 2022-04-22 ENCOUNTER — Encounter: Payer: Medicare PPO | Admitting: Physical Therapy

## 2022-04-24 ENCOUNTER — Encounter: Payer: Medicare PPO | Admitting: Physical Therapy

## 2022-04-30 ENCOUNTER — Encounter: Payer: Self-pay | Admitting: Physical Therapy

## 2022-04-30 ENCOUNTER — Ambulatory Visit: Payer: Medicare PPO | Admitting: Physical Therapy

## 2022-04-30 DIAGNOSIS — M6281 Muscle weakness (generalized): Secondary | ICD-10-CM

## 2022-04-30 DIAGNOSIS — R262 Difficulty in walking, not elsewhere classified: Secondary | ICD-10-CM | POA: Diagnosis not present

## 2022-04-30 DIAGNOSIS — R600 Localized edema: Secondary | ICD-10-CM

## 2022-04-30 DIAGNOSIS — M25561 Pain in right knee: Secondary | ICD-10-CM | POA: Diagnosis not present

## 2022-04-30 NOTE — Therapy (Signed)
OUTPATIENT PHYSICAL THERAPY TREATMENT NOTE   Patient Name: Leslie [name] MRN: [id number] DOB:08-07-1950, 71 y.o., female Today's Date: 04/30/2022  END OF SESSION:   PT End of Session - 04/30/22 0941     Visit Number 3    Number of Visits 12    Date for PT Re-Evaluation 07/01/22    Authorization Type [employer] MCR    Activity Tolerance Patient tolerated treatment well    Behavior During Therapy [facility] for tasks assessed/performed             Past Medical History:  Diagnosis Date   ADD (attention deficit disorder)    Allergy    Anemia    Arthritis 07/22/2014   osteoarthritis left knee   Colon polyps    Hypertension    Left knee pain    chronic   Narcotic addiction ([place])    Obesity    post gastric bypass   Vitamin D deficiency    Past Surgical History:  Procedure Laterality Date   arthscopic knee surgery Left    several times   BREAST BIOPSY     BREAST SURGERY  years ago   breast biopsy   CHOLECYSTECTOMY     EYE SURGERY Bilateral 2013   Toric Lens implants   EYE SURGERY Right 2014   corneal amniotic membrane   GASTRIC BYPASS  2003   KIDNEY DONATION  2004   TOTAL KNEE ARTHROPLASTY Left 06/16/2015   Procedure: LEFT TOTAL KNEE ARTHROPLASTY;  Surgeon: Mcarthur Rossetti, MD;  Location: [facility];  Service: Orthopedics;  Laterality: Left;   TOTAL KNEE ARTHROPLASTY Right 05/04/2021   Procedure: RIGHT TOTAL KNEE ARTHROPLASTY;  Surgeon: Mcarthur Rossetti, MD;  Location: [facility];  Service: Orthopedics;  Laterality: Right;   Patient Active Problem List   Diagnosis Date Noted   Status post right knee replacement 05/04/2021   Vitamin B12 deficiency 03/14/2021   Macrocytosis without anemia 03/14/2021   Vitamin D deficiency 02/17/2020   Sleep disorder 07/10/2018   ADHD (attention deficit hyperactivity disorder) 10/01/2017   Unilateral primary osteoarthritis, right knee 08/20/2017   Chronic pain of both shoulders 08/20/2017   Complete tear of right rotator cuff  04/29/2017   Osteoarthritis of left knee 06/16/2015   Status post total left knee replacement 06/16/2015   Attention deficit disorder 09/14/2007   COLONIC POLYPS, HX OF 08/03/2007   Essential hypertension 01/20/2007   OBESITY NOS 12/04/2006   Allergic rhinitis 12/04/2006   GASTROJEJUNOSTOMY, HX OF 12/04/2006   BREAST BIOPSY, HX OF 12/04/2006     THERAPY DIAG:  Difficulty walking  Muscle weakness (generalized)  Right knee pain, unspecified chronicity  Localized edema  PCP: Isaac Bliss, Rayford Halsted, MD   REFERRING PROVIDER: Pete Pelt, PA-C   REFERRING DIAG: [phone] (ICD-10-CM) - Status post right knee replacement [phone] (ICD-10-CM) - History of total left knee replacement    ONSET DATE:  Right total knee replacement on 05/04/21.   SUBJECTIVE:    SUBJECTIVE STATEMENT: She says she still has some swelling in her Rt foot. Not having much pain but her legs still shake, She has been inactive lately  PERTINENT HISTORY: HTN, R sciatica, bilat TKA   PAIN:  Are you having pain? Yes: NPRS scale: 2/10 Pain location: Rt medial knee Pain description: throbbing Aggravating factors: standing/walking more than 15 min,  Relieving factors: NSAIDs, ice helps with the swelling   PRECAUTIONS: Fall   WEIGHT BEARING RESTRICTIONS No   FALLS:  Has patient fallen in last 6 months?  Yes. Number of falls 8 and has fear of falling   LIVING ENVIRONMENT: No stairs at her house.   OCCUPATION: retired   PLOF: Independent   PATIENT GOALS : improve strength and decrease falls     OBJECTIVE:    DIAGNOSTIC FINDINGS:  Imaging: XR Knee 1-2 Views Right   Result Date: 03/11/2022 Right knee 2 views: He is well located.  No acute fractures or bony abnormalities.  Status post right total knee arthroplasty with well-seated components.   XR Knee 1-2 Views Left   Result Date: 03/11/2022 Left knee 2 views: No acute fractures.  Status post left total knee arthroplasty well-seated  components.  Knee is well located.    PATIENT SURVEYS:  FOTO Eval 40% functional, goal is 50%   COGNITION:           Overall cognitive status: Within functional limits for tasks assessed                          SENSATION: WFL   EDEMA:  Mild to moderate edema in left knee   MUSCLE LENGTH:     POSTURE:    PALPATION:     LOWER EXTREMITY ROM:   Active ROM/PROM Right eval Left eval  Hip flexion      Hip extension      Hip abduction      Hip adduction      Hip internal rotation      Hip external rotation      Knee flexion 120/    Knee extension 2/0    Ankle dorsiflexion      Ankle plantarflexion      Ankle inversion      Ankle eversion       (Blank rows = not tested)   LOWER EXTREMITY MMT:   MMT Right eval Left eval  Hip flexion 3 4+  Hip extension      Hip abduction      Hip adduction      Hip internal rotation      Hip external rotation      Knee flexion 4- 4  Knee extension 4/5 28.5# 4+ 37#  Ankle dorsiflexion      Ankle plantarflexion      Ankle inversion      Ankle eversion       (Blank rows = not tested)   LOWER EXTREMITY SPECIAL TESTS:      FUNCTIONAL TESTS:  5 times sit to stand: 20.9   GAIT:eval Comments: limited community Ambulator, does not use AD and has unsteady gait.    TODAY'S TREATMENT:  04/30/22 -Recumbent bike L2 X -Slantboard stretch 3X30 sec bilat -Heel and toe raises X 15 -Tandem balance 30 sec X 2 bilat -Leg press 75# DL [id number], then SL 46# X 15 bilat -Leg extension machine DL 27# [id number] -Leg curl machine 25# DL [id number] -Balance on airex foam pad with feet narrow and head turns X 10 bilat -Balance on airex foam pad with feet apart and eyes closed 10 sec X 5 -Sit to stand X 5 with slow eccentrics  04/19/22 -Recumbent bike L2 X 8 min -Slantboard stretch 2X30 sec bilat -Heel and toe raises X 15 -Tandem balance 30 sec X 2 bilat -Leg press 75# DL [id number] -Leg extension machine DL 93# [id number] -Leg curl machine 25# DL  [id number] -Sidestepping at counter top no UE support 3 round trips -Retrowalking at counter top one UE support 2 round trips -  Walking with head turns at counter top one UE support 2 round trips     PATIENT EDUCATION: Education details: HEP, PT plan of care Person educated: Patient Education method: Explanation, Demonstration, Verbal cues, and Handouts Education comprehension: verbalized understanding and needs further education    HOME EXERCISE PROGRAM: Access Code: [email or web address] URL: [email or web address] Date: 04/19/2022 Prepared by: Ivery Quale  Exercises - Sit to Stand with Armchair  - 2 x daily - 7 x weekly - 2 sets - 5 reps - Seated Straight Leg Raise with Quad Contraction  - 2 x daily - 6 x weekly - 1-2 sets - 10 reps - Single Leg Knee Extension with Weight Machine  - 1 x daily - 2-3 x weekly - 2 sets - 10-20 reps - Full Leg Press with Resistance Around Knees  - 1 x daily - 2-3 x weekly - 2-3 sets - 10 reps - Single Leg Hamstring Curl with Weight Machine  - 1 x daily - 2-3 x weekly - 2-3 sets - 10 reps - Tandem Stance  - 2 x daily - 7 x weekly - 1 sets - 4-5 reps - 20 second hold - Side Stepping with Counter Support  - 2 x daily - 6 x weekly - 1 sets - 3-5 reps - Backward Walking with Counter Support  - 2 x daily - 6 x weekly - 1 sets - 3-5 reps - Walking with Head Rotation  - 2 x daily - 6 x weekly - 1 sets - 3-5 reps   ASSESSMENT:   CLINICAL IMPRESSION: We continued to work to improve her strength and balance deficits today with good pain tolerance. She will continue to need work on this to improve her functional abilities for standing activity and gait.    OBJECTIVE IMPAIRMENTS: decreased activity tolerance, difficulty walking, decreased balance, decreased endurance, decreased mobility, decreased ROM, decreased strength, impaired flexibility, impaired UE/LE use, postural dysfunction, and pain.   ACTIVITY LIMITATIONS: bending, lifting, carry, locomotion, cleaning,  community activity PERSONAL FACTORS: HTN, R sciatica, bilat TKA are also affecting patient's functional outcome.   REHAB POTENTIAL: Good   CLINICAL DECISION MAKING: Stable/uncomplicated   EVALUATION COMPLEXITY: Low       GOALS: Short term PT Goals Target date: 05/06/2022 Pt will be I and compliant with HEP. Baseline:  Goal status: New Pt will decrease 5 times sit to stand test less than 18 seconds (UE push up from knees) Baseline: Goal status: New   Long term PT goals Target date: 07/01/2022 Pt will improve  Rt quad strength to at least 35# on hand held dynamometer or 5/5 MMT to improve functional strength Baseline:28.5 Goal status: New Pt will improve FOTO to at least 50% functional to show improved function Baseline: Goal status: New Pt will reduce pain by overall 50% overall with usual activity Baseline: Goal status: New Pt will report decreased number of falls since starting PT Baseline: Goal status: New Pt will improve 5 times sit to stand test to less than 15 seconds with UE push off from knees Baseline: Goal status: New   PLAN: PT FREQUENCY: 1-2 times per week    PT DURATION:8-12 weeks   PLANNED INTERVENTIONS (unless contraindicated): aquatic PT, Canalith repositioning, cryotherapy, Electrical stimulation, Iontophoresis with 4 mg/ml dexamethasome, Moist heat, traction, Ultrasound, gait training, Therapeutic exercise, balance training, neuromuscular re-education, patient/family education, prosthetic training, manual techniques, passive ROM, dry needling, taping, vasopnuematic device, vestibular, spinal manipulations, joint manipulations   PLAN FOR NEXT SESSION: monitor for soreness, update goals  and measurements next time, continue to work balance and gait, quad strength, work on gym equipment and encourage her to join gym as we transition to independent program    April Manson, PT,DPT 04/30/2022, 9:49 AM

## 2022-05-02 ENCOUNTER — Encounter: Payer: Self-pay | Admitting: Rehabilitative and Restorative Service Providers"

## 2022-05-02 ENCOUNTER — Ambulatory Visit: Payer: Medicare PPO | Admitting: Rehabilitative and Restorative Service Providers"

## 2022-05-02 DIAGNOSIS — M6281 Muscle weakness (generalized): Secondary | ICD-10-CM | POA: Diagnosis not present

## 2022-05-02 DIAGNOSIS — M25561 Pain in right knee: Secondary | ICD-10-CM

## 2022-05-02 DIAGNOSIS — R262 Difficulty in walking, not elsewhere classified: Secondary | ICD-10-CM | POA: Diagnosis not present

## 2022-05-02 DIAGNOSIS — R600 Localized edema: Secondary | ICD-10-CM

## 2022-05-02 NOTE — Therapy (Signed)
OUTPATIENT PHYSICAL THERAPY TREATMENT NOTE   Patient Name: Leslie [name] MRN: [id number] DOB:06-06-1951, 71 y.o., female Today's Date: 05/02/2022  END OF SESSION:   PT End of Session - 05/02/22 1541     Visit Number 4    Number of Visits 12    Date for PT Re-Evaluation 07/01/22    Authorization Type [employer] MCR    Progress Note Due on Visit 10    PT Start Time 1154    PT Stop Time 1233    PT Time Calculation (min) 39 min    Activity Tolerance Patient tolerated treatment well;Patient limited by fatigue    Behavior During Therapy [facility] for tasks assessed/performed              Past Medical History:  Diagnosis Date   ADD (attention deficit disorder)    Allergy    Anemia    Arthritis 07/22/2014   osteoarthritis left knee   Colon polyps    Hypertension    Left knee pain    chronic   Narcotic addiction ([place])    Obesity    post gastric bypass   Vitamin D deficiency    Past Surgical History:  Procedure Laterality Date   arthscopic knee surgery Left    several times   BREAST BIOPSY     BREAST SURGERY  years ago   breast biopsy   CHOLECYSTECTOMY     EYE SURGERY Bilateral 2013   Toric Lens implants   EYE SURGERY Right 2014   corneal amniotic membrane   GASTRIC BYPASS  2003   KIDNEY DONATION  2004   TOTAL KNEE ARTHROPLASTY Left 06/16/2015   Procedure: LEFT TOTAL KNEE ARTHROPLASTY;  Surgeon: Mcarthur Rossetti, MD;  Location: [facility];  Service: Orthopedics;  Laterality: Left;   TOTAL KNEE ARTHROPLASTY Right 05/04/2021   Procedure: RIGHT TOTAL KNEE ARTHROPLASTY;  Surgeon: Mcarthur Rossetti, MD;  Location: [facility];  Service: Orthopedics;  Laterality: Right;   Patient Active Problem List   Diagnosis Date Noted   Status post right knee replacement 05/04/2021   Vitamin B12 deficiency 03/14/2021   Macrocytosis without anemia 03/14/2021   Vitamin D deficiency 02/17/2020   Sleep disorder 07/10/2018   ADHD (attention deficit hyperactivity disorder)  10/01/2017   Unilateral primary osteoarthritis, right knee 08/20/2017   Chronic pain of both shoulders 08/20/2017   Complete tear of right rotator cuff 04/29/2017   Osteoarthritis of left knee 06/16/2015   Status post total left knee replacement 06/16/2015   Attention deficit disorder 09/14/2007   COLONIC POLYPS, HX OF 08/03/2007   Essential hypertension 01/20/2007   OBESITY NOS 12/04/2006   Allergic rhinitis 12/04/2006   GASTROJEJUNOSTOMY, HX OF 12/04/2006   BREAST BIOPSY, HX OF 12/04/2006     THERAPY DIAG:  Difficulty walking  Muscle weakness (generalized)  Right knee pain, unspecified chronicity  Localized edema  PCP: Isaac Bliss, Rayford Halsted, MD   REFERRING PROVIDER: Pete Pelt, PA-C   REFERRING DIAG: [phone] (ICD-10-CM) - Status post right knee replacement [phone] (ICD-10-CM) - History of total left knee replacement    ONSET DATE:  Right total knee replacement on 05/04/21.   SUBJECTIVE:    SUBJECTIVE STATEMENT: Leslie [name] is back on her "fluid pill" and her R foot has not been as swollen.  Getting stronger with better balance is her main focus.  PERTINENT HISTORY: HTN, R sciatica, bilat TKA   PAIN:  Are you having pain? Yes: NPRS scale: 0-1/10 this week Pain location: Rt medial knee Pain  description: throbbing Aggravating factors: standing/walking more than 15 min,  Relieving factors: NSAIDs, ice helps with the swelling   PRECAUTIONS: Fall   WEIGHT BEARING RESTRICTIONS No   FALLS:  Has patient fallen in last 6 months? Yes. Number of falls 8 and has fear of falling   LIVING ENVIRONMENT: No stairs at her house.   OCCUPATION: retired   PLOF: Independent   PATIENT GOALS : improve strength and decrease falls     OBJECTIVE:    DIAGNOSTIC FINDINGS:  Imaging: XR Knee 1-2 Views Right   Result Date: 03/11/2022 Right knee 2 views: He is well located.  No acute fractures or bony abnormalities.  Status post right total knee arthroplasty with  well-seated components.   XR Knee 1-2 Views Left   Result Date: 03/11/2022 Left knee 2 views: No acute fractures.  Status post left total knee arthroplasty well-seated components.  Knee is well located.    PATIENT SURVEYS:  FOTO Eval 40% functional, goal is 50%   COGNITION:           Overall cognitive status: Within functional limits for tasks assessed                          SENSATION: WFL   EDEMA:  Mild to moderate edema in left knee   MUSCLE LENGTH:     POSTURE:    PALPATION:     LOWER EXTREMITY ROM:   Active ROM/PROM Right eval Left eval  Hip flexion      Hip extension      Hip abduction      Hip adduction      Hip internal rotation      Hip external rotation      Knee flexion 120/    Knee extension 2/0    Ankle dorsiflexion      Ankle plantarflexion      Ankle inversion      Ankle eversion       (Blank rows = not tested)   LOWER EXTREMITY MMT:   MMT Right eval Left eval  Hip flexion 3 4+  Hip extension      Hip abduction      Hip adduction      Hip internal rotation      Hip external rotation      Knee flexion 4- 4  Knee extension 4/5 28.5# 4+ 37#  Ankle dorsiflexion      Ankle plantarflexion      Ankle inversion      Ankle eversion       (Blank rows = not tested)   LOWER EXTREMITY SPECIAL TESTS:      FUNCTIONAL TESTS:  5 times sit to stand: 20.9   GAIT:eval Comments: limited community Ambulator, does not use AD and has unsteady gait.    TODAY'S TREATMENT:  05/02/2022 Recumbent bike Seat 5 for 5 minutes Level 2  Functional Activities: Step-up and over 4 and 6 inch step 10X each leg slow eccentrics Step-down off 2 inch step 2 sets of 5 Bil with slow eccentrics Single Leg Press Rt and Lt 2 sets of 10 each 50# slow eccentrics  Neuromuscular re-education: Feet together eyes closed 2X 30 seconds and tandem balance eyes open 4X 30 seconds each   04/30/22 -Recumbent bike L2 X 30mn -Slantboard stretch 3X30 sec bilat -Heel and  toe raises X 15 -Tandem balance 30 sec X 2 bilat -Leg press 75# DL 2X10, then SL 31# X 15  bilat -Leg extension machine DL 10# 2X10 -Leg curl machine 25# DL 2X10 -Balance on airex foam pad with feet narrow and head turns X 10 bilat -Balance on airex foam pad with feet apart and eyes closed 10 sec X 5 -Sit to stand X 5 with slow eccentrics   04/19/22 -Recumbent bike L2 X 8 min -Slantboard stretch 2X30 sec bilat -Heel and toe raises X 15 -Tandem balance 30 sec X 2 bilat -Leg press 75# DL 2X10 -Leg extension machine DL 10# 2X10 -Leg curl machine 25# DL 2X10 -Sidestepping at counter top no UE support 3 round trips -Retrowalking at counter top one UE support 2 round trips -Walking with head turns at counter top one UE support 2 round trips   PATIENT EDUCATION: Education details: HEP, PT plan of care Person educated: Patient Education method: Explanation, Demonstration, Verbal cues, and Handouts Education comprehension: verbalized understanding and needs further education    HOME EXERCISE PROGRAM: Access Code: [email or web address] URL: [email or web address] Date: 04/19/2022 Prepared by: Elsie Ra  Exercises - Sit to Stand with Armchair  - 2 x daily - 7 x weekly - 2 sets - 5 reps - Seated Straight Leg Raise with Quad Contraction  - 2 x daily - 6 x weekly - 1-2 sets - 10 reps - Single Leg Knee Extension with Weight Machine  - 1 x daily - 2-3 x weekly - 2 sets - 10-20 reps - Full Leg Press with Resistance Around Knees  - 1 x daily - 2-3 x weekly - 2-3 sets - 10 reps - Single Leg Hamstring Curl with Weight Machine  - 1 x daily - 2-3 x weekly - 2-3 sets - 10 reps - Tandem Stance  - 2 x daily - 7 x weekly - 1 sets - 4-5 reps - 20 second hold - Side Stepping with Counter Support  - 2 x daily - 6 x weekly - 1 sets - 3-5 reps - Backward Walking with Counter Support  - 2 x daily - 6 x weekly - 1 sets - 3-5 reps - Walking with Head Rotation  - 2 x daily - 6 x weekly - 1 sets - 3-5  reps   ASSESSMENT:   CLINICAL IMPRESSION: Katilyn notes progress with her balance (no falls in ~ 4 months) and strength since starting physical therapy.  She still gets feelings of her R knee "buckling" due to fatigue and remaining quadriceps weakness.  Because this still makes her a falls risk and because she needs a handrail and substitution to descend stairs, she will benefit from the full course of physical therapy recommended at evaluation.   OBJECTIVE IMPAIRMENTS: decreased activity tolerance, difficulty walking, decreased balance, decreased endurance, decreased mobility, decreased ROM, decreased strength, impaired flexibility, impaired UE/LE use, postural dysfunction, and pain.   ACTIVITY LIMITATIONS: bending, lifting, carry, locomotion, cleaning, community activity PERSONAL FACTORS: HTN, R sciatica, bilat TKA are also affecting patient's functional outcome.   REHAB POTENTIAL: Good   CLINICAL DECISION MAKING: Stable/uncomplicated   EVALUATION COMPLEXITY: Low       GOALS: Short term PT Goals Target date: 05/06/2022 Pt will be I and compliant with HEP. Baseline:  Goal status: On Going 05/02/2022 Pt will decrease 5 times sit to stand test less than 18 seconds (UE push up from knees) Baseline: Goal status: Met 05/02/2022   Long term PT goals Target date: 07/01/2022 Pt will improve  Rt quad strength to at least 35# on hand held dynamometer or 5/5 MMT to improve functional strength  Baseline:28.5 Goal status: On Going 05/02/2022 Pt will improve FOTO to at least 50% functional to show improved function Baseline: Goal status: New Pt will reduce pain by overall 50% overall with usual activity Baseline: Goal status: New Pt will report decreased number of falls since starting PT Baseline: Goal status: New Pt will improve 5 times sit to stand test to less than 15 seconds with UE push off from knees Baseline: Goal status: New   PLAN: PT FREQUENCY: 1-2 times per week    PT  DURATION:8-12 weeks   PLANNED INTERVENTIONS (unless contraindicated): aquatic PT, Canalith repositioning, cryotherapy, Electrical stimulation, Iontophoresis with 4 mg/ml dexamethasome, Moist heat, traction, Ultrasound, gait training, Therapeutic exercise, balance training, neuromuscular re-education, patient/family education, prosthetic training, manual techniques, passive ROM, dry needling, taping, vasopnuematic device, vestibular, spinal manipulations, joint manipulations   PLAN FOR NEXT SESSION: Dynamic and static balance, functional strength, quadriceps strength and endurance, work on gym equipment and encourage her to join a gym as we transition to an independent program.    Farley Ly, PT, MPT 05/02/2022, 3:45 PM

## 2022-05-03 ENCOUNTER — Other Ambulatory Visit: Payer: Self-pay | Admitting: Internal Medicine

## 2022-05-03 DIAGNOSIS — F900 Attention-deficit hyperactivity disorder, predominantly inattentive type: Secondary | ICD-10-CM

## 2022-05-03 NOTE — Telephone Encounter (Signed)
Pt is calling and would like a refill on amphetamine-dextroamphetamine (ADDERALL XR) 30 MG 24 hr capsule and amphetamine-dextroamphetamine (ADDERALL) 20 MG tablet  [id number] [place], [place] - [address] AT [address] Phone:  [phone]  Fax:  [phone]

## 2022-05-06 ENCOUNTER — Encounter: Payer: Self-pay | Admitting: *Deleted

## 2022-05-06 NOTE — Telephone Encounter (Signed)
Spoke with pharmacist and Adderall 20 mg is out of stock

## 2022-05-07 ENCOUNTER — Encounter: Payer: Medicare PPO | Admitting: Physical Therapy

## 2022-05-07 MED ORDER — AMPHETAMINE-DEXTROAMPHET ER 30 MG PO CP24: 30.0000 mg | ORAL_CAPSULE | ORAL | 0 refills | Status: DC

## 2022-05-07 MED ORDER — AMPHETAMINE-DEXTROAMPHETAMINE 10 MG PO TABS: 10.0000 mg | ORAL_TABLET | Freq: Two times a day (BID) | ORAL | 0 refills | Status: DC

## 2022-05-07 NOTE — Telephone Encounter (Signed)
Rx sent 

## 2022-05-09 ENCOUNTER — Encounter: Payer: Self-pay | Admitting: Physical Therapy

## 2022-05-09 ENCOUNTER — Ambulatory Visit: Payer: Medicare PPO | Admitting: Physical Therapy

## 2022-05-09 DIAGNOSIS — R600 Localized edema: Secondary | ICD-10-CM | POA: Diagnosis not present

## 2022-05-09 DIAGNOSIS — M25561 Pain in right knee: Secondary | ICD-10-CM

## 2022-05-09 DIAGNOSIS — R262 Difficulty in walking, not elsewhere classified: Secondary | ICD-10-CM | POA: Diagnosis not present

## 2022-05-09 DIAGNOSIS — M6281 Muscle weakness (generalized): Secondary | ICD-10-CM | POA: Diagnosis not present

## 2022-05-09 NOTE — Therapy (Signed)
OUTPATIENT PHYSICAL THERAPY TREATMENT NOTE   Patient Name: Leslie [name] MRN: [id number] DOB:1951-01-15, 71 y.o., female Today's Date: 05/09/2022  END OF SESSION:   PT End of Session - 05/09/22 1156     Visit Number 5    Number of Visits 12    Date for PT Re-Evaluation 07/01/22    Authorization Type [employer] MCR until 11/17    Authorization - Number of Visits 12    Progress Note Due on Visit 10    PT Start Time 1147    PT Stop Time 1230    PT Time Calculation (min) 43 min    Activity Tolerance Patient tolerated treatment well;Patient limited by fatigue    Behavior During Therapy [facility] for tasks assessed/performed              Past Medical History:  Diagnosis Date   ADD (attention deficit disorder)    Allergy    Anemia    Arthritis 07/22/2014   osteoarthritis left knee   Colon polyps    Hypertension    Left knee pain    chronic   Narcotic addiction ([place])    Obesity    post gastric bypass   Vitamin D deficiency    Past Surgical History:  Procedure Laterality Date   arthscopic knee surgery Left    several times   BREAST BIOPSY     BREAST SURGERY  years ago   breast biopsy   CHOLECYSTECTOMY     EYE SURGERY Bilateral 2013   Toric Lens implants   EYE SURGERY Right 2014   corneal amniotic membrane   GASTRIC BYPASS  2003   KIDNEY DONATION  2004   TOTAL KNEE ARTHROPLASTY Left 06/16/2015   Procedure: LEFT TOTAL KNEE ARTHROPLASTY;  Surgeon: Mcarthur Rossetti, MD;  Location: [facility];  Service: Orthopedics;  Laterality: Left;   TOTAL KNEE ARTHROPLASTY Right 05/04/2021   Procedure: RIGHT TOTAL KNEE ARTHROPLASTY;  Surgeon: Mcarthur Rossetti, MD;  Location: [facility];  Service: Orthopedics;  Laterality: Right;   Patient Active Problem List   Diagnosis Date Noted   Status post right knee replacement 05/04/2021   Vitamin B12 deficiency 03/14/2021   Macrocytosis without anemia 03/14/2021   Vitamin D deficiency 02/17/2020   Sleep disorder 07/10/2018   ADHD  (attention deficit hyperactivity disorder) 10/01/2017   Unilateral primary osteoarthritis, right knee 08/20/2017   Chronic pain of both shoulders 08/20/2017   Complete tear of right rotator cuff 04/29/2017   Osteoarthritis of left knee 06/16/2015   Status post total left knee replacement 06/16/2015   Attention deficit disorder 09/14/2007   COLONIC POLYPS, HX OF 08/03/2007   Essential hypertension 01/20/2007   OBESITY NOS 12/04/2006   Allergic rhinitis 12/04/2006   GASTROJEJUNOSTOMY, HX OF 12/04/2006   BREAST BIOPSY, HX OF 12/04/2006     THERAPY DIAG:  Difficulty walking  Muscle weakness (generalized)  Right knee pain, unspecified chronicity  Localized edema  PCP: Isaac Bliss, Rayford Halsted, MD   REFERRING PROVIDER: Pete Pelt, PA-C   REFERRING DIAG: [phone] (ICD-10-CM) - Status post right knee replacement [phone] (ICD-10-CM) - History of total left knee replacement    ONSET DATE:  Right total knee replacement on 05/04/21.   SUBJECTIVE:    SUBJECTIVE STATEMENT: Nila is back on her "fluid pill" and her R foot has not been as swollen.  Getting stronger with better balance is her main focus.  PERTINENT HISTORY: HTN, R sciatica, bilat TKA   PAIN:  Are you having pain? Yes:  NPRS scale: 0-1/10 this week Pain location: Rt medial knee Pain description: throbbing Aggravating factors: standing/walking more than 15 min,  Relieving factors: NSAIDs, ice helps with the swelling   PRECAUTIONS: Fall   WEIGHT BEARING RESTRICTIONS No   FALLS:  Has patient fallen in last 6 months? Yes. Number of falls 8 and has fear of falling   LIVING ENVIRONMENT: No stairs at her house.   OCCUPATION: retired   PLOF: Independent   PATIENT GOALS : improve strength and decrease falls     OBJECTIVE:    DIAGNOSTIC FINDINGS:  Imaging: XR Knee 1-2 Views Right   Result Date: 03/11/2022 Right knee 2 views: He is well located.  No acute fractures or bony abnormalities.  Status  post right total knee arthroplasty with well-seated components.   XR Knee 1-2 Views Left   Result Date: 03/11/2022 Left knee 2 views: No acute fractures.  Status post left total knee arthroplasty well-seated components.  Knee is well located.    PATIENT SURVEYS:  FOTO Eval 40% functional, goal is 50%   COGNITION:           Overall cognitive status: Within functional limits for tasks assessed                          SENSATION: WFL   EDEMA:  Mild to moderate edema in left knee   MUSCLE LENGTH:     POSTURE:    PALPATION:     LOWER EXTREMITY ROM:   Active ROM/PROM Right eval Left eval  Hip flexion      Hip extension      Hip abduction      Hip adduction      Hip internal rotation      Hip external rotation      Knee flexion 120/    Knee extension 2/0    Ankle dorsiflexion      Ankle plantarflexion      Ankle inversion      Ankle eversion       (Blank rows = not tested)   LOWER EXTREMITY MMT:   MMT Right eval Left eval Right 05/09/22  Hip flexion 3 4+   Hip extension       Hip abduction       Hip adduction       Hip internal rotation       Hip external rotation       Knee flexion 4- 4   Knee extension 4/5 28.5# 4+ 37# 4/5 33#  Ankle dorsiflexion       Ankle plantarflexion       Ankle inversion       Ankle eversion        (Blank rows = not tested)   LOWER EXTREMITY SPECIAL TESTS:      FUNCTIONAL TESTS:  5 times sit to stand: 20.9   GAIT:eval Comments: limited community Ambulator, does not use AD and has unsteady gait.    TODAY'S TREATMENT:  05/09/22 -Recumbent bike seat 5 for 10 min L2 -Sit to stands slow eccentric lower no UE support X 10 -Seated LAQ with green band X 20 each leg, provided band for her to take home and add this into HEP -Seated SLR X 10 each leg -Double leg press 75# X 15 -Single Leg Press Rt and Lt 2 sets of 10 each 50# slow eccentrics -Feet together eyes closed 2X 30 seconds and tandem balance eyes open 4X 30 seconds  each  05/02/2022 Recumbent bike Seat 5 for 5 minutes Level 2  Functional Activities: Step-up and over 4 and 6 inch step 10X each leg slow eccentrics Step-down off 2 inch step 2 sets of 5 Bil with slow eccentrics Single Leg Press Rt and Lt 2 sets of 10 each 50# slow eccentrics  Neuromuscular re-education: Feet together eyes closed 2X 30 seconds and tandem balance eyes open 4X 30 seconds each   04/30/22 -Recumbent bike L2 X 75mn -Slantboard stretch 3X30 sec bilat -Heel and toe raises X 15 -Tandem balance 30 sec X 2 bilat -Leg press 75# DL 2X10, then SL 31# X 15 bilat -Leg extension machine DL 10# 2X10 -Leg curl machine 25# DL 2X10 -Balance on airex foam pad with feet narrow and head turns X 10 bilat -Balance on airex foam pad with feet apart and eyes closed 10 sec X 5 -Sit to stand X 5 with slow eccentrics   04/19/22 -Recumbent bike L2 X 8 min -Slantboard stretch 2X30 sec bilat -Heel and toe raises X 15 -Tandem balance 30 sec X 2 bilat -Leg press 75# DL 2X10 -Leg extension machine DL 10# 2X10 -Leg curl machine 25# DL 2X10 -Sidestepping at counter top no UE support 3 round trips -Retrowalking at counter top one UE support 2 round trips -Walking with head turns at counter top one UE support 2 round trips   PATIENT EDUCATION: Education details: HEP, PT plan of care Person educated: Patient Education method: Explanation, Demonstration, Verbal cues, and Handouts Education comprehension: verbalized understanding and needs further education    HOME EXERCISE PROGRAM: Access Code: [email or web address]: [email or web address] Date: 04/19/2022 Prepared by: BElsie Ra Exercises - Sit to Stand with Armchair  - 2 x daily - 7 x weekly - 2 sets - 5 reps - Seated Straight Leg Raise with Quad Contraction  - 2 x daily - 6 x weekly - 1-2 sets - 10 reps - Single Leg Knee Extension with Weight Machine  - 1 x daily - 2-3 x weekly - 2 sets - 10-20 reps - Full Leg Press with  Resistance Around Knees  - 1 x daily - 2-3 x weekly - 2-3 sets - 10 reps - Single Leg Hamstring Curl with Weight Machine  - 1 x daily - 2-3 x weekly - 2-3 sets - 10 reps - Tandem Stance  - 2 x daily - 7 x weekly - 1 sets - 4-5 reps - 20 second hold - Side Stepping with Counter Support  - 2 x daily - 6 x weekly - 1 sets - 3-5 reps - Backward Walking with Counter Support  - 2 x daily - 6 x weekly - 1 sets - 3-5 reps - Walking with Head Rotation  - 2 x daily - 6 x weekly - 1 sets - 3-5 reps   ASSESSMENT:   CLINICAL IMPRESSION: She showed improvements with updated knee extension strength measurement on Rt side. However still with some weakness compared to her left and she will continue to benefit from PT for leg strength and balance to reduce risk of falling    OBJECTIVE IMPAIRMENTS: decreased activity tolerance, difficulty walking, decreased balance, decreased endurance, decreased mobility, decreased ROM, decreased strength, impaired flexibility, impaired UE/LE use, postural dysfunction, and pain.   ACTIVITY LIMITATIONS: bending, lifting, carry, locomotion, cleaning, community activity PERSONAL FACTORS: HTN, R sciatica, bilat TKA are also affecting patient's functional outcome.   REHAB POTENTIAL: Good   CLINICAL DECISION MAKING: Stable/uncomplicated   EVALUATION COMPLEXITY: Low  GOALS: Short term PT Goals Target date: 05/06/2022 Pt will be I and compliant with HEP. Baseline:  Goal status: On Going 05/02/2022 Pt will decrease 5 times sit to stand test less than 18 seconds (UE push up from knees) Baseline: Goal status: Met 05/02/2022   Long term PT goals Target date: 07/01/2022 Pt will improve  Rt quad strength to at least 35# on hand held dynamometer or 5/5 MMT to improve functional strength Baseline:28.5 Goal status: On Going, now 33# 05/09/2022 Pt will improve FOTO to at least 50% functional to show improved function Baseline: Goal status: ongoing Pt will reduce pain by  overall 50% overall with usual activity Baseline: Goal status: ongoing Pt will report decreased number of falls since starting PT Baseline: Goal status: ongoing, has not fallen since starting PT back Pt will improve 5 times sit to stand test to less than 15 seconds with UE push off from knees Baseline: Goal status: ongoing   PLAN: PT FREQUENCY: 1-2 times per week    PT DURATION:8-12 weeks   PLANNED INTERVENTIONS (unless contraindicated): aquatic PT, Canalith repositioning, cryotherapy, Electrical stimulation, Iontophoresis with 4 mg/ml dexamethasome, Moist heat, traction, Ultrasound, gait training, Therapeutic exercise, balance training, neuromuscular re-education, patient/family education, prosthetic training, manual techniques, passive ROM, dry needling, taping, vasopnuematic device, vestibular, spinal manipulations, joint manipulations   PLAN FOR NEXT SESSION: Dynamic and static balance, functional strength, quadriceps strength and endurance, work on gym equipment and encourage her to join a gym as we transition to an independent program.    Debbe Odea, PT, DPT 05/09/2022, 12:01 PM

## 2022-05-14 ENCOUNTER — Encounter: Payer: Self-pay | Admitting: Physical Therapy

## 2022-05-14 ENCOUNTER — Ambulatory Visit: Payer: Medicare PPO | Admitting: Physical Therapy

## 2022-05-14 DIAGNOSIS — M6281 Muscle weakness (generalized): Secondary | ICD-10-CM

## 2022-05-14 DIAGNOSIS — M25561 Pain in right knee: Secondary | ICD-10-CM

## 2022-05-14 DIAGNOSIS — R600 Localized edema: Secondary | ICD-10-CM

## 2022-05-14 DIAGNOSIS — R262 Difficulty in walking, not elsewhere classified: Secondary | ICD-10-CM

## 2022-05-14 DIAGNOSIS — M25661 Stiffness of right knee, not elsewhere classified: Secondary | ICD-10-CM | POA: Diagnosis not present

## 2022-05-14 NOTE — Therapy (Signed)
OUTPATIENT PHYSICAL THERAPY TREATMENT NOTE   Patient Name: Leslie [name] MRN: [id number] DOB:02-Nov-1950, 71 y.o., female Today's Date: 05/14/2022  END OF SESSION:   PT End of Session - 05/14/22 0936     Visit Number 6    Number of Visits 12    Date for PT Re-Evaluation 07/01/22    Authorization Type [employer] M[employer] until 11/17    Authorization - Number of Visits 12    Progress Note Due on Visit 10    PT Start Time 0935    PT Stop Time 0100    PT Time Calculation (min) 40 min    Activity Tolerance Patient tolerated treatment well;Patient limited by fatigue    Behavior During Therapy [facility] for tasks assessed/performed              Past Medical History:  Diagnosis Date   ADD (attention deficit disorder)    Allergy    Anemia    Arthritis 07/22/2014   osteoarthritis left knee   Colon polyps    Hypertension    Left knee pain    chronic   Narcotic addiction ([place])    Obesity    post gastric bypass   Vitamin D deficiency    Past Surgical History:  Procedure Laterality Date   arthscopic knee surgery Left    several times   BREAST BIOPSY     BREAST SURGERY  years ago   breast biopsy   CHOLECYSTECTOMY     EYE SURGERY Bilateral 2013   Toric Lens implants   EYE SURGERY Right 2014   corneal amniotic membrane   GASTRIC BYPASS  2003   KIDNEY DONATION  2004   TOTAL KNEE ARTHROPLASTY Left 06/16/2015   Procedure: LEFT TOTAL KNEE ARTHROPLASTY;  Surgeon: Mcarthur Rossetti, MD;  Location: [facility];  Service: Orthopedics;  Laterality: Left;   TOTAL KNEE ARTHROPLASTY Right 05/04/2021   Procedure: RIGHT TOTAL KNEE ARTHROPLASTY;  Surgeon: Mcarthur Rossetti, MD;  Location: [facility];  Service: Orthopedics;  Laterality: Right;   Patient Active Problem List   Diagnosis Date Noted   Status post right knee replacement 05/04/2021   Vitamin B12 deficiency 03/14/2021   Macrocytosis without anemia 03/14/2021   Vitamin D deficiency 02/17/2020   Sleep disorder 07/10/2018   ADHD  (attention deficit hyperactivity disorder) 10/01/2017   Unilateral primary osteoarthritis, right knee 08/20/2017   Chronic pain of both shoulders 08/20/2017   Complete tear of right rotator cuff 04/29/2017   Osteoarthritis of left knee 06/16/2015   Status post total left knee replacement 06/16/2015   Attention deficit disorder 09/14/2007   COLONIC POLYPS, HX OF 08/03/2007   Essential hypertension 01/20/2007   OBESITY NOS 12/04/2006   Allergic rhinitis 12/04/2006   GASTROJEJUNOSTOMY, HX OF 12/04/2006   BREAST BIOPSY, HX OF 12/04/2006     THERAPY DIAG:  Difficulty walking  Muscle weakness (generalized)  Right knee pain, unspecified chronicity  Localized edema  Stiffness of right knee, not elsewhere classified  PCP: Isaac Bliss, Rayford Halsted, MD   REFERRING PROVIDER: Pete Pelt, PA-C   REFERRING DIAG: [phone] (ICD-10-CM) - Status post right knee replacement [phone] (ICD-10-CM) - History of total left knee replacement    ONSET DATE:  Right total knee replacement on 05/04/21.   SUBJECTIVE:    SUBJECTIVE STATEMENT: She says not really having pain today upon arrival. She does have some soreness to report. She says she had 2 days last week when her legs did not shake.   PERTINENT HISTORY: HTN,  R sciatica, bilat TKA   PAIN:  Are you having pain? Yes: NPRS scale: 0-1/10 this week Pain location: Rt medial knee Pain description: throbbing Aggravating factors: standing/walking more than 15 min,  Relieving factors: NSAIDs, ice helps with the swelling   PRECAUTIONS: Fall   WEIGHT BEARING RESTRICTIONS No   FALLS:  Has patient fallen in last 6 months? Yes. Number of falls 8 and has fear of falling   LIVING ENVIRONMENT: No stairs at her house.   OCCUPATION: retired   PLOF: Independent   PATIENT GOALS : improve strength and decrease falls     OBJECTIVE:    DIAGNOSTIC FINDINGS:  Imaging: XR Knee 1-2 Views Right   Result Date: 03/11/2022 Right knee 2  views: He is well located.  No acute fractures or bony abnormalities.  Status post right total knee arthroplasty with well-seated components.   XR Knee 1-2 Views Left   Result Date: 03/11/2022 Left knee 2 views: No acute fractures.  Status post left total knee arthroplasty well-seated components.  Knee is well located.    PATIENT SURVEYS:  FOTO Eval 40% functional, goal is 50%   COGNITION:           Overall cognitive status: Within functional limits for tasks assessed                          SENSATION: WFL   EDEMA:  Mild to moderate edema in left knee   MUSCLE LENGTH:     POSTURE:    PALPATION:     LOWER EXTREMITY ROM:   Active ROM/PROM Right eval Left eval  Hip flexion      Hip extension      Hip abduction      Hip adduction      Hip internal rotation      Hip external rotation      Knee flexion 120/    Knee extension 2/0    Ankle dorsiflexion      Ankle plantarflexion      Ankle inversion      Ankle eversion       (Blank rows = not tested)   LOWER EXTREMITY MMT:   MMT Right eval Left eval Right 05/09/22  Hip flexion 3 4+   Hip extension       Hip abduction       Hip adduction       Hip internal rotation       Hip external rotation       Knee flexion 4- 4   Knee extension 4/5 28.5# 4+ 37# 4/5 33#  Ankle dorsiflexion       Ankle plantarflexion       Ankle inversion       Ankle eversion        (Blank rows = not tested)   LOWER EXTREMITY SPECIAL TESTS:      FUNCTIONAL TESTS:  5 times sit to stand: 20.9   GAIT:eval Comments: limited community Ambulator, does not use AD and has unsteady gait.    TODAY'S TREATMENT:  05/14/22 -Recumbent bike seat 5 for 10 min L2 -Standing slantboard stretch 3 X 30 sec -Standing heel and toe raises  X15 each -Sit to stands slow eccentric lower no UE support X 10 -Seated SLR X 10 each leg -Double leg press 75# 2X10 -Single Leg Press Rt and Lt X 1  each 50# slow eccentrics -Feet together eyes closed 2X 30  seconds and tandem balance  eyes open 4X 30 seconds each  05/09/22 -Recumbent bike seat 5 for 10 min L2 -Sit to stands slow eccentric lower no UE support X 10 -Seated LAQ with green band X 20 each leg, provided band for her to take home and add this into HEP -Seated SLR X 10 each leg -Double leg press 75# X 15 -Single Leg Press Rt and Lt 2 sets of 10 each 50# slow eccentrics -Feet together eyes closed 2X 30 seconds and tandem balance eyes open 4X 30 seconds each  PATIENT EDUCATION: Education details: HEP, PT plan of care Person educated: Patient Education method: Explanation, Demonstration, Verbal cues, and Handouts Education comprehension: verbalized understanding and needs further education    HOME EXERCISE PROGRAM: Access Code: [email or web address] URL: [email or web address] Date: 04/19/2022 Prepared by: Elsie Ra  Exercises - Sit to Stand with Armchair  - 2 x daily - 7 x weekly - 2 sets - 5 reps - Seated Straight Leg Raise with Quad Contraction  - 2 x daily - 6 x weekly - 1-2 sets - 10 reps - Single Leg Knee Extension with Weight Machine  - 1 x daily - 2-3 x weekly - 2 sets - 10-20 reps - Full Leg Press with Resistance Around Knees  - 1 x daily - 2-3 x weekly - 2-3 sets - 10 reps - Single Leg Hamstring Curl with Weight Machine  - 1 x daily - 2-3 x weekly - 2-3 sets - 10 reps - Tandem Stance  - 2 x daily - 7 x weekly - 1 sets - 4-5 reps - 20 second hold - Side Stepping with Counter Support  - 2 x daily - 6 x weekly - 1 sets - 3-5 reps - Backward Walking with Counter Support  - 2 x daily - 6 x weekly - 1 sets - 3-5 reps - Walking with Head Rotation  - 2 x daily - 6 x weekly - 1 sets - 3-5 reps   ASSESSMENT:   CLINICAL IMPRESSION: We continued to work to improve her overall leg strength and balance. She did appear more steady with ambulation today. PT recommending to continue current plan of care.   OBJECTIVE IMPAIRMENTS: decreased activity tolerance, difficulty walking,  decreased balance, decreased endurance, decreased mobility, decreased ROM, decreased strength, impaired flexibility, impaired UE/LE use, postural dysfunction, and pain.   ACTIVITY LIMITATIONS: bending, lifting, carry, locomotion, cleaning, community activity PERSONAL FACTORS: HTN, R sciatica, bilat TKA are also affecting patient's functional outcome.   REHAB POTENTIAL: Good   CLINICAL DECISION MAKING: Stable/uncomplicated   EVALUATION COMPLEXITY: Low       GOALS: Short term PT Goals Target date: 05/06/2022 Pt will be I and compliant with HEP. Baseline:  Goal status: On Going 05/02/2022 Pt will decrease 5 times sit to stand test less than 18 seconds (UE push up from knees) Baseline: Goal status: Met 05/02/2022   Long term PT goals Target date: 07/01/2022 Pt will improve  Rt quad strength to at least 35# on hand held dynamometer or 5/5 MMT to improve functional strength Baseline:28.5 Goal status: On Going, now 33# 05/09/2022 Pt will improve FOTO to at least 50% functional to show improved function Baseline: Goal status: ongoing Pt will reduce pain by overall 50% overall with usual activity Baseline: Goal status: ongoing Pt will report decreased number of falls since starting PT Baseline: Goal status: ongoing, has not fallen since starting PT back Pt will improve 5 times sit to stand test to less than 15 seconds with UE push  off from knees Baseline: Goal status: ongoing   PLAN: PT FREQUENCY: 1-2 times per week    PT DURATION:8-12 weeks   PLANNED INTERVENTIONS (unless contraindicated): aquatic PT, Canalith repositioning, cryotherapy, Electrical stimulation, Iontophoresis with 4 mg/ml dexamethasome, Moist heat, traction, Ultrasound, gait training, Therapeutic exercise, balance training, neuromuscular re-education, patient/family education, prosthetic training, manual techniques, passive ROM, dry needling, taping, vasopnuematic device, vestibular, spinal manipulations, joint  manipulations   PLAN FOR NEXT SESSION: Dynamic and static balance, functional strength, quadriceps strength and endurance, work on gym equipment and encourage her to join a gym as we transition to an independent program.    Debbe Odea, PT, DPT 05/14/2022, 10:27 AM

## 2022-05-16 ENCOUNTER — Encounter: Payer: Medicare PPO | Admitting: Rehabilitative and Restorative Service Providers"

## 2022-05-21 ENCOUNTER — Encounter: Payer: Medicare PPO | Admitting: Physical Therapy

## 2022-05-23 ENCOUNTER — Ambulatory Visit: Payer: Medicare PPO | Admitting: Rehabilitative and Restorative Service Providers"

## 2022-05-23 ENCOUNTER — Encounter: Payer: Self-pay | Admitting: Rehabilitative and Restorative Service Providers"

## 2022-05-23 DIAGNOSIS — R600 Localized edema: Secondary | ICD-10-CM | POA: Diagnosis not present

## 2022-05-23 DIAGNOSIS — M6281 Muscle weakness (generalized): Secondary | ICD-10-CM

## 2022-05-23 DIAGNOSIS — M25561 Pain in right knee: Secondary | ICD-10-CM | POA: Diagnosis not present

## 2022-05-23 DIAGNOSIS — R262 Difficulty in walking, not elsewhere classified: Secondary | ICD-10-CM

## 2022-05-23 NOTE — Therapy (Addendum)
OUTPATIENT PHYSICAL THERAPY TREATMENT NOTE/Discharge  PHYSICAL THERAPY DISCHARGE SUMMARY  Visits from Start of Care:   Current functional level related to goals / functional outcomes: See below   Remaining deficits: See below   Education / Equipment: HEP  Plan:  Patient goals were not met. Patient is being discharged due to not returning since last visit >30 days Leslie [name], PT, DPT 07/03/22 10:35 AM       Patient Name: Leslie [name] MRN: [id number] DOB:Mar 06, 1951, 71 y.o., female, female Today's Date: 05/23/2022  END OF SESSION:   PT End of Session - 05/23/22 1449     Visit Number 7    Number of Visits 12    Date for PT Re-Evaluation 07/01/22    Authorization Type [employer] M[employer] until 11/17    Authorization - Number of Visits 12    Progress Note Due on Visit 10    PT Start Time 1100    PT Stop Time 1145    PT Time Calculation (min) 45 min    Activity Tolerance Patient tolerated treatment well;Patient limited by fatigue    Behavior During Therapy [facility] for tasks assessed/performed               Past Medical History:  Diagnosis Date   ADD (attention deficit disorder)    Allergy    Anemia    Arthritis 07/22/2014   osteoarthritis left knee   Colon polyps    Hypertension    Left knee pain    chronic   Narcotic addiction ([place])    Obesity    post gastric bypass   Vitamin D deficiency    Past Surgical History:  Procedure Laterality Date   arthscopic knee surgery Left    several times   BREAST BIOPSY     BREAST SURGERY  years ago   breast biopsy   CHOLECYSTECTOMY     EYE SURGERY Bilateral 2013   Toric Lens implants   EYE SURGERY Right 2014   corneal amniotic membrane   GASTRIC BYPASS  2003   KIDNEY DONATION  2004   TOTAL KNEE ARTHROPLASTY Left 06/16/2015   Procedure: LEFT TOTAL KNEE ARTHROPLASTY;  Surgeon: Mcarthur Rossetti, MD;  Location: [facility];  Service: Orthopedics;  Laterality: Left;   TOTAL KNEE ARTHROPLASTY Right 05/04/2021   Procedure:  RIGHT TOTAL KNEE ARTHROPLASTY;  Surgeon: Mcarthur Rossetti, MD;  Location: [facility];  Service: Orthopedics;  Laterality: Right;   Patient Active Problem List   Diagnosis Date Noted   Status post right knee replacement 05/04/2021   Vitamin B12 deficiency 03/14/2021   Macrocytosis without anemia 03/14/2021   Vitamin D deficiency 02/17/2020   Sleep disorder 07/10/2018   ADHD (attention deficit hyperactivity disorder) 10/01/2017   Unilateral primary osteoarthritis, right knee 08/20/2017   Chronic pain of both shoulders 08/20/2017   Complete tear of right rotator cuff 04/29/2017   Osteoarthritis of left knee 06/16/2015   Status post total left knee replacement 06/16/2015   Attention deficit disorder 09/14/2007   COLONIC POLYPS, HX OF 08/03/2007   Essential hypertension 01/20/2007   OBESITY NOS 12/04/2006   Allergic rhinitis 12/04/2006   GASTROJEJUNOSTOMY, HX OF 12/04/2006   BREAST BIOPSY, HX OF 12/04/2006     THERAPY DIAG:  Difficulty walking  Muscle weakness (generalized)  Right knee pain, unspecified chronicity  Localized edema  PCP: Isaac Bliss, Rayford Halsted, MD   REFERRING PROVIDER: Pete Pelt, PA-C   REFERRING DIAG: [phone] (ICD-10-CM) - Status post right knee replacement [phone] (ICD-10-CM) -  History of total left knee replacement    ONSET DATE:  Right total knee replacement on 05/04/21.   SUBJECTIVE:    SUBJECTIVE STATEMENT: Leslie [name] is very interested in improving balance and strength.  She reports falling at church 7-8 days ago and has a R eye bruise as a result.  PERTINENT HISTORY: HTN, R sciatica, bilat TKA   PAIN:  Are you having pain? Yes: NPRS scale: 0-1/10 this week Pain location: Rt medial knee Pain description: throbbing Aggravating factors: standing/walking more than 15 min Relieving factors: NSAIDs, ice helps with the swelling   PRECAUTIONS: Fall   WEIGHT BEARING RESTRICTIONS No   FALLS:  Has patient fallen in last 6 months?  Yes. Number of falls 8 and has fear of falling   LIVING ENVIRONMENT: No stairs at her house.   OCCUPATION: retired   PLOF: Independent   PATIENT GOALS : improve strength and decrease falls     OBJECTIVE:    DIAGNOSTIC FINDINGS:  Imaging: XR Knee 1-2 Views Right   Result Date: 03/11/2022 Right knee 2 views: He is well located.  No acute fractures or bony abnormalities.  Status post right total knee arthroplasty with well-seated components.   XR Knee 1-2 Views Left   Result Date: 03/11/2022 Left knee 2 views: No acute fractures.  Status post left total knee arthroplasty well-seated components.  Knee is well located.    PATIENT SURVEYS:  FOTO Eval 40% functional, goal is 50%   COGNITION:           Overall cognitive status: Within functional limits for tasks assessed                          SENSATION: WFL   EDEMA:  Mild to moderate edema in left knee   MUSCLE LENGTH:     POSTURE:    PALPATION:     LOWER EXTREMITY ROM:   Active ROM/PROM Right eval Left eval  Hip flexion      Hip extension      Hip abduction      Hip adduction      Hip internal rotation      Hip external rotation      Knee flexion 120/    Knee extension 2/0    Ankle dorsiflexion      Ankle plantarflexion      Ankle inversion      Ankle eversion       (Blank rows = not tested)   LOWER EXTREMITY MMT:   MMT Right eval Left eval Right 05/09/22  Hip flexion 3 4+   Hip extension       Hip abduction       Hip adduction       Hip internal rotation       Hip external rotation       Knee flexion 4- 4   Knee extension 4/5 28.5# 4+ 37# 4/5 33#  Ankle dorsiflexion       Ankle plantarflexion       Ankle inversion       Ankle eversion        (Blank rows = not tested)   LOWER EXTREMITY SPECIAL TESTS:      FUNCTIONAL TESTS:  5 times sit to stand: 20.9   GAIT:eval Comments: limited community Ambulator, does not use AD and has unsteady gait.    TODAY'S TREATMENT:   05/23/2022  Neuromuscular re-education: Tandem balance working wide to narrow 7X 30 seconds  Bil Heel to toe raises with no hands 20X 3 seconds Perturbations on compliant surface 2X 1 minute  Functional Activities for stairs and sit to stand: Step-up and over 6 and 8 inch step slow eccentrics no hands 12X each Sit to stand slow eccentrics 2 sets of 5 360 turns Bil 10 total Leg Press single leg 50X 10 reps Bil slow eccentrics   05/14/22 -Recumbent bike seat 5 for 10 min L2 -Standing slantboard stretch 3 X 30 sec -Standing heel and toe raises  X15 each -Sit to stands slow eccentric lower no UE support X 10 -Seated SLR X 10 each leg -Double leg press 75# 2X10 -Single Leg Press Rt and Lt X 1  each 50# slow eccentrics -Feet together eyes closed 2X 30 seconds and tandem balance eyes open 4X 30 seconds each   05/09/22 -Recumbent bike seat 5 for 10 min L2 -Sit to stands slow eccentric lower no UE support X 10 -Seated LAQ with green band X 20 each leg, provided band for her to take home and add this into HEP -Seated SLR X 10 each leg -Double leg press 75# X 15 -Single Leg Press Rt and Lt 2 sets of 10 each 50# slow eccentrics -Feet together eyes closed 2X 30 seconds and tandem balance eyes open 4X 30 seconds each  PATIENT EDUCATION: Education details: HEP, PT plan of care Person educated: Patient Education method: Explanation, Demonstration, Verbal cues, and Handouts Education comprehension: verbalized understanding and needs further education    HOME EXERCISE PROGRAM: Access Code: [email or web address] URL: [email or web address] Date: 04/19/2022 Prepared by: Leslie [name]  Exercises - Sit to Stand with Armchair  - 2 x daily - 7 x weekly - 2 sets - 5 reps - Seated Straight Leg Raise with Quad Contraction  - 2 x daily - 6 x weekly - 1-2 sets - 10 reps - Single Leg Knee Extension with Weight Machine  - 1 x daily - 2-3 x weekly - 2 sets - 10-20 reps - Full Leg Press with  Resistance Around Knees  - 1 x daily - 2-3 x weekly - 2-3 sets - 10 reps - Single Leg Hamstring Curl with Weight Machine  - 1 x daily - 2-3 x weekly - 2-3 sets - 10 reps - Tandem Stance  - 2 x daily - 7 x weekly - 1 sets - 4-5 reps - 20 second hold - Side Stepping with Counter Support  - 2 x daily - 6 x weekly - 1 sets - 3-5 reps - Backward Walking with Counter Support  - 2 x daily - 6 x weekly - 1 sets - 3-5 reps - Walking with Head Rotation  - 2 x daily - 6 x weekly - 1 sets - 3-5 reps   ASSESSMENT:   CLINICAL IMPRESSION: Balance and strength both need continued work.  Justeen has fallen in the past week and has a good size Rt eye bruise as a result.  Fatigue and muscle shaking is present by 35-40 minutes into her treatment and she will continue to benefit from the recommended course of supervised PT along with consistent HEP compliance.  OBJECTIVE IMPAIRMENTS: decreased activity tolerance, difficulty walking, decreased balance, decreased endurance, decreased mobility, decreased ROM, decreased strength, impaired flexibility, impaired UE/LE use, postural dysfunction, and pain.   ACTIVITY LIMITATIONS: bending, lifting, carry, locomotion, cleaning, community activity PERSONAL FACTORS: HTN, R sciatica, bilat TKA are also affecting patient's functional outcome.   REHAB POTENTIAL: Good   CLINICAL DECISION MAKING: Stable/uncomplicated  EVALUATION COMPLEXITY: Low       GOALS: Short term PT Goals Target date: 05/06/2022 Pt will be I and compliant with HEP. Baseline:  Goal status: On Going 05/23/2022 Pt will decrease 5 times sit to stand test less than 18 seconds (UE push up from knees) Baseline: Goal status: Met 05/02/2022   Long term PT goals Target date: 07/01/2022 Pt will improve  Rt quad strength to at least 35# on hand held dynamometer or 5/5 MMT to improve functional strength Baseline:28.5 Goal status: On Going, now 33# 05/09/2022 Pt will improve FOTO to at least 50% functional to  show improved function Baseline: Goal status: ongoing Pt will reduce pain by overall 50% overall with usual activity Baseline: Goal status: ongoing 05/23/2022 Pt will report decreased number of falls since starting PT Baseline: Goal status: ongoing, fallen 1X (last week) since starting PT back 05/23/2022 Pt will improve 5 times sit to stand test to less than 15 seconds with UE push off from knees Baseline: Goal status: ongoing 05/23/2022   PLAN: PT FREQUENCY: 1-2 times per week    PT DURATION:8-12 weeks   PLANNED INTERVENTIONS (unless contraindicated): aquatic PT, Canalith repositioning, cryotherapy, Electrical stimulation, Iontophoresis with 4 mg/ml dexamethasome, Moist heat, traction, Ultrasound, gait training, Therapeutic exercise, balance training, neuromuscular re-education, patient/family education, prosthetic training, manual techniques, passive ROM, dry needling, taping, vasopnuematic device, vestibular, spinal manipulations, joint manipulations   PLAN FOR NEXT SESSION: Dynamic and static balance, functional strength, quadriceps strength and endurance, work on gym equipment and encourage her to join a gym as we transition to an independent program.  Engineer, materials, possible Actuary.    Farley Ly, PT, MPT 05/23/2022, 2:55 PM

## 2022-06-03 ENCOUNTER — Encounter: Payer: Medicare PPO | Admitting: Physical Therapy

## 2022-06-04 ENCOUNTER — Other Ambulatory Visit: Payer: Self-pay | Admitting: Internal Medicine

## 2022-06-04 DIAGNOSIS — F900 Attention-deficit hyperactivity disorder, predominantly inattentive type: Secondary | ICD-10-CM

## 2022-06-04 DIAGNOSIS — G479 Sleep disorder, unspecified: Secondary | ICD-10-CM

## 2022-06-04 NOTE — Telephone Encounter (Signed)
Please deny.  Patient needs an appointment.

## 2022-06-06 ENCOUNTER — Other Ambulatory Visit: Payer: Self-pay | Admitting: Internal Medicine

## 2022-06-06 DIAGNOSIS — F900 Attention-deficit hyperactivity disorder, predominantly inattentive type: Secondary | ICD-10-CM

## 2022-06-06 MED ORDER — AMPHETAMINE-DEXTROAMPHET ER 30 MG PO CP24: 30.0000 mg | ORAL_CAPSULE | ORAL | 0 refills | Status: DC

## 2022-06-06 MED ORDER — AMPHETAMINE-DEXTROAMPHETAMINE 20 MG PO TABS: 20.0000 mg | ORAL_TABLET | Freq: Every day | ORAL | 0 refills | Status: DC

## 2022-06-06 NOTE — Telephone Encounter (Signed)
Refill sent.

## 2022-06-06 NOTE — Telephone Encounter (Signed)
Patient has scheduled an appointment 06/11/22

## 2022-06-06 NOTE — Telephone Encounter (Addendum)
amphetamine-dextroamphetamine (ADDERALL) 20 MG tablet   amphetamine-dextroamphetamine (ADDERALL XR) 30 MG 24 hr capsule   [id number] [place], [place] - [address] AT [facility] OF [address] & [facility] [address] Phone: [phone]  Fax: [phone]     LOV:  01/24/22 Next OV: 06/11/22

## 2022-06-11 ENCOUNTER — Ambulatory Visit: Payer: Medicare PPO | Admitting: Internal Medicine

## 2022-06-11 ENCOUNTER — Encounter: Payer: Medicare PPO | Admitting: Physical Therapy

## 2022-06-17 ENCOUNTER — Encounter: Payer: Self-pay | Admitting: Internal Medicine

## 2022-06-17 ENCOUNTER — Ambulatory Visit: Payer: Medicare PPO | Admitting: Internal Medicine

## 2022-06-17 VITALS — BP 130/88 | HR 82 | Temp 98.1°F | Wt 196.8 lb

## 2022-06-17 DIAGNOSIS — Z1211 Encounter for screening for malignant neoplasm of colon: Secondary | ICD-10-CM | POA: Diagnosis not present

## 2022-06-17 DIAGNOSIS — Z1231 Encounter for screening mammogram for malignant neoplasm of breast: Secondary | ICD-10-CM

## 2022-06-17 DIAGNOSIS — F900 Attention-deficit hyperactivity disorder, predominantly inattentive type: Secondary | ICD-10-CM | POA: Diagnosis not present

## 2022-06-17 DIAGNOSIS — I10 Essential (primary) hypertension: Secondary | ICD-10-CM

## 2022-06-17 MED ORDER — HYDROCHLOROTHIAZIDE 25 MG PO TABS: ORAL_TABLET | ORAL | 1 refills | Status: DC

## 2022-06-17 NOTE — Progress Notes (Signed)
Established Patient Office Visit     CC/Reason for Visit: Follow-up chronic conditions  HPI: Leslie [name] is a 71 y.o. female who is coming in today for the above mentioned reasons. Past Medical History is significant for: Hypertension, depression and ADHD.  She is here today for medication refills.  She is overdue for CPE.  It is time we update her breast and colon cancer screening.   Past Medical/Surgical History: Past Medical History:  Diagnosis Date   ADD (attention deficit disorder)    Allergy    Anemia    Arthritis 07/22/2014   osteoarthritis left knee   Colon polyps    Hypertension    Left knee pain    chronic   Narcotic addiction ([facility])    Obesity    post gastric bypass   Vitamin D deficiency     Past Surgical History:  Procedure Laterality Date   arthscopic knee surgery Left    several times   BREAST BIOPSY     BREAST SURGERY  years ago   breast biopsy   CHOLECYSTECTOMY     EYE SURGERY Bilateral 2013   Toric Lens implants   EYE SURGERY Right 2014   corneal amniotic membrane   GASTRIC BYPASS  2003   KIDNEY DONATION  2004   TOTAL KNEE ARTHROPLASTY Left 06/16/2015   Procedure: LEFT TOTAL KNEE ARTHROPLASTY;  Surgeon: Kathryne Hitch, MD;  Location: [facility];  Service: Orthopedics;  Laterality: Left;   TOTAL KNEE ARTHROPLASTY Right 05/04/2021   Procedure: RIGHT TOTAL KNEE ARTHROPLASTY;  Surgeon: Kathryne Hitch, MD;  Location: [facility];  Service: Orthopedics;  Laterality: Right;    Social History:  reports that she quit smoking about 29 years ago. Her smoking use included cigarettes. She has never used smokeless tobacco. She reports current alcohol use. She reports that she does not use drugs.  Allergies: Allergies  Allergen Reactions   Amoxicillin     Hives   Sulfamethoxazole Nausea And Vomiting    See patient list of med intolerances due to h/o gastric bypass and nephrectomy    Family History:  Family History  Problem Relation  Age of Onset   Heart disease Mother        post CABG history of CHF   COPD Father    Diabetes Sister    Hypertension Sister        post renal transplant   Heart disease Brother        CAD     Current Outpatient Medications:    ALPRAZolam (XANAX) 0.25 MG tablet, TAKE 1 TABLET(0.25 MG) BY MOUTH AT BEDTIME AS NEEDED FOR ANXIETY, Disp: 30 tablet, Rfl: 2   amphetamine-dextroamphetamine (ADDERALL XR) 30 MG 24 hr capsule, Take 1 capsule (30 mg total) by mouth every morning., Disp: 30 capsule, Rfl: 0   amphetamine-dextroamphetamine (ADDERALL XR) 30 MG 24 hr capsule, Take 1 capsule (30 mg total) by mouth every morning., Disp: 30 capsule, Rfl: 0   amphetamine-dextroamphetamine (ADDERALL XR) 30 MG 24 hr capsule, Take 1 capsule (30 mg total) by mouth every morning., Disp: 30 capsule, Rfl: 0   amphetamine-dextroamphetamine (ADDERALL XR) 30 MG 24 hr capsule, Take 1 capsule (30 mg total) by mouth every morning., Disp: 30 capsule, Rfl: 0   amphetamine-dextroamphetamine (ADDERALL) 10 MG tablet, Take 1 tablet (10 mg total) by mouth 2 (two) times daily., Disp: 60 tablet, Rfl: 0   amphetamine-dextroamphetamine (ADDERALL) 20 MG tablet, Take 1/2 to 1 tablet by mouth every evening  if needed., Disp: 10 tablet, Rfl: 0   amphetamine-dextroamphetamine (ADDERALL) 20 MG tablet, Take 1 tablet (20 mg total) by mouth daily., Disp: 30 tablet, Rfl: 0   amphetamine-dextroamphetamine (ADDERALL) 20 MG tablet, Take 1 tablet (20 mg total) by mouth daily., Disp: 30 tablet, Rfl: 0   amphetamine-dextroamphetamine (ADDERALL) 20 MG tablet, Take 1 tablet (20 mg total) by mouth daily., Disp: 30 tablet, Rfl: 0   FLUoxetine (PROZAC) 40 MG capsule, TAKE 1 CAPSULE(40 MG) BY MOUTH EVERY MORNING, Disp: 90 capsule, Rfl: 0   gabapentin (NEURONTIN) 100 MG capsule, TAKE 1 CAPSULE(100 MG) BY MOUTH THREE TIMES DAILY, Disp: 60 capsule, Rfl: 1   lamoTRIgine (LAMICTAL) 100 MG tablet, TAKE 1 TABLET(100 MG) BY MOUTH TWICE DAILY, Disp: 180 tablet, Rfl:  1   tiZANidine (ZANAFLEX) 2 MG tablet, TAKE 1 TABLET(2 MG) BY MOUTH THREE TIMES DAILY, Disp: 90 tablet, Rfl: 0   zolpidem (AMBIEN) 10 MG tablet, TAKE 1 TABLET(10 MG) BY MOUTH AT BEDTIME, Disp: 30 tablet, Rfl: 1   buPROPion (WELLBUTRIN XL) 150 MG 24 hr tablet, Take 1 tablet (150 mg total) by mouth daily. (Patient not taking: Reported on 06/17/2022), Disp: 90 tablet, Rfl: 1   hydrochlorothiazide (HYDRODIURIL) 25 MG tablet, TAKE 1 TABLET(25 MG) BY MOUTH DAILY, Disp: 90 tablet, Rfl: 1  Review of Systems:  Constitutional: Denies fever, chills, diaphoresis, appetite change and fatigue.  HEENT: Denies photophobia, eye pain, redness, hearing loss, ear pain, congestion, sore throat, rhinorrhea, sneezing, mouth sores, trouble swallowing, neck pain, neck stiffness and tinnitus.   Respiratory: Denies SOB, DOE, cough, chest tightness,  and wheezing.   Cardiovascular: Denies chest pain, palpitations and leg swelling.  Gastrointestinal: Denies nausea, vomiting, abdominal pain, diarrhea, constipation, blood in stool and abdominal distention.  Genitourinary: Denies dysuria, urgency, frequency, hematuria, flank pain and difficulty urinating.  Endocrine: Denies: hot or cold intolerance, sweats, changes in hair or nails, polyuria, polydipsia. Musculoskeletal: Denies myalgias, back pain, joint swelling, arthralgias and gait problem.  Skin: Denies pallor, rash and wound.  Neurological: Denies dizziness, seizures, syncope, weakness, light-headedness, numbness and headaches.  Hematological: Denies adenopathy. Easy bruising, personal or family bleeding history  Psychiatric/Behavioral: Denies suicidal ideation, mood changes, confusion, nervousness, sleep disturbance and agitation    Physical Exam: Vitals:   06/17/22 1327  BP: 130/88  Pulse: 82  Temp: 98.1 F (36.7 C)  TempSrc: Oral  SpO2: 98%  Weight: 196 lb 12.8 oz (89.3 kg)    Body mass index is 33.78 kg/m.   Constitutional: NAD, calm,  comfortable Eyes: PERRL, lids and conjunctivae normal ENMT: Mucous membranes are moist.  Respiratory: clear to auscultation bilaterally, no wheezing, no crackles. Normal respiratory effort. No accessory muscle use.  Cardiovascular: Regular rate and rhythm, no murmurs / rubs / gallops. No extremity edema.   Psychiatric: Normal judgment and insight. Alert and oriented x 3. Normal mood.    Impression and Plan:  Encounter for screening mammogram for malignant neoplasm of breast - Plan: MM Digital Screening  Essential hypertension - Plan: hydrochlorothiazide (HYDRODIURIL) 25 MG tablet  Screening for colon cancer - Plan: Cologuard  Attention deficit hyperactivity disorder (ADHD), predominantly inattentive type  -Mammogram and Cologuard have been ordered to update her cancer screening. -Blood pressure is well-controlled, refills of hydrochlorothiazide provided. -PDMP reviewed, no red flags, overdose risk score is 220.  She is not in need of Adderall refills today.  Time spent:30 minutes reviewing chart, interviewing and examining patient and formulating plan of care.      Chaya Jan, MD  [facility]

## 2022-06-18 ENCOUNTER — Encounter: Payer: Medicare PPO | Admitting: Physical Therapy

## 2022-06-18 DIAGNOSIS — H353131 Nonexudative age-related macular degeneration, bilateral, early dry stage: Secondary | ICD-10-CM | POA: Diagnosis not present

## 2022-06-25 ENCOUNTER — Encounter: Payer: Medicare PPO | Admitting: Physical Therapy

## 2022-07-02 ENCOUNTER — Ambulatory Visit (INDEPENDENT_AMBULATORY_CARE_PROVIDER_SITE_OTHER): Payer: Medicare PPO | Admitting: Internal Medicine

## 2022-07-02 ENCOUNTER — Encounter: Payer: Self-pay | Admitting: Internal Medicine

## 2022-07-02 ENCOUNTER — Encounter: Payer: Medicare PPO | Admitting: Physical Therapy

## 2022-07-02 VITALS — BP 110/80 | Temp 97.5°F | Wt 195.6 lb

## 2022-07-02 DIAGNOSIS — H6122 Impacted cerumen, left ear: Secondary | ICD-10-CM | POA: Diagnosis not present

## 2022-07-02 NOTE — Progress Notes (Signed)
Established Patient Office Visit     CC/Reason for Visit: Hearing loss left ear  HPI: Leslie [name] is a 71 y.o. female who is coming in today for the above mentioned reasons.  Last week she had an abrupt loss of hearing of her left ear, she had been using Q-tips prior to this.  No pain, fever or URI symptoms.   Past Medical/Surgical History: Past Medical History:  Diagnosis Date   ADD (attention deficit disorder)    Allergy    Anemia    Arthritis 07/22/2014   osteoarthritis left knee   Colon polyps    Hypertension    Left knee pain    chronic   Narcotic addiction (HCC)    Obesity    post gastric bypass   Vitamin D deficiency     Past Surgical History:  Procedure Laterality Date   arthscopic knee surgery Left    several times   BREAST BIOPSY     BREAST SURGERY  years ago   breast biopsy   CHOLECYSTECTOMY     EYE SURGERY Bilateral 2013   Toric Lens implants   EYE SURGERY Right 2014   corneal amniotic membrane   GASTRIC BYPASS  2003   KIDNEY DONATION  2004   TOTAL KNEE ARTHROPLASTY Left 06/16/2015   Procedure: LEFT TOTAL KNEE ARTHROPLASTY;  Surgeon: Kathryne Hitch, MD;  Location: [facility];  Service: Orthopedics;  Laterality: Left;   TOTAL KNEE ARTHROPLASTY Right 05/04/2021   Procedure: RIGHT TOTAL KNEE ARTHROPLASTY;  Surgeon: Kathryne Hitch, MD;  Location: [facility];  Service: Orthopedics;  Laterality: Right;    Social History:  reports that she quit smoking about 29 years ago. Her smoking use included cigarettes. She has never used smokeless tobacco. She reports current alcohol use. She reports that she does not use drugs.  Allergies: Allergies  Allergen Reactions   Amoxicillin     Hives   Sulfamethoxazole Nausea And Vomiting    See patient list of med intolerances due to h/o gastric bypass and nephrectomy    Family History:  Family History  Problem Relation Age of Onset   Heart disease Mother        post CABG history of CHF    COPD Father    Diabetes Sister    Hypertension Sister        post renal transplant   Heart disease Brother        CAD     Current Outpatient Medications:    ALPRAZolam (XANAX) 0.25 MG tablet, TAKE 1 TABLET(0.25 MG) BY MOUTH AT BEDTIME AS NEEDED FOR ANXIETY, Disp: 30 tablet, Rfl: 2   amphetamine-dextroamphetamine (ADDERALL XR) 30 MG 24 hr capsule, Take 1 capsule (30 mg total) by mouth every morning., Disp: 30 capsule, Rfl: 0   amphetamine-dextroamphetamine (ADDERALL XR) 30 MG 24 hr capsule, Take 1 capsule (30 mg total) by mouth every morning., Disp: 30 capsule, Rfl: 0   amphetamine-dextroamphetamine (ADDERALL XR) 30 MG 24 hr capsule, Take 1 capsule (30 mg total) by mouth every morning., Disp: 30 capsule, Rfl: 0   amphetamine-dextroamphetamine (ADDERALL XR) 30 MG 24 hr capsule, Take 1 capsule (30 mg total) by mouth every morning., Disp: 30 capsule, Rfl: 0   amphetamine-dextroamphetamine (ADDERALL) 10 MG tablet, Take 1 tablet (10 mg total) by mouth 2 (two) times daily., Disp: 60 tablet, Rfl: 0   amphetamine-dextroamphetamine (ADDERALL) 20 MG tablet, Take 1/2 to 1 tablet by mouth every evening if needed., Disp: 10 tablet, Rfl:  0   amphetamine-dextroamphetamine (ADDERALL) 20 MG tablet, Take 1 tablet (20 mg total) by mouth daily., Disp: 30 tablet, Rfl: 0   amphetamine-dextroamphetamine (ADDERALL) 20 MG tablet, Take 1 tablet (20 mg total) by mouth daily., Disp: 30 tablet, Rfl: 0   amphetamine-dextroamphetamine (ADDERALL) 20 MG tablet, Take 1 tablet (20 mg total) by mouth daily., Disp: 30 tablet, Rfl: 0   buPROPion (WELLBUTRIN XL) 150 MG 24 hr tablet, Take 1 tablet (150 mg total) by mouth daily., Disp: 90 tablet, Rfl: 1   FLUoxetine (PROZAC) 40 MG capsule, TAKE 1 CAPSULE(40 MG) BY MOUTH EVERY MORNING, Disp: 90 capsule, Rfl: 0   gabapentin (NEURONTIN) 100 MG capsule, TAKE 1 CAPSULE(100 MG) BY MOUTH THREE TIMES DAILY, Disp: 60 capsule, Rfl: 1   hydrochlorothiazide (HYDRODIURIL) 25 MG tablet, TAKE 1  TABLET(25 MG) BY MOUTH DAILY, Disp: 90 tablet, Rfl: 1   lamoTRIgine (LAMICTAL) 100 MG tablet, TAKE 1 TABLET(100 MG) BY MOUTH TWICE DAILY, Disp: 180 tablet, Rfl: 1   tiZANidine (ZANAFLEX) 2 MG tablet, TAKE 1 TABLET(2 MG) BY MOUTH THREE TIMES DAILY, Disp: 90 tablet, Rfl: 0   zolpidem (AMBIEN) 10 MG tablet, TAKE 1 TABLET(10 MG) BY MOUTH AT BEDTIME, Disp: 30 tablet, Rfl: 1  Review of Systems:  Constitutional: Denies fever, chills, diaphoresis, appetite change and fatigue.  HEENT: Denies photophobia, eye pain, redness,  ear pain, congestion, sore throat, rhinorrhea, sneezing, mouth sores, trouble swallowing, neck pain, neck stiffness and tinnitus.   Respiratory: Denies SOB, DOE, cough, chest tightness,  and wheezing.   Cardiovascular: Denies chest pain, palpitations and leg swelling.  Gastrointestinal: Denies nausea, vomiting, abdominal pain, diarrhea, constipation, blood in stool and abdominal distention.  Genitourinary: Denies dysuria, urgency, frequency, hematuria, flank pain and difficulty urinating.  Endocrine: Denies: hot or cold intolerance, sweats, changes in hair or nails, polyuria, polydipsia. Musculoskeletal: Denies myalgias, back pain, joint swelling, arthralgias and gait problem.  Skin: Denies pallor, rash and wound.  Neurological: Denies dizziness, seizures, syncope, weakness, light-headedness, numbness and headaches.  Hematological: Denies adenopathy. Easy bruising, personal or family bleeding history  Psychiatric/Behavioral: Denies suicidal ideation, mood changes, confusion, nervousness, sleep disturbance and agitation    Physical Exam: Vitals:   07/02/22 1107  BP: 110/80  Temp: (!) 97.5 F (36.4 C)  TempSrc: Oral  Weight: 195 lb 9.6 oz (88.7 kg)    Body mass index is 33.57 kg/m.   Constitutional: NAD, calm, comfortable Eyes: PERRL, lids and conjunctivae normal ENMT: Mucous membranes are moist. Tympanic membrane is pearly white, no erythema or bulging on the right,  left is obstructed by cerumen Psychiatric: Normal judgment and insight. Alert and oriented x 3. Normal mood.    Impression and Plan:  Hearing loss due to cerumen impaction, left  Cerumen Desimpaction  After patient consent was obtained, warm water was applied and gentle ear lavage performed on left ear. There were no complications and following the desimpaction the tympanic membranes were visible. Tympanic membranes are intact following the procedure. Auditory canals are normal. The patient reported relief of symptoms after removal of cerumen.   Time spent:22 minutes reviewing chart, interviewing and examining patient and formulating plan of care.       Chaya Jan, MD [facility]

## 2022-07-03 ENCOUNTER — Other Ambulatory Visit: Payer: Self-pay | Admitting: Internal Medicine

## 2022-07-03 ENCOUNTER — Encounter: Payer: Self-pay | Admitting: Internal Medicine

## 2022-07-03 DIAGNOSIS — F900 Attention-deficit hyperactivity disorder, predominantly inattentive type: Secondary | ICD-10-CM

## 2022-07-03 DIAGNOSIS — F339 Major depressive disorder, recurrent, unspecified: Secondary | ICD-10-CM

## 2022-07-04 MED ORDER — AMPHETAMINE-DEXTROAMPHET ER 30 MG PO CP24: 30.0000 mg | ORAL_CAPSULE | ORAL | 0 refills | Status: DC

## 2022-07-04 MED ORDER — AMPHETAMINE-DEXTROAMPHETAMINE 20 MG PO TABS: 20.0000 mg | ORAL_TABLET | Freq: Every day | ORAL | 0 refills | Status: DC

## 2022-07-09 ENCOUNTER — Encounter: Payer: Medicare PPO | Admitting: Physical Therapy

## 2022-07-17 ENCOUNTER — Telehealth: Payer: Self-pay | Admitting: Physical Medicine and Rehabilitation

## 2022-07-17 ENCOUNTER — Other Ambulatory Visit: Payer: Self-pay | Admitting: Physical Medicine and Rehabilitation

## 2022-07-17 DIAGNOSIS — M5416 Radiculopathy, lumbar region: Secondary | ICD-10-CM

## 2022-07-17 NOTE — Telephone Encounter (Signed)
Patient need an appt to get an injection..[phone]

## 2022-07-23 ENCOUNTER — Telehealth: Payer: Self-pay | Admitting: Physical Medicine and Rehabilitation

## 2022-07-23 NOTE — Telephone Encounter (Signed)
Spoke with patient and informed her that referral is still in process with insurance. Sent a message to Geryl Rankins, RMA to see if she can look into it.

## 2022-07-23 NOTE — Telephone Encounter (Signed)
Patient need an appt ASAP.Marland [phone]

## 2022-07-25 ENCOUNTER — Telehealth: Payer: Self-pay | Admitting: Physical Medicine and Rehabilitation

## 2022-07-25 NOTE — Telephone Encounter (Signed)
Patient is waiting on insurance to approve her injection..she states she has [phone]

## 2022-07-28 ENCOUNTER — Other Ambulatory Visit: Payer: Self-pay | Admitting: Internal Medicine

## 2022-07-30 ENCOUNTER — Ambulatory Visit: Payer: Self-pay

## 2022-07-30 ENCOUNTER — Ambulatory Visit: Payer: Medicare PPO | Admitting: Physical Medicine and Rehabilitation

## 2022-07-30 VITALS — BP 159/100 | HR 86

## 2022-07-30 DIAGNOSIS — M48062 Spinal stenosis, lumbar region with neurogenic claudication: Secondary | ICD-10-CM

## 2022-07-30 DIAGNOSIS — M5416 Radiculopathy, lumbar region: Secondary | ICD-10-CM | POA: Diagnosis not present

## 2022-07-30 MED ORDER — METHYLPREDNISOLONE ACETATE 80 MG/ML IJ SUSP
80.0000 mg | Freq: Once | INTRAMUSCULAR | Status: AC
  Administered 2022-07-30: 80 mg

## 2022-07-30 NOTE — Patient Instructions (Signed)

## 2022-07-30 NOTE — Progress Notes (Signed)
Functional Pain Scale - descriptive words and definitions   Severe (9)  Cannot do any ADL's even with assistance can barely talk/unable to sleep and unable to use distraction. Severe range order  Average Pain 9   +Driver, -BT, -Dye Allergies.  Lower back pain on both sides

## 2022-08-01 ENCOUNTER — Other Ambulatory Visit: Payer: Self-pay | Admitting: Internal Medicine

## 2022-08-01 DIAGNOSIS — G479 Sleep disorder, unspecified: Secondary | ICD-10-CM

## 2022-08-02 ENCOUNTER — Telehealth: Payer: Self-pay | Admitting: Physical Medicine and Rehabilitation

## 2022-08-02 NOTE — Telephone Encounter (Signed)
Patient called. She has some questions. Having shortness of breath.

## 2022-08-05 NOTE — Telephone Encounter (Signed)
Scheduled

## 2022-08-06 ENCOUNTER — Ambulatory Visit (INDEPENDENT_AMBULATORY_CARE_PROVIDER_SITE_OTHER): Payer: Medicare PPO | Admitting: Physical Medicine and Rehabilitation

## 2022-08-06 ENCOUNTER — Encounter: Payer: Self-pay | Admitting: Internal Medicine

## 2022-08-06 ENCOUNTER — Encounter (HOSPITAL_BASED_OUTPATIENT_CLINIC_OR_DEPARTMENT_OTHER): Payer: Self-pay

## 2022-08-06 ENCOUNTER — Emergency Department (HOSPITAL_BASED_OUTPATIENT_CLINIC_OR_DEPARTMENT_OTHER)
Admission: EM | Admit: 2022-08-06 | Discharge: 2022-08-07 | Disposition: A | Discharge status: Home / Self Care | Payer: Medicare PPO | Attending: Emergency Medicine | Admitting: Emergency Medicine

## 2022-08-06 ENCOUNTER — Other Ambulatory Visit: Payer: Self-pay

## 2022-08-06 ENCOUNTER — Emergency Department (HOSPITAL_BASED_OUTPATIENT_CLINIC_OR_DEPARTMENT_OTHER): Payer: Medicare PPO | Admitting: Radiology

## 2022-08-06 DIAGNOSIS — M47816 Spondylosis without myelopathy or radiculopathy, lumbar region: Secondary | ICD-10-CM

## 2022-08-06 DIAGNOSIS — Z1152 Encounter for screening for COVID-19: Secondary | ICD-10-CM | POA: Diagnosis not present

## 2022-08-06 DIAGNOSIS — I509 Heart failure, unspecified: Secondary | ICD-10-CM | POA: Diagnosis not present

## 2022-08-06 DIAGNOSIS — R0602 Shortness of breath: Secondary | ICD-10-CM | POA: Diagnosis not present

## 2022-08-06 DIAGNOSIS — M48062 Spinal stenosis, lumbar region with neurogenic claudication: Secondary | ICD-10-CM

## 2022-08-06 DIAGNOSIS — M5416 Radiculopathy, lumbar region: Secondary | ICD-10-CM

## 2022-08-06 DIAGNOSIS — I110 Hypertensive heart disease with heart failure: Secondary | ICD-10-CM | POA: Diagnosis not present

## 2022-08-06 DIAGNOSIS — J90 Pleural effusion, not elsewhere classified: Secondary | ICD-10-CM | POA: Diagnosis not present

## 2022-08-06 LAB — RESP PANEL BY RT-PCR (RSV, FLU A&B, COVID)  RVPGX2
Influenza A by PCR: NEGATIVE
Influenza B by PCR: NEGATIVE
Resp Syncytial Virus by PCR: NEGATIVE
SARS Coronavirus 2 by RT PCR: NEGATIVE

## 2022-08-06 LAB — COMPREHENSIVE METABOLIC PANEL
ALT: 28 U/L (ref 0–44)
AST: 22 U/L (ref 15–41)
Albumin: 4.2 g/dL (ref 3.5–5.0)
Alkaline Phosphatase: 108 U/L (ref 38–126)
Anion gap: 12 (ref 5–15)
BUN: 21 mg/dL (ref 8–23)
CO2: 21 mmol/L — ABNORMAL LOW (ref 22–32)
Calcium: 9.6 mg/dL (ref 8.9–10.3)
Chloride: 104 mmol/L (ref 98–111)
Creatinine, Ser: 1.04 mg/dL — ABNORMAL HIGH (ref 0.44–1.00)
GFR, Estimated: 57 mL/min — ABNORMAL LOW (ref >60)
Glucose, Bld: 110 mg/dL — ABNORMAL HIGH (ref 70–99)
Potassium: 4.2 mmol/L (ref 3.5–5.1)
Sodium: 137 mmol/L (ref 135–145)
Total Bilirubin: 0.4 mg/dL (ref 0.3–1.2)
Total Protein: 7.1 g/dL (ref 6.5–8.1)

## 2022-08-06 LAB — CBC WITH DIFFERENTIAL/PLATELET
Abs Immature Granulocytes: 0.03 10*3/uL (ref 0.00–0.07)
Basophils Absolute: 0 10*3/uL (ref 0.0–0.1)
Basophils Relative: 0 %
Eosinophils Absolute: 0.2 10*3/uL (ref 0.0–0.5)
Eosinophils Relative: 2 %
HCT: 37.2 % (ref 36.0–46.0)
Hemoglobin: 11.8 g/dL — ABNORMAL LOW (ref 12.0–15.0)
Immature Granulocytes: 0 %
Lymphocytes Relative: 9 %
Lymphs Abs: 0.7 10*3/uL (ref 0.7–4.0)
MCH: 33.2 pg (ref 26.0–34.0)
MCHC: 31.7 g/dL (ref 30.0–36.0)
MCV: 104.8 fL — ABNORMAL HIGH (ref 80.0–100.0)
Monocytes Absolute: 0.4 10*3/uL (ref 0.1–1.0)
Monocytes Relative: 5 %
Neutro Abs: 6.2 10*3/uL (ref 1.7–7.7)
Neutrophils Relative %: 84 %
Platelets: 262 10*3/uL (ref 150–400)
RBC: 3.55 MIL/uL — ABNORMAL LOW (ref 3.87–5.11)
RDW: 14.6 % (ref 11.5–15.5)
WBC: 7.4 10*3/uL (ref 4.0–10.5)
nRBC: 0 % (ref 0.0–0.2)

## 2022-08-06 LAB — D-DIMER, QUANTITATIVE: D-Dimer, Quant: 1 ug/mL-FEU — ABNORMAL HIGH (ref 0.00–0.50)

## 2022-08-06 LAB — TROPONIN I (HIGH SENSITIVITY)
Troponin I (High Sensitivity): 50 ng/L — ABNORMAL HIGH (ref <18)
Troponin I (High Sensitivity): 49 ng/L — ABNORMAL HIGH (ref <18)

## 2022-08-06 MED ORDER — ALPRAZOLAM 0.5 MG PO TABS
0.2500 mg | ORAL_TABLET | Freq: Once | ORAL | Status: AC
  Administered 2022-08-06: 0.25 mg via ORAL
  Filled 2022-08-06: qty 1

## 2022-08-06 MED ORDER — ALBUTEROL SULFATE HFA 108 (90 BASE) MCG/ACT IN AERS: 2.0000 | INHALATION_SPRAY | RESPIRATORY_TRACT | Status: DC | PRN

## 2022-08-06 MED ORDER — FUROSEMIDE 10 MG/ML IJ SOLN
40.0000 mg | Freq: Once | INTRAMUSCULAR | Status: AC
  Administered 2022-08-06: 40 mg via INTRAVENOUS
  Filled 2022-08-06: qty 4

## 2022-08-06 NOTE — Procedures (Signed)
Lumbosacral Transforaminal Epidural Steroid Injection - Sub-Pedicular Approach with Fluoroscopic Guidance  Patient: Leslie [name]      Date of Birth: 20-Nov-1950 MRN: [id number] PCP: Isaac Bliss, Rayford Halsted, MD      Visit Date: 07/30/2022   Universal Protocol:    Date/Time: 07/30/2022  Consent Given By: the patient  Position: PRONE  Additional Comments: Vital signs were monitored before and after the procedure. Patient was prepped and draped in the usual sterile fashion. The correct patient, procedure, and site was verified.   Injection Procedure Details:   Procedure diagnoses: Lumbar radiculopathy [M54.16]    Meds Administered:  Meds ordered this encounter  Medications   methylPREDNISolone acetate (DEPO-MEDROL) injection 80 mg    Laterality: Bilateral  Location/Site: L4  Needle:5.0 in., 22 ga.  Short bevel or Quincke spinal needle  Needle Placement: Transforaminal  Findings:    -Comments: Excellent flow of contrast along the nerve, nerve root and into the epidural space.  Procedure Details: After squaring off the end-plates to get a true AP view, the C-arm was positioned so that an oblique view of the foramen as noted above was visualized. The target area is just inferior to the "nose of the scotty dog" or sub pedicular. The soft tissues overlying this structure were infiltrated with 2-3 ml. of 1% Lidocaine without Epinephrine.  The spinal needle was inserted toward the target using a "trajectory" view along the fluoroscope beam.  Under AP and lateral visualization, the needle was advanced so it did not puncture dura and was located close the 6 O'Clock position of the pedical in AP tracterory. Biplanar projections were used to confirm position. Aspiration was confirmed to be negative for CSF and/or blood. A 1-2 ml. volume of Isovue-250 was injected and flow of contrast was noted at each level. Radiographs were obtained for documentation purposes.   After  attaining the desired flow of contrast documented above, a 0.5 to 1.0 ml test dose of 0.25% Marcaine was injected into each respective transforaminal space.  The patient was observed for 90 seconds post injection.  After no sensory deficits were reported, and normal lower extremity motor function was noted,   the above injectate was administered so that equal amounts of the injectate were placed at each foramen (level) into the transforaminal epidural space.   Additional Comments:  No complications occurred Dressing: 2 x 2 sterile gauze and Band-Aid    Post-procedure details: Patient was observed during the procedure. Post-procedure instructions were reviewed.  Patient left the clinic in stable condition.

## 2022-08-06 NOTE — ED Provider Notes (Signed)
[facility] EMERGENCY DEPT Provider Note   CSN: [id number] Arrival date & time: 08/06/22  2007     History  Chief Complaint  Patient presents with   Shortness of Breath    Leslie [name] is a 72 y.o. female.  Pt is a 72 yo female presenting for sob. Pt states last week she had a steroid injection in her lumbar spine. States she started having sob shortly after. Sob is worse with exertion stating it is difficult for her to even walk several steps without becoming winded. Denies chest pain. Denies fevers, chills, coughing. Admits to bilateral lower extremity swelling and orthopnea. Sleeps on 3 pillows at night. Denies hx of DVT or PE.   The history is provided by the patient. No language interpreter was used.  Shortness of Breath Associated symptoms: no abdominal pain, no chest pain, no cough, no ear pain, no fever, no rash, no sore throat and no vomiting        Home Medications Prior to Admission medications   Medication Sig Start Date End Date Taking? Authorizing Provider  ALPRAZolam (XANAX) 0.25 MG tablet TAKE 1 TABLET(0.25 MG) BY MOUTH AT BEDTIME AS NEEDED FOR ANXIETY 06/04/22  Yes Isaac Bliss, Rayford Halsted, MD  amphetamine-dextroamphetamine (ADDERALL XR) 30 MG 24 hr capsule Take 1 capsule (30 mg total) by mouth every morning. 07/04/22  Yes Erline Hau, MD  amphetamine-dextroamphetamine (ADDERALL) 20 MG tablet Take 1 tablet (20 mg total) by mouth daily. 07/04/22  Yes Isaac Bliss, Rayford Halsted, MD  FLUoxetine (PROZAC) 40 MG capsule TAKE 1 CAPSULE(40 MG) BY MOUTH EVERY MORNING 07/03/22  Yes Isaac Bliss, Rayford Halsted, MD  fluticasone [facility]) 50 MCG/ACT nasal spray Place 1 spray into both nostrils daily.   Yes [provider]  furosemide (LASIX) 20 MG tablet Take 1 tablet (20 mg total) by mouth daily for 2 days. 08/08/22 08/10/22 Yes Long, Wonda Olds, MD  gabapentin (NEURONTIN) 100 MG capsule TAKE 1 CAPSULE(100 MG) BY MOUTH THREE TIMES DAILY  03/11/22  Yes Pete Pelt, PA-C  hydrochlorothiazide (HYDRODIURIL) 25 MG tablet TAKE 1 TABLET(25 MG) BY MOUTH DAILY 06/17/22  Yes Isaac Bliss, Rayford Halsted, MD  lamoTRIgine (LAMICTAL) 100 MG tablet TAKE 1 TABLET(100 MG) BY MOUTH TWICE DAILY 04/22/22  Yes Isaac Bliss, Rayford Halsted, MD  Multiple Vitamins-Minerals (PRESERVISION AREDS PO) Take 2 tablets by mouth 2 (two) times daily.   Yes [provider]  pseudoephedrine (SUDAFED) 30 MG tablet Take 30 mg by mouth every 4 (four) hours as needed for congestion.   Yes [provider]  zolpidem (AMBIEN) 10 MG tablet TAKE 1 TABLET(10 MG) BY MOUTH AT BEDTIME 08/01/22  Yes Isaac Bliss, Rayford Halsted, MD  amphetamine-dextroamphetamine (ADDERALL XR) 30 MG 24 hr capsule Take 1 capsule (30 mg total) by mouth every morning. 06/06/22   Erline Hau, MD  amphetamine-dextroamphetamine (ADDERALL XR) 30 MG 24 hr capsule Take 1 capsule (30 mg total) by mouth every morning. 07/04/22   Erline Hau, MD  amphetamine-dextroamphetamine (ADDERALL XR) 30 MG 24 hr capsule Take 1 capsule (30 mg total) by mouth every morning. 07/04/22   Erline Hau, MD  amphetamine-dextroamphetamine (ADDERALL) 20 MG tablet Take 1 tablet (20 mg total) by mouth daily. 06/06/22   Erline Hau, MD  amphetamine-dextroamphetamine (ADDERALL) 20 MG tablet Take 1 tablet (20 mg total) by mouth daily. 07/04/22   Erline Hau, MD  amphetamine-dextroamphetamine (ADDERALL) 20 MG tablet Take 1 tablet (20  mg total) by mouth daily. 07/04/22   Isaac Bliss, Rayford Halsted, MD      Allergies    Amoxicillin and Sulfamethoxazole    Review of Systems   Review of Systems  Constitutional:  Negative for chills and fever.  HENT:  Negative for ear pain and sore throat.   Eyes:  Negative for pain and visual disturbance.  Respiratory:  Positive for shortness of breath. Negative for cough.   Cardiovascular:  Negative for chest pain  and palpitations.  Gastrointestinal:  Negative for abdominal pain and vomiting.  Genitourinary:  Negative for dysuria and hematuria.  Musculoskeletal:  Negative for arthralgias and back pain.  Skin:  Negative for color change and rash.  Neurological:  Negative for seizures and syncope.  All other systems reviewed and are negative.   Physical Exam Updated Vital Signs BP 122/74 (BP Location: Left Arm)   Pulse 88   Temp 97.9 F (36.6 C) (Oral)   Resp 18   Ht 5\' 4"  (1.626 m)   Wt 88.5 kg   SpO2 100%   BMI 33.47 kg/m  Physical Exam Vitals and nursing note reviewed.  Constitutional:      General: She is not in acute distress.    Appearance: She is well-developed.  HENT:     Head: Normocephalic and atraumatic.  Eyes:     Conjunctiva/sclera: Conjunctivae normal.  Cardiovascular:     Rate and Rhythm: Normal rate and regular rhythm.     Heart sounds: No murmur heard. Pulmonary:     Effort: Pulmonary effort is normal. No respiratory distress.     Breath sounds: Normal breath sounds.  Abdominal:     Palpations: Abdomen is soft.     Tenderness: There is no abdominal tenderness.  Musculoskeletal:        General: No swelling.     Cervical back: Neck supple.     Right lower leg: 2+ Pitting Edema present.     Left lower leg: 2+ Pitting Edema present.  Skin:    General: Skin is warm and dry.     Capillary Refill: Capillary refill takes less than 2 seconds.  Neurological:     Mental Status: She is alert.  Psychiatric:        Mood and Affect: Mood normal.     ED Results / Procedures / Treatments   Labs (all labs ordered are listed, but only abnormal results are displayed) Labs Reviewed  CBC WITH DIFFERENTIAL/PLATELET - Abnormal; Notable for the following components:      Result Value   RBC 3.55 (*)    Hemoglobin 11.8 (*)    MCV 104.8 (*)    All other components within normal limits  COMPREHENSIVE METABOLIC PANEL - Abnormal; Notable for the following components:   CO2 21  (*)    Glucose, Bld 110 (*)    Creatinine, Ser 1.04 (*)    GFR, Estimated 57 (*)    All other components within normal limits  D-DIMER, QUANTITATIVE - Abnormal; Notable for the following components:   D-Dimer, Quant 1.00 (*)    All other components within normal limits  BRAIN NATRIURETIC PEPTIDE - Abnormal; Notable for the following components:   B Natriuretic Peptide 1,711.1 (*)    All other components within normal limits  BASIC METABOLIC PANEL - Abnormal; Notable for the following components:   Creatinine, Ser 1.14 (*)    GFR, Estimated 51 (*)    All other components within normal limits  TROPONIN I (HIGH SENSITIVITY) - Abnormal;  Notable for the following components:   Troponin I (High Sensitivity) 50 (*)    All other components within normal limits  TROPONIN I (HIGH SENSITIVITY) - Abnormal; Notable for the following components:   Troponin I (High Sensitivity) 49 (*)    All other components within normal limits  RESP PANEL BY RT-PCR (RSV, FLU A&B, COVID)  [id number]    EKG EKG Interpretation  Date/Time:  Wednesday August 07 2022 10:28:41 EST Ventricular Rate:  79 PR Interval:  156 QRS Duration: 101 QT Interval:  394 QTC Calculation: 452 R Axis:   39 Text Interpretation: Sinus rhythm Multiple premature complexes, vent & supraven Probable left atrial enlargement LVH with secondary repolarization abnormality PVCs, otherwise unchanged from prior EKG last night Confirmed by Elayne Snare ([id number]) on 08/07/2022 10:44:03 AM  Radiology No results found.  Procedures Procedures    Medications Ordered in ED Medications  furosemide (LASIX) injection 40 mg (40 mg Intravenous Given 08/06/22 2354)  ALPRAZolam (XANAX) tablet 0.25 mg (0.25 mg Oral Given 08/06/22 2353)  iohexol (OMNIPAQUE) 350 MG/ML injection 75 mL (75 mLs Intravenous Contrast Given 08/07/22 0008)  ALPRAZolam (XANAX) tablet 0.25 mg (0.25 mg Oral Given 08/07/22 0403)  ALPRAZolam Prudy Feeler) tablet 0.25 mg (0.25 mg Oral Given  08/07/22 0852)  furosemide (LASIX) injection 40 mg (40 mg Intravenous Given 08/07/22 1608)    ED Course/ Medical Decision Making/ A&P                             Medical Decision Making Amount and/or Complexity of Data Reviewed Labs: ordered. Radiology: ordered.  Risk Prescription drug management.   7:45 AM 72 yo female presenting for sob.  Patient is alert oriented x 3, no acute distress, afebrile, some vital signs.  Physical exam demonstrates equal bilateral breath sounds with no adventitious lung sounds.  Pitting edema bilateral lower extremities present.  Chart review: Unable to find evidence of recent echocardiogram.  Laboratory studies and imaging: Laboratory studies as interpreted by myself.  Imaging interpreted by radiology.  Patient is stable EKG without ST segment elevation or depression. Troponin 50 and repeat 49.  Delta troponin negative value.  Doubt myocardial infarction.  Stable electrolytes and chest x-ray.  BNP elevated at 1711.  D-dimer minimally elevated.  CT PE pending.  Patient signed out to oncoming physician while awaiting CT PE results with recommendations for likely admission for CHF exacerbation.        Final Clinical Impression(s) / ED Diagnoses Final diagnoses:  Acute congestive heart failure, unspecified heart failure type (HCC)    Rx / DC Orders ED Discharge Orders          Ordered    furosemide (LASIX) 20 MG tablet  Daily        08/07/22 1654    Ambulatory referral to Cardiology       Comments: If you have not heard from the Cardiology office within the next 72 hours please call [phone].   08/07/22 1655              Franne Forts, DO 08/11/22 [phone]

## 2022-08-06 NOTE — Progress Notes (Signed)
Functional Pain Scale - descriptive words and definitions  Intense (8)    Cannot complete any ADLs without much assistance/cannot concentrate/conversation is difficult/unable to sleep and unable to use distraction. Severe range order  Average Pain 8  Last injection did not help at all. Still having pain. Having shortness of breath after walking short distances.

## 2022-08-06 NOTE — Progress Notes (Signed)
Leslie [name] - 72 y.o. female MRN [id number]  Date of birth: [birth year]  Office Visit Note: Visit Date: 07/30/2022 PCP: Isaac Bliss, Rayford Halsted, MD Referred by: Isaac Bliss, Estel*  Subjective: Chief Complaint  Patient presents with   Lower Back - Pain   HPI:  Leslie [name] is a 72 y.o. female who comes in today for planned repeat Bilateral L4-5  Lumbar Transforaminal epidural steroid injection with fluoroscopic guidance.  The patient has failed conservative care including home exercise, medications, time and activity modification.  This injection will be diagnostic and hopefully therapeutic.  Please see requesting physician notes for further details and justification. Patient received more than 50% pain relief from prior injection.   Referring: Barnet Pall, FNP   ROS Otherwise per HPI.  Assessment & Plan: Visit Diagnoses:    ICD-10-CM   1. Lumbar radiculopathy  M54.16 XR C-ARM NO REPORT    Epidural Steroid injection    methylPREDNISolone acetate (DEPO-MEDROL) injection 80 mg    2. Spinal stenosis of lumbar region with neurogenic claudication  M48.062       Plan: No additional findings.   Meds & Orders:  Meds ordered this encounter  Medications   methylPREDNISolone acetate (DEPO-MEDROL) injection 80 mg    Orders Placed This Encounter  Procedures   XR C-ARM NO REPORT   Epidural Steroid injection    Follow-up: Return for visit to requesting provider as needed.   Procedures: No procedures performed  Lumbosacral Transforaminal Epidural Steroid Injection - Sub-Pedicular Approach with Fluoroscopic Guidance  Patient: Leslie [name]      Date of Birth: 12-08-1950 MRN: [id number] PCP: Isaac Bliss, Rayford Halsted, MD      Visit Date: 07/30/2022   Universal Protocol:    Date/Time: 07/30/2022  Consent Given By: the patient  Position: PRONE  Additional Comments: Vital signs were monitored before and after the procedure. Patient was prepped and  draped in the usual sterile fashion. The correct patient, procedure, and site was verified.   Injection Procedure Details:   Procedure diagnoses: Lumbar radiculopathy [M54.16]    Meds Administered:  Meds ordered this encounter  Medications   methylPREDNISolone acetate (DEPO-MEDROL) injection 80 mg    Laterality: Bilateral  Location/Site: L4  Needle:5.0 in., 22 ga.  Short bevel or Quincke spinal needle  Needle Placement: Transforaminal  Findings:    -Comments: Excellent flow of contrast along the nerve, nerve root and into the epidural space.  Procedure Details: After squaring off the end-plates to get a true AP view, the C-arm was positioned so that an oblique view of the foramen as noted above was visualized. The target area is just inferior to the "nose of the scotty dog" or sub pedicular. The soft tissues overlying this structure were infiltrated with 2-3 ml. of 1% Lidocaine without Epinephrine.  The spinal needle was inserted toward the target using a "trajectory" view along the fluoroscope beam.  Under AP and lateral visualization, the needle was advanced so it did not puncture dura and was located close the 6 O'Clock position of the pedical in AP tracterory. Biplanar projections were used to confirm position. Aspiration was confirmed to be negative for CSF and/or blood. A 1-2 ml. volume of Isovue-250 was injected and flow of contrast was noted at each level. Radiographs were obtained for documentation purposes.   After attaining the desired flow of contrast documented above, a 0.5 to 1.0 ml test dose of 0.25% Marcaine was injected into each respective transforaminal space.  The patient  was observed for 90 seconds post injection.  After no sensory deficits were reported, and normal lower extremity motor function was noted,   the above injectate was administered so that equal amounts of the injectate were placed at each foramen (level) into the transforaminal epidural  space.   Additional Comments:  No complications occurred Dressing: 2 x 2 sterile gauze and Band-Aid    Post-procedure details: Patient was observed during the procedure. Post-procedure instructions were reviewed.  Patient left the clinic in stable condition.    Clinical History: No specialty comments available.     Objective:  VS:  HT:    WT:   BMI:     BP:(!) 159/100  HR:86bpm  TEMP: ( )  RESP:  Physical Exam Vitals and nursing note reviewed.  Constitutional:      General: She is not in acute distress.    Appearance: Normal appearance. She is not ill-appearing.  HENT:     Head: Normocephalic and atraumatic.     Right Ear: External ear normal.     Left Ear: External ear normal.  Eyes:     Extraocular Movements: Extraocular movements intact.  Cardiovascular:     Rate and Rhythm: Normal rate.     Pulses: Normal pulses.  Pulmonary:     Effort: Pulmonary effort is normal. No respiratory distress.  Abdominal:     General: There is no distension.     Palpations: Abdomen is soft.  Musculoskeletal:        General: Tenderness present.     Cervical back: Neck supple.     Right lower leg: No edema.     Left lower leg: No edema.     Comments: Patient has good distal strength with no pain over the greater trochanters.  No clonus or focal weakness.  Skin:    Findings: No erythema, lesion or rash.  Neurological:     General: No focal deficit present.     Mental Status: She is alert and oriented to person, place, and time.     Sensory: No sensory deficit.     Motor: No weakness or abnormal muscle tone.     Coordination: Coordination normal.  Psychiatric:        Mood and Affect: Mood normal.        Behavior: Behavior normal.      Imaging: No results found.

## 2022-08-06 NOTE — Progress Notes (Deleted)
Leslie [name] - 72 y.o. female MRN [id number]  Date of birth: Jul 31, 1950  Virtual Visit via Video Note  I connected with Leslie [name] on 08/06/2022 at  9:45 AM EST by a video enabled telemedicine application and verified that I am speaking with the correct person using two identifiers.   I discussed the limitations of evaluation and management by telemedicine and the availability of in person appointments. The patient expressed understanding and agreed to proceed.  Visit Date: 08/06/2022 PCP: Isaac Bliss, Rayford Halsted, MD Referred by: Isaac Bliss, Estel*  Subjective: Chief Complaint  Patient presents with   Lower Back - Pain   HPI: Leslie [name] is a 72 y.o. female. HPI ROS Otherwise per HPI.  Objective/Observation:  ***  Assessment & Plan: Visit Diagnoses:  1. Spinal stenosis of lumbar region with neurogenic claudication   2. Spondylosis without myelopathy or radiculopathy, lumbar region   3. Lumbar radiculopathy     Plan: No additional findings.   Meds & Orders: No orders of the defined types were placed in this encounter.   Orders Placed This Encounter  Procedures   Ambulatory referral to Physical Medicine Rehab    Follow-up: No follow-ups on file.  Follow Up Instructions:    I discussed the assessment and treatment plan with the patient. The patient was provided an opportunity to ask questions and all were answered. The patient agreed with the plan and demonstrated an understanding of the instructions.   The patient was advised to call back or seek an in-person evaluation if the symptoms worsen or if the condition fails to improve as anticipated.  I provided *** minutes of non-face-to-face time during this encounter.   Laurence Spates, MD    Clinical History: No specialty comments available.   She reports that she quit smoking about 30 years ago. Her smoking use included cigarettes. She has never used smokeless tobacco. No results for  input(s): "HGBA1C", "LABURIC" in the last 8760 hours.  Past Medical/Family/Surgical/Social History: Medications & Allergies reviewed per EMR, new medications updated. Patient Active Problem List   Diagnosis Date Noted   Status post right knee replacement 05/04/2021   Vitamin B12 deficiency 03/14/2021   Macrocytosis without anemia 03/14/2021   Vitamin D deficiency 02/17/2020   Sleep disorder 07/10/2018   ADHD (attention deficit hyperactivity disorder) 10/01/2017   Unilateral primary osteoarthritis, right knee 08/20/2017   Chronic pain of both shoulders 08/20/2017   Complete tear of right rotator cuff 04/29/2017   Osteoarthritis of left knee 06/16/2015   Status post total left knee replacement 06/16/2015   Attention deficit disorder 09/14/2007   COLONIC POLYPS, HX OF 08/03/2007   Essential hypertension 01/20/2007   OBESITY NOS 12/04/2006   Allergic rhinitis 12/04/2006   GASTROJEJUNOSTOMY, HX OF 12/04/2006   BREAST BIOPSY, HX OF 12/04/2006   Past Medical History:  Diagnosis Date   ADD (attention deficit disorder)    Allergy    Anemia    Arthritis 07/22/2014   osteoarthritis left knee   Colon polyps    Hypertension    Left knee pain    chronic   Narcotic addiction ([place])    Obesity    post gastric bypass   Vitamin D deficiency    Family History  Problem Relation Age of Onset   Heart disease Mother        post CABG history of CHF   COPD Father    Diabetes Sister    Hypertension Sister  post renal transplant   Heart disease Brother        CAD   Past Surgical History:  Procedure Laterality Date   arthscopic knee surgery Left    several times   BREAST BIOPSY     BREAST SURGERY  years ago   breast biopsy   CHOLECYSTECTOMY     EYE SURGERY Bilateral 2013   Toric Lens implants   EYE SURGERY Right 2014   corneal amniotic membrane   GASTRIC BYPASS  2003   KIDNEY DONATION  2004   TOTAL KNEE ARTHROPLASTY Left 06/16/2015   Procedure: LEFT TOTAL KNEE  ARTHROPLASTY;  Surgeon: Mcarthur Rossetti, MD;  Location: [facility];  Service: Orthopedics;  Laterality: Left;   TOTAL KNEE ARTHROPLASTY Right 05/04/2021   Procedure: RIGHT TOTAL KNEE ARTHROPLASTY;  Surgeon: Mcarthur Rossetti, MD;  Location: [facility];  Service: Orthopedics;  Laterality: Right;   Social History   Occupational History   Not on file  Tobacco Use   Smoking status: Former    Types: Cigarettes    Quit date: 07/22/1992    Years since quitting: 30.0   Smokeless tobacco: Never  Vaping Use   Vaping Use: Never used  Substance and Sexual Activity   Alcohol use: Yes    Comment: wine weekly   Drug use: No   Sexual activity: Not on file

## 2022-08-06 NOTE — ED Triage Notes (Signed)
Patient arrives to ED POV c/o SHOB x10 days ago. Per Pt she had a spinals stenosis 10 days ago and has been [facility] ever since. No other complaints at this time. Pt A/O x4.

## 2022-08-07 ENCOUNTER — Emergency Department (HOSPITAL_BASED_OUTPATIENT_CLINIC_OR_DEPARTMENT_OTHER): Payer: Medicare PPO

## 2022-08-07 ENCOUNTER — Other Ambulatory Visit: Payer: Self-pay

## 2022-08-07 ENCOUNTER — Other Ambulatory Visit (HOSPITAL_BASED_OUTPATIENT_CLINIC_OR_DEPARTMENT_OTHER): Payer: Self-pay

## 2022-08-07 ENCOUNTER — Encounter (HOSPITAL_COMMUNITY): Payer: Self-pay

## 2022-08-07 ENCOUNTER — Ambulatory Visit: Payer: Medicare PPO | Admitting: Internal Medicine

## 2022-08-07 DIAGNOSIS — J9811 Atelectasis: Secondary | ICD-10-CM | POA: Diagnosis not present

## 2022-08-07 DIAGNOSIS — I509 Heart failure, unspecified: Secondary | ICD-10-CM

## 2022-08-07 DIAGNOSIS — J90 Pleural effusion, not elsewhere classified: Secondary | ICD-10-CM | POA: Diagnosis not present

## 2022-08-07 LAB — BASIC METABOLIC PANEL
Anion gap: 10 (ref 5–15)
BUN: 22 mg/dL (ref 8–23)
CO2: 26 mmol/L (ref 22–32)
Calcium: 9.3 mg/dL (ref 8.9–10.3)
Chloride: 101 mmol/L (ref 98–111)
Creatinine, Ser: 1.14 mg/dL — ABNORMAL HIGH (ref 0.44–1.00)
GFR, Estimated: 51 mL/min — ABNORMAL LOW (ref >60)
Glucose, Bld: 94 mg/dL (ref 70–99)
Potassium: 3.7 mmol/L (ref 3.5–5.1)
Sodium: 137 mmol/L (ref 135–145)

## 2022-08-07 LAB — BRAIN NATRIURETIC PEPTIDE: B Natriuretic Peptide: 1711.1 pg/mL — ABNORMAL HIGH (ref 0.0–100.0)

## 2022-08-07 MED ORDER — ALPRAZOLAM 0.5 MG PO TABS
0.2500 mg | ORAL_TABLET | Freq: Once | ORAL | Status: AC
  Administered 2022-08-07: 0.25 mg via ORAL
  Filled 2022-08-07: qty 1

## 2022-08-07 MED ORDER — FUROSEMIDE 10 MG/ML IJ SOLN
40.0000 mg | Freq: Once | INTRAMUSCULAR | Status: AC
  Administered 2022-08-07: 40 mg via INTRAVENOUS
  Filled 2022-08-07: qty 4

## 2022-08-07 MED ORDER — FUROSEMIDE 20 MG PO TABS: 20.0000 mg | ORAL_TABLET | Freq: Every day | ORAL | 0 refills | Status: DC

## 2022-08-07 MED ORDER — IOHEXOL 350 MG/ML SOLN
75.0000 mL | Freq: Once | INTRAVENOUS | Status: AC | PRN
  Administered 2022-08-07: 75 mL via INTRAVENOUS

## 2022-08-07 NOTE — ED Provider Notes (Signed)
  Care assumed from Dr. Pearline Cables.  Patient here with shortness of breath for best 7 to 10 days worse with exertion.  No chest pain, cough or fever.  Appears to have new onset CHF.  Awaiting BNP CT angiogram of chest to rule out PE.  CT negative for pulmonary embolism.  Does show evidence of pleural effusions and likely pulmonary edema.  Patient short of breath with ambulation desaturates to 91%.  BNP 1700.  No echocardiogram in the system.  Given symptomatic dyspnea on exertion and new onset CHF we will plan admission.  Discussed with Dr. Myna Hidalgo.   Leslie Essex, MD 08/07/22 [phone]

## 2022-08-07 NOTE — ED Notes (Signed)
Patient ambulated to restroom and back with pulse ox. Lowest SpO2 91%. Patient states on mild ShOB. "Better than when I got here". EDP notified.

## 2022-08-07 NOTE — Progress Notes (Signed)
Plan of Care Note for accepted transfer   Patient: Leslie [name] MRN: [id number]   [place]: 08/06/2022  Facility requesting transfer: [facility]   Requesting Provider: Dr. Wyvonnia Dusky   Reason for transfer: New CHF   Facility course: 72 yr old lady with hx of HTN, ADD, chronic pain, and anxiety who p/w 10 days of SOB. She is found to have BNP 1711, troponin of 50 and then 49, and CTA with cardiomegaly, small b/l pleural effusions, and suspected pulmonary edema. She was treated with 40 mg IV Lasix, Xanax, and albuterol.   Plan of care: The patient is accepted for admission to Telemetry unit, at [facility].   Author: Vianne Bulls, MD 08/07/2022  Check [email or web address] for on-call coverage.  Nursing staff, Please call [place] number on Amion as soon as patient's arrival, so appropriate admitting provider can evaluate the pt.

## 2022-08-07 NOTE — ED Provider Notes (Signed)
Blood pressure 122/74, pulse 88, temperature 97.9 F (36.6 C), temperature source Oral, resp. rate 18, height 5\' 4"  (1.626 m), weight 88.5 kg, SpO2 100 %.   In short, Leslie [name] is a 72 y.o. female with a chief complaint of Shortness of Breath .  Refer to the original H&P for additional details.  04:19 PM  Patient boarding in the emergency department now for 20 hours awaiting a bed at [name].  In discussion with the placement the timing of bed placement is still unknown.  The emergency department is significantly boarded as well making transfer to the ED unlikely.  In discussing things with the patient she is feeling much better after Lasix overnight.  She has been urinating frequently.  She is up and ambulatory here in the emergency department without significant difficulty, which she was having at home.  She is not hypoxemic.  Family, at bedside, noted that she looks "much better" and patient verbalizes request to be discharged.  Plan to repeat a chemistry and would consider d/c with Cardiology referral for outpatient w/u of CHF.   04:44 PM  Repeat chemistry shows no acute electrolyte disturbance.  Creatinine 1.14.  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  She had additional Lasix here in the ED this afternoon.  Plan to refer her for outpatient cardiology follow-up with low-dose Lasix over the next 2 days at home.  Discussed strict ED return precaution. Patient in agreement with plan.    [name], MD 08/07/22 [phone]

## 2022-08-07 NOTE — Discharge Instructions (Signed)
You were seen in the emergency room today with shortness of breath.  From your workup yesterday it appears she had some fluid in the lungs.  We discussed admitting you to the hospital for further workup but after treatment in the emergency department you are feeling much better.  We discussed discharge home in the setting with 2 more days of low-dose Lasix.  You had today's dose of Lasix and so take your next dose tomorrow.  I would like your primary care doctor to repeat blood work either late this week or early next week.  I have also placed a referral in our system to see the cardiologist.  They should be calling you for an appointment but if you do not hear from them by midday tomorrow please call for follow-up.  If you develop any new or suddenly worsening symptoms he should call 911 and/or return to the emergency department immediately.

## 2022-08-08 ENCOUNTER — Ambulatory Visit: Payer: Medicare PPO | Admitting: Internal Medicine

## 2022-08-08 ENCOUNTER — Telehealth: Payer: Self-pay

## 2022-08-08 VITALS — BP 130/80 | HR 96 | Temp 98.6°F | Wt 189.0 lb

## 2022-08-08 DIAGNOSIS — I509 Heart failure, unspecified: Secondary | ICD-10-CM

## 2022-08-08 DIAGNOSIS — Z09 Encounter for follow-up examination after completed treatment for conditions other than malignant neoplasm: Secondary | ICD-10-CM

## 2022-08-08 NOTE — Telephone Encounter (Signed)
Scheduled for ESI but patient wants you to know she recently diagnosed with CHF and wants to know if this will impact her ESI? She is to see cardiology tomorrow

## 2022-08-08 NOTE — Progress Notes (Signed)
Established Patient Office Visit     CC/Reason for Visit: Hospital discharge follow-up  HPI: Leslie [name] is a 72 y.o. female who is coming in today for the above mentioned reasons.She presented to the ED on 1/16 with SOB and was found to have pulmonary edema. A diagnosis of CHF was made and it was decided to admit her. Ct angio was negative for PE. Was given IV lasix. After boarding in the ED for 20 hours decision was made to discharge her since she had made some improvement and still no beds were available. An appointment was made for her to see cardiology on 1/19. She feels weak but not very SOB.   Past Medical/Surgical History: Past Medical History:  Diagnosis Date   ADD (attention deficit disorder)    Allergy    Anemia    Arthritis 07/22/2014   osteoarthritis left knee   Colon polyps    Hypertension    Left knee pain    chronic   Narcotic addiction ([place])    Obesity    post gastric bypass   Vitamin D deficiency     Past Surgical History:  Procedure Laterality Date   arthscopic knee surgery Left    several times   BREAST BIOPSY     BREAST SURGERY  years ago   breast biopsy   CHOLECYSTECTOMY     EYE SURGERY Bilateral 2013   Toric Lens implants   EYE SURGERY Right 2014   corneal amniotic membrane   GASTRIC BYPASS  2003   KIDNEY DONATION  2004   TOTAL KNEE ARTHROPLASTY Left 06/16/2015   Procedure: LEFT TOTAL KNEE ARTHROPLASTY;  Surgeon: Mcarthur Rossetti, MD;  Location: [facility];  Service: Orthopedics;  Laterality: Left;   TOTAL KNEE ARTHROPLASTY Right 05/04/2021   Procedure: RIGHT TOTAL KNEE ARTHROPLASTY;  Surgeon: Mcarthur Rossetti, MD;  Location: [facility];  Service: Orthopedics;  Laterality: Right;    Social History:  reports that she quit smoking about 30 years ago. Her smoking use included cigarettes. She has never used smokeless tobacco. She reports current alcohol use. She reports that she does not use drugs.  Allergies: Allergies   Allergen Reactions   Amoxicillin     Hives   Sulfamethoxazole Nausea And Vomiting    See patient list of med intolerances due to h/o gastric bypass and nephrectomy    Family History:  Family History  Problem Relation Age of Onset   Heart disease Mother        post CABG history of CHF   COPD Father    Diabetes Sister    Hypertension Sister        post renal transplant   Heart disease Brother        CAD     Current Outpatient Medications:    ALPRAZolam (XANAX) 0.25 MG tablet, TAKE 1 TABLET(0.25 MG) BY MOUTH AT BEDTIME AS NEEDED FOR ANXIETY, Disp: 30 tablet, Rfl: 2   amphetamine-dextroamphetamine (ADDERALL XR) 30 MG 24 hr capsule, Take 1 capsule (30 mg total) by mouth every morning., Disp: 30 capsule, Rfl: 0   amphetamine-dextroamphetamine (ADDERALL XR) 30 MG 24 hr capsule, Take 1 capsule (30 mg total) by mouth every morning., Disp: 30 capsule, Rfl: 0   amphetamine-dextroamphetamine (ADDERALL XR) 30 MG 24 hr capsule, Take 1 capsule (30 mg total) by mouth every morning., Disp: 30 capsule, Rfl: 0   amphetamine-dextroamphetamine (ADDERALL XR) 30 MG 24 hr capsule, Take 1 capsule (30 mg total) by mouth every  morning., Disp: 30 capsule, Rfl: 0   amphetamine-dextroamphetamine (ADDERALL) 20 MG tablet, Take 1 tablet (20 mg total) by mouth daily., Disp: 30 tablet, Rfl: 0   amphetamine-dextroamphetamine (ADDERALL) 20 MG tablet, Take 1 tablet (20 mg total) by mouth daily., Disp: 30 tablet, Rfl: 0   amphetamine-dextroamphetamine (ADDERALL) 20 MG tablet, Take 1 tablet (20 mg total) by mouth daily., Disp: 30 tablet, Rfl: 0   amphetamine-dextroamphetamine (ADDERALL) 20 MG tablet, Take 1 tablet (20 mg total) by mouth daily., Disp: 30 tablet, Rfl: 0   FLUoxetine (PROZAC) 40 MG capsule, TAKE 1 CAPSULE(40 MG) BY MOUTH EVERY MORNING, Disp: 90 capsule, Rfl: 1   fluticasone (FLONASE) 50 MCG/ACT nasal spray, Place 1 spray into both nostrils daily., Disp: , Rfl:    furosemide (LASIX) 20 MG tablet, Take 1  tablet (20 mg total) by mouth daily for 2 days., Disp: 2 tablet, Rfl: 0   gabapentin (NEURONTIN) 100 MG capsule, TAKE 1 CAPSULE(100 MG) BY MOUTH THREE TIMES DAILY, Disp: 60 capsule, Rfl: 1   hydrochlorothiazide (HYDRODIURIL) 25 MG tablet, TAKE 1 TABLET(25 MG) BY MOUTH DAILY, Disp: 90 tablet, Rfl: 1   lamoTRIgine (LAMICTAL) 100 MG tablet, TAKE 1 TABLET(100 MG) BY MOUTH TWICE DAILY, Disp: 180 tablet, Rfl: 1   pseudoephedrine (SUDAFED) 30 MG tablet, Take 30 mg by mouth every 4 (four) hours as needed for congestion., Disp: , Rfl:    zolpidem (AMBIEN) 10 MG tablet, TAKE 1 TABLET(10 MG) BY MOUTH AT BEDTIME, Disp: 30 tablet, Rfl: 2   buPROPion (WELLBUTRIN XL) 150 MG 24 hr tablet, TAKE 1 TABLET(150 MG) BY MOUTH DAILY (Patient not taking: Reported on 08/07/2022), Disp: 90 tablet, Rfl: 1   Multiple Vitamins-Minerals (PRESERVISION AREDS PO), Take 2 tablets by mouth 2 (two) times daily. (Patient not taking: Reported on 08/08/2022), Disp: , Rfl:   Review of Systems:  Negative unless indicated in HPI.   Physical Exam: Vitals:   08/08/22 1516  BP: 130/80  Pulse: 96  Temp: 98.6 F (37 C)  TempSrc: Oral  SpO2: 95%  Weight: 189 lb (85.7 kg)    Body mass index is 32.44 kg/m.   Physical Exam Vitals reviewed.  Constitutional:      Appearance: Normal appearance.  HENT:     Head: Normocephalic and atraumatic.  Eyes:     Conjunctiva/sclera: Conjunctivae normal.     Pupils: Pupils are equal, round, and reactive to light.  Cardiovascular:     Rate and Rhythm: Normal rate and regular rhythm.  Pulmonary:     Effort: Pulmonary effort is normal.     Breath sounds: Examination of the right-upper field reveals rales. Examination of the left-upper field reveals rales. Examination of the right-middle field reveals rales. Examination of the left-middle field reveals rales. Examination of the right-lower field reveals rales. Examination of the left-lower field reveals rales. Rales present.  Skin:    General:  Skin is warm and dry.  Neurological:     General: No focal deficit present.     Mental Status: She is alert and oriented to person, place, and time.  Psychiatric:        Mood and Affect: Mood normal.        Behavior: Behavior normal.        Thought Content: Thought content normal.        Judgment: Judgment normal.      Impression and Plan:  Hospital discharge follow-up  Acute congestive heart failure, unspecified heart failure type [facility])  [facility] charts reviewed in great  detail. -She no longer feels short of breath although remains tired.  She has been taking Lasix 20 mg daily as advised by ED. -She has an initial appointment with cardiology scheduled for tomorrow.  Time spent:34 minutes reviewing chart, interviewing and examining patient and formulating plan of care.     Chaya Jan, MD [facility]

## 2022-08-09 ENCOUNTER — Ambulatory Visit (INDEPENDENT_AMBULATORY_CARE_PROVIDER_SITE_OTHER): Payer: Medicare PPO

## 2022-08-09 ENCOUNTER — Encounter: Payer: Self-pay | Admitting: Internal Medicine

## 2022-08-09 ENCOUNTER — Ambulatory Visit: Payer: Medicare PPO | Attending: Internal Medicine | Admitting: Internal Medicine

## 2022-08-09 VITALS — BP 127/81 | HR 98 | Ht 64.0 in | Wt 191.6 lb

## 2022-08-09 DIAGNOSIS — I4891 Unspecified atrial fibrillation: Secondary | ICD-10-CM

## 2022-08-09 DIAGNOSIS — I10 Essential (primary) hypertension: Secondary | ICD-10-CM

## 2022-08-09 NOTE — Progress Notes (Unsigned)
Enrolled patient for a 7 day Zio XT monitor to be mailed to patients home.  

## 2022-08-09 NOTE — Progress Notes (Signed)
Cardiology Office Note   Date:  08/09/2022   ID:  MARRISA [name], DOB 07/29/1950, MRN [id number]  PCP:  Isaac Bliss, Rayford Halsted, MD  Cardiologist:   Dorris Carnes, MD   Patient presents for SOB and CHF    History of Present Illness: Leslie [name] is a 72 y.o. female with no prior cardiac history   SHe was seen in the ER on 1/16 with SOB   CXR showed pulmomary edema   D Dimer was 1 and a CT angiogram was negative for PE>   SHe was treated with IV lasix    After 20 hours in ED since she improved an there were no options for bed she was discharged.    SHe was seen in Primary Care the following day    She felt weak but was not very SOB     The pt says her SOB started about 2 wks ago   She denies CP   Denies PND  Denies palpitations    3 months ago her breathing was fine  Since ER she had a few doses of 20 mg PO lasix   She has finished      Current Meds  Medication Sig   ALPRAZolam (XANAX) 0.25 MG tablet TAKE 1 TABLET(0.25 MG) BY MOUTH AT BEDTIME AS NEEDED FOR ANXIETY   amphetamine-dextroamphetamine (ADDERALL XR) 30 MG 24 hr capsule Take 1 capsule (30 mg total) by mouth every morning.   amphetamine-dextroamphetamine (ADDERALL XR) 30 MG 24 hr capsule Take 1 capsule (30 mg total) by mouth every morning.   amphetamine-dextroamphetamine (ADDERALL XR) 30 MG 24 hr capsule Take 1 capsule (30 mg total) by mouth every morning.   amphetamine-dextroamphetamine (ADDERALL XR) 30 MG 24 hr capsule Take 1 capsule (30 mg total) by mouth every morning.   amphetamine-dextroamphetamine (ADDERALL) 20 MG tablet Take 1 tablet (20 mg total) by mouth daily.   amphetamine-dextroamphetamine (ADDERALL) 20 MG tablet Take 1 tablet (20 mg total) by mouth daily.   amphetamine-dextroamphetamine (ADDERALL) 20 MG tablet Take 1 tablet (20 mg total) by mouth daily.   amphetamine-dextroamphetamine (ADDERALL) 20 MG tablet Take 1 tablet (20 mg total) by mouth daily.   FLUoxetine (PROZAC) 40 MG capsule TAKE 1  CAPSULE(40 MG) BY MOUTH EVERY MORNING   fluticasone (FLONASE) 50 MCG/ACT nasal spray Place 1 spray into both nostrils daily.   furosemide (LASIX) 20 MG tablet Take 1 tablet (20 mg total) by mouth daily for 2 days.   gabapentin (NEURONTIN) 100 MG capsule TAKE 1 CAPSULE(100 MG) BY MOUTH THREE TIMES DAILY   hydrochlorothiazide (HYDRODIURIL) 25 MG tablet TAKE 1 TABLET(25 MG) BY MOUTH DAILY   lamoTRIgine (LAMICTAL) 100 MG tablet TAKE 1 TABLET(100 MG) BY MOUTH TWICE DAILY   Multiple Vitamins-Minerals (PRESERVISION AREDS PO) Take 2 tablets by mouth 2 (two) times daily.   pseudoephedrine (SUDAFED) 30 MG tablet Take 30 mg by mouth every 4 (four) hours as needed for congestion.   zolpidem (AMBIEN) 10 MG tablet TAKE 1 TABLET(10 MG) BY MOUTH AT BEDTIME     Allergies:   Amoxicillin and Sulfamethoxazole   Past Medical History:  Diagnosis Date   ADD (attention deficit disorder)    Allergy    Anemia    Arthritis 07/22/2014   osteoarthritis left knee   Colon polyps    Hypertension    Left knee pain    chronic   Narcotic addiction (Leslie [name])    Obesity    post gastric bypass   Vitamin D deficiency  Past Surgical History:  Procedure Laterality Date   arthscopic knee surgery Left    several times   BREAST BIOPSY     BREAST SURGERY  years ago   breast biopsy   CHOLECYSTECTOMY     EYE SURGERY Bilateral 2013   Toric Lens implants   EYE SURGERY Right 2014   corneal amniotic membrane   GASTRIC BYPASS  2003   KIDNEY DONATION  2004   TOTAL KNEE ARTHROPLASTY Left 06/16/2015   Procedure: LEFT TOTAL KNEE ARTHROPLASTY;  Surgeon: Mcarthur Rossetti, MD;  Location: [facility];  Service: Orthopedics;  Laterality: Left;   TOTAL KNEE ARTHROPLASTY Right 05/04/2021   Procedure: RIGHT TOTAL KNEE ARTHROPLASTY;  Surgeon: Mcarthur Rossetti, MD;  Location: [facility];  Service: Orthopedics;  Laterality: Right;     Social History:  The patient  reports that she quit smoking about 30 years ago. Her smoking  use included cigarettes. She has never used smokeless tobacco. She reports current alcohol use. She reports that she does not use drugs.   Family History:  The patient's family history includes COPD in her father; Diabetes in her sister; Heart disease in her brother and mother; Hypertension in her sister.    ROS:  Please see the history of present illness. All other systems are reviewed and  Negative to the above problem except as noted.    PHYSICAL EXAM: VS:  BP 127/81   Pulse 98   Ht 5\' 4"  (1.626 m)   Wt 191 lb 9.6 oz (86.9 kg)   SpO2 97%   BMI 32.89 kg/m   GEN: Well nourished, well developed, in no acute distress  HEENT: normal  Neck: no JVD, carotid bruits,\\ Cardiac: Regular rate with freq skips  NO significant murmurs  NO LE  edema  Respiratory:  clear to auscultation bilaterally,  NO wheezes or rales  GI: soft, nontender, nondistended, + BS  No hepatomegaly  MS: no deformity Moving all extremities   Skin: warm and dry, no rash Neuro:  Strength and sensation are intact Psych: euthymic mood, full affect   EKG:  EKG is ordered today.  MAT  102 bpm   LVH with repolarization abnormality    Lipid Panel    Component Value Date/Time   CHOL 217 (H) 03/13/2021 1501   TRIG 46.0 03/13/2021 1501   HDL 112.30 03/13/2021 1501   CHOLHDL 2 03/13/2021 1501   VLDL 9.2 03/13/2021 1501   LDLCALC 95 03/13/2021 1501   LDLCALC 109 (H) 02/16/2020 1019   LDLDIRECT 109.1 07/27/2008 0942      Wt Readings from Last 3 Encounters:  08/09/22 191 lb 9.6 oz (86.9 kg)  08/08/22 189 lb (85.7 kg)  08/06/22 195 lb (88.5 kg)      ASSESSMENT AND PLAN:  1  CHF  Pt with a 2 wk hx of SOB    After diuresis her breathing is some better   ON exam today she is in an MAT with mild tachycardia   Volume status appears mildly increased Would recomm echocardiogram to define anatomy, LVEF/RVEF Will also set up for a 7 day monitor   MAT was not seen in recent ER visit  Ques if tachycardia is contributing to  symptoms    Will get labs today   FOllow up with further lasix dosing   No other medication changes until tests back Limit salt  2  Rhythm  MAT today   Remote tobacco hx    Get 7 day monitor  3  HTN  BP controlled     4  Hx of CAD    Coronary calcifications noted on CT scan     5  LIpids    WIll need to follow   Last oLDL in 2022 was 95    6   Hx 1 kidney   PT donated kidney in 2004  Need to follow renal function closely    7    HX gastric bypass   Follow up based on test results     Current medicines are reviewed at length with the patient today.  The patient does not have concerns regarding medicines.  Signed, Dietrich Pates, MD  08/09/2022 4:38 PM    [facility] [address] [place], [place], [place]  [address] Phone: [phone]; Fax: [phone]

## 2022-08-09 NOTE — Telephone Encounter (Signed)
Patient aware of the below message  

## 2022-08-09 NOTE — Patient Instructions (Addendum)
Medication Instructions:   *If you need a refill on your cardiac medications before your next appointment, please call your pharmacy*   Lab Work: Bmet and pro bnp  If you have labs (blood work) drawn today and your tests are completely normal, you will receive your results only by: [place] (if you have MyChart) OR A paper copy in the mail If you have any lab test that is abnormal or we need to change your treatment, we will call you to review the results.   Testing/Procedures: Your physician has requested that you have an echocardiogram. Echocardiography is a painless test that uses sound waves to create images of your heart. It provides your doctor with information about the size and shape of your heart and how well your heart's chambers and valves are working. This procedure takes approximately one hour. There are no restrictions for this procedure. Please do NOT wear cologne, perfume, aftershave, or lotions (deodorant is allowed). Please arrive 15 minutes prior to your appointment time.  ZIO XT- Long Term Monitor Instructions  Your physician has requested you wear a ZIO patch monitor for 7 days.  This is a single patch monitor. [employer] supplies one patch monitor per enrollment. Additional stickers are not available. Please do not apply patch if you will be having a Nuclear Stress Test,  Echocardiogram, Cardiac CT, MRI, or Chest Xray during the period you would be wearing the  monitor. The patch cannot be worn during these tests. You cannot remove and re-apply the  ZIO XT patch monitor.  Your ZIO patch monitor will be mailed 3 day USPS to your address on file. It may take 3-5 days  to receive your monitor after you have been enrolled.  Once you have received your monitor, please review the enclosed instructions. Your monitor  has already been registered assigning a specific monitor serial # to you.  Billing and Patient Assistance Program Information  We have supplied  [employer] with any of your insurance information on file for billing purposes. [employer] offers a sliding scale Patient Assistance Program for patients that do not have  insurance, or whose insurance does not completely cover the cost of the ZIO monitor.  You must apply for the Patient Assistance Program to qualify for this discounted rate.  To apply, please call [employer] at [phone], select option 4, select option 2, ask to apply for  Patient Assistance Program. Theodore Demark will ask your household income, and how many people  are in your household. They will quote your out-of-pocket cost based on that information.  [employer] will also be able to set up a 98-month, interest-free payment plan if needed.  Applying the monitor   Shave hair from upper left chest.  Hold abrader disc by orange tab. Rub abrader in 40 strokes over the upper left chest as  indicated in your monitor instructions.  Clean area with 4 enclosed alcohol pads. Let dry.  Apply patch as indicated in monitor instructions. Patch will be placed under collarbone on left  side of chest with arrow pointing upward.  Rub patch adhesive wings for 2 minutes. Remove white label marked "1". Remove the white  label marked "2". Rub patch adhesive wings for 2 additional minutes.  While looking in a mirror, press and release button in center of patch. A small green light will  flash 3-4 times. This will be your only indicator that the monitor has been turned on.  Do not shower for the first 24 hours. You may shower after  the first 24 hours.  Press the button if you feel a symptom. You will hear a small click. Record Date, Time and  Symptom in the Patient Logbook.  When you are ready to remove the patch, follow instructions on the last 2 pages of Patient  Logbook. Stick patch monitor onto the last page of Patient Logbook.  Place Patient Logbook in the blue and white box. Use locking tab on box and tape box closed  securely. The blue and white box  has prepaid postage on it. Please place it in the mailbox as  soon as possible. Your physician should have your test results approximately 7 days after the  monitor has been mailed back to [facility].  Call [place] at [phone] if you have questions regarding  your ZIO XT patch monitor. Call them immediately if you see an orange light blinking on your  monitor.  If your monitor falls off in less than 4 days, contact our Monitor department at [phone].  If your monitor becomes loose or falls off after 4 days call [employer] at [phone] for  suggestions on securing your monitor   Follow-Up: At [facility], you and your health needs are our priority.  As part of our continuing mission to provide you with exceptional heart care, we have created designated Provider Care Teams.  These Care Teams include your primary Cardiologist (physician) and Advanced Practice Providers (APPs -  Physician Assistants and Nurse Practitioners) who all work together to provide you with the care you need, when you need it.  We recommend signing up for the patient portal called "MyChart".  Sign up information is provided on this After Visit Summary.  MyChart is used to connect with patients for Virtual Visits (Telemedicine).  Patients are able to view lab/test results, encounter notes, upcoming appointments, etc.  Non-urgent messages can be sent to your provider as well.   To learn more about what you can do with MyChart, go to [email or web address].

## 2022-08-10 LAB — BASIC METABOLIC PANEL
BUN/Creatinine Ratio: 13 (ref 12–28)
BUN: 14 mg/dL (ref 8–27)
CO2: 22 mmol/L (ref 20–29)
Calcium: 9.4 mg/dL (ref 8.7–10.3)
Chloride: 95 mmol/L — ABNORMAL LOW (ref 96–106)
Creatinine, Ser: 1.06 mg/dL — ABNORMAL HIGH (ref 0.57–1.00)
Glucose: 92 mg/dL (ref 70–99)
Potassium: 3.7 mmol/L (ref 3.5–5.2)
Sodium: 135 mmol/L (ref 134–144)
eGFR: 56 mL/min/{1.73_m2} — ABNORMAL LOW (ref >59)

## 2022-08-10 LAB — PRO B NATRIURETIC PEPTIDE: NT-Pro BNP: 5848 pg/mL — ABNORMAL HIGH (ref 0–301)

## 2022-08-12 ENCOUNTER — Encounter: Payer: Self-pay | Admitting: Internal Medicine

## 2022-08-12 ENCOUNTER — Telehealth: Payer: Self-pay

## 2022-08-12 DIAGNOSIS — I10 Essential (primary) hypertension: Secondary | ICD-10-CM

## 2022-08-12 DIAGNOSIS — E878 Other disorders of electrolyte and fluid balance, not elsewhere classified: Secondary | ICD-10-CM

## 2022-08-12 DIAGNOSIS — Z79899 Other long term (current) drug therapy: Secondary | ICD-10-CM

## 2022-08-12 MED ORDER — POTASSIUM CHLORIDE ER 10 MEQ PO TBCR: 10.0000 meq | EXTENDED_RELEASE_TABLET | Freq: Every day | ORAL | 3 refills | Status: DC

## 2022-08-12 MED ORDER — FUROSEMIDE 40 MG PO TABS: 40.0000 mg | ORAL_TABLET | Freq: Every day | ORAL | 3 refills | Status: DC

## 2022-08-12 NOTE — Telephone Encounter (Signed)
-----  Message from Dorris Carnes V, MD sent at 08/11/2022 10:38 PM EST ----- FLuid is up some  I would recomm lasix 40 mg daily with 10 KCL Follow up BMET and BNP in 10 days

## 2022-08-12 NOTE — Telephone Encounter (Signed)
The patient has been notified of the result and verbalized understanding.  All questions (if any) were answered. Stephani Police, RN 08/12/2022 11:04 AM

## 2022-08-14 ENCOUNTER — Encounter: Payer: Self-pay | Admitting: Internal Medicine

## 2022-08-14 ENCOUNTER — Ambulatory Visit: Payer: Medicare PPO

## 2022-08-15 ENCOUNTER — Ambulatory Visit: Payer: Self-pay

## 2022-08-15 ENCOUNTER — Ambulatory Visit: Payer: Medicare PPO | Admitting: Physical Medicine and Rehabilitation

## 2022-08-15 VITALS — BP 128/85 | HR 91

## 2022-08-15 DIAGNOSIS — I10 Essential (primary) hypertension: Secondary | ICD-10-CM | POA: Diagnosis not present

## 2022-08-15 DIAGNOSIS — I4891 Unspecified atrial fibrillation: Secondary | ICD-10-CM | POA: Diagnosis not present

## 2022-08-15 DIAGNOSIS — M5416 Radiculopathy, lumbar region: Secondary | ICD-10-CM | POA: Diagnosis not present

## 2022-08-15 MED ORDER — METHYLPREDNISOLONE ACETATE 80 MG/ML IJ SUSP: 80.0000 mg | Freq: Once | INTRAMUSCULAR | Status: DC

## 2022-08-15 NOTE — Patient Instructions (Signed)

## 2022-08-15 NOTE — Progress Notes (Signed)
Functional Pain Scale - descriptive words and definitions  Distracting (5)    Aware of pain/able to complete some ADL's but limited by pain/sleep is affected and active distractions are only slightly useful. Moderate range order  Average Pain 5 when walking   +Driver, -BT, -Dye Allergies.  Pain from hips to knees

## 2022-08-19 ENCOUNTER — Encounter: Payer: Self-pay | Admitting: Physical Medicine and Rehabilitation

## 2022-08-19 MED ORDER — METHYLPREDNISOLONE ACETATE 80 MG/ML IJ SUSP: 40.0000 mg | Freq: Once | INTRAMUSCULAR | Status: DC

## 2022-08-19 NOTE — Progress Notes (Signed)
Leslie [name] - 72 y.o. female MRN [id number]  Date of birth: [birth year]  Office Visit Note: Visit Date: 08/06/2022 PCP: Isaac Bliss, Rayford Halsted, MD Referred by: Isaac Bliss, [place]*  Subjective: Chief Complaint  Patient presents with   Lower Back - Pain   HPI: Leslie [name] is a 72 y.o. female who comes in todayContinued severe pain in both legs and more of a posterior lateral distribution as before.  She reports back pain has improved since injection performed on 07/30/2022.  This was a bilateral transforaminal epidural injection.  She had this injection for leading issues.  She reports increased shortness of breath with walking.  No increased swelling or other issues.  No history of COPD or heart disease noted.  She does feel some better today in terms of shortness of breath.  She had questions about continued pain and symptoms postinjection.     Review of Systems  Respiratory:  Positive for shortness of breath.   Musculoskeletal:  Positive for back pain and joint pain.  All other systems reviewed and are negative.  Otherwise per HPI.  Assessment & Plan: Visit Diagnoses:    ICD-10-CM   1. Spinal stenosis of lumbar region with neurogenic claudication  M48.062     2. Spondylosis without myelopathy or radiculopathy, lumbar region  M47.816     3. Lumbar radiculopathy  M54.16 Ambulatory referral to Physical Medicine Rehab       Plan: Findings:  Chronic history of low back pain and bilateral leg pain with history of severe lumbar stenosis at L4-5 and typical relief with epidural injection.  Postinjection has some symptoms of shortness of breath and continued pain in the legs but improved back pain.  I think the neck step is to repeat the injection to see if we can get some relief of her leg pain which she has had in the past.  Injections have helped her.  Consider MRI if needed versus surgical consultation for lumbar stenosis.  In terms of her shortness of breath the  injection itself really should not cause that although it can cause heart palpitation type feelings if there was side effects from the steroid medication itself.  She should follow-up with her primary care physician if this continues I do not think this is from the injection itself.    Meds & Orders: No orders of the defined types were placed in this encounter.   Orders Placed This Encounter  Procedures   Ambulatory referral to Physical Medicine Rehab    Follow-up: No follow-ups on file.   Procedures: No procedures performed      Clinical History: No specialty comments available.   She reports that she quit smoking about 30 years ago. Her smoking use included cigarettes. She has never used smokeless tobacco. No results for input(s): "HGBA1C", "LABURIC" in the last 8760 hours.  Objective:  VS:  HT:    WT:   BMI:     BP:   HR: bpm  TEMP: ( )  RESP:  Physical Exam Vitals and nursing note reviewed.  Constitutional:      General: She is not in acute distress.    Appearance: Normal appearance. She is well-developed. She is not ill-appearing.  HENT:     Head: Normocephalic and atraumatic.     Right Ear: External ear normal.     Left Ear: External ear normal.     Nose: Nose normal.     Mouth/Throat:     Mouth: Mucous membranes are  moist.     Pharynx: Oropharynx is clear.  Eyes:     Extraocular Movements: Extraocular movements intact.     Conjunctiva/sclera: Conjunctivae normal.     Pupils: Pupils are equal, round, and reactive to light.  Cardiovascular:     Rate and Rhythm: Normal rate and regular rhythm.     Pulses: Normal pulses.  Pulmonary:     Effort: Pulmonary effort is normal. No respiratory distress.  Abdominal:     General: There is no distension.     Palpations: Abdomen is soft.     Tenderness: There is no guarding.  Musculoskeletal:        General: Tenderness present.     Cervical back: Normal range of motion and neck supple.     Right lower leg: No edema.      Left lower leg: No edema.     Comments: Patient has good distal strength with no pain over the greater trochanters.  No clonus or focal weakness.  Skin:    General: Skin is warm and dry.     Findings: No erythema, lesion or rash.  Neurological:     General: No focal deficit present.     Mental Status: She is alert and oriented to person, place, and time.     Sensory: No sensory deficit.     Motor: No weakness or abnormal muscle tone.     Coordination: Coordination normal.     Gait: Gait normal.  Psychiatric:        Mood and Affect: Mood normal.        Behavior: Behavior normal.        Thought Content: Thought content normal.     Ortho Exam  Imaging: No results found.  Past Medical/Family/Surgical/Social History: Medications & Allergies reviewed per EMR, new medications updated. Patient Active Problem List   Diagnosis Date Noted   Acute CHF (congestive heart failure) ([place]) 08/07/2022   Status post right knee replacement 05/04/2021   Vitamin B12 deficiency 03/14/2021   Macrocytosis without anemia 03/14/2021   Vitamin D deficiency 02/17/2020   Sleep disorder 07/10/2018   ADHD (attention deficit hyperactivity disorder) 10/01/2017   Unilateral primary osteoarthritis, right knee 08/20/2017   Chronic pain of both shoulders 08/20/2017   Complete tear of right rotator cuff 04/29/2017   Osteoarthritis of left knee 06/16/2015   Status post total left knee replacement 06/16/2015   Attention deficit disorder 09/14/2007   COLONIC POLYPS, HX OF 08/03/2007   Essential hypertension 01/20/2007   OBESITY NOS 12/04/2006   Allergic rhinitis 12/04/2006   GASTROJEJUNOSTOMY, HX OF 12/04/2006   BREAST BIOPSY, HX OF 12/04/2006   Past Medical History:  Diagnosis Date   ADD (attention deficit disorder)    Allergy    Anemia    Arthritis 07/22/2014   osteoarthritis left knee   Colon polyps    Hypertension    Left knee pain    chronic   Narcotic addiction ([place])    Obesity    post  gastric bypass   Vitamin D deficiency    Family History  Problem Relation Age of Onset   Heart disease Mother        post CABG history of CHF   COPD Father    Diabetes Sister    Hypertension Sister        post renal transplant   Heart disease Brother        CAD   Past Surgical History:  Procedure Laterality Date   arthscopic knee surgery  Left    several times   BREAST BIOPSY     BREAST SURGERY  years ago   breast biopsy   CHOLECYSTECTOMY     EYE SURGERY Bilateral 2013   Toric Lens implants   EYE SURGERY Right 2014   corneal amniotic membrane   GASTRIC BYPASS  2003   KIDNEY DONATION  2004   TOTAL KNEE ARTHROPLASTY Left 06/16/2015   Procedure: LEFT TOTAL KNEE ARTHROPLASTY;  Surgeon: Kathryne Hitch, MD;  Location: [facility];  Service: Orthopedics;  Laterality: Left;   TOTAL KNEE ARTHROPLASTY Right 05/04/2021   Procedure: RIGHT TOTAL KNEE ARTHROPLASTY;  Surgeon: Kathryne Hitch, MD;  Location: [facility];  Service: Orthopedics;  Laterality: Right;   Social History   Occupational History   Not on file  Tobacco Use   Smoking status: Former    Types: Cigarettes    Quit date: 07/22/1992    Years since quitting: 30.0   Smokeless tobacco: Never  Vaping Use   Vaping Use: Never used  Substance and Sexual Activity   Alcohol use: Yes    Comment: wine weekly   Drug use: No   Sexual activity: Not on file

## 2022-08-19 NOTE — Progress Notes (Signed)
Leslie [name] - 72 y.o. female MRN [id number]  Date of birth: [birth year]  Office Visit Note: Visit Date: 08/15/2022 PCP: Isaac Bliss, Rayford Halsted, MD Referred by: Isaac Bliss, Estel*  Subjective: Chief Complaint  Patient presents with   Lower Back - Pain   HPI:  Leslie [name] is a 72 y.o. female who comes in today at the request of Barnet Pall, FNP for planned Bilateral L4-5 Lumbar Transforaminal epidural steroid injection with fluoroscopic guidance.  The patient has failed conservative care including home exercise, medications, time and activity modification.  This injection will be diagnostic and hopefully therapeutic.  Please see requesting physician notes for further details and justification.  See prior office note for further details and justification.  Patient was also seen at the emergency room for her shortness of breath since last time I saw her and they felt like she had some new onset of congestive heart failure.  Did use 40 mg of steroid instead of 80 which should not be any change in efficacy.   ROS Otherwise per HPI.  Assessment & Plan: Visit Diagnoses:    ICD-10-CM   1. Lumbar radiculopathy  M54.16 XR C-ARM NO REPORT    Epidural Steroid injection    DISCONTINUED: methylPREDNISolone acetate (DEPO-MEDROL) injection 80 mg    DISCONTINUED: methylPREDNISolone acetate (DEPO-MEDROL) injection 40 mg      Plan: No additional findings.   Meds & Orders:  Meds ordered this encounter  Medications   DISCONTD: methylPREDNISolone acetate (DEPO-MEDROL) injection 80 mg   DISCONTD: methylPREDNISolone acetate (DEPO-MEDROL) injection 40 mg    Orders Placed This Encounter  Procedures   XR C-ARM NO REPORT   Epidural Steroid injection    Follow-up: Return for visit to requesting provider as needed.   Procedures: No procedures performed  Lumbosacral Transforaminal Epidural Steroid Injection - Sub-Pedicular Approach with Fluoroscopic Guidance  Patient: Leslie [name]      Date of Birth: July 02, 1951 MRN: [id number] PCP: Isaac Bliss, Rayford Halsted, MD      Visit Date: 08/15/2022   Universal Protocol:    Date/Time: 08/15/2022  Consent Given By: the patient  Position: PRONE  Additional Comments: Vital signs were monitored before and after the procedure. Patient was prepped and draped in the usual sterile fashion. The correct patient, procedure, and site was verified.   Injection Procedure Details:   Procedure diagnoses: Lumbar radiculopathy [M54.16]    Meds Administered:  Meds ordered this encounter  Medications   DISCONTD: methylPREDNISolone acetate (DEPO-MEDROL) injection 80 mg   DISCONTD: methylPREDNISolone acetate (DEPO-MEDROL) injection 40 mg    Laterality: Bilateral  Location/Site: L4  Needle:5.0 in., 22 ga.  Short bevel or Quincke spinal needle  Needle Placement: Transforaminal  Findings:    -Comments: Excellent flow of contrast along the nerve, nerve root and into the epidural space.  Procedure Details: After squaring off the end-plates to get a true AP view, the C-arm was positioned so that an oblique view of the foramen as noted above was visualized. The target area is just inferior to the "nose of the scotty dog" or sub pedicular. The soft tissues overlying this structure were infiltrated with 2-3 ml. of 1% Lidocaine without Epinephrine.  The spinal needle was inserted toward the target using a "trajectory" view along the fluoroscope beam.  Under AP and lateral visualization, the needle was advanced so it did not puncture dura and was located close the 6 O'Clock position of the pedical in AP tracterory. Biplanar projections were used to  confirm position. Aspiration was confirmed to be negative for CSF and/or blood. A 1-2 ml. volume of Isovue-250 was injected and flow of contrast was noted at each level. Radiographs were obtained for documentation purposes.   After attaining the desired flow of contrast documented  above, a 0.5 to 1.0 ml test dose of 0.25% Marcaine was injected into each respective transforaminal space.  The patient was observed for 90 seconds post injection.  After no sensory deficits were reported, and normal lower extremity motor function was noted,   the above injectate was administered so that equal amounts of the injectate were placed at each foramen (level) into the transforaminal epidural space.   Additional Comments:  No complications occurred Dressing: 2 x 2 sterile gauze and Band-Aid    Post-procedure details: Patient was observed during the procedure. Post-procedure instructions were reviewed.  Patient left the clinic in stable condition.    Clinical History: No specialty comments available.     Objective:  VS:  HT:    WT:   BMI:     BP:128/85  HR:91bpm  TEMP: ( )  RESP:  Physical Exam Vitals and nursing note reviewed.  Constitutional:      General: She is not in acute distress.    Appearance: Normal appearance. She is not ill-appearing.  HENT:     Head: Normocephalic and atraumatic.     Right Ear: External ear normal.     Left Ear: External ear normal.  Eyes:     Extraocular Movements: Extraocular movements intact.  Cardiovascular:     Rate and Rhythm: Normal rate.     Pulses: Normal pulses.  Pulmonary:     Effort: Pulmonary effort is normal. No respiratory distress.  Abdominal:     General: There is no distension.     Palpations: Abdomen is soft.  Musculoskeletal:        General: Tenderness present.     Cervical back: Neck supple.     Right lower leg: No edema.     Left lower leg: No edema.     Comments: Patient has good distal strength with no pain over the greater trochanters.  No clonus or focal weakness.  Skin:    Findings: No erythema, lesion or rash.  Neurological:     General: No focal deficit present.     Mental Status: She is alert and oriented to person, place, and time.     Sensory: No sensory deficit.     Motor: No weakness or  abnormal muscle tone.     Coordination: Coordination normal.  Psychiatric:        Mood and Affect: Mood normal.        Behavior: Behavior normal.      Imaging: No results found.

## 2022-08-19 NOTE — Procedures (Signed)
Lumbosacral Transforaminal Epidural Steroid Injection - Sub-Pedicular Approach with Fluoroscopic Guidance  Patient: Leslie [name]      Date of Birth: September 30, 1950 MRN: [id number] PCP: Isaac Bliss, Rayford Halsted, MD      Visit Date: 08/15/2022   Universal Protocol:    Date/Time: 08/15/2022  Consent Given By: the patient  Position: PRONE  Additional Comments: Vital signs were monitored before and after the procedure. Patient was prepped and draped in the usual sterile fashion. The correct patient, procedure, and site was verified.   Injection Procedure Details:   Procedure diagnoses: Lumbar radiculopathy [M54.16]    Meds Administered:  Meds ordered this encounter  Medications   DISCONTD: methylPREDNISolone acetate (DEPO-MEDROL) injection 80 mg   DISCONTD: methylPREDNISolone acetate (DEPO-MEDROL) injection 40 mg    Laterality: Bilateral  Location/Site: L4  Needle:5.0 in., 22 ga.  Short bevel or Quincke spinal needle  Needle Placement: Transforaminal  Findings:    -Comments: Excellent flow of contrast along the nerve, nerve root and into the epidural space.  Procedure Details: After squaring off the end-plates to get a true AP view, the C-arm was positioned so that an oblique view of the foramen as noted above was visualized. The target area is just inferior to the "nose of the scotty dog" or sub pedicular. The soft tissues overlying this structure were infiltrated with 2-3 ml. of 1% Lidocaine without Epinephrine.  The spinal needle was inserted toward the target using a "trajectory" view along the fluoroscope beam.  Under AP and lateral visualization, the needle was advanced so it did not puncture dura and was located close the 6 O'Clock position of the pedical in AP tracterory. Biplanar projections were used to confirm position. Aspiration was confirmed to be negative for CSF and/or blood. A 1-2 ml. volume of Isovue-250 was injected and flow of contrast was noted at each  level. Radiographs were obtained for documentation purposes.   After attaining the desired flow of contrast documented above, a 0.5 to 1.0 ml test dose of 0.25% Marcaine was injected into each respective transforaminal space.  The patient was observed for 90 seconds post injection.  After no sensory deficits were reported, and normal lower extremity motor function was noted,   the above injectate was administered so that equal amounts of the injectate were placed at each foramen (level) into the transforaminal epidural space.   Additional Comments:  No complications occurred Dressing: 2 x 2 sterile gauze and Band-Aid    Post-procedure details: Patient was observed during the procedure. Post-procedure instructions were reviewed.  Patient left the clinic in stable condition.

## 2022-08-22 ENCOUNTER — Ambulatory Visit: Payer: Medicare PPO | Attending: Internal Medicine

## 2022-08-22 DIAGNOSIS — I10 Essential (primary) hypertension: Secondary | ICD-10-CM

## 2022-08-22 DIAGNOSIS — E878 Other disorders of electrolyte and fluid balance, not elsewhere classified: Secondary | ICD-10-CM | POA: Diagnosis not present

## 2022-08-22 DIAGNOSIS — Z79899 Other long term (current) drug therapy: Secondary | ICD-10-CM | POA: Diagnosis not present

## 2022-08-24 LAB — BASIC METABOLIC PANEL
BUN/Creatinine Ratio: 36 — ABNORMAL HIGH (ref 12–28)
BUN: 58 mg/dL — ABNORMAL HIGH (ref 8–27)
CO2: 26 mmol/L (ref 20–29)
Calcium: 10.8 mg/dL — ABNORMAL HIGH (ref 8.7–10.3)
Chloride: 84 mmol/L — ABNORMAL LOW (ref 96–106)
Creatinine, Ser: 1.63 mg/dL — ABNORMAL HIGH (ref 0.57–1.00)
Glucose: 135 mg/dL — ABNORMAL HIGH (ref 70–99)
Potassium: 3.6 mmol/L (ref 3.5–5.2)
Sodium: 134 mmol/L (ref 134–144)
eGFR: 33 mL/min/{1.73_m2} — ABNORMAL LOW (ref >59)

## 2022-08-24 LAB — PRO B NATRIURETIC PEPTIDE: NT-Pro BNP: 6931 pg/mL — ABNORMAL HIGH (ref 0–301)

## 2022-08-26 ENCOUNTER — Telehealth: Payer: Self-pay

## 2022-08-26 DIAGNOSIS — I10 Essential (primary) hypertension: Secondary | ICD-10-CM

## 2022-08-26 DIAGNOSIS — E878 Other disorders of electrolyte and fluid balance, not elsewhere classified: Secondary | ICD-10-CM

## 2022-08-26 DIAGNOSIS — Z79899 Other long term (current) drug therapy: Secondary | ICD-10-CM

## 2022-08-26 DIAGNOSIS — I4891 Unspecified atrial fibrillation: Secondary | ICD-10-CM

## 2022-08-26 MED ORDER — POTASSIUM CHLORIDE CRYS ER 10 MEQ PO TBCR: EXTENDED_RELEASE_TABLET | ORAL | 3 refills | Status: DC

## 2022-08-26 MED ORDER — FUROSEMIDE 40 MG PO TABS: ORAL_TABLET | ORAL | 3 refills | Status: DC

## 2022-08-26 NOTE — Telephone Encounter (Signed)
Left a message for the pt and sent her a My Chart.  °

## 2022-08-26 NOTE — Telephone Encounter (Signed)
-----   Message from Dorris Carnes V, MD sent at 08/26/2022  8:30 AM EST ----- Cr is up from 2 wks ago   HOld lasx and K for 2 days then resume every other day    Fluid does appear to still be up Echo is ordered this weeek    Get BMET and BNP in 2 wks

## 2022-08-27 NOTE — Telephone Encounter (Signed)
Pt advised her lab results and will have repeat labs 09/09/22.

## 2022-08-29 ENCOUNTER — Ambulatory Visit (HOSPITAL_COMMUNITY): Payer: Medicare PPO | Attending: Internal Medicine

## 2022-08-29 DIAGNOSIS — I10 Essential (primary) hypertension: Secondary | ICD-10-CM | POA: Diagnosis not present

## 2022-08-29 LAB — ECHOCARDIOGRAM COMPLETE
Area-P 1/2: 4.64 cm2
P 1/2 time: 474 ms
S' Lateral: 4.95 cm

## 2022-08-30 ENCOUNTER — Telehealth: Payer: Self-pay

## 2022-08-30 ENCOUNTER — Other Ambulatory Visit: Payer: Self-pay | Admitting: Internal Medicine

## 2022-08-30 ENCOUNTER — Ambulatory Visit: Payer: Medicare PPO | Attending: Internal Medicine

## 2022-08-30 DIAGNOSIS — I10 Essential (primary) hypertension: Secondary | ICD-10-CM

## 2022-08-30 DIAGNOSIS — I509 Heart failure, unspecified: Secondary | ICD-10-CM

## 2022-08-30 DIAGNOSIS — Z79899 Other long term (current) drug therapy: Secondary | ICD-10-CM

## 2022-08-30 DIAGNOSIS — I4891 Unspecified atrial fibrillation: Secondary | ICD-10-CM | POA: Diagnosis not present

## 2022-08-30 DIAGNOSIS — G479 Sleep disorder, unspecified: Secondary | ICD-10-CM

## 2022-08-30 NOTE — Telephone Encounter (Signed)
Spoke with pt and appointment scheduled for lab work as requested by Dr Harrington Challenger for today 08/30/2022.  Pt verbalizes understanding and agrees with current plan.

## 2022-08-30 NOTE — Telephone Encounter (Signed)
-----   Message from Fay Records, MD sent at 08/29/2022  7:11 PM EST ----- Reviewed with patient the echo result Need monitor that she wore to see rhythm, ectopy, rates I would recomm R and L heart cath to define anatomy.pressures For now would recomm BMET and BNP , TSH CBC  Pt says she lost 20 lbs with lasix Don't order cath yet until I review monitor Note:  Pt's ex husband died today   Lives in [facility]   She would like to go    Please set labs for tomorrow (Friday ) or Monday

## 2022-08-31 ENCOUNTER — Telehealth: Payer: Self-pay | Admitting: Cardiology

## 2022-08-31 LAB — BASIC METABOLIC PANEL WITH GFR
BUN/Creatinine Ratio: 26 (ref 12–28)
BUN: 31 mg/dL — ABNORMAL HIGH (ref 8–27)
CO2: 28 mmol/L (ref 20–29)
Calcium: 9.7 mg/dL (ref 8.7–10.3)
Chloride: 91 mmol/L — ABNORMAL LOW (ref 96–106)
Creatinine, Ser: 1.17 mg/dL — ABNORMAL HIGH (ref 0.57–1.00)
Glucose: 127 mg/dL — ABNORMAL HIGH (ref 70–99)
Potassium: 2.8 mmol/L — CL (ref 3.5–5.2)
Sodium: 137 mmol/L (ref 134–144)
eGFR: 50 mL/min/1.73 — ABNORMAL LOW (ref >59)

## 2022-08-31 LAB — CBC
Hematocrit: 40.2 % (ref 34.0–46.6)
Hemoglobin: 13.8 g/dL (ref 11.1–15.9)
MCH: 34.2 pg — ABNORMAL HIGH (ref 26.6–33.0)
MCHC: 34.3 g/dL (ref 31.5–35.7)
MCV: 100 fL — ABNORMAL HIGH (ref 79–97)
Platelets: 284 10*3/uL (ref 150–450)
RBC: 4.04 x10E6/uL (ref 3.77–5.28)
RDW: 12.7 % (ref 11.7–15.4)
WBC: 8.9 10*3/uL (ref 3.4–10.8)

## 2022-08-31 LAB — TSH: TSH: 1.82 u[IU]/mL (ref 0.450–4.500)

## 2022-08-31 LAB — PRO B NATRIURETIC PEPTIDE: NT-Pro BNP: 3625 pg/mL — ABNORMAL HIGH (ref 0–301)

## 2022-08-31 NOTE — Telephone Encounter (Signed)
Notified by [facility] that patient had a critical lab value on her BMP that was drawn yesterday. K 2.8. Called patient, discussed findings and that hypokalemia to this extent is dangerous and could cause life threatening arrhythmias. Encouraged patient to go to the ED for evaluation. Patient voiced understanding and was in agreement.   Margie Billet, PA-C 08/31/2022 9:09 AM

## 2022-09-02 ENCOUNTER — Telehealth: Payer: Self-pay

## 2022-09-02 DIAGNOSIS — Z79899 Other long term (current) drug therapy: Secondary | ICD-10-CM

## 2022-09-02 DIAGNOSIS — I10 Essential (primary) hypertension: Secondary | ICD-10-CM

## 2022-09-02 MED ORDER — POTASSIUM CHLORIDE CRYS ER 20 MEQ PO TBCR: 20.0000 meq | EXTENDED_RELEASE_TABLET | Freq: Every day | ORAL | 3 refills | Status: DC

## 2022-09-02 NOTE — Telephone Encounter (Signed)
-----   Message from Fay Records, MD sent at 09/01/2022 10:07 PM EST ----- K is low     It does not appear that pt seen in ER I would stop lasix for a couple days Switch KCL to 20 meQ    Take 4 tabs (40 +40 ) day one    Then take 40 Meq Day 2 Then resume lasix 40 with KCL 20 Meq every day Needs BMET on Tuesday  Send stat

## 2022-09-02 NOTE — Telephone Encounter (Signed)
The patient has been notified of the result and verbalized understanding.  All questions (if any) were answered. Bernestine Amass, RN 09/02/2022 9:28 AM

## 2022-09-04 ENCOUNTER — Other Ambulatory Visit: Payer: Medicare PPO

## 2022-09-05 ENCOUNTER — Ambulatory Visit: Payer: Medicare PPO | Attending: Internal Medicine

## 2022-09-05 DIAGNOSIS — Z79899 Other long term (current) drug therapy: Secondary | ICD-10-CM | POA: Diagnosis not present

## 2022-09-05 DIAGNOSIS — I10 Essential (primary) hypertension: Secondary | ICD-10-CM | POA: Diagnosis not present

## 2022-09-06 LAB — BASIC METABOLIC PANEL
BUN/Creatinine Ratio: 19 (ref 12–28)
BUN: 20 mg/dL (ref 8–27)
CO2: 23 mmol/L (ref 20–29)
Calcium: 9.5 mg/dL (ref 8.7–10.3)
Chloride: 94 mmol/L — ABNORMAL LOW (ref 96–106)
Creatinine, Ser: 1.08 mg/dL — ABNORMAL HIGH (ref 0.57–1.00)
Glucose: 89 mg/dL (ref 70–99)
Potassium: 4.1 mmol/L (ref 3.5–5.2)
Sodium: 135 mmol/L (ref 134–144)
eGFR: 55 mL/min/{1.73_m2} — ABNORMAL LOW (ref >59)

## 2022-09-09 ENCOUNTER — Other Ambulatory Visit: Payer: Medicare PPO

## 2022-09-09 ENCOUNTER — Telehealth: Payer: Self-pay

## 2022-09-09 DIAGNOSIS — Z79899 Other long term (current) drug therapy: Secondary | ICD-10-CM

## 2022-09-09 DIAGNOSIS — Z01810 Encounter for preprocedural cardiovascular examination: Secondary | ICD-10-CM

## 2022-09-09 DIAGNOSIS — I4891 Unspecified atrial fibrillation: Secondary | ICD-10-CM

## 2022-09-09 DIAGNOSIS — E878 Other disorders of electrolyte and fluid balance, not elsewhere classified: Secondary | ICD-10-CM

## 2022-09-09 DIAGNOSIS — I509 Heart failure, unspecified: Secondary | ICD-10-CM

## 2022-09-09 DIAGNOSIS — I10 Essential (primary) hypertension: Secondary | ICD-10-CM

## 2022-09-09 MED ORDER — POTASSIUM CHLORIDE CRYS ER 20 MEQ PO TBCR: 20.0000 meq | EXTENDED_RELEASE_TABLET | Freq: Every day | ORAL | 3 refills | Status: DC

## 2022-09-09 MED ORDER — FUROSEMIDE 40 MG PO TABS: 40.0000 mg | ORAL_TABLET | Freq: Every day | ORAL | 3 refills | Status: DC

## 2022-09-09 MED ORDER — APIXABAN 5 MG PO TABS: 5.0000 mg | ORAL_TABLET | Freq: Two times a day (BID) | ORAL | 3 refills | Status: DC

## 2022-09-09 NOTE — Telephone Encounter (Signed)
Pt advised Dr Harrington Challenger' recommendations and will pick up some Eliquis asamples and 30 day coupon today... she will have labs this Thursday 09/12/22 and R/L Heart Cath on 09/19/22.

## 2022-09-09 NOTE — Telephone Encounter (Signed)
-----   Message from Dorris Carnes V, MD sent at 09/09/2022  2:07 PM EST ----- Sinus rhythm as well as what appears to be atrial fibrillation      I have reviewed with patinet     She says her breathing is OK   Does have some "shaky spells"   Not sure why  Recomm:  Eliquis 5 mg bid  I will review with EP

## 2022-09-10 NOTE — Telephone Encounter (Signed)
Pt to have labs and ECG with nurse 09/12/22.... Dr Harrington Challenger will update her H&P via phone call pior to her Cath.

## 2022-09-11 NOTE — Telephone Encounter (Signed)
Spoke with the pt again and clarified with her the upcoming appts...   Labs/ EKG 09/12/22 Cath 09/19/22 Cath instructions went over verbally and left at the front desk for her since she could not pull up on My Chart.   Cath arrival time changed to 8:30 am since Dr Harrington Challenger does not think she will need added hydration.

## 2022-09-12 ENCOUNTER — Other Ambulatory Visit: Payer: Medicare PPO

## 2022-09-12 ENCOUNTER — Ambulatory Visit: Payer: Medicare PPO | Attending: Internal Medicine | Admitting: *Deleted

## 2022-09-12 ENCOUNTER — Ambulatory Visit: Payer: Medicare PPO

## 2022-09-12 VITALS — BP 116/70 | HR 94 | Resp 20 | Wt 181.0 lb

## 2022-09-12 DIAGNOSIS — I10 Essential (primary) hypertension: Secondary | ICD-10-CM | POA: Diagnosis not present

## 2022-09-12 DIAGNOSIS — Z79899 Other long term (current) drug therapy: Secondary | ICD-10-CM

## 2022-09-12 DIAGNOSIS — E878 Other disorders of electrolyte and fluid balance, not elsewhere classified: Secondary | ICD-10-CM | POA: Diagnosis not present

## 2022-09-12 DIAGNOSIS — Z01810 Encounter for preprocedural cardiovascular examination: Secondary | ICD-10-CM

## 2022-09-12 DIAGNOSIS — I4891 Unspecified atrial fibrillation: Secondary | ICD-10-CM | POA: Diagnosis not present

## 2022-09-12 DIAGNOSIS — I509 Heart failure, unspecified: Secondary | ICD-10-CM | POA: Diagnosis not present

## 2022-09-12 NOTE — Progress Notes (Signed)
   Nurse Visit   Date of Encounter: 09/12/2022 ID: Leslie [name], DOB 23-Dec-1950, MRN [id number]  PCP:  Isaac Bliss, [place] [facility] Cardiologist:  Dr. Dorris Carnes      Visit Details   VS:  BP 116/70 (BP Location: Left Arm)   Pulse 94   Resp 20   Wt 181 lb 0.4 oz (82.1 kg)   SpO2 97%   BMI 31.07 kg/m  , BMI Body mass index is 31.07 kg/m.  Wt Readings from Last 3 Encounters:  09/12/22 181 lb 0.4 oz (82.1 kg)  08/09/22 191 lb 9.6 oz (86.9 kg)  08/08/22 189 lb (85.7 kg)     Reason for visit: RN Visit / EKG for Heart Cath on 09/19/2022 Performed today:  WT , Vitals, EKG, Provider consulted:DOD, Dr. Elliot Cousin, and Education Changes (medications, testing, etc.) : None Length of Visit: 30 minutes    Medications Adjustments/Labs and Tests Ordered: Pt arrived for RN visit / EKG for upcoming Heart Catheterization on 09/19/2022.  Pt received her Heart Cath instructions from the front desk, and already had labs drawn prior to my assessment of her weight / vital signs.  Pt was brought back to the room at 310 pm on 09/12/2022.  Pt vital signs were WNL.  Unknown to me, Pt had a skin tear on her left forearm.  It was covered by a bandage, but Pt wanted to show it to me.  She removed the bandaid, and the top layer of skin was missing / bleeding.  It was a little over an inch long.  I applied triple antibiotic ointment, a gauze dressing, and covered it with a large bandage.    After applying the bandage, I performed an EKG on the patient.  The EKG read, Vent rate of 100 bpm, PR interval of 150 ms, QRS duration of 96 ms, and QT / Qtc 360 / 464.  The EKG was taken to DOD, Dr. Elliot Cousin for review.  Dr. Curt Bears made aware this is a Dr. Harrington Challenger Pt, and the EKG visit was for her upcoming Heart Cath on 09/19/2022.  No new orders were received from Dr. Curt Bears. The EKG orders will be entered, and EKG scanned into the system.       Signed, Varney Daily, RN   09/12/2022 4:05 PM

## 2022-09-13 LAB — CBC
Hematocrit: 39.1 % (ref 34.0–46.6)
Hemoglobin: 13.6 g/dL (ref 11.1–15.9)
MCH: 33.9 pg — ABNORMAL HIGH (ref 26.6–33.0)
MCHC: 34.8 g/dL (ref 31.5–35.7)
MCV: 98 fL — ABNORMAL HIGH (ref 79–97)
Platelets: 318 10*3/uL (ref 150–450)
RBC: 4.01 x10E6/uL (ref 3.77–5.28)
RDW: 12.4 % (ref 11.7–15.4)
WBC: 7.5 10*3/uL (ref 3.4–10.8)

## 2022-09-13 LAB — BASIC METABOLIC PANEL
BUN/Creatinine Ratio: 16 (ref 12–28)
BUN: 18 mg/dL (ref 8–27)
CO2: 23 mmol/L (ref 20–29)
Calcium: 9.6 mg/dL (ref 8.7–10.3)
Chloride: 92 mmol/L — ABNORMAL LOW (ref 96–106)
Creatinine, Ser: 1.16 mg/dL — ABNORMAL HIGH (ref 0.57–1.00)
Glucose: 105 mg/dL — ABNORMAL HIGH (ref 70–99)
Potassium: 3.2 mmol/L — ABNORMAL LOW (ref 3.5–5.2)
Sodium: 135 mmol/L (ref 134–144)
eGFR: 50 mL/min/{1.73_m2} — ABNORMAL LOW (ref >59)

## 2022-09-13 LAB — PRO B NATRIURETIC PEPTIDE: NT-Pro BNP: 1775 pg/mL — ABNORMAL HIGH (ref 0–301)

## 2022-09-17 ENCOUNTER — Ambulatory Visit: Payer: Medicare PPO | Admitting: Physician Assistant

## 2022-09-17 ENCOUNTER — Telehealth: Payer: Self-pay | Admitting: *Deleted

## 2022-09-17 NOTE — Telephone Encounter (Signed)
Cardiac Catheterization scheduled at [facility] for: Thursday September 19, 2022 10:30 AM Arrival time and place: [facility] Main Entrance A at: 8 AM-needs BMP  Nothing to eat after midnight prior to procedure, clear liquids until 5 AM day of procedure.  Medication instructions: -Hold:  Eliquis-none 09/17/22 until post procedure  Torsemide-day before and day of procedure-per protocol GFR 50  (Pt reports not currently taking HCTZ) -Other usual morning medications can be taken with sips of water including aspirin 81 mg.  Confirmed patient has responsible adult to drive home post procedure and be with patient first 24 hours after arriving home.  Patient reports no new symptoms concerning for COVID-19 in the past 10 days.  Reviewed procedure instructions with patient.

## 2022-09-17 NOTE — H&P (Signed)
Cardiology Admission History and Physical   Patient ID: Leslie [name] MRN: [id number]; DOB: May 26, 1951   Admission date: (Not on file)  PCP:  Isaac Bliss, Rayford Halsted, [facility] Cardiologist: Harrington Challenger  Patient Profile:   Leslie [name] is a 72 y.o. female with LV dysfuncton  who is being set up for R/L heart catheterization on 09/19/22  History of Present Illness:   Leslie [name] is a 72 yo who presented to ER in Jan 2024 with SOB   CXR showed pulmonary edema   CT angiogram neg for PE   Pt treated with IV lasix   Went home when no beds available      The pt was in cardiology clinic on 08/09/22 with continued SOB    Three months prior she said her breathing was find    She had taken a few doses of lasix 20 mg but had no more   EKG appeared to be an MAT with HR 102 bpm Echo on 08/29/22 showed  LVEF 25 to 30%  RVEF normal   Mod LAE    Seven day monitor showed SR and atrial fibrillation  Average HR 94 bpm    The pt was placed on Eliquis    I spoke to the pt on the telephone about results    Given severe LV dysfunction and symptoms I recomm an R and a L heart cath to evaluate for CAD as cause for LV dysfunction and also to eval LV/RV filling pressures    Past Medical History:  Diagnosis Date   ADD (attention deficit disorder)    Allergy    Anemia    Arthritis 07/22/2014   osteoarthritis left knee   Colon polyps    Hypertension    Left knee pain    chronic   Narcotic addiction ([place])    Obesity    post gastric bypass   Vitamin D deficiency     Past Surgical History:  Procedure Laterality Date   arthscopic knee surgery Left    several times   BREAST BIOPSY     BREAST SURGERY  years ago   breast biopsy   CHOLECYSTECTOMY     EYE SURGERY Bilateral 2013   Toric Lens implants   EYE SURGERY Right 2014   corneal amniotic membrane   GASTRIC BYPASS  2003   KIDNEY DONATION  2004   TOTAL KNEE ARTHROPLASTY Left 06/16/2015   Procedure: LEFT TOTAL KNEE  ARTHROPLASTY;  Surgeon: Mcarthur Rossetti, MD;  Location: [facility];  Service: Orthopedics;  Laterality: Left;   TOTAL KNEE ARTHROPLASTY Right 05/04/2021   Procedure: RIGHT TOTAL KNEE ARTHROPLASTY;  Surgeon: Mcarthur Rossetti, MD;  Location: [facility];  Service: Orthopedics;  Laterality: Right;     Medications Prior to Admission: Prior to Admission medications   Medication Sig Start Date End Date Taking? Authorizing Provider  ALPRAZolam (XANAX) 0.25 MG tablet TAKE 1 TABLET(0.25 MG) BY MOUTH AT BEDTIME AS NEEDED FOR ANXIETY Patient taking differently: Take 0.25 mg by mouth daily as needed (anxiety). 09/02/22  Yes Isaac Bliss, Rayford Halsted, MD  amphetamine-dextroamphetamine (ADDERALL XR) 30 MG 24 hr capsule Take 1 capsule (30 mg total) by mouth every morning. 07/04/22  Yes Erline Hau, MD  amphetamine-dextroamphetamine (ADDERALL) 20 MG tablet Take 1 tablet (20 mg total) by mouth daily. Patient taking differently: Take 20 mg by mouth daily as needed (attention/focus). Afternoon/evening 07/04/22  Yes Isaac Bliss, Rayford Halsted, MD  apixaban Arne [place])  5 MG TABS tablet Take 1 tablet (5 mg total) by mouth 2 (two) times daily. 09/09/22  Yes Fay Records, MD  FLUoxetine (PROZAC) 40 MG capsule TAKE 1 CAPSULE(40 MG) BY MOUTH EVERY MORNING 07/03/22  Yes Isaac Bliss, Rayford Halsted, MD  furosemide (LASIX) 40 MG tablet Take 1 tablet (40 mg total) by mouth daily. 09/09/22  Yes Fay Records, MD  lamoTRIgine (LAMICTAL) 100 MG tablet TAKE 1 TABLET(100 MG) BY MOUTH TWICE DAILY 04/22/22  Yes Isaac Bliss, Rayford Halsted, MD  Multiple Vitamins-Minerals (PRESERVISION AREDS PO) Take 1 tablet by mouth 2 (two) times daily.   Yes [provider]  potassium chloride SA (KLOR-CON M) 20 MEQ tablet Take 1 tablet (20 mEq total) by mouth daily. Patient taking differently: Take 20 mEq by mouth 2 (two) times daily. 09/09/22  Yes Fay Records, MD  pseudoephedrine (SUDAFED) 30 MG tablet Take 30 mg by mouth  every 4 (four) hours as needed for congestion.   Yes [provider]  zolpidem (AMBIEN) 10 MG tablet TAKE 1 TABLET(10 MG) BY MOUTH AT BEDTIME Patient taking differently: Take 10 mg by mouth at bedtime as needed for sleep. 08/01/22  Yes Erline Hau, MD  amphetamine-dextroamphetamine (ADDERALL XR) 30 MG 24 hr capsule Take 1 capsule (30 mg total) by mouth every morning. Patient not taking: Reported on 09/16/2022 06/06/22   Isaac Bliss, Rayford Halsted, MD  amphetamine-dextroamphetamine (ADDERALL XR) 30 MG 24 hr capsule Take 1 capsule (30 mg total) by mouth every morning. Patient not taking: Reported on 09/16/2022 07/04/22   Isaac Bliss, Rayford Halsted, MD  amphetamine-dextroamphetamine (ADDERALL XR) 30 MG 24 hr capsule Take 1 capsule (30 mg total) by mouth every morning. Patient not taking: Reported on 09/16/2022 07/04/22   Isaac Bliss, Rayford Halsted, MD  amphetamine-dextroamphetamine (ADDERALL) 20 MG tablet Take 1 tablet (20 mg total) by mouth daily. Patient not taking: Reported on 09/16/2022 06/06/22   Isaac Bliss, Rayford Halsted, MD  amphetamine-dextroamphetamine (ADDERALL) 20 MG tablet Take 1 tablet (20 mg total) by mouth daily. Patient not taking: Reported on 09/16/2022 07/04/22   Isaac Bliss, Rayford Halsted, MD  amphetamine-dextroamphetamine (ADDERALL) 20 MG tablet Take 1 tablet (20 mg total) by mouth daily. Patient not taking: Reported on 09/16/2022 07/04/22   Isaac Bliss, Rayford Halsted, MD  gabapentin (NEURONTIN) 100 MG capsule TAKE 1 CAPSULE(100 MG) BY MOUTH THREE TIMES DAILY Patient not taking: Reported on 09/16/2022 03/11/22   Pete Pelt, PA-C  hydrochlorothiazide (HYDRODIURIL) 25 MG tablet TAKE 1 TABLET(25 MG) BY MOUTH DAILY Patient not taking: Reported on 09/16/2022 06/17/22   Isaac Bliss, Rayford Halsted, MD     Allergies:    Allergies  Allergen Reactions   Amoxicillin     Hives   Sulfamethoxazole Nausea And Vomiting    See patient list of med intolerances due to  h/o gastric bypass and nephrectomy    Social History:   Social History   Socioeconomic History   Marital status: Widowed    Spouse name: Not on file   Number of children: Not on file   Years of education: Not on file   Highest education level: Associate degree: academic program  Occupational History   Not on file  Tobacco Use   Smoking status: Former    Types: Cigarettes    Quit date: 07/22/1992    Years since quitting: 30.1   Smokeless tobacco: Never  Vaping Use   Vaping Use: Never used  Substance and Sexual Activity   Alcohol  use: Yes    Comment: wine weekly   Drug use: No   Sexual activity: Not on file  Other Topics Concern   Not on file  Social History Narrative   Not on file   Social Determinants of Health   Financial Resource Strain: Low Risk  (10/04/2021)   Overall Financial Resource Strain (CARDIA)    Difficulty of Paying Living Expenses: Not hard at all  Food Insecurity: No Food Insecurity (10/04/2021)   Hunger Vital Sign    Worried About Running Out of Food in the Last Year: Never true    Ran Out of Food in the Last Year: Never true  Transportation Needs: No Transportation Needs (10/04/2021)   PRAPARE - Hydrologist (Medical): No    Lack of Transportation (Non-Medical): No  Physical Activity: Unknown (10/04/2021)   Exercise Vital Sign    Days of Exercise per Week: 0 days    Minutes of Exercise per Session: Not on file  Stress: Not on file  Social Connections: Unknown (10/04/2021)   Social Connection and Isolation Panel [NHANES]    Frequency of Communication with Friends and Family: Not on file    Frequency of Social Gatherings with Friends and Family: Once a week    Attends Religious Services: More than 4 times per year    Active Member of [employer] or Organizations: Not on file    Attends Archivist Meetings: Not on file    Marital Status: Widowed  Intimate Partner Violence: Not on file    Family History:   The  patient's family history includes COPD in her father; Diabetes in her sister; Heart disease in her brother and mother; Hypertension in her sister.    ROS:  Please see the history of present illness.  All other ROS reviewed and negative.     Physical Exam/Data:  There were no vitals filed for this visit. No intake or output data in the 24 hours ending 09/17/22 1516    09/12/2022    4:04 PM 08/09/2022    4:19 PM 08/08/2022    3:16 PM  Last 3 Weights  Weight (lbs) 181 lb 0.4 oz 191 lb 9.6 oz 189 lb  Weight (kg) 82.112 kg 86.909 kg 85.73 kg    Exam from 08/09/22  Wt on 08/09/22 191 lb   BP 127/81  P 98   Sat 97% RA    General:  Well nourished, well developed, in no acute distress HEENT: normal Neck: no JVD Vascular: No carotid bruits; Distal pulses 2+ bilaterally   Cardiac:  RRR with frequent skip   Lungs:  clear to auscultation bilaterally, no wheezing, rhonchi or rales  Abd: soft, nontender, no hepatomegaly  Ext: noLE  edema Musculoskeletal:  No deformities, BUE and BLE strength normal and equal Skin: warm and dry  Neuro:  CNs 2-12 intact, no focal abnormalities noted Psych:  Normal affect    EKG:  The ECG that was done 08/09/22 was personally reviewed and demonstrates MAT 102 bpm  LVH with strain    Relevant CV Studies:  Monitor   Jan 2024   Patch Wear Time:  7 days and 3 hours (2024-01-25T12:08:04-499 to 2024-02-01T15:34:25-0500)   Sinus rhythm  Rates 79 to 124 bpm   Average HR 94 bpm Atrial fibrillation   6 episodes   (36% of time)  Longest episode lasted 2 days 10 hours    Rates 66 to 213 bpm) Frequent PVCs (7.8%)   Occasional PACs  Short bursts of SVT   Fastest for 5 beats at 203 bpm, longest 20 sec.   Echo  08/29/22    1. Left ventricular ejection fraction, by estimation, is 25 to 30%. The  left ventricle has severely decreased function. The left ventricle  demonstrates global hypokinesis. The left ventricular internal cavity size  was moderately dilated. Left   ventricular diastolic parameters were normal.   2. Right ventricular systolic function is normal. The right ventricular  size is normal.   3. Left atrial size was moderately dilated.   4. The mitral valve is abnormal. Mild mitral valve regurgitation. No  evidence of mitral stenosis.   5. The aortic valve is normal in structure. There is mild calcification  of the aortic valve. There is mild thickening of the aortic valve. Aortic  valve regurgitation is mild to moderate. Aortic valve sclerosis is  present, with no evidence of aortic  valve stenosis.   6. The inferior vena cava is normal in size with greater than 50%  respiratory variability, suggesting right atrial pressure of 3 mmHg.   Laboratory Data:  High Sensitivity Troponin:  No results for input(s): "TROPONINIHS" in the last 720 hours.    Chemistry Recent Labs  Lab 09/12/22 1437  NA 135  K 3.2*  CL 92*  CO2 23  GLUCOSE 105*  BUN 18  CREATININE 1.16*  CALCIUM 9.6    No results for input(s): "PROT", "ALBUMIN", "AST", "ALT", "ALKPHOS", "BILITOT" in the last 168 hours. Lipids No results for input(s): "CHOL", "TRIG", "HDL", "LABVLDL", "LDLCALC", "CHOLHDL" in the last 168 hours. Hematology Recent Labs  Lab 09/12/22 1437  WBC 7.5  RBC 4.01  HGB 13.6  HCT 39.1  MCV 98*  MCH 33.9*  MCHC 34.8  RDW 12.4  PLT 318   Thyroid No results for input(s): "TSH", "FREET4" in the last 168 hours. BNP Recent Labs  Lab 09/12/22 1437  PROBNP 1,775*    DDimer No results for input(s): "DDIMER" in the last 168 hours.   Radiology/Studies:  No results found.   Assessment and Plan:   1  HFrEF   Pt with newly diagnosed severe LV dysfunction   Etiology not clear    Recent monitor showed average HR in 90s   Should not explain LV dysfunction I would recomm R and L heart cat to define pressures/anatomy    Risks / benefits explained via phone call    Pt understands and agrees to proceed  2  Atrial fibrillation   This is a new dx  ofr patient   Eliquis started    Hold for heart catheterization    3  LIpids  LDL 95 in 2022  wil lreview after cath  4  Hx 1 kidney  Minimal contrast    No LV gram  5  Hx gastric bypass     For questions or updates, please contact [place] Please consult [email or web address] for contact info under     Signed, Dorris Carnes, MD  09/17/2022 3:16 PM

## 2022-09-17 NOTE — Telephone Encounter (Signed)
09/12/22 K 3.2-per Dr Harrington Challenger, increase KCl to two tablets daily.  I confirmed with patient she is currently taking one KCl 20 mEq tablet daily-she has been instructed to increase to two KCl 20 mEq tablets daily, will plan to recheck BMP on arrival to Short Stay 09/19/22.

## 2022-09-19 ENCOUNTER — Observation Stay (HOSPITAL_COMMUNITY)
Admission: RE | Admit: 2022-09-19 | Discharge: 2022-09-20 | Disposition: A | Discharge status: Home / Self Care | Payer: Medicare PPO | Attending: Internal Medicine | Admitting: Internal Medicine

## 2022-09-19 ENCOUNTER — Encounter (HOSPITAL_COMMUNITY): Payer: Self-pay | Admitting: Internal Medicine

## 2022-09-19 ENCOUNTER — Other Ambulatory Visit: Payer: Self-pay

## 2022-09-19 ENCOUNTER — Observation Stay (HOSPITAL_COMMUNITY): Payer: Medicare PPO

## 2022-09-19 ENCOUNTER — Encounter (HOSPITAL_COMMUNITY)
Admission: RE | Disposition: A | Discharge status: Home / Self Care | Payer: Self-pay | Source: Home / Self Care | Attending: Internal Medicine

## 2022-09-19 DIAGNOSIS — I110 Hypertensive heart disease with heart failure: Secondary | ICD-10-CM | POA: Diagnosis not present

## 2022-09-19 DIAGNOSIS — Z96653 Presence of artificial knee joint, bilateral: Secondary | ICD-10-CM | POA: Insufficient documentation

## 2022-09-19 DIAGNOSIS — I5020 Unspecified systolic (congestive) heart failure: Secondary | ICD-10-CM | POA: Diagnosis not present

## 2022-09-19 DIAGNOSIS — Z87891 Personal history of nicotine dependence: Secondary | ICD-10-CM | POA: Insufficient documentation

## 2022-09-19 DIAGNOSIS — W2209XA Striking against other stationary object, initial encounter: Secondary | ICD-10-CM | POA: Diagnosis not present

## 2022-09-19 DIAGNOSIS — N179 Acute kidney failure, unspecified: Principal | ICD-10-CM | POA: Insufficient documentation

## 2022-09-19 DIAGNOSIS — I5023 Acute on chronic systolic (congestive) heart failure: Secondary | ICD-10-CM | POA: Diagnosis not present

## 2022-09-19 DIAGNOSIS — R102 Pelvic and perineal pain: Secondary | ICD-10-CM | POA: Insufficient documentation

## 2022-09-19 DIAGNOSIS — F909 Attention-deficit hyperactivity disorder, unspecified type: Secondary | ICD-10-CM | POA: Insufficient documentation

## 2022-09-19 DIAGNOSIS — S7011XA Contusion of right thigh, initial encounter: Secondary | ICD-10-CM | POA: Insufficient documentation

## 2022-09-19 DIAGNOSIS — Z79899 Other long term (current) drug therapy: Secondary | ICD-10-CM | POA: Insufficient documentation

## 2022-09-19 DIAGNOSIS — R42 Dizziness and giddiness: Secondary | ICD-10-CM | POA: Diagnosis present

## 2022-09-19 DIAGNOSIS — R7989 Other specified abnormal findings of blood chemistry: Secondary | ICD-10-CM | POA: Insufficient documentation

## 2022-09-19 DIAGNOSIS — E876 Hypokalemia: Secondary | ICD-10-CM | POA: Insufficient documentation

## 2022-09-19 DIAGNOSIS — R944 Abnormal results of kidney function studies: Secondary | ICD-10-CM

## 2022-09-19 DIAGNOSIS — N289 Disorder of kidney and ureter, unspecified: Secondary | ICD-10-CM | POA: Diagnosis not present

## 2022-09-19 DIAGNOSIS — I4891 Unspecified atrial fibrillation: Secondary | ICD-10-CM

## 2022-09-19 DIAGNOSIS — I5022 Chronic systolic (congestive) heart failure: Secondary | ICD-10-CM | POA: Diagnosis not present

## 2022-09-19 DIAGNOSIS — Z7901 Long term (current) use of anticoagulants: Secondary | ICD-10-CM | POA: Diagnosis not present

## 2022-09-19 DIAGNOSIS — I2510 Atherosclerotic heart disease of native coronary artery without angina pectoris: Secondary | ICD-10-CM | POA: Insufficient documentation

## 2022-09-19 DIAGNOSIS — I080 Rheumatic disorders of both mitral and aortic valves: Secondary | ICD-10-CM | POA: Diagnosis not present

## 2022-09-19 DIAGNOSIS — Z01812 Encounter for preprocedural laboratory examination: Secondary | ICD-10-CM

## 2022-09-19 DIAGNOSIS — I480 Paroxysmal atrial fibrillation: Secondary | ICD-10-CM | POA: Insufficient documentation

## 2022-09-19 LAB — BASIC METABOLIC PANEL
Anion gap: 17 — ABNORMAL HIGH (ref 5–15)
Anion gap: 17 — ABNORMAL HIGH (ref 5–15)
Anion gap: 14 (ref 5–15)
BUN: 45 mg/dL — ABNORMAL HIGH (ref 8–23)
BUN: 46 mg/dL — ABNORMAL HIGH (ref 8–23)
BUN: 48 mg/dL — ABNORMAL HIGH (ref 8–23)
CO2: 26 mmol/L (ref 22–32)
CO2: 26 mmol/L (ref 22–32)
CO2: 26 mmol/L (ref 22–32)
Calcium: 8.8 mg/dL — ABNORMAL LOW (ref 8.9–10.3)
Calcium: 9.1 mg/dL (ref 8.9–10.3)
Calcium: 8.7 mg/dL — ABNORMAL LOW (ref 8.9–10.3)
Chloride: 87 mmol/L — ABNORMAL LOW (ref 98–111)
Chloride: 88 mmol/L — ABNORMAL LOW (ref 98–111)
Chloride: 91 mmol/L — ABNORMAL LOW (ref 98–111)
Creatinine, Ser: 3.38 mg/dL — ABNORMAL HIGH (ref 0.44–1.00)
Creatinine, Ser: 3.34 mg/dL — ABNORMAL HIGH (ref 0.44–1.00)
Creatinine, Ser: 3.12 mg/dL — ABNORMAL HIGH (ref 0.44–1.00)
GFR, Estimated: 14 mL/min — ABNORMAL LOW (ref >60)
GFR, Estimated: 14 mL/min — ABNORMAL LOW (ref >60)
GFR, Estimated: 15 mL/min — ABNORMAL LOW (ref >60)
Glucose, Bld: 116 mg/dL — ABNORMAL HIGH (ref 70–99)
Glucose, Bld: 93 mg/dL (ref 70–99)
Glucose, Bld: 81 mg/dL (ref 70–99)
Potassium: 3 mmol/L — ABNORMAL LOW (ref 3.5–5.1)
Potassium: 3.9 mmol/L (ref 3.5–5.1)
Potassium: 3.3 mmol/L — ABNORMAL LOW (ref 3.5–5.1)
Sodium: 130 mmol/L — ABNORMAL LOW (ref 135–145)
Sodium: 131 mmol/L — ABNORMAL LOW (ref 135–145)
Sodium: 131 mmol/L — ABNORMAL LOW (ref 135–145)

## 2022-09-19 SURGERY — RIGHT/LEFT HEART CATH AND CORONARY ANGIOGRAPHY
Anesthesia: LOCAL

## 2022-09-19 MED ORDER — FLUOXETINE HCL 20 MG PO CAPS
40.0000 mg | ORAL_CAPSULE | Freq: Every day | ORAL | Status: DC
  Administered 2022-09-20: 40 mg via ORAL
  Filled 2022-09-19: qty 2

## 2022-09-19 MED ORDER — LAMOTRIGINE 100 MG PO TABS
100.0000 mg | ORAL_TABLET | Freq: Every day | ORAL | Status: DC
  Administered 2022-09-20: 100 mg via ORAL
  Filled 2022-09-19: qty 1

## 2022-09-19 MED ORDER — ASPIRIN 81 MG PO TBEC
81.0000 mg | DELAYED_RELEASE_TABLET | Freq: Every day | ORAL | Status: DC
  Administered 2022-09-20: 81 mg via ORAL
  Filled 2022-09-19 (×2): qty 1

## 2022-09-19 MED ORDER — ONDANSETRON HCL 4 MG/2ML IJ SOLN: 4.0000 mg | Freq: Four times a day (QID) | INTRAMUSCULAR | Status: DC | PRN

## 2022-09-19 MED ORDER — ASPIRIN 300 MG RE SUPP
300.0000 mg | RECTAL | Status: AC
  Filled 2022-09-19: qty 1

## 2022-09-19 MED ORDER — SODIUM CHLORIDE 0.9% FLUSH
3.0000 mL | Freq: Two times a day (BID) | INTRAVENOUS | Status: DC
  Administered 2022-09-20: 3 mL via INTRAVENOUS

## 2022-09-19 MED ORDER — SODIUM CHLORIDE 0.9 % IV SOLN: INTRAVENOUS | Status: DC

## 2022-09-19 MED ORDER — HEPARIN BOLUS VIA INFUSION: 4000.0000 [IU] | Freq: Once | INTRAVENOUS | Status: DC

## 2022-09-19 MED ORDER — ASPIRIN 81 MG PO CHEW: 81.0000 mg | CHEWABLE_TABLET | ORAL | Status: DC

## 2022-09-19 MED ORDER — NITROGLYCERIN 0.4 MG SL SUBL: 0.4000 mg | SUBLINGUAL_TABLET | SUBLINGUAL | Status: DC | PRN

## 2022-09-19 MED ORDER — ALPRAZOLAM 0.25 MG PO TABS: 0.2500 mg | ORAL_TABLET | Freq: Every day | ORAL | Status: DC | PRN

## 2022-09-19 MED ORDER — ACETAMINOPHEN 325 MG PO TABS: 650.0000 mg | ORAL_TABLET | ORAL | Status: DC | PRN

## 2022-09-19 MED ORDER — SODIUM CHLORIDE 0.9% FLUSH: 3.0000 mL | INTRAVENOUS | Status: DC | PRN

## 2022-09-19 MED ORDER — ASPIRIN 81 MG PO CHEW
324.0000 mg | CHEWABLE_TABLET | ORAL | Status: AC
  Filled 2022-09-19: qty 4

## 2022-09-19 MED ORDER — ZOLPIDEM TARTRATE 5 MG PO TABS
5.0000 mg | ORAL_TABLET | Freq: Every evening | ORAL | Status: DC | PRN
  Administered 2022-09-19: 5 mg via ORAL
  Filled 2022-09-19: qty 1

## 2022-09-19 MED ORDER — SODIUM CHLORIDE 0.9 % IV SOLN: 250.0000 mL | INTRAVENOUS | Status: DC | PRN

## 2022-09-19 MED ORDER — HEPARIN (PORCINE) 25000 UT/250ML-% IV SOLN
900.0000 [IU]/h | INTRAVENOUS | Status: DC
  Filled 2022-09-19: qty 250

## 2022-09-19 MED ORDER — PROSIGHT PO TABS
1.0000 | ORAL_TABLET | Freq: Two times a day (BID) | ORAL | Status: DC
  Administered 2022-09-19 – 2022-09-20 (×2): 1 via ORAL
  Filled 2022-09-19 (×4): qty 1

## 2022-09-19 MED ORDER — SODIUM CHLORIDE 0.9 % IV SOLN: INTRAVENOUS | Status: AC

## 2022-09-19 NOTE — Progress Notes (Signed)
On skin assessment, RN notified large bruise to the R side of pts R thigh. Cardio phoned to confirm if hep drip should be start. Stated to hold off for now.

## 2022-09-19 NOTE — Progress Notes (Addendum)
ANTICOAGULATION CONSULT NOTE - Initial Consult  Pharmacy Consult for Heparin Indication: chest pain/ACS and atrial fibrillation  Allergies  Allergen Reactions   Amoxicillin     Hives   Sulfamethoxazole Nausea And Vomiting    See patient list of med intolerances due to h/o gastric bypass and nephrectomy    Patient Measurements: Height: '5\' 4"'$  (162.6 cm) Weight: 83.5 kg (184 lb) IBW/kg (Calculated) : 54.7 Heparin Dosing Weight: 73 kg  Vital Signs: Temp: 97.8 F (36.6 C) (02/29 0841) Temp Source: Temporal (02/29 0841) BP: 124/69 (02/29 0841) Pulse Rate: 71 (02/29 0841)  Labs: Recent Labs    09/19/22 0832 09/19/22 1118  CREATININE 3.38* 3.34*    Estimated Creatinine Clearance: 15.9 mL/min (A) (by C-G formula based on SCr of 3.34 mg/dL (H)).   Medical History: Past Medical History:  Diagnosis Date   ADD (attention deficit disorder)    Allergy    Anemia    Arthritis 07/22/2014   osteoarthritis left knee   Colon polyps    Hypertension    Left knee pain    chronic   Narcotic addiction ([place])    Obesity    post gastric bypass   Vitamin D deficiency    Assessment: 72 y.o. female with medical history significant for atrial fibrillation on apixaban PTA who is being evaluated for R/L heart catheterization on 2/29. LD apixaban 2/26 @ 10:00. Pharmacy consulted to start heparin.  CBC from 2/22 pre-cath wnl, platelets 318. Heparin to start for Afib/ACS. Patient observed to have large hematoma on leg now with concerns for possible fracture following fall earlier this week. Per d/w Eulas Post with Cardiology, CT ordered to rule out fracture/injury and will start heparin this evening if no issues noted.  Goal of Therapy:  Heparin level 0.3-0.7 units/ml aPTT 66-102 seconds Monitor platelets by anticoagulation protocol: Yes   Plan:  F/u starting heparin infusion after CT Check heparin level in 8 hours and daily while on heparin Continue to monitor H&H and platelets  Thank you  for allowing pharmacy to be a part of this patient's care.  Ardyth Harps, PharmD Clinical Pharmacist

## 2022-09-19 NOTE — Progress Notes (Signed)
Report given to [facility] RN. Once room is clean, will have patient transported up.

## 2022-09-20 ENCOUNTER — Encounter (HOSPITAL_COMMUNITY): Payer: Self-pay | Admitting: Internal Medicine

## 2022-09-20 ENCOUNTER — Other Ambulatory Visit (HOSPITAL_COMMUNITY): Payer: Self-pay

## 2022-09-20 ENCOUNTER — Other Ambulatory Visit: Payer: Self-pay | Admitting: Cardiology

## 2022-09-20 DIAGNOSIS — T148XXA Other injury of unspecified body region, initial encounter: Secondary | ICD-10-CM

## 2022-09-20 DIAGNOSIS — I5020 Unspecified systolic (congestive) heart failure: Secondary | ICD-10-CM | POA: Diagnosis not present

## 2022-09-20 DIAGNOSIS — E876 Hypokalemia: Secondary | ICD-10-CM | POA: Diagnosis not present

## 2022-09-20 DIAGNOSIS — N179 Acute kidney failure, unspecified: Secondary | ICD-10-CM | POA: Diagnosis not present

## 2022-09-20 DIAGNOSIS — Z96653 Presence of artificial knee joint, bilateral: Secondary | ICD-10-CM | POA: Diagnosis not present

## 2022-09-20 DIAGNOSIS — I5022 Chronic systolic (congestive) heart failure: Secondary | ICD-10-CM

## 2022-09-20 DIAGNOSIS — R7989 Other specified abnormal findings of blood chemistry: Secondary | ICD-10-CM | POA: Diagnosis not present

## 2022-09-20 DIAGNOSIS — S7011XA Contusion of right thigh, initial encounter: Secondary | ICD-10-CM | POA: Diagnosis not present

## 2022-09-20 DIAGNOSIS — Z87891 Personal history of nicotine dependence: Secondary | ICD-10-CM | POA: Diagnosis not present

## 2022-09-20 DIAGNOSIS — Z7901 Long term (current) use of anticoagulants: Secondary | ICD-10-CM | POA: Diagnosis not present

## 2022-09-20 DIAGNOSIS — Z79899 Other long term (current) drug therapy: Secondary | ICD-10-CM | POA: Diagnosis not present

## 2022-09-20 DIAGNOSIS — I480 Paroxysmal atrial fibrillation: Secondary | ICD-10-CM | POA: Diagnosis not present

## 2022-09-20 DIAGNOSIS — I110 Hypertensive heart disease with heart failure: Secondary | ICD-10-CM | POA: Diagnosis not present

## 2022-09-20 DIAGNOSIS — N289 Disorder of kidney and ureter, unspecified: Secondary | ICD-10-CM

## 2022-09-20 DIAGNOSIS — I080 Rheumatic disorders of both mitral and aortic valves: Secondary | ICD-10-CM | POA: Diagnosis not present

## 2022-09-20 DIAGNOSIS — I4891 Unspecified atrial fibrillation: Secondary | ICD-10-CM | POA: Diagnosis not present

## 2022-09-20 DIAGNOSIS — R944 Abnormal results of kidney function studies: Secondary | ICD-10-CM | POA: Diagnosis not present

## 2022-09-20 DIAGNOSIS — I2510 Atherosclerotic heart disease of native coronary artery without angina pectoris: Secondary | ICD-10-CM | POA: Diagnosis not present

## 2022-09-20 LAB — BASIC METABOLIC PANEL
Anion gap: 13 (ref 5–15)
Anion gap: 14 (ref 5–15)
BUN: 47 mg/dL — ABNORMAL HIGH (ref 8–23)
BUN: 48 mg/dL — ABNORMAL HIGH (ref 8–23)
CO2: 25 mmol/L (ref 22–32)
CO2: 22 mmol/L (ref 22–32)
Calcium: 8.6 mg/dL — ABNORMAL LOW (ref 8.9–10.3)
Calcium: 9 mg/dL (ref 8.9–10.3)
Chloride: 87 mmol/L — ABNORMAL LOW (ref 98–111)
Chloride: 92 mmol/L — ABNORMAL LOW (ref 98–111)
Creatinine, Ser: 2.86 mg/dL — ABNORMAL HIGH (ref 0.44–1.00)
Creatinine, Ser: 2.38 mg/dL — ABNORMAL HIGH (ref 0.44–1.00)
GFR, Estimated: 17 mL/min — ABNORMAL LOW (ref >60)
GFR, Estimated: 21 mL/min — ABNORMAL LOW (ref >60)
Glucose, Bld: 107 mg/dL — ABNORMAL HIGH (ref 70–99)
Glucose, Bld: 93 mg/dL (ref 70–99)
Potassium: 2.8 mmol/L — ABNORMAL LOW (ref 3.5–5.1)
Potassium: 3.3 mmol/L — ABNORMAL LOW (ref 3.5–5.1)
Sodium: 125 mmol/L — ABNORMAL LOW (ref 135–145)
Sodium: 128 mmol/L — ABNORMAL LOW (ref 135–145)

## 2022-09-20 LAB — CBC
HCT: 29.9 % — ABNORMAL LOW (ref 36.0–46.0)
Hemoglobin: 10.2 g/dL — ABNORMAL LOW (ref 12.0–15.0)
MCH: 34 pg (ref 26.0–34.0)
MCHC: 34.1 g/dL (ref 30.0–36.0)
MCV: 99.7 fL (ref 80.0–100.0)
Platelets: 274 10*3/uL (ref 150–400)
RBC: 3 MIL/uL — ABNORMAL LOW (ref 3.87–5.11)
RDW: 12.8 % (ref 11.5–15.5)
WBC: 7.2 10*3/uL (ref 4.0–10.5)
nRBC: 0 % (ref 0.0–0.2)

## 2022-09-20 LAB — BRAIN NATRIURETIC PEPTIDE: B Natriuretic Peptide: 170.8 pg/mL — ABNORMAL HIGH (ref 0.0–100.0)

## 2022-09-20 MED ORDER — ZOLPIDEM TARTRATE 5 MG PO TABS
5.0000 mg | ORAL_TABLET | Freq: Every evening | ORAL | Status: DC | PRN
  Filled 2022-09-20: qty 1

## 2022-09-20 MED ORDER — POTASSIUM CHLORIDE CRYS ER 20 MEQ PO TBCR
40.0000 meq | EXTENDED_RELEASE_TABLET | Freq: Once | ORAL | Status: AC
  Administered 2022-09-20: 40 meq via ORAL
  Filled 2022-09-20: qty 2

## 2022-09-20 MED ORDER — POTASSIUM CHLORIDE CRYS ER 20 MEQ PO TBCR: 20.0000 meq | EXTENDED_RELEASE_TABLET | Freq: Two times a day (BID) | ORAL | Status: DC

## 2022-09-20 MED ORDER — POTASSIUM CHLORIDE 20 MEQ PO PACK
40.0000 meq | PACK | Freq: Once | ORAL | Status: AC
  Administered 2022-09-20: 40 meq via ORAL
  Filled 2022-09-20: qty 2

## 2022-09-20 MED ORDER — ZOLPIDEM TARTRATE 5 MG PO TABS
5.0000 mg | ORAL_TABLET | Freq: Once | ORAL | Status: AC
  Administered 2022-09-20: 5 mg via ORAL
  Filled 2022-09-20: qty 1

## 2022-09-20 NOTE — Progress Notes (Signed)
Rounding Note    Patient Name: Leslie [name] Date of Encounter: 09/20/2022   Subjective   Breathing is OK  NO CP  (never had)  Inpatient Medications    Scheduled Meds:  aspirin  324 mg Oral NOW   Or   aspirin  300 mg Rectal NOW   aspirin EC  81 mg Oral Daily   FLUoxetine  40 mg Oral Daily   lamoTRIgine  100 mg Oral Daily   multivitamin  1 tablet Oral BID   sodium chloride flush  3 mL Intravenous Q12H   Continuous Infusions:  PRN Meds: acetaminophen, ALPRAZolam, nitroGLYCERIN, ondansetron (ZOFRAN) IV, zolpidem   Vital Signs    Vitals:   09/19/22 0841 09/19/22 1545 09/19/22 1927 09/20/22 0118  BP: 124/69 112/79 (!) 103/50 118/63  Pulse: 71 69 73 78  Resp: '18 18 18 18  '$ Temp: 97.8 F (36.6 C) (!) 97.4 F (36.3 C) 97.6 F (36.4 C) 97.6 F (36.4 C)  TempSrc: Temporal Oral Oral Oral  SpO2: 96% 92% 100% 96%  Weight: 83.5 kg 81.7 kg  87.1 kg  Height: '5\' 4"'$  (1.626 m) '5\' 4"'$  (1.626 m)      Intake/Output Summary (Last 24 hours) at 09/20/2022 0654 Last data filed at 09/19/2022 2200 Gross per 24 hour  Intake 619.68 ml  Output 200 ml  Net 419.68 ml      09/20/2022    1:18 AM 09/19/2022    3:45 PM 09/19/2022    8:41 AM  Last 3 Weights  Weight (lbs) 192 lb 0.3 oz 180 lb 1.9 oz 184 lb  Weight (kg) 87.1 kg 81.7 kg 83.462 kg      Telemetry    SR  - Personally Reviewed  ECG    No new  - Personally Reviewed  Physical Exam  \ GEN: No acute distress.   Neck: No JVD Cardiac: RRR, no murmurs, Respiratory: Clear to auscultation  GI: Soft, nontender, non-distended  MS: No edema;   Bruising R thigh   Neuro:  Nonfocal  Psych: Normal affect   Labs    High Sensitivity Troponin:  No results for input(s): "TROPONINIHS" in the last 720 hours.   Chemistry Recent Labs  Lab 09/19/22 1118 09/19/22 1632 09/20/22 0145  NA 131* 131* 125*  K 3.9 3.3* 2.8*  CL 88* 91* 87*  CO2 '26 26 25  '$ GLUCOSE 93 81 107*  BUN 46* 48* 47*  CREATININE 3.34* 3.12* 2.86*  CALCIUM 9.1  8.7* 8.6*  GFRNONAA 14* 15* 17*  ANIONGAP 17* 14 13    Lipids No results for input(s): "CHOL", "TRIG", "HDL", "LABVLDL", "LDLCALC", "CHOLHDL" in the last 168 hours.  Hematology Recent Labs  Lab 09/20/22 0145  WBC 7.2  RBC 3.00*  HGB 10.2*  HCT 29.9*  MCV 99.7  MCH 34.0  MCHC 34.1  RDW 12.8  PLT 274   Thyroid No results for input(s): "TSH", "FREET4" in the last 168 hours.  BNP Recent Labs  Lab 09/20/22 0145  BNP 170.8*    DDimer No results for input(s): "DDIMER" in the last 168 hours.   Radiology    CT PELVIS WO CONTRAST  Result Date: 09/19/2022 CLINICAL DATA:  Pelvic pain, fall, right hip bruising EXAM: CT PELVIS WITHOUT CONTRAST TECHNIQUE: Multidetector CT imaging of the pelvis was performed following the standard protocol without intravenous contrast. RADIATION DOSE REDUCTION: This exam was performed according to the departmental dose-optimization program which includes automated exposure control, adjustment of the mA and/or kV according to  patient size and/or use of iterative reconstruction technique. COMPARISON:  None Available. FINDINGS: Urinary Tract:  No abnormality visualized. Bowel: Small bowel anastomotic staple line noted within the left mid abdomen. Visualized large and small bowel are otherwise unremarkable. Appendix normal. No free intraperitoneal fluid. Vascular/Lymphatic: Moderate aortoiliac atherosclerotic calcification. No aortic aneurysm. No pathologic adenopathy within the abdomen and pelvis. Reproductive:  No mass or other significant abnormality Other: Subcutaneous infiltration within the right thigh lateral to the greater trochanter and 23 x 48 mm hyperdense ellipsoid subcutaneous collection in keeping with a subcutaneous hematoma noted in keeping with given history of local trauma. Musculoskeletal: Normal alignment. No acute fracture or dislocation. Degenerative changes are seen within the lumbar spine. No lytic or blastic bone lesion. IMPRESSION: 1. No acute  fracture or dislocation. 2. 48 mm subcutaneous hematoma within the right thigh lateral to the greater trochanter. Electronically Signed   By: Fidela Salisbury M.D.   On: 09/19/2022 18:41      Patient Profile     72 y.o. female   Assessment & Plan    1  Renal  Cr improving with IV hydration  Now 2.38     BNP normal (was in  Pt with 1 kidney     I am not sure why pt had decompensation.    She had been on same dose of diuretic for several weeks  Now appears intravascularly  dry    ? Confusion of meds   ? Fall leading to change in hemodynamics   Pt did bleed into thigh     REcomm Hold diuretic    Get BMET and BNP  Monday    Reassess from there   2  HFrEF  THis is a new Dx  LVEF 25 t o30%  RVEF normal    Pt without CP Had planned L heart cath to r/o CAD  May plan another approach   Possible PET CT   3   PAF  Documented on event monitor     Had been on ELiquis  Has been in SR     Hold until Sunday and resume   Check CBC on Monday      OK to d/c home    Low salt diet      WIll arrange for follow up based on labs  For questions or updates, please contact [place] Please consult [email or web address] for contact info under        Signed, Dorris Carnes, MD  09/20/2022, 6:54 AM

## 2022-09-20 NOTE — Discharge Summary (Signed)
Discharge Summary    Patient ID: Leslie [name] MRN: [id number]; DOB: 1950-11-17  Admit date: 09/19/2022 Discharge date: 09/20/2022  PCP:  Isaac Bliss, Rayford Halsted, MD   [place] Providers Cardiologist:  None        Discharge Diagnoses    Principal Problem:   Renal insufficiency    Diagnostic Studies/Procedures    No studies this admission  08/29/22 TTE IMPRESSIONS     1. Left ventricular ejection fraction, by estimation, is 25 to 30%. The  left ventricle has severely decreased function. The left ventricle  demonstrates global hypokinesis. The left ventricular internal cavity size  was moderately dilated. Left  ventricular diastolic parameters were normal.   2. Right ventricular systolic function is normal. The right ventricular  size is normal.   3. Left atrial size was moderately dilated.   4. The mitral valve is abnormal. Mild mitral valve regurgitation. No  evidence of mitral stenosis.   5. The aortic valve is normal in structure. There is mild calcification  of the aortic valve. There is mild thickening of the aortic valve. Aortic  valve regurgitation is mild to moderate. Aortic valve sclerosis is  present, with no evidence of aortic  valve stenosis.   6. The inferior vena cava is normal in size with greater than 50%  respiratory variability, suggesting right atrial pressure of 3 mmHg.   FINDINGS   Left Ventricle: Left ventricular ejection fraction, by estimation, is 25  to 30%. The left ventricle has severely decreased function. The left  ventricle demonstrates global hypokinesis. The left ventricular internal  cavity size was moderately dilated.  There is no left ventricular hypertrophy. Left ventricular diastolic  parameters were normal.   Right Ventricle: The right ventricular size is normal. No increase in  right ventricular wall thickness. Right ventricular systolic function is  normal.   Left Atrium: Left atrial size was  moderately dilated.   Right Atrium: Right atrial size was normal in size.   Pericardium: Trivial pericardial effusion is present. The pericardial  effusion is posterior to the left ventricle.   Mitral Valve: The mitral valve is abnormal. There is mild thickening of  the mitral valve leaflet(s). Mild mitral annular calcification. Mild  mitral valve regurgitation. No evidence of mitral valve stenosis.   Tricuspid Valve: The tricuspid valve is normal in structure. Tricuspid  valve regurgitation is mild . No evidence of tricuspid stenosis.   Aortic Valve: The aortic valve is normal in structure. There is mild  calcification of the aortic valve. There is mild thickening of the aortic  valve. Aortic valve regurgitation is mild to moderate. Aortic  regurgitation PHT measures 474 msec. Aortic valve   sclerosis is present, with no evidence of aortic valve stenosis.   Pulmonic Valve: The pulmonic valve was normal in structure. Pulmonic valve  regurgitation is mild. No evidence of pulmonic stenosis.   Aorta: The aortic root is normal in size and structure.   Venous: The inferior vena cava is normal in size with greater than 50%  respiratory variability, suggesting right atrial pressure of 3 mmHg.   IAS/Shunts: No atrial level shunt detected by color flow Doppler.    08/30/22 Heart Monitor  Patient had a min HR of 66 bpm, max HR of 213 bpm, and avg HR of 116 bpm. Predominant underlying rhythm was Sinus Rhythm. Bundle Branch Block/IVCD was present. 18 Ventricular Tachycardia runs occurred, the run with the fastest interval lasting 4 beats  with a max rate of  187 bpm, the longest lasting 5 beats with an avg rate of 130 bpm. 14 Supraventricular Tachycardia runs occurred, the run with the fastest interval lasting 5 beats with a max rate of 203 bpm, the longest lasting 20.2 secs with an avg  rate of 160 bpm. Atrial Fibrillation occurred (36% burden), ranging from 98-213 bpm (avg of 147 bpm), the  longest lasting 2 days 10 hours with an avg rate of 147 bpm. Atrial Fibrillation was detected within +/- 45 seconds of symptomatic patient event(s).  Isolated SVEs were occasional (3.4%, 39491), SVE Couplets were rare (<1.0%, 1847), and SVE Triplets were rare (<1.0%, 421). Isolated VEs were frequent (7.8%, 90053), VE Triplets were rare (<1.0%, 236), and no VE Couplets were present. Ventricular  Bigeminy and Trigeminy were present. _________   History of Present Illness     Leslie [name] is a 72 y.o. female with history of ADD, anemia, OA of knee, hypertension, coronary calcifications on CT, single kidney s/p donation in 2004. Patient was seen by Dr. Harrington Challenger on 1/19 in clinic following ED visit 2/2 shortness of breath/pulmonary edema on 1/16. Patient was treated with IV lasix and had notable clinical improvement. On 1/19 with Dr. Harrington Challenger, patient noted chest pain x2 weeks. She was noted to be in multi-focal atrial tachycardia with mild elevation of JVP. Given symptoms concerning for heart failure, an echocardiogram was recommended as well as 7 day heart monitor. TTE noted LVEF 25-30%, global hypokinesis and Dr. Harrington Challenger recommended right and left heart cath to further assess coronary anatomy and pressures. The day after the echocardiogram, 7 day heart monitor showed 36% atrial fibrillation burden and patient was started on Eliquis '5mg'$ . Lasix '40mg'$  QD also started due to elevated BNP, subsequently held around 2/9 (see lab value for documentation from Dr. Harrington Challenger) due to hypokalemia, then resumed. On 2/22, K supplementation increased to 40mq with persistently low K.   LHC/RHC scheduled for 2/29 but on day of procedure, patient was found to have K of 3.0, creatinine elevated to 3.38. Decision made to admit for management of AKI.   Hospital Course     Consultants: n/a   AKI  Patient admitted from short stay on day of scheduled outpatient LHC/RHC due to hypokalemia/AKI. Source of AKI unclear though possibility  that patient was over-diuresed. She received IV fluids through 2/29 into 3/1 and on day of discharge, creatinine improved to 2.38. Repeat BMP on Monday 3/4.  Hypokalemia  Patient with potassium down to as low as 2.8 this admission. Has received replacement and last BMP prior to discharge with K 3.3. Will give patient another 482m prior to discharge and plan for daily 401mreplacement until otherwise advised. Repeat BMP Monday 3/4.  HFrEF  Patient with newly noted reduced LVEF 25-30% with global hypokinesis. No prior cardiac history. No chest pain. Planned RHC/LHC on 2/29 but unable to complete due to significant hypokalemia and AKI. Per Dr. RosHarrington Challengerurther workup to be determined outpatient, possible cardiac PET. Per Dr. RosHarrington Challengeriven AKI/low normal BP, will hold GDMT including lasix until patient can have repeat labs and be seen for follow up in clinic. Appears euvolemic at time of discharge, repeat BNP on Monday 3/4.  Paroxysmal atrial fibrillation Right thigh hematoma  Patient found with 36% afib burden on recent 7 day monitor. Currently in NSR without rate/rhythm controlling medication. Patient found with bruising of right thigh/hip this admission. Says that she did not fall but hit her hip against a heavy screen door. Ecchymosis noted but no  substantial hematoma on physical exam. Per Dr. Harrington Challenger, will hold Eliquis until Sunday 3/3. Repeat CBC on Monday 3/4. Patient encouraged to use caution with ambulation to avoid falls/injury.  Attention deficit disorder  Patient has chronically taken stimulant regimen. Given new heart failure and afib, strongly encouraged patient to discontinue and discuss non-stimulant therapies with PCP.  Patient seen by Dr. Harrington Challenger on day of discharge and deemed stable and appropriate for discharge.      Did the patient have an acute coronary syndrome (MI, NSTEMI, STEMI, etc) this admission?:  No                               Did the patient have a percutaneous coronary  intervention (stent / angioplasty)?:  No.   _____________  Discharge Vitals Blood pressure 110/65, pulse 71, temperature 97.6 F (36.4 C), temperature source Oral, resp. rate 18, height '5\' 4"'$  (1.626 m), weight 87.1 kg, SpO2 100 %.  Filed Weights   09/19/22 0841 09/19/22 1545 09/20/22 0118  Weight: 83.5 kg 81.7 kg 87.1 kg   Physical Exam Vitals reviewed.  Constitutional:      Appearance: Normal appearance.  HENT:     Head: Normocephalic.     Nose: Nose normal.  Eyes:     Pupils: Pupils are equal, round, and reactive to light.  Cardiovascular:     Rate and Rhythm: Normal rate and regular rhythm.     Heart sounds: No murmur heard. Pulmonary:     Effort: Pulmonary effort is normal. No respiratory distress.     Breath sounds: No wheezing or rales.  Abdominal:     General: Abdomen is flat. Bowel sounds are normal.     Palpations: Abdomen is soft.  Musculoskeletal:        General: Normal range of motion.     Cervical back: Normal range of motion.  Skin:    General: Skin is warm and dry.     Capillary Refill: Capillary refill takes less than 2 seconds.     Comments: Bruising noted right hip. Non-tender.  Neurological:     General: No focal deficit present.     Mental Status: She is alert and oriented to person, place, and time.      Labs & Radiologic Studies    CBC Recent Labs    09/20/22 0145  WBC 7.2  HGB 10.2*  HCT 29.9*  MCV 99.7  PLT 123456   Basic Metabolic Panel Recent Labs    09/20/22 0145 09/20/22 0718  NA 125* 128*  K 2.8* 3.3*  CL 87* 92*  CO2 25 22  GLUCOSE 107* 93  BUN 47* 48*  CREATININE 2.86* 2.38*  CALCIUM 8.6* 9.0   Liver Function Tests No results for input(s): "AST", "ALT", "ALKPHOS", "BILITOT", "PROT", "ALBUMIN" in the last 72 hours. No results for input(s): "LIPASE", "AMYLASE" in the last 72 hours. High Sensitivity Troponin:   No results for input(s): "TROPONINIHS" in the last 720 hours.  BNP Invalid input(s): "POCBNP" D-Dimer No  results for input(s): "DDIMER" in the last 72 hours. Hemoglobin A1C No results for input(s): "HGBA1C" in the last 72 hours. Fasting Lipid Panel No results for input(s): "CHOL", "HDL", "LDLCALC", "TRIG", "CHOLHDL", "LDLDIRECT" in the last 72 hours. Thyroid Function Tests No results for input(s): "TSH", "T4TOTAL", "T3FREE", "THYROIDAB" in the last 72 hours.  Invalid input(s): "FREET3" _____________  CT PELVIS WO CONTRAST  Result Date: 09/19/2022 CLINICAL DATA:  Pelvic pain, fall,  right hip bruising EXAM: CT PELVIS WITHOUT CONTRAST TECHNIQUE: Multidetector CT imaging of the pelvis was performed following the standard protocol without intravenous contrast. RADIATION DOSE REDUCTION: This exam was performed according to the departmental dose-optimization program which includes automated exposure control, adjustment of the mA and/or kV according to patient size and/or use of iterative reconstruction technique. COMPARISON:  None Available. FINDINGS: Urinary Tract:  No abnormality visualized. Bowel: Small bowel anastomotic staple line noted within the left mid abdomen. Visualized large and small bowel are otherwise unremarkable. Appendix normal. No free intraperitoneal fluid. Vascular/Lymphatic: Moderate aortoiliac atherosclerotic calcification. No aortic aneurysm. No pathologic adenopathy within the abdomen and pelvis. Reproductive:  No mass or other significant abnormality Other: Subcutaneous infiltration within the right thigh lateral to the greater trochanter and 23 x 48 mm hyperdense ellipsoid subcutaneous collection in keeping with a subcutaneous hematoma noted in keeping with given history of local trauma. Musculoskeletal: Normal alignment. No acute fracture or dislocation. Degenerative changes are seen within the lumbar spine. No lytic or blastic bone lesion. IMPRESSION: 1. No acute fracture or dislocation. 2. 48 mm subcutaneous hematoma within the right thigh lateral to the greater trochanter.  Electronically Signed   By: Fidela Salisbury M.D.   On: 09/19/2022 18:41   LONG TERM MONITOR (3-14 DAYS)  Result Date: 09/09/2022 Patch Wear Time:  7 days and 3 hours (2024-01-25T12:08:04-499 to 2024-02-01T15:34:25-0500) Sinus rhythm  Rates 79 to 124 bpm   Average HR 94 bpm Atrial fibrillation   6 episodes   (36% of time)  Longest episode lasted 2 days 10 hours    Rates 66 to 213 bpm) Frequent PVCs (7.8%)   Occasional PACs   Short bursts of SVT   Fastest for 5 beats at 203 bpm, longest 20 sec.  ___________________________________________________________________________ ______________________________________________________ Patient had a min HR of 66 bpm, max HR of 213 bpm, and avg HR of 116 bpm. Predominant underlying rhythm was Sinus Rhythm. Bundle Branch Block/IVCD was present. 18 Ventricular Tachycardia runs occurred, the run with the fastest interval lasting 4 beats with a max rate of 187 bpm, the longest lasting 5 beats with an avg rate of 130 bpm. 14 Supraventricular Tachycardia runs occurred, the run with the fastest interval lasting 5 beats with a max rate of 203 bpm, the longest lasting 20.2 secs with an avg rate of 160 bpm. Atrial Fibrillation occurred (36% burden), ranging from 98-213 bpm (avg of 147 bpm), the longest lasting 2 days 10 hours with an avg rate of 147 bpm. Atrial Fibrillation was detected within +/- 45 seconds of symptomatic patient event(s).  Isolated SVEs were occasional (3.4%, 39491), SVE Couplets were rare (<1.0%, 1847), and SVE Triplets were rare (<1.0%, 421). Isolated VEs were frequent (7.8%, 90053), VE Triplets were rare (<1.0%, 236), and no VE Couplets were present. Ventricular Bigeminy and Trigeminy were present.   ECHOCARDIOGRAM COMPLETE  Result Date: 08/29/2022    ECHOCARDIOGRAM REPORT   Patient Name:   Leslie [name] [facility] Date of Exam: 08/29/2022 Medical Rec #:  [id number]         Height:       64.0 in Accession #:    [id number]        Weight:       191.6 lb Date of Birth:   [birth year]        BSA:          1.921 m Patient Age:    42 years          BP:  127/81 mmHg Patient Gender: F                 HR:           85 bpm. Exam Location:  [address] Procedure: 2D Echo, Cardiac Doppler and Color Doppler Indications:    I50.31 CHF  History:        Patient has no prior history of Echocardiogram examinations.                 CHF, Abnormal ECG, Arrythmias:Atrial Fibrillation; Risk                 Factors:Hypertension and Obesity.  Sonographer:    Coralyn Helling RDCS Referring Phys: [id number] PAULA V ROSS  Sonographer Comments: Technically difficult study due to poor echo windows. IMPRESSIONS  1. Left ventricular ejection fraction, by estimation, is 25 to 30%. The left ventricle has severely decreased function. The left ventricle demonstrates global hypokinesis. The left ventricular internal cavity size was moderately dilated. Left ventricular diastolic parameters were normal.  2. Right ventricular systolic function is normal. The right ventricular size is normal.  3. Left atrial size was moderately dilated.  4. The mitral valve is abnormal. Mild mitral valve regurgitation. No evidence of mitral stenosis.  5. The aortic valve is normal in structure. There is mild calcification of the aortic valve. There is mild thickening of the aortic valve. Aortic valve regurgitation is mild to moderate. Aortic valve sclerosis is present, with no evidence of aortic valve stenosis.  6. The inferior vena cava is normal in size with greater than 50% respiratory variability, suggesting right atrial pressure of 3 mmHg. FINDINGS  Left Ventricle: Left ventricular ejection fraction, by estimation, is 25 to 30%. The left ventricle has severely decreased function. The left ventricle demonstrates global hypokinesis. The left ventricular internal cavity size was moderately dilated. There is no left ventricular hypertrophy. Left ventricular diastolic parameters were normal. Right Ventricle: The right ventricular  size is normal. No increase in right ventricular wall thickness. Right ventricular systolic function is normal. Left Atrium: Left atrial size was moderately dilated. Right Atrium: Right atrial size was normal in size. Pericardium: Trivial pericardial effusion is present. The pericardial effusion is posterior to the left ventricle. Mitral Valve: The mitral valve is abnormal. There is mild thickening of the mitral valve leaflet(s). Mild mitral annular calcification. Mild mitral valve regurgitation. No evidence of mitral valve stenosis. Tricuspid Valve: The tricuspid valve is normal in structure. Tricuspid valve regurgitation is mild . No evidence of tricuspid stenosis. Aortic Valve: The aortic valve is normal in structure. There is mild calcification of the aortic valve. There is mild thickening of the aortic valve. Aortic valve regurgitation is mild to moderate. Aortic regurgitation PHT measures 474 msec. Aortic valve  sclerosis is present, with no evidence of aortic valve stenosis. Pulmonic Valve: The pulmonic valve was normal in structure. Pulmonic valve regurgitation is mild. No evidence of pulmonic stenosis. Aorta: The aortic root is normal in size and structure. Venous: The inferior vena cava is normal in size with greater than 50% respiratory variability, suggesting right atrial pressure of 3 mmHg. IAS/Shunts: No atrial level shunt detected by color flow Doppler.  LEFT VENTRICLE PLAX 2D LVIDd:         5.65 cm   Diastology LVIDs:         4.95 cm   LV e' medial:    6.92 cm/s LV PW:         1.00 cm  LV E/e' medial:  16.4 LV IVS:        1.00 cm   LV e' lateral:   9.53 cm/s LVOT diam:     2.40 cm   LV E/e' lateral: 11.9 LV SV:         46 LV SV Index:   24 LVOT Area:     4.52 cm  RIGHT VENTRICLE             IVC RV S prime:     14.60 cm/s  IVC diam: 1.00 cm TAPSE (M-mode): 1.1 cm RVSP:           22.7 mmHg LEFT ATRIUM             Index        RIGHT ATRIUM           Index LA diam:        4.30 cm 2.24 cm/m   RA  Pressure: 3.00 mmHg LA Vol (A2C):   72.4 ml 37.69 ml/m  RA Area:     11.00 cm LA Vol (A4C):   83.6 ml 43.52 ml/m  RA Volume:   28.10 ml  14.63 ml/m LA Biplane Vol: 84.3 ml 43.88 ml/m  AORTIC VALVE LVOT Vmax:   58.62 cm/s LVOT Vmean:  40.660 cm/s LVOT VTI:    0.103 m AI PHT:      474 msec  AORTA Ao Root diam: 3.40 cm Ao Asc diam:  3.50 cm MITRAL VALVE                TRICUSPID VALVE MV Area (PHT): 4.64 cm     TR Peak grad:   19.7 mmHg MV Decel Time: 163 msec     TR Vmax:        222.00 cm/s MV E velocity: 113.40 cm/s  Estimated RAP:  3.00 mmHg MV A velocity: 85.82 cm/s   RVSP:           22.7 mmHg MV E/A ratio:  1.32                             SHUNTS                             Systemic VTI:  0.10 m                             Systemic Diam: 2.40 cm Jenkins Rouge MD Electronically signed by Jenkins Rouge MD Signature Date/Time: 08/29/2022/4:27:02 PM    Final    Disposition   Pt is being discharged home today in good condition.  Follow-up Plans & Appointments     Follow-up Information     [facility]. Go in 24 day(s).   Specialty: Cardiology Why: Hospital follow up 10/14/2022 @ 10 am PLEASE bring a current medication list to appointment FREE valet parking, Entrance C, off [employer] information: [address] [id number] [place] [phone]               Discharge Instructions     Diet - low sodium heart healthy   Complete by: As directed    Discharge instructions   Complete by: As directed    Please do not take Lasix or HCTZ until advised by cardiology. You have important follow up labs  scheduled 3/4. Call the cardiology office with worsening shortness of breath or edema. If you have chest pain or severe shortness of breath, present to the ED or call 911.   Increase activity slowly   Complete by: As directed         Discharge Medications   Allergies as of 09/20/2022       Reactions    Amoxicillin    Hives   Sulfamethoxazole Nausea And Vomiting   See patient list of med intolerances due to h/o gastric bypass and nephrectomy        Medication List     STOP taking these medications    amphetamine-dextroamphetamine 20 MG tablet Commonly known as: Adderall   amphetamine-dextroamphetamine 30 MG 24 hr capsule Commonly known as: ADDERALL XR   furosemide 40 MG tablet Commonly known as: LASIX   hydrochlorothiazide 25 MG tablet Commonly known as: HYDRODIURIL   pseudoephedrine 30 MG tablet Commonly known as: SUDAFED       TAKE these medications    ALPRAZolam 0.25 MG tablet Commonly known as: XANAX TAKE 1 TABLET(0.25 MG) BY MOUTH AT BEDTIME AS NEEDED FOR ANXIETY What changed: See the new instructions.   apixaban 5 MG Tabs tablet Commonly known as: ELIQUIS Take 1 tablet (5 mg total) by mouth 2 (two) times daily.   FLUoxetine 40 MG capsule Commonly known as: PROZAC TAKE 1 CAPSULE(40 MG) BY MOUTH EVERY MORNING   gabapentin 100 MG capsule Commonly known as: NEURONTIN TAKE 1 CAPSULE(100 MG) BY MOUTH THREE TIMES DAILY   lamoTRIgine 100 MG tablet Commonly known as: LAMICTAL TAKE 1 TABLET(100 MG) BY MOUTH TWICE DAILY   potassium chloride SA 20 MEQ tablet Commonly known as: KLOR-CON M Take 1 tablet (20 mEq total) by mouth 2 (two) times daily.   PRESERVISION AREDS PO Take 1 tablet by mouth 2 (two) times daily.   zolpidem 10 MG tablet Commonly known as: AMBIEN TAKE 1 TABLET(10 MG) BY MOUTH AT BEDTIME What changed: See the new instructions.           Outstanding Labs/Studies   Repeat labs on 3/4: BMP, CBC, BNP  Duration of Discharge Encounter   Greater than 30 minutes including physician time.  SignedLily Kocher, PA-C 09/20/2022, 3:14 PM

## 2022-09-20 NOTE — Care Management Obs Status (Signed)
[facility] NOTIFICATION   Patient Details  Name: Leslie [name] MRN: [id number] Date of Birth: 11-24-1950   Medicare Observation Status Notification Given:  Yes    Zenon Mayo, RN 09/20/2022, 3:43 PM

## 2022-09-20 NOTE — Plan of Care (Signed)
Discharge instructions discussed with patient.  Patient instructed on home medications, restrictions, and follow up appointments. Belongings gathered and sent with patient.  Patients medications discussed, no new prescriptions.    Patient discharged via wheelchair by this Probation officer.

## 2022-09-20 NOTE — TOC Transition Note (Signed)
Transition of Care [facility]) - CM/SW Discharge Note   Patient Details  Name: Leslie [name] MRN: [id number] Date of Birth: Dec 21, 1950  [facility] CM/SW Contact:  Zenon Mayo, RN Phone Number: 09/20/2022, 3:45 PM   Clinical Narrative:    Patient is for dc today, she has no needs. [name] will be transporting her home.         Patient Goals and CMS Choice      Discharge Placement                         Discharge Plan and Services Additional resources added to the After Visit Summary for                                       Social Determinants of Health (SDOH) Interventions SDOH Screenings   Food Insecurity: No Food Insecurity (09/19/2022)  Housing: Low Risk  (09/20/2022)  Transportation Needs: No Transportation Needs (09/19/2022)  Utilities: Not At Risk (09/19/2022)  Alcohol Screen: Low Risk  (09/20/2022)  Depression (PHQ2-9): Low Risk  (06/17/2022)  Financial Resource Strain: Low Risk  (09/20/2022)  Physical Activity: Unknown (10/04/2021)  Social Connections: Unknown (10/04/2021)  Tobacco Use: Medium Risk (09/20/2022)     Readmission Risk Interventions     No data to display

## 2022-09-20 NOTE — Progress Notes (Signed)
  Discharge labs per Dr. Harrington Challenger.

## 2022-09-20 NOTE — Progress Notes (Signed)
Heart Failure Nurse Navigator Progress Note  PCP: Isaac Bliss, Rayford Halsted, MD PCP-Cardiologist: Harrington Challenger Admission Diagnosis: None Admitted from: Home  Presentation:   Leslie [name] presented for a R/L heart cath, per labs, admitted for hydration. BP 103/50, HR 73, BNP 170, creat 2.38.   Patient and daughter were educated on the sign and symptoms of heart failure, daily weights, when to call her doctor or go to the ED, Diet/ fluid restrictions, taking all medications as prescribed , and attending all medical appointments. Patient verbalized her understanding of education, a hospital HF TOC appointment was scheduled for 10/14/2022 @ 10 am.   ECHO/ LVEF: 25-30% HFrEF New  Clinical Course:  Past Medical History:  Diagnosis Date   ADD (attention deficit disorder)    Allergy    Anemia    Arthritis 07/22/2014   osteoarthritis left knee   Colon polyps    Hypertension    Left knee pain    chronic   Narcotic addiction ([place])    Obesity    post gastric bypass   Vitamin D deficiency      Social History   Socioeconomic History   Marital status: Widowed    Spouse name: Not on file   Number of children: Not on file   Years of education: Not on file   Highest education level: Associate degree: academic program  Occupational History   Not on file  Tobacco Use   Smoking status: Former    Types: Cigarettes    Quit date: 07/22/1992    Years since quitting: 30.1   Smokeless tobacco: Never  Vaping Use   Vaping Use: Never used  Substance and Sexual Activity   Alcohol use: Yes    Comment: wine weekly   Drug use: No   Sexual activity: Not on file  Other Topics Concern   Not on file  Social History Narrative   Not on file   Social Determinants of Health   Financial Resource Strain: Low Risk  (10/04/2021)   Overall Financial Resource Strain (CARDIA)    Difficulty of Paying Living Expenses: Not hard at all  Food Insecurity: No Food Insecurity (09/19/2022)   Hunger Vital Sign     Worried About Running Out of Food in the Last Year: Never true    [place] in the Last Year: Never true  Transportation Needs: No Transportation Needs (09/19/2022)   PRAPARE - Hydrologist (Medical): No    Lack of Transportation (Non-Medical): No  Physical Activity: Unknown (10/04/2021)   Exercise Vital Sign    Days of Exercise per Week: 0 days    Minutes of Exercise per Session: Not on file  Stress: Not on file  Social Connections: Unknown (10/04/2021)   Social Connection and Isolation Panel [NHANES]    Frequency of Communication with Friends and Family: Not on file    Frequency of Social Gatherings with Friends and Family: Once a week    Attends Religious Services: More than 4 times per year    Active Member of [employer] or Organizations: Not on file    Attends Archivist Meetings: Not on file    Marital Status: Widowed   Education Assessment and Provision:  Detailed education and instructions provided on heart failure disease management including the following:  Signs and symptoms of Heart Failure When to call the physician Importance of daily weights Low sodium diet Fluid restriction Medication management Anticipated future follow-up appointments  Patient education given on  each of the above topics.  Patient acknowledges understanding via teach back method and acceptance of all instructions.  Education Materials:  "Living Better With Heart Failure" Booklet, HF zone tool, & Daily Weight Tracker Tool.  Patient has scale at home: yes Patient has pill box at home: yes      High Risk Criteria for Readmission and/or Poor Patient Outcomes: Heart failure hospital admissions (last 6 months): 1  No Show rate: 9% Difficult social situation: No Demonstrates medication adherence: Yes Primary Language: English Literacy level: reading, writing, and comprehension  Barriers of Care:   New HF education Diet/ fluids/ daily  weights  Considerations/Referrals:   Referral made to Heart Failure Pharmacist Stewardship: Yes Referral made to Heart Failure CSW/NCM TOC: No Referral made to Heart & Vascular TOC clinic: Yes, 10/14/2022 @ 10 am  Items for Follow-up on DC/TOC: Continued HF education Diet/ fluid/ daily weights   Earnestine Leys, BSN, RN Heart Failure Transport planner Only

## 2022-09-20 NOTE — Consult Note (Signed)
   [facility] Inpatient Consult   09/20/2022  JERMIKA OILER 21-May-1951   Orientation with Natividad Brood, [facility] Liaison for review.   Location: [facility] Liaison met with pt  and daughter via bedside [facility]).   [facility]) Accountable Care Organization [ACO] Patient: Sport and exercise psychologist)    Primary Care Provider: Isaac Bliss, Rayford Halsted, MD At [facility]  Patient screened for less than 30 days readmission hospitalization with no scored noted for risk for planned admission. [facility] RN  assessed for potential [facility]) Care Management service needs for post hospital transition for care coordination. Pt was provided a 24 hours Nurse Advise Line magnet.  Pt denies any SDOHs at this time. Pt receptive to a possible follow up call post op discharge from [facility].   Plan:  HIPAA verified and [facility] RN liaison  will continue to follow ongoing disposition to assess for post hospital community care coordination/management needs.     [place] does not replace or interfere with any arrangements made by the Inpatient Transition of Care team.   For questions contact:   Raina Mina, RN, [place] Hours M-F 8:00 am to 4:30 pm [phone] [Office toll free line][facility] Office Hours are M-F 8:30 - 5 pm 24 hour nurse advise line [phone] Conceirge  [email or web address]

## 2022-09-20 NOTE — Progress Notes (Signed)
Mobility Specialist - Progress Note   09/20/22 1148  Mobility  Activity Ambulated with assistance in hallway  Level of Assistance Standby assist, set-up cues, supervision of patient - no hands on  Assistive Device Front wheel walker  Distance Ambulated (ft) 150 ft  Activity Response Tolerated well  Mobility Referral Yes  $Mobility charge 1 Mobility    Pt received in bed and agreeable. No complaints during walk, no physical assistance needed. Left in bed w/ call bell within reach and all needs met.   [place] Specialist Please contact via SecureChat or Rehab office at [phone]

## 2022-09-20 NOTE — Progress Notes (Signed)
   Heart Failure Stewardship Pharmacist Progress Note   PCP: Isaac Bliss, Rayford Halsted, MD PCP-Cardiologist: None    HPI:  72 yo F with PMH significant for HTN, ADHD, and CHF.  Patient was seen in the ED on 1/16-1/17/2024 for new onset CHF. CXR at that time showed pulmonary edema. She was treated with IV lasix and instructed to follow up with cardiology outpatient.  ECHO on 08/29/2022 showed EF 25-30% with severely decreased LV function, global hypokinesis, and moderate LV size.  LHC/RHC scheduled for 09/19/2022, however, on day of procedure was found to be hypokalemic. Admitted for management of AKI.    Current HF Medications: None   Prior to admission HF Medications: Diuretic: furosemide 40 mg once daily  Pertinent Lab Values: Serum creatinine 2.38, BUN 48, Potassium 3.3, Sodium 128, BNP 170.8, A1c 5.3 (02/2021) Vital Signs: Weight: 192 lbs (admission weight: 184 lbs) Blood pressure: 100-120s/50-70s  Heart rate: 70-80s  I/O: +0.419L yesterday; net +0.119L  Medication Assistance / Insurance Benefits Check: Does the patient have prescription insurance?  Yes Type of insurance plan: [employer] Medicare  Does the patient qualify for medication assistance through manufacturers or grants?   Pending Eligible grants and/or patient assistance programs: pending Medication assistance applications in progress: none  Medication assistance applications approved: none Approved medication assistance renewals will be completed by: pending  Outpatient Pharmacy:  Prior to admission outpatient pharmacy: [facility] Is the patient willing to use [place] pharmacy at discharge? Pending Is the patient willing to transition their outpatient pharmacy to utilize a [facility] outpatient pharmacy?   Pending    Assessment: 1. Acute on chronic systolic CHF (LVEF [id number]). NYHA class II symptoms. - Strict I/Os. Daily standing weights. Keep K>4 and Mg>2. - Not currently managed on GDMT therapy for HFrEF.   - Recommend SGLT2i, MRA, and RAAS therapy outpatient - Consider BB therapy outpatient as this should not impact kidney function   Plan: 1) Medication changes recommended at this time: - None at this time  2) Patient assistance: -Jardiance/Entresto copay: $40  3)  Education  - Patient has been educated on current HF medications and potential additions to HF medication regimen - Patient verbalizes understanding that over the next few months, these medication doses may change and more medications may be added to optimize HF regimen - Patient has been educated on basic disease state pathophysiology and goals of therapy   Maryan Puls, PharmD PGY-1 [facility] Resident

## 2022-09-20 NOTE — Plan of Care (Signed)
  Problem: Education: Goal: Understanding of CV disease, CV risk reduction, and recovery process will improve Outcome: Progressing   Problem: Activity: Goal: Ability to return to baseline activity level will improve Outcome: Progressing

## 2022-09-22 LAB — LIPOPROTEIN A (LPA): Lipoprotein (a): 48.5 nmol/L — ABNORMAL HIGH (ref <75.0)

## 2022-09-23 ENCOUNTER — Ambulatory Visit: Payer: Medicare PPO

## 2022-09-23 ENCOUNTER — Telehealth: Payer: Self-pay

## 2022-09-23 DIAGNOSIS — I5022 Chronic systolic (congestive) heart failure: Secondary | ICD-10-CM | POA: Diagnosis not present

## 2022-09-23 DIAGNOSIS — N179 Acute kidney failure, unspecified: Secondary | ICD-10-CM | POA: Diagnosis not present

## 2022-09-23 DIAGNOSIS — T148XXA Other injury of unspecified body region, initial encounter: Secondary | ICD-10-CM | POA: Diagnosis not present

## 2022-09-23 NOTE — Progress Notes (Signed)
Cardiology Office Note:    Date:  09/30/2022   ID:  DRAVEN LAINE, DOB 1951/02/07, MRN [id number]  PCP:  Isaac Bliss, Rayford Halsted, MD   [facility] Providers Cardiologist:  None     Referring MD: Isaac Bliss, Estel*   Chief Complaint: atrial fibrillation  History of Present Illness:    Leslie [name] is a very pleasant 72 y.o. 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  Referred to cardiology and seen by Dr. Harrington Challenger on 08/09/2022. She had no prior cardiac history. Seen in ER on 08/06/2022 with shortness of breath. CXR showed pulmonary edema. D-dimer was 1, CT angiogram negative for PE. BNP was 1711. Hs troponins mildly elevated at 50 >>49.  She was treated with IV Lasix.  After 20 hours in ER, her symptoms had improved and there were no options for admission bed. She was discharged and seen by PCP the following day. She felt very weak but SOB had improved.  At office visit with Dr. Harrington Challenger, she reported SOB started 2 weeks prior. She denied chest pain, palpitations, PND. Continued to take a few doses of Lasix 20 mg PRN after d/c. EKG revealed rhythm as MAT with mild tachycardia. Appeared mildly volume overloaded. BNP had increased to 5848 and she was advised to take Lasix 40 mg daily with potassium chloride 10 mEq. TTE revealed LVEF 25-30%, normal RV, moderate LAE. 7-day ZIO revealed sinus rhythm with atrial fibrillation, average HR 94 bpm. She was advised to start Eliquis 5 mg BID.  Right and left heart catheterization planned to assess coronary anatomy and pressures. Lasix was held around 2/9 due to hypokalemia then resumed. On 2/22 potassium supplementation increased to 40 mEq with persistently low K.  Catheterization scheduled for 2/29, but on day of procedure she was found to have K of 3.0, creatinine elevated to 3.38. Decision made to admit for management of AKI. With IV hydration, creatinine improved  to 2.38. Scheduled for repeat BMET 09/23/22. Unable to complete cath. Holding GDMT for CHF until lab work is stable.   Today, she is here alone for follow-up. Reports recent onset tremor upper and lower extremities since January. Leg shaking causes her to fall frequently. She has some mild bruising, but no significant bleeding problems. Has not felt episodes of heart racing. Prior to ED visit 1/16, felt like heart was going to beat out of chest, this has not occurred since that time. Had also noted increased fatigue prior to January. 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 Was told to remain off Lasix since hospital d/c 09/20/22. On one occasion, noted 5 lb weight gain so she took Lasix 40 mg. Also "felt puffy," no shortness of breath.  Had 4 lb weight gain 3/10, took Lasix again.  Weight improved on Lasix. Monitors weight daily. Denies any episodes of chest discomfort, dyspnea, orthopnea, PND, edema. Occasional dizziness since starting Eliquis, better now. Walking around inside and in her yard. No presyncope, syncope. Reports HR higher here today due to rushing because she was supposed to have someone drive her and they did not show up.   Past Medical History:  Diagnosis Date   ADD (attention deficit disorder)    Allergy    Anemia    Arthritis 07/22/2014   osteoarthritis left knee   Colon polyps  Hypertension    Left knee pain    chronic   Narcotic addiction ([place])    Obesity    post gastric bypass   Vitamin D deficiency     Past Surgical History:  Procedure Laterality Date   arthscopic knee surgery Left    several times   BREAST BIOPSY     BREAST SURGERY  years ago   breast biopsy   CHOLECYSTECTOMY     EYE SURGERY Bilateral 2013   Toric Lens implants   EYE SURGERY Right 2014   corneal amniotic membrane   GASTRIC BYPASS  2003   KIDNEY DONATION  2004   TOTAL KNEE  ARTHROPLASTY Left 06/16/2015   Procedure: LEFT TOTAL KNEE ARTHROPLASTY;  Surgeon: Mcarthur Rossetti, MD;  Location: [facility];  Service: Orthopedics;  Laterality: Left;   TOTAL KNEE ARTHROPLASTY Right 05/04/2021   Procedure: RIGHT TOTAL KNEE ARTHROPLASTY;  Surgeon: Mcarthur Rossetti, MD;  Location: [facility];  Service: Orthopedics;  Laterality: Right;    Current Medications: Current Meds  Medication Sig   ALPRAZolam (XANAX) 0.25 MG tablet TAKE 1 TABLET(0.25 MG) BY MOUTH AT BEDTIME AS NEEDED FOR ANXIETY (Patient taking differently: Take 0.25 mg by mouth daily as needed (anxiety).)   apixaban (ELIQUIS) 5 MG TABS tablet Take 1 tablet (5 mg total) by mouth 2 (two) times daily.   FLUoxetine (PROZAC) 40 MG capsule TAKE 1 CAPSULE(40 MG) BY MOUTH EVERY MORNING   furosemide (LASIX) 40 MG tablet Take 20 mg by mouth as needed. Take one half (0.5) tablet by mouth ( 20 mg) as needed for wt gain of 3 lbs in 24 hours or 5 lbs in one week.   lamoTRIgine (LAMICTAL) 100 MG tablet TAKE 1 TABLET(100 MG) BY MOUTH TWICE DAILY   metoprolol tartrate (LOPRESSOR) 25 MG tablet Take one (1) tablet by mouth as needed twice daily for palpitations or racing heart.   Multiple Vitamins-Minerals (PRESERVISION AREDS PO) Take 1 tablet by mouth 2 (two) times daily.   zolpidem (AMBIEN) 10 MG tablet TAKE 1 TABLET(10 MG) BY MOUTH AT BEDTIME (Patient taking differently: Take 10 mg by mouth at bedtime as needed for sleep.)     Allergies:   Amoxicillin and Sulfamethoxazole   Social History   Socioeconomic History   Marital status: Widowed    Spouse name: Not on file   Number of children: 2   Years of education: Not on file   Highest education level: Associate degree: academic program  Occupational History   Occupation: Retired  Tobacco Use   Smoking status: Former    Types: Cigarettes    Quit date: 07/22/1992    Years since quitting: 30.2   Smokeless tobacco: Never  Vaping Use   Vaping Use: Never used  Substance and  Sexual Activity   Alcohol use: Yes    Comment: wine weekly   Drug use: No   Sexual activity: Not on file  Other Topics Concern   Not on file  Social History Narrative   Not on file   Social Determinants of Health   Financial Resource Strain: Low Risk  (09/20/2022)   Overall Financial Resource Strain (CARDIA)    Difficulty of Paying Living Expenses: Not very hard  Food Insecurity: No Food Insecurity (09/24/2022)   Hunger Vital Sign    Worried About Running Out of Food in the Last Year: Never true    Ran Out of Food in the Last Year: Never true  Transportation Needs: No Transportation Needs (09/24/2022)  PRAPARE - Hydrologist (Medical): No    Lack of Transportation (Non-Medical): No  Physical Activity: Unknown (10/04/2021)   Exercise Vital Sign    Days of Exercise per Week: 0 days    Minutes of Exercise per Session: Not on file  Stress: Not on file  Social Connections: Unknown (10/04/2021)   Social Connection and Isolation Panel [NHANES]    Frequency of Communication with Friends and Family: Not on file    Frequency of Social Gatherings with Friends and Family: Once a week    Attends Religious Services: More than 4 times per year    Active Member of [employer] or Organizations: Not on file    Attends Archivist Meetings: Not on file    Marital Status: Widowed     Family History: The patient's family history includes COPD in her father; Diabetes in her sister; Heart disease in her brother and mother; Hypertension in her sister.  ROS:   Please see the history of present illness.    + bilateral upper and lower extremity tremors All other systems reviewed and are negative.  Labs/Other Studies Reviewed:    The following studies were reviewed today:  Echo 08/29/22 1. Left ventricular ejection fraction, by estimation, is 25 to 30%. The  left ventricle has severely decreased function. The left ventricle  demonstrates global hypokinesis. The left  ventricular internal cavity size  was moderately dilated. Left  ventricular diastolic parameters were normal.   2. Right ventricular systolic function is normal. The right ventricular  size is normal.   3. Left atrial size was moderately dilated.   4. The mitral valve is abnormal. Mild mitral valve regurgitation. No  evidence of mitral stenosis.   5. The aortic valve is normal in structure. There is mild calcification  of the aortic valve. There is mild thickening of the aortic valve. Aortic  valve regurgitation is mild to moderate. Aortic valve sclerosis is  present, with no evidence of aortic  valve stenosis.   6. The inferior vena cava is normal in size with greater than 50%  respiratory variability, suggesting right atrial pressure of 3 mmHg.   Cardiac Monitor 09/09/22 Sinus rhythm  Rates 79 to 124 bpm   Average HR 94 bpm Atrial fibrillation   6 episodes   (36% of time)  Longest episode lasted 2 days 10 hours    Rates 66 to 213 bpm) Frequent PVCs (7.8%)   Occasional PACs   Short bursts of SVT   Fastest for 5 beats at 203 bpm, longest 20 se  Recent Labs: 08/06/2022: ALT 28 08/30/2022: TSH 1.820 09/12/2022: NT-Pro BNP 1,775 09/23/2022: Hemoglobin 10.6; Platelets 310 09/27/2022: BNP 43.1; BUN 15; Creatinine, Ser 0.98; Potassium 4.3; Sodium 137  Recent Lipid Panel    Component Value Date/Time   CHOL 217 (H) 03/13/2021 1501   TRIG 46.0 03/13/2021 1501   HDL 112.30 03/13/2021 1501   CHOLHDL 2 03/13/2021 1501   VLDL 9.2 03/13/2021 1501   LDLCALC 95 03/13/2021 1501   LDLCALC 109 (H) 02/16/2020 1019   LDLDIRECT 109.1 07/27/2008 0942     Risk Assessment/Calculations:    CHA2DS2-VASc Score = 5  This indicates a 7.2% annual risk of stroke. The patient's score is based upon: CHF History: 1 HTN History: 1 Diabetes History: 0 Stroke History: 0 Vascular Disease History: 1 Age Score: 1 Gender Score: 1    Physical Exam:    VS:  BP 130/70 (BP Location: Left Arm, Patient Position:  Sitting, Cuff Size: Normal)   Pulse (!) 105   Ht 5\' 4"  (1.626 m)   Wt 190 lb 3.2 oz (86.3 kg)   SpO2 97%   BMI 32.65 kg/m     Wt Readings from Last 3 Encounters:  09/30/22 190 lb 3.2 oz (86.3 kg)  09/20/22 192 lb 0.3 oz (87.1 kg)  09/12/22 181 lb 0.4 oz (82.1 kg)     GEN:  Well nourished, well developed in no acute distress HEENT: Normal NECK: No JVD; No carotid bruits CARDIAC: Irregular RR, no murmurs, rubs, gallops RESPIRATORY:  Clear to auscultation without rales, wheezing or rhonchi  ABDOMEN: Soft, non-tender, non-distended MUSCULOSKELETAL:  No edema; No deformity. 2+ pedal pulses, equal bilaterally SKIN: Warm and dry NEUROLOGIC:  Alert and oriented x 3 PSYCHIATRIC:  Normal affect   EKG:  EKG is not ordered today.    Diagnoses:    1. Chronic HFrEF (heart failure with reduced ejection fraction) ([place])   2. AKI (acute kidney injury) ([place])   3. Medication management   4. PAF (paroxysmal atrial fibrillation) ([place])   5. Chronic anticoagulation   6. Hypokalemia   7. Tremor   8. History of nephrectomy    Assessment and Plan:     Acute on chronic HFrEF: Recent diagnosis HFrEF with LVEF 25 to 30% on echo 08/29/2022 following ED visit 1/16 for SOB, edema. New onset PAF also identified on monitor 2/19. Scheduled for R/L heart cath 3/1 which was cancelled due to AKI. Today, no evidence of volume overload. She denies dyspnea, orthopnea, PND, edema. Monitors weight daily. Has taken Lasix 40 mg on 2 occasions for weight gain. Advised her to take Lasix 20 mg for weight gain > 3 lbs in 24 hours or > 5 lbs in one week as well as Kdur. Low sodium diet < 2000 mg daily. Continue to hold GDMT in preparation for coronary CTA 3/18. Has appointment with AHF clinic after CT.   AKI/History of nephrectomy: Renal function has improved on most recent BMP 09/27/22. We will recheck in 3 days. Will change her Lasix to 20 mg PRN weight gain as noted above.   Hypokalemia: BMP 09/27/2022 with K+ 4.3. Has  coronary CTA scheduled for 3/18. Will recheck BMP in 3 days, prior to CT. Advised her to take potassium supplement on the days that she takes Lasix.  PAF on chronic anticoagulation: Irregular HR on exam with well controlled ventricular rate. She is overall asymptomatic. We discussed adding CCB or BB for rate control. She would like to await CT results. Advised her to take metoprolol tartrate 25 mg twice daily as needed for palpitations or HR > 120 bpm. No bleeding concerns. Continue Eliquis 5 mg twice daily which is appropriate dose.  Tremor: Recent tremor of bilateral upper and lower extremities. Reports she has discussed with other providers and is awaiting completion of cardiac testing. Would favor referral to Dr. Carles Collet, neurology.      Disposition: Keep your appointment in 2 weeks with Advanced Heart Failure clinic  Medication Adjustments/Labs and Tests Ordered: Current medicines are reviewed at length with the patient today.  Concerns regarding medicines are outlined above.  Orders Placed This Encounter  Procedures   Basic Metabolic Panel (BMET)   Meds ordered this encounter  Medications   metoprolol tartrate (LOPRESSOR) 25 MG tablet    Sig: Take one (1) tablet by mouth as needed twice daily for palpitations or racing heart.    Dispense:  30 tablet    Refill:  6    Patient Instructions  Medication Instructions:   START Metoprolol one (1) tablet by mouth ( 25 mg) twice daily as needed for palpitations or racing heart.   START Lasix one half (0.5) tablet by mouth ( 20 mg) as needed for wt gain of 3 lbs in 24 hours or 5 lbs in one week.   *If you need a refill on your cardiac medications before your next appointment, please call your pharmacy*   Lab Work:  Your physician recommends that you return for lab work on Thursday, March 14. You can come in on the day of your appointment anytime between 7:30-4:30.  If you have labs (blood work) drawn today and your tests are completely  normal, you will receive your results only by: [place] (if you have MyChart) OR A paper copy in the mail If you have any lab test that is abnormal or we need to change your treatment, we will call you to review the results.   Testing/Procedures:  None ordered.   Follow-Up: At [facility], you and your health needs are our priority.  As part of our continuing mission to provide you with exceptional heart care, we have created designated Provider Care Teams.  These Care Teams include your primary Cardiologist (physician) and Advanced Practice Providers (APPs -  Physician Assistants and Nurse Practitioners) who all work together to provide you with the care you need, when you need it.  We recommend signing up for the patient portal called "MyChart".  Sign up information is provided on this After Visit Summary.  MyChart is used to connect with patients for Virtual Visits (Telemedicine).  Patients are able to view lab/test results, encounter notes, upcoming appointments, etc.  Non-urgent messages can be sent to your provider as well.   To learn more about what you can do with MyChart, go to [email or web address].    Your next appointment:   2 week(s)  Provider:   Heart Failure Clinic     Other Instructions  Heart Failure Education: Weigh yourself EVERY morning after you go to the bathroom but before you eat or drink anything. Write this number down in a weight log/diary. If you gain 3 pounds overnight or 5 pounds in a week, call the office. Take your medicines as prescribed. If you have concerns about your medications, please call us before you stop taking them.  Eat low salt foods--Limit salt (sodium) to 2000 mg per day. This will help prevent your body from holding onto fluid. Read food labels as many processed foods have a lot of sodium, especially canned goods and prepackaged meats. If you would like some assistance choosing low sodium foods, we would be happy to set  you up with a nutritionist. Stay as active as you can everyday. Staying active will give you more energy and make your muscles stronger. Start with 5 minutes at a time and work your way up to 30 minutes a day. Break up your activities--do some in the morning and some in the afternoon. Start with 3 days per week and work your way up to 5 days as you can.  If you have chest pain, feel short of breath, dizzy, or lightheaded, STOP. If you don't feel better after a short rest, call 911. If you do feel better, call the office to let us know you have symptoms with exercise. Limit all fluids for the day to less than 2 liters. Fluid includes all drinks, coffee, juice, ice chips, soup, jello,  and all other liquids. DASH Eating Plan DASH stands for Dietary Approaches to Stop Hypertension. The DASH eating plan is a healthy eating plan that has been shown to: Reduce high blood pressure (hypertension). Reduce your risk for type 2 diabetes, heart disease, and stroke. Help with weight loss. What are tips for following this plan? Reading food labels Check food labels for the amount of salt (sodium) per serving. Choose foods with less than 5 percent of the Daily Value of sodium. Generally, foods with less than 300 milligrams (mg) of sodium per serving fit into this eating plan. To find whole grains, look for the word "whole" as the first word in the ingredient list. Shopping Buy products labeled as "low-sodium" or "no salt added." Buy fresh foods. Avoid canned foods and pre-made or frozen meals. Cooking Avoid adding salt when cooking. Use salt-free seasonings or herbs instead of table salt or sea salt. Check with your health care provider or pharmacist before using salt substitutes. Do not fry foods. Cook foods using healthy methods such as baking, boiling, grilling, roasting, and broiling instead. Cook with heart-healthy oils, such as olive, canola, avocado, soybean, or sunflower oil. Meal planning  Eat a  balanced diet that includes: 4 or more servings of fruits and 4 or more servings of vegetables each day. Try to fill one-half of your plate with fruits and vegetables. 6-8 servings of whole grains each day. Less than 6 oz (170 g) of lean meat, poultry, or fish each day. A 3-oz (85-g) serving of meat is about the same size as a deck of cards. One egg equals 1 oz (28 g). 2-3 servings of low-fat dairy each day. One serving is 1 cup (237 mL). 1 serving of nuts, seeds, or beans 5 times each week. 2-3 servings of heart-healthy fats. Healthy fats called omega-3 fatty acids are found in foods such as walnuts, flaxseeds, fortified milks, and eggs. These fats are also found in cold-water fish, such as sardines, salmon, and mackerel. Limit how much you eat of: Canned or prepackaged foods. Food that is high in trans fat, such as some fried foods. Food that is high in saturated fat, such as fatty meat. Desserts and other sweets, sugary drinks, and other foods with added sugar. Full-fat dairy products. Do not salt foods before eating. Do not eat more than 4 egg yolks a week. Try to eat at least 2 vegetarian meals a week. Eat more home-cooked food and less restaurant, buffet, and fast food. Lifestyle When eating at a restaurant, ask that your food be prepared with less salt or no salt, if possible. If you drink alcohol: Limit how much you use to: 0-1 drink a day for women who are not pregnant. 0-2 drinks a day for men. Be aware of how much alcohol is in your drink. In the [place] one drink equals one 12 oz bottle of beer (355 mL), one 5 oz glass of wine (148 mL), or one 1 oz glass of hard liquor (44 mL). General information Avoid eating more than 2,300 mg of salt a day. If you have hypertension, you may need to reduce your sodium intake to 1,500 mg a day. Work with your health care provider to maintain a healthy body weight or to lose weight. Ask what an ideal weight is for you. Get at least 30 minutes of  exercise that causes your heart to beat faster (aerobic exercise) most days of the week. Activities may include walking, swimming, or biking. Work with your health  care provider or dietitian to adjust your eating plan to your individual calorie needs. What foods should I eat? Fruits All fresh, dried, or frozen fruit. Canned fruit in natural juice (without added sugar). Vegetables Fresh or frozen vegetables (raw, steamed, roasted, or grilled). Low-sodium or reduced-sodium tomato and vegetable juice. Low-sodium or reduced-sodium tomato sauce and tomato paste. Low-sodium or reduced-sodium canned vegetables. Grains Whole-grain or whole-wheat bread. Whole-grain or whole-wheat pasta. Brown rice. [place]. Bulgur. Whole-grain and low-sodium cereals. Pita bread. Low-fat, low-sodium crackers. Whole-wheat flour tortillas. Meats and other proteins Skinless chicken or [place]. Ground chicken or [place]. Pork with fat trimmed off. Fish and seafood. Egg whites. Dried beans, peas, or lentils. Unsalted nuts, nut butters, and seeds. Unsalted canned beans. Lean cuts of beef with fat trimmed off. Low-sodium, lean precooked or cured meat, such as sausages or meat loaves. Dairy Low-fat (1%) or fat-free (skim) milk. Reduced-fat, low-fat, or fat-free cheeses. Nonfat, low-sodium ricotta or cottage cheese. Low-fat or nonfat yogurt. Low-fat, low-sodium cheese. Fats and oils Soft margarine without trans fats. Vegetable oil. Reduced-fat, low-fat, or light mayonnaise and salad dressings (reduced-sodium). Canola, safflower, olive, avocado, soybean, and sunflower oils. Avocado. Seasonings and condiments Herbs. Spices. Seasoning mixes without salt. Other foods Unsalted popcorn and pretzels. Fat-free sweets. The items listed above may not be a complete list of foods and beverages you can eat. Contact a dietitian for more information. What foods should I avoid? Fruits Canned fruit in a light or heavy syrup. Fried fruit.  Fruit in cream or butter sauce. Vegetables Creamed or fried vegetables. Vegetables in a cheese sauce. Regular canned vegetables (not low-sodium or reduced-sodium). Regular canned tomato sauce and paste (not low-sodium or reduced-sodium). Regular tomato and vegetable juice (not low-sodium or reduced-sodium). Angie Fava. Olives. Grains Baked goods made with fat, such as croissants, muffins, or some breads. Dry pasta or rice meal packs. Meats and other proteins Fatty cuts of meat. Ribs. Fried meat. Berniece Salines. Bologna, salami, and other precooked or cured meats, such as sausages or meat loaves. Fat from the back of a pig (fatback). Bratwurst. Salted nuts and seeds. Canned beans with added salt. Canned or smoked fish. Whole eggs or egg yolks. Chicken or [place] with skin. Dairy Whole or 2% milk, cream, and half-and-half. Whole or full-fat cream cheese. Whole-fat or sweetened yogurt. Full-fat cheese. Nondairy creamers. Whipped toppings. Processed cheese and cheese spreads. Fats and oils Butter. Stick margarine. Lard. Shortening. Ghee. Bacon fat. Tropical oils, such as coconut, palm kernel, or palm oil. Seasonings and condiments Onion salt, garlic salt, seasoned salt, table salt, and sea salt. Worcestershire sauce. Tartar sauce. Barbecue sauce. Teriyaki sauce. Soy sauce, including reduced-sodium. Steak sauce. Canned and packaged gravies. Fish sauce. Oyster sauce. Cocktail sauce. Store-bought horseradish. Ketchup. Mustard. Meat flavorings and tenderizers. Bouillon cubes. Hot sauces. Pre-made or packaged marinades. Pre-made or packaged taco seasonings. Relishes. Regular salad dressings. Other foods Salted popcorn and pretzels. The items listed above may not be a complete list of foods and beverages you should avoid. Contact a dietitian for more information. Where to find more information National Heart, Lung, and Blood Institute: https://wilson-eaton.com/ American Heart Association: www.heart.org Academy of Nutrition and  Dietetics: www.eatright.Roundup: www.kidney.org Summary The DASH eating plan is a healthy eating plan that has been shown to reduce high blood pressure (hypertension). It may also reduce your risk for type 2 diabetes, heart disease, and stroke. When on the DASH eating plan, aim to eat more fresh fruits and vegetables, whole grains, lean proteins, low-fat dairy, and  heart-healthy fats. With the DASH eating plan, you should limit salt (sodium) intake to 2,300 mg a day. If you have hypertension, you may need to reduce your sodium intake to 1,500 mg a day. Work with your health care provider or dietitian to adjust your eating plan to your individual calorie needs. This information is not intended to replace advice given to you by your health care provider. Make sure you discuss any questions you have with your health care provider. Document Revised: 06/11/2019 Document Reviewed: 06/11/2019 Elsevier Patient Education  2023 Dodge, Emmaline Life, NP  09/30/2022 12:31 PM    [place]

## 2022-09-23 NOTE — Transitions of Care (Post Inpatient/ED Visit) (Signed)
   09/23/2022  Name: Leslie [name] MRN: [id number] DOB: 19-May-1951  Today's TOC FU Call Status: Today's TOC FU Call Status:: Unsuccessul Call (1st Attempt) Unsuccessful Call (1st Attempt) Date: 09/23/22  Attempted to reach the patient regarding the most recent Inpatient/ED visit.  Follow Up Plan: Additional outreach attempts will be made to reach the patient to complete the Transitions of Care (Post Inpatient/ED visit) call.      Enzo Montgomery, RN,BSN,CCM [facility] Care Management Care Management Community Coordinator Direct Phone: [phone] Toll Free: [phone] Fax: [phone]

## 2022-09-24 ENCOUNTER — Telehealth: Payer: Self-pay

## 2022-09-24 ENCOUNTER — Encounter: Payer: Self-pay | Admitting: Internal Medicine

## 2022-09-24 DIAGNOSIS — I509 Heart failure, unspecified: Secondary | ICD-10-CM

## 2022-09-24 DIAGNOSIS — I5022 Chronic systolic (congestive) heart failure: Secondary | ICD-10-CM

## 2022-09-24 LAB — BASIC METABOLIC PANEL
BUN/Creatinine Ratio: 21 (ref 12–28)
BUN: 21 mg/dL (ref 8–27)
CO2: 22 mmol/L (ref 20–29)
Calcium: 9.3 mg/dL (ref 8.7–10.3)
Chloride: 97 mmol/L (ref 96–106)
Creatinine, Ser: 1.02 mg/dL — ABNORMAL HIGH (ref 0.57–1.00)
Glucose: 95 mg/dL (ref 70–99)
Potassium: 5.5 mmol/L — ABNORMAL HIGH (ref 3.5–5.2)
Sodium: 134 mmol/L (ref 134–144)
eGFR: 58 mL/min/{1.73_m2} — ABNORMAL LOW (ref >59)

## 2022-09-24 LAB — CBC
Hematocrit: 32.1 % — ABNORMAL LOW (ref 34.0–46.6)
Hemoglobin: 10.6 g/dL — ABNORMAL LOW (ref 11.1–15.9)
MCH: 32.8 pg (ref 26.6–33.0)
MCHC: 33 g/dL (ref 31.5–35.7)
MCV: 99 fL — ABNORMAL HIGH (ref 79–97)
Platelets: 310 10*3/uL (ref 150–450)
RBC: 3.23 x10E6/uL — ABNORMAL LOW (ref 3.77–5.28)
RDW: 11.8 % (ref 11.7–15.4)
WBC: 5.5 10*3/uL (ref 3.4–10.8)

## 2022-09-24 LAB — BRAIN NATRIURETIC PEPTIDE: BNP: 216 pg/mL — ABNORMAL HIGH (ref 0.0–100.0)

## 2022-09-24 NOTE — Telephone Encounter (Signed)
Called pt   No lasix for now   No potassium Pt feels OK   Wants to try water aerobics Please set up for CT coronary angiogram after next Friday    Has appt with PA on Monday

## 2022-09-24 NOTE — Transitions of Care (Post Inpatient/ED Visit) (Signed)
   09/24/2022  Name: Leslie [name] MRN: [id number] DOB: Apr 09, 1951  Today's TOC FU Call Status: Today's TOC FU Call Status:: Successful TOC FU Call Competed TOC FU Call Complete Date: 09/24/22  Transition Care Management Follow-up Telephone Call Date of Discharge: 09/20/22 [facility]) Type of Discharge: Inpatient Admission Primary Inpatient Discharge Diagnosis:: "renal insufficiency" How have you been since you were released from the hospital?: Better Any questions or concerns?: Yes Patient Questions/Concerns:: Patient states that Dr. Harrington Challenger called her this morning and gave some instructions/changes on meds but she can not remember all the info provided and wants to make sure she is taking meds correctly Patient Questions/Concerns Addressed: Other: (RN CM unable to see phone call encounter and med changes-patient will send mychart message to provider to get further clarification)  Items Reviewed: Did you receive and understand the discharge instructions provided?: Yes Medications obtained and verified?: Yes (Medications Reviewed) Any new allergies since your discharge?: No Dietary orders reviewed?: Yes Type of Diet Ordered:: low salt/heart healthy Do you have support at home?: Yes People in Home: child(ren), adult Name of Support/Comfort Primary Source: [place] and [place] and Equipment/Supplies: Were Home Health Services Ordered?: NA Any new equipment or medical supplies ordered?: NA  Functional Questionnaire: Do you need assistance with bathing/showering or dressing?: No Do you need assistance with meal preparation?: No Do you need assistance with eating?: No Do you have difficulty maintaining continence: No Do you need assistance with getting out of bed/getting out of a chair/moving?: No Do you have difficulty managing or taking your medications?: No  Folllow up appointments reviewed: PCP Follow-up appointment confirmed?: NA Specialist  Hospital Follow-up appointment confirmed?: Yes Date of Specialist follow-up appointment?: 09/30/22 Follow-Up Specialty Provider:: Lorenda Peck Do you need transportation to your follow-up appointment?: No Do you understand care options if your condition(s) worsen?: Yes-patient verbalized understanding  SDOH Interventions Today    Flowsheet Row Most Recent Value  SDOH Interventions   Food Insecurity Interventions Intervention Not Indicated  Transportation Interventions Intervention Not Indicated      TOC Interventions Today    Flowsheet Row Most Recent Value  TOC Interventions   TOC Interventions Discussed/Reviewed TOC Interventions Discussed, Post discharge activity limitations per provider      Interventions Today    Flowsheet Row Most Recent Value  Education Interventions   Education Provided Provided Education  [cardiac mgmt]  Provided Verbal Education On Nutrition, Medication, When to see the doctor  Nutrition Interventions   Nutrition Discussed/Reviewed Nutrition Discussed, Decreasing salt  Pharmacy Interventions   Pharmacy Dicussed/Reviewed Pharmacy Topics Discussed, Medications and their functions  Safety Interventions   Safety Discussed/Reviewed Safety Discussed, Fall Risk  [pt wants to get handicapp placard-instructions provided on how to obtain paperwork online-print, fill out and take to upcoming appt]       Enzo Montgomery, Sindy Guadeloupe [facility] Care Management Care Management Community Coordinator Direct Phone: [phone] Toll Free: [phone] Fax: [phone]

## 2022-09-25 ENCOUNTER — Other Ambulatory Visit: Payer: Self-pay | Admitting: Internal Medicine

## 2022-09-25 DIAGNOSIS — G4700 Insomnia, unspecified: Secondary | ICD-10-CM

## 2022-09-25 DIAGNOSIS — I10 Essential (primary) hypertension: Secondary | ICD-10-CM

## 2022-09-25 DIAGNOSIS — I509 Heart failure, unspecified: Secondary | ICD-10-CM

## 2022-09-26 ENCOUNTER — Encounter: Payer: Self-pay | Admitting: Radiology

## 2022-09-26 ENCOUNTER — Telehealth: Payer: Self-pay

## 2022-09-26 ENCOUNTER — Other Ambulatory Visit: Payer: Self-pay

## 2022-09-26 DIAGNOSIS — N179 Acute kidney failure, unspecified: Secondary | ICD-10-CM

## 2022-09-26 MED ORDER — METOPROLOL TARTRATE 100 MG PO TABS: ORAL_TABLET | ORAL | 0 refills | Status: DC

## 2022-09-26 NOTE — Telephone Encounter (Signed)
Patient is scheduled for repeat BMET & BNP on 09/27/22. Patient voiced understanding.

## 2022-09-26 NOTE — Telephone Encounter (Signed)
-----   Message from Fay Records, MD sent at 09/24/2022  8:54 PM EST ----- Reviewed with pt    No lasix or KCL    BMET and BNP on Friday    APp appt next Monday Needs CT coronary angiogram afte BMET

## 2022-09-27 ENCOUNTER — Ambulatory Visit: Payer: Medicare PPO | Attending: Internal Medicine

## 2022-09-27 DIAGNOSIS — N179 Acute kidney failure, unspecified: Secondary | ICD-10-CM | POA: Diagnosis not present

## 2022-09-29 LAB — BASIC METABOLIC PANEL
BUN/Creatinine Ratio: 15 (ref 12–28)
BUN: 15 mg/dL (ref 8–27)
CO2: 24 mmol/L (ref 20–29)
Calcium: 9.7 mg/dL (ref 8.7–10.3)
Chloride: 97 mmol/L (ref 96–106)
Creatinine, Ser: 0.98 mg/dL (ref 0.57–1.00)
Glucose: 92 mg/dL (ref 70–99)
Potassium: 4.3 mmol/L (ref 3.5–5.2)
Sodium: 137 mmol/L (ref 134–144)
eGFR: 61 mL/min/{1.73_m2} (ref >59)

## 2022-09-29 LAB — BRAIN NATRIURETIC PEPTIDE: BNP: 43.1 pg/mL (ref 0.0–100.0)

## 2022-09-30 ENCOUNTER — Other Ambulatory Visit: Payer: Self-pay | Admitting: Internal Medicine

## 2022-09-30 ENCOUNTER — Encounter: Payer: Self-pay | Admitting: Nurse Practitioner

## 2022-09-30 ENCOUNTER — Ambulatory Visit: Payer: Medicare PPO | Attending: Nurse Practitioner | Admitting: Nurse Practitioner

## 2022-09-30 VITALS — BP 130/70 | HR 105 | Ht 64.0 in | Wt 190.2 lb

## 2022-09-30 DIAGNOSIS — I5022 Chronic systolic (congestive) heart failure: Secondary | ICD-10-CM

## 2022-09-30 DIAGNOSIS — N179 Acute kidney failure, unspecified: Secondary | ICD-10-CM

## 2022-09-30 DIAGNOSIS — R251 Tremor, unspecified: Secondary | ICD-10-CM

## 2022-09-30 DIAGNOSIS — Z79899 Other long term (current) drug therapy: Secondary | ICD-10-CM

## 2022-09-30 DIAGNOSIS — I480 Paroxysmal atrial fibrillation: Secondary | ICD-10-CM | POA: Diagnosis not present

## 2022-09-30 DIAGNOSIS — E876 Hypokalemia: Secondary | ICD-10-CM

## 2022-09-30 DIAGNOSIS — Z905 Acquired absence of kidney: Secondary | ICD-10-CM | POA: Diagnosis not present

## 2022-09-30 DIAGNOSIS — Z7901 Long term (current) use of anticoagulants: Secondary | ICD-10-CM

## 2022-09-30 MED ORDER — METOPROLOL TARTRATE 25 MG PO TABS: ORAL_TABLET | ORAL | 6 refills | Status: DC

## 2022-09-30 NOTE — Telephone Encounter (Signed)
.  Prescription Request  09/30/2022  LOV: 08/08/2022  What is the name of the medication or equipment? Generic adderall 30 mg er and generic adderall 20 mg  Have you contacted your pharmacy to request a refill? No   Which pharmacy would you like this sent to?  [facility] [place] [address] Phone: [phone] Fax: [phone]     Patient notified that their request is being sent to the clinical staff for review and that they should receive a response within 2 business days.   Please advise at Mobile [phone] (mobile)

## 2022-09-30 NOTE — Telephone Encounter (Signed)
Last office visit 08/08/22

## 2022-09-30 NOTE — Patient Instructions (Signed)
Medication Instructions:   START Metoprolol one (1) tablet by mouth ( 25 mg) twice daily as needed for palpitations or racing heart.   START Lasix one half (0.5) tablet by mouth ( 20 mg) as needed for wt gain of 3 lbs in 24 hours or 5 lbs in one week.   *If you need a refill on your cardiac medications before your next appointment, please call your pharmacy*   Lab Work:  Your physician recommends that you return for lab work on Thursday, March 14. You can come in on the day of your appointment anytime between 7:30-4:30.  If you have labs (blood work) drawn today and your tests are completely normal, you will receive your results only by: [place] (if you have MyChart) OR A paper copy in the mail If you have any lab test that is abnormal or we need to change your treatment, we will call you to review the results.   Testing/Procedures:  None ordered.   Follow-Up: At [facility], you and your health needs are our priority.  As part of our continuing mission to provide you with exceptional heart care, we have created designated Provider Care Teams.  These Care Teams include your primary Cardiologist (physician) and Advanced Practice Providers (APPs -  Physician Assistants and Nurse Practitioners) who all work together to provide you with the care you need, when you need it.  We recommend signing up for the patient portal called "MyChart".  Sign up information is provided on this After Visit Summary.  MyChart is used to connect with patients for Virtual Visits (Telemedicine).  Patients are able to view lab/test results, encounter notes, upcoming appointments, etc.  Non-urgent messages can be sent to your provider as well.   To learn more about what you can do with MyChart, go to [email or web address].    Your next appointment:   2 week(s)  Provider:   [facility]     Other Instructions  Heart Failure Education: Weigh yourself EVERY morning after you go  to the bathroom but before you eat or drink anything. Write this number down in a weight log/diary. If you gain 3 pounds overnight or 5 pounds in a week, call the office. Take your medicines as prescribed. If you have concerns about your medications, please call us before you stop taking them.  Eat low salt foods--Limit salt (sodium) to 2000 mg per day. This will help prevent your body from holding onto fluid. Read food labels as many processed foods have a lot of sodium, especially canned goods and prepackaged meats. If you would like some assistance choosing low sodium foods, we would be happy to set you up with a nutritionist. Stay as active as you can everyday. Staying active will give you more energy and make your muscles stronger. Start with 5 minutes at a time and work your way up to 30 minutes a day. Break up your activities--do some in the morning and some in the afternoon. Start with 3 days per week and work your way up to 5 days as you can.  If you have chest pain, feel short of breath, dizzy, or lightheaded, STOP. If you don't feel better after a short rest, call 911. If you do feel better, call the office to let us know you have symptoms with exercise. Limit all fluids for the day to less than 2 liters. Fluid includes all drinks, coffee, juice, ice chips, soup, jello, and all other liquids. DASH Eating Plan DASH  stands for Dietary Approaches to Stop Hypertension. The DASH eating plan is a healthy eating plan that has been shown to: Reduce high blood pressure (hypertension). Reduce your risk for type 2 diabetes, heart disease, and stroke. Help with weight loss. What are tips for following this plan? Reading food labels Check food labels for the amount of salt (sodium) per serving. Choose foods with less than 5 percent of the Daily Value of sodium. Generally, foods with less than 300 milligrams (mg) of sodium per serving fit into this eating plan. To find whole grains, look for the word  "whole" as the first word in the ingredient list. Shopping Buy products labeled as "low-sodium" or "no salt added." Buy fresh foods. Avoid canned foods and pre-made or frozen meals. Cooking Avoid adding salt when cooking. Use salt-free seasonings or herbs instead of table salt or sea salt. Check with your health care provider or pharmacist before using salt substitutes. Do not fry foods. Cook foods using healthy methods such as baking, boiling, grilling, roasting, and broiling instead. Cook with heart-healthy oils, such as olive, canola, avocado, soybean, or sunflower oil. Meal planning  Eat a balanced diet that includes: 4 or more servings of fruits and 4 or more servings of vegetables each day. Try to fill one-half of your plate with fruits and vegetables. 6-8 servings of whole grains each day. Less than 6 oz (170 g) of lean meat, poultry, or fish each day. A 3-oz (85-g) serving of meat is about the same size as a deck of cards. One egg equals 1 oz (28 g). 2-3 servings of low-fat dairy each day. One serving is 1 cup (237 mL). 1 serving of nuts, seeds, or beans 5 times each week. 2-3 servings of heart-healthy fats. Healthy fats called omega-3 fatty acids are found in foods such as walnuts, flaxseeds, fortified milks, and eggs. These fats are also found in cold-water fish, such as sardines, salmon, and mackerel. Limit how much you eat of: Canned or prepackaged foods. Food that is high in trans fat, such as some fried foods. Food that is high in saturated fat, such as fatty meat. Desserts and other sweets, sugary drinks, and other foods with added sugar. Full-fat dairy products. Do not salt foods before eating. Do not eat more than 4 egg yolks a week. Try to eat at least 2 vegetarian meals a week. Eat more home-cooked food and less restaurant, buffet, and fast food. Lifestyle When eating at a restaurant, ask that your food be prepared with less salt or no salt, if possible. If you drink  alcohol: Limit how much you use to: 0-1 drink a day for women who are not pregnant. 0-2 drinks a day for men. Be aware of how much alcohol is in your drink. In the [place] one drink equals one 12 oz bottle of beer (355 mL), one 5 oz glass of wine (148 mL), or one 1 oz glass of hard liquor (44 mL). General information Avoid eating more than 2,300 mg of salt a day. If you have hypertension, you may need to reduce your sodium intake to 1,500 mg a day. Work with your health care provider to maintain a healthy body weight or to lose weight. Ask what an ideal weight is for you. Get at least 30 minutes of exercise that causes your heart to beat faster (aerobic exercise) most days of the week. Activities may include walking, swimming, or biking. Work with your health care provider or dietitian to adjust your eating  plan to your individual calorie needs. What foods should I eat? Fruits All fresh, dried, or frozen fruit. Canned fruit in natural juice (without added sugar). Vegetables Fresh or frozen vegetables (raw, steamed, roasted, or grilled). Low-sodium or reduced-sodium tomato and vegetable juice. Low-sodium or reduced-sodium tomato sauce and tomato paste. Low-sodium or reduced-sodium canned vegetables. Grains Whole-grain or whole-wheat bread. Whole-grain or whole-wheat pasta. Brown rice. [place]. Bulgur. Whole-grain and low-sodium cereals. Pita bread. Low-fat, low-sodium crackers. Whole-wheat flour tortillas. Meats and other proteins Skinless chicken or [place]. Ground chicken or [place]. Pork with fat trimmed off. Fish and seafood. Egg whites. Dried beans, peas, or lentils. Unsalted nuts, nut butters, and seeds. Unsalted canned beans. Lean cuts of beef with fat trimmed off. Low-sodium, lean precooked or cured meat, such as sausages or meat loaves. Dairy Low-fat (1%) or fat-free (skim) milk. Reduced-fat, low-fat, or fat-free cheeses. Nonfat, low-sodium ricotta or cottage cheese. Low-fat or  nonfat yogurt. Low-fat, low-sodium cheese. Fats and oils Soft margarine without trans fats. Vegetable oil. Reduced-fat, low-fat, or light mayonnaise and salad dressings (reduced-sodium). Canola, safflower, olive, avocado, soybean, and sunflower oils. Avocado. Seasonings and condiments Herbs. Spices. Seasoning mixes without salt. Other foods Unsalted popcorn and pretzels. Fat-free sweets. The items listed above may not be a complete list of foods and beverages you can eat. Contact a dietitian for more information. What foods should I avoid? Fruits Canned fruit in a light or heavy syrup. Fried fruit. Fruit in cream or butter sauce. Vegetables Creamed or fried vegetables. Vegetables in a cheese sauce. Regular canned vegetables (not low-sodium or reduced-sodium). Regular canned tomato sauce and paste (not low-sodium or reduced-sodium). Regular tomato and vegetable juice (not low-sodium or reduced-sodium). Angie Fava. Olives. Grains Baked goods made with fat, such as croissants, muffins, or some breads. Dry pasta or rice meal packs. Meats and other proteins Fatty cuts of meat. Ribs. Fried meat. Berniece Salines. Bologna, salami, and other precooked or cured meats, such as sausages or meat loaves. Fat from the back of a pig (fatback). Bratwurst. Salted nuts and seeds. Canned beans with added salt. Canned or smoked fish. Whole eggs or egg yolks. Chicken or [place] with skin. Dairy Whole or 2% milk, cream, and half-and-half. Whole or full-fat cream cheese. Whole-fat or sweetened yogurt. Full-fat cheese. Nondairy creamers. Whipped toppings. Processed cheese and cheese spreads. Fats and oils Butter. Stick margarine. Lard. Shortening. Ghee. Bacon fat. Tropical oils, such as coconut, palm kernel, or palm oil. Seasonings and condiments Onion salt, garlic salt, seasoned salt, table salt, and sea salt. Worcestershire sauce. Tartar sauce. Barbecue sauce. Teriyaki sauce. Soy sauce, including reduced-sodium. Steak sauce.  Canned and packaged gravies. Fish sauce. Oyster sauce. Cocktail sauce. Store-bought horseradish. Ketchup. Mustard. Meat flavorings and tenderizers. Bouillon cubes. Hot sauces. Pre-made or packaged marinades. Pre-made or packaged taco seasonings. Relishes. Regular salad dressings. Other foods Salted popcorn and pretzels. The items listed above may not be a complete list of foods and beverages you should avoid. Contact a dietitian for more information. Where to find more information National Heart, Lung, and Blood Institute: https://wilson-eaton.com/ American Heart Association: www.heart.org Academy of Nutrition and Dietetics: www.eatright.[place]: www.kidney.org Summary The DASH eating plan is a healthy eating plan that has been shown to reduce high blood pressure (hypertension). It may also reduce your risk for type 2 diabetes, heart disease, and stroke. When on the DASH eating plan, aim to eat more fresh fruits and vegetables, whole grains, lean proteins, low-fat dairy, and heart-healthy fats. With the DASH eating plan, you  should limit salt (sodium) intake to 2,300 mg a day. If you have hypertension, you may need to reduce your sodium intake to 1,500 mg a day. Work with your health care provider or dietitian to adjust your eating plan to your individual calorie needs. This information is not intended to replace advice given to you by your health care provider. Make sure you discuss any questions you have with your health care provider. Document Revised: 06/11/2019 Document Reviewed: 06/11/2019 Elsevier Patient Education  [place].

## 2022-10-01 ENCOUNTER — Ambulatory Visit: Payer: Medicare PPO

## 2022-10-03 ENCOUNTER — Ambulatory Visit: Payer: Medicare PPO | Attending: Internal Medicine

## 2022-10-03 DIAGNOSIS — N179 Acute kidney failure, unspecified: Secondary | ICD-10-CM | POA: Diagnosis not present

## 2022-10-03 DIAGNOSIS — Z79899 Other long term (current) drug therapy: Secondary | ICD-10-CM | POA: Diagnosis not present

## 2022-10-03 DIAGNOSIS — I5022 Chronic systolic (congestive) heart failure: Secondary | ICD-10-CM

## 2022-10-04 ENCOUNTER — Telehealth (HOSPITAL_COMMUNITY): Payer: Self-pay | Admitting: *Deleted

## 2022-10-04 ENCOUNTER — Encounter: Payer: Self-pay | Admitting: Internal Medicine

## 2022-10-04 LAB — BASIC METABOLIC PANEL
BUN/Creatinine Ratio: 17 (ref 12–28)
BUN: 18 mg/dL (ref 8–27)
CO2: 21 mmol/L (ref 20–29)
Calcium: 9.6 mg/dL (ref 8.7–10.3)
Chloride: 93 mmol/L — ABNORMAL LOW (ref 96–106)
Creatinine, Ser: 1.07 mg/dL — ABNORMAL HIGH (ref 0.57–1.00)
Glucose: 85 mg/dL (ref 70–99)
Potassium: 3.3 mmol/L — ABNORMAL LOW (ref 3.5–5.2)
Sodium: 134 mmol/L (ref 134–144)
eGFR: 55 mL/min/{1.73_m2} — ABNORMAL LOW (ref >59)

## 2022-10-04 NOTE — Telephone Encounter (Signed)
Reaching out to patient to offer assistance regarding upcoming cardiac imaging study; pt verbalizes understanding of appt date/time, parking situation and where to check in, pre-test NPO status and medications ordered, and verified current allergies; name and call back number provided for further questions should they arise  Gordy Clement RN Navigator Cardiac Imaging [facility] [phone] office [phone] cell  Patient to take 50mg  metoprolol tartrate two hours prior to her cardiac CT scan if her HR is greater 65bpm and 100mg  metoprolol tartrate if her HR is greater than 75bpm. She is aware to arrive at 9:30am.

## 2022-10-07 ENCOUNTER — Ambulatory Visit (HOSPITAL_COMMUNITY)
Admission: RE | Admit: 2022-10-07 | Discharge: 2022-10-07 | Disposition: A | Discharge status: Home / Self Care | Payer: Medicare PPO | Source: Ambulatory Visit | Attending: Internal Medicine | Admitting: Internal Medicine

## 2022-10-07 ENCOUNTER — Telehealth: Payer: Self-pay

## 2022-10-07 DIAGNOSIS — E7841 Elevated Lipoprotein(a): Secondary | ICD-10-CM

## 2022-10-07 DIAGNOSIS — I5022 Chronic systolic (congestive) heart failure: Secondary | ICD-10-CM | POA: Insufficient documentation

## 2022-10-07 DIAGNOSIS — I480 Paroxysmal atrial fibrillation: Secondary | ICD-10-CM

## 2022-10-07 DIAGNOSIS — I509 Heart failure, unspecified: Secondary | ICD-10-CM | POA: Insufficient documentation

## 2022-10-07 DIAGNOSIS — Z79899 Other long term (current) drug therapy: Secondary | ICD-10-CM

## 2022-10-07 MED ORDER — METOPROLOL TARTRATE 5 MG/5ML IV SOLN: 10.0000 mg | Freq: Once | INTRAVENOUS | Status: AC

## 2022-10-07 MED ORDER — NITROGLYCERIN 0.4 MG SL SUBL
SUBLINGUAL_TABLET | SUBLINGUAL | Status: AC
  Filled 2022-10-07: qty 2

## 2022-10-07 MED ORDER — AMPHETAMINE-DEXTROAMPHETAMINE 20 MG PO TABS: 20.0000 mg | ORAL_TABLET | Freq: Every day | ORAL | 0 refills | Status: DC

## 2022-10-07 MED ORDER — METOPROLOL TARTRATE 5 MG/5ML IV SOLN
INTRAVENOUS | Status: AC
  Administered 2022-10-07: 10 mg via INTRAVENOUS
  Filled 2022-10-07: qty 10

## 2022-10-07 MED ORDER — AMPHETAMINE-DEXTROAMPHET ER 30 MG PO CP24: 30.0000 mg | ORAL_CAPSULE | ORAL | 0 refills | Status: DC

## 2022-10-07 MED ORDER — ATORVASTATIN CALCIUM 40 MG PO TABS: 40.0000 mg | ORAL_TABLET | Freq: Every day | ORAL | 3 refills | Status: DC

## 2022-10-07 MED ORDER — IOHEXOL 350 MG/ML SOLN
100.0000 mL | Freq: Once | INTRAVENOUS | Status: AC | PRN
  Administered 2022-10-07: 100 mL via INTRAVENOUS

## 2022-10-07 MED ORDER — NITROGLYCERIN 0.4 MG SL SUBL
0.8000 mg | SUBLINGUAL_TABLET | Freq: Once | SUBLINGUAL | Status: AC
  Administered 2022-10-07: 0.8 mg via SUBLINGUAL

## 2022-10-07 NOTE — Telephone Encounter (Signed)
-----   Message from Thayer Headings, MD sent at 10/07/2022  3:02 PM EDT ----- Calcium score 153 which is 74 th percentile for age/sex Mild, non obstructive CAD  Her recent symptoms are not related to her non obstructive CAD but she does have CAC and mild cad  I would advise that  should have her LDL < 70.  Start atorvastatin 40 mg a day Check lipids , ALT in 3 months ( with results to Dr. Harrington Challenger).

## 2022-10-07 NOTE — Telephone Encounter (Signed)
Pt advised her results and verbalize understanding.   Will forward to Dr Harrington Challenger for follow up plans.. she is seeing the [facility] 10/14/22.   Will make lab appt based on when she is seeing Dr Harrington Challenger back for her Lipid and ALT due in 3 mos per Dr Acie Fredrickson.

## 2022-10-07 NOTE — Telephone Encounter (Signed)
Refills sent

## 2022-10-10 ENCOUNTER — Encounter: Payer: Self-pay | Admitting: Internal Medicine

## 2022-10-10 DIAGNOSIS — Z79899 Other long term (current) drug therapy: Secondary | ICD-10-CM

## 2022-10-10 DIAGNOSIS — I5022 Chronic systolic (congestive) heart failure: Secondary | ICD-10-CM

## 2022-10-10 DIAGNOSIS — E7841 Elevated Lipoprotein(a): Secondary | ICD-10-CM

## 2022-10-14 ENCOUNTER — Encounter (HOSPITAL_COMMUNITY): Payer: Medicare PPO

## 2022-10-16 NOTE — Telephone Encounter (Signed)
Pt is very concerned about statin use and kidney function   She has one kidney     I picked lipitor.  I could back down on dose some    Thoughts?

## 2022-10-17 MED ORDER — ATORVASTATIN CALCIUM 10 MG PO TABS: 10.0000 mg | ORAL_TABLET | Freq: Every day | ORAL | 3 refills | Status: DC

## 2022-10-17 NOTE — Telephone Encounter (Signed)
I have reviewed with our Pharm D  With CAD recomm is to have LDL under tighter control I have many patients on it and have not had renal problems.  And lowering LDL can benefit kidney function  Would recomm 10 mg at night  Follow up CMET and lipomed in 6 wks

## 2022-10-25 ENCOUNTER — Other Ambulatory Visit: Payer: Self-pay | Admitting: Internal Medicine

## 2022-10-25 DIAGNOSIS — G479 Sleep disorder, unspecified: Secondary | ICD-10-CM

## 2022-10-28 ENCOUNTER — Encounter (HOSPITAL_COMMUNITY): Payer: Self-pay

## 2022-10-28 ENCOUNTER — Ambulatory Visit (HOSPITAL_COMMUNITY)
Admission: RE | Admit: 2022-10-28 | Discharge: 2022-10-28 | Disposition: A | Discharge status: Home / Self Care | Payer: Medicare PPO | Source: Ambulatory Visit | Attending: Cardiology | Admitting: Cardiology

## 2022-10-28 VITALS — BP 110/78 | HR 60 | Wt 192.6 lb

## 2022-10-28 DIAGNOSIS — I472 Ventricular tachycardia, unspecified: Secondary | ICD-10-CM | POA: Insufficient documentation

## 2022-10-28 DIAGNOSIS — I471 Supraventricular tachycardia, unspecified: Secondary | ICD-10-CM | POA: Insufficient documentation

## 2022-10-28 DIAGNOSIS — I110 Hypertensive heart disease with heart failure: Secondary | ICD-10-CM | POA: Diagnosis not present

## 2022-10-28 DIAGNOSIS — Z8249 Family history of ischemic heart disease and other diseases of the circulatory system: Secondary | ICD-10-CM | POA: Diagnosis not present

## 2022-10-28 DIAGNOSIS — I080 Rheumatic disorders of both mitral and aortic valves: Secondary | ICD-10-CM | POA: Diagnosis not present

## 2022-10-28 DIAGNOSIS — E669 Obesity, unspecified: Secondary | ICD-10-CM | POA: Diagnosis not present

## 2022-10-28 DIAGNOSIS — I5022 Chronic systolic (congestive) heart failure: Secondary | ICD-10-CM | POA: Insufficient documentation

## 2022-10-28 DIAGNOSIS — I2510 Atherosclerotic heart disease of native coronary artery without angina pectoris: Secondary | ICD-10-CM | POA: Insufficient documentation

## 2022-10-28 DIAGNOSIS — Z905 Acquired absence of kidney: Secondary | ICD-10-CM | POA: Diagnosis not present

## 2022-10-28 DIAGNOSIS — Z7901 Long term (current) use of anticoagulants: Secondary | ICD-10-CM | POA: Diagnosis not present

## 2022-10-28 DIAGNOSIS — I493 Ventricular premature depolarization: Secondary | ICD-10-CM | POA: Insufficient documentation

## 2022-10-28 DIAGNOSIS — I480 Paroxysmal atrial fibrillation: Secondary | ICD-10-CM | POA: Insufficient documentation

## 2022-10-28 DIAGNOSIS — Z79899 Other long term (current) drug therapy: Secondary | ICD-10-CM | POA: Diagnosis not present

## 2022-10-28 DIAGNOSIS — Z9884 Bariatric surgery status: Secondary | ICD-10-CM | POA: Insufficient documentation

## 2022-10-28 DIAGNOSIS — R269 Unspecified abnormalities of gait and mobility: Secondary | ICD-10-CM | POA: Diagnosis not present

## 2022-10-28 LAB — BASIC METABOLIC PANEL
Anion gap: 12 (ref 5–15)
BUN: 18 mg/dL (ref 8–23)
CO2: 27 mmol/L (ref 22–32)
Calcium: 9.2 mg/dL (ref 8.9–10.3)
Chloride: 96 mmol/L — ABNORMAL LOW (ref 98–111)
Creatinine, Ser: 1.53 mg/dL — ABNORMAL HIGH (ref 0.44–1.00)
GFR, Estimated: 36 mL/min — ABNORMAL LOW (ref >60)
Glucose, Bld: 87 mg/dL (ref 70–99)
Potassium: 4.2 mmol/L (ref 3.5–5.1)
Sodium: 135 mmol/L (ref 135–145)

## 2022-10-28 LAB — BRAIN NATRIURETIC PEPTIDE: B Natriuretic Peptide: 314.1 pg/mL — ABNORMAL HIGH (ref 0.0–100.0)

## 2022-10-28 LAB — MAGNESIUM: Magnesium: 2.1 mg/dL (ref 1.7–2.4)

## 2022-10-28 MED ORDER — AMIODARONE HCL 200 MG PO TABS: ORAL_TABLET | ORAL | 3 refills | Status: DC

## 2022-10-28 NOTE — Patient Instructions (Addendum)
EKG done today.  RedsClip done today.  Labs done today. We will contact you only if your labs are abnormal.  START Amiodarone 200mg  (1 tablet) by mouth 2 times daily for 14 DAYS THEN DECREASE to 200mg  (1 tablet) by mouth daily.   No other medication changes were made. Please continue all current medications as prescribed.  We will contact you at later date about the lifeVest.   You have been referred to Electrophysiology. They will contact you to schedule an appointment.   Your physician has requested that you have a cardiac MRI. Cardiac MRI uses a computer to create images of your heart as its beating, producing both still and moving pictures of your heart and major blood vessels. This has to be approved through your insurance company prior to scheduling, once approved we will contact you to schedule an appointment.   Your physician recommends that you schedule a follow-up appointment in: 2-3 weeks with Dr. [name] here in our office.   If you have any questions or concerns before your next appointment please send [place] a message through [place] or call our office at [phone].    TO LEAVE A MESSAGE FOR THE NURSE SELECT OPTION 2, PLEASE LEAVE A MESSAGE INCLUDING: YOUR NAME DATE OF BIRTH CALL BACK NUMBER REASON FOR CALL**this is important as we prioritize the call backs  YOU WILL RECEIVE A CALL BACK THE SAME DAY AS LONG AS YOU CALL BEFORE 4:00 PM   Do the following things EVERYDAY: Weigh yourself in the morning before breakfast. Write it down and keep it in a log. Take your medicines as prescribed Eat low salt foods--Limit salt (sodium) to 2000 mg per day.  Stay as active as you can everyday Limit all fluids for the day to less than 2 liters   At [facility], you and your health needs are our priority. As part of our continuing mission to provide you with exceptional heart care, we have created designated Provider Care Teams. These Care Teams include your  primary Cardiologist (physician) and Advanced Practice Providers (APPs- Physician Assistants and Nurse Practitioners) who all work together to provide you with the care you need, when you need it.   You may see any of the following providers on your designated Care Team at your next follow up: Dr Arvilla Meres Dr Carron Curie, NP Robbie Lis, [place] Karle Plumber, PharmD   Please be sure to bring in all your medications bottles to every appointment.

## 2022-10-28 NOTE — Progress Notes (Signed)
HEART & VASCULAR TRANSITION OF CARE CONSULT NOTE     Referring Physician: Dr. Tenny Craw  Primary Care: Philip Aspen, Limmie Patricia, MD Primary Cardiologist: Dr. Tenny Craw    HPI: Referred to clinic by Dr. Tenny Craw for heart failure consultation.   72 y/o female w/ h/o obesity s/p gastric bypass, s/p nephrectomy (donated kidney), HTN and recently diagnosed CHF and atrial fibrillation.   Recent ED presentation for dyspnea. Chest CT negative for PE, + borderline size mediastinal lymph nodes.  EKG MAT. CXR w/ CHF/ pulmonary edema, treated w/ IV lasix and referred to outpatient cardiology. Echo showed reduced LVEF 25-30%, global HK, mod LAE, mild MR, mild AoV thickening w/o sclerosis/stenosis, normal RV. 7 day Zio captured PAF (36% burden), NSVT (longest 5 beats) and frequent PVCs (7.8% burden). Started on Eliquis for afib. Initial plan for outpatient LHC, however study was deferred given development of AKI and hypokaliemia. Underwent coronary CTA 3/24 that showed only mild CAD (25-48% prox-mid LAD, 1-24% prox RCA). AKI and hypokalemia ultimately resolved. Not on any current GDMT. Takes lasix PRN.  She presents to [facility] clinic for assessment. Here w/ sister. Reports stable NYHA class II symptoms, though not very active at baseline. Limited by gait abnormalities and balance issues. Denies orthopnea/PND. No palpitations and was largely asymptomatic during the period she wore her Zio. Drinks ~2 glasses of wine a night.  Reds 26%. BP 110/78.      Cardiac Testing   2D Echo 2/24 1. Left ventricular ejection fraction, by estimation, is 25 to 30%. The  left ventricle has severely decreased function. The left ventricle  demonstrates global hypokinesis. The left ventricular internal cavity size  was moderately dilated. Left  ventricular diastolic parameters were normal.   2. Right ventricular systolic function is normal. The right ventricular  size is normal.   3. Left atrial size was moderately dilated.   4.  The mitral valve is abnormal. Mild mitral valve regurgitation. No  evidence of mitral stenosis.   5. The aortic valve is normal in structure. There is mild calcification  of the aortic valve. There is mild thickening of the aortic valve. Aortic  valve regurgitation is mild to moderate. Aortic valve sclerosis is  present, with no evidence of aortic  valve stenosis.   6. The inferior vena cava is normal in size with greater than 50%  respiratory variability, suggesting right atrial pressure of 3 mmHg.     08/30/22 Heart Monitor   Patient had a min HR of 66 bpm, max HR of 213 bpm, and avg HR of 116 bpm. Predominant underlying rhythm was Sinus Rhythm. Bundle Branch Block/IVCD was present. 18 Ventricular Tachycardia runs occurred, the run with the fastest interval lasting 4 beats  with a max rate of 187 bpm, the longest lasting 5 beats with an avg rate of 130 bpm. 14 Supraventricular Tachycardia runs occurred, the run with the fastest interval lasting 5 beats with a max rate of 203 bpm, the longest lasting 20.2 secs with an avg  rate of 160 bpm. Atrial Fibrillation occurred (36% burden), ranging from 98-213 bpm (avg of 147 bpm), the longest lasting 2 days 10 hours with an avg rate of 147 bpm. Atrial Fibrillation was detected within +/- 45 seconds of symptomatic patient event(s).  Isolated SVEs were occasional (3.4%, 39491), SVE Couplets were rare (<1.0%, 1847), and SVE Triplets were rare (<1.0%, 421). Isolated VEs were frequent (7.8%, 90053), VE Triplets were rare (<1.0%, 236), and no VE Couplets were present. Ventricular  Bigeminy and Trigeminy were present.   Coronary CTA 3/24 Calcium Score: Calcium noted in RCA and LAD   IMPRESSION: 1. Calcium score 153 which is 74 th percentile for age/sex   2.  Mildly dilated ascending thoracic aorta 3.8 cm   3.  CAD RADS 2 non obstructive CAD (25-48% prox-mid LAD, 1-24% prox RCA)   4.  Moderate Mitral annular Calcification    Review of Systems:  [y] = yes, [ ]  = no   General: Weight gain [ ] ; Weight loss [ ] ; Anorexia [ ] ; Fatigue [ ] ; Fever [ ] ; Chills [ ] ; Weakness [ ]   Cardiac: Chest pain/pressure [ ] ; Resting SOB [ ] ; Exertional SOB [Y ]; Orthopnea [ ] ; Pedal Edema [ ] ; Palpitations [ ] ; Syncope [ ] ; Presyncope [ ] ; Paroxysmal nocturnal dyspnea[ ]   Pulmonary: Cough [ ] ; Wheezing[ ] ; Hemoptysis[ ] ; Sputum [ ] ; Snoring [ ]   GI: Vomiting[ ] ; Dysphagia[ ] ; Melena[ ] ; Hematochezia [ ] ; Heartburn[ ] ; Abdominal pain [ ] ; Constipation [ ] ; Diarrhea [ ] ; BRBPR [ ]   GU: Hematuria[ ] ; Dysuria [ ] ; Nocturia[ ]   Vascular: Pain in legs with walking [ ] ; Pain in feet with lying flat [ ] ; Non-healing sores [ ] ; Stroke [ ] ; TIA [ ] ; Slurred speech [ ] ;  Neuro: Headaches[ ] ; Vertigo[ ] ; Seizures[ ] ; Paresthesias[ ] ;Blurred vision [ ] ; Diplopia [ ] ; Vision changes [ ]   Ortho/Skin: Arthritis [ ] ; Joint pain [ ] ; Muscle pain [ ] ; Joint swelling [ ] ; Back Pain [ ] ; Rash [ ]   Psych: Depression[ ] ; Anxiety[ ]   Heme: Bleeding problems [ ] ; Clotting disorders [ ] ; Anemia [ ]   Endocrine: Diabetes [ ] ; Thyroid dysfunction[ ]    Past Medical History:  Diagnosis Date   ADD (attention deficit disorder)    Allergy    Anemia    Arthritis 07/22/2014   osteoarthritis left knee   Colon polyps    Hypertension    Left knee pain    chronic   Narcotic addiction    Obesity    post gastric bypass   Vitamin D deficiency     Current Outpatient Medications  Medication Sig Dispense Refill   ALPRAZolam (XANAX) 0.25 MG tablet TAKE 1 TABLET(0.25 MG) BY MOUTH AT BEDTIME AS NEEDED FOR ANXIETY (Patient taking differently: Take 0.25 mg by mouth daily as needed (anxiety).) 30 tablet 2   amphetamine-dextroamphetamine (ADDERALL XR) 30 MG 24 hr capsule Take 1 capsule (30 mg total) by mouth every morning. 30 capsule 0   amphetamine-dextroamphetamine (ADDERALL) 20 MG tablet Take 1 tablet (20 mg total) by mouth daily. 30 tablet 0   apixaban (ELIQUIS) 5 MG TABS tablet Take 1  tablet (5 mg total) by mouth 2 (two) times daily. 180 tablet 3   atorvastatin (LIPITOR) 10 MG tablet Take 1 tablet (10 mg total) by mouth daily. 90 tablet 3   FLUoxetine (PROZAC) 40 MG capsule TAKE 1 CAPSULE(40 MG) BY MOUTH EVERY MORNING 90 capsule 1   furosemide (LASIX) 40 MG tablet Take 20 mg by mouth as needed. Take one half (0.5) tablet by mouth ( 20 mg) as needed for wt gain of 3 lbs in 24 hours or 5 lbs in one week.     lamoTRIgine (LAMICTAL) 100 MG tablet TAKE 1 TABLET(100 MG) BY MOUTH TWICE DAILY 180 tablet 1   metoprolol tartrate (LOPRESSOR) 25 MG tablet Take one (1) tablet by mouth as needed twice daily for palpitations or racing heart. 30 tablet 6  Multiple Vitamins-Minerals (PRESERVISION AREDS PO) Take 1 tablet by mouth 2 (two) times daily.     zolpidem (AMBIEN) 10 MG tablet TAKE 1 TABLET(10 MG) BY MOUTH AT BEDTIME (Patient taking differently: Take 10 mg by mouth at bedtime as needed for sleep.) 30 tablet 2   No current facility-administered medications for this encounter.    Allergies  Allergen Reactions   Amoxicillin     Hives   Sulfamethoxazole Nausea And Vomiting    See patient list of med intolerances due to h/o gastric bypass and nephrectomy      Social History   Socioeconomic History   Marital status: Widowed    Spouse name: Not on file   Number of children: 2   Years of education: Not on file   Highest education level: Associate degree: academic program  Occupational History   Occupation: Retired  Tobacco Use   Smoking status: Former    Types: Cigarettes    Quit date: 07/22/1992    Years since quitting: 30.2   Smokeless tobacco: Never  Vaping Use   Vaping Use: Never used  Substance and Sexual Activity   Alcohol use: Yes    Comment: wine weekly   Drug use: No   Sexual activity: Not on file  Other Topics Concern   Not on file  Social History Narrative   Not on file   Social Determinants of Health   Financial Resource Strain: Low Risk  (09/20/2022)    Overall Financial Resource Strain (CARDIA)    Difficulty of Paying Living Expenses: Not very hard  Food Insecurity: No Food Insecurity (09/24/2022)   Hunger Vital Sign    Worried About Running Out of Food in the Last Year: Never true    Ran Out of Food in the Last Year: Never true  Transportation Needs: No Transportation Needs (09/24/2022)   PRAPARE - Administrator, Civil ServiceTransportation    Lack of Transportation (Medical): No    Lack of Transportation (Non-Medical): No  Physical Activity: Unknown (10/04/2021)   Exercise Vital Sign    Days of Exercise per Week: 0 days    Minutes of Exercise per Session: Not on file  Stress: Not on file  Social Connections: Unknown (10/04/2021)   Social Connection and Isolation Panel [NHANES]    Frequency of Communication with Friends and Family: Not on file    Frequency of Social Gatherings with Friends and Family: Once a week    Attends Religious Services: More than 4 times per year    Active Member of [employer] Financial[employer]ubs or Organizations: Not on file    Attends BankerClub or Organization Meetings: Not on file    Marital Status: Widowed  Intimate Partner Violence: Not At Risk (09/19/2022)   Humiliation, Afraid, Rape, and Kick questionnaire    Fear of Current or Ex-Partner: No    Emotionally Abused: No    Physically Abused: No    Sexually Abused: No      Family History  Problem Relation Age of Onset   Heart disease Mother        post CABG history of CHF   COPD Father    Diabetes Sister    Hypertension Sister        post renal transplant   Heart disease Brother        CAD    Vitals:   10/28/22 1107  BP: 138/76  Pulse: 60  SpO2: 94%  Weight: 87.4 kg (192 lb 9.6 oz)    PHYSICAL EXAM: ReDs 26%  General:  Well appearing.  No respiratory difficulty HEENT: normal Neck: supple. no JVD. Carotids 2+ bilat; no bruits. No lymphadenopathy or thryomegaly appreciated. Cor: PMI nondisplaced. Regular rate & rhythm. No rubs, gallops or murmurs. Lungs: clear Abdomen: soft, nontender,  nondistended. No hepatosplenomegaly. No bruits or masses. Good bowel sounds. Extremities: no cyanosis, clubbing, rash, edema Neuro: alert & oriented x 3, cranial nerves grossly intact. moves all 4 extremities w/o difficulty. Affect pleasant.  ECG: NSR 62 bpm, QT 438 ms    ASSESSMENT & PLAN:  1. Chronic Systolic Heart Failure  - Echo 25-30%, Global HK. No prior study for comparison  - NICM, Coronary CTA 3/24 only mild CAD (25-48% prox-mid LAD, 1-24% prox RCA) - suspect possible tachy mediated/ PVC mediated vs infiltrative CM (7 day ZiO captured PAF (36% burden), NSVT (longest 5 beats) and frequent PVCs (7.8% burden), vs familial (mother and brother had CHF) vs ETOH component - arrange cMRI  - start amiodarone 200 mg bid x 2 wks>>200 mg daily  - repeat Zio in 4-6 wks to re quantify ectopy burden/ assess suppression after amiodarone load  - given EF < 35% and frequent ventricular ectopy/ NSVT on Zio, recommend LifeVest for primary prevention until completion of further w/u and repeat echo in ~3 months. If EF fails to improve, will need consideration of ICD  - NYHA Class II. Euvolemic on exam. ReDs only 26%. Continue Lasix only PRN - she is not on any current GDMT and concerned given solitary kidney and recent AKI. Will check BMP today. If SCr ok, will start low dose losartan 12.5 mg daily  - no SGLT2i given risk for volume depletion w/ low ReDs  - refer to the [facility] to be followed   2. PAF - captured on Zio, 36% burden - start amiodarone per above - continue Eliquis - discussed reduction of ETOH intake  - refer to EP for possible AF/PVC ablation   3. PVCs - significant burden, 7.8% on 7 day Zio, but borderline for inducing CM  - start amiodarone  - EF 20%, bridge w/ LifeVest  - needs sleep study to assess for OSA  - check BMP and Mg level  - refer to EP for possible PVC ablation   4. S/p Nephrectomy  - organ donor, has 1 kidney  - recent AKI w/ SCr > 3.0. Improved down to 1.07 on  f/u BMP - check BMP today     Referred to HFSW (PCP, Medications, Transportation, ETOH Abuse, Drug Abuse, Insurance, Financial ): No  Refer to Pharmacy: No Refer to Home Health:  No Refer to Advanced Heart Failure Clinic: Yes (Dr. [name])  Refer to General Cardiology: Yes (EP Clinic)   Follow up  w/ Dr. [name] in 2 wks. Will arrange for LifeVest

## 2022-10-28 NOTE — Progress Notes (Signed)
ReDS Vest / Clip - 10/28/22 1200       ReDS Vest / Clip   Station Marker B    Ruler Value 32    ReDS Value Range Low volume    ReDS Actual Value 26

## 2022-10-29 DIAGNOSIS — I420 Dilated cardiomyopathy: Secondary | ICD-10-CM | POA: Diagnosis not present

## 2022-10-30 ENCOUNTER — Telehealth (HOSPITAL_COMMUNITY): Payer: Self-pay

## 2022-10-30 DIAGNOSIS — I10 Essential (primary) hypertension: Secondary | ICD-10-CM

## 2022-10-30 MED ORDER — SPIRONOLACTONE 25 MG PO TABS: 12.5000 mg | ORAL_TABLET | Freq: Every day | ORAL | 5 refills | Status: DC

## 2022-10-30 NOTE — Telephone Encounter (Addendum)
  Pt aware, agreeable, and verbalized understanding Spironolactone 12.5 mg daily prescription sent in.Lab scheduled  ----- Message from Allayne Butcher, PA-C sent at 10/28/2022  5:21 PM EDT ----- Fluid marker (BNP) is mildly elevated. SCr up slightly but BUN ok. Add spiro 12.5 mg daily and repeat BMP and BNP on Friday.

## 2022-11-01 ENCOUNTER — Ambulatory Visit (HOSPITAL_COMMUNITY)
Admission: RE | Admit: 2022-11-01 | Discharge: 2022-11-01 | Disposition: A | Discharge status: Home / Self Care | Payer: Medicare PPO | Source: Ambulatory Visit | Attending: Cardiology | Admitting: Cardiology

## 2022-11-01 DIAGNOSIS — I5022 Chronic systolic (congestive) heart failure: Secondary | ICD-10-CM

## 2022-11-01 LAB — BASIC METABOLIC PANEL
Anion gap: 11 (ref 5–15)
BUN: 15 mg/dL (ref 8–23)
CO2: 26 mmol/L (ref 22–32)
Calcium: 9.5 mg/dL (ref 8.9–10.3)
Chloride: 98 mmol/L (ref 98–111)
Creatinine, Ser: 1.11 mg/dL — ABNORMAL HIGH (ref 0.44–1.00)
GFR, Estimated: 53 mL/min — ABNORMAL LOW (ref >60)
Glucose, Bld: 102 mg/dL — ABNORMAL HIGH (ref 70–99)
Potassium: 4.7 mmol/L (ref 3.5–5.1)
Sodium: 135 mmol/L (ref 135–145)

## 2022-11-01 LAB — BRAIN NATRIURETIC PEPTIDE: B Natriuretic Peptide: 224.1 pg/mL — ABNORMAL HIGH (ref 0.0–100.0)

## 2022-11-05 ENCOUNTER — Ambulatory Visit: Payer: Medicare PPO | Attending: Cardiovascular Disease | Admitting: Cardiovascular Disease

## 2022-11-05 ENCOUNTER — Encounter: Payer: Self-pay | Admitting: Cardiovascular Disease

## 2022-11-05 VITALS — BP 148/87 | HR 78 | Ht 64.0 in | Wt 190.0 lb

## 2022-11-05 DIAGNOSIS — I493 Ventricular premature depolarization: Secondary | ICD-10-CM | POA: Diagnosis not present

## 2022-11-05 DIAGNOSIS — I480 Paroxysmal atrial fibrillation: Secondary | ICD-10-CM

## 2022-11-05 NOTE — Patient Instructions (Signed)
Medication Instructions:  Your physician recommends that you continue on your current medications as directed. Please refer to the Current Medication list given to you today. *If you need a refill on your cardiac medications before your next appointment, please call your pharmacy*   Lab Work: If you have labs (blood work) drawn today and your tests are completely normal, you will receive your results only by: MyChart Message (if you have MyChart) OR A paper copy in the mail If you have any lab test that is abnormal or we need to change your treatment, we will call you to review the results.   Testing/Procedures: Ablation Your physician has recommended that you have an ablation. Catheter ablation is a medical procedure used to treat some cardiac arrhythmias (irregular heartbeats). During catheter ablation, a long, thin, flexible tube is put into a blood vessel in your groin (upper thigh), or neck. This tube is called an ablation catheter. It is then guided to your heart through the blood vessel. Radio frequency waves destroy small areas of heart tissue where abnormal heartbeats may cause an arrhythmia to start. Please see the instruction sheet given to you today.  You are scheduled for Atrial Fibrillation Ablation on Wednesday, September 25 with Dr. Halford Chessman.Please arrive at the Main Entrance A at [facility]: [address] [place], [place] [address] at 6:30 AM     Follow-Up: At [facility], you and your health needs are our priority.  As part of our continuing mission to provide you with exceptional heart care, we have created designated Provider Care Teams.  These Care Teams include your primary Cardiologist (physician) and Advanced Practice Providers (APPs -  Physician Assistants and Nurse Practitioners) who all work together to provide you with the care you need, when you need it.  We recommend signing up for the patient portal called "MyChart".  Sign up information is  provided on this After Visit Summary.  MyChart is used to connect with patients for Virtual Visits (Telemedicine).  Patients are able to view lab/test results, encounter notes, upcoming appointments, etc.  Non-urgent messages can be sent to your provider as well.   To learn more about what you can do with MyChart, go to [email or web address].    Your next appointment:   03/20/23  Provider:   York Pellant, MD

## 2022-11-05 NOTE — Progress Notes (Signed)
Electrophysiology Office Note:    Date:  11/05/2022   ID:  Leslie [name], DOB June 24, 1951, MRN [id number]  PCP:  Philip Aspen, Limmie Patricia, MD   [facility] Providers Cardiologist:  None Electrophysiologist:  Maurice Small, MD     Referring MD: Tana Felts*   History of Present Illness:    Leslie [name] is a 72 y.o. female with a hx listed below, significant for gastric bypass, nephrectomy, CHFrEF, and atrial fibrillation  referred for arrhythmia management.  She was seen in the ER in January, 2024 with new-onset CHF. ECG on 1/19 frequent PACs, possibly MAT. She has increased shortness of breath, poor energy attributable to heart failure. She does not have palpitations, syncope, pre-syncope.  Past Medical History:  Diagnosis Date   ADD (attention deficit disorder)    Allergy    Anemia    Arthritis 07/22/2014   osteoarthritis left knee   Colon polyps    Hypertension    Left knee pain    chronic   Narcotic addiction    Obesity    post gastric bypass   Vitamin D deficiency     Past Surgical History:  Procedure Laterality Date   arthscopic knee surgery Left    several times   BREAST BIOPSY     BREAST SURGERY  years ago   breast biopsy   CHOLECYSTECTOMY     EYE SURGERY Bilateral 2013   Toric Lens implants   EYE SURGERY Right 2014   corneal amniotic membrane   GASTRIC BYPASS  2003   KIDNEY DONATION  2004   TOTAL KNEE ARTHROPLASTY Left 06/16/2015   Procedure: LEFT TOTAL KNEE ARTHROPLASTY;  Surgeon: Kathryne Hitch, MD;  Location: [facility];  Service: Orthopedics;  Laterality: Left;   TOTAL KNEE ARTHROPLASTY Right 05/04/2021   Procedure: RIGHT TOTAL KNEE ARTHROPLASTY;  Surgeon: Kathryne Hitch, MD;  Location: [facility];  Service: Orthopedics;  Laterality: Right;    Current Medications: Current Meds  Medication Sig   ALPRAZolam (XANAX) 0.25 MG tablet TAKE 1 TABLET(0.25 MG) BY MOUTH AT BEDTIME AS NEEDED FOR ANXIETY (Patient  taking differently: Take 0.25 mg by mouth daily as needed (anxiety).)   amiodarone (PACERONE) 200 MG tablet Take 1 tablet (200 mg total) by mouth 2 (two) times daily for 14 days, THEN 1 tablet (200 mg total) daily.   amphetamine-dextroamphetamine (ADDERALL XR) 30 MG 24 hr capsule Take 1 capsule (30 mg total) by mouth every morning.   amphetamine-dextroamphetamine (ADDERALL) 20 MG tablet Take 1 tablet (20 mg total) by mouth daily.   apixaban (ELIQUIS) 5 MG TABS tablet Take 1 tablet (5 mg total) by mouth 2 (two) times daily.   atorvastatin (LIPITOR) 10 MG tablet Take 1 tablet (10 mg total) by mouth daily.   FLUoxetine (PROZAC) 40 MG capsule TAKE 1 CAPSULE(40 MG) BY MOUTH EVERY MORNING   furosemide (LASIX) 40 MG tablet Take 20 mg by mouth as needed. Take one half (0.5) tablet by mouth ( 20 mg) as needed for wt gain of 3 lbs in 24 hours or 5 lbs in one week.   lamoTRIgine (LAMICTAL) 100 MG tablet TAKE 1 TABLET(100 MG) BY MOUTH TWICE DAILY   metoprolol tartrate (LOPRESSOR) 25 MG tablet Take one (1) tablet by mouth as needed twice daily for palpitations or racing heart.   Multiple Vitamins-Minerals (PRESERVISION AREDS PO) Take 1 tablet by mouth 2 (two) times daily.   spironolactone (ALDACTONE) 25 MG tablet Take 0.5 tablets (12.5 mg total)  by mouth daily.   zolpidem (AMBIEN) 10 MG tablet TAKE 1 TABLET(10 MG) BY MOUTH AT BEDTIME     Allergies:   Amoxicillin and Sulfamethoxazole   Social and Family History: Reviewed in Epic  ROS:   Please see the history of present illness.    All other systems reviewed and are negative.  EKGs/Labs/Other Studies Reviewed Today:    Echocardiogram:  TTE 08/29/22 EF 25-30%, moderately dilated LV. LA moderately dilated    Monitors:  Zio 07/2022 Sinus rhythm HR 79-124, avg 124 AF 36%, Avg HR 47 BPM  Stress testing:    Advanced imaging:    EKG:  Last EKG results: today - sinus rhythm   Recent Labs: 08/06/2022: ALT 28 08/30/2022: TSH 1.820 09/12/2022:  NT-Pro BNP 1,775 09/23/2022: Hemoglobin 10.6; Platelets 310 10/28/2022: Magnesium 2.1 11/01/2022: B Natriuretic Peptide 224.1; BUN 15; Creatinine, Ser 1.11; Potassium 4.7; Sodium 135     Physical Exam:    VS:  BP (!) 148/87   Pulse 78   Ht  (1.626 m)   Wt 190 lb (86.2 kg)   SpO2 95%   BMI 32.61 kg/m     Wt Readings from Last 3 Encounters:  11/05/22 190 lb (86.2 kg)  10/28/22 192 lb 9.6 oz (87.4 kg)  09/30/22 190 lb 3.2 oz (86.3 kg)     GEN:  Well nourished, well developed in no acute distress CARDIAC: RRR, no murmurs, rubs, gallops RESPIRATORY:  Normal work of breathing MUSCULOSKELETAL: no edema    ASSESSMENT & PLAN:    CHFrEF Appears compensated today Switch to toprolol AF likely contributing Will need to reassess EF 90 days after GDMT -- preferably with restoration of sinus rhythm as well -- for ICD candidacy   Paroxysmal AF  Burden 36% We discussed studies that have shown mortality benefit of AF ablation Continue amiodarone for now  We discussed the indication, rationale, logistics, anticipated benefits, and potential risks of the ablation procedure including but not limited to -- bleed at the groin access site, chest pain, damage to nearby organs such as the diaphragm, lungs, or esophagus, need for a drainage tube, or prolonged hospitalization. I explained that the risk for stroke, heart attack, need for open chest surgery, or even death is very low but not zero. she  expressed understanding and wishes to proceed.   Frequent PVCs Burden about 9%. -- Unlikely to cause CHF Will reassess after period of GDMT and restoration of sinus rhythm    Solitary kidney GFR only slightly decreased Will defer additional GDMT to advanced CHF            Medication Adjustments/Labs and Tests Ordered: Current medicines are reviewed at length with the patient today.  Concerns regarding medicines are outlined above.  No orders of the defined types were placed in this  encounter.  No orders of the defined types were placed in this encounter.    Signed, Maurice Small, MD  11/05/2022 4:13 PM    [facility]

## 2022-11-12 ENCOUNTER — Encounter (HOSPITAL_COMMUNITY): Payer: Medicare PPO | Admitting: Cardiology

## 2022-11-12 ENCOUNTER — Other Ambulatory Visit: Payer: Self-pay | Admitting: Orthopaedic Surgery

## 2022-11-12 ENCOUNTER — Encounter (HOSPITAL_COMMUNITY): Payer: Self-pay

## 2022-11-13 ENCOUNTER — Other Ambulatory Visit: Payer: Self-pay | Admitting: Internal Medicine

## 2022-11-13 DIAGNOSIS — F339 Major depressive disorder, recurrent, unspecified: Secondary | ICD-10-CM

## 2022-11-15 ENCOUNTER — Ambulatory Visit (HOSPITAL_COMMUNITY)
Admission: RE | Admit: 2022-11-15 | Discharge: 2022-11-15 | Disposition: A | Discharge status: Home / Self Care | Payer: Medicare PPO | Source: Ambulatory Visit | Attending: Cardiology | Admitting: Cardiology

## 2022-11-15 ENCOUNTER — Other Ambulatory Visit: Payer: Self-pay

## 2022-11-15 VITALS — BP 130/80 | HR 73 | Wt 189.0 lb

## 2022-11-15 DIAGNOSIS — I480 Paroxysmal atrial fibrillation: Secondary | ICD-10-CM | POA: Diagnosis not present

## 2022-11-15 DIAGNOSIS — Z79899 Other long term (current) drug therapy: Secondary | ICD-10-CM | POA: Insufficient documentation

## 2022-11-15 DIAGNOSIS — I428 Other cardiomyopathies: Secondary | ICD-10-CM | POA: Insufficient documentation

## 2022-11-15 DIAGNOSIS — I5022 Chronic systolic (congestive) heart failure: Secondary | ICD-10-CM

## 2022-11-15 DIAGNOSIS — I493 Ventricular premature depolarization: Secondary | ICD-10-CM | POA: Diagnosis not present

## 2022-11-15 DIAGNOSIS — I5020 Unspecified systolic (congestive) heart failure: Secondary | ICD-10-CM | POA: Diagnosis not present

## 2022-11-15 DIAGNOSIS — I2510 Atherosclerotic heart disease of native coronary artery without angina pectoris: Secondary | ICD-10-CM | POA: Insufficient documentation

## 2022-11-15 DIAGNOSIS — Z905 Acquired absence of kidney: Secondary | ICD-10-CM | POA: Diagnosis not present

## 2022-11-15 MED ORDER — LOSARTAN POTASSIUM 25 MG PO TABS: 12.5000 mg | ORAL_TABLET | Freq: Every day | ORAL | 3 refills | Status: DC

## 2022-11-15 MED ORDER — METOPROLOL SUCCINATE ER 50 MG PO TB24: 50.0000 mg | ORAL_TABLET | Freq: Every evening | ORAL | 3 refills | Status: DC

## 2022-11-15 NOTE — Patient Instructions (Signed)
STOP Metoprolol   START Toprol XL 50 mg nightly  START Losartan 12.5 mg ( 1/2 Tab)  daily.  Your physician recommends that you schedule a follow-up appointment in: 6 Weeks  If you have any questions or concerns before your next appointment please send [place] a message through [place] or call our office at [phone].    TO LEAVE A MESSAGE FOR THE NURSE SELECT OPTION 2, PLEASE LEAVE A MESSAGE INCLUDING: YOUR NAME DATE OF BIRTH CALL BACK NUMBER REASON FOR CALL**this is important as we prioritize the call backs  YOU WILL RECEIVE A CALL BACK THE SAME DAY AS LONG AS YOU CALL BEFORE 4:00 PM  At the [facility], you and your health needs are our priority. As part of our continuing mission to provide you with exceptional heart care, we have created designated Provider Care Teams. These Care Teams include your primary Cardiologist (physician) and Advanced Practice Providers (APPs- Physician Assistants and Nurse Practitioners) who all work together to provide you with the care you need, when you need it.   You may see any of the following providers on your designated Care Team at your next follow up: Dr Arvilla Meres Dr Marca Ancona Dr. Marcos Eke, NP Robbie Lis, [place] [facility] [place], [place] Brynda Peon, NP Karle Plumber, PharmD   Please be sure to bring in all your medications bottles to every appointment.    Thank you for choosing [facility]

## 2022-11-15 NOTE — Progress Notes (Signed)
ADVANCED HEART FAILURE CLINIC NOTE  Referring Physician: Philip Aspen, Almira Bar*  Primary Care: Philip Aspen, Limmie Patricia, MD Primary Cardiologist: Dietrich Pates  HPI: Leslie [name] is a 72 y.o. female with heart failure with reduced ejection fraction, atrial fibrillation, history of obesity status post gastric bypass, history of nephrectomy presenting today to establish care.  Her cardiac history dates back to February 2024 when she was admitted to [facility] for dyspnea; echocardiogram at that time demonstrated an EF of 25 to 30% with global hypokinesis.  She was started on low-dose GDMT at that time.  She also had a 7-day Zio patch that captured a A-fib burden of 36%.  She underwent coronary CTA in March 2024 that showed only mild CAD.  Since that time she has been seen in [facility] clinic where GDMT was further uptitrated.  Interval history: From a functional standpoint she feels very well.  She does have dyspnea with moderate to excessive exertion but otherwise does not feel limited due to classic heart failure symptoms.  She cannot tell when she is in atrial fibrillation or having PVCs.  Activity level/exercise tolerance: NYHA IIb Orthopnea:  Sleeps on 2 pillows Paroxysmal noctural dyspnea: No Chest pain/pressure: No Orthostatic lightheadedness:  no Palpitations:  no Lower extremity edema: Infrequent, takes Lasix as needed for 2 to 3 pound weight gain and lower extremity edema Presyncope/syncope:  no Cough:  no  Past Medical History:  Diagnosis Date   ADD (attention deficit disorder)    Allergy    Anemia    Arthritis 07/22/2014   osteoarthritis left knee   Colon polyps    Hypertension    Left knee pain    chronic   Narcotic addiction (HCC)    Obesity    post gastric bypass   Vitamin D deficiency     Current Outpatient Medications  Medication Sig Dispense Refill   ALPRAZolam (XANAX) 0.25 MG tablet TAKE 1 TABLET(0.25 MG) BY MOUTH AT BEDTIME AS NEEDED FOR ANXIETY (Patient  taking differently: Take 0.25 mg by mouth daily as needed (anxiety).) 30 tablet 2   amiodarone (PACERONE) 200 MG tablet Take 200 mg by mouth daily.     amphetamine-dextroamphetamine (ADDERALL XR) 30 MG 24 hr capsule Take 1 capsule (30 mg total) by mouth every morning. 30 capsule 0   amphetamine-dextroamphetamine (ADDERALL) 20 MG tablet Take 1 tablet (20 mg total) by mouth daily. 30 tablet 0   apixaban (ELIQUIS) 5 MG TABS tablet Take 1 tablet (5 mg total) by mouth 2 (two) times daily. 180 tablet 3   atorvastatin (LIPITOR) 10 MG tablet Take 1 tablet (10 mg total) by mouth daily. 90 tablet 3   FLUoxetine (PROZAC) 40 MG capsule TAKE 1 CAPSULE(40 MG) BY MOUTH EVERY MORNING 90 capsule 1   furosemide (LASIX) 40 MG tablet Take 20 mg by mouth as needed. Take one half (0.5) tablet by mouth ( 20 mg) as needed for wt gain of 3 lbs in 24 hours or 5 lbs in one week.     lamoTRIgine (LAMICTAL) 100 MG tablet TAKE 1 TABLET(100 MG) BY MOUTH TWICE DAILY 180 tablet 1   losartan (COZAAR) 25 MG tablet Take 0.5 tablets (12.5 mg total) by mouth daily. 45 tablet 3   metoprolol succinate (TOPROL-XL) 50 MG 24 hr tablet Take 1 tablet (50 mg total) by mouth at bedtime. Take with or immediately following a meal. 90 tablet 3   Multiple Vitamins-Minerals (PRESERVISION AREDS PO) Take 1 tablet by mouth 2 (two) times daily.  potassium chloride (KLOR-CON) 10 MEQ tablet Take 10 mEq by mouth daily.     spironolactone (ALDACTONE) 25 MG tablet Take 0.5 tablets (12.5 mg total) by mouth daily. 30 tablet 5   zolpidem (AMBIEN) 10 MG tablet TAKE 1 TABLET(10 MG) BY MOUTH AT BEDTIME 30 tablet 0   No current facility-administered medications for this encounter.    Allergies  Allergen Reactions   Amoxicillin     Hives   Sulfamethoxazole Nausea And Vomiting    See patient list of med intolerances due to h/o gastric bypass and nephrectomy      Social History   Socioeconomic History   Marital status: Widowed    Spouse name: Not on  file   Number of children: 2   Years of education: Not on file   Highest education level: Associate degree: academic program  Occupational History   Occupation: Retired  Tobacco Use   Smoking status: Former    Types: Cigarettes    Quit date: 07/22/1992    Years since quitting: 30.3   Smokeless tobacco: Never  Vaping Use   Vaping Use: Never used  Substance and Sexual Activity   Alcohol use: Yes    Comment: wine weekly   Drug use: No   Sexual activity: Not on file  Other Topics Concern   Not on file  Social History Narrative   Not on file   Social Determinants of Health   Financial Resource Strain: Low Risk  (09/20/2022)   Overall Financial Resource Strain (CARDIA)    Difficulty of Paying Living Expenses: Not very hard  Food Insecurity: No Food Insecurity (09/24/2022)   Hunger Vital Sign    Worried About Running Out of Food in the Last Year: Never true    Ran Out of Food in the Last Year: Never true  Transportation Needs: No Transportation Needs (09/24/2022)   PRAPARE - Administrator, Civil Service (Medical): No    Lack of Transportation (Non-Medical): No  Physical Activity: Unknown (10/04/2021)   Exercise Vital Sign    Days of Exercise per Week: 0 days    Minutes of Exercise per Session: Not on file  Stress: Not on file  Social Connections: Unknown (10/04/2021)   Social Connection and Isolation Panel [NHANES]    Frequency of Communication with Friends and Family: Not on file    Frequency of Social Gatherings with Friends and Family: Once a week    Attends Religious Services: More than 4 times per year    Active Member of [employer] or Organizations: Not on file    Attends Banker Meetings: Not on file    Marital Status: Widowed  Intimate Partner Violence: Not At Risk (09/19/2022)   Humiliation, Afraid, Rape, and Kick questionnaire    Fear of Current or Ex-Partner: No    Emotionally Abused: No    Physically Abused: No    Sexually Abused: No       Family History  Problem Relation Age of Onset   Heart disease Mother        post CABG history of CHF   COPD Father    Diabetes Sister    Hypertension Sister        post renal transplant   Heart disease Brother        CAD    PHYSICAL EXAM: Vitals:   11/15/22 1425  BP: 130/80  Pulse: 73  SpO2: 98%   GENERAL: Well nourished, well developed, and in no apparent distress at rest.  HEENT: Negative for arcus senilis or xanthelasma. There is no scleral icterus.  The mucous membranes are pink and moist.   NECK: Supple, No masses. Normal carotid upstrokes without bruits. No masses or thyromegaly.    CHEST: There are no chest wall deformities. There is no chest wall tenderness. Respirations are unlabored.  Lungs-CTA bilaterally CARDIAC:  JVP: 7 cm H2O         Normal S1, S2  Normal rate with regular rhythm. No murmurs, rubs or gallops.  Pulses are 2+ and symmetrical in upper and lower extremities. no edema.  ABDOMEN: Soft, non-tender, non-distended. There are no masses or hepatomegaly. There are normal bowel sounds.  EXTREMITIES: Warm and well perfused with no cyanosis, clubbing.  LYMPHATIC: No axillary or supraclavicular lymphadenopathy.  NEUROLOGIC: Patient is oriented x3 with no focal or lateralizing neurologic deficits.  PSYCH: Patients affect is appropriate, there is no evidence of anxiety or depression.  SKIN: Warm and dry; no lesions or wounds.   DATA REVIEW  ECG: 11/15/2022: Normal sinus rhythm as per my personal interpretation  ECHO: 08/29/22: LVEF 25%, moderate MR, moderate AI.  As per my personal interpretation  CATH: Coronary CTA with mild nonobstructive CAD.   ASSESSMENT & PLAN:  Heart failure with reduced ejection fraction Etiology of HF: Nonischemic cardiomyopathy; coronary CTA from March 2024 with only mild CAD; possibly secondary to tachyarrhythmia.  Will plan for cardiac MRI. NYHA class / AHA Stage:NYHA II Volume status & Diuretics: euvolemic, only taking lasix  40mg  every other day.  Vasodilators:start losartan 12.5mg  daily Beta-Blocker:D/C metoprolol tartrate; start 50mg  succinate nightly WJX:[id number] spironolactone 12.5 mg daily Cardiometabolic:repeat labs in 1 week and plan to start jardiance 10mg  daily  Devices therapies & Valvulopathies: Not indicated Advanced therapies: Not indicated  2. Paroxysmal atrial fibrillation -Normal sinus rhythm today. -Plan for atrial fibrillation ablation with Dr. Nelly Laurence in September 2024  3. Nonobstructive coronary artery disease -Continue Lipitor 10 mg daily.  Followed by Dr. Dietrich Pates  4.  PVCs -Continue amiodarone 200 mg daily.  7.8% burden on 7-day Zio patch.  Following with Dr. Nelly Laurence for A-fib and possibly PVC ablation.  5.  Nephrectomy -Serum creatinine stable. -Starting losartan.  Will obtain repeat labs in 7 to 10 days.  Will plan to start SGLT2 inhibitor after repeat labs.  Leslie [name] Advanced Heart Failure Mechanical Circulatory Support

## 2022-11-21 ENCOUNTER — Encounter (HOSPITAL_COMMUNITY): Payer: Self-pay

## 2022-11-21 ENCOUNTER — Ambulatory Visit (HOSPITAL_COMMUNITY): Payer: Medicare PPO | Attending: Internal Medicine

## 2022-11-29 ENCOUNTER — Other Ambulatory Visit: Payer: Self-pay | Admitting: Internal Medicine

## 2022-11-29 DIAGNOSIS — G479 Sleep disorder, unspecified: Secondary | ICD-10-CM

## 2022-12-04 ENCOUNTER — Ambulatory Visit: Payer: Medicare PPO | Attending: Internal Medicine

## 2022-12-04 DIAGNOSIS — Z79899 Other long term (current) drug therapy: Secondary | ICD-10-CM

## 2022-12-04 DIAGNOSIS — E7841 Elevated Lipoprotein(a): Secondary | ICD-10-CM | POA: Diagnosis not present

## 2022-12-04 LAB — COMPREHENSIVE METABOLIC PANEL
ALT: 23 IU/L (ref 0–32)
Alkaline Phosphatase: 129 IU/L — ABNORMAL HIGH (ref 44–121)
Bilirubin Total: 0.4 mg/dL (ref 0.0–1.2)
Globulin, Total: 2 g/dL (ref 1.5–4.5)
Glucose: 96 mg/dL (ref 70–99)
Potassium: 4.6 mmol/L (ref 3.5–5.2)

## 2022-12-04 LAB — NMR, LIPOPROFILE

## 2022-12-07 LAB — NMR, LIPOPROFILE
Cholesterol, Total: 233 mg/dL — ABNORMAL HIGH (ref 100–199)
HDL Particle Number: 46 umol/L (ref >=30.5)
LDL Particle Number: 676 nmol/L (ref <1000)
LDL Size: 21.3 nm (ref >20.5)
LDL-C (NIH Calc): 89 mg/dL (ref 0–99)
LP-IR Score: 33 (ref <=45)
Triglycerides: 76 mg/dL (ref 0–149)

## 2022-12-07 LAB — COMPREHENSIVE METABOLIC PANEL
AST: 30 IU/L (ref 0–40)
Albumin/Globulin Ratio: 2.2 (ref 1.2–2.2)
Albumin: 4.4 g/dL (ref 3.8–4.8)
BUN/Creatinine Ratio: 20 (ref 12–28)
BUN: 24 mg/dL (ref 8–27)
CO2: 21 mmol/L (ref 20–29)
Calcium: 9.6 mg/dL (ref 8.7–10.3)
Chloride: 99 mmol/L (ref 96–106)
Creatinine, Ser: 1.19 mg/dL — ABNORMAL HIGH (ref 0.57–1.00)
Sodium: 135 mmol/L (ref 134–144)
Total Protein: 6.4 g/dL (ref 6.0–8.5)
eGFR: 49 mL/min/{1.73_m2} — ABNORMAL LOW (ref >59)

## 2022-12-17 ENCOUNTER — Telehealth: Payer: Self-pay

## 2022-12-17 ENCOUNTER — Other Ambulatory Visit: Payer: Self-pay

## 2022-12-17 DIAGNOSIS — Z79899 Other long term (current) drug therapy: Secondary | ICD-10-CM

## 2022-12-17 DIAGNOSIS — N179 Acute kidney failure, unspecified: Secondary | ICD-10-CM

## 2022-12-17 DIAGNOSIS — I5022 Chronic systolic (congestive) heart failure: Secondary | ICD-10-CM

## 2022-12-17 DIAGNOSIS — E7841 Elevated Lipoprotein(a): Secondary | ICD-10-CM

## 2022-12-17 DIAGNOSIS — I480 Paroxysmal atrial fibrillation: Secondary | ICD-10-CM

## 2022-12-17 MED ORDER — ATORVASTATIN CALCIUM 40 MG PO TABS: 40.0000 mg | ORAL_TABLET | Freq: Every day | ORAL | 3 refills | Status: DC

## 2022-12-17 NOTE — Telephone Encounter (Signed)
-----   Message from Dietrich Pates V, MD sent at 12/13/2022  3:30 PM EDT ----- LDL is 89   High given CAD  I would recomm Lipitor 40 mg   Follow up lipomed and liver function panel  in 8 wks

## 2022-12-17 NOTE — Telephone Encounter (Signed)
Pt advised her lab results and future labs added to her already scheduled appt with Dr Nelly Laurence for labs on 03/20/23 per her request.

## 2022-12-19 ENCOUNTER — Ambulatory Visit (HOSPITAL_COMMUNITY): Payer: Medicare PPO | Attending: Cardiology

## 2022-12-19 ENCOUNTER — Encounter: Payer: Self-pay | Admitting: Internal Medicine

## 2022-12-19 DIAGNOSIS — I5022 Chronic systolic (congestive) heart failure: Secondary | ICD-10-CM | POA: Insufficient documentation

## 2022-12-19 DIAGNOSIS — I428 Other cardiomyopathies: Secondary | ICD-10-CM | POA: Diagnosis not present

## 2022-12-19 DIAGNOSIS — Z1321 Encounter for screening for nutritional disorder: Secondary | ICD-10-CM

## 2022-12-19 LAB — ECHOCARDIOGRAM COMPLETE
Area-P 1/2: 1.34 cm2
MV M vel: 4.22 m/s
MV Peak grad: 71.2 mmHg
P 1/2 time: 1246 msec
S' Lateral: 3.8 cm

## 2022-12-23 ENCOUNTER — Other Ambulatory Visit: Payer: Self-pay | Admitting: Internal Medicine

## 2022-12-23 DIAGNOSIS — G479 Sleep disorder, unspecified: Secondary | ICD-10-CM

## 2022-12-26 ENCOUNTER — Other Ambulatory Visit: Payer: Self-pay | Admitting: Internal Medicine

## 2022-12-26 DIAGNOSIS — F339 Major depressive disorder, recurrent, unspecified: Secondary | ICD-10-CM

## 2022-12-27 ENCOUNTER — Encounter (HOSPITAL_COMMUNITY): Payer: Medicare PPO | Admitting: Cardiology

## 2022-12-27 ENCOUNTER — Encounter (HOSPITAL_COMMUNITY): Payer: Self-pay

## 2022-12-29 ENCOUNTER — Other Ambulatory Visit (HOSPITAL_COMMUNITY): Payer: Self-pay

## 2022-12-29 ENCOUNTER — Telehealth: Payer: Self-pay

## 2022-12-29 NOTE — Telephone Encounter (Signed)
Patient Advocate Encounter  Received notification from [facility] Prior Authorization for Amphetamine-Dextroamphetamine 20MG  tablets has been approved.   Effective: 12/16/2022 to 07/22/2023

## 2022-12-30 ENCOUNTER — Telehealth (HOSPITAL_COMMUNITY): Payer: Self-pay | Admitting: Emergency Medicine

## 2022-12-30 NOTE — Telephone Encounter (Signed)
Reaching out to patient to offer assistance regarding upcoming cardiac imaging study; pt verbalizes understanding of appt date/time, parking situation and where to check in, pre-test NPO status and medications ordered, and verified current allergies; name and call back number provided for further questions should they arise Rockwell Alexandria RN Navigator Cardiac Imaging [facility] [phone] office [phone] cell  Pt to take home PRN xanax for claustro prior to scan

## 2022-12-31 ENCOUNTER — Other Ambulatory Visit: Payer: Self-pay | Admitting: Internal Medicine

## 2022-12-31 ENCOUNTER — Ambulatory Visit (HOSPITAL_COMMUNITY): Admission: RE | Admit: 2022-12-31 | Payer: Medicare PPO | Source: Ambulatory Visit

## 2022-12-31 ENCOUNTER — Other Ambulatory Visit (HOSPITAL_COMMUNITY): Payer: Self-pay

## 2022-12-31 ENCOUNTER — Encounter (HOSPITAL_COMMUNITY): Payer: Self-pay

## 2022-12-31 ENCOUNTER — Ambulatory Visit (HOSPITAL_BASED_OUTPATIENT_CLINIC_OR_DEPARTMENT_OTHER)
Admission: RE | Admit: 2022-12-31 | Discharge: 2022-12-31 | Disposition: A | Discharge status: Home / Self Care | Payer: Medicare PPO | Source: Ambulatory Visit | Attending: Cardiology | Admitting: Cardiology

## 2022-12-31 VITALS — BP 138/88 | Wt 188.0 lb

## 2022-12-31 DIAGNOSIS — I5022 Chronic systolic (congestive) heart failure: Secondary | ICD-10-CM | POA: Diagnosis not present

## 2022-12-31 DIAGNOSIS — E785 Hyperlipidemia, unspecified: Secondary | ICD-10-CM | POA: Diagnosis present

## 2022-12-31 DIAGNOSIS — G479 Sleep disorder, unspecified: Secondary | ICD-10-CM

## 2022-12-31 DIAGNOSIS — F32A Depression, unspecified: Secondary | ICD-10-CM | POA: Diagnosis present

## 2022-12-31 DIAGNOSIS — M25551 Pain in right hip: Secondary | ICD-10-CM | POA: Insufficient documentation

## 2022-12-31 DIAGNOSIS — Z87891 Personal history of nicotine dependence: Secondary | ICD-10-CM | POA: Insufficient documentation

## 2022-12-31 DIAGNOSIS — S32119A Unspecified Zone I fracture of sacrum, initial encounter for closed fracture: Secondary | ICD-10-CM | POA: Diagnosis present

## 2022-12-31 DIAGNOSIS — E871 Hypo-osmolality and hyponatremia: Secondary | ICD-10-CM | POA: Diagnosis not present

## 2022-12-31 DIAGNOSIS — S32059D Unspecified fracture of fifth lumbar vertebra, subsequent encounter for fracture with routine healing: Secondary | ICD-10-CM | POA: Diagnosis not present

## 2022-12-31 DIAGNOSIS — E669 Obesity, unspecified: Secondary | ICD-10-CM | POA: Diagnosis present

## 2022-12-31 DIAGNOSIS — I428 Other cardiomyopathies: Secondary | ICD-10-CM | POA: Diagnosis present

## 2022-12-31 DIAGNOSIS — I493 Ventricular premature depolarization: Secondary | ICD-10-CM | POA: Insufficient documentation

## 2022-12-31 DIAGNOSIS — I130 Hypertensive heart and chronic kidney disease with heart failure and stage 1 through stage 4 chronic kidney disease, or unspecified chronic kidney disease: Secondary | ICD-10-CM | POA: Diagnosis present

## 2022-12-31 DIAGNOSIS — I5020 Unspecified systolic (congestive) heart failure: Secondary | ICD-10-CM | POA: Insufficient documentation

## 2022-12-31 DIAGNOSIS — Z905 Acquired absence of kidney: Secondary | ICD-10-CM | POA: Insufficient documentation

## 2022-12-31 DIAGNOSIS — I480 Paroxysmal atrial fibrillation: Secondary | ICD-10-CM | POA: Diagnosis present

## 2022-12-31 DIAGNOSIS — S3210XD Unspecified fracture of sacrum, subsequent encounter for fracture with routine healing: Secondary | ICD-10-CM | POA: Diagnosis not present

## 2022-12-31 DIAGNOSIS — S32039D Unspecified fracture of third lumbar vertebra, subsequent encounter for fracture with routine healing: Secondary | ICD-10-CM | POA: Diagnosis not present

## 2022-12-31 DIAGNOSIS — F03A0 Unspecified dementia, mild, without behavioral disturbance, psychotic disturbance, mood disturbance, and anxiety: Secondary | ICD-10-CM | POA: Diagnosis present

## 2022-12-31 DIAGNOSIS — W1830XA Fall on same level, unspecified, initial encounter: Secondary | ICD-10-CM | POA: Diagnosis present

## 2022-12-31 DIAGNOSIS — I10 Essential (primary) hypertension: Secondary | ICD-10-CM | POA: Diagnosis not present

## 2022-12-31 DIAGNOSIS — Z9884 Bariatric surgery status: Secondary | ICD-10-CM | POA: Insufficient documentation

## 2022-12-31 DIAGNOSIS — D539 Nutritional anemia, unspecified: Secondary | ICD-10-CM | POA: Diagnosis present

## 2022-12-31 DIAGNOSIS — Z79899 Other long term (current) drug therapy: Secondary | ICD-10-CM | POA: Insufficient documentation

## 2022-12-31 DIAGNOSIS — Z6834 Body mass index (BMI) 34.0-34.9, adult: Secondary | ICD-10-CM | POA: Diagnosis not present

## 2022-12-31 DIAGNOSIS — R41841 Cognitive communication deficit: Secondary | ICD-10-CM | POA: Diagnosis not present

## 2022-12-31 DIAGNOSIS — R293 Abnormal posture: Secondary | ICD-10-CM | POA: Diagnosis not present

## 2022-12-31 DIAGNOSIS — I2510 Atherosclerotic heart disease of native coronary artery without angina pectoris: Secondary | ICD-10-CM | POA: Insufficient documentation

## 2022-12-31 DIAGNOSIS — M25552 Pain in left hip: Secondary | ICD-10-CM | POA: Insufficient documentation

## 2022-12-31 DIAGNOSIS — R531 Weakness: Secondary | ICD-10-CM | POA: Diagnosis not present

## 2022-12-31 DIAGNOSIS — N1832 Chronic kidney disease, stage 3b: Secondary | ICD-10-CM | POA: Diagnosis present

## 2022-12-31 DIAGNOSIS — M48061 Spinal stenosis, lumbar region without neurogenic claudication: Secondary | ICD-10-CM | POA: Diagnosis not present

## 2022-12-31 DIAGNOSIS — R2689 Other abnormalities of gait and mobility: Secondary | ICD-10-CM | POA: Diagnosis not present

## 2022-12-31 DIAGNOSIS — I5042 Chronic combined systolic (congestive) and diastolic (congestive) heart failure: Secondary | ICD-10-CM | POA: Diagnosis present

## 2022-12-31 DIAGNOSIS — Y9301 Activity, walking, marching and hiking: Secondary | ICD-10-CM | POA: Diagnosis present

## 2022-12-31 DIAGNOSIS — F909 Attention-deficit hyperactivity disorder, unspecified type: Secondary | ICD-10-CM | POA: Diagnosis present

## 2022-12-31 DIAGNOSIS — M549 Dorsalgia, unspecified: Secondary | ICD-10-CM | POA: Diagnosis not present

## 2022-12-31 DIAGNOSIS — E8721 Acute metabolic acidosis: Secondary | ICD-10-CM | POA: Diagnosis not present

## 2022-12-31 DIAGNOSIS — Z993 Dependence on wheelchair: Secondary | ICD-10-CM | POA: Insufficient documentation

## 2022-12-31 DIAGNOSIS — M6281 Muscle weakness (generalized): Secondary | ICD-10-CM | POA: Diagnosis not present

## 2022-12-31 DIAGNOSIS — M4807 Spinal stenosis, lumbosacral region: Secondary | ICD-10-CM | POA: Diagnosis not present

## 2022-12-31 DIAGNOSIS — S32049D Unspecified fracture of fourth lumbar vertebra, subsequent encounter for fracture with routine healing: Secondary | ICD-10-CM | POA: Diagnosis not present

## 2022-12-31 DIAGNOSIS — E538 Deficiency of other specified B group vitamins: Secondary | ICD-10-CM | POA: Diagnosis present

## 2022-12-31 DIAGNOSIS — F05 Delirium due to known physiological condition: Secondary | ICD-10-CM | POA: Diagnosis not present

## 2022-12-31 DIAGNOSIS — E559 Vitamin D deficiency, unspecified: Secondary | ICD-10-CM | POA: Diagnosis not present

## 2022-12-31 DIAGNOSIS — D696 Thrombocytopenia, unspecified: Secondary | ICD-10-CM | POA: Diagnosis not present

## 2022-12-31 DIAGNOSIS — N179 Acute kidney failure, unspecified: Secondary | ICD-10-CM | POA: Diagnosis present

## 2022-12-31 DIAGNOSIS — W19XXXA Unspecified fall, initial encounter: Secondary | ICD-10-CM | POA: Diagnosis not present

## 2022-12-31 DIAGNOSIS — F419 Anxiety disorder, unspecified: Secondary | ICD-10-CM | POA: Diagnosis present

## 2022-12-31 DIAGNOSIS — E440 Moderate protein-calorie malnutrition: Secondary | ICD-10-CM | POA: Diagnosis present

## 2022-12-31 DIAGNOSIS — E860 Dehydration: Secondary | ICD-10-CM | POA: Diagnosis present

## 2022-12-31 DIAGNOSIS — M8588 Other specified disorders of bone density and structure, other site: Secondary | ICD-10-CM | POA: Diagnosis not present

## 2022-12-31 DIAGNOSIS — I4891 Unspecified atrial fibrillation: Secondary | ICD-10-CM | POA: Diagnosis not present

## 2022-12-31 DIAGNOSIS — M545 Low back pain, unspecified: Secondary | ICD-10-CM | POA: Diagnosis not present

## 2022-12-31 DIAGNOSIS — Z7401 Bed confinement status: Secondary | ICD-10-CM | POA: Diagnosis not present

## 2022-12-31 DIAGNOSIS — W19XXXD Unspecified fall, subsequent encounter: Secondary | ICD-10-CM | POA: Diagnosis not present

## 2022-12-31 DIAGNOSIS — S3210XA Unspecified fracture of sacrum, initial encounter for closed fracture: Secondary | ICD-10-CM | POA: Diagnosis present

## 2022-12-31 DIAGNOSIS — E876 Hypokalemia: Secondary | ICD-10-CM | POA: Diagnosis not present

## 2022-12-31 LAB — BASIC METABOLIC PANEL
Anion gap: 12 (ref 5–15)
BUN: 52 mg/dL — ABNORMAL HIGH (ref 8–23)
CO2: 21 mmol/L — ABNORMAL LOW (ref 22–32)
Calcium: 9 mg/dL (ref 8.9–10.3)
Chloride: 100 mmol/L (ref 98–111)
Creatinine, Ser: 2.21 mg/dL — ABNORMAL HIGH (ref 0.44–1.00)
GFR, Estimated: 23 mL/min — ABNORMAL LOW (ref >60)
Glucose, Bld: 86 mg/dL (ref 70–99)
Potassium: 5.3 mmol/L — ABNORMAL HIGH (ref 3.5–5.1)
Sodium: 133 mmol/L — ABNORMAL LOW (ref 135–145)

## 2022-12-31 LAB — BRAIN NATRIURETIC PEPTIDE: B Natriuretic Peptide: 704 pg/mL — ABNORMAL HIGH (ref 0.0–100.0)

## 2022-12-31 MED ORDER — DAPAGLIFLOZIN PROPANEDIOL 10 MG PO TABS: 10.0000 mg | ORAL_TABLET | Freq: Every day | ORAL | 0 refills | Status: DC

## 2022-12-31 NOTE — Progress Notes (Signed)
Medication Samples have been provided to the patient.  Drug name: Marcelline Deist       Strength: 10 mg        Qty: 4  LOT: [id number]  Exp.Date: 05/21/2025  Dosing instructions: Take 1 tablet daily  The patient has been instructed regarding the correct time, dose, and frequency of taking this medication, including desired effects and most common side effects.   Leslie [name] 1:48 PM 12/31/2022

## 2022-12-31 NOTE — Patient Instructions (Addendum)
START Farxiga 10 mg daily.  Labs done today, your results will be available in MyChart, we will contact you for abnormal readings.  Your physician recommends that you schedule a follow-up appointment in: 2 months    If you have any questions or concerns before your next appointment please send [place] a message through [place] or call our office at [phone].    TO LEAVE A MESSAGE FOR THE NURSE SELECT OPTION 2, PLEASE LEAVE A MESSAGE INCLUDING: YOUR NAME DATE OF BIRTH CALL BACK NUMBER REASON FOR CALL**this is important as we prioritize the call backs  YOU WILL RECEIVE A CALL BACK THE SAME DAY AS LONG AS YOU CALL BEFORE 4:00 PM  At the [facility], you and your health needs are our priority. As part of our continuing mission to provide you with exceptional heart care, we have created designated Provider Care Teams. These Care Teams include your primary Cardiologist (physician) and Advanced Practice Providers (APPs- Physician Assistants and Nurse Practitioners) who all work together to provide you with the care you need, when you need it.   You may see any of the following providers on your designated Care Team at your next follow up: Dr Arvilla Meres Dr Marca Ancona Dr. Marcos Eke, NP Robbie Lis, [place] [facility] [place], [place] Brynda Peon, NP Karle Plumber, PharmD   Please be sure to bring in all your medications bottles to every appointment.    Thank you for choosing [facility]

## 2022-12-31 NOTE — Progress Notes (Signed)
ADVANCED HEART FAILURE CLINIC NOTE  Referring Physician: Philip Aspen, Almira Bar*  Primary Care: Philip Aspen, Limmie Patricia, MD Primary Cardiologist: Dietrich Pates  HPI: Leslie [name] is a 72 y.o. female with heart failure with reduced ejection fraction, atrial fibrillation, history of obesity status post gastric bypass, history of nephrectomy presenting today to establish care.  Her cardiac history dates back to February 2024 when she was admitted to [facility] for dyspnea; echocardiogram at that time demonstrated an EF of 25 to 30% with global hypokinesis.  She was started on low-dose GDMT at that time.  She also had a 7-day Zio patch that captured a A-fib burden of 36%.  She underwent coronary CTA in March 2024 that showed only mild CAD.  Since that time she has been seen in [facility] clinic where GDMT was further uptitrated.  Interval history: - Since uptitrating GDMT she has had significant improvement in symptoms associated with HFrEF; she has minimal LE edema, PND, orthopnea. Her only complaints today are leg pain in the hips for which she requires a wheelchair at times.   Activity level/exercise tolerance: NYHA IIb Orthopnea:  Sleeps on 2 pillows Paroxysmal noctural dyspnea: No Chest pain/pressure: No Orthostatic lightheadedness:  no Palpitations:  no Lower extremity edema: Infrequent, takes Lasix as needed for 2 to 3 pound weight gain and lower extremity edema Presyncope/syncope:  no Cough:  no  Past Medical History:  Diagnosis Date   ADD (attention deficit disorder)    Allergy    Anemia    Arthritis 07/22/2014   osteoarthritis left knee   Colon polyps    Hypertension    Left knee pain    chronic   Narcotic addiction (HCC)    Obesity    post gastric bypass   Vitamin D deficiency     Current Outpatient Medications  Medication Sig Dispense Refill   ALPRAZolam (XANAX) 0.25 MG tablet TAKE 1 TABLET(0.25 MG) BY MOUTH AT BEDTIME AS NEEDED FOR ANXIETY 30 tablet 1    amiodarone (PACERONE) 200 MG tablet Take 200 mg by mouth daily.     amphetamine-dextroamphetamine (ADDERALL XR) 30 MG 24 hr capsule Take 1 capsule (30 mg total) by mouth every morning. 30 capsule 0   amphetamine-dextroamphetamine (ADDERALL) 20 MG tablet Take 1 tablet (20 mg total) by mouth daily. 30 tablet 0   apixaban (ELIQUIS) 5 MG TABS tablet Take 1 tablet (5 mg total) by mouth 2 (two) times daily. 180 tablet 3   atorvastatin (LIPITOR) 40 MG tablet Take 1 tablet (40 mg total) by mouth daily. 90 tablet 3   Cholecalciferol (VITAMIN D3) 50 MCG (2000 UT) TABS Take 2,000 Units by mouth daily in the afternoon.     FLUoxetine (PROZAC) 40 MG capsule TAKE 1 CAPSULE(40 MG) BY MOUTH EVERY MORNING 90 capsule 1   furosemide (LASIX) 40 MG tablet Take 20 mg by mouth as needed. Take one half (0.5) tablet by mouth ( 20 mg) as needed for wt gain of 3 lbs in 24 hours or 5 lbs in one week.     lamoTRIgine (LAMICTAL) 100 MG tablet TAKE 1 TABLET(100 MG) BY MOUTH TWICE DAILY 180 tablet 1   losartan (COZAAR) 25 MG tablet Take 0.5 tablets (12.5 mg total) by mouth daily. 45 tablet 3   metoprolol succinate (TOPROL-XL) 50 MG 24 hr tablet Take 1 tablet (50 mg total) by mouth at bedtime. Take with or immediately following a meal. 90 tablet 3   Multiple Vitamins-Minerals (PRESERVISION AREDS PO) Take 1 tablet  by mouth 2 (two) times daily.     potassium chloride (KLOR-CON) 10 MEQ tablet Take 10 mEq by mouth daily.     spironolactone (ALDACTONE) 25 MG tablet Take 0.5 tablets (12.5 mg total) by mouth daily. 30 tablet 5   zolpidem (AMBIEN) 10 MG tablet TAKE 1 TABLET(10 MG) BY MOUTH AT BEDTIME 30 tablet 0   No current facility-administered medications for this encounter.    Allergies  Allergen Reactions   Amoxicillin     Hives   Sulfamethoxazole Nausea And Vomiting    See patient list of med intolerances due to h/o gastric bypass and nephrectomy      Social History   Socioeconomic History   Marital status: Widowed     Spouse name: Not on file   Number of children: 2   Years of education: Not on file   Highest education level: Associate degree: academic program  Occupational History   Occupation: Retired  Tobacco Use   Smoking status: Former    Types: Cigarettes    Quit date: 07/22/1992    Years since quitting: 30.4   Smokeless tobacco: Never  Vaping Use   Vaping Use: Never used  Substance and Sexual Activity   Alcohol use: Yes    Comment: wine weekly   Drug use: No   Sexual activity: Not on file  Other Topics Concern   Not on file  Social History Narrative   Not on file   Social Determinants of Health   Financial Resource Strain: Low Risk  (09/20/2022)   Overall Financial Resource Strain (CARDIA)    Difficulty of Paying Living Expenses: Not very hard  Food Insecurity: No Food Insecurity (09/24/2022)   Hunger Vital Sign    Worried About Running Out of Food in the Last Year: Never true    Ran Out of Food in the Last Year: Never true  Transportation Needs: No Transportation Needs (09/24/2022)   PRAPARE - Administrator, Civil Service (Medical): No    Lack of Transportation (Non-Medical): No  Physical Activity: Unknown (10/04/2021)   Exercise Vital Sign    Days of Exercise per Week: 0 days    Minutes of Exercise per Session: Not on file  Stress: Not on file  Social Connections: Unknown (10/04/2021)   Social Connection and Isolation Panel [NHANES]    Frequency of Communication with Friends and Family: Not on file    Frequency of Social Gatherings with Friends and Family: Once a week    Attends Religious Services: More than 4 times per year    Active Member of [employer] or Organizations: Not on file    Attends Banker Meetings: Not on file    Marital Status: Widowed  Intimate Partner Violence: Not At Risk (09/19/2022)   Humiliation, Afraid, Rape, and Kick questionnaire    Fear of Current or Ex-Partner: No    Emotionally Abused: No    Physically Abused: No    Sexually  Abused: No      Family History  Problem Relation Age of Onset   Heart disease Mother        post CABG history of CHF   COPD Father    Diabetes Sister    Hypertension Sister        post renal transplant   Heart disease Brother        CAD    PHYSICAL EXAM: Vitals:   12/31/22 1212  BP: 138/88   GENERAL: In wheelchair today HEENT: Negative for arcus  senilis or xanthelasma. There is no scleral icterus.  The mucous membranes are pink and moist.   NECK: Supple, No masses. Normal carotid upstrokes without bruits. No masses or thyromegaly.    CHEST: There are no chest wall deformities. There is no chest wall tenderness. Respirations are unlabored.  Lungs-CTA bilaterally CARDIAC:  JVP: 7 cm          Normal rate with regular rhythm. No murmurs, rubs or gallops.  Pulses are 2+ and symmetrical in upper and lower extremities.  No edema.  ABDOMEN: Soft, non-tender, non-distended. There are no masses or hepatomegaly. There are normal bowel sounds.  EXTREMITIES: Warm and well perfused with no cyanosis, clubbing.  LYMPHATIC: No axillary or supraclavicular lymphadenopathy.  NEUROLOGIC: Patient is oriented x3 with no focal or lateralizing neurologic deficits.  PSYCH: Patients affect is appropriate, there is no evidence of anxiety or depression.  SKIN: Warm and dry; no lesions or wounds.    DATA REVIEW  ECG: 11/15/2022: Normal sinus rhythm as per my personal interpretation  ECHO: 08/29/22: LVEF 25%, moderate MR, moderate AI.  As per my personal interpretation 12/19/22: LVEF 45%, mild AI as per my interpretation  CATH: Coronary CTA with mild nonobstructive CAD.   ASSESSMENT & PLAN:  Heart failure with reduced ejection fraction Etiology of HF: Nonischemic cardiomyopathy; coronary CTA from March 2024 with only mild CAD; possibly secondary to tachyarrhythmia.  Will cancel MRI as she has had a significant improvement in LVEF NYHA class / AHA Stage:NYHA II Volume status & Diuretics: euvolemic,  taking lasix 40mg  daily.  Vasodilators: increase losartan to 25mg  daily.  Beta-Blocker: Continue metoprolol succinate 50 mg nightly [id number] spironolactone 12.5 mg daily, repeat labs today Cardiometabolic: start farxiga 10mg  Devices therapies & Valvulopathies: Not indicated Advanced therapies: Not indicated  2. Paroxysmal atrial fibrillation -Plan for atrial fibrillation ablation with Dr. Nelly Laurence in September 2024  3. Nonobstructive coronary artery disease -Continue Lipitor 10 mg daily.  Followed by Dr. Dietrich Pates  4.  PVCs -Continue amiodarone 200 mg daily.  7.8% burden on 7-day Zio patch.  Following with Dr. Nelly Laurence for A-fib and possibly PVC ablation.  5.  Nephrectomy -Serum creatinine stable. -Increase losartan to 25 mg.  Start Farxiga 10 mg.  Leslie [name] Advanced Heart Failure Mechanical Circulatory Support

## 2023-01-02 ENCOUNTER — Ambulatory Visit (HOSPITAL_BASED_OUTPATIENT_CLINIC_OR_DEPARTMENT_OTHER): Payer: Medicare PPO | Admitting: Student

## 2023-01-02 ENCOUNTER — Emergency Department (HOSPITAL_COMMUNITY): Payer: Medicare PPO

## 2023-01-02 ENCOUNTER — Encounter (HOSPITAL_COMMUNITY): Payer: Self-pay | Admitting: Emergency Medicine

## 2023-01-02 ENCOUNTER — Other Ambulatory Visit: Payer: Self-pay

## 2023-01-02 ENCOUNTER — Inpatient Hospital Stay (HOSPITAL_COMMUNITY)
Admission: EM | Admit: 2023-01-02 | Discharge: 2023-01-09 | DRG: 516 | Disposition: A | Discharge status: Skilled Nursing Facility | Payer: Medicare PPO | Attending: Internal Medicine | Admitting: Internal Medicine

## 2023-01-02 ENCOUNTER — Observation Stay (HOSPITAL_COMMUNITY): Payer: Medicare PPO

## 2023-01-02 DIAGNOSIS — F419 Anxiety disorder, unspecified: Secondary | ICD-10-CM | POA: Diagnosis present

## 2023-01-02 DIAGNOSIS — Z7901 Long term (current) use of anticoagulants: Secondary | ICD-10-CM

## 2023-01-02 DIAGNOSIS — Z961 Presence of intraocular lens: Secondary | ICD-10-CM | POA: Diagnosis present

## 2023-01-02 DIAGNOSIS — S32119A Unspecified Zone I fracture of sacrum, initial encounter for closed fracture: Principal | ICD-10-CM | POA: Diagnosis present

## 2023-01-02 DIAGNOSIS — F03A0 Unspecified dementia, mild, without behavioral disturbance, psychotic disturbance, mood disturbance, and anxiety: Secondary | ICD-10-CM | POA: Diagnosis present

## 2023-01-02 DIAGNOSIS — I5022 Chronic systolic (congestive) heart failure: Secondary | ICD-10-CM

## 2023-01-02 DIAGNOSIS — E559 Vitamin D deficiency, unspecified: Secondary | ICD-10-CM | POA: Diagnosis present

## 2023-01-02 DIAGNOSIS — E785 Hyperlipidemia, unspecified: Secondary | ICD-10-CM | POA: Diagnosis present

## 2023-01-02 DIAGNOSIS — M533 Sacrococcygeal disorders, not elsewhere classified: Secondary | ICD-10-CM | POA: Diagnosis present

## 2023-01-02 DIAGNOSIS — Z905 Acquired absence of kidney: Secondary | ICD-10-CM

## 2023-01-02 DIAGNOSIS — Z825 Family history of asthma and other chronic lower respiratory diseases: Secondary | ICD-10-CM

## 2023-01-02 DIAGNOSIS — Z79899 Other long term (current) drug therapy: Secondary | ICD-10-CM

## 2023-01-02 DIAGNOSIS — F909 Attention-deficit hyperactivity disorder, unspecified type: Secondary | ICD-10-CM | POA: Diagnosis present

## 2023-01-02 DIAGNOSIS — Z8249 Family history of ischemic heart disease and other diseases of the circulatory system: Secondary | ICD-10-CM

## 2023-01-02 DIAGNOSIS — S3210XA Unspecified fracture of sacrum, initial encounter for closed fracture: Secondary | ICD-10-CM | POA: Diagnosis not present

## 2023-01-02 DIAGNOSIS — I130 Hypertensive heart and chronic kidney disease with heart failure and stage 1 through stage 4 chronic kidney disease, or unspecified chronic kidney disease: Secondary | ICD-10-CM | POA: Diagnosis present

## 2023-01-02 DIAGNOSIS — F32A Depression, unspecified: Secondary | ICD-10-CM | POA: Diagnosis present

## 2023-01-02 DIAGNOSIS — Z880 Allergy status to penicillin: Secondary | ICD-10-CM

## 2023-01-02 DIAGNOSIS — N179 Acute kidney failure, unspecified: Secondary | ICD-10-CM | POA: Diagnosis not present

## 2023-01-02 DIAGNOSIS — E669 Obesity, unspecified: Secondary | ICD-10-CM | POA: Diagnosis present

## 2023-01-02 DIAGNOSIS — Z9884 Bariatric surgery status: Secondary | ICD-10-CM | POA: Diagnosis not present

## 2023-01-02 DIAGNOSIS — E871 Hypo-osmolality and hyponatremia: Secondary | ICD-10-CM | POA: Diagnosis not present

## 2023-01-02 DIAGNOSIS — M8588 Other specified disorders of bone density and structure, other site: Secondary | ICD-10-CM | POA: Diagnosis present

## 2023-01-02 DIAGNOSIS — D696 Thrombocytopenia, unspecified: Secondary | ICD-10-CM | POA: Diagnosis not present

## 2023-01-02 DIAGNOSIS — M4807 Spinal stenosis, lumbosacral region: Secondary | ICD-10-CM | POA: Diagnosis present

## 2023-01-02 DIAGNOSIS — W1830XA Fall on same level, unspecified, initial encounter: Secondary | ICD-10-CM | POA: Diagnosis present

## 2023-01-02 DIAGNOSIS — G479 Sleep disorder, unspecified: Secondary | ICD-10-CM

## 2023-01-02 DIAGNOSIS — F05 Delirium due to known physiological condition: Secondary | ICD-10-CM | POA: Diagnosis not present

## 2023-01-02 DIAGNOSIS — Y9301 Activity, walking, marching and hiking: Secondary | ICD-10-CM | POA: Diagnosis present

## 2023-01-02 DIAGNOSIS — E8721 Acute metabolic acidosis: Secondary | ICD-10-CM | POA: Diagnosis not present

## 2023-01-02 DIAGNOSIS — I480 Paroxysmal atrial fibrillation: Secondary | ICD-10-CM | POA: Diagnosis present

## 2023-01-02 DIAGNOSIS — E440 Moderate protein-calorie malnutrition: Secondary | ICD-10-CM | POA: Diagnosis present

## 2023-01-02 DIAGNOSIS — E538 Deficiency of other specified B group vitamins: Secondary | ICD-10-CM | POA: Diagnosis present

## 2023-01-02 DIAGNOSIS — Z6834 Body mass index (BMI) 34.0-34.9, adult: Secondary | ICD-10-CM

## 2023-01-02 DIAGNOSIS — E876 Hypokalemia: Secondary | ICD-10-CM | POA: Diagnosis not present

## 2023-01-02 DIAGNOSIS — D539 Nutritional anemia, unspecified: Secondary | ICD-10-CM | POA: Diagnosis present

## 2023-01-02 DIAGNOSIS — N1832 Chronic kidney disease, stage 3b: Secondary | ICD-10-CM | POA: Diagnosis present

## 2023-01-02 DIAGNOSIS — I10 Essential (primary) hypertension: Secondary | ICD-10-CM | POA: Diagnosis present

## 2023-01-02 DIAGNOSIS — M549 Dorsalgia, unspecified: Secondary | ICD-10-CM | POA: Diagnosis not present

## 2023-01-02 DIAGNOSIS — Z833 Family history of diabetes mellitus: Secondary | ICD-10-CM

## 2023-01-02 DIAGNOSIS — Z96653 Presence of artificial knee joint, bilateral: Secondary | ICD-10-CM | POA: Diagnosis present

## 2023-01-02 DIAGNOSIS — Z87891 Personal history of nicotine dependence: Secondary | ICD-10-CM

## 2023-01-02 DIAGNOSIS — I493 Ventricular premature depolarization: Secondary | ICD-10-CM | POA: Diagnosis present

## 2023-01-02 DIAGNOSIS — I428 Other cardiomyopathies: Secondary | ICD-10-CM | POA: Diagnosis present

## 2023-01-02 DIAGNOSIS — E860 Dehydration: Secondary | ICD-10-CM | POA: Diagnosis present

## 2023-01-02 DIAGNOSIS — I2510 Atherosclerotic heart disease of native coronary artery without angina pectoris: Secondary | ICD-10-CM | POA: Diagnosis present

## 2023-01-02 DIAGNOSIS — Z882 Allergy status to sulfonamides status: Secondary | ICD-10-CM

## 2023-01-02 DIAGNOSIS — I5042 Chronic combined systolic (congestive) and diastolic (congestive) heart failure: Secondary | ICD-10-CM | POA: Diagnosis present

## 2023-01-02 DIAGNOSIS — M48061 Spinal stenosis, lumbar region without neurogenic claudication: Secondary | ICD-10-CM | POA: Diagnosis present

## 2023-01-02 LAB — BASIC METABOLIC PANEL
Anion gap: 9 (ref 5–15)
BUN: 37 mg/dL — ABNORMAL HIGH (ref 8–23)
CO2: 19 mmol/L — ABNORMAL LOW (ref 22–32)
Calcium: 8.6 mg/dL — ABNORMAL LOW (ref 8.9–10.3)
Chloride: 102 mmol/L (ref 98–111)
Creatinine, Ser: 1.5 mg/dL — ABNORMAL HIGH (ref 0.44–1.00)
GFR, Estimated: 37 mL/min — ABNORMAL LOW (ref >60)
Glucose, Bld: 93 mg/dL (ref 70–99)
Potassium: 3.8 mmol/L (ref 3.5–5.1)
Sodium: 130 mmol/L — ABNORMAL LOW (ref 135–145)

## 2023-01-02 LAB — CBC WITH DIFFERENTIAL/PLATELET
Abs Immature Granulocytes: 0.04 10*3/uL (ref 0.00–0.07)
Basophils Absolute: 0 10*3/uL (ref 0.0–0.1)
Basophils Relative: 1 %
Eosinophils Absolute: 0.1 10*3/uL (ref 0.0–0.5)
Eosinophils Relative: 2 %
HCT: 32.2 % — ABNORMAL LOW (ref 36.0–46.0)
Hemoglobin: 10.3 g/dL — ABNORMAL LOW (ref 12.0–15.0)
Immature Granulocytes: 1 %
Lymphocytes Relative: 6 %
Lymphs Abs: 0.3 10*3/uL — ABNORMAL LOW (ref 0.7–4.0)
MCH: 33.2 pg (ref 26.0–34.0)
MCHC: 32 g/dL (ref 30.0–36.0)
MCV: 103.9 fL — ABNORMAL HIGH (ref 80.0–100.0)
Monocytes Absolute: 0.3 10*3/uL (ref 0.1–1.0)
Monocytes Relative: 5 %
Neutro Abs: 4.7 10*3/uL (ref 1.7–7.7)
Neutrophils Relative %: 85 %
Platelets: 148 10*3/uL — ABNORMAL LOW (ref 150–400)
RBC: 3.1 MIL/uL — ABNORMAL LOW (ref 3.87–5.11)
RDW: 12.6 % (ref 11.5–15.5)
WBC: 5.5 10*3/uL (ref 4.0–10.5)
nRBC: 0 % (ref 0.0–0.2)

## 2023-01-02 LAB — VITAMIN B12: Vitamin B-12: 176 pg/mL — ABNORMAL LOW (ref 180–914)

## 2023-01-02 MED ORDER — AMIODARONE HCL 200 MG PO TABS
200.0000 mg | ORAL_TABLET | Freq: Every day | ORAL | Status: DC
  Administered 2023-01-03 – 2023-01-09 (×7): 200 mg via ORAL
  Filled 2023-01-02 (×7): qty 1

## 2023-01-02 MED ORDER — ADULT MULTIVITAMIN W/MINERALS CH
1.0000 | ORAL_TABLET | Freq: Every day | ORAL | Status: DC
  Administered 2023-01-02 – 2023-01-09 (×7): 1 via ORAL
  Filled 2023-01-02 (×7): qty 1

## 2023-01-02 MED ORDER — LAMOTRIGINE 100 MG PO TABS
100.0000 mg | ORAL_TABLET | Freq: Two times a day (BID) | ORAL | Status: DC
  Administered 2023-01-02 – 2023-01-09 (×14): 100 mg via ORAL
  Filled 2023-01-02 (×14): qty 1

## 2023-01-02 MED ORDER — ALPRAZOLAM 0.25 MG PO TABS
0.2500 mg | ORAL_TABLET | Freq: Every day | ORAL | Status: DC
  Administered 2023-01-02 – 2023-01-08 (×7): 0.25 mg via ORAL
  Filled 2023-01-02 (×7): qty 1

## 2023-01-02 MED ORDER — HYDROCODONE-ACETAMINOPHEN 5-325 MG PO TABS
1.0000 | ORAL_TABLET | Freq: Once | ORAL | Status: AC
  Administered 2023-01-02: 1 via ORAL
  Filled 2023-01-02: qty 1

## 2023-01-02 MED ORDER — FOLIC ACID 1 MG PO TABS
1.0000 mg | ORAL_TABLET | Freq: Every day | ORAL | Status: DC
  Administered 2023-01-02 – 2023-01-09 (×7): 1 mg via ORAL
  Filled 2023-01-02 (×7): qty 1

## 2023-01-02 MED ORDER — APIXABAN 5 MG PO TABS
5.0000 mg | ORAL_TABLET | Freq: Two times a day (BID) | ORAL | Status: DC
  Administered 2023-01-02 – 2023-01-03 (×2): 5 mg via ORAL
  Filled 2023-01-02 (×2): qty 1

## 2023-01-02 MED ORDER — HYDROMORPHONE HCL 1 MG/ML IJ SOLN
0.5000 mg | INTRAMUSCULAR | Status: DC | PRN
  Administered 2023-01-02 – 2023-01-05 (×10): 0.5 mg via INTRAVENOUS
  Filled 2023-01-02 (×11): qty 0.5

## 2023-01-02 MED ORDER — MORPHINE SULFATE (PF) 4 MG/ML IV SOLN
4.0000 mg | Freq: Once | INTRAVENOUS | Status: AC
  Administered 2023-01-02: 4 mg via INTRAVENOUS
  Filled 2023-01-02: qty 1

## 2023-01-02 MED ORDER — ACETAMINOPHEN 325 MG PO TABS
650.0000 mg | ORAL_TABLET | Freq: Four times a day (QID) | ORAL | Status: DC
  Administered 2023-01-02 – 2023-01-03 (×4): 650 mg via ORAL
  Filled 2023-01-02 (×7): qty 2

## 2023-01-02 MED ORDER — ATORVASTATIN CALCIUM 40 MG PO TABS
40.0000 mg | ORAL_TABLET | Freq: Every day | ORAL | Status: DC
  Administered 2023-01-03 – 2023-01-09 (×7): 40 mg via ORAL
  Filled 2023-01-02 (×7): qty 1

## 2023-01-02 MED ORDER — FLUOXETINE HCL 20 MG PO CAPS
40.0000 mg | ORAL_CAPSULE | Freq: Every morning | ORAL | Status: DC
  Administered 2023-01-03 – 2023-01-09 (×7): 40 mg via ORAL
  Filled 2023-01-02 (×7): qty 2

## 2023-01-02 MED ORDER — TRAMADOL HCL 50 MG PO TABS
50.0000 mg | ORAL_TABLET | Freq: Once | ORAL | Status: AC
  Administered 2023-01-02: 50 mg via ORAL
  Filled 2023-01-02: qty 1

## 2023-01-02 MED ORDER — SODIUM CHLORIDE 0.9 % IV SOLN: Freq: Once | INTRAVENOUS | Status: AC

## 2023-01-02 MED ORDER — FUROSEMIDE 40 MG PO TABS
40.0000 mg | ORAL_TABLET | Freq: Every day | ORAL | Status: DC
  Administered 2023-01-03 – 2023-01-09 (×7): 40 mg via ORAL
  Filled 2023-01-02 (×7): qty 1

## 2023-01-02 MED ORDER — DAPAGLIFLOZIN PROPANEDIOL 10 MG PO TABS
10.0000 mg | ORAL_TABLET | Freq: Every day | ORAL | Status: DC
  Administered 2023-01-03 – 2023-01-09 (×7): 10 mg via ORAL
  Filled 2023-01-02 (×7): qty 1

## 2023-01-02 MED ORDER — THIAMINE MONONITRATE 100 MG PO TABS
100.0000 mg | ORAL_TABLET | Freq: Every day | ORAL | Status: DC
  Administered 2023-01-02 – 2023-01-09 (×7): 100 mg via ORAL
  Filled 2023-01-02 (×7): qty 1

## 2023-01-02 MED ORDER — CYANOCOBALAMIN 1000 MCG/ML IJ SOLN
1000.0000 ug | Freq: Once | INTRAMUSCULAR | Status: AC
  Administered 2023-01-02: 1000 ug via INTRAMUSCULAR
  Filled 2023-01-02: qty 1

## 2023-01-02 MED ORDER — HYDROCODONE-ACETAMINOPHEN 5-325 MG PO TABS
1.0000 | ORAL_TABLET | Freq: Four times a day (QID) | ORAL | Status: DC | PRN
  Administered 2023-01-03 – 2023-01-05 (×9): 2 via ORAL
  Filled 2023-01-02 (×11): qty 2

## 2023-01-02 NOTE — Assessment & Plan Note (Signed)
Patient had a recent follow-up with the CHF clinic at which time her blood work demonstrated an AKI, potentially due to initiation of losartan in the setting of nephrectomy.  Will recheck blood work today and hold home losartan.  - BMP pending - Hold home losartan and spironolactone until results

## 2023-01-02 NOTE — Assessment & Plan Note (Addendum)
Patient is presenting after ground-level fall that was mechanical in nature with CT imaging demonstrated acute mildly displaced fracture through the left S1 transverse process in addition to acute fractures throughout the sacral alla bilaterally.  Neurosurgery has been consulted, likely nonsurgical management.    - Neurosurgery consulted; appreciate their recommendations - MRI of the lumbar spine pending - Tylenol 650 mg every 6 hours - Norco and Dilaudid as needed for pain control - PT/OT evaluation.  Weightbearing as tolerated.

## 2023-01-02 NOTE — ED Provider Notes (Signed)
[place] EMERGENCY DEPARTMENT AT [facility] Provider Note   CSN: [id number] Arrival date & time: 01/02/23  1456     History  Chief Complaint  Patient presents with   Fall   Back Pain    Leslie [name] is a 72 y.o. female.  HPI   72 year old female presents to the  ER after mechanical fall yesterday. States she walked into her kitchen, her shoe got caught and she fell down onto her left hip. Had immediate left hip and lower back pain. Denies head injury or LOC. Has had difficulty walking since. Has chronic BLE weakness ongoing for the past two years, but significantly worse today. Denies any other acute illness.  Home Medications Prior to Admission medications   Medication Sig Start Date End Date Taking? Authorizing Provider  ALPRAZolam (XANAX) 0.25 MG tablet TAKE 1 TABLET(0.25 MG) BY MOUTH AT BEDTIME AS NEEDED FOR ANXIETY 12/23/22   Philip Aspen, Limmie Patricia, MD  amiodarone (PACERONE) 200 MG tablet Take 200 mg by mouth daily.    [provider]  amphetamine-dextroamphetamine (ADDERALL XR) 30 MG 24 hr capsule Take 1 capsule (30 mg total) by mouth every morning. 10/07/22   Philip Aspen, Limmie Patricia, MD  amphetamine-dextroamphetamine (ADDERALL) 20 MG tablet Take 1 tablet (20 mg total) by mouth daily. 10/07/22   Philip Aspen, Limmie Patricia, MD  apixaban (ELIQUIS) 5 MG TABS tablet Take 1 tablet (5 mg total) by mouth 2 (two) times daily. 09/09/22   Pricilla Riffle, MD  atorvastatin (LIPITOR) 40 MG tablet Take 1 tablet (40 mg total) by mouth daily. 12/17/22   Pricilla Riffle, MD  Cholecalciferol (VITAMIN D3) 50 MCG (2000 UT) TABS Take 2,000 Units by mouth daily in the afternoon.    [provider]  dapagliflozin propanediol (FARXIGA) 10 MG TABS tablet Take 1 tablet (10 mg total) by mouth daily before breakfast. 12/31/22   Sabharwal, Aditya, DO  FLUoxetine (PROZAC) 40 MG capsule TAKE 1 CAPSULE(40 MG) BY MOUTH EVERY MORNING 12/26/22   Philip Aspen, Limmie Patricia, MD   furosemide (LASIX) 40 MG tablet Take 20 mg by mouth as needed. Take one half (0.5) tablet by mouth ( 20 mg) as needed for wt gain of 3 lbs in 24 hours or 5 lbs in one week.    [provider]  lamoTRIgine (LAMICTAL) 100 MG tablet TAKE 1 TABLET(100 MG) BY MOUTH TWICE DAILY 11/13/22   Philip Aspen, Limmie Patricia, MD  losartan (COZAAR) 25 MG tablet Take 0.5 tablets (12.5 mg total) by mouth daily. 11/15/22 02/13/23  Sabharwal, Aditya, DO  metoprolol succinate (TOPROL-XL) 50 MG 24 hr tablet Take 1 tablet (50 mg total) by mouth at bedtime. Take with or immediately following a meal. 11/15/22 02/13/23  Sabharwal, Aditya, DO  potassium chloride (KLOR-CON) 10 MEQ tablet Take 10 mEq by mouth daily. 11/09/22   [provider]  spironolactone (ALDACTONE) 25 MG tablet Take 0.5 tablets (12.5 mg total) by mouth daily. 10/30/22   Robbie Lis M, PA-C  zolpidem (AMBIEN) 10 MG tablet TAKE 1 TABLET(10 MG) BY MOUTH AT BEDTIME 12/31/22   Philip Aspen, Limmie Patricia, MD      Allergies    Amoxicillin and Sulfamethoxazole    Review of Systems   Review of Systems  Constitutional:  Positive for fatigue. Negative for fever.  Respiratory:  Negative for shortness of breath.   Cardiovascular:  Negative for chest pain.  Gastrointestinal:  Negative for abdominal pain, diarrhea and vomiting.  Musculoskeletal:  Positive for  back pain. Negative for neck pain.  Skin:  Negative for rash.  Neurological:  Positive for weakness. Negative for headaches.    Physical Exam Updated Vital Signs BP 127/73   Pulse 88   Temp 97.8 F (36.6 C) (Oral)   Resp 14   SpO2 94%  Physical Exam Vitals and nursing note reviewed.  Constitutional:      Appearance: Normal appearance.  HENT:     Head: Normocephalic.     Mouth/Throat:     Mouth: Mucous membranes are moist.  Cardiovascular:     Rate and Rhythm: Normal rate.  Pulmonary:     Effort: Pulmonary effort is normal. No respiratory distress.  Abdominal:      Palpations: Abdomen is soft.     Tenderness: There is no abdominal tenderness.  Musculoskeletal:     Comments: TTP of the left hip, midline lower lumbar spine and lateral musculature. No significant ecchymosis or hematoma noted  Skin:    General: Skin is warm.  Neurological:     Mental Status: She is alert and oriented to person, place, and time. Mental status is at baseline.  Psychiatric:        Mood and Affect: Mood normal.     ED Results / Procedures / Treatments   Labs (all labs ordered are listed, but only abnormal results are displayed) Labs Reviewed - No data to display  EKG None  Radiology No results found.  Procedures Procedures    Medications Ordered in ED Medications  traMADol (ULTRAM) tablet 50 mg (has no administration in time range)    ED Course/ Medical Decision Making/ A&P                             Medical Decision Making Amount and/or Complexity of Data Reviewed Radiology: ordered.  Risk Prescription drug management. Decision regarding hospitalization.   72 year old female presents emergency department after mechanical fall from ground-level down onto her left hip.  Complaining of left hip and lower back pain.  She denies any loss of consciousness or syncope.  Is anticoagulated.  No head injury or headache.  Imaging is significant for a S1 left transverse process fracture as well as bilateral sacral ala fractures extending to the SI joints.  Consulted with neurosurgery.  Spoke with PA Esperanza Richters under Dr. Jake Samples who recommends pain control and weightbearing as tolerated.  After pain medicine patient attempted to ambulate with a walker and she was unable to weight-bear.  She will require admission for pain control, MRI of the lumbar spine recommended by neurosurgery.  Patients evaluation and results requires admission for further treatment and care.  Spoke with hospitalist, reviewed patient's ED course and they accept admission.  Patient agrees  with admission plan, offers no new complaints and is stable/unchanged at time of admit.        Final Clinical Impression(s) / ED Diagnoses Final diagnoses:  None    Rx / DC Orders ED Discharge Orders     None         Rozelle Logan, DO 01/02/23 2049

## 2023-01-02 NOTE — Assessment & Plan Note (Signed)
Patient appears euvolemic at this time and states that she is within her home baseline weight.  She weighs herself daily and continues to take Lasix daily  - Continue home Lasix, metoprolol and Farxiga - Daily weights - Hold home losartan and spironolactone due to AKI as noted above

## 2023-01-02 NOTE — ED Notes (Signed)
RN assumed care of pt.  Reassessed pain was a 4/10 and she wanted to try to walk.  RN obtained a walker and twice the pt attempted to get out of bed unsuccessfully.  Pt tried to slowly get up but the pain stopped her both times.

## 2023-01-02 NOTE — Assessment & Plan Note (Signed)
Well-controlled at this time.  - Holding home losartan and spironolactone - Continue home metoprolol

## 2023-01-02 NOTE — Plan of Care (Signed)
  Problem: Nutrition: Goal: Adequate nutrition will be maintained Outcome: Progressing   Problem: Pain Managment: Goal: General experience of comfort will improve Outcome: Progressing   

## 2023-01-02 NOTE — ED Triage Notes (Signed)
Patient presents from home with a complaint of back pain which greatly restricts her mobility, He back pain is due to a fall in her kitchen last night in which she landed on her left side. Her family had to help her up. She reports a history of multiple falls, falling again today. Patient reports leg weakness.      EMS vitals: 118/76 BP 54 HR 97% SPO2 on room air

## 2023-01-02 NOTE — Assessment & Plan Note (Signed)
Patient has a history of gastric bypass but has not been taking any multivitamins for many years now.  She is at high risk for multiple vitamin deficiencies including vitamin D deficiency.  She states that she has known vitamin D deficiency, last checked 1 year ago at which time it was 25.  We discussed the importance of initiating daily vitamins.  - Dietitian consulted - Vitamin B12 and D pending

## 2023-01-02 NOTE — H&P (Signed)
History and Physical    Patient: Leslie [name] ZOX:[id number] DOB: 1950/08/09 DOA: 01/02/2023 DOS: the patient was seen and examined on 01/02/2023 PCP: Philip Aspen, Limmie Patricia, MD  Patient coming from: Home  Chief Complaint:  Chief Complaint  Patient presents with   Fall   Back Pain   HPI: Leslie [name] is a 72 y.o. female with medical history significant of HFrEF with EF of 45%, atrial fibrillation on Eliquis, hypertension, obesity s/p gastric bypass, s/p nephrectomy, who presents to the ED due to a ground-level fall.  [name] states that she was walking in her kitchen in the evening of 6/11 when her shoe got caught on the floor and she fell forward landing onto her left hip and knee.  She was able to get up on her own but since then has been experiencing progressively worsening lower back pain that is predominantly on the left, left hip and left knee pain.  She was able to get around her home with the assistance of a walker, but today she was unable to stand up and EMS was called.  Prior to the fall she denies any palpitation, chest pain, shortness of breath or dizziness.  She denies any loss of consciousness or hitting her head.   ED course: On arrival to the ED, patient was normotensive at 127/73 with heart rate of 53.  She was saturating at 94% on room air.  She was afebrile 97.8. CBC and CMP still pending. Hip x-ray was obtained that demonstrated osteopenia with degenerative changes.  CT of the L-spine was obtained that demonstrated acute mildly displaced fracture of the left SY transverse process, acute nondisplaced fractures throughout the sacral bilaterally extending to bilateral SI joints with moderate to severe spinal stenosis.  Neurosurgery was consulted with recommendation to obtain an MRI.  TRH contacted for admission as patient is unable to ambulate independently.  Review of Systems: As mentioned in the history of present illness. All other systems reviewed and are  negative.  Past Medical History:  Diagnosis Date   ADD (attention deficit disorder)    Allergy    Anemia    Arthritis 07/22/2014   osteoarthritis left knee   Colon polyps    Hypertension    Left knee pain    chronic   Narcotic addiction (HCC)    Obesity    post gastric bypass   Vitamin D deficiency    Past Surgical History:  Procedure Laterality Date   arthscopic knee surgery Left    several times   BREAST BIOPSY     BREAST SURGERY  years ago   breast biopsy   CHOLECYSTECTOMY     EYE SURGERY Bilateral 2013   Toric Lens implants   EYE SURGERY Right 2014   corneal amniotic membrane   GASTRIC BYPASS  2003   KIDNEY DONATION  2004   TOTAL KNEE ARTHROPLASTY Left 06/16/2015   Procedure: LEFT TOTAL KNEE ARTHROPLASTY;  Surgeon: Kathryne Hitch, MD;  Location: [facility];  Service: Orthopedics;  Laterality: Left;   TOTAL KNEE ARTHROPLASTY Right 05/04/2021   Procedure: RIGHT TOTAL KNEE ARTHROPLASTY;  Surgeon: Kathryne Hitch, MD;  Location: [facility];  Service: Orthopedics;  Laterality: Right;   Social History:  reports that she quit smoking about 30 years ago. Her smoking use included cigarettes. She has never used smokeless tobacco. She reports current alcohol use. She reports that she does not use drugs.  Allergies  Allergen Reactions   Amoxicillin     Hives  Sulfamethoxazole Nausea And Vomiting    See patient list of med intolerances due to h/o gastric bypass and nephrectomy    Family History  Problem Relation Age of Onset   Heart disease Mother        post CABG history of CHF   COPD Father    Diabetes Sister    Hypertension Sister        post renal transplant   Heart disease Brother        CAD    Prior to Admission medications   Medication Sig Start Date End Date Taking? Authorizing Provider  ALPRAZolam (XANAX) 0.25 MG tablet TAKE 1 TABLET(0.25 MG) BY MOUTH AT BEDTIME AS NEEDED FOR ANXIETY 12/23/22   Philip Aspen, Limmie Patricia, MD  amiodarone (PACERONE)  200 MG tablet Take 200 mg by mouth daily.    [provider]  amphetamine-dextroamphetamine (ADDERALL XR) 30 MG 24 hr capsule Take 1 capsule (30 mg total) by mouth every morning. 10/07/22   Philip Aspen, Limmie Patricia, MD  amphetamine-dextroamphetamine (ADDERALL) 20 MG tablet Take 1 tablet (20 mg total) by mouth daily. 10/07/22   Philip Aspen, Limmie Patricia, MD  apixaban (ELIQUIS) 5 MG TABS tablet Take 1 tablet (5 mg total) by mouth 2 (two) times daily. 09/09/22   Pricilla Riffle, MD  atorvastatin (LIPITOR) 40 MG tablet Take 1 tablet (40 mg total) by mouth daily. 12/17/22   Pricilla Riffle, MD  Cholecalciferol (VITAMIN D3) 50 MCG (2000 UT) TABS Take 2,000 Units by mouth daily in the afternoon.    [provider]  dapagliflozin propanediol (FARXIGA) 10 MG TABS tablet Take 1 tablet (10 mg total) by mouth daily before breakfast. 12/31/22   Sabharwal, Aditya, DO  FLUoxetine (PROZAC) 40 MG capsule TAKE 1 CAPSULE(40 MG) BY MOUTH EVERY MORNING 12/26/22   Philip Aspen, Limmie Patricia, MD  furosemide (LASIX) 40 MG tablet Take 20 mg by mouth as needed. Take one half (0.5) tablet by mouth ( 20 mg) as needed for wt gain of 3 lbs in 24 hours or 5 lbs in one week.    [provider]  lamoTRIgine (LAMICTAL) 100 MG tablet TAKE 1 TABLET(100 MG) BY MOUTH TWICE DAILY 11/13/22   Philip Aspen, Limmie Patricia, MD  losartan (COZAAR) 25 MG tablet Take 0.5 tablets (12.5 mg total) by mouth daily. 11/15/22 02/13/23  Sabharwal, Aditya, DO  metoprolol succinate (TOPROL-XL) 50 MG 24 hr tablet Take 1 tablet (50 mg total) by mouth at bedtime. Take with or immediately following a meal. 11/15/22 02/13/23  Sabharwal, Aditya, DO  potassium chloride (KLOR-CON) 10 MEQ tablet Take 10 mEq by mouth daily. 11/09/22   [provider]  spironolactone (ALDACTONE) 25 MG tablet Take 0.5 tablets (12.5 mg total) by mouth daily. 10/30/22   Robbie Lis M, PA-C  zolpidem (AMBIEN) 10 MG tablet TAKE 1 TABLET(10 MG) BY MOUTH AT BEDTIME  12/31/22   Philip Aspen, Limmie Patricia, MD    Physical Exam: Vitals:   01/02/23 2015 01/02/23 2030 01/02/23 2045 01/02/23 2100  BP: 134/72 135/71 (!) 140/80 112/68  Pulse: (!) 56 (!) 57 60 61  Resp: 17 14 (!) 21 14  Temp:      TempSrc:      SpO2: 94% 96% 97% 100%   Physical Exam Vitals and nursing note reviewed.  Constitutional:      General: She is not in acute distress.    Appearance: She is obese.  HENT:     Head: Normocephalic and atraumatic.  Mouth/Throat:     Mouth: Mucous membranes are moist.     Pharynx: Oropharynx is clear.  Eyes:     Conjunctiva/sclera: Conjunctivae normal.     Pupils: Pupils are equal, round, and reactive to light.  Cardiovascular:     Rate and Rhythm: Normal rate and regular rhythm.     Heart sounds: No murmur heard. Pulmonary:     Effort: Pulmonary effort is normal. No respiratory distress.     Breath sounds: Normal breath sounds. No wheezing, rhonchi or rales.  Musculoskeletal:     Right lower leg: No edema.     Left lower leg: No edema.     Comments: Difficulty sitting up beyond a 60 degree angle due to severity of pain  Skin:    General: Skin is warm and dry.     Comments: Abrasion noted on lateral left ankle and on left knee  Neurological:     General: No focal deficit present.     Mental Status: She is alert and oriented to person, place, and time. Mental status is at baseline.  Psychiatric:        Mood and Affect: Mood normal.        Behavior: Behavior normal.    Data Reviewed: CBC and CMP pending Vitamin D and B12 pending  EKG personally reviewed.  Sinus rhythm with rate of 54.  No ST or T wave changes concerning for acute ischemia.  CT Lumbar Spine Wo Contrast  Result Date: 01/02/2023 CLINICAL DATA:  Low back pain EXAM: CT LUMBAR SPINE WITHOUT CONTRAST TECHNIQUE: Multidetector CT imaging of the lumbar spine was performed without intravenous contrast administration. Multiplanar CT image reconstructions were also generated.  RADIATION DOSE REDUCTION: This exam was performed according to the departmental dose-optimization program which includes automated exposure control, adjustment of the mA and/or kV according to patient size and/or use of iterative reconstruction technique. COMPARISON:  MR L Spine 06/04/21 FINDINGS: Segmentation: 6 non-rib-bearing vertebral bodies with lumbarization of S1. Alignment: Grade 1 anterolisthesis of L5 on S1. Vertebrae: Mildly displaced fracture through the left S1 transverse process (series 5, image 44). There are nondisplaced fractures through the sacral ala bilaterally extending into the bilateral SI joints (series 3, image 109, 110). Paraspinal and other soft tissues: Aortic atherosclerotic calcifications. 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 Left kidney is not visualized. Disc levels: Moderate to severe spinal canal stenosis at L3-L4, L4-L5, and L5-S1. 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 1. Acute mildly displaced fracture through the left S1 transverse process. 2. Acute nondisplaced fractures through the sacral ala bilaterally extending into the bilateral SI joints. 3. Moderate to severe spinal canal stenosis at L3-L4, L4-L5, and L5-S1. Stable to slightly progressed compared to 2022. Aortic Atherosclerosis (ICD10-I70.0). Electronically Signed   By: Lorenza Cambridge M.D.   On: 01/02/2023 17:03   DG Hip Unilat W or Wo Pelvis 2-3 Views Left  Result Date: 01/02/2023 CLINICAL DATA:  Pain after fall EXAM: DG HIP (WITH OR WITHOUT PELVIS) 3V LEFT COMPARISON:  None Available. FINDINGS: Osteopenia. Preserved joint spaces. Hyperostosis. No obvious fracture. Mild degenerative changes of the sacroiliac joints. Soft tissue calcifications along the upper medial thigh regions bilaterally, nonspecific. With this level of osteopenia evaluation for subtle nondisplaced injury is difficult to completely exclude and if needed  cross-sectional imaging as clinically appropriate. IMPRESSION: Osteopenia with degenerative changes. Electronically Signed   By: Karen Kays M.D.   On: 01/02/2023 16:33    Results are  pending, will review when available.  Assessment and Plan:  * Sacral fracture [facility]) Patient is presenting after ground-level fall that was mechanical in nature with CT imaging demonstrated acute mildly displaced fracture through the left S1 transverse process in addition to acute fractures throughout the sacral alla bilaterally.  Neurosurgery has been consulted, likely nonsurgical management.    - Neurosurgery consulted; appreciate their recommendations - MRI of the lumbar spine pending - Tylenol 650 mg every 6 hours - Norco and Dilaudid as needed for pain control - PT/OT evaluation.  Weightbearing as tolerated.  AKI (acute kidney injury) [facility]) Patient had a recent follow-up with the CHF clinic at which time her blood work demonstrated an AKI, potentially due to initiation of losartan in the setting of nephrectomy.  Will recheck blood work today and hold home losartan.  - BMP pending - Hold home losartan and spironolactone until results  Chronic systolic CHF (congestive heart failure) (HCC) Patient appears euvolemic at this time and states that she is within her home baseline weight.  She weighs herself daily and continues to take Lasix daily  - Continue home Lasix, metoprolol and Farxiga - Daily weights - Hold home losartan and spironolactone due to AKI as noted above  Essential hypertension Well-controlled at this time.  - Holding home losartan and spironolactone - Continue home metoprolol  S/P gastric bypass Patient has a history of gastric bypass but has not been taking any multivitamins for many years now.  She is at high risk for multiple vitamin deficiencies including vitamin D deficiency.  She states that she has known vitamin D deficiency, last checked 1 year ago at which time it was 25.  We  discussed the importance of initiating daily vitamins.  - Dietitian consulted - Vitamin B12 and D pending  Advance Care Planning:   Code Status: Full Code verified by patient  Consults: Neurosurgery  Family Communication: No family at bedside  Severity of Illness: The appropriate patient status for this patient is OBSERVATION. Observation status is judged to be reasonable and necessary in order to provide the required intensity of service to ensure the patient's safety. The patient's presenting symptoms, physical exam findings, and initial radiographic and laboratory data in the context of their medical condition is felt to place them at decreased risk for further clinical deterioration. Furthermore, it is anticipated that the patient will be medically stable for discharge from the hospital within 2 midnights of admission.   Author: Verdene Lennert, MD 01/02/2023 9:25 PM  For on call review [email or web address].

## 2023-01-03 ENCOUNTER — Other Ambulatory Visit: Payer: Self-pay

## 2023-01-03 DIAGNOSIS — D539 Nutritional anemia, unspecified: Secondary | ICD-10-CM | POA: Diagnosis present

## 2023-01-03 DIAGNOSIS — F909 Attention-deficit hyperactivity disorder, unspecified type: Secondary | ICD-10-CM | POA: Diagnosis present

## 2023-01-03 DIAGNOSIS — E876 Hypokalemia: Secondary | ICD-10-CM | POA: Diagnosis not present

## 2023-01-03 DIAGNOSIS — F05 Delirium due to known physiological condition: Secondary | ICD-10-CM | POA: Diagnosis not present

## 2023-01-03 DIAGNOSIS — I480 Paroxysmal atrial fibrillation: Secondary | ICD-10-CM | POA: Diagnosis present

## 2023-01-03 DIAGNOSIS — E8721 Acute metabolic acidosis: Secondary | ICD-10-CM | POA: Diagnosis not present

## 2023-01-03 DIAGNOSIS — I428 Other cardiomyopathies: Secondary | ICD-10-CM | POA: Diagnosis present

## 2023-01-03 DIAGNOSIS — W1830XA Fall on same level, unspecified, initial encounter: Secondary | ICD-10-CM | POA: Diagnosis present

## 2023-01-03 DIAGNOSIS — E860 Dehydration: Secondary | ICD-10-CM | POA: Diagnosis present

## 2023-01-03 DIAGNOSIS — I5042 Chronic combined systolic (congestive) and diastolic (congestive) heart failure: Secondary | ICD-10-CM | POA: Diagnosis present

## 2023-01-03 DIAGNOSIS — S3210XA Unspecified fracture of sacrum, initial encounter for closed fracture: Secondary | ICD-10-CM | POA: Diagnosis present

## 2023-01-03 DIAGNOSIS — F419 Anxiety disorder, unspecified: Secondary | ICD-10-CM | POA: Diagnosis present

## 2023-01-03 DIAGNOSIS — E559 Vitamin D deficiency, unspecified: Secondary | ICD-10-CM | POA: Diagnosis not present

## 2023-01-03 DIAGNOSIS — N179 Acute kidney failure, unspecified: Secondary | ICD-10-CM | POA: Diagnosis present

## 2023-01-03 DIAGNOSIS — E785 Hyperlipidemia, unspecified: Secondary | ICD-10-CM | POA: Diagnosis present

## 2023-01-03 DIAGNOSIS — N1832 Chronic kidney disease, stage 3b: Secondary | ICD-10-CM | POA: Diagnosis present

## 2023-01-03 DIAGNOSIS — E538 Deficiency of other specified B group vitamins: Secondary | ICD-10-CM | POA: Diagnosis present

## 2023-01-03 DIAGNOSIS — E871 Hypo-osmolality and hyponatremia: Secondary | ICD-10-CM | POA: Diagnosis not present

## 2023-01-03 DIAGNOSIS — S32119A Unspecified Zone I fracture of sacrum, initial encounter for closed fracture: Secondary | ICD-10-CM | POA: Diagnosis present

## 2023-01-03 DIAGNOSIS — I10 Essential (primary) hypertension: Secondary | ICD-10-CM | POA: Diagnosis not present

## 2023-01-03 DIAGNOSIS — I2510 Atherosclerotic heart disease of native coronary artery without angina pectoris: Secondary | ICD-10-CM | POA: Diagnosis present

## 2023-01-03 DIAGNOSIS — F32A Depression, unspecified: Secondary | ICD-10-CM | POA: Diagnosis present

## 2023-01-03 DIAGNOSIS — Z6834 Body mass index (BMI) 34.0-34.9, adult: Secondary | ICD-10-CM | POA: Diagnosis not present

## 2023-01-03 DIAGNOSIS — I130 Hypertensive heart and chronic kidney disease with heart failure and stage 1 through stage 4 chronic kidney disease, or unspecified chronic kidney disease: Secondary | ICD-10-CM | POA: Diagnosis present

## 2023-01-03 DIAGNOSIS — F03A0 Unspecified dementia, mild, without behavioral disturbance, psychotic disturbance, mood disturbance, and anxiety: Secondary | ICD-10-CM | POA: Diagnosis present

## 2023-01-03 DIAGNOSIS — Y9301 Activity, walking, marching and hiking: Secondary | ICD-10-CM | POA: Diagnosis present

## 2023-01-03 DIAGNOSIS — I5022 Chronic systolic (congestive) heart failure: Secondary | ICD-10-CM | POA: Diagnosis not present

## 2023-01-03 DIAGNOSIS — E669 Obesity, unspecified: Secondary | ICD-10-CM | POA: Diagnosis present

## 2023-01-03 DIAGNOSIS — D696 Thrombocytopenia, unspecified: Secondary | ICD-10-CM | POA: Diagnosis not present

## 2023-01-03 DIAGNOSIS — E440 Moderate protein-calorie malnutrition: Secondary | ICD-10-CM | POA: Diagnosis present

## 2023-01-03 LAB — VITAMIN D 25 HYDROXY (VIT D DEFICIENCY, FRACTURES): Vit D, 25-Hydroxy: 23.11 ng/mL — ABNORMAL LOW (ref 30–100)

## 2023-01-03 MED ORDER — KATE FARMS STANDARD 1.4 PO LIQD
325.0000 mL | Freq: Every day | ORAL | Status: DC
  Administered 2023-01-03 – 2023-01-09 (×6): 325 mL via ORAL
  Filled 2023-01-03 (×7): qty 325

## 2023-01-03 MED ORDER — METOPROLOL SUCCINATE ER 50 MG PO TB24
50.0000 mg | ORAL_TABLET | Freq: Every day | ORAL | Status: DC
  Administered 2023-01-03 – 2023-01-08 (×6): 50 mg via ORAL
  Filled 2023-01-03 (×6): qty 1

## 2023-01-03 MED ORDER — CYANOCOBALAMIN 1000 MCG/ML IJ SOLN
1000.0000 ug | Freq: Every day | INTRAMUSCULAR | Status: AC
  Administered 2023-01-03 – 2023-01-07 (×5): 1000 ug via INTRAMUSCULAR
  Filled 2023-01-03 (×5): qty 1

## 2023-01-03 MED ORDER — VITAMIN D (ERGOCALCIFEROL) 1.25 MG (50000 UNIT) PO CAPS
50000.0000 [IU] | ORAL_CAPSULE | ORAL | Status: DC
  Administered 2023-01-03: 50000 [IU] via ORAL
  Filled 2023-01-03: qty 1

## 2023-01-03 NOTE — Discharge Instructions (Signed)
Vitamin and Mineral Supplements It is a good idea to check your vitamin regimen to make sure it still meets the most current bariatric guidelines There are no over the counter multivitamins that meet bariatric surgery guidelines Take your calcium supplement separate from your multivitamins with iron or any iron supplement by 2+ hours  Bariatric Requirements (Daily) Sleeve & RNY Gastric Bypass What My Supplements Provide Additional Notes  Vitamin B1 (Thiamin) 12-50 mg    B12 350-500 mcg  -If taking B12 via nasal spray, take as directed -If receiving B12 shot, 1,000 mcg monthly  Folic Acid 400-800 mcg  If female of childbearing age, need 800-1,000 mcg  Vitamin A 5,000-10,000 IU    Vitamin D 3,000 IU    Vitamin E 15 mg    Vitamin K 90-120 mcg    Zinc Sleeve: 8-11 mg RNY: 8-22 mg    Copper Sleeve: 1 mg RNY: 2 mg  Copper gluconate or Copper sulfate  Iron 18 mg if low risk 45-60 mg if high risk*  *High risk = history of iron def. anemia, or menstruating female       Calcium  1,200-1,500 mg  (Take 500 mg tablets 3x/day)  -Calcium carbonate, take with meals -Calcium citrate may be taken with or without meals

## 2023-01-03 NOTE — TOC Initial Note (Signed)
Transition of Care [facility]) - Initial/Assessment Note   Patient Details  Name: Leslie [name] MRN: [id number] Date of Birth: 07-14-1951  Transition of Care [facility]) CM/SW Contact:    Ewing Schlein, LCSW Phone Number: 01/03/2023, 2:33 PM  Clinical Narrative: TOC following for possible discharge needs. Awaiting PT/OT evaluations.  Expected Discharge Plan:  (To be determined) Barriers to Discharge: Continued Medical Work up  Expected Discharge Plan and Services In-house Referral: Clinical Social Work Living arrangements for the past 2 months: Single Family Home  Prior Living Arrangements/Services Living arrangements for the past 2 months: Single Family Home Patient language and need for interpreter reviewed:: Yes Need for Family Participation in Patient Care: No (Comment) Care giver support system in place?: Yes (comment) Criminal Activity/Legal Involvement Pertinent to Current Situation/Hospitalization: No - Comment as needed  Activities of Daily Living Home Assistive Devices/Equipment: Walker (specify type) ADL Screening (condition at time of admission) Patient's cognitive ability adequate to safely complete daily activities?: Yes Is the patient deaf or have difficulty hearing?: No Does the patient have difficulty seeing, even when wearing glasses/contacts?: No Does the patient have difficulty concentrating, remembering, or making decisions?: No Patient able to express need for assistance with ADLs?: Yes Does the patient have difficulty dressing or bathing?: No Independently performs ADLs?: Yes (appropriate for developmental age) Does the patient have difficulty walking or climbing stairs?: No (not PTA) Weakness of Legs: None (not PTA) Weakness of Arms/Hands: None (not PTA)  Emotional Assessment Orientation: : Oriented to Self, Oriented to Place, Oriented to  Time, Oriented to Situation Alcohol / Substance Use: Not Applicable Psych Involvement: No (comment)  Admission  diagnosis:  Closed fracture of sacrum, unspecified portion of sacrum, initial encounter (HCC) [S32.10XA] Ground-level fall [W18.30XA] Sacral fracture (HCC) [S32.10XA] Patient Active Problem List   Diagnosis Date Noted   Sacral fracture (HCC) 01/02/2023   Chronic systolic CHF (congestive heart failure) (HCC) 01/02/2023   AKI (acute kidney injury) (HCC) 01/02/2023   S/P gastric bypass 01/02/2023   Renal insufficiency 09/19/2022   Acute CHF (congestive heart failure) (HCC) 08/07/2022   Status post right knee replacement 05/04/2021   Vitamin B12 deficiency 03/14/2021   Macrocytosis without anemia 03/14/2021   Vitamin D deficiency 02/17/2020   Sleep disorder 07/10/2018   ADHD (attention deficit hyperactivity disorder) 10/01/2017   Unilateral primary osteoarthritis, right knee 08/20/2017   Chronic pain of both shoulders 08/20/2017   Complete tear of right rotator cuff 04/29/2017   Osteoarthritis of left knee 06/16/2015   Status post total left knee replacement 06/16/2015   Attention deficit disorder 09/14/2007   COLONIC POLYPS, HX OF 08/03/2007   Essential hypertension 01/20/2007   OBESITY NOS 12/04/2006   Allergic rhinitis 12/04/2006   GASTROJEJUNOSTOMY, HX OF 12/04/2006   BREAST BIOPSY, HX OF 12/04/2006   PCP:  Philip Aspen, Limmie Patricia, MD Pharmacy:   [facility] [phone] [place], [place] - [address] AT [facility] [address] & [facility] [address] [place] [address] Phone: [phone] Fax: [phone]  [facility] [phone] - [place], [place] - [address] AT [facility] OF [address] [place] [address] Phone: [phone] Fax: [phone]  Social Determinants of Health (SDOH) Social History: SDOH Screenings   Food Insecurity: No Food Insecurity (01/03/2023)  Housing: Low Risk  (01/03/2023)  Transportation Needs: No Transportation Needs (01/03/2023)  Utilities: Not At Risk (01/03/2023)   Alcohol Screen: Low Risk  (09/20/2022)  Depression (PHQ2-9): Low Risk  (06/17/2022)  Financial Resource  Strain: Low Risk  (09/20/2022)  Physical Activity: Unknown (10/04/2021)  Social Connections: Unknown (10/04/2021)  Tobacco Use: Medium Risk (01/02/2023)   SDOH Interventions:    Readmission Risk Interventions     No data to display

## 2023-01-03 NOTE — Plan of Care (Signed)
  Problem: Coping: Goal: Level of anxiety will decrease Outcome: Progressing   Problem: Pain Managment: Goal: General experience of comfort will improve Outcome: Progressing   Problem: Safety: Goal: Ability to remain free from injury will improve Outcome: Progressing   

## 2023-01-03 NOTE — Progress Notes (Signed)
PROGRESS NOTE    Leslie [name]  NFA:[id number] DOB: July 14, 1951 DOA: 01/02/2023 PCP: Philip Aspen, Limmie Patricia, MD   Brief Narrative:   72 y.o. female with medical history significant of HFrEF with EF of 45%, atrial fibrillation on Eliquis, hypertension, obesity s/p gastric bypass, s/p nephrectomy presented with a ground-level fall and severe lower back pain.  On presentation, hip x-ray showed osteopenia and degenerative changes.  CT of the lumbar spine without contrast showed acute mildly displaced fracture through the left S1 transverse process; acute nondisplaced fractures through the sacral alla bilaterally with moderate to severe spinal canal stenosis at L3-L4, L4-L5, L5 and S1.  EDP spoke to on-call neurosurgery who recommended MRI of lumbar spine.  Assessment & Plan:   Sacral fracture Intractable back pain after mechanical fall -Imaging as above. -Continue pain management.  Fall precautions.  Bowel regimen -PT eval  Moderate to severe lumbar spinal stenosis -CT findings as above.  MRI of lumbar spine showed severe L5-S1 spinal stenosis with moderate L4-L5 spinal stenosis and mild to moderate L3-L4 spinal stenosis. -Neurosurgery recommending outpatient follow-up.  Acute kidney injury on chronic kidney disease stage IIIb -Creatinine 2.1 on presentation.  Improvement 1.5.  Treated with IV fluids and subsequently discontinued.  Monitor.  Home losartan and spironolactone on hold.  Chronic systolic CHF Essential hypertension Hyperlipidemia -Volume A.  Strict input and output.  Daily weights.  Follows up with heart failure team as an outpatient.  Continue Lasix and Farxiga.  Resume metoprolol.  Will keep losartan and spironolactone on hold for today as well.  Outpatient follow-up with heart failure team -Continue statin  Hyponatremia -Monitor  Hypokalemia -Resolved  Acute metabolic acidosis -Monitor.  Vitamin B12 deficiency -B12 176.  Start supplementation  Vitamin D  deficiency -Start supplementation  Macrocytic anemia -Possibly from vitamin B12 deficiency.  Hemoglobin stable.  Thrombocytopenia -Questionable cause.  No labs today  Paroxysmal A-fib -Currently rate controlled.  Continue apixaban and amiodarone.  Resume metoprolol  Status post gastric bypass -Outpatient follow-up  Obesity -Outpatient follow-up   DVT prophylaxis: Apixaban Code Status: Full Family Communication: None at bedside Disposition Plan: Status is: Observation The patient will require care spanning > 2 midnights and should be moved to inpatient because: Of need for PT evaluation.  Still in significant pain.    Consultants: Neurosurgery  Procedures: None  Antimicrobials: None   Subjective: Patient seen and examined at bedside.  Still complains of severe intermittent lower back pain.  No fever, vomiting, chest pain reported.  Objective: Vitals:   01/02/23 2238 01/03/23 0118 01/03/23 0500 01/03/23 0655  BP: 93/73 (!) 99/58  113/86  Pulse: 63 66  63  Resp: 15 19  17   Temp: 98.6 F (37 C) 98 F (36.7 C)  98.2 F (36.8 C)  TempSrc: Oral Oral  Oral  SpO2: 100% 100%  100%  Weight: 85.3 kg  86 kg   Height: 5\' 4"  (1.626 m)       Intake/Output Summary (Last 24 hours) at 01/03/2023 1012 Last data filed at 01/03/2023 0948 Gross per 24 hour  Intake 591 ml  Output 550 ml  Net 41 ml   Filed Weights   01/02/23 2238 01/03/23 0500  Weight: 85.3 kg 86 kg    Examination:  General exam: Appears calm and comfortable.  On room air. Respiratory system: Bilateral decreased breath sounds at bases with some scattered crackles Cardiovascular system: S1 & S2 heard, Rate controlled Gastrointestinal system: Abdomen is nondistended, soft and nontender. Normal bowel sounds heard.  Extremities: No cyanosis, clubbing; trace lower extremity edema present  Central nervous system: Alert and oriented. No focal neurological deficits. Moving extremities Skin: No rashes, lesions or  ulcers Psychiatry: Judgement and insight appear normal. Mood & affect appropriate.     Data Reviewed: I have personally reviewed following labs and imaging studies  CBC: Recent Labs  Lab 01/02/23 2148  WBC 5.5  NEUTROABS 4.7  HGB 10.3*  HCT 32.2*  MCV 103.9*  PLT 148*   Basic Metabolic Panel: Recent Labs  Lab 12/31/22 1227 01/02/23 2148  NA 133* 130*  K 5.3* 3.8  CL 100 102  CO2 21* 19*  GLUCOSE 86 93  BUN 52* 37*  CREATININE 2.21* 1.50*  CALCIUM 9.0 8.6*   GFR: Estimated Creatinine Clearance: 36 mL/min (A) (by C-G formula based on SCr of 1.5 mg/dL (H)). Liver Function Tests: No results for input(s): "AST", "ALT", "ALKPHOS", "BILITOT", "PROT", "ALBUMIN" in the last 168 hours. No results for input(s): "LIPASE", "AMYLASE" in the last 168 hours. No results for input(s): "AMMONIA" in the last 168 hours. Coagulation Profile: No results for input(s): "INR", "PROTIME" in the last 168 hours. Cardiac Enzymes: No results for input(s): "CKTOTAL", "CKMB", "CKMBINDEX", "TROPONINI" in the last 168 hours. BNP (last 3 results) Recent Labs    08/22/22 1125 08/30/22 1335 09/12/22 1437  PROBNP 6,931* 3,625* 1,775*   HbA1C: No results for input(s): "HGBA1C" in the last 72 hours. CBG: No results for input(s): "GLUCAP" in the last 168 hours. Lipid Profile: No results for input(s): "CHOL", "HDL", "LDLCALC", "TRIG", "CHOLHDL", "LDLDIRECT" in the last 72 hours. Thyroid Function Tests: No results for input(s): "TSH", "T4TOTAL", "FREET4", "T3FREE", "THYROIDAB" in the last 72 hours. Anemia Panel: Recent Labs    01/02/23 2148  VITAMINB12 176*   Sepsis Labs: No results for input(s): "PROCALCITON", "LATICACIDVEN" in the last 168 hours.  No results found for this or any previous visit (from the past 240 hour(s)).       Radiology Studies: MR LUMBAR SPINE WO CONTRAST  Result Date: 01/02/2023 CLINICAL DATA:  Fall, back pain, sacral fractures EXAM: MRI LUMBAR SPINE WITHOUT  CONTRAST TECHNIQUE: Multiplanar, multisequence MR imaging of the lumbar spine was performed. No intravenous contrast was administered. COMPARISON:  01/02/2023 CT lumbar spine, 06/04/2021 MRI lumbar spine FINDINGS: Segmentation: In keeping with the same day CT lumbar spine and in correlation with the 08/07/2022 CT chest, there are 6 non rib-bearing vertebral bodies, the last of which is labeled S1, which is lumbarized. Alignment:  5 mm anterolisthesis of L5 on S1. Vertebrae: Increased T2 signal in the left sacral ala (series 6, image 13), and in the central sacrum (series 6, image 9), consistent with the fractures that are better seen on the same-day CT lumbar spine. No acute fracture in the lumbar spine. Conus medullaris and cauda equina: Conus extends to the L1-L2 level. Conus and cauda equina appear normal. Paraspinal and other soft tissues: Small renal cysts, for which no follow-up is currently indicated. The left kidney is likely surgically absent. No lymphadenopathy. Disc levels: T12-L1: Seen only on the sagittal images. No significant disc bulge, spinal canal stenosis, or neural foraminal narrowing. L1-L2: Minimal disc bulge. No spinal canal stenosis or neural foraminal narrowing. L2-L3: Minimal disc bulge. No spinal canal stenosis or neural foraminal narrowing. L3-L4: Mild disc bulge. Mild facet arthropathy. Ligamentum flavum hypertrophy. Narrowing of the lateral recesses. Mild to moderate spinal canal stenosis. Mild right neural foraminal narrowing. L4-L5: Moderate disc bulge. Moderate facet arthropathy. Ligamentum flavum hypertrophy. Narrowing of  the lateral recesses. Moderate spinal canal stenosis. No neural foraminal narrowing. L5-S1: Grade 1 anterolisthesis with disc unroofing. Moderate disc bulge. Severe facet arthropathy. Ligamentum flavum hypertrophy. Effacement of the lateral recesses. Severe spinal canal stenosis. No neural foraminal narrowing. S1-S2: Minimal disc bulge with right greater than left  lateral disc osteophyte complex, which may contact the exiting right S1 nerve. Mild facet arthropathy. No spinal canal stenosis or neural foraminal narrowing. IMPRESSION: 1. Sacral fractures are better seen on the same-day CT lumbar spine. No acute fracture in the lumbar spine. 2. L5-S1 severe spinal canal stenosis. Effacement of the lateral recesses at this level likely compresses the descending S1 nerve roots. 3. L4-L5 moderate spinal canal stenosis. Narrowing of the lateral recesses at this level could affect the descending L5 nerve roots. 4. L3-L4 mild to moderate spinal canal stenosis and mild right neural foraminal narrowing. Narrowing of the lateral recesses at this level could affect the descending L4 nerve roots. Electronically Signed   By: Wiliam Ke M.D.   On: 01/02/2023 23:16   CT Lumbar Spine Wo Contrast  Result Date: 01/02/2023 CLINICAL DATA:  Low back pain EXAM: CT LUMBAR SPINE WITHOUT CONTRAST TECHNIQUE: Multidetector CT imaging of the lumbar spine was performed without intravenous contrast administration. Multiplanar CT image reconstructions were also generated. RADIATION DOSE REDUCTION: This exam was performed according to the departmental dose-optimization program which includes automated exposure control, adjustment of the mA and/or kV according to patient size and/or use of iterative reconstruction technique. COMPARISON:  MR L Spine 06/04/21 FINDINGS: Segmentation: 6 non-rib-bearing vertebral bodies with lumbarization of S1. Alignment: Grade 1 anterolisthesis of L5 on S1. Vertebrae: Mildly displaced fracture through the left S1 transverse process (series 5, image 44). There are nondisplaced fractures through the sacral ala bilaterally extending into the bilateral SI joints (series 3, image 109, 110). Paraspinal and other soft tissues: Aortic atherosclerotic calcifications. 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 Left kidney is not visualized. Disc levels: Moderate to severe  spinal canal stenosis at L3-L4, L4-L5, and L5-S1. 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 1. Acute mildly displaced fracture through the left S1 transverse process. 2. Acute nondisplaced fractures through the sacral ala bilaterally extending into the bilateral SI joints. 3. Moderate to severe spinal canal stenosis at L3-L4, L4-L5, and L5-S1. Stable to slightly progressed compared to 2022. Aortic Atherosclerosis (ICD10-I70.0). Electronically Signed   By: Lorenza Cambridge M.D.   On: 01/02/2023 17:03   DG Hip Unilat W or Wo Pelvis 2-3 Views Left  Result Date: 01/02/2023 CLINICAL DATA:  Pain after fall EXAM: DG HIP (WITH OR WITHOUT PELVIS) 3V LEFT COMPARISON:  None Available. FINDINGS: Osteopenia. Preserved joint spaces. Hyperostosis. No obvious fracture. Mild degenerative changes of the sacroiliac joints. Soft tissue calcifications along the upper medial thigh regions bilaterally, nonspecific. With this level of osteopenia evaluation for subtle nondisplaced injury is difficult to completely exclude and if needed cross-sectional imaging as clinically appropriate. IMPRESSION: Osteopenia with degenerative changes. Electronically Signed   By: Karen Kays M.D.   On: 01/02/2023 16:33        Scheduled Meds:  acetaminophen  650 mg Oral Q6H   ALPRAZolam  0.25 mg Oral QHS   amiodarone  200 mg Oral Daily   apixaban  5 mg Oral BID   atorvastatin  40 mg Oral Daily   dapagliflozin propanediol  10 mg Oral QAC breakfast   FLUoxetine  40 mg Oral q AM   folic acid  1  mg Oral Daily   furosemide  40 mg Oral Daily   lamoTRIgine  100 mg Oral BID   multivitamin with minerals  1 tablet Oral Daily   thiamine  100 mg Oral Daily   Continuous Infusions:        Glade Lloyd, MD [facility] 01/03/2023, 10:12 AM

## 2023-01-03 NOTE — Evaluation (Signed)
Physical Therapy Evaluation Patient Details Name: Leslie [name] MRN: [id number] DOB: 01-11-1951 Today's Date: 01/03/2023  History of Present Illness  72 y.o. female with medical history significant of HFrEF with EF of 45%, atrial fibrillation on Eliquis, hypertension, obesity s/p gastric bypass, s/p nephrectomy, bil TKAs presented with a ground-level fall and severe lower back pain.  On presentation, hip x-ray showed osteopenia and degenerative changes.  CT of the lumbar spine without contrast showed acute mildly displaced fracture through the left S1 transverse process; acute nondisplaced fractures through the sacral alla bilaterally with moderate to severe spinal canal stenosis at L3-L4, L4-L5, L5 and S1.  Per neurosurgery note: "Pt may f/u on an outpt basis in regard to lumbar stenosis if becomes symptomatic. In regard to fractures, pt to participate in activity as tolerated. Consider IR consult for sacroplasty."  Clinical Impression  Pt admitted with above diagnosis.  Pt currently with functional limitations due to the deficits listed below (see PT Problem List). Pt will benefit from acute skilled PT to increase their independence and safety with mobility to allow discharge.  Pt pleasant and agreeable to attempt mobility.   Pt with increased pain in sacral area and shooting pain down posterior legs with weight bearing.  Pt only able to briefly tolerate standing position before requesting to return to supine due to pain.  Pt reports a fall in her kitchen and then another fall on attempt to get up at home leading to admission as pt's pain increased.  Pt lives at home alone and has daughter however they cannot provide 24/7 assist.  Pt hopeful to return home however may require more assist upon d/c pending pain and safely mobilizing.          Recommendations for follow up therapy are one component of a multi-disciplinary discharge planning process, led by the attending physician.  Recommendations may  be updated based on patient status, additional functional criteria and insurance authorization.  Follow Up Recommendations       Assistance Recommended at Discharge Intermittent Supervision/Assistance  Patient can return home with the following  A little help with walking and/or transfers;A little help with bathing/dressing/bathroom;Help with stairs or ramp for entrance;Assist for transportation;Assistance with cooking/housework    Equipment Recommendations None recommended by PT  Recommendations for Other Services       Functional Status Assessment Patient has had a recent decline in their functional status and demonstrates the ability to make significant improvements in function in a reasonable and predictable amount of time.     Precautions / Restrictions Precautions Precautions: Fall;Back Precaution Comments: back for comfort Restrictions Weight Bearing Restrictions: No      Mobility  Bed Mobility Overal bed mobility: Needs Assistance Bed Mobility: Supine to Sit, Sit to Supine     Supine to sit: Min assist Sit to supine: Mod assist   General bed mobility comments: assist for trunk upright and LEs onto bed for pain control, pt requested leaving pillows in place (under her hips) with attempts to mobilizing hoping to assist with pain management, repositioned pillows upon returning to bed to pt comfort    Transfers Overall transfer level: Needs assistance Equipment used: Rolling walker (2 wheels) Transfers: Sit to/from Stand Sit to Stand: Min assist, +2 safety/equipment           General transfer comment: assist for stability and due to increased pain, pt not able to tolerate standing position and requested to return to bed    Ambulation/Gait  Stairs            Wheelchair Mobility    Modified Rankin (Stroke Patients Only)       Balance Overall balance assessment: History of Falls                                            Pertinent Vitals/Pain Pain Assessment Pain Assessment: 0-10 Pain Score: 10-Worst pain ever Pain Location: "V" shape in sacral area, shooting down posterior legs with standing Pain Descriptors / Indicators: Shooting, Sharp Pain Intervention(s): Repositioned, Monitored during session, Patient requesting pain meds-RN notified    Home Living Family/patient expects to be discharged to:: Private residence Living Arrangements: Alone Available Help at Discharge: Available PRN/intermittently;Family Type of Home: House Home Access: Stairs to enter   [employer] of Steps: 1   Home Layout: One level Home Equipment: Agricultural consultant (2 wheels);Transport chair      Prior Function Prior Level of Function : Independent/Modified Independent             Mobility Comments: was using RW prior to admission       Hand Dominance        Extremity/Trunk Assessment        Lower Extremity Assessment Lower Extremity Assessment: Generalized weakness       Communication   Communication: No difficulties  Cognition Arousal/Alertness: Awake/alert Behavior During Therapy: WFL for tasks assessed/performed Overall Cognitive Status: Within Functional Limits for tasks assessed                                          General Comments      Exercises     Assessment/Plan    PT Assessment Patient needs continued PT services  PT Problem List Decreased strength;Pain;Decreased activity tolerance;Decreased balance;Decreased mobility       PT Treatment Interventions DME instruction;Gait training;Balance training;Therapeutic exercise;Functional mobility training;Therapeutic activities;Patient/family education    PT Goals (Current goals can be found in the Care Plan section)  Acute Rehab PT Goals PT Goal Formulation: With patient Time For Goal Achievement: 01/17/23 Potential to Achieve Goals: Good    Frequency Min 1X/week     Co-evaluation                AM-PAC PT "6 Clicks" Mobility  Outcome Measure Help needed turning from your back to your side while in a flat bed without using bedrails?: A Little Help needed moving from lying on your back to sitting on the side of a flat bed without using bedrails?: A Lot Help needed moving to and from a bed to a chair (including a wheelchair)?: A Lot Help needed standing up from a chair using your arms (e.g., wheelchair or bedside chair)?: A Lot Help needed to walk in hospital room?: A Lot Help needed climbing 3-5 steps with a railing? : Total 6 Click Score: 12    End of Session Equipment Utilized During Treatment: Gait belt Activity Tolerance: Patient limited by pain Patient left: in bed;with call bell/phone within reach;with bed alarm set Nurse Communication: Mobility status;Patient requests pain meds PT Visit Diagnosis: Difficulty in walking, not elsewhere classified (R26.2)    Time: 1610-9604 PT Time Calculation (min) (ACUTE ONLY): 14 min   Charges:   PT Evaluation $PT Eval Low Complexity: 1 Low  Paulino Door, DPT Physical Therapist Acute Rehabilitation Services Office: [phone]   Carlyon Prows 01/03/2023, 3:43 PM

## 2023-01-03 NOTE — Progress Notes (Signed)
Pt presented to ED on 01/02/23 after GLF with increased LBP/sacral pain. Per ED exam, no neurologic deficits. CT notable for L S1 TP, and B sacral ala fx. All minimally displaced. MRI notable for underlying mod/severe stenosis at L4-5, L5-S1. Pt may f/u on an outpt basis in regard to lumbar stenosis if becomes symptomatic. In regard to fractures, pt to participate in activity as tolerated. Consider IR consult for sacroplasty.   Call w/ questions/concerns.  Patrici Ranks, [facility]

## 2023-01-03 NOTE — Progress Notes (Signed)
Initial Nutrition Assessment  DOCUMENTATION CODES:   Non-severe (moderate) malnutrition in context of chronic illness  INTERVENTION:   -Will order [employer] 1.4 po daily, each supplement provides 455 kcal and 20 grams protein.    -Continue current vitamin regimen. Emphasized to patient the importance of taking daily MVI.  -Placed bariatric vitamin and mineral information in AVS   -Consider diet liberalization if hyponatremia continues  NUTRITION DIAGNOSIS:   Moderate Malnutrition related to chronic illness (h/o gastric bypass) as evidenced by mild fat depletion, mild muscle depletion.  GOAL:   Patient will meet greater than or equal to 90% of their needs  MONITOR:   PO intake, Supplement acceptance, Labs, Weight trends, I & O's  REASON FOR ASSESSMENT:   Consult Assessment of nutrition requirement/status  ASSESSMENT:   72 y.o. female with medical history significant of HFrEF with EF of 45%, atrial fibrillation on Eliquis, hypertension, obesity s/p gastric bypass, s/p nephrectomy presented with a ground-level fall and severe lower back pain.  On presentation, hip x-ray showed osteopenia and degenerative changes.  Patient reports typically only consuming 2 meals a day. She has trouble standing for a long time so she is limited in her cooking ability. Makes meals such as chicken and rice or fish and a side. Snacks on pretzels. Doesn't usually consume breakfast but has been eating here as someone else prepares it.  Pt endorses that she was not taking a daily MVI or other vitamins. Pt states she has had eye surgeries in the past so she has been taking Preservision BID (contains Vitamin C, vitamin E, Zinc and Copper). Will place information on necessary vitamins and minerals following bariatric surgery in AVS for patient. Pt denies any hair loss or numbness or tingling in extremities. Just endorses increased weakness in BLEs. Has been falling at home.  Pt agreeable to receiving  plant based protein supplements as dairy specifically whey protein has caused GI upset.   Pt reports UB N~829-562 lbs. States her weight has remained stable.   Medications: Vitamin B-12, Folic acid, Lasix, Multivitamin with minerals daily, Thiamine, Vitamin D  Labs reviewed: Low Na Low Vitamin D Low Vitamin B-12   NUTRITION - FOCUSED PHYSICAL EXAM:  Flowsheet Row Most Recent Value  Orbital Region Mild depletion  Upper Arm Region Mild depletion  Thoracic and Lumbar Region No depletion  Buccal Region Mild depletion  Temple Region Mild depletion  Clavicle Bone Region Mild depletion  Clavicle and Acromion Bone Region Mild depletion  Scapular Bone Region Mild depletion  Dorsal Hand Mild depletion  Patellar Region No depletion  Anterior Thigh Region No depletion  Posterior Calf Region Mild depletion  Edema (RD Assessment) None  Hair Reviewed  Eyes Reviewed  Mouth Reviewed  Skin Reviewed       Diet Order:   Diet Order             Diet Heart Room service appropriate? Yes; Fluid consistency: Thin; Fluid restriction: 1200 mL Fluid  Diet effective now                   EDUCATION NEEDS:   Education needs have been addressed  Skin:  Skin Assessment: Reviewed RN Assessment  Last BM:  PTA  Height:   Ht Readings from Last 1 Encounters:  01/02/23 5\' 4"  (1.626 m)    Weight:   Wt Readings from Last 1 Encounters:  01/03/23 86 kg    BMI:  Body mass index is 32.54 kg/m.  Estimated Nutritional Needs:  Kcal:  1450-1650  Protein:  75-85g  Fluid:  1.7L/day  Tilda Franco, MS, RD, LDN Inpatient Clinical Dietitian Contact information available via Amion

## 2023-01-04 DIAGNOSIS — N179 Acute kidney failure, unspecified: Secondary | ICD-10-CM | POA: Diagnosis not present

## 2023-01-04 DIAGNOSIS — S3210XA Unspecified fracture of sacrum, initial encounter for closed fracture: Secondary | ICD-10-CM | POA: Diagnosis not present

## 2023-01-04 DIAGNOSIS — I5022 Chronic systolic (congestive) heart failure: Secondary | ICD-10-CM | POA: Diagnosis not present

## 2023-01-04 DIAGNOSIS — I10 Essential (primary) hypertension: Secondary | ICD-10-CM | POA: Diagnosis not present

## 2023-01-04 MED ORDER — HEPARIN SODIUM (PORCINE) 5000 UNIT/ML IJ SOLN
5000.0000 [IU] | Freq: Three times a day (TID) | INTRAMUSCULAR | Status: DC
  Administered 2023-01-04 – 2023-01-06 (×7): 5000 [IU] via SUBCUTANEOUS
  Filled 2023-01-04 (×7): qty 1

## 2023-01-04 NOTE — Progress Notes (Signed)
Physical Therapy Treatment Patient Details Name: Leslie [name] MRN: [id number] DOB: 1951-06-08 Today's Date: 01/04/2023   History of Present Illness 72 y.o. female with medical history significant of HFrEF with EF of 45%, atrial fibrillation on Eliquis, hypertension, obesity s/p gastric bypass, s/p nephrectomy, bil TKAs presented with a ground-level fall and severe lower back pain.  On presentation, hip x-ray showed osteopenia and degenerative changes.  CT of the lumbar spine without contrast showed acute mildly displaced fracture through the left S1 transverse process; acute nondisplaced fractures through the sacral alla bilaterally with moderate to severe spinal canal stenosis at L3-L4, L4-L5, L5 and S1.  Per neurosurgery note: "Pt may f/u on an outpt basis in regard to lumbar stenosis if becomes symptomatic. In regard to fractures, pt to participate in activity as tolerated. Consider IR consult for sacroplasty."  Current POC includes: IR "will consider sacroplasty if patient is still inpatient next week if sacroplasty gets approved by insurance.  Eliquis held on 01/03/2023"    PT Comments    Pt found in bed with feet over EOB as pt attempting to find comfortable position.  Pt agreeable to attempt mobilizing however only able to sit briefly EOB.  Sacral pain and shooting pain significantly limiting pt from mobilizing at this time.   Checked in with RN and pt had PO meds previously but able to have IV pain meds.  Pt also provided with one warm pack and one ice pack to trail for pain management, placed one on either side of low back.  Pt with multiple pillows in bed for positioning to comfort.  Pt pleasant and cooperative just very limited by pain.    Recommendations for follow up therapy are one component of a multi-disciplinary discharge planning process, led by the attending physician.  Recommendations may be updated based on patient status, additional functional criteria and insurance  authorization.  Follow Up Recommendations       Assistance Recommended at Discharge Intermittent Supervision/Assistance  Patient can return home with the following A little help with walking and/or transfers;A little help with bathing/dressing/bathroom;Help with stairs or ramp for entrance;Assist for transportation;Assistance with cooking/housework   Equipment Recommendations  None recommended by PT    Recommendations for Other Services       Precautions / Restrictions Precautions Precautions: Fall;Back Precaution Comments: back for comfort Restrictions Weight Bearing Restrictions: No     Mobility  Bed Mobility Overal bed mobility: Needs Assistance Bed Mobility: Supine to Sit, Sit to Supine     Supine to sit: Mod assist Sit to supine: Mod assist   General bed mobility comments: assist for trunk upright and then LEs onto bed for pain control, pt requested leaving pillows in place (under her hips) with attempts to mobilizing hoping to assist with pain management    Transfers                   General transfer comment: pt able to sit EOB but squirming in pain, felt unable to tolerate standing position today    Ambulation/Gait                   Stairs             Wheelchair Mobility    Modified Rankin (Stroke Patients Only)       Balance  Cognition Arousal/Alertness: Awake/alert Behavior During Therapy: WFL for tasks assessed/performed Overall Cognitive Status: Within Functional Limits for tasks assessed                                          Exercises      General Comments        Pertinent Vitals/Pain Pain Assessment Pain Assessment: 0-10 Pain Score: 10-Worst pain ever Pain Location: "V" shape in sacral area, shooting down posterior legs with mobilizing Pain Descriptors / Indicators: Shooting, Sharp Pain Intervention(s): Repositioned,  Premedicated before session, Monitored during session, Patient requesting pain meds-RN notified, Ice applied, Heat applied    Home Living                          Prior Function            PT Goals (current goals can now be found in the care plan section) Progress towards PT goals: Progressing toward goals    Frequency    Min 1X/week      PT Plan Current plan remains appropriate    Co-evaluation              AM-PAC PT "6 Clicks" Mobility   Outcome Measure  Help needed turning from your back to your side while in a flat bed without using bedrails?: A Little Help needed moving from lying on your back to sitting on the side of a flat bed without using bedrails?: A Lot Help needed moving to and from a bed to a chair (including a wheelchair)?: A Lot Help needed standing up from a chair using your arms (e.g., wheelchair or bedside chair)?: A Lot Help needed to walk in hospital room?: A Lot Help needed climbing 3-5 steps with a railing? : Total 6 Click Score: 12    End of Session Equipment Utilized During Treatment: Gait belt Activity Tolerance: Patient limited by pain Patient left: in bed;with call bell/phone within reach Nurse Communication: Mobility status;Patient requests pain meds PT Visit Diagnosis: Difficulty in walking, not elsewhere classified (R26.2)     Time: 1610-9604 PT Time Calculation (min) (ACUTE ONLY): 13 min  Charges:  $Therapeutic Activity: 8-22 mins                     Paulino Door, DPT Physical Therapist Acute Rehabilitation Services Office: [phone]  Kati L Payson 01/04/2023, 11:26 AM

## 2023-01-04 NOTE — Progress Notes (Signed)
PROGRESS NOTE    Leslie [name]  ZOX:[id number] DOB: 15-Apr-1951 DOA: 01/02/2023 PCP: Philip Aspen, Limmie Patricia, MD   Brief Narrative:   72 y.o. female with medical history significant of HFrEF with EF of 45%, atrial fibrillation on Eliquis, hypertension, obesity s/p gastric bypass, s/p nephrectomy presented with a ground-level fall and severe lower back pain.  On presentation, hip x-ray showed osteopenia and degenerative changes.  CT of the lumbar spine without contrast showed acute mildly displaced fracture through the left S1 transverse process; acute nondisplaced fractures through the sacral alla bilaterally with moderate to severe spinal canal stenosis at L3-L4, L4-L5, L5 and S1.  EDP spoke to on-call neurosurgery who recommended MRI of lumbar spine.  Assessment & Plan:   Sacral fracture Intractable back pain after mechanical fall -Imaging as above. -Continue pain management.  Fall precautions.  Bowel regimen -PT recommending SNF placement.  TOC consulted.  Moderate to severe lumbar spinal stenosis -CT findings as above.  MRI of lumbar spine showed severe L5-S1 spinal stenosis with moderate L4-L5 spinal stenosis and mild to moderate L3-L4 spinal stenosis. -Neurosurgery recommending outpatient follow-up and recommended IR evaluation for possible sacroplasty -IR consulted: Recommended to hold Eliquis for at least 3 days and they will consider sacroplasty if patient is still inpatient next week if sacroplasty gets approved by insurance.  Eliquis held on 01/03/2023  Acute kidney injury on chronic kidney disease stage IIIb -Creatinine 2.1 on presentation.  Improved to 1.5 on 01/03/2023.  Labs pending today.  Treated with IV fluids and subsequently discontinued.  Monitor.  Home losartan and spironolactone on hold.  Repeat a.m. labs.  Chronic systolic CHF Essential hypertension Hyperlipidemia -Volume A.  Strict input and output.  Daily weights.  Follows up with heart failure team as an  outpatient.  Continue Lasix, metoprolol and Farxiga.  Will keep losartan and spironolactone on hold for today as well.  Outpatient follow-up with heart failure team -Continue statin  Hyponatremia -Monitor.  No labs today  Hypokalemia -Resolved  Acute metabolic acidosis -Monitor.  Vitamin B12 deficiency -B12 176.  Started supplementation  Vitamin D deficiency -Started supplementation  Moderate malnutrition -Follow nutrition recommendations  Macrocytic anemia -Possibly from vitamin B12 deficiency.  Hemoglobin stable.  Thrombocytopenia -Questionable cause.  No labs today  Paroxysmal A-fib -Currently rate controlled.  Continue amiodarone and metoprolol.  Eliquis plan as above  Status post gastric bypass -Outpatient follow-up  Obesity -Outpatient follow-up   DVT prophylaxis: Apixaban Code Status: Full Family Communication: None at bedside Disposition Plan: Status is: inpatient because: Of need for SNF placement.  Continues to be in significant pain.   Consultants: Neurosurgery/IR  Procedures: None  Antimicrobials: None   Subjective: Patient seen and examined at bedside.  Still complains of significant lower back pain.  No abdominal pain, fever, vomiting reported. Objective: Vitals:   01/03/23 2221 01/04/23 0500 01/04/23 0504 01/04/23 0638  BP: 135/69  127/72   Pulse: 71  70 75  Resp: 17  18   Temp: 98.1 F (36.7 C)  98.3 F (36.8 C)   TempSrc: Oral  Oral   SpO2: 94%   98%  Weight:  90.3 kg    Height:        Intake/Output Summary (Last 24 hours) at 01/04/2023 0753 Last data filed at 01/04/2023 8119 Gross per 24 hour  Intake 960 ml  Output 1850 ml  Net -890 ml    Filed Weights   01/02/23 2238 01/03/23 0500 01/04/23 0500  Weight: 85.3 kg 86 kg 90.3  kg    Examination:  General: On room air.  No distress ENT/neck: No thyromegaly.  JVD is not elevated  respiratory: Decreased breath sounds at bases bilaterally with some crackles; no wheezing   CVS: S1-S2 heard, rate controlled currently Abdominal: Soft, nontender, slightly distended; no organomegaly, bowel sounds are heard Extremities: Trace lower extremity edema; no cyanosis  CNS: Awake and alert.  No focal neurologic deficit.  Moves extremities Lymph: No obvious lymphadenopathy Skin: No obvious ecchymosis/lesions  psych: Affect, judgment and mood are normal  musculoskeletal: No obvious joint swelling/deformity     Data Reviewed: I have personally reviewed following labs and imaging studies  CBC: Recent Labs  Lab 01/02/23 2148  WBC 5.5  NEUTROABS 4.7  HGB 10.3*  HCT 32.2*  MCV 103.9*  PLT 148*    Basic Metabolic Panel: Recent Labs  Lab 12/31/22 1227 01/02/23 2148  NA 133* 130*  K 5.3* 3.8  CL 100 102  CO2 21* 19*  GLUCOSE 86 93  BUN 52* 37*  CREATININE 2.21* 1.50*  CALCIUM 9.0 8.6*    GFR: Estimated Creatinine Clearance: 36.9 mL/min (A) (by C-G formula based on SCr of 1.5 mg/dL (H)). Liver Function Tests: No results for input(s): "AST", "ALT", "ALKPHOS", "BILITOT", "PROT", "ALBUMIN" in the last 168 hours. No results for input(s): "LIPASE", "AMYLASE" in the last 168 hours. No results for input(s): "AMMONIA" in the last 168 hours. Coagulation Profile: No results for input(s): "INR", "PROTIME" in the last 168 hours. Cardiac Enzymes: No results for input(s): "CKTOTAL", "CKMB", "CKMBINDEX", "TROPONINI" in the last 168 hours. BNP (last 3 results) Recent Labs    08/22/22 1125 08/30/22 1335 09/12/22 1437  PROBNP 6,931* 3,625* 1,775*    HbA1C: No results for input(s): "HGBA1C" in the last 72 hours. CBG: No results for input(s): "GLUCAP" in the last 168 hours. Lipid Profile: No results for input(s): "CHOL", "HDL", "LDLCALC", "TRIG", "CHOLHDL", "LDLDIRECT" in the last 72 hours. Thyroid Function Tests: No results for input(s): "TSH", "T4TOTAL", "FREET4", "T3FREE", "THYROIDAB" in the last 72 hours. Anemia Panel: Recent Labs    01/02/23 2148   VITAMINB12 176*    Sepsis Labs: No results for input(s): "PROCALCITON", "LATICACIDVEN" in the last 168 hours.  No results found for this or any previous visit (from the past 240 hour(s)).       Radiology Studies: MR LUMBAR SPINE WO CONTRAST  Result Date: 01/02/2023 CLINICAL DATA:  Fall, back pain, sacral fractures EXAM: MRI LUMBAR SPINE WITHOUT CONTRAST TECHNIQUE: Multiplanar, multisequence MR imaging of the lumbar spine was performed. No intravenous contrast was administered. COMPARISON:  01/02/2023 CT lumbar spine, 06/04/2021 MRI lumbar spine FINDINGS: Segmentation: In keeping with the same day CT lumbar spine and in correlation with the 08/07/2022 CT chest, there are 6 non rib-bearing vertebral bodies, the last of which is labeled S1, which is lumbarized. Alignment:  5 mm anterolisthesis of L5 on S1. Vertebrae: Increased T2 signal in the left sacral ala (series 6, image 13), and in the central sacrum (series 6, image 9), consistent with the fractures that are better seen on the same-day CT lumbar spine. No acute fracture in the lumbar spine. Conus medullaris and cauda equina: Conus extends to the L1-L2 level. Conus and cauda equina appear normal. Paraspinal and other soft tissues: Small renal cysts, for which no follow-up is currently indicated. The left kidney is likely surgically absent. No lymphadenopathy. Disc levels: T12-L1: Seen only on the sagittal images. No significant disc bulge, spinal canal stenosis, or neural foraminal narrowing. L1-L2: Minimal  disc bulge. No spinal canal stenosis or neural foraminal narrowing. L2-L3: Minimal disc bulge. No spinal canal stenosis or neural foraminal narrowing. L3-L4: Mild disc bulge. Mild facet arthropathy. Ligamentum flavum hypertrophy. Narrowing of the lateral recesses. Mild to moderate spinal canal stenosis. Mild right neural foraminal narrowing. L4-L5: Moderate disc bulge. Moderate facet arthropathy. Ligamentum flavum hypertrophy. Narrowing of the  lateral recesses. Moderate spinal canal stenosis. No neural foraminal narrowing. L5-S1: Grade 1 anterolisthesis with disc unroofing. Moderate disc bulge. Severe facet arthropathy. Ligamentum flavum hypertrophy. Effacement of the lateral recesses. Severe spinal canal stenosis. No neural foraminal narrowing. S1-S2: Minimal disc bulge with right greater than left lateral disc osteophyte complex, which may contact the exiting right S1 nerve. Mild facet arthropathy. No spinal canal stenosis or neural foraminal narrowing. IMPRESSION: 1. Sacral fractures are better seen on the same-day CT lumbar spine. No acute fracture in the lumbar spine. 2. L5-S1 severe spinal canal stenosis. Effacement of the lateral recesses at this level likely compresses the descending S1 nerve roots. 3. L4-L5 moderate spinal canal stenosis. Narrowing of the lateral recesses at this level could affect the descending L5 nerve roots. 4. L3-L4 mild to moderate spinal canal stenosis and mild right neural foraminal narrowing. Narrowing of the lateral recesses at this level could affect the descending L4 nerve roots. Electronically Signed   By: Wiliam Ke M.D.   On: 01/02/2023 23:16   CT Lumbar Spine Wo Contrast  Result Date: 01/02/2023 CLINICAL DATA:  Low back pain EXAM: CT LUMBAR SPINE WITHOUT CONTRAST TECHNIQUE: Multidetector CT imaging of the lumbar spine was performed without intravenous contrast administration. Multiplanar CT image reconstructions were also generated. RADIATION DOSE REDUCTION: This exam was performed according to the departmental dose-optimization program which includes automated exposure control, adjustment of the mA and/or kV according to patient size and/or use of iterative reconstruction technique. COMPARISON:  MR L Spine 06/04/21 FINDINGS: Segmentation: 6 non-rib-bearing vertebral bodies with lumbarization of S1. Alignment: Grade 1 anterolisthesis of L5 on S1. Vertebrae: Mildly displaced fracture through the left S1  transverse process (series 5, image 44). There are nondisplaced fractures through the sacral ala bilaterally extending into the bilateral SI joints (series 3, image 109, 110). Paraspinal and other soft tissues: Aortic atherosclerotic calcifications. 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 Left kidney is not visualized. Disc levels: Moderate to severe spinal canal stenosis at L3-L4, L4-L5, and L5-S1. 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 1. Acute mildly displaced fracture through the left S1 transverse process. 2. Acute nondisplaced fractures through the sacral ala bilaterally extending into the bilateral SI joints. 3. Moderate to severe spinal canal stenosis at L3-L4, L4-L5, and L5-S1. Stable to slightly progressed compared to 2022. Aortic Atherosclerosis (ICD10-I70.0). Electronically Signed   By: Lorenza Cambridge M.D.   On: 01/02/2023 17:03   DG Hip Unilat W or Wo Pelvis 2-3 Views Left  Result Date: 01/02/2023 CLINICAL DATA:  Pain after fall EXAM: DG HIP (WITH OR WITHOUT PELVIS) 3V LEFT COMPARISON:  None Available. FINDINGS: Osteopenia. Preserved joint spaces. Hyperostosis. No obvious fracture. Mild degenerative changes of the sacroiliac joints. Soft tissue calcifications along the upper medial thigh regions bilaterally, nonspecific. With this level of osteopenia evaluation for subtle nondisplaced injury is difficult to completely exclude and if needed cross-sectional imaging as clinically appropriate. IMPRESSION: Osteopenia with degenerative changes. Electronically Signed   By: Karen Kays M.D.   On: 01/02/2023 16:33        Scheduled Meds:  acetaminophen  650  mg Oral Q6H   ALPRAZolam  0.25 mg Oral QHS   amiodarone  200 mg Oral Daily   atorvastatin  40 mg Oral Daily   cyanocobalamin  1,000 mcg Intramuscular Q0600   dapagliflozin propanediol  10 mg Oral QAC breakfast   feeding supplement ([employer] STANDARD 1.4)   325 mL Oral Daily   FLUoxetine  40 mg Oral q AM   folic acid  1 mg Oral Daily   furosemide  40 mg Oral Daily   lamoTRIgine  100 mg Oral BID   metoprolol succinate  50 mg Oral QHS   multivitamin with minerals  1 tablet Oral Daily   thiamine  100 mg Oral Daily   Vitamin D (Ergocalciferol)  50,000 Units Oral Q7 days   Continuous Infusions:        Glade Lloyd, MD [facility] 01/04/2023, 7:53 AM

## 2023-01-04 NOTE — Evaluation (Signed)
Occupational Therapy Evaluation Patient Details Name: Leslie [name] MRN: [id number] DOB: 08/17/1950 Today's Date: 01/04/2023   History of Present Illness 72 y.o. female with medical history significant of HFrEF with EF of 45%, atrial fibrillation on Eliquis, hypertension, obesity s/p gastric bypass, s/p nephrectomy, bil TKAs presented with a ground-level fall and severe lower back pain.  On presentation, hip x-ray showed osteopenia and degenerative changes.  CT of the lumbar spine without contrast showed acute mildly displaced fracture through the left S1 transverse process; acute nondisplaced fractures through the sacral alla bilaterally with moderate to severe spinal canal stenosis at L3-L4, L4-L5, L5 and S1.  Per neurosurgery note: "Pt may f/u on an outpt basis in regard to lumbar stenosis if becomes symptomatic. In regard to fractures, pt to participate in activity as tolerated. Consider IR consult for sacroplasty."  Current POC includes: IR "will consider sacroplasty if patient is still inpatient next week if sacroplasty gets approved by insurance.  Eliquis held on 01/03/2023"   Clinical Impression   Pt found in bed with feet over R side EOB as pt attempting to find comfortable position.  Pt agreeable to attempt mobilizing however only able to sit briefly EOB.  Sacral pain and shooting pain significantly limiting pt from mobilizing at this time.    Pt pleasant and cooperative just very limited by pain. Unable to perform grooming task EOB due to pain in sitting        Recommendations for follow up therapy are one component of a multi-disciplinary discharge planning process, led by the attending physician.  Recommendations may be updated based on patient status, additional functional criteria and insurance authorization.   Assistance Recommended at Discharge Frequent or constant Supervision/Assistance  Patient can return home with the following A lot of help with walking and/or transfers;A lot  of help with bathing/dressing/bathroom    Functional Status Assessment  Patient has had a recent decline in their functional status and demonstrates the ability to make significant improvements in function in a reasonable and predictable amount of time.  Equipment Recommendations  BSC/3in1       Precautions / Restrictions Precautions Precautions: Fall;Back Precaution Comments: back for comfort      Mobility Bed Mobility Overal bed mobility: Needs Assistance Bed Mobility: Supine to Sit, Sit to Supine     Supine to sit: Mod assist Sit to supine: Max assist   General bed mobility comments: assist for trunk upright and then LEs onto bed for pain control    Transfers                   General transfer comment: did not stand      Balance Overall balance assessment: History of Falls                                         ADL either performed or assessed with clinical judgement   ADL Overall ADL's : Needs assistance/impaired     Grooming: Wash/dry face;Bed level;Set up                                 General ADL Comments: very limited ADL eval due to pain in sitting. Pt able to perform bed level but in sitting not able to do anything     Vision   Vision Assessment?: No apparent visual  deficits     Perception     Praxis      Pertinent Vitals/Pain Pain Assessment Pain Score: 10-Worst pain ever Pain Location: with si tting.  pain imediately in supine Pain Descriptors / Indicators: Shooting, Sharp Pain Intervention(s): Limited activity within patient's tolerance, Monitored during session, Premedicated before session, Repositioned     Hand Dominance     Extremity/Trunk Assessment Upper Extremity Assessment Upper Extremity Assessment: Generalized weakness           Communication Communication Communication: No difficulties   Cognition Arousal/Alertness: Awake/alert Behavior During Therapy: WFL for tasks  assessed/performed Overall Cognitive Status: Within Functional Limits for tasks assessed                                                  Home Living Family/patient expects to be discharged to:: Private residence Living Arrangements: Alone Available Help at Discharge: Available PRN/intermittently;Family Type of Home: House Home Access: Stairs to enter [employer] of Steps: 1   Home Layout: One level     Bathroom Shower/Tub: Producer, television/film/video: Standard     Home Equipment: Agricultural consultant (2 wheels);Transport chair          Prior Functioning/Environment Prior Level of Function : Independent/Modified Independent             Mobility Comments: was using RW prior to admission          OT Problem List: Decreased strength;Decreased safety awareness;Impaired balance (sitting and/or standing);Decreased activity tolerance;Pain      OT Treatment/Interventions: Self-care/ADL training;DME and/or AE instruction;Therapeutic activities    OT Goals(Current goals can be found in the care plan section) Acute Rehab OT Goals Patient Stated Goal: less pain OT Goal Formulation: With patient Time For Goal Achievement: 01/18/23 Potential to Achieve Goals: Good ADL Goals Pt Will Perform Grooming: with set-up;sitting Pt Will Perform Lower Body Dressing: with min guard assist;sit to/from stand Pt Will Transfer to Toilet: with min guard assist;bedside commode Pt Will Perform Toileting - Clothing Manipulation and hygiene: with min guard assist;sit to/from stand;sitting/lateral leans  OT Frequency: Min 2X/week       AM-PAC OT "6 Clicks" Daily Activity     Outcome Measure Help from another person eating meals?: A Little Help from another person taking care of personal grooming?: A Little Help from another person toileting, which includes using toliet, bedpan, or urinal?: Total Help from another person bathing (including washing, rinsing,  drying)?: Total Help from another person to put on and taking off regular upper body clothing?: Total Help from another person to put on and taking off regular lower body clothing?: Total 6 Click Score: 10   End of Session Nurse Communication: Mobility status  Activity Tolerance: Patient limited by pain Patient left: in bed;with call bell/phone within reach;with family/visitor present  OT Visit Diagnosis: Pain;Muscle weakness (generalized) (M62.81);Repeated falls (R29.6);History of falling (Z91.81) Pain - Right/Left: Left Pain - part of body: Leg (back/leg)                Time: 1610-9604 OT Time Calculation (min): 15 min Charges:  OT General Charges $OT Visit: 1 Visit OT Evaluation $OT Eval Low Complexity: 1 Low  Lise Auer, OT Acute Rehabilitation Services  [phone]     Einar Crow D 01/04/2023, 4:04 PM

## 2023-01-05 DIAGNOSIS — S3210XA Unspecified fracture of sacrum, initial encounter for closed fracture: Secondary | ICD-10-CM | POA: Diagnosis not present

## 2023-01-05 LAB — CBC WITH DIFFERENTIAL/PLATELET
Abs Immature Granulocytes: 0.03 10*3/uL (ref 0.00–0.07)
Basophils Absolute: 0 10*3/uL (ref 0.0–0.1)
Basophils Relative: 1 %
Eosinophils Absolute: 0.3 10*3/uL (ref 0.0–0.5)
Eosinophils Relative: 6 %
HCT: 33.5 % — ABNORMAL LOW (ref 36.0–46.0)
Hemoglobin: 10.7 g/dL — ABNORMAL LOW (ref 12.0–15.0)
Immature Granulocytes: 1 %
Lymphocytes Relative: 11 %
Lymphs Abs: 0.6 10*3/uL — ABNORMAL LOW (ref 0.7–4.0)
MCH: 33.5 pg (ref 26.0–34.0)
MCHC: 31.9 g/dL (ref 30.0–36.0)
MCV: 105 fL — ABNORMAL HIGH (ref 80.0–100.0)
Monocytes Absolute: 0.5 10*3/uL (ref 0.1–1.0)
Monocytes Relative: 9 %
Neutro Abs: 4 10*3/uL (ref 1.7–7.7)
Neutrophils Relative %: 72 %
Platelets: 172 10*3/uL (ref 150–400)
RBC: 3.19 MIL/uL — ABNORMAL LOW (ref 3.87–5.11)
RDW: 12.5 % (ref 11.5–15.5)
WBC: 5.4 10*3/uL (ref 4.0–10.5)
nRBC: 0 % (ref 0.0–0.2)

## 2023-01-05 LAB — BASIC METABOLIC PANEL
Anion gap: 11 (ref 5–15)
BUN: 31 mg/dL — ABNORMAL HIGH (ref 8–23)
CO2: 23 mmol/L (ref 22–32)
Calcium: 8.9 mg/dL (ref 8.9–10.3)
Chloride: 97 mmol/L — ABNORMAL LOW (ref 98–111)
Creatinine, Ser: 1.39 mg/dL — ABNORMAL HIGH (ref 0.44–1.00)
GFR, Estimated: 40 mL/min — ABNORMAL LOW (ref >60)
Glucose, Bld: 101 mg/dL — ABNORMAL HIGH (ref 70–99)
Potassium: 4.6 mmol/L (ref 3.5–5.1)
Sodium: 131 mmol/L — ABNORMAL LOW (ref 135–145)

## 2023-01-05 LAB — MAGNESIUM: Magnesium: 2 mg/dL (ref 1.7–2.4)

## 2023-01-05 MED ORDER — HYDROMORPHONE HCL 1 MG/ML IJ SOLN
1.0000 mg | INTRAMUSCULAR | Status: DC | PRN
  Administered 2023-01-05 – 2023-01-08 (×14): 1 mg via INTRAVENOUS
  Filled 2023-01-05 (×14): qty 1

## 2023-01-05 MED ORDER — LIDOCAINE 5 % EX PTCH
1.0000 | MEDICATED_PATCH | CUTANEOUS | Status: DC
  Administered 2023-01-05: 1 via TRANSDERMAL
  Filled 2023-01-05: qty 1

## 2023-01-05 MED ORDER — HYDROCODONE-ACETAMINOPHEN 5-325 MG PO TABS
1.0000 | ORAL_TABLET | ORAL | Status: DC | PRN
  Administered 2023-01-05 – 2023-01-09 (×18): 2 via ORAL
  Filled 2023-01-05 (×20): qty 2

## 2023-01-05 MED ORDER — LIDOCAINE 5 % EX PTCH
2.0000 | MEDICATED_PATCH | CUTANEOUS | Status: DC
  Administered 2023-01-06 – 2023-01-09 (×4): 2 via TRANSDERMAL
  Filled 2023-01-05 (×4): qty 2

## 2023-01-05 NOTE — Plan of Care (Signed)
  Problem: Coping: Goal: Level of anxiety will decrease Outcome: Progressing   Problem: Pain Managment: Goal: General experience of comfort will improve Outcome: Progressing   Problem: Safety: Goal: Ability to remain free from injury will improve Outcome: Progressing   

## 2023-01-05 NOTE — Progress Notes (Signed)
PROGRESS NOTE    Leslie [name]  ZOX:[id number] DOB: 1950/12/30 DOA: 01/02/2023 PCP: Philip Aspen, Limmie Patricia, MD   Brief Narrative:   72 y.o. female with medical history significant of HFrEF with EF of 45%, atrial fibrillation on Eliquis, hypertension, obesity s/p gastric bypass, s/p nephrectomy presented with a ground-level fall and severe lower back pain.  On presentation, hip x-ray showed osteopenia and degenerative changes.  CT of the lumbar spine without contrast showed acute mildly displaced fracture through the left S1 transverse process; acute nondisplaced fractures through the sacral alla bilaterally with moderate to severe spinal canal stenosis at L3-L4, L4-L5, L5 and S1.  EDP spoke to on-call neurosurgery who recommended MRI of lumbar spine.  PT recommended SNF placement.  Assessment & Plan:   Sacral fracture Intractable back pain after mechanical fall -Imaging as above. -Continue pain management.  Fall precautions.  Bowel regimen -PT recommending SNF placement.  TOC consulted.  Moderate to severe lumbar spinal stenosis -CT findings as above.  MRI of lumbar spine showed severe L5-S1 spinal stenosis with moderate L4-L5 spinal stenosis and mild to moderate L3-L4 spinal stenosis. -Neurosurgery recommending outpatient follow-up and recommended IR evaluation for possible sacroplasty -IR consulted: Recommended to hold Eliquis for at least 3 days and they will consider sacroplasty if patient is still inpatient next week if sacroplasty gets approved by insurance.  Eliquis held on 01/03/2023  Acute kidney injury on chronic kidney disease stage IIIb -Creatinine 2.1 on presentation.  Improved to 1.39 today.  Treated with IV fluids and subsequently discontinued.  Monitor.  Home losartan and spironolactone on hold.  Repeat a.m. labs.  Chronic systolic CHF Essential hypertension Hyperlipidemia -Volume A.  Strict input and output.  Daily weights.  Follows up with heart failure team as  an outpatient.  Continue Lasix, metoprolol and Farxiga.  Will keep losartan and spironolactone on hold for today as well.  Outpatient follow-up with heart failure team -Continue statin  Hyponatremia -Mild.  Repeat a.m. labs.  Hypokalemia -Resolved  Acute metabolic acidosis -Improved.  Vitamin B12 deficiency -B12 176.  Continue supplementation  Vitamin D deficiency -Continue supplementation  Moderate malnutrition -Follow nutrition recommendations  Macrocytic anemia -Possibly from vitamin B12 deficiency.  Hemoglobin stable.  Thrombocytopenia -Questionable cause.  No labs today  Paroxysmal A-fib -Currently rate controlled.  Continue amiodarone and metoprolol.  Eliquis plan as above  Status post gastric bypass -Outpatient follow-up  Obesity -Outpatient follow-up   DVT prophylaxis: Apixaban Code Status: Full Family Communication: None at bedside Disposition Plan: Status is: inpatient because: Of need for SNF placement.  Continues to be in significant pain.   Consultants: Neurosurgery/IR  Procedures: None  Antimicrobials: None   Subjective: Patient seen and examined at bedside.  No fever, worsening shortness of breath, abdominal pain reported.  Still complains of intermittent lower back pain.   Objective: Vitals:   01/04/23 2026 01/04/23 2103 01/05/23 0500 01/05/23 0603  BP:  (!) 142/70  106/72  Pulse: 78 66  (!) 57  Resp:  17  17  Temp:  98.6 F (37 C)  (!) 97.3 F (36.3 C)  TempSrc:  Oral  Oral  SpO2:  95%  92%  Weight:   90.2 kg   Height:        Intake/Output Summary (Last 24 hours) at 01/05/2023 0751 Last data filed at 01/05/2023 0200 Gross per 24 hour  Intake 780 ml  Output 2050 ml  Net -1270 ml    Filed Weights   01/03/23 0500 01/04/23 0500 01/05/23  0500  Weight: 86 kg 90.3 kg 90.2 kg    Examination:  General: No acute distress currently.  Still on room air  ENT/neck: No obvious JVD elevation or palpable neck masses noted   respiratory: Bilateral decreased breath sounds at bases with scattered crackles  CVS: Mild intermittent bradycardia present; S1 and S2 are heard  abdominal: Soft, nontender, distended mildly; no organomegaly, normal bowel sounds heard  extremities: No clubbing; mild lower extremity edema present CNS: Alert and oriented.  No focal neurologic deficit.  Able to move extremities Lymph: No palpable lymphadenopathy Skin: No obvious rashes/petechiae  psych: Mood and affect are normal  musculoskeletal: No obvious joint erythema/tenderness    Data Reviewed: I have personally reviewed following labs and imaging studies  CBC: Recent Labs  Lab 01/02/23 2148 01/05/23 0332  WBC 5.5 5.4  NEUTROABS 4.7 4.0  HGB 10.3* 10.7*  HCT 32.2* 33.5*  MCV 103.9* 105.0*  PLT 148* 172    Basic Metabolic Panel: Recent Labs  Lab 12/31/22 1227 01/02/23 2148 01/05/23 0332  NA 133* 130* 131*  K 5.3* 3.8 4.6  CL 100 102 97*  CO2 21* 19* 23  GLUCOSE 86 93 101*  BUN 52* 37* 31*  CREATININE 2.21* 1.50* 1.39*  CALCIUM 9.0 8.6* 8.9  MG  --   --  2.0    GFR: Estimated Creatinine Clearance: 39.8 mL/min (A) (by C-G formula based on SCr of 1.39 mg/dL (H)). Liver Function Tests: No results for input(s): "AST", "ALT", "ALKPHOS", "BILITOT", "PROT", "ALBUMIN" in the last 168 hours. No results for input(s): "LIPASE", "AMYLASE" in the last 168 hours. No results for input(s): "AMMONIA" in the last 168 hours. Coagulation Profile: No results for input(s): "INR", "PROTIME" in the last 168 hours. Cardiac Enzymes: No results for input(s): "CKTOTAL", "CKMB", "CKMBINDEX", "TROPONINI" in the last 168 hours. BNP (last 3 results) Recent Labs    08/22/22 1125 08/30/22 1335 09/12/22 1437  PROBNP 6,931* 3,625* 1,775*    HbA1C: No results for input(s): "HGBA1C" in the last 72 hours. CBG: No results for input(s): "GLUCAP" in the last 168 hours. Lipid Profile: No results for input(s): "CHOL", "HDL", "LDLCALC",  "TRIG", "CHOLHDL", "LDLDIRECT" in the last 72 hours. Thyroid Function Tests: No results for input(s): "TSH", "T4TOTAL", "FREET4", "T3FREE", "THYROIDAB" in the last 72 hours. Anemia Panel: Recent Labs    01/02/23 2148  VITAMINB12 176*    Sepsis Labs: No results for input(s): "PROCALCITON", "LATICACIDVEN" in the last 168 hours.  No results found for this or any previous visit (from the past 240 hour(s)).       Radiology Studies: No results found.      Scheduled Meds:  acetaminophen  650 mg Oral Q6H   ALPRAZolam  0.25 mg Oral QHS   amiodarone  200 mg Oral Daily   atorvastatin  40 mg Oral Daily   cyanocobalamin  1,000 mcg Intramuscular Q0600   dapagliflozin propanediol  10 mg Oral QAC breakfast   feeding supplement ([employer] STANDARD 1.4)  325 mL Oral Daily   FLUoxetine  40 mg Oral q AM   folic acid  1 mg Oral Daily   furosemide  40 mg Oral Daily   heparin injection (subcutaneous)  5,000 Units Subcutaneous Q8H   lamoTRIgine  100 mg Oral BID   metoprolol succinate  50 mg Oral QHS   multivitamin with minerals  1 tablet Oral Daily   thiamine  100 mg Oral Daily   Vitamin D (Ergocalciferol)  50,000 Units Oral Q7 days  Continuous Infusions:        Glade Lloyd, MD [facility] 01/05/2023, 7:51 AM

## 2023-01-06 DIAGNOSIS — S3210XA Unspecified fracture of sacrum, initial encounter for closed fracture: Secondary | ICD-10-CM | POA: Diagnosis not present

## 2023-01-06 LAB — BASIC METABOLIC PANEL
Anion gap: 11 (ref 5–15)
BUN: 37 mg/dL — ABNORMAL HIGH (ref 8–23)
CO2: 25 mmol/L (ref 22–32)
Calcium: 9.1 mg/dL (ref 8.9–10.3)
Chloride: 93 mmol/L — ABNORMAL LOW (ref 98–111)
Creatinine, Ser: 1.49 mg/dL — ABNORMAL HIGH (ref 0.44–1.00)
GFR, Estimated: 37 mL/min — ABNORMAL LOW (ref >60)
Glucose, Bld: 107 mg/dL — ABNORMAL HIGH (ref 70–99)
Potassium: 4.2 mmol/L (ref 3.5–5.1)
Sodium: 129 mmol/L — ABNORMAL LOW (ref 135–145)

## 2023-01-06 LAB — MAGNESIUM: Magnesium: 2 mg/dL (ref 1.7–2.4)

## 2023-01-06 MED ORDER — METHOCARBAMOL 500 MG PO TABS
500.0000 mg | ORAL_TABLET | Freq: Four times a day (QID) | ORAL | Status: DC | PRN
  Administered 2023-01-06 – 2023-01-09 (×9): 500 mg via ORAL
  Filled 2023-01-06 (×9): qty 1

## 2023-01-06 MED ORDER — SENNOSIDES-DOCUSATE SODIUM 8.6-50 MG PO TABS
1.0000 | ORAL_TABLET | Freq: Two times a day (BID) | ORAL | Status: DC
  Administered 2023-01-06 – 2023-01-09 (×7): 1 via ORAL
  Filled 2023-01-06 (×7): qty 1

## 2023-01-06 MED ORDER — HEPARIN SODIUM (PORCINE) 5000 UNIT/ML IJ SOLN
5000.0000 [IU] | Freq: Three times a day (TID) | INTRAMUSCULAR | Status: DC
  Administered 2023-01-07 – 2023-01-08 (×2): 5000 [IU] via SUBCUTANEOUS
  Filled 2023-01-06 (×2): qty 1

## 2023-01-06 MED ORDER — BISACODYL 10 MG RE SUPP: 10.0000 mg | Freq: Every day | RECTAL | Status: DC | PRN

## 2023-01-06 MED ORDER — VANCOMYCIN HCL IN DEXTROSE 1-5 GM/200ML-% IV SOLN
1000.0000 mg | INTRAVENOUS | Status: AC
  Filled 2023-01-06: qty 200

## 2023-01-06 MED ORDER — POLYETHYLENE GLYCOL 3350 17 G PO PACK
17.0000 g | PACK | Freq: Every day | ORAL | Status: DC | PRN
  Administered 2023-01-07 – 2023-01-08 (×2): 17 g via ORAL
  Filled 2023-01-06 (×3): qty 1

## 2023-01-06 NOTE — Progress Notes (Signed)
Physical Therapy Treatment Patient Details Name: Leslie [name] MRN: [id number] DOB: 18-Apr-1951 Today's Date: 01/06/2023   History of Present Illness 72 y.o. female with medical history significant of HFrEF with EF of 45%, atrial fibrillation on Eliquis, hypertension, obesity s/p gastric bypass, s/p nephrectomy, bil TKAs presented with a ground-level fall and severe lower back pain.  On presentation, hip x-ray showed osteopenia and degenerative changes.  CT of the lumbar spine without contrast showed acute mildly displaced fracture through the left S1 transverse process; acute nondisplaced fractures through the sacral alla bilaterally with moderate to severe spinal canal stenosis at L3-L4, L4-L5, L5 and S1.  Per neurosurgery note: "Pt may f/u on an outpt basis in regard to lumbar stenosis if becomes symptomatic. In regard to fractures, pt to participate in activity as tolerated. Consider IR consult for sacroplasty."  Current POC includes: IR "will consider sacroplasty if patient is still inpatient next week if sacroplasty gets approved by insurance.  Eliquis held on 01/03/2023"    PT Comments    Pt agreeable to exercises only d/t pain; reports being up to chair with nursing staff, pain increased. Pt was medicated prior to PT session. After LE exercises pt performed partial roll R and L with mod assist to place pillows under R/L glutes to off load sacrum. Pt reported slight relief. Pt remains limited by pain, d/c plan appropriate.   Recommendations for follow up therapy are one component of a multi-disciplinary discharge planning process, led by the attending physician.  Recommendations may be updated based on patient status, additional functional criteria and insurance authorization.  Follow Up Recommendations  Can patient physically be transported by private vehicle: No    Assistance Recommended at Discharge Intermittent Supervision/Assistance  Patient can return home with the following A  little help with walking and/or transfers;A little help with bathing/dressing/bathroom;Help with stairs or ramp for entrance;Assist for transportation;Assistance with cooking/housework   Equipment Recommendations  None recommended by PT    Recommendations for Other Services       Precautions / Restrictions Precautions Precautions: Fall;Back Precaution Comments: back for comfort Restrictions Weight Bearing Restrictions: No     Mobility  Bed Mobility               General bed mobility comments: pt declined OOB d/t pain; reports she was in chair earlier bc she thought it may help pain however it did not.    Transfers                        Ambulation/Gait                   Stairs             Wheelchair Mobility    Modified Rankin (Stroke Patients Only)       Balance                                            Cognition Arousal/Alertness: Awake/alert Behavior During Therapy: WFL for tasks assessed/performed Overall Cognitive Status: Within Functional Limits for tasks assessed                                 General Comments: pt reports numbness in bil feet since sacral injury        Exercises  General Exercises - Lower Extremity Ankle Circles/Pumps: AROM, Both, 15 reps Quad Sets: AROM, Strengthening, Both, 10 reps Heel Slides: AROM, AAROM, Both, 10 reps, Limitations Heel Slides Limitations: ltd to 5 reps on L d/t pain Hip ABduction/ADduction: AROM, Right, 10 reps    General Comments        Pertinent Vitals/Pain Pain Assessment Pain Assessment: 0-10 Pain Score: 10-Worst pain ever Pain Location: L sacral area, L side, feet-numb Pain Descriptors / Indicators: Grimacing, Guarding, Jabbing Pain Intervention(s): Limited activity within patient's tolerance, Monitored during session, Premedicated before session, Repositioned    Home Living                          Prior Function             PT Goals (current goals can now be found in the care plan section) Acute Rehab PT Goals PT Goal Formulation: With patient Time For Goal Achievement: 01/17/23 Potential to Achieve Goals: Good Progress towards PT goals: Progressing toward goals    Frequency    Min 1X/week      PT Plan Current plan remains appropriate    Co-evaluation              AM-PAC PT "6 Clicks" Mobility   Outcome Measure  Help needed turning from your back to your side while in a flat bed without using bedrails?: A Lot Help needed moving from lying on your back to sitting on the side of a flat bed without using bedrails?: Total Help needed moving to and from a bed to a chair (including a wheelchair)?: Total Help needed standing up from a chair using your arms (e.g., wheelchair or bedside chair)?: Total Help needed to walk in hospital room?: Total Help needed climbing 3-5 steps with a railing? : Total 6 Click Score: 7    End of Session   Activity Tolerance: Patient limited by pain Patient left: with call bell/phone within reach;in bed;with bed alarm set;Other (comment) (rails per pt x4)   PT Visit Diagnosis: Difficulty in walking, not elsewhere classified (R26.2)     Time: 4540-9811 PT Time Calculation (min) (ACUTE ONLY): 12 min  Charges:  $Therapeutic Exercise: 8-22 mins                     Delice Bison, PT  Acute Rehab Dept [facility]) [phone]  01/06/2023    [facility] 01/06/2023, 11:41 AM

## 2023-01-06 NOTE — TOC Progression Note (Addendum)
Transition of Care [facility]) - Progression Note   Patient Details  Name: Leslie [name] MRN: [id number] Date of Birth: [birth year]  [facility] of [facility]) CM/SW Contact  Ewing Schlein, LCSW Phone Number: 01/06/2023, 10:39 AM  Clinical Narrative: PT and OT evaluations recommended SNF. Patient is agreeable to short-term rehab. FL2 done; PASRR pending. Clinical documentation uploaded to [facility] MUST for review. Initial referral faxed out for review. TOC awaiting bed offers and PASRR.  AddendumCherlyn Roberts received: [id number] A.  Expected Discharge Plan: Skilled Nursing Facility Barriers to Discharge: Continued Medical Work up, SNF Pending bed offer, English as a second language teacher  Expected Discharge Plan and Services In-house Referral: Clinical Social Work Post Acute Care Choice: Skilled Nursing Facility Living arrangements for the past 2 months: Single Family Home             DME Arranged: N/A DME Agency: NA  Social Determinants of Health (SDOH) Interventions SDOH Screenings   Food Insecurity: No Food Insecurity (01/03/2023)  Housing: Low Risk  (01/03/2023)  Transportation Needs: No Transportation Needs (01/03/2023)  Utilities: Not At Risk (01/03/2023)  Alcohol Screen: Low Risk  (09/20/2022)  Depression (PHQ2-9): Low Risk  (06/17/2022)  Financial Resource Strain: Low Risk  (09/20/2022)  Physical Activity: Unknown (10/04/2021)  Social Connections: Unknown (10/04/2021)  Tobacco Use: Medium Risk (01/02/2023)   Readmission Risk Interventions     No data to display

## 2023-01-06 NOTE — Plan of Care (Signed)
  Problem: Coping: Goal: Level of anxiety will decrease Outcome: Progressing   Problem: Pain Managment: Goal: General experience of comfort will improve Outcome: Progressing   

## 2023-01-06 NOTE — Consult Note (Signed)
Chief Complaint: Patient was seen in consultation today for bilateral sacroplasty Chief Complaint  Patient presents with   Fall   Back Pain     Referring Physician(s): Alekh,K  Supervising Physician: Baldemar Lenis  Patient Status: [facility] - In-pt  History of Present Illness: Leslie [name] is a 72 y.o. female, ex smoker, with past medical history significant for ADD, anemia, osteoarthritis, hypertension, heart failure, atrial fibrillation on Eliquis, hypertension, hyperlipidemia , vitamin B12/D deficiency, obesity with prior gastric bypass 2003, prior nephrectomy who was admitted to [facility] on 6/13 following ground-level fall with subsequent severe lower back pain. Hip x-ray showed osteopenia and degenerative changes. CT of the lumbar spine without contrast showed acute mildly displaced fracture through the left S1 transverse process; acute nondisplaced fractures through the sacral alla bilaterally with moderate to severe spinal canal stenosis at L3-L4, L4-L5, L5 and S1 .Patient continues to have severe lower back/sacral pain despite IV/p.o. narcotics with radiation of pain down both lower extremities, greater on the left.  Pain exacerbated by movement.  Request now received for consideration of bilateral sacroplasty.   Past Medical History:  Diagnosis Date   ADD (attention deficit disorder)    Allergy    Anemia    Arthritis 07/22/2014   osteoarthritis left knee   Colon polyps    Hypertension    Left knee pain    chronic   Narcotic addiction (HCC)    Obesity    post gastric bypass   Vitamin D deficiency     Past Surgical History:  Procedure Laterality Date   arthscopic knee surgery Left    several times   BREAST BIOPSY     BREAST SURGERY  years ago   breast biopsy   CHOLECYSTECTOMY     EYE SURGERY Bilateral 2013   Toric Lens implants   EYE SURGERY Right 2014   corneal amniotic membrane   GASTRIC BYPASS  2003   KIDNEY DONATION  2004    TOTAL KNEE ARTHROPLASTY Left 06/16/2015   Procedure: LEFT TOTAL KNEE ARTHROPLASTY;  Surgeon: Kathryne Hitch, MD;  Location: [facility];  Service: Orthopedics;  Laterality: Left;   TOTAL KNEE ARTHROPLASTY Right 05/04/2021   Procedure: RIGHT TOTAL KNEE ARTHROPLASTY;  Surgeon: Kathryne Hitch, MD;  Location: [facility];  Service: Orthopedics;  Laterality: Right;    Allergies: Amoxicillin and Sulfamethoxazole  Medications: Prior to Admission medications   Medication Sig Start Date End Date Taking? Authorizing Provider  acetaminophen (TYLENOL) 500 MG tablet Take 1,000 mg by mouth every 6 (six) hours as needed for moderate pain or mild pain.   Yes [provider]  ALPRAZolam (XANAX) 0.25 MG tablet TAKE 1 TABLET(0.25 MG) BY MOUTH AT BEDTIME AS NEEDED FOR ANXIETY Patient taking differently: Take 0.25 mg by mouth at bedtime. 12/23/22  Yes Philip Aspen, Limmie Patricia, MD  amiodarone (PACERONE) 200 MG tablet Take 200 mg by mouth daily.   Yes [provider]  amphetamine-dextroamphetamine (ADDERALL XR) 30 MG 24 hr capsule Take 1 capsule (30 mg total) by mouth every morning. 10/07/22  Yes Henderson Cloud, MD  amphetamine-dextroamphetamine (ADDERALL) 20 MG tablet Take 1 tablet (20 mg total) by mouth daily. 10/07/22  Yes Philip Aspen, Limmie Patricia, MD  apixaban (ELIQUIS) 5 MG TABS tablet Take 1 tablet (5 mg total) by mouth 2 (two) times daily. 09/09/22  Yes Pricilla Riffle, MD  atorvastatin (LIPITOR) 40 MG tablet Take 1 tablet (40 mg total) by mouth daily. 12/17/22  Yes Pricilla Riffle, MD  dapagliflozin propanediol (FARXIGA) 10 MG TABS tablet Take 1 tablet (10 mg total) by mouth daily before breakfast. 12/31/22  Yes Sabharwal, Aditya, DO  FLUoxetine (PROZAC) 40 MG capsule TAKE 1 CAPSULE(40 MG) BY MOUTH EVERY MORNING Patient taking differently: Take 40 mg by mouth in the morning. 12/26/22  Yes Philip Aspen, Limmie Patricia, MD  furosemide (LASIX) 40 MG tablet Take 20 mg by mouth as  needed. Take one half (0.5) tablet by mouth ( 20 mg) as needed for wt gain of 3 lbs in 24 hours or 5 lbs in one week.   Yes [provider]  lamoTRIgine (LAMICTAL) 100 MG tablet TAKE 1 TABLET(100 MG) BY MOUTH TWICE DAILY Patient taking differently: Take 100 mg by mouth 2 (two) times daily. 11/13/22  Yes Philip Aspen, Limmie Patricia, MD  losartan (COZAAR) 25 MG tablet Take 0.5 tablets (12.5 mg total) by mouth daily. 11/15/22 02/13/23 Yes Sabharwal, Aditya, DO  metoprolol succinate (TOPROL-XL) 50 MG 24 hr tablet Take 1 tablet (50 mg total) by mouth at bedtime. Take with or immediately following a meal. 11/15/22 02/13/23 Yes Sabharwal, Aditya, DO  Multiple Vitamins-Minerals (PRESERVISION AREDS 2) CAPS Take 1 capsule by mouth 2 (two) times daily.   Yes [provider]  potassium chloride (KLOR-CON) 10 MEQ tablet Take 10 mEq by mouth daily. 11/09/22  Yes [provider]  spironolactone (ALDACTONE) 25 MG tablet Take 0.5 tablets (12.5 mg total) by mouth daily. 10/30/22  Yes Simmons, Brittainy M, PA-C  zolpidem (AMBIEN) 10 MG tablet TAKE 1 TABLET(10 MG) BY MOUTH AT BEDTIME Patient taking differently: Take 10 mg by mouth at bedtime. TAKE 1 TABLET(10 MG) BY MOUTH AT BEDTIME 12/31/22  Yes Philip Aspen, Limmie Patricia, MD  Cholecalciferol (VITAMIN D3) 50 MCG (2000 UT) TABS Take 2,000 Units by mouth daily in the afternoon.    [provider]     Family History  Problem Relation Age of Onset   Heart disease Mother        post CABG history of CHF   COPD Father    Diabetes Sister    Hypertension Sister        post renal transplant   Heart disease Brother        CAD    Social History   Socioeconomic History   Marital status: Widowed    Spouse name: Not on file   Number of children: 2   Years of education: Not on file   Highest education level: Associate degree: academic program  Occupational History   Occupation: Retired  Tobacco Use   Smoking status: Former    Types:  Cigarettes    Quit date: 07/22/1992    Years since quitting: 30.4   Smokeless tobacco: Never  Vaping Use   Vaping Use: Never used  Substance and Sexual Activity   Alcohol use: Yes    Comment: wine weekly   Drug use: No   Sexual activity: Not on file  Other Topics Concern   Not on file  Social History Narrative   Not on file   Social Determinants of Health   Financial Resource Strain: Low Risk  (09/20/2022)   Overall Financial Resource Strain (CARDIA)    Difficulty of Paying Living Expenses: Not very hard  Food Insecurity: No Food Insecurity (01/03/2023)   Hunger Vital Sign    Worried About Running Out of Food in the Last Year: Never true    Ran Out of Food in the Last Year:  Never true  Transportation Needs: No Transportation Needs (01/03/2023)   PRAPARE - Administrator, Civil Service (Medical): No    Lack of Transportation (Non-Medical): No  Physical Activity: Unknown (10/04/2021)   Exercise Vital Sign    Days of Exercise per Week: 0 days    Minutes of Exercise per Session: Not on file  Stress: Not on file  Social Connections: Unknown (10/04/2021)   Social Connection and Isolation Panel [NHANES]    Frequency of Communication with Friends and Family: Not on file    Frequency of Social Gatherings with Friends and Family: Once a week    Attends Religious Services: More than 4 times per year    Active Member of [employer] or Organizations: Not on file    Attends Banker Meetings: Not on file    Marital Status: Widowed      Review of Systems see above; denies fever, headache, chest pain, worsening dyspnea, cough, abdominal pain, nausea, vomiting, bleeding, dysuria  Vital Signs: BP 128/79 (BP Location: Left Arm)   Pulse 65   Temp 98.3 F (36.8 C) (Oral)   Resp 16   Ht 5\' 4"  (1.626 m)   Wt 198 lb 10.2 oz (90.1 kg)   SpO2 95%   BMI 34.10 kg/m   Advance Care Plan: No documents on file   Physical Exam: Patient awake, alert.  Chest with slightly  decreased breath sounds bases.  Heart with normal S1-S2, normal rate; abdomen soft, positive bowel sounds, nontender.  No significant lower extremity edema.  Point tenderness sacral region  Imaging: MR LUMBAR SPINE WO CONTRAST  Result Date: 01/02/2023 CLINICAL DATA:  Fall, back pain, sacral fractures EXAM: MRI LUMBAR SPINE WITHOUT CONTRAST TECHNIQUE: Multiplanar, multisequence MR imaging of the lumbar spine was performed. No intravenous contrast was administered. COMPARISON:  01/02/2023 CT lumbar spine, 06/04/2021 MRI lumbar spine FINDINGS: Segmentation: In keeping with the same day CT lumbar spine and in correlation with the 08/07/2022 CT chest, there are 6 non rib-bearing vertebral bodies, the last of which is labeled S1, which is lumbarized. Alignment:  5 mm anterolisthesis of L5 on S1. Vertebrae: Increased T2 signal in the left sacral ala (series 6, image 13), and in the central sacrum (series 6, image 9), consistent with the fractures that are better seen on the same-day CT lumbar spine. No acute fracture in the lumbar spine. Conus medullaris and cauda equina: Conus extends to the L1-L2 level. Conus and cauda equina appear normal. Paraspinal and other soft tissues: Small renal cysts, for which no follow-up is currently indicated. The left kidney is likely surgically absent. No lymphadenopathy. Disc levels: T12-L1: Seen only on the sagittal images. No significant disc bulge, spinal canal stenosis, or neural foraminal narrowing. L1-L2: Minimal disc bulge. No spinal canal stenosis or neural foraminal narrowing. L2-L3: Minimal disc bulge. No spinal canal stenosis or neural foraminal narrowing. L3-L4: Mild disc bulge. Mild facet arthropathy. Ligamentum flavum hypertrophy. Narrowing of the lateral recesses. Mild to moderate spinal canal stenosis. Mild right neural foraminal narrowing. L4-L5: Moderate disc bulge. Moderate facet arthropathy. Ligamentum flavum hypertrophy. Narrowing of the lateral recesses.  Moderate spinal canal stenosis. No neural foraminal narrowing. L5-S1: Grade 1 anterolisthesis with disc unroofing. Moderate disc bulge. Severe facet arthropathy. Ligamentum flavum hypertrophy. Effacement of the lateral recesses. Severe spinal canal stenosis. No neural foraminal narrowing. S1-S2: Minimal disc bulge with right greater than left lateral disc osteophyte complex, which may contact the exiting right S1 nerve. Mild facet arthropathy. No spinal canal  stenosis or neural foraminal narrowing. IMPRESSION: 1. Sacral fractures are better seen on the same-day CT lumbar spine. No acute fracture in the lumbar spine. 2. L5-S1 severe spinal canal stenosis. Effacement of the lateral recesses at this level likely compresses the descending S1 nerve roots. 3. L4-L5 moderate spinal canal stenosis. Narrowing of the lateral recesses at this level could affect the descending L5 nerve roots. 4. L3-L4 mild to moderate spinal canal stenosis and mild right neural foraminal narrowing. Narrowing of the lateral recesses at this level could affect the descending L4 nerve roots. Electronically Signed   By: Wiliam Ke M.D.   On: 01/02/2023 23:16   CT Lumbar Spine Wo Contrast  Result Date: 01/02/2023 CLINICAL DATA:  Low back pain EXAM: CT LUMBAR SPINE WITHOUT CONTRAST TECHNIQUE: Multidetector CT imaging of the lumbar spine was performed without intravenous contrast administration. Multiplanar CT image reconstructions were also generated. RADIATION DOSE REDUCTION: This exam was performed according to the departmental dose-optimization program which includes automated exposure control, adjustment of the mA and/or kV according to patient size and/or use of iterative reconstruction technique. COMPARISON:  MR L Spine 06/04/21 FINDINGS: Segmentation: 6 non-rib-bearing vertebral bodies with lumbarization of S1. Alignment: Grade 1 anterolisthesis of L5 on S1. Vertebrae: Mildly displaced fracture through the left S1 transverse process  (series 5, image 44). There are nondisplaced fractures through the sacral ala bilaterally extending into the bilateral SI joints (series 3, image 109, 110). Paraspinal and other soft tissues: Aortic atherosclerotic calcifications. 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 Left kidney is not visualized. Disc levels: Moderate to severe spinal canal stenosis at L3-L4, L4-L5, and L5-S1. 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 1. Acute mildly displaced fracture through the left S1 transverse process. 2. Acute nondisplaced fractures through the sacral ala bilaterally extending into the bilateral SI joints. 3. Moderate to severe spinal canal stenosis at L3-L4, L4-L5, and L5-S1. Stable to slightly progressed compared to 2022. Aortic Atherosclerosis (ICD10-I70.0). Electronically Signed   By: Lorenza Cambridge M.D.   On: 01/02/2023 17:03   DG Hip Unilat W or Wo Pelvis 2-3 Views Left  Result Date: 01/02/2023 CLINICAL DATA:  Pain after fall EXAM: DG HIP (WITH OR WITHOUT PELVIS) 3V LEFT COMPARISON:  None Available. FINDINGS: Osteopenia. Preserved joint spaces. Hyperostosis. No obvious fracture. Mild degenerative changes of the sacroiliac joints. Soft tissue calcifications along the upper medial thigh regions bilaterally, nonspecific. With this level of osteopenia evaluation for subtle nondisplaced injury is difficult to completely exclude and if needed cross-sectional imaging as clinically appropriate. IMPRESSION: Osteopenia with degenerative changes. Electronically Signed   By: Karen Kays M.D.   On: 01/02/2023 16:33   ECHOCARDIOGRAM COMPLETE  Result Date: 12/19/2022    ECHOCARDIOGRAM REPORT   Patient Name:   SHOSHANA BRICE [facility] Date of Exam: 12/19/2022 Medical Rec #:  [id number]         Height:       64.0 in Accession #:    [id number]        Weight:       189.0 lb Date of Birth:  01/08/1951        BSA:          1.910 m Patient Age:    71 years           BP:           130/70 mmHg Patient Gender: F  HR:           51 bpm. Exam Location:  [address] Procedure: 2D Echo, 3D Echo, Cardiac Doppler, Color Doppler and Strain Analysis Indications:    I42.9 Cardiomyopathy (unspecified)  History:        Patient has prior history of Echocardiogram examinations, most                 recent 08/29/2022. CHF, Arrythmias:Atrial Fibrillation; Risk                 Factors:Hypertension. Obesity.  Sonographer:    Cathie Beams RCS Referring Phys: [id number] PAULA V ROSS IMPRESSIONS  1. Left ventricular ejection fraction, by estimation, is 45%. The left ventricle has mildly decreased function. The left ventricle has no regional wall motion abnormalities. Left ventricular diastolic parameters are consistent with Grade I diastolic dysfunction (impaired relaxation). GLS -15.7%.  2. Right ventricular systolic function is normal. The right ventricular size is normal.  3. Left atrial size was mildly dilated.  4. The mitral valve is normal in structure. Mild mitral valve regurgitation. No evidence of mitral stenosis.  5. The aortic valve is normal in structure. Aortic valve regurgitation is mild. No aortic stenosis is present.  6. The inferior vena cava is normal in size with greater than 50% respiratory variability, suggesting right atrial pressure of 3 mmHg. FINDINGS  Left Ventricle: Left ventricular ejection fraction, by estimation, is 40 to 45%. The left ventricle has mildly decreased function. The left ventricle has no regional wall motion abnormalities. The left ventricular internal cavity size was normal in size. There is no left ventricular hypertrophy. Left ventricular diastolic parameters are consistent with Grade I diastolic dysfunction (impaired relaxation). Right Ventricle: The right ventricular size is normal. No increase in right ventricular wall thickness. Right ventricular systolic function is normal. Left Atrium: Left atrial size was mildly dilated. Right  Atrium: Right atrial size was normal in size. Pericardium: There is no evidence of pericardial effusion. Mitral Valve: The mitral valve is normal in structure. Mild mitral valve regurgitation. No evidence of mitral valve stenosis. Tricuspid Valve: The tricuspid valve is normal in structure. Tricuspid valve regurgitation is trivial. No evidence of tricuspid stenosis. Aortic Valve: The aortic valve is normal in structure. Aortic valve regurgitation is mild. Aortic regurgitation PHT measures 1246 msec. No aortic stenosis is present. Pulmonic Valve: The pulmonic valve was normal in structure. Pulmonic valve regurgitation is trivial. No evidence of pulmonic stenosis. Aorta: The aortic root is normal in size and structure. Venous: The inferior vena cava is normal in size with greater than 50% respiratory variability, suggesting right atrial pressure of 3 mmHg. IAS/Shunts: No atrial level shunt detected by color flow Doppler.  LEFT VENTRICLE PLAX 2D LVIDd:         5.25 cm   Diastology LVIDs:         3.80 cm   LV e' medial:    5.18 cm/s LV PW:         1.20 cm   LV E/e' medial:  14.6 LV IVS:        1.00 cm   LV e' lateral:   7.95 cm/s LVOT diam:     2.10 cm   LV E/e' lateral: 9.5 LV SV:         68 LV SV Index:   35 LVOT Area:     3.46 cm  3D Volume EF:                          3D EF:        50 %                          LV EDV:       180 ml                          LV ESV:       90 ml                          LV SV:        90 ml RIGHT VENTRICLE RV Basal diam:  2.70 cm RV S prime:     7.73 cm/s TAPSE (M-mode): 1.8 cm RVSP:           20.0 mmHg LEFT ATRIUM              Index        RIGHT ATRIUM           Index LA diam:        3.80 cm  1.99 cm/m   RA Pressure: 3.00 mmHg LA Vol (A2C):   100.0 ml 52.36 ml/m  RA Area:     10.40 cm LA Vol (A4C):   48.1 ml  25.18 ml/m  RA Volume:   24.80 ml  12.98 ml/m LA Biplane Vol: 72.9 ml  38.17 ml/m  AORTIC VALVE LVOT Vmax:   81.30 cm/s LVOT Vmean:  51.900 cm/s  LVOT VTI:    0.195 m AI PHT:      1246 msec  AORTA Ao Root diam: 3.40 cm Ao Asc diam:  3.40 cm MITRAL VALVE               TRICUSPID VALVE MV Area (PHT): 1.34 cm    TR Peak grad:   17.0 mmHg MV Decel Time: 566 msec    TR Vmax:        206.00 cm/s MR Peak grad: 71.2 mmHg    Estimated RAP:  3.00 mmHg MR Mean grad: 30.0 mmHg    RVSP:           20.0 mmHg MR Vmax:      422.00 cm/s MR Vmean:     229.0 cm/s   SHUNTS MV E velocity: 75.50 cm/s  Systemic VTI:  0.20 m MV A velocity: 97.30 cm/s  Systemic Diam: 2.10 cm MV E/A ratio:  0.78 Aditya Sabharwal Electronically signed by Dorthula Nettles Signature Date/Time: 12/19/2022/3:00:37 PM    Final     Labs:  CBC: Recent Labs    09/20/22 0145 09/23/22 1631 01/02/23 2148 01/05/23 0332  WBC 7.2 5.5 5.5 5.4  HGB 10.2* 10.6* 10.3* 10.7*  HCT 29.9* 32.1* 32.2* 33.5*  PLT 274 310 148* 172    COAGS: No results for input(s): "INR", "APTT" in the last 8760 hours.  BMP: Recent Labs    12/31/22 1227 01/02/23 2148 01/05/23 0332 01/06/23 0323  NA 133* 130* 131* 129*  K 5.3* 3.8 4.6 4.2  CL 100 102 97* 93*  CO2 21* 19* 23 25  GLUCOSE 86 93 101* 107*  BUN 52* 37* 31* 37*  CALCIUM 9.0 8.6* 8.9 9.1  CREATININE 2.21* 1.50* 1.39* 1.49*  GFRNONAA 23* 37* 40* 37*  LIVER FUNCTION TESTS: Recent Labs    08/06/22 2020 12/04/22 1036  BILITOT 0.4 0.4  AST 22 30  ALT 28 23  ALKPHOS 108 129*  PROT 7.1 6.4  ALBUMIN 4.2 4.4    TUMOR MARKERS: No results for input(s): "AFPTM", "CEA", "CA199", "CHROMGRNA" in the last 8760 hours.  Assessment and Plan: 72 y.o. female, ex smoker, with past medical history significant for ADD, anemia, osteoarthritis, hypertension, heart failure, atrial fibrillation on Eliquis, hypertension, hyperlipidemia , renal insuff, vitamin B12/D deficiency, obesity with prior gastric bypass 2003, prior nephrectomy who was admitted to [facility] on 6/13 following ground-level fall with subsequent severe lower back pain. Hip x-ray  showed osteopenia and degenerative changes. CT of the lumbar spine without contrast showed acute mildly displaced fracture through the left S1 transverse process; acute nondisplaced fractures through the sacral alla bilaterally with moderate to severe spinal canal stenosis at L3-L4, L4-L5, L5 and S1 .Patient continues to have severe lower back/sacral pain despite IV/p.o. narcotics with radiation of pain down both lower extremities, greater on the left.  Pain exacerbated by movement.  Request now received for consideration of bilateral sacroplasty.  Imaging studies have been reviewed by Dr. Quay Burow and patient appears to be suitable candidate for bilateral sacroplasty.  Details/risks of procedure, including but not limited to, internal bleeding, infection, injury to adjacent structures, inability to eradicate back pain discussed with patient with her understanding and consent.  Procedure will be done at [facility] on 6/18 with return of patient to [facility] after case.  Patient afebrile, creatinine 1.49, WBC 5.4, hemoglobin 10.7, platelets normal, PT/INR pending.    Thank you for this interesting consult.  I greatly enjoyed meeting Leslie [name] and look forward to participating in their care.  A copy of this report was sent to the requesting provider on this date.  Electronically Signed: D. Jeananne Rama, PA-C 01/06/2023, 5:02 PM   I spent a total of  25 minutes   in face to face in clinical consultation, greater than 50% of which was counseling/coordinating care for image guided bilateral sacroplasty

## 2023-01-06 NOTE — [place] FL2 (Signed)
[place] MEDICAID FL2 LEVEL OF CARE FORM     IDENTIFICATION  Patient Name: Leslie [name] Birthdate: 11-30-1950 Sex: female Admission Date (Current Location): 01/02/2023  [facility] and [place] Number:  Producer, television/film/video and Address:  [facility],  [address] [place], [place] [address]      Provider Number: [id number]  Attending Physician Name and Address:  Glade Lloyd, MD  Relative Name and Phone Number:  Wille Glaser (daughter) Ph: [phone]    Current Level of Care: Hospital Recommended Level of Care: Skilled Nursing Facility Prior Approval Number:    Date Approved/Denied:   PASRR Number: Pending  Discharge Plan: SNF    Current Diagnoses: Patient Active Problem List   Diagnosis Date Noted   Sacral fracture (HCC) 01/02/2023   Chronic systolic CHF (congestive heart failure) (HCC) 01/02/2023   AKI (acute kidney injury) (HCC) 01/02/2023   S/P gastric bypass 01/02/2023   Renal insufficiency 09/19/2022   Acute CHF (congestive heart failure) (HCC) 08/07/2022   Status post right knee replacement 05/04/2021   Vitamin B12 deficiency 03/14/2021   Macrocytosis without anemia 03/14/2021   Vitamin D deficiency 02/17/2020   Sleep disorder 07/10/2018   ADHD (attention deficit hyperactivity disorder) 10/01/2017   Unilateral primary osteoarthritis, right knee 08/20/2017   Chronic pain of both shoulders 08/20/2017   Complete tear of right rotator cuff 04/29/2017   Osteoarthritis of left knee 06/16/2015   Status post total left knee replacement 06/16/2015   Attention deficit disorder 09/14/2007   COLONIC POLYPS, HX OF 08/03/2007   Essential hypertension 01/20/2007   OBESITY NOS 12/04/2006   Allergic rhinitis 12/04/2006   GASTROJEJUNOSTOMY, HX OF 12/04/2006   BREAST BIOPSY, HX OF 12/04/2006    Orientation RESPIRATION BLADDER Height & Weight     Self, Time, Situation, Place  Normal Incontinent Weight: 198 lb 10.2 oz (90.1 kg) Height:  5\' 4"   (162.6 cm)  BEHAVIORAL SYMPTOMS/MOOD NEUROLOGICAL BOWEL NUTRITION STATUS      Continent Diet (Heart healthy)  AMBULATORY STATUS COMMUNICATION OF NEEDS Skin   Limited Assist Verbally Skin abrasions, Other (Comment) (Abrasion: bilateral legs; Ecchymosis: bilateral arms)                       Personal Care Assistance Level of Assistance  Bathing, Feeding, Dressing Bathing Assistance: Limited assistance Feeding assistance: Independent Dressing Assistance: Limited assistance     Functional Limitations Info  Sight, Hearing, Speech Sight Info: Adequate Hearing Info: Adequate Speech Info: Adequate    SPECIAL CARE FACTORS FREQUENCY  PT (By licensed PT), OT (By licensed OT)     PT Frequency: 5x's/week OT Frequency: 5x's/week            Contractures Contractures Info: Not present    Additional Factors Info  Code Status, Allergies, Psychotropic Code Status Info: Full Allergies Info: Amoxicillin, Sulfamethoxazole Psychotropic Info: Xanax, Prozac, Lamictal         Current Medications (01/06/2023):  This is the current hospital active medication list Current Facility-Administered Medications  Medication Dose Route Frequency Provider Last Rate Last Admin   acetaminophen (TYLENOL) tablet 650 mg  650 mg Oral Q6H Verdene Lennert, MD   650 mg at 01/03/23 1706   ALPRAZolam (XANAX) tablet 0.25 mg  0.25 mg Oral QHS Verdene Lennert, MD   0.25 mg at 01/05/23 2137   amiodarone (PACERONE) tablet 200 mg  200 mg Oral Daily Verdene Lennert, MD   200 mg at 01/05/23 0924   atorvastatin (LIPITOR) tablet 40 mg  40 mg Oral Daily Verdene Lennert, MD   40 mg at 01/05/23 4098   bisacodyl (DULCOLAX) suppository 10 mg  10 mg Rectal Daily PRN Glade Lloyd, MD       cyanocobalamin (VITAMIN B12) injection 1,000 mcg  1,000 mcg Intramuscular Q0600 Glade Lloyd, MD   1,000 mcg at 01/05/23 1743   dapagliflozin propanediol (FARXIGA) tablet 10 mg  10 mg Oral QAC breakfast Verdene Lennert, MD   10 mg at  01/06/23 0847   feeding supplement (KATE FARMS STANDARD 1.4) liquid 325 mL  325 mL Oral Daily Alekh, Kshitiz, MD   325 mL at 01/05/23 1146   FLUoxetine (PROZAC) capsule 40 mg  40 mg Oral q AM Verdene Lennert, MD   40 mg at 01/06/23 0509   folic acid (FOLVITE) tablet 1 mg  1 mg Oral Daily Verdene Lennert, MD   1 mg at 01/05/23 0924   furosemide (LASIX) tablet 40 mg  40 mg Oral Daily Verdene Lennert, MD   40 mg at 01/05/23 0923   heparin injection 5,000 Units  5,000 Units Subcutaneous Q8H Glade Lloyd, MD   5,000 Units at 01/06/23 0549   HYDROcodone-acetaminophen (NORCO/VICODIN) 5-325 MG per tablet 1-2 tablet  1-2 tablet Oral Q4H PRN Glade Lloyd, MD   2 tablet at 01/06/23 0509   HYDROmorphone (DILAUDID) injection 1 mg  1 mg Intravenous Q2H PRN Glade Lloyd, MD   1 mg at 01/06/23 0708   lamoTRIgine (LAMICTAL) tablet 100 mg  100 mg Oral BID Verdene Lennert, MD   100 mg at 01/05/23 2134   lidocaine (LIDODERM) 5 % 2 patch  2 patch Transdermal Q24H Alekh, Kshitiz, MD       methocarbamol (ROBAXIN) tablet 500 mg  500 mg Oral Q6H PRN Hanley Ben, Kshitiz, MD       metoprolol succinate (TOPROL-XL) 24 hr tablet 50 mg  50 mg Oral QHS Alekh, Kshitiz, MD   50 mg at 01/05/23 2134   multivitamin with minerals tablet 1 tablet  1 tablet Oral Daily Verdene Lennert, MD   1 tablet at 01/05/23 1191   polyethylene glycol (MIRALAX / GLYCOLAX) packet 17 g  17 g Oral Daily PRN Glade Lloyd, MD       senna-docusate (Senokot-S) tablet 1 tablet  1 tablet Oral BID Alekh, Kshitiz, MD       thiamine (VITAMIN B1) tablet 100 mg  100 mg Oral Daily Verdene Lennert, MD   100 mg at 01/05/23 4782   Vitamin D (Ergocalciferol) (DRISDOL) 1.25 MG (50000 UNIT) capsule 50,000 Units  50,000 Units Oral Q7 days Glade Lloyd, MD   50,000 Units at 01/03/23 1548     Discharge Medications: Please see discharge summary for a list of discharge medications.  Relevant Imaging Results:  Relevant Lab Results:   Additional Information SSN:  [id number]  Ewing Schlein, LCSW

## 2023-01-06 NOTE — Progress Notes (Signed)
PROGRESS NOTE    Leslie [name]  ZOX:[id number] DOB: 03-23-1951 DOA: 01/02/2023 PCP: Philip Aspen, Limmie Patricia, MD   Brief Narrative:   72 y.o. female with medical history significant of HFrEF with EF of 45%, atrial fibrillation on Eliquis, hypertension, obesity s/p gastric bypass, s/p nephrectomy presented with a ground-level fall and severe lower back pain.  On presentation, hip x-ray showed osteopenia and degenerative changes.  CT of the lumbar spine without contrast showed acute mildly displaced fracture through the left S1 transverse process; acute nondisplaced fractures through the sacral alla bilaterally with moderate to severe spinal canal stenosis at L3-L4, L4-L5, L5 and S1.  EDP spoke to on-call neurosurgery who recommended MRI of lumbar spine.  PT recommended SNF placement.  Assessment & Plan:   Sacral fracture Intractable back pain after mechanical fall -Imaging as above. -Continue pain management.  Fall precautions.  Bowel regimen -PT recommending SNF placement.  TOC consulted.  Moderate to severe lumbar spinal stenosis -CT findings as above.  MRI of lumbar spine showed severe L5-S1 spinal stenosis with moderate L4-L5 spinal stenosis and mild to moderate L3-L4 spinal stenosis. -Neurosurgery recommending outpatient follow-up and recommended IR evaluation for possible sacroplasty -IR consulted: Eliquis held on 01/03/2023.  Awaiting decision regarding if IR can do sacroplasty.  Acute kidney injury on chronic kidney disease stage IIIb -Creatinine 2.1 on presentation.  Improved to 1.49 today.  Treated with IV fluids and subsequently discontinued.  Monitor.  Home losartan and spironolactone on hold.  Repeat a.m. labs.  Chronic systolic CHF Essential hypertension Hyperlipidemia -Volume A.  Strict input and output.  Daily weights.  Follows up with heart failure team as an outpatient.  Continue Lasix, metoprolol and Farxiga.  Will keep losartan and spironolactone on hold for today  as well.  Possibly resume on discharge.  Outpatient follow-up with heart failure team -Continue statin  Hyponatremia -Mild.  Repeat a.m. labs.  Hypokalemia -Resolved  Acute metabolic acidosis -Improved.  Vitamin B12 deficiency -B12 176.  Continue supplementation  Vitamin D deficiency -Continue supplementation  Moderate malnutrition -Follow nutrition recommendations  Macrocytic anemia -Possibly from vitamin B12 deficiency.  Hemoglobin stable.  Thrombocytopenia -Resolved  Paroxysmal A-fib -Currently rate controlled.  Continue amiodarone and metoprolol.  Eliquis plan as above  Status post gastric bypass -Outpatient follow-up  Obesity -Outpatient follow-up   DVT prophylaxis: Apixaban Code Status: Full Family Communication: None at bedside Disposition Plan: Status is: inpatient because: Of need for SNF placement.  Continues to be in significant pain.   Consultants: Neurosurgery/IR  Procedures: None  Antimicrobials: None   Subjective: Patient seen and examined at bedside.  Continues to have severe lower back pain requiring IV Dilaudid.  No shortness of breath, chest pain or fever reported. Objective: Vitals:   01/05/23 2120 01/05/23 2134 01/06/23 0500 01/06/23 0510  BP: 131/72   137/74  Pulse: 66 74  65  Resp: 17   17  Temp: 98.3 F (36.8 C)   97.8 F (36.6 C)  TempSrc: Oral   Oral  SpO2: 98%   99%  Weight:   90.1 kg   Height:        Intake/Output Summary (Last 24 hours) at 01/06/2023 0726 Last data filed at 01/06/2023 0510 Gross per 24 hour  Intake 360 ml  Output 1700 ml  Net -1340 ml    Filed Weights   01/04/23 0500 01/05/23 0500 01/06/23 0500  Weight: 90.3 kg 90.2 kg 90.1 kg    Examination:  General: On room air.  Not in  distress currently  ENT/neck: No no obvious thyromegaly or JVD elevation noted  respiratory: Decreased breath sounds at bases bilaterally with some crackles CVS: S1-S2; rate mostly controlled currently  abdominal:  Soft, nontender, has some slight distention; no organomegaly, bowel is normally heard  extremities: Trace lower extremity edema present; no cyanosis  CNS: Awake and alert.  No obvious focal neurologic deficits  lymph: No cervical lymphadenopathy palpable  skin: No obvious ecchymosis/lesions  psych: Judgment mood and affect are normal  musculoskeletal: No obvious joint swelling/deformity   Data Reviewed: I have personally reviewed following labs and imaging studies  CBC: Recent Labs  Lab 01/02/23 2148 01/05/23 0332  WBC 5.5 5.4  NEUTROABS 4.7 4.0  HGB 10.3* 10.7*  HCT 32.2* 33.5*  MCV 103.9* 105.0*  PLT 148* 172    Basic Metabolic Panel: Recent Labs  Lab 12/31/22 1227 01/02/23 2148 01/05/23 0332 01/06/23 0323  NA 133* 130* 131* 129*  K 5.3* 3.8 4.6 4.2  CL 100 102 97* 93*  CO2 21* 19* 23 25  GLUCOSE 86 93 101* 107*  BUN 52* 37* 31* 37*  CREATININE 2.21* 1.50* 1.39* 1.49*  CALCIUM 9.0 8.6* 8.9 9.1  MG  --   --  2.0 2.0    GFR: Estimated Creatinine Clearance: 37.1 mL/min (A) (by C-G formula based on SCr of 1.49 mg/dL (H)). Liver Function Tests: No results for input(s): "AST", "ALT", "ALKPHOS", "BILITOT", "PROT", "ALBUMIN" in the last 168 hours. No results for input(s): "LIPASE", "AMYLASE" in the last 168 hours. No results for input(s): "AMMONIA" in the last 168 hours. Coagulation Profile: No results for input(s): "INR", "PROTIME" in the last 168 hours. Cardiac Enzymes: No results for input(s): "CKTOTAL", "CKMB", "CKMBINDEX", "TROPONINI" in the last 168 hours. BNP (last 3 results) Recent Labs    08/22/22 1125 08/30/22 1335 09/12/22 1437  PROBNP 6,931* 3,625* 1,775*    HbA1C: No results for input(s): "HGBA1C" in the last 72 hours. CBG: No results for input(s): "GLUCAP" in the last 168 hours. Lipid Profile: No results for input(s): "CHOL", "HDL", "LDLCALC", "TRIG", "CHOLHDL", "LDLDIRECT" in the last 72 hours. Thyroid Function Tests: No results for  input(s): "TSH", "T4TOTAL", "FREET4", "T3FREE", "THYROIDAB" in the last 72 hours. Anemia Panel: No results for input(s): "VITAMINB12", "FOLATE", "FERRITIN", "TIBC", "IRON", "RETICCTPCT" in the last 72 hours.  Sepsis Labs: No results for input(s): "PROCALCITON", "LATICACIDVEN" in the last 168 hours.  No results found for this or any previous visit (from the past 240 hour(s)).       Radiology Studies: No results found.      Scheduled Meds:  acetaminophen  650 mg Oral Q6H   ALPRAZolam  0.25 mg Oral QHS   amiodarone  200 mg Oral Daily   atorvastatin  40 mg Oral Daily   cyanocobalamin  1,000 mcg Intramuscular Q0600   dapagliflozin propanediol  10 mg Oral QAC breakfast   feeding supplement (KATE FARMS STANDARD 1.4)  325 mL Oral Daily   FLUoxetine  40 mg Oral q AM   folic acid  1 mg Oral Daily   furosemide  40 mg Oral Daily   heparin injection (subcutaneous)  5,000 Units Subcutaneous Q8H   lamoTRIgine  100 mg Oral BID   lidocaine  2 patch Transdermal Q24H   metoprolol succinate  50 mg Oral QHS   multivitamin with minerals  1 tablet Oral Daily   thiamine  100 mg Oral Daily   Vitamin D (Ergocalciferol)  50,000 Units Oral Q7 days   Continuous Infusions:  Glade Lloyd, MD [facility] 01/06/2023, 7:26 AM

## 2023-01-06 NOTE — TOC PASRR Note (Addendum)
30 Day PASRR Note  Patient Details  Name: Leslie [name] Date of Birth: February 27, 1951  Transition of Care [facility]) CM/SW Contact:    Ewing Schlein, LCSW Phone Number: 01/06/2023, 10:23 AM  To Whom It May Concern:  Please be advised that this patient will require a short-term nursing home stay - anticipated 30 days or less for rehabilitation and strengthening. The plan is for return home.

## 2023-01-06 NOTE — Progress Notes (Signed)
Occupational Therapy Treatment Patient Details Name: Leslie [name] MRN: [id number] DOB: 1950-09-20 Today's Date: 01/06/2023   History of present illness 72 y.o. female with medical history significant of HFrEF with EF of 45%, atrial fibrillation on Eliquis, hypertension, obesity s/p gastric bypass, s/p nephrectomy, bil TKAs presented with a ground-level fall and severe lower back pain.  On presentation, hip x-ray showed osteopenia and degenerative changes.  CT of the lumbar spine without contrast showed acute mildly displaced fracture through the left S1 transverse process; acute nondisplaced fractures through the sacral alla bilaterally with moderate to severe spinal canal stenosis at L3-L4, L4-L5, L5 and S1.  Per neurosurgery note: "Pt may f/u on an outpt basis in regard to lumbar stenosis if becomes symptomatic. In regard to fractures, pt to participate in activity as tolerated. Consider IR consult for sacroplasty."  Current POC includes: IR "will consider sacroplasty if patient is still inpatient next week if sacroplasty gets approved by insurance.  Eliquis held on 01/03/2023"   OT comments  Patient was noted to have increased pain and poor safety awareness impacting progress at this time. Patient attempted to sit EOB for grooming with inability to tolerate EOB with downgraded to chair position in bed with patient having difficulty tolerating for about 5 mins to address combing hair. Patient deferred to brush teeth with transition back to supine at this time. Patient was educated on back precautions during session to decrease pain in back. Patient's discharge plan remains appropriate at this time. OT will continue to follow acutely.     Recommendations for follow up therapy are one component of a multi-disciplinary discharge planning process, led by the attending physician.  Recommendations may be updated based on patient status, additional functional criteria and insurance authorization.     Assistance Recommended at Discharge Frequent or constant Supervision/Assistance  Patient can return home with the following  A lot of help with walking and/or transfers;A lot of help with bathing/dressing/bathroom   Equipment Recommendations  BSC/3in1       Precautions / Restrictions Precautions Precautions: Fall;Back Precaution Comments: back for comfort Restrictions Weight Bearing Restrictions: No       Mobility Bed Mobility Overal bed mobility: Needs Assistance Bed Mobility: Supine to Sit, Sit to Supine     Supine to sit: Max assist Sit to supine: Max assist                   ADL either performed or assessed with clinical judgement   ADL Overall ADL's : Needs assistance/impaired     Grooming: Brushing hair Grooming Details (indicate cue type and reason): sitting in chair position with increased time. patient with increased discomfort with sitting in chair position in bed requesting to brush teeth laying down. patient was educated on how patient would not be safe to brush teeth while supine. patient verbalized understanding.         General ADL Comments: Patient attempted to transition to sitting EOB with increased pain noted even with log rolling and physical A with patient transitioning into extension to avoid pressure on L hip. patient with TD to transition safely back into supine in bed. patient with poor safety awareness continued to attempt to transition to EOB with education on slowing down and transitioning to chair position in bed for safety to complete grooming tasks with current pain levels. nurse made aware.      Cognition Arousal/Alertness: Awake/alert Behavior During Therapy: WFL for tasks assessed/performed Overall Cognitive Status: Within Functional Limits for tasks assessed  Pertinent Vitals/ Pain       Pain Assessment Pain Assessment: Faces Faces Pain Scale: Hurts worst Pain Location: L sacral area, L  side, Pain Descriptors / Indicators: Grimacing, Guarding, Jabbing Pain Intervention(s): Limited activity within patient's tolerance, Monitored during session, Premedicated before session         Frequency  Min 2X/week        Progress Toward Goals  OT Goals(current goals can now be found in the care plan section)  Progress towards OT goals: Not progressing toward goals - comment (pain is limiting functional progression)     Plan Discharge plan remains appropriate       AM-PAC OT "6 Clicks" Daily Activity     Outcome Measure   Help from another person eating meals?: A Little Help from another person taking care of personal grooming?: A Little Help from another person toileting, which includes using toliet, bedpan, or urinal?: Total Help from another person bathing (including washing, rinsing, drying)?: Total Help from another person to put on and taking off regular upper body clothing?: Total Help from another person to put on and taking off regular lower body clothing?: Total 6 Click Score: 10    End of Session    OT Visit Diagnosis: Pain;Muscle weakness (generalized) (M62.81);Repeated falls (R29.6);History of falling (Z91.81) Pain - Right/Left: Left   Activity Tolerance Patient limited by pain   Patient Left in bed;with call bell/phone within reach;with family/visitor present   Nurse Communication Mobility status        Time: 1210-1220 OT Time Calculation (min): 10 min  Charges: OT General Charges $OT Visit: 1 Visit OT Treatments $Self Care/Home Management : 8-22 mins  Rosalio Loud, MS Acute Rehabilitation Department Office# [phone]   Selinda Flavin 01/06/2023, 1:10 PM

## 2023-01-07 ENCOUNTER — Ambulatory Visit (HOSPITAL_COMMUNITY)
Admission: RE | Admit: 2023-01-07 | Discharge: 2023-01-07 | Disposition: A | Discharge status: Home / Self Care | Payer: Medicare PPO | Source: Ambulatory Visit | Attending: Internal Medicine | Admitting: Internal Medicine

## 2023-01-07 DIAGNOSIS — S3210XA Unspecified fracture of sacrum, initial encounter for closed fracture: Secondary | ICD-10-CM | POA: Insufficient documentation

## 2023-01-07 DIAGNOSIS — D649 Anemia, unspecified: Secondary | ICD-10-CM | POA: Insufficient documentation

## 2023-01-07 DIAGNOSIS — I5022 Chronic systolic (congestive) heart failure: Secondary | ICD-10-CM | POA: Diagnosis not present

## 2023-01-07 DIAGNOSIS — Z87891 Personal history of nicotine dependence: Secondary | ICD-10-CM | POA: Insufficient documentation

## 2023-01-07 DIAGNOSIS — E785 Hyperlipidemia, unspecified: Secondary | ICD-10-CM | POA: Insufficient documentation

## 2023-01-07 DIAGNOSIS — F988 Other specified behavioral and emotional disorders with onset usually occurring in childhood and adolescence: Secondary | ICD-10-CM | POA: Insufficient documentation

## 2023-01-07 DIAGNOSIS — I509 Heart failure, unspecified: Secondary | ICD-10-CM | POA: Insufficient documentation

## 2023-01-07 DIAGNOSIS — N179 Acute kidney failure, unspecified: Secondary | ICD-10-CM | POA: Diagnosis not present

## 2023-01-07 DIAGNOSIS — W19XXXA Unspecified fall, initial encounter: Secondary | ICD-10-CM | POA: Insufficient documentation

## 2023-01-07 DIAGNOSIS — I4891 Unspecified atrial fibrillation: Secondary | ICD-10-CM | POA: Insufficient documentation

## 2023-01-07 DIAGNOSIS — I110 Hypertensive heart disease with heart failure: Secondary | ICD-10-CM | POA: Insufficient documentation

## 2023-01-07 DIAGNOSIS — Z7901 Long term (current) use of anticoagulants: Secondary | ICD-10-CM | POA: Insufficient documentation

## 2023-01-07 HISTORY — PX: IR SACROPLASTY BILATERAL: IMG5561

## 2023-01-07 LAB — CBC WITH DIFFERENTIAL/PLATELET
Abs Immature Granulocytes: 0.05 10*3/uL (ref 0.00–0.07)
Basophils Absolute: 0 10*3/uL (ref 0.0–0.1)
Basophils Relative: 1 %
Eosinophils Absolute: 0.4 10*3/uL (ref 0.0–0.5)
Eosinophils Relative: 8 %
HCT: 33.5 % — ABNORMAL LOW (ref 36.0–46.0)
Hemoglobin: 10.8 g/dL — ABNORMAL LOW (ref 12.0–15.0)
Immature Granulocytes: 1 %
Lymphocytes Relative: 11 %
Lymphs Abs: 0.6 10*3/uL — ABNORMAL LOW (ref 0.7–4.0)
MCH: 32.8 pg (ref 26.0–34.0)
MCHC: 32.2 g/dL (ref 30.0–36.0)
MCV: 101.8 fL — ABNORMAL HIGH (ref 80.0–100.0)
Monocytes Absolute: 0.7 10*3/uL (ref 0.1–1.0)
Monocytes Relative: 13 %
Neutro Abs: 3.6 10*3/uL (ref 1.7–7.7)
Neutrophils Relative %: 66 %
Platelets: 247 10*3/uL (ref 150–400)
RBC: 3.29 MIL/uL — ABNORMAL LOW (ref 3.87–5.11)
RDW: 12.5 % (ref 11.5–15.5)
WBC: 5.3 10*3/uL (ref 4.0–10.5)
nRBC: 0 % (ref 0.0–0.2)

## 2023-01-07 LAB — PROTIME-INR
INR: 0.9 (ref 0.8–1.2)
Prothrombin Time: 12.6 seconds (ref 11.4–15.2)

## 2023-01-07 LAB — BASIC METABOLIC PANEL
Anion gap: 12 (ref 5–15)
BUN: 44 mg/dL — ABNORMAL HIGH (ref 8–23)
CO2: 24 mmol/L (ref 22–32)
Calcium: 9.3 mg/dL (ref 8.9–10.3)
Chloride: 95 mmol/L — ABNORMAL LOW (ref 98–111)
Creatinine, Ser: 1.47 mg/dL — ABNORMAL HIGH (ref 0.44–1.00)
GFR, Estimated: 38 mL/min — ABNORMAL LOW (ref >60)
Glucose, Bld: 104 mg/dL — ABNORMAL HIGH (ref 70–99)
Potassium: 3.6 mmol/L (ref 3.5–5.1)
Sodium: 131 mmol/L — ABNORMAL LOW (ref 135–145)

## 2023-01-07 MED ORDER — FENTANYL CITRATE (PF) 100 MCG/2ML IJ SOLN
INTRAMUSCULAR | Status: AC | PRN
  Administered 2023-01-07 (×2): 50 ug via INTRAVENOUS
  Administered 2023-01-07: 25 ug via INTRAVENOUS

## 2023-01-07 MED ORDER — BUPIVACAINE HCL (PF) 0.5 % IJ SOLN: 20.0000 mL | Freq: Once | INTRAMUSCULAR | Status: DC

## 2023-01-07 MED ORDER — BUPIVACAINE HCL (PF) 0.5 % IJ SOLN
INTRAMUSCULAR | Status: AC
  Filled 2023-01-07: qty 30

## 2023-01-07 MED ORDER — LIDOCAINE HCL (PF) 1 % IJ SOLN
INTRAMUSCULAR | Status: AC
  Filled 2023-01-07: qty 30

## 2023-01-07 MED ORDER — FENTANYL CITRATE (PF) 100 MCG/2ML IJ SOLN
INTRAMUSCULAR | Status: AC
  Filled 2023-01-07: qty 2

## 2023-01-07 MED ORDER — LIDOCAINE HCL (PF) 1 % IJ SOLN: 30.0000 mL | Freq: Once | INTRAMUSCULAR | Status: DC

## 2023-01-07 MED ORDER — MIDAZOLAM HCL 2 MG/2ML IJ SOLN
INTRAMUSCULAR | Status: AC | PRN
  Administered 2023-01-07: .5 mg via INTRAVENOUS
  Administered 2023-01-07: 1 mg via INTRAVENOUS
  Administered 2023-01-07 (×2): .5 mg via INTRAVENOUS

## 2023-01-07 MED ORDER — MIDAZOLAM HCL 2 MG/2ML IJ SOLN
INTRAMUSCULAR | Status: AC
  Filled 2023-01-07: qty 2

## 2023-01-07 MED ORDER — VANCOMYCIN HCL IN DEXTROSE 1-5 GM/200ML-% IV SOLN
INTRAVENOUS | Status: AC | PRN
  Administered 2023-01-07: 1000 mg via INTRAVENOUS

## 2023-01-07 NOTE — Progress Notes (Signed)
Jamie at [employer] said transport will arrive at patients room by 09:30 for transport to arrive at [facility] IR department by 10:00 on 01/07/23.  Patient NPO. Writer will inform day shift Charity fundraiser.

## 2023-01-07 NOTE — Progress Notes (Signed)
PROGRESS NOTE    Leslie [name]  ZOX:[id number] DOB: 1950/07/30 DOA: 01/02/2023 PCP: Philip Aspen, Limmie Patricia, MD   Brief Narrative:   72 y.o. female with medical history significant of HFrEF with EF of 45%, atrial fibrillation on Eliquis, hypertension, obesity s/p gastric bypass, s/p nephrectomy presented with a ground-level fall and severe lower back pain.  On presentation, hip x-ray showed osteopenia and degenerative changes.  CT of the lumbar spine without contrast showed acute mildly displaced fracture through the left S1 transverse process; acute nondisplaced fractures through the sacral alla bilaterally with moderate to severe spinal canal stenosis at L3-L4, L4-L5, L5 and S1.  EDP spoke to on-call neurosurgery who recommended MRI of lumbar spine.  PT recommended SNF placement.  IR consulted: Planning for sacroplasty today.  Assessment & Plan:   Sacral fracture Intractable back pain after mechanical fall -Imaging as above. -Continue pain management.  Fall precautions.  Bowel regimen -PT recommending SNF placement.  TOC consulted. --IR consulted: Eliquis held on 01/03/2023.  Planning for sacroplasty today.  Moderate to severe lumbar spinal stenosis -CT findings as above.  MRI of lumbar spine showed severe L5-S1 spinal stenosis with moderate L4-L5 spinal stenosis and mild to moderate L3-L4 spinal stenosis. -Neurosurgery recommending outpatient follow-up and recommended IR evaluation for possible sacroplasty  Acute kidney injury on chronic kidney disease stage IIIb -Creatinine 2.1 on presentation.  Improved to 1.47 today.  Treated with IV fluids and subsequently discontinued.  Monitor.  Home losartan and spironolactone on hold.  Repeat a.m. labs.  Chronic systolic CHF Essential hypertension Hyperlipidemia -Strict input and output.  Daily weights.  Fluid restriction.  Follows up with heart failure team as an outpatient.  Continue Lasix, metoprolol and Farxiga.  Will keep losartan  and spironolactone on hold for today as well.  Possibly resume on discharge.  Outpatient follow-up with heart failure team -Continue statin  Hyponatremia -Mild.  Repeat a.m. labs.  Hypokalemia -Resolved  Acute metabolic acidosis -Improved.  Vitamin B12 deficiency -B12 176.  Continue supplementation  Vitamin D deficiency -Continue supplementation  Moderate malnutrition -Follow nutrition recommendations  Macrocytic anemia -Possibly from vitamin B12 deficiency.  Hemoglobin stable.  Thrombocytopenia -Resolved  Paroxysmal A-fib -Currently rate controlled.  Continue amiodarone and metoprolol.  Eliquis plan as above  Status post gastric bypass -Outpatient follow-up  Obesity -Outpatient follow-up   DVT prophylaxis: Apixaban Code Status: Full Family Communication: None at bedside Disposition Plan: Status is: inpatient because: Of need for SNF placement.  Continues to be in significant pain.   Consultants: Neurosurgery/IR  Procedures: None  Antimicrobials: None   Subjective: Patient seen and examined at bedside.  Still complains of intermittent severe back pain requiring IV Dilaudid.  No chest pain, vomiting, fever reported.   Objective: Vitals:   01/06/23 1935 01/06/23 2102 01/07/23 0500 01/07/23 0522  BP:  (!) 147/79  131/73  Pulse: 74 66  65  Resp:  17  18  Temp:  99 F (37.2 C)  97.8 F (36.6 C)  TempSrc:  Oral  Oral  SpO2:  92%  99%  Weight:   85.5 kg   Height:        Intake/Output Summary (Last 24 hours) at 01/07/2023 0743 Last data filed at 01/07/2023 0600 Gross per 24 hour  Intake 930 ml  Output 1850 ml  Net -920 ml    Filed Weights   01/05/23 0500 01/06/23 0500 01/07/23 0500  Weight: 90.2 kg 90.1 kg 85.5 kg    Examination:  General: No acute distress.  Still on room air.   ENT/neck: No palpable neck masses or JVD elevation noted  respiratory: Bilateral decreased breath sounds at bases with scattered crackles  CVS: Rate mostly  controlled; S1 and S2 heard abdominal: Soft, nontender, slightly distended; no organomegaly, normal bowel sounds are heard  extremities: No clubbing; mild lower extremity edema present bilaterally  CNS: Alert and oriented.  No obvious focal deficits noted lymph: No lymphadenopathy palpable skin: No obvious petechiae/rashes  psych: Affect, mood and judgment are normal  musculoskeletal: No obvious joint tenderness/erythema  Data Reviewed: I have personally reviewed following labs and imaging studies  CBC: Recent Labs  Lab 01/02/23 2148 01/05/23 0332 01/07/23 0321  WBC 5.5 5.4 5.3  NEUTROABS 4.7 4.0 3.6  HGB 10.3* 10.7* 10.8*  HCT 32.2* 33.5* 33.5*  MCV 103.9* 105.0* 101.8*  PLT 148* 172 247    Basic Metabolic Panel: Recent Labs  Lab 12/31/22 1227 01/02/23 2148 01/05/23 0332 01/06/23 0323 01/07/23 0321  NA 133* 130* 131* 129* 131*  K 5.3* 3.8 4.6 4.2 3.6  CL 100 102 97* 93* 95*  CO2 21* 19* 23 25 24   GLUCOSE 86 93 101* 107* 104*  BUN 52* 37* 31* 37* 44*  CREATININE 2.21* 1.50* 1.39* 1.49* 1.47*  CALCIUM 9.0 8.6* 8.9 9.1 9.3  MG  --   --  2.0 2.0  --     GFR: Estimated Creatinine Clearance: 36.6 mL/min (A) (by C-G formula based on SCr of 1.47 mg/dL (H)). Liver Function Tests: No results for input(s): "AST", "ALT", "ALKPHOS", "BILITOT", "PROT", "ALBUMIN" in the last 168 hours. No results for input(s): "LIPASE", "AMYLASE" in the last 168 hours. No results for input(s): "AMMONIA" in the last 168 hours. Coagulation Profile: Recent Labs  Lab 01/07/23 0321  INR 0.9   Cardiac Enzymes: No results for input(s): "CKTOTAL", "CKMB", "CKMBINDEX", "TROPONINI" in the last 168 hours. BNP (last 3 results) Recent Labs    08/22/22 1125 08/30/22 1335 09/12/22 1437  PROBNP 6,931* 3,625* 1,775*    HbA1C: No results for input(s): "HGBA1C" in the last 72 hours. CBG: No results for input(s): "GLUCAP" in the last 168 hours. Lipid Profile: No results for input(s): "CHOL",  "HDL", "LDLCALC", "TRIG", "CHOLHDL", "LDLDIRECT" in the last 72 hours. Thyroid Function Tests: No results for input(s): "TSH", "T4TOTAL", "FREET4", "T3FREE", "THYROIDAB" in the last 72 hours. Anemia Panel: No results for input(s): "VITAMINB12", "FOLATE", "FERRITIN", "TIBC", "IRON", "RETICCTPCT" in the last 72 hours.  Sepsis Labs: No results for input(s): "PROCALCITON", "LATICACIDVEN" in the last 168 hours.  No results found for this or any previous visit (from the past 240 hour(s)).       Radiology Studies: No results found.      Scheduled Meds:  acetaminophen  650 mg Oral Q6H   ALPRAZolam  0.25 mg Oral QHS   amiodarone  200 mg Oral Daily   atorvastatin  40 mg Oral Daily   cyanocobalamin  1,000 mcg Intramuscular Q0600   dapagliflozin propanediol  10 mg Oral QAC breakfast   feeding supplement (K[employer] STANDARD 1.4)  325 mL Oral Daily   FLUoxetine  40 mg Oral q AM   folic acid  1 mg Oral Daily   furosemide  40 mg Oral Daily   heparin injection (subcutaneous)  5,000 Units Subcutaneous Q8H   lamoTRIgine  100 mg Oral BID   lidocaine  2 patch Transdermal Q24H   metoprolol succinate  50 mg Oral QHS   multivitamin with minerals  1 tablet Oral Daily  senna-docusate  1 tablet Oral BID   thiamine  100 mg Oral Daily   Vitamin D (Ergocalciferol)  50,000 Units Oral Q7 days   Continuous Infusions:  vancomycin            Glade Lloyd, MD [facility] 01/07/2023, 7:43 AM

## 2023-01-07 NOTE — Consult Note (Signed)
   T 01/07/2023  Leslie [name] [birth year] [id number]  [facility]) Accountable Care Organization (ACO) [facility] Liaison Note   Location: [facility] Liaison screened the patient remotely at [facility].  Insurance: [employer] Medicare PPO   [name] is a 72 y.o. female who is a Primary Care Patient of Philip Aspen, Limmie Patricia, MD. The patient was screened for  day readmission hospitalization with noted medium risk score for unplanned readmission risk. The patient was assessed for potential [facility]) Care Management service needs for post hospital transition for care coordination. Review of patient's electronic medical record reveals patient is being considered for a skill nursing transition for SNF-rehab when medically ready and will need insurance authorization. Patient is currently post procedure, will follow.  Plan: [facility] Liaison on behalf of [facility] will continue to follow progress and disposition to asess for post hospital community care coordination/management needs.  Referral request for community care coordination: anticipate [facility] Transitions of Care Team follow up. If goes to a SNF for rehab then post hospital needs will be met at that level of care.   [facility] Care Management does not replace or interfere with any arrangements made by the Inpatient Transition of Care team.   For questions contact:   Charlesetta Shanks, RN BSN CCM [facility]  [phone] business mobile phone Toll free office [phone]  *Concierge Line  [phone] Fax number: [phone] [email or web address]

## 2023-01-07 NOTE — Procedures (Signed)
INTERVENTIONAL NEURORADIOLOGY BRIEF POSTPROCEDURE NOTE  FLUOROSCOPY GUIDED BILATERAL SACROPLASTY  Attending: Dr. Baldemar Lenis  Diagnosis: Sacral fracture  Access site: Percutaneous  Anesthesia: Moderate sedation  Medication used: 2.5 mg Versed IV; 125 mcg Fentanyl IV.  Complications: None  Estimated blood loss: Negligible  Specimen: None  Bilateral longitudinal approach for sacroplasty performed.  The patient tolerated the procedure well without incident or complication and is in stable condition.

## 2023-01-08 DIAGNOSIS — S3210XA Unspecified fracture of sacrum, initial encounter for closed fracture: Secondary | ICD-10-CM | POA: Diagnosis not present

## 2023-01-08 DIAGNOSIS — E440 Moderate protein-calorie malnutrition: Secondary | ICD-10-CM | POA: Insufficient documentation

## 2023-01-08 LAB — CBC WITH DIFFERENTIAL/PLATELET
Abs Immature Granulocytes: 0.09 10*3/uL — ABNORMAL HIGH (ref 0.00–0.07)
Basophils Absolute: 0.1 10*3/uL (ref 0.0–0.1)
Basophils Relative: 1 %
Eosinophils Absolute: 0.4 10*3/uL (ref 0.0–0.5)
Eosinophils Relative: 6 %
HCT: 34.1 % — ABNORMAL LOW (ref 36.0–46.0)
Hemoglobin: 11 g/dL — ABNORMAL LOW (ref 12.0–15.0)
Immature Granulocytes: 1 %
Lymphocytes Relative: 6 %
Lymphs Abs: 0.4 10*3/uL — ABNORMAL LOW (ref 0.7–4.0)
MCH: 32.9 pg (ref 26.0–34.0)
MCHC: 32.3 g/dL (ref 30.0–36.0)
MCV: 102.1 fL — ABNORMAL HIGH (ref 80.0–100.0)
Monocytes Absolute: 0.8 10*3/uL (ref 0.1–1.0)
Monocytes Relative: 11 %
Neutro Abs: 5 10*3/uL (ref 1.7–7.7)
Neutrophils Relative %: 75 %
Platelets: 268 10*3/uL (ref 150–400)
RBC: 3.34 MIL/uL — ABNORMAL LOW (ref 3.87–5.11)
RDW: 12.5 % (ref 11.5–15.5)
WBC: 6.8 10*3/uL (ref 4.0–10.5)
nRBC: 0 % (ref 0.0–0.2)

## 2023-01-08 LAB — BASIC METABOLIC PANEL
Anion gap: 11 (ref 5–15)
BUN: 37 mg/dL — ABNORMAL HIGH (ref 8–23)
CO2: 24 mmol/L (ref 22–32)
Calcium: 9.2 mg/dL (ref 8.9–10.3)
Chloride: 98 mmol/L (ref 98–111)
Creatinine, Ser: 1.2 mg/dL — ABNORMAL HIGH (ref 0.44–1.00)
GFR, Estimated: 48 mL/min — ABNORMAL LOW (ref >60)
Glucose, Bld: 105 mg/dL — ABNORMAL HIGH (ref 70–99)
Potassium: 3.8 mmol/L (ref 3.5–5.1)
Sodium: 133 mmol/L — ABNORMAL LOW (ref 135–145)

## 2023-01-08 LAB — MAGNESIUM: Magnesium: 2.2 mg/dL (ref 1.7–2.4)

## 2023-01-08 MED ORDER — VITAMIN D 25 MCG (1000 UNIT) PO TABS: 2000.0000 [IU] | ORAL_TABLET | Freq: Every day | ORAL | Status: DC

## 2023-01-08 MED ORDER — APIXABAN 5 MG PO TABS
5.0000 mg | ORAL_TABLET | Freq: Two times a day (BID) | ORAL | Status: DC
  Administered 2023-01-09: 5 mg via ORAL
  Filled 2023-01-08: qty 1

## 2023-01-08 MED ORDER — ORAL CARE MOUTH RINSE: 15.0000 mL | OROMUCOSAL | Status: DC | PRN
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
## 2023-01-08 NOTE — Progress Notes (Signed)
PROGRESS NOTE    [name]  YQI:[id number] DOB: 06/03/1951 DOA: 01/02/2023 PCP: Philip Aspen, Limmie Patricia, MD    Brief Narrative:   Leslie [name] is a 72 y.o. female with past medical history significant for chronic systolic congestive heart failure, paroxysmal atrial fibrillation on anticoagulation, ADHD, HTN, obesity s/p gastric bypass, s/p nephrectomy who presented to [facility] ED on 6/13 with severe lower back pain following a ground-level fall.  Workup in the ED with hip x-ray showing osteopenia and degenerative changes, CT L-spine without contrast with acute mildly displaced fracture through the left S1 transverse process, acute nondisplaced fracture through the sacral ala bilaterally with moderate to severe spinal canal stenosis at L3-L4, L4-L5, L5 and S1.  EDP discussed case with neurosurgery who recommended MR L-spine.  TRH consulted for admission.  MR L-spine notable for moderate/severe stenosis L4-5, L5-S1.  Neurosurgery recommends follow-up outpatient basis if becomes symptomatic.  Interventional radiology was consulted and patient underwent sacroplasty on 01/07/2023.    Currently PT/OT recommending SNF placement, TOC consulted and awaiting placement.  Assessment & Plan:   Sacral fracture Intractable low back pain following mechanical fall Patient presenting to ED following mechanical fall with acute low back pain. hip x-ray showing osteopenia and degenerative changes, CT L-spine without contrast with acute mildly displaced fracture through the left S1 transverse process, acute nondisplaced fracture through the sacral ala bilaterally with moderate to severe spinal canal stenosis at L3-L4, L4-L5, L5 and S1.  MR L-spine notable for moderate/severe stenosis L4-5, L5-S1.  Neurosurgery was consulted and recommended outpatient follow-up in regards to the stenosis.  Interventional radiology was consulted and patient underwent sacroplasty on 01/07/2023. -- Continue PT/OT  efforts while inpatient, pending SNF placement  Moderate/severe lumbar spinal stenosis MRI of lumbar spine showed severe L5-S1 spinal stenosis with moderate L4-L5 spinal stenosis and mild to moderate L3-L4 spinal stenosis. Neurosurgery recommending outpatient follow-up and recommended IR evaluation for possible sacroplasty -- Norco 5-325 mg 1-2 tablets every 4 hours as needed moderate/severe pain -- Dilaudid 1 g IV every 2 hours as needed breakthrough pain not relieved with oral medication -- Lidocaine patch  Acute renal failure on CKD stage IIIb Acute metabolic acidosis Creatinine elevated 2.1 on admission, etiology likely secondary to prerenal azotemia in the setting of dehydration.  Treated with IV fluids which have now been discontinued. -- Cr 2.1>>1.47>1.20 -- Continue to encourage increased oral intake -- Continue to hold home losartan/spironolactone for now -- BMP in a.m.  Chronic combined systolic and diastolic congestive heart failure, compensated Hx essential hypertension Follows with heart failure team outpatient.  Most recent TTE 12/19/2022 with LVEF 45%, LV mildly decreased function, grade 1 diastolic dysfunction, LA mildly dilated, IVC normal in size, no aortic stenosis. -- Metoprolol succinate 50 mg p.o. daily -- Furosemide 40 mg p.o. daily -- Holding home losartan/spironolactone for now  Paroxysmal atrial fibrillation -- Amiodarone 200 mg p.o. daily --Restart apixaban tomorrow morning -- Plan for atrial fibrillation ablation with Dr. Nelly Laurence timbre 2024  Hyperlipidemia -- Atorvastatin 40 mg p.o. daily  Depression/anxiety: -- Prozac 40 mg p.o. daily -- Xanax 0.25 mg p.o. nightly  Obesity s/p gastric bypass Outpatient follow-up with PCP.  Vitamin D deficiency Vitamin D 25-hydroxy level 23.11.  Started on ergocalciferol 50,000 units orally every 7 days -- Recommend repeat vitamin D level in 6-12 weeks  ADHD --Resume Adderall on discharge   DVT prophylaxis:  heparin injection 5,000 Units Start: 01/07/23 2200    Code Status: Full Code Family Communication:  No family present at bedside this morning  Disposition Plan:  Level of care: Med-Surg Status is: Inpatient Remains inpatient appropriate because: Pending SNF placement, medically stable for discharge once bed available    Consultants:  Neurosurgery Interventional radiology  Procedures:  Sacroplasty by IR 6/18  Antimicrobials:  None  Subjective: Pain seen examined bedside, lying in bed.  Continues to complain of intermittent pain to her lower back.  Just underwent sacroplasty yesterday.  Reports that the pain medication "does help".  Pending SNF placement.  No other specific complaints this morning.  Denies headache, no dizziness, no chest pain, no palpitations, no shortness of breath, no abdominal pain.  No acute events overnight per nursing staff.  Objective: Vitals:   01/07/23 0522 01/07/23 1227 01/07/23 2058 01/08/23 0513  BP: 131/73 136/72 (!) 121/59 114/63  Pulse: 65 (!) 58 67 69  Resp: 18 16 17 17   Temp: 97.8 F (36.6 C) 97.7 F (36.5 C) 97.6 F (36.4 C) 97.6 F (36.4 C)  TempSrc: Oral Oral Oral Oral  SpO2: 99% 93% 92% 97%  Weight:      Height:        Intake/Output Summary (Last 24 hours) at 01/08/2023 1222 Last data filed at 01/08/2023 1119 Gross per 24 hour  Intake 780 ml  Output 1000 ml  Net -220 ml   Filed Weights   01/05/23 0500 01/06/23 0500 01/07/23 0500  Weight: 90.2 kg 90.1 kg 85.5 kg    Examination:  Physical Exam: GEN: NAD, alert and oriented x 3, elderly in appearance HEENT: NCAT, PERRL, EOMI, sclera clear, MMM PULM: CTAB w/o wheezes/crackles, normal respiratory effort, on room air CV: RRR w/o M/G/R GI: abd soft, NTND, NABS, no R/G/M MSK: no peripheral edema, moves all extremities independently, neurovascular intact NEURO: CN II-XII intact, no focal deficits, sensation to light touch intact PSYCH: Depressed mood, flat affect Integumentary:  No concerning rashes/lesions/wounds noted exposed skin surfaces    Data Reviewed: I have personally reviewed following labs and imaging studies  CBC: Recent Labs  Lab 01/02/23 2148 01/05/23 0332 01/07/23 0321 01/08/23 0337  WBC 5.5 5.4 5.3 6.8  NEUTROABS 4.7 4.0 3.6 5.0  HGB 10.3* 10.7* 10.8* 11.0*  HCT 32.2* 33.5* 33.5* 34.1*  MCV 103.9* 105.0* 101.8* 102.1*  PLT 148* 172 247 268   Basic Metabolic Panel: Recent Labs  Lab 01/02/23 2148 01/05/23 0332 01/06/23 0323 01/07/23 0321 01/08/23 0337  NA 130* 131* 129* 131* 133*  K 3.8 4.6 4.2 3.6 3.8  CL 102 97* 93* 95* 98  CO2 19* 23 25 24 24   GLUCOSE 93 101* 107* 104* 105*  BUN 37* 31* 37* 44* 37*  CREATININE 1.50* 1.39* 1.49* 1.47* 1.20*  CALCIUM 8.6* 8.9 9.1 9.3 9.2  MG  --  2.0 2.0  --  2.2   GFR: Estimated Creatinine Clearance: 44.8 mL/min (A) (by C-G formula based on SCr of 1.2 mg/dL (H)). Liver Function Tests: No results for input(s): "AST", "ALT", "ALKPHOS", "BILITOT", "PROT", "ALBUMIN" in the last 168 hours. No results for input(s): "LIPASE", "AMYLASE" in the last 168 hours. No results for input(s): "AMMONIA" in the last 168 hours. Coagulation Profile: Recent Labs  Lab 01/07/23 0321  INR 0.9   Cardiac Enzymes: No results for input(s): "CKTOTAL", "CKMB", "CKMBINDEX", "TROPONINI" in the last 168 hours. BNP (last 3 results) Recent Labs    08/22/22 1125 08/30/22 1335 09/12/22 1437  PROBNP 6,931* 3,625* 1,775*   HbA1C: No results for input(s): "HGBA1C" in the last 72 hours. CBG: No  results for input(s): "GLUCAP" in the last 168 hours. Lipid Profile: No results for input(s): "CHOL", "HDL", "LDLCALC", "TRIG", "CHOLHDL", "LDLDIRECT" in the last 72 hours. Thyroid Function Tests: No results for input(s): "TSH", "T4TOTAL", "FREET4", "T3FREE", "THYROIDAB" in the last 72 hours. Anemia Panel: No results for input(s): "VITAMINB12", "FOLATE", "FERRITIN", "TIBC", "IRON", "RETICCTPCT" in the last 72 hours. Sepsis  Labs: No results for input(s): "PROCALCITON", "LATICACIDVEN" in the last 168 hours.  No results found for this or any previous visit (from the past 240 hour(s)).       Radiology Studies: IR SACROPLASTY BILATERAL  Result Date: 01/07/2023 INDICATION: CLOVA GOYNE is a 72 y.o. female, ex smoker, with past medical history significant for ADD, anemia, osteoarthritis, hypertension, heart failure, atrial fibrillation on Eliquis, hypertension, hyperlipidemia , vitamin B12/D deficiency, obesity with prior gastric bypass 2003, prior nephrectomy who was admitted to [facility] on 6/13 following ground-level fall with subsequent severe lower back pain. Hip x-ray showed osteopenia and degenerative changes. CT of the lumbar spine without contrast showed acute mildly displaced fracture through the left S1 transverse process; acute nondisplaced fractures through the sacral alla bilaterally.Patient continues to have severe lower back/sacral pain despite IV/p.o. narcotics with radiation of pain down both lower extremities, greater on the left. Pain exacerbated by movement. She comes to our service today for bilateral sacroplasty. EXAM: FLUOROSCOPY GUIDED BILATERAL SACROPLASTY MEDICATIONS: As antibiotic prophylaxis, Vancomycin 1 g IV was ordered pre-procedure and administered intravenously within 1 hour of incision. All current medications are in the EMR and have been reviewed as part of this encounter. ANESTHESIA/SEDATION: Moderate (conscious) sedation was employed during this procedure. A total of Versed 2.5 mg and Fentanyl 125 mcg were administered intravenously for moderate conscious sedation monitored under my direct supervision. Total intraservice time of sedation was for 3 minutes. The patient's vital signs were monitored throughout the procedure and recorded in the patient's medical record by the radiology nurse. FLUOROSCOPY: Radiation Exposure Index (as provided by the fluoroscopic device): PSD 2,394  mGy Kerma COMPLICATIONS: None immediate. TECHNIQUE: Informed written consent was obtained from the patient after a thorough discussion of the procedural risks, benefits and alternatives. All questions were addressed. Maximal Sterile Barrier Technique was utilized including caps, mask, sterile gowns, sterile gloves, sterile drape, hand hygiene and skin antiseptic. A timeout was performed prior to the initiation of the procedure. PROCEDURE: The patient was placed in prone position on the angiography table. The sacral region was prepped and draped in a sterile fashion. Under fluoroscopy, the right sacral ala was delineated and the skin area was marked. The skin was infiltrated with a 1% Lidocaine at the expected entry site. Using a 22-gauge spinal needle, the soft issue and periosteum were infiltrated with Bupivacaine 0.5%. A skin incision was made at the access site. Subsequently, an 11-gauge Kyphon trocar was inserted under fluoroscopic guidance until contact with the posterior S3 cortex. The trocar was inserted under light hammer tapping on in a longitudinal fashion along the sacral ala until S2 was reached. The diamond mandrill was removed. Under fluoroscopy, the left sacral ala was delineated and the skin area was marked. The skin was infiltrated with a 1% Lidocaine at the expected entry site. Using a 22-gauge spinal needle, the soft issue and periosteum were infiltrated with Bupivacaine 0.5%. A skin incision was made at the access site. Subsequently, an 11-gauge Kyphon trocar was inserted under fluoroscopic guidance until contact with the posterior S3 cortex. The trocar was inserted under light hammer tapping on in a  longitudinal fashion along the sacral ala until S2 was reached. The diamond mandrill was removed. A bone drill was coaxially advanced to the S1 level on each side. Under continuous fluoroscopy guidance in the AP and lateral views, the bilateral sacral ala were filled with previously mixed  polymethyl-methacrylate (PMMA) added to barium for opacification. Both cannulas were later removed. Hemostasis was achieved at the skin entry site. The access sites were cleaned and covered with a sterile bandage. There were no acute complications. Patient tolerated the procedure well. The patient was [facility]. IMPRESSION: 1. Successful bilateral sacral plasty for treatment of bilateral insufficiency/traumatic fractures with intractable pain. 2. If the patient has known osteoporosis, recommend treatment as clinically indicated. If the patient's bone density status is unknown, DEXA scan is recommended. Electronically Signed   By: Baldemar Lenis M.D.   On: 01/07/2023 16:41        Scheduled Meds:  acetaminophen  650 mg Oral Q6H   ALPRAZolam  0.25 mg Oral QHS   amiodarone  200 mg Oral Daily   atorvastatin  40 mg Oral Daily   dapagliflozin propanediol  10 mg Oral QAC breakfast   feeding supplement (K[employer] STANDARD 1.4)  325 mL Oral Daily   FLUoxetine  40 mg Oral q AM   folic acid  1 mg Oral Daily   furosemide  40 mg Oral Daily   heparin injection (subcutaneous)  5,000 Units Subcutaneous Q8H   lamoTRIgine  100 mg Oral BID   lidocaine  2 patch Transdermal Q24H   metoprolol succinate  50 mg Oral QHS   multivitamin with minerals  1 tablet Oral Daily   senna-docusate  1 tablet Oral BID   thiamine  100 mg Oral Daily   Vitamin D (Ergocalciferol)  50,000 Units Oral Q7 days   Continuous Infusions:   LOS: 5 days    Time spent: 52 minutes spent on chart review, discussion with nursing staff, consultants, updating family and interview/physical exam; more than 50% of that time was spent in counseling and/or coordination of care.    Alvira Philips [place], DO [facility] Available via Epic secure chat 7am-7pm After these hours, please refer to coverage provider listed on [email or web address] 01/08/2023, 12:22 PM

## 2023-01-08 NOTE — Progress Notes (Signed)
Patient ID: Leslie [name], female   DOB: April 05, 1951, 72 y.o.   MRN: [id number] Pt s/p bilat sacroplasty yesterday; still with some lower back discomfort, knee pain but improved since procedure; no new sens/motor changes; afebrile, WBC nl, hgb stable, creat 1.2; puncture sites sacral region clean and dry, sl tender to palpation; may have cool/warm compress to lower back/sacral region; PT as tolerated

## 2023-01-08 NOTE — Progress Notes (Signed)
Physical Therapy Treatment Patient Details Name: Leslie [name] MRN: [id number] DOB: May 13, 1951 Today's Date: 01/08/2023   History of Present Illness 72 y.o. female with medical history significant of HFrEF with EF of 45%, atrial fibrillation on Eliquis, hypertension, obesity s/p gastric bypass, s/p nephrectomy, bil TKAs presented with a ground-level fall and severe lower back pain.  On presentation, hip x-ray showed osteopenia and degenerative changes.  CT of the lumbar spine without contrast showed acute mildly displaced fracture through the left S1 transverse process; acute nondisplaced fractures through the sacral alla bilaterally with moderate to severe spinal canal stenosis at L3-L4, L4-L5, L5 and S1.  Per neurosurgery note: "Pt may f/u on an outpt basis in regard to lumbar stenosis if becomes symptomatic. In regard to fractures, pt to participate in activity as tolerated. Consider IR consult for sacroplasty."  pt is now s/p sacroplasty on 01/07/23    PT Comments    Pt seen today with goal of increased mobility/OOB; pt demonstrates limited progress d/t severe pain with basic tasks.  Pt is able to perform repeated trials STS with 2 person assist. Returned to supine d/t pt's elevated pain.  Pt is Ox3 at time of PT session (confused earlier today per nursing staff) but  anxious regarding mobility and pain which is also limiting factor for mobility.  Continue PT POC. D/c plan remains appropriate.    Recommendations for follow up therapy are one component of a multi-disciplinary discharge planning process, led by the attending physician.  Recommendations may be updated based on patient status, additional functional criteria and insurance authorization.  Follow Up Recommendations  Can patient physically be transported by private vehicle: No    Assistance Recommended at Discharge Intermittent Supervision/Assistance  Patient can return home with the following Two people to help with walking  and/or transfers;Two people to help with bathing/dressing/bathroom;Assistance with cooking/housework;Direct supervision/assist for medications management;Assist for transportation;Help with stairs or ramp for entrance   Equipment Recommendations  None recommended by PT    Recommendations for Other Services       Precautions / Restrictions Precautions Precautions: Fall;Back Precaution Comments: back for comfort Restrictions Weight Bearing Restrictions: No     Mobility  Bed Mobility Overal bed mobility: Needs Assistance Bed Mobility: Rolling, Sidelying to Sit, Supine to Sit Rolling: Min assist Sidelying to sit: Mod assist   Sit to supine: Total assist, +2 for physical assistance, +2 for safety/equipment   General bed mobility comments: multi-modal cues for log roll techinique, transistion to sitting completed by pt pulling to sit on PT's hand; total assist of 2 for LEs and trunk descent to return to supine d/t pain    Transfers Overall transfer level: Needs assistance Equipment used: Rolling walker (2 wheels) Transfers: Sit to/from Stand Sit to Stand: Max assist, Mod assist, +2 physical assistance, +2 safety/equipment           General transfer comment: STS x4 with cues for powering up with LEs and trunk/hip extension, assist to control excessive anterior wt translation and maintain midline/upright. 3rd person called to room prior to 4th standing trial so bed could be moved and pt could sit further toward [facility] prior to return to supine    Ambulation/Gait               General Gait Details: unable d/t pain and weakness   Stairs             Wheelchair Mobility    Modified Rankin (Stroke Patients Only)  Balance                                            Cognition Arousal/Alertness: Awake/alert Behavior During Therapy: Anxious Overall Cognitive Status: Within Functional Limits for tasks assessed                                  General Comments: pt reports numbness in bil feet since sacral injury; pt is Ox3 but extremely anxious regarding movement and pain, requiring multi-modal cues to attend to and perform basic tasks. Per nursing staff pt was screaming in pain and disoriented earlier this morning.        Exercises General Exercises - Lower Extremity Heel Slides Limitations: pt is able to perform. L side limited by pain    General Comments        Pertinent Vitals/Pain Pain Assessment Pain Assessment: Faces Faces Pain Scale: Hurts worst Pain Location: L sacral area, L side, bil LEs and back Pain Descriptors / Indicators: Grimacing, Guarding, Stabbing, Moaning Pain Intervention(s): Limited activity within patient's tolerance, Monitored during session, Premedicated before session, Repositioned, Other (comment) (RN ordered Kpad)    Home Living                          Prior Function            PT Goals (current goals can now be found in the care plan section) Acute Rehab PT Goals Patient Stated Goal: decr pain PT Goal Formulation: With patient Time For Goal Achievement: 01/17/23 Potential to Achieve Goals: Fair Progress towards PT goals: Not progressing toward goals - comment (d/t pain with mobility)    Frequency    Min 1X/week      PT Plan Current plan remains appropriate    Co-evaluation              AM-PAC PT "6 Clicks" Mobility   Outcome Measure  Help needed turning from your back to your side while in a flat bed without using bedrails?: A Little Help needed moving from lying on your back to sitting on the side of a flat bed without using bedrails?: A Lot Help needed moving to and from a bed to a chair (including a wheelchair)?: Total Help needed standing up from a chair using your arms (e.g., wheelchair or bedside chair)?: Total Help needed to walk in hospital room?: Total Help needed climbing 3-5 steps with a railing? : Total 6 Click Score: 9     End of Session Equipment Utilized During Treatment: Gait belt Activity Tolerance: Patient limited by pain Patient left: with call bell/phone within reach;in bed;with bed alarm set Nurse Communication: Mobility status PT Visit Diagnosis: Difficulty in walking, not elsewhere classified (R26.2)     Time: 1610-9604 PT Time Calculation (min) (ACUTE ONLY): 15 min  Charges:  $Therapeutic Activity: 8-22 mins                     Delice Bison, PT  Acute Rehab Dept ([facility]) [phone]  01/08/2023    [facility] 01/08/2023, 1:29 PM

## 2023-01-08 NOTE — Progress Notes (Signed)
Patient agitated, anxious at this time. Able to verbalize she is at [facility], had a procedure on her back and it was the middle of June, but anxiously asking for her daughters and repeatedly crying out "I don't know what's happening, I don't know why this is happening to me." Repositioned patient for comfort, patient took am meds no difficulty. Will monitor closely for needs and safety.

## 2023-01-08 NOTE — TOC Progression Note (Signed)
Transition of Care [facility]) - Progression Note    Patient Details  Name: Leslie [name] MRN: [id number] Date of Birth: 25-Dec-1950  Transition of [facility]) CM/SW Contact  Amada Jupiter, LCSW Phone Number: 01/08/2023, 3:39 PM  Clinical Narrative:     Have provided SNF bed offers to pt/ daughter and await bed choice.  Expected Discharge Plan: Skilled Nursing Facility Barriers to Discharge: Continued Medical Work up, SNF Pending bed offer, English as a second language teacher  Expected Discharge Plan and Services In-house Referral: Clinical Social Work   Post Acute Care Choice: Skilled Nursing Facility Living arrangements for the past 2 months: Single Family Home                 DME Arranged: N/A DME Agency: NA                   Social Determinants of Health (SDOH) Interventions SDOH Screenings   Food Insecurity: No Food Insecurity (01/03/2023)  Housing: Low Risk  (01/03/2023)  Transportation Needs: No Transportation Needs (01/03/2023)  Utilities: Not At Risk (01/03/2023)  Alcohol Screen: Low Risk  (09/20/2022)  Depression (PHQ2-9): Low Risk  (06/17/2022)  Financial Resource Strain: Low Risk  (09/20/2022)  Physical Activity: Unknown (10/04/2021)  Social Connections: Unknown (10/04/2021)  Tobacco Use: Medium Risk (01/07/2023)    Readmission Risk Interventions     No data to display

## 2023-01-09 DIAGNOSIS — R2689 Other abnormalities of gait and mobility: Secondary | ICD-10-CM | POA: Diagnosis not present

## 2023-01-09 DIAGNOSIS — S3210XA Unspecified fracture of sacrum, initial encounter for closed fracture: Secondary | ICD-10-CM | POA: Diagnosis not present

## 2023-01-09 DIAGNOSIS — I129 Hypertensive chronic kidney disease with stage 1 through stage 4 chronic kidney disease, or unspecified chronic kidney disease: Secondary | ICD-10-CM | POA: Diagnosis not present

## 2023-01-09 DIAGNOSIS — M4807 Spinal stenosis, lumbosacral region: Secondary | ICD-10-CM | POA: Diagnosis not present

## 2023-01-09 DIAGNOSIS — M545 Low back pain, unspecified: Secondary | ICD-10-CM | POA: Diagnosis not present

## 2023-01-09 DIAGNOSIS — N179 Acute kidney failure, unspecified: Secondary | ICD-10-CM | POA: Diagnosis not present

## 2023-01-09 DIAGNOSIS — R531 Weakness: Secondary | ICD-10-CM | POA: Diagnosis not present

## 2023-01-09 DIAGNOSIS — I10 Essential (primary) hypertension: Secondary | ICD-10-CM | POA: Diagnosis not present

## 2023-01-09 DIAGNOSIS — R41841 Cognitive communication deficit: Secondary | ICD-10-CM | POA: Diagnosis not present

## 2023-01-09 DIAGNOSIS — G4700 Insomnia, unspecified: Secondary | ICD-10-CM | POA: Diagnosis not present

## 2023-01-09 DIAGNOSIS — I4891 Unspecified atrial fibrillation: Secondary | ICD-10-CM | POA: Diagnosis not present

## 2023-01-09 DIAGNOSIS — R293 Abnormal posture: Secondary | ICD-10-CM | POA: Diagnosis not present

## 2023-01-09 DIAGNOSIS — F909 Attention-deficit hyperactivity disorder, unspecified type: Secondary | ICD-10-CM | POA: Diagnosis not present

## 2023-01-09 DIAGNOSIS — M48061 Spinal stenosis, lumbar region without neurogenic claudication: Secondary | ICD-10-CM | POA: Diagnosis not present

## 2023-01-09 DIAGNOSIS — E559 Vitamin D deficiency, unspecified: Secondary | ICD-10-CM

## 2023-01-09 DIAGNOSIS — S3210XD Unspecified fracture of sacrum, subsequent encounter for fracture with routine healing: Secondary | ICD-10-CM | POA: Diagnosis not present

## 2023-01-09 DIAGNOSIS — I5042 Chronic combined systolic (congestive) and diastolic (congestive) heart failure: Secondary | ICD-10-CM | POA: Diagnosis not present

## 2023-01-09 DIAGNOSIS — F03918 Unspecified dementia, unspecified severity, with other behavioral disturbance: Secondary | ICD-10-CM | POA: Diagnosis not present

## 2023-01-09 DIAGNOSIS — I5020 Unspecified systolic (congestive) heart failure: Secondary | ICD-10-CM | POA: Diagnosis not present

## 2023-01-09 DIAGNOSIS — Z7401 Bed confinement status: Secondary | ICD-10-CM | POA: Diagnosis not present

## 2023-01-09 DIAGNOSIS — M6281 Muscle weakness (generalized): Secondary | ICD-10-CM | POA: Diagnosis not present

## 2023-01-09 DIAGNOSIS — E785 Hyperlipidemia, unspecified: Secondary | ICD-10-CM | POA: Diagnosis not present

## 2023-01-09 DIAGNOSIS — I482 Chronic atrial fibrillation, unspecified: Secondary | ICD-10-CM | POA: Diagnosis not present

## 2023-01-09 DIAGNOSIS — F32A Depression, unspecified: Secondary | ICD-10-CM | POA: Diagnosis not present

## 2023-01-09 DIAGNOSIS — F411 Generalized anxiety disorder: Secondary | ICD-10-CM | POA: Diagnosis not present

## 2023-01-09 DIAGNOSIS — G63 Polyneuropathy in diseases classified elsewhere: Secondary | ICD-10-CM | POA: Diagnosis not present

## 2023-01-09 LAB — BASIC METABOLIC PANEL
Anion gap: 10 (ref 5–15)
BUN: 32 mg/dL — ABNORMAL HIGH (ref 8–23)
CO2: 24 mmol/L (ref 22–32)
Calcium: 9.5 mg/dL (ref 8.9–10.3)
Chloride: 99 mmol/L (ref 98–111)
Creatinine, Ser: 1.14 mg/dL — ABNORMAL HIGH (ref 0.44–1.00)
GFR, Estimated: 51 mL/min — ABNORMAL LOW (ref >60)
Glucose, Bld: 108 mg/dL — ABNORMAL HIGH (ref 70–99)
Potassium: 4.5 mmol/L (ref 3.5–5.1)
Sodium: 133 mmol/L — ABNORMAL LOW (ref 135–145)

## 2023-01-09 MED ORDER — LIDOCAINE 5 % EX PTCH: 2.0000 | MEDICATED_PATCH | CUTANEOUS | 0 refills | Status: DC

## 2023-01-09 MED ORDER — MELATONIN 3 MG PO TABS: 3.0000 mg | ORAL_TABLET | Freq: Every day | ORAL | Status: DC

## 2023-01-09 MED ORDER — VITAMIN D (ERGOCALCIFEROL) 1.25 MG (50000 UNIT) PO CAPS: 50000.0000 [IU] | ORAL_CAPSULE | ORAL | 0 refills | Status: DC

## 2023-01-09 MED ORDER — POLYETHYLENE GLYCOL 3350 17 G PO PACK: 17.0000 g | PACK | Freq: Two times a day (BID) | ORAL | Status: DC

## 2023-01-09 MED ORDER — HYDROCODONE-ACETAMINOPHEN 5-325 MG PO TABS: 1.0000 | ORAL_TABLET | ORAL | 0 refills | Status: DC | PRN

## 2023-01-09 MED ORDER — FENTANYL 25 MCG/HR TD PT72
1.0000 | MEDICATED_PATCH | TRANSDERMAL | Status: DC
  Administered 2023-01-09: 1 via TRANSDERMAL
  Filled 2023-01-09: qty 1

## 2023-01-09 MED ORDER — ALPRAZOLAM 0.25 MG PO TABS: 0.2500 mg | ORAL_TABLET | Freq: Every evening | ORAL | 0 refills | Status: DC | PRN

## 2023-01-09 MED ORDER — SENNOSIDES-DOCUSATE SODIUM 8.6-50 MG PO TABS: 2.0000 | ORAL_TABLET | Freq: Two times a day (BID) | ORAL | Status: DC

## 2023-01-09 MED ORDER — MELATONIN 3 MG PO TABS: 3.0000 mg | ORAL_TABLET | Freq: Every day | ORAL | 0 refills | Status: AC

## 2023-01-09 MED ORDER — QUETIAPINE FUMARATE 25 MG PO TABS: 12.5000 mg | ORAL_TABLET | Freq: Every day | ORAL | Status: DC

## 2023-01-09 MED ORDER — FENTANYL 25 MCG/HR TD PT72: 1.0000 | MEDICATED_PATCH | TRANSDERMAL | 0 refills | Status: DC

## 2023-01-09 MED ORDER — POLYETHYLENE GLYCOL 3350 17 G PO PACK: 17.0000 g | PACK | Freq: Every day | ORAL | 0 refills | Status: DC | PRN

## 2023-01-09 MED ORDER — METHOCARBAMOL 500 MG PO TABS: 500.0000 mg | ORAL_TABLET | Freq: Four times a day (QID) | ORAL | 0 refills | Status: DC | PRN

## 2023-01-09 MED ORDER — AMPHETAMINE-DEXTROAMPHETAMINE 20 MG PO TABS: 20.0000 mg | ORAL_TABLET | Freq: Every day | ORAL | 0 refills | Status: DC

## 2023-01-09 MED ORDER — ZOLPIDEM TARTRATE 10 MG PO TABS
10.0000 mg | ORAL_TABLET | Freq: Every day | ORAL | 0 refills | Status: DC
Start: 2023-01-09 — End: 2023-01-30

## 2023-01-09 MED ORDER — AMPHETAMINE-DEXTROAMPHET ER 30 MG PO CP24: 30.0000 mg | ORAL_CAPSULE | ORAL | 0 refills | Status: DC

## 2023-01-09 MED ORDER — HALOPERIDOL LACTATE 5 MG/ML IJ SOLN: 1.0000 mg | Freq: Four times a day (QID) | INTRAMUSCULAR | Status: DC | PRN

## 2023-01-09 NOTE — TOC Transition Note (Signed)
Transition of Care [facility]) - CM/SW Discharge Note   Patient Details  Name: Leslie [name] MRN: [id number] Date of Birth: November 15, 1950  Transition of Care [facility]) CM/SW Contact:  Amada Jupiter, LCSW Phone Number: 01/09/2023, 12:05 PM   Clinical Narrative:    Pt/ family have accepted SNF bed with [facility] and pt medically cleared for dc today.  Have received insurance authorization.  Pt and family aware and agreeable.  PTAR set up for 2:30pm pick up today.  RN to call report to [phone].  No further TOC needs.   Final next level of care: Skilled Nursing Facility Barriers to Discharge: Barriers Resolved   Patient Goals and CMS Choice CMS Medicare.gov Compare Post Acute Care list provided to:: Patient Choice offered to / list presented to : Patient  Discharge Placement PASRR number recieved: 01/06/23 PASRR number recieved: 01/06/23            Patient chooses bed at: [facility] Patient to be transferred to facility by: PTAR Name of family member notified: daughter, Nehemiah Settle Patient and family notified of of transfer: 01/09/23  Discharge Plan and Services Additional resources added to the After Visit Summary for   In-house Referral: Clinical Social Work   Post Acute Care Choice: Skilled Nursing Facility          DME Arranged: N/A DME Agency: NA                  Social Determinants of Health (SDOH) Interventions SDOH Screenings   Food Insecurity: No Food Insecurity (01/03/2023)  Housing: Low Risk  (01/03/2023)  Transportation Needs: No Transportation Needs (01/03/2023)  Utilities: Not At Risk (01/03/2023)  Alcohol Screen: Low Risk  (09/20/2022)  Depression (PHQ2-9): Low Risk  (06/17/2022)  Financial Resource Strain: Low Risk  (09/20/2022)  Physical Activity: Unknown (10/04/2021)  Social Connections: Unknown (10/04/2021)  Tobacco Use: Medium Risk (01/07/2023)     Readmission Risk Interventions     No data to display

## 2023-01-09 NOTE — Progress Notes (Signed)
Nurse from [place] called back and and report given.

## 2023-01-09 NOTE — Plan of Care (Signed)
?  Problem: Activity: ?Goal: Risk for activity intolerance will decrease ?Outcome: Progressing ?  ?Problem: Safety: ?Goal: Ability to remain free from injury will improve ?Outcome: Progressing ?  ?Problem: Pain Managment: ?Goal: General experience of comfort will improve ?Outcome: Progressing ?  ?

## 2023-01-09 NOTE — Care Management Important Message (Signed)
Important Message  Patient Details IM Letter given. Name: Leslie [name] MRN: [id number] Date of Birth: August 31, 1950   Medicare Important Message Given:  Yes     Caren Macadam 01/09/2023, 4:16 PM

## 2023-01-09 NOTE — Progress Notes (Signed)
Nutrition Follow-up  DOCUMENTATION CODES:   Non-severe (moderate) malnutrition in context of chronic illness  INTERVENTION:  - Heart Healthy Diet.  [name] 1.4 po daily, each supplement provides 455 kcal and 20 grams protein.     - Continue current vitamin regimen.    - Bariatric vitamin and mineral information in AVS.   NUTRITION DIAGNOSIS:   Moderate Malnutrition related to chronic illness (h/o gastric bypass) as evidenced by mild fat depletion, mild muscle depletion. *ongoing  GOAL:   Patient will meet greater than or equal to 90% of their needs *progressing  MONITOR:   PO intake, Supplement acceptance, Labs, Weight trends, I & O's  REASON FOR ASSESSMENT:   Consult Assessment of nutrition requirement/status  ASSESSMENT:   72 y.o. female with medical history significant of HFrEF with EF of 45%, atrial fibrillation on Eliquis, hypertension, obesity s/p gastric bypass, s/p nephrectomy presented with a ground-level fall and severe lower back pain.  On presentation, hip x-ray showed osteopenia and degenerative changes.  Patient reports always ordering 3 meals a day but not always able to eat very much of them. Documented to be consuming 0-75% over the past week, 75% over the past 2 days. She notes she ate the best she has since admission this morning after having a large bowel movement. Felt much better after this and her appetite quickly improved.  Drinking Molli Posey once daily, really enjoys them. Encouraged patient to continue to eat well. She is hopeful for discharge soon.    Medications reviewed and include: 1mg  folic acid, MVI, 100mg  thiamine, 50,000 units vitamin D (weekly), Lasix, Miralax, Senokot  Labs reviewed:  Na 133 Creatinine 1.14   Diet Order:   Diet Order             Diet - low sodium heart healthy           Diet Heart Room service appropriate? Yes; Fluid consistency: Thin  Diet effective now                   EDUCATION NEEDS:   Education needs have been addressed  Skin:  Skin Assessment: Reviewed RN Assessment  Last BM:  6/20  Height:  Ht Readings from Last 1 Encounters:  01/02/23 5\' 4"  (1.626 m)   Weight:  Wt Readings from Last 1 Encounters:  01/09/23 84.4 kg    BMI:  Body mass index is 31.94 kg/m.  Estimated Nutritional Needs:  Kcal:  1450-1650 Protein:  75-85g Fluid:  1.7L/day    Leslie [name] RD, LDN For contact information, refer to [facility].

## 2023-01-09 NOTE — Progress Notes (Signed)
Called to give report. Msg left to give nurse Nicholos Johns to call back.

## 2023-01-09 NOTE — Discharge Summary (Addendum)
Physician Discharge Summary  Leslie [name] WGN:[id number] DOB: November 03, 1950 DOA: 01/02/2023  PCP: Philip Aspen, Limmie Patricia, MD  Admit date: 01/02/2023 Discharge date: 01/09/2023  Admitted From: Home Disposition: [facility]  Recommendations for Outpatient Follow-up:  Follow up with PCP in 1-2 weeks Outpatient follow-up with neurosurgery for moderate/severe lumbar spinal stenosis Started on ergocalciferol 50,000 units weekly for vitamin D deficiency Recommend repeat BMP 1 week to ensure renal function remained stable Recommend repeat vitamin D level in 6-12 weeks Consideration of neurocognitive testing outpatient with concern of possible underlying cognitive impairment/dementia.  Discharge Condition: Stable CODE STATUS: Full code Diet recommendation: Heart healthy diet  History of present illness:  Leslie [name] is a 72 y.o. female with past medical history significant for chronic systolic congestive heart failure, paroxysmal atrial fibrillation on anticoagulation, ADHD, HTN, obesity s/p gastric bypass, s/p nephrectomy who presented to [facility] ED on 6/13 with severe lower back pain following a ground-level fall.  Workup in the ED with hip x-ray showing osteopenia and degenerative changes, CT L-spine without contrast with acute mildly displaced fracture through the left S1 transverse process, acute nondisplaced fracture through the sacral ala bilaterally with moderate to severe spinal canal stenosis at L3-L4, L4-L5, L5 and S1.  EDP discussed case with neurosurgery who recommended MR L-spine.  TRH consulted for admission.  MR L-spine notable for moderate/severe stenosis L4-5, L5-S1.  Neurosurgery recommends follow-up outpatient basis if becomes symptomatic.  Interventional radiology was consulted and patient underwent sacroplasty on 01/07/2023.    Hospital course:  Sacral fracture Intractable low back pain following mechanical fall Patient presenting to ED following  mechanical fall with acute low back pain. hip x-ray showing osteopenia and degenerative changes, CT L-spine without contrast with acute mildly displaced fracture through the left S1 transverse process, acute nondisplaced fracture through the sacral ala bilaterally with moderate to severe spinal canal stenosis at L3-L4, L4-L5, L5 and S1.  MR L-spine notable for moderate/severe stenosis L4-5, L5-S1.  Neurosurgery was consulted and recommended outpatient follow-up in regards to the stenosis.  Interventional radiology was consulted and patient underwent sacroplasty on 01/07/2023.  Continue lidocaine patch, Norco, fentanyl patch.  Discharging to SNF.   Moderate/severe lumbar spinal stenosis MRI of lumbar spine showed severe L5-S1 spinal stenosis with moderate L4-L5 spinal stenosis and mild to moderate L3-L4 spinal stenosis. Neurosurgery recommending outpatient follow-up and recommended IR evaluation for possible sacroplasty.  Continue lidocaine patch/Norco.  Outpatient follow-up with neurosurgery.   Acute renal failure on CKD stage IIIb Acute metabolic acidosis Creatinine elevated 2.1 on admission, etiology likely secondary to prerenal azotemia in the setting of dehydration.  Treated with IV fluids which have now been discontinued.  Creatinine improved to 1.14 at time of discharge.   Chronic combined systolic and diastolic congestive heart failure, compensated Hx essential hypertension Follows with heart failure team outpatient.  Most recent TTE 12/19/2022 with LVEF 45%, LV mildly decreased function, grade 1 diastolic dysfunction, LA mildly dilated, IVC normal in size, no aortic stenosis. Metoprolol succinate 50 mg p.o. daily, Furosemide 40 mg p.o. daily, losartan, spironolactone   Paroxysmal atrial fibrillation Amiodarone 200 mg p.o. daily, continue apixaban. Plan for atrial fibrillation ablation with Dr. Nelly Laurence in September 2024   Hyperlipidemia Atorvastatin 40 mg p.o. daily    Depression/anxiety: Prozac 40 mg p.o. daily, Xanax 0.25 mg p.o. nightly as needed   Obesity s/p gastric bypass Outpatient follow-up with PCP.   Vitamin D deficiency Vitamin D 25-hydroxy level 23.11.  Started on ergocalciferol 50,000 units orally  every 7 days. Recommend repeat vitamin D level in 6-12 weeks   ADHD Resume Adderall on discharge  Concern for cognitive impairment/mild dementia During the hospitalization patient had episodes of sundowning, hallucinations.  Query underlying cognitive impairment versus mild early dementia that has been brought on by/exposed by her acute illness/pain.  Recommend outpatient follow-up with PCP for further consideration of outpatient neurocognitive testing once acute illness has improved.  Discharge Diagnoses:  Principal Problem:   Sacral fracture (HCC) Active Problems:   AKI (acute kidney injury) (HCC)   Chronic systolic CHF (congestive heart failure) (HCC)   Essential hypertension   S/P gastric bypass   Malnutrition of moderate degree    Discharge Instructions  Discharge Instructions     Call MD for:  difficulty breathing, headache or visual disturbances   Complete by: As directed    Call MD for:  extreme fatigue   Complete by: As directed    Call MD for:  persistant dizziness or light-headedness   Complete by: As directed    Call MD for:  persistant nausea and vomiting   Complete by: As directed    Call MD for:  severe uncontrolled pain   Complete by: As directed    Call MD for:  temperature >100.4   Complete by: As directed    Diet - low sodium heart healthy   Complete by: As directed    Increase activity slowly   Complete by: As directed       Allergies as of 01/09/2023       Reactions   Amoxicillin    Hives   Sulfamethoxazole Nausea And Vomiting   See patient list of med intolerances due to h/o gastric bypass and nephrectomy        Medication List     STOP taking these medications    Vitamin D3 50 MCG (2000  UT) Tabs       TAKE these medications    acetaminophen 500 MG tablet Commonly known as: TYLENOL Take 1,000 mg by mouth every 6 (six) hours as needed for moderate pain or mild pain.   ALPRAZolam 0.25 MG tablet Commonly known as: XANAX Take 1 tablet (0.25 mg total) by mouth at bedtime as needed for anxiety. TAKE 1 TABLET(0.25 MG) BY MOUTH AT BEDTIME AS NEEDED FOR ANXIETY Strength: 0.25 mg What changed: See the new instructions.   amiodarone 200 MG tablet Commonly known as: PACERONE Take 200 mg by mouth daily.   amphetamine-dextroamphetamine 30 MG 24 hr capsule Commonly known as: ADDERALL XR Take 1 capsule (30 mg total) by mouth every morning.   amphetamine-dextroamphetamine 20 MG tablet Commonly known as: Adderall Take 1 tablet (20 mg total) by mouth daily.   apixaban 5 MG Tabs tablet Commonly known as: ELIQUIS Take 1 tablet (5 mg total) by mouth 2 (two) times daily.   atorvastatin 40 MG tablet Commonly known as: LIPITOR Take 1 tablet (40 mg total) by mouth daily.   dapagliflozin propanediol 10 MG Tabs tablet Commonly known as: Farxiga Take 1 tablet (10 mg total) by mouth daily before breakfast.   fentaNYL 25 MCG/HR Commonly known as: DURAGESIC Place 1 patch onto the skin every 3 (three) days.   FLUoxetine 40 MG capsule Commonly known as: PROZAC TAKE 1 CAPSULE(40 MG) BY MOUTH EVERY MORNING What changed: See the new instructions.   furosemide 40 MG tablet Commonly known as: LASIX Take 20 mg by mouth as needed. Take one half (0.5) tablet by mouth ( 20 mg) as needed for  wt gain of 3 lbs in 24 hours or 5 lbs in one week.   HYDROcodone-acetaminophen 5-325 MG tablet Commonly known as: NORCO/VICODIN Take 1-2 tablets by mouth every 4 (four) hours as needed for moderate pain or severe pain.   lamoTRIgine 100 MG tablet Commonly known as: LAMICTAL TAKE 1 TABLET(100 MG) BY MOUTH TWICE DAILY What changed: See the new instructions.   lidocaine 5 % Commonly known as:  LIDODERM Place 2 patches onto the skin daily. Remove & Discard patch within 12 hours or as directed by MD   losartan 25 MG tablet Commonly known as: COZAAR Take 0.5 tablets (12.5 mg total) by mouth daily.   melatonin 3 MG Tabs tablet Take 1 tablet (3 mg total) by mouth at bedtime.   methocarbamol 500 MG tablet Commonly known as: ROBAXIN Take 1 tablet (500 mg total) by mouth every 6 (six) hours as needed for muscle spasms.   metoprolol succinate 50 MG 24 hr tablet Commonly known as: TOPROL-XL Take 1 tablet (50 mg total) by mouth at bedtime. Take with or immediately following a meal.   polyethylene glycol 17 g packet Commonly known as: MIRALAX / GLYCOLAX Take 17 g by mouth daily as needed.   potassium chloride 10 MEQ tablet Commonly known as: KLOR-CON Take 10 mEq by mouth daily.   PreserVision AREDS 2 Caps Take 1 capsule by mouth 2 (two) times daily.   senna-docusate 8.6-50 MG tablet Commonly known as: Senokot-S Take 2 tablets by mouth 2 (two) times daily.   spironolactone 25 MG tablet Commonly known as: Aldactone Take 0.5 tablets (12.5 mg total) by mouth daily.   Vitamin D (Ergocalciferol) 1.25 MG (50000 UNIT) Caps capsule Commonly known as: DRISDOL Take 1 capsule (50,000 Units total) by mouth every 7 (seven) days. Start taking on: January 10, 2023   zolpidem 10 MG tablet Commonly known as: AMBIEN Take 1 tablet (10 mg total) by mouth at bedtime. TAKE 1 TABLET(10 MG) BY MOUTH AT BEDTIME What changed: See the new instructions.        Contact information for follow-up providers     Philip Aspen, Limmie Patricia, MD. Schedule an appointment as soon as possible for a visit in 1 week(s).   Specialty: Internal Medicine Contact information: [address] [place] [address] [phone]         [facility]. Schedule an appointment as soon as possible for a visit.   Specialty: Neurosurgery Contact information: [address] [place] [address] [phone]              Contact information for after-discharge care     Destination     [facility] Preferred SNF .   Service: Skilled Nursing Contact information: [address] [place] [address] [phone]                    Allergies  Allergen Reactions   Amoxicillin     Hives   Sulfamethoxazole Nausea And Vomiting    See patient list of med intolerances due to h/o gastric bypass and nephrectomy    Consultations: Neurosurgery Interventional radiology   Procedures/Studies: IR SACROPLASTY BILATERAL  Result Date: 01/07/2023 INDICATION: Leslie [name] is a 72 y.o. female, ex smoker, with past medical history significant for ADD, anemia, osteoarthritis, hypertension, heart failure, atrial fibrillation on Eliquis, hypertension, hyperlipidemia , vitamin B12/D deficiency, obesity with prior gastric bypass 2003, prior nephrectomy who was admitted to [facility] on 6/13 following ground-level fall with subsequent severe lower back pain. Hip x-ray showed osteopenia and degenerative changes. CT of the lumbar spine without contrast showed acute mildly displaced fracture through the left S1 transverse process; acute nondisplaced fractures through the sacral alla bilaterally.Patient continues to have severe lower back/sacral pain despite IV/p.o. narcotics with radiation of pain down both lower extremities, greater on the left. Pain exacerbated by movement. She comes to our service today for bilateral sacroplasty. EXAM: FLUOROSCOPY GUIDED BILATERAL SACROPLASTY MEDICATIONS: As antibiotic prophylaxis, Vancomycin 1 g IV was ordered pre-procedure and administered intravenously within 1 hour of incision. All current medications are in the EMR and have been reviewed as part of this encounter. ANESTHESIA/SEDATION: Moderate (conscious) sedation was employed during this procedure. A total  of Versed 2.5 mg and Fentanyl 125 mcg were administered intravenously for moderate conscious sedation monitored under my direct supervision. Total intraservice time of sedation was for 3 minutes. The patient's vital signs were monitored throughout the procedure and recorded in the patient's medical record by the radiology nurse. FLUOROSCOPY: Radiation Exposure Index (as provided by the fluoroscopic device): PSD 2,394 mGy Kerma COMPLICATIONS: None immediate. TECHNIQUE: Informed written consent was obtained from the patient after a thorough discussion of the procedural risks, benefits and alternatives. All questions were addressed. Maximal Sterile Barrier Technique was utilized including caps, mask, sterile gowns, sterile gloves, sterile drape, hand hygiene and skin antiseptic. A timeout was performed prior to the initiation of the procedure. PROCEDURE: The patient was placed in prone position on the angiography table. The sacral region was prepped and draped in a sterile fashion. Under fluoroscopy, the right sacral ala was delineated and the skin area was marked. The skin was infiltrated with a 1% Lidocaine at the expected entry site. Using a 22-gauge spinal needle, the soft issue and periosteum were infiltrated with Bupivacaine 0.5%. A skin incision was made at the access site. Subsequently, an 11-gauge Kyphon trocar was inserted under fluoroscopic guidance until contact with the posterior S3 cortex. The trocar was inserted under light hammer tapping on in a longitudinal fashion along the sacral ala until S2 was reached. The diamond mandrill was removed. Under fluoroscopy, the left sacral ala was delineated and the skin area was marked. The skin was infiltrated with a 1% Lidocaine at the expected entry site. Using a 22-gauge spinal needle, the soft issue and periosteum were infiltrated with Bupivacaine 0.5%. A skin incision was made at the access site. Subsequently, an 11-gauge Kyphon trocar was inserted under  fluoroscopic guidance until contact with the posterior S3 cortex. The trocar was inserted under light hammer tapping on in a longitudinal fashion along the sacral ala until S2 was reached. The diamond mandrill was removed. A bone drill was coaxially advanced to the S1 level on each side. Under continuous fluoroscopy guidance in the AP and lateral views, the bilateral sacral ala were filled with previously mixed polymethyl-methacrylate (PMMA) added to barium for opacification. Both cannulas were later removed. Hemostasis was achieved at the skin entry site. The access sites were cleaned and covered with a sterile bandage. There were no acute complications. Patient tolerated the procedure well. The patient was transferred back to [facility]. IMPRESSION: 1. Successful bilateral sacral plasty for treatment of bilateral insufficiency/traumatic fractures with intractable pain. 2. If the patient has known osteoporosis, recommend treatment as clinically indicated. If the patient's bone density status is unknown, DEXA scan is recommended. Electronically Signed   By: Baldemar Lenis M.D.   On:  01/07/2023 16:41   MR LUMBAR SPINE WO CONTRAST  Result Date: 01/02/2023 CLINICAL DATA:  Fall, back pain, sacral fractures EXAM: MRI LUMBAR SPINE WITHOUT CONTRAST TECHNIQUE: Multiplanar, multisequence MR imaging of the lumbar spine was performed. No intravenous contrast was administered. COMPARISON:  01/02/2023 CT lumbar spine, 06/04/2021 MRI lumbar spine FINDINGS: Segmentation: In keeping with the same day CT lumbar spine and in correlation with the 08/07/2022 CT chest, there are 6 non rib-bearing vertebral bodies, the last of which is labeled S1, which is lumbarized. Alignment:  5 mm anterolisthesis of L5 on S1. Vertebrae: Increased T2 signal in the left sacral ala (series 6, image 13), and in the central sacrum (series 6, image 9), consistent with the fractures that are better seen on the same-day CT lumbar  spine. No acute fracture in the lumbar spine. Conus medullaris and cauda equina: Conus extends to the L1-L2 level. Conus and cauda equina appear normal. Paraspinal and other soft tissues: Small renal cysts, for which no follow-up is currently indicated. The left kidney is likely surgically absent. No lymphadenopathy. Disc levels: T12-L1: Seen only on the sagittal images. No significant disc bulge, spinal canal stenosis, or neural foraminal narrowing. L1-L2: Minimal disc bulge. No spinal canal stenosis or neural foraminal narrowing. L2-L3: Minimal disc bulge. No spinal canal stenosis or neural foraminal narrowing. L3-L4: Mild disc bulge. Mild facet arthropathy. Ligamentum flavum hypertrophy. Narrowing of the lateral recesses. Mild to moderate spinal canal stenosis. Mild right neural foraminal narrowing. L4-L5: Moderate disc bulge. Moderate facet arthropathy. Ligamentum flavum hypertrophy. Narrowing of the lateral recesses. Moderate spinal canal stenosis. No neural foraminal narrowing. L5-S1: Grade 1 anterolisthesis with disc unroofing. Moderate disc bulge. Severe facet arthropathy. Ligamentum flavum hypertrophy. Effacement of the lateral recesses. Severe spinal canal stenosis. No neural foraminal narrowing. S1-S2: Minimal disc bulge with right greater than left lateral disc osteophyte complex, which may contact the exiting right S1 nerve. Mild facet arthropathy. No spinal canal stenosis or neural foraminal narrowing. IMPRESSION: 1. Sacral fractures are better seen on the same-day CT lumbar spine. No acute fracture in the lumbar spine. 2. L5-S1 severe spinal canal stenosis. Effacement of the lateral recesses at this level likely compresses the descending S1 nerve roots. 3. L4-L5 moderate spinal canal stenosis. Narrowing of the lateral recesses at this level could affect the descending L5 nerve roots. 4. L3-L4 mild to moderate spinal canal stenosis and mild right neural foraminal narrowing. Narrowing of the lateral  recesses at this level could affect the descending L4 nerve roots. Electronically Signed   By: Wiliam Ke M.D.   On: 01/02/2023 23:16   CT Lumbar Spine Wo Contrast  Result Date: 01/02/2023 CLINICAL DATA:  Low back pain EXAM: CT LUMBAR SPINE WITHOUT CONTRAST TECHNIQUE: Multidetector CT imaging of the lumbar spine was performed without intravenous contrast administration. Multiplanar CT image reconstructions were also generated. RADIATION DOSE REDUCTION: This exam was performed according to the departmental dose-optimization program which includes automated exposure control, adjustment of the mA and/or kV according to patient size and/or use of iterative reconstruction technique. COMPARISON:  MR L Spine 06/04/21 FINDINGS: Segmentation: 6 non-rib-bearing vertebral bodies with lumbarization of S1. Alignment: Grade 1 anterolisthesis of L5 on S1. Vertebrae: Mildly displaced fracture through the left S1 transverse process (series 5, image 44). There are nondisplaced fractures through the sacral ala bilaterally extending into the bilateral SI joints (series 3, image 109, 110). Paraspinal and other soft tissues: Aortic atherosclerotic calcifications. 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 Left kidney is not visualized. Disc  levels: Moderate to severe spinal canal stenosis at L3-L4, L4-L5, and L5-S1. 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 1. Acute mildly displaced fracture through the left S1 transverse process. 2. Acute nondisplaced fractures through the sacral ala bilaterally extending into the bilateral SI joints. 3. Moderate to severe spinal canal stenosis at L3-L4, L4-L5, and L5-S1. Stable to slightly progressed compared to 2022. Aortic Atherosclerosis (ICD10-I70.0). Electronically Signed   By: Lorenza Cambridge M.D.   On: 01/02/2023 17:03   DG Hip Unilat W or Wo Pelvis 2-3 Views Left  Result Date: 01/02/2023 CLINICAL DATA:   Pain after fall EXAM: DG HIP (WITH OR WITHOUT PELVIS) 3V LEFT COMPARISON:  None Available. FINDINGS: Osteopenia. Preserved joint spaces. Hyperostosis. No obvious fracture. Mild degenerative changes of the sacroiliac joints. Soft tissue calcifications along the upper medial thigh regions bilaterally, nonspecific. With this level of osteopenia evaluation for subtle nondisplaced injury is difficult to completely exclude and if needed cross-sectional imaging as clinically appropriate. IMPRESSION: Osteopenia with degenerative changes. Electronically Signed   By: Karen Kays M.D.   On: 01/02/2023 16:33   ECHOCARDIOGRAM COMPLETE  Result Date: 12/19/2022    ECHOCARDIOGRAM REPORT   Patient Name:   Leslie [name] [facility] Date of Exam: 12/19/2022 Medical Rec #:  [id number]         Height:       64.0 in Accession #:    [id number]        Weight:       189.0 lb Date of Birth:  1951/07/15        BSA:          1.910 m Patient Age:    71 years          BP:           130/70 mmHg Patient Gender: F                 HR:           51 bpm. Exam Location:  [address] Procedure: 2D Echo, 3D Echo, Cardiac Doppler, Color Doppler and Strain Analysis Indications:    I42.9 Cardiomyopathy (unspecified)  History:        Patient has prior history of Echocardiogram examinations, most                 recent 08/29/2022. CHF, Arrythmias:Atrial Fibrillation; Risk                 Factors:Hypertension. Obesity.  Sonographer:    Cathie Beams RCS Referring Phys: [id number] PAULA V ROSS IMPRESSIONS  1. Left ventricular ejection fraction, by estimation, is 45%. The left ventricle has mildly decreased function. The left ventricle has no regional wall motion abnormalities. Left ventricular diastolic parameters are consistent with Grade I diastolic dysfunction (impaired relaxation). GLS -15.7%.  2. Right ventricular systolic function is normal. The right ventricular size is normal.  3. Left atrial size was mildly dilated.  4. The mitral valve is normal in structure.  Mild mitral valve regurgitation. No evidence of mitral stenosis.  5. The aortic valve is normal in structure. Aortic valve regurgitation is mild. No aortic stenosis is present.  6. The inferior vena cava is normal in size with greater than 50% respiratory variability, suggesting right atrial pressure of 3 mmHg. FINDINGS  Left Ventricle: Left ventricular ejection fraction, by estimation, is 40 to 45%. The left ventricle has mildly decreased function. The left ventricle has no regional wall motion abnormalities. The left ventricular  internal cavity size was normal in size. There is no left ventricular hypertrophy. Left ventricular diastolic parameters are consistent with Grade I diastolic dysfunction (impaired relaxation). Right Ventricle: The right ventricular size is normal. No increase in right ventricular wall thickness. Right ventricular systolic function is normal. Left Atrium: Left atrial size was mildly dilated. Right Atrium: Right atrial size was normal in size. Pericardium: There is no evidence of pericardial effusion. Mitral Valve: The mitral valve is normal in structure. Mild mitral valve regurgitation. No evidence of mitral valve stenosis. Tricuspid Valve: The tricuspid valve is normal in structure. Tricuspid valve regurgitation is trivial. No evidence of tricuspid stenosis. Aortic Valve: The aortic valve is normal in structure. Aortic valve regurgitation is mild. Aortic regurgitation PHT measures 1246 msec. No aortic stenosis is present. Pulmonic Valve: The pulmonic valve was normal in structure. Pulmonic valve regurgitation is trivial. No evidence of pulmonic stenosis. Aorta: The aortic root is normal in size and structure. Venous: The inferior vena cava is normal in size with greater than 50% respiratory variability, suggesting right atrial pressure of 3 mmHg. IAS/Shunts: No atrial level shunt detected by color flow Doppler.  LEFT VENTRICLE PLAX 2D LVIDd:         5.25 cm   Diastology LVIDs:          3.80 cm   LV e' medial:    5.18 cm/s LV PW:         1.20 cm   LV E/e' medial:  14.6 LV IVS:        1.00 cm   LV e' lateral:   7.95 cm/s LVOT diam:     2.10 cm   LV E/e' lateral: 9.5 LV SV:         68 LV SV Index:   35 LVOT Area:     3.46 cm                           3D Volume EF:                          3D EF:        50 %                          LV EDV:       180 ml                          LV ESV:       90 ml                          LV SV:        90 ml RIGHT VENTRICLE RV Basal diam:  2.70 cm RV S prime:     7.73 cm/s TAPSE (M-mode): 1.8 cm RVSP:           20.0 mmHg LEFT ATRIUM              Index        RIGHT ATRIUM           Index LA diam:        3.80 cm  1.99 cm/m   RA Pressure: 3.00 mmHg LA Vol (A2C):   100.0 ml 52.36 ml/m  RA Area:     10.40 cm LA Vol (A4C):   48.1  ml  25.18 ml/m  RA Volume:   24.80 ml  12.98 ml/m LA Biplane Vol: 72.9 ml  38.17 ml/m  AORTIC VALVE LVOT Vmax:   81.30 cm/s LVOT Vmean:  51.900 cm/s LVOT VTI:    0.195 m AI PHT:      1246 msec  AORTA Ao Root diam: 3.40 cm Ao Asc diam:  3.40 cm MITRAL VALVE               TRICUSPID VALVE MV Area (PHT): 1.34 cm    TR Peak grad:   17.0 mmHg MV Decel Time: 566 msec    TR Vmax:        206.00 cm/s MR Peak grad: 71.2 mmHg    Estimated RAP:  3.00 mmHg MR Mean grad: 30.0 mmHg    RVSP:           20.0 mmHg MR Vmax:      422.00 cm/s MR Vmean:     229.0 cm/s   SHUNTS MV E velocity: 75.50 cm/s  Systemic VTI:  0.20 m MV A velocity: 97.30 cm/s  Systemic Diam: 2.10 cm MV E/A ratio:  0.78 Aditya Sabharwal Electronically signed by Dorthula Nettles Signature Date/Time: 12/19/2022/3:00:37 PM    Final      Subjective: Patient seen examined bedside, lying in bed.  Slightly uncomfortable due to pain and would wish to use the toilet to try to go to the bathroom.  RN present at bedside.  Received bed offer and insurance authorization per social work today and will be discharging to SNF later this morning.  No other questions or concerns at this time.  Denies  headache, no dizziness, no chest pain, no shortness of breath, no abdominal pain, no fever/chills/night sweats, no nausea/vomiting/diarrhea, no focal weakness, no fatigue, no paresthesias.  RN reports some hallucinations overnight, concern for some mild cognitive impairment/mild dementia that is being brought forth by acute illness.  No other acute events overnight per nursing.  Discharge Exam: Vitals:   01/09/23 0524 01/09/23 1317  BP: 130/72 120/63  Pulse: 65 62  Resp: 18 17  Temp: 97.9 F (36.6 C) 97.8 F (36.6 C)  SpO2: 93% 99%   Vitals:   01/08/23 1327 01/09/23 0500 01/09/23 0524 01/09/23 1317  BP: (!) 146/60  130/72 120/63  Pulse: 62  65 62  Resp: 18  18 17   Temp: 97.8 F (36.6 C)  97.9 F (36.6 C) 97.8 F (36.6 C)  TempSrc:   Oral   SpO2: 100%  93% 99%  Weight:  84.4 kg    Height:        Physical Exam: GEN: NAD, alert, elderly in appearance HEENT: NCAT, PERRL, EOMI, sclera clear, MMM PULM: CTAB w/o wheezes/crackles, normal respiratory effort, on room air CV: RRR w/o M/G/R GI: abd soft, NTND, NABS MSK: no peripheral edema, moves all extremities independently, neurovascularly intact NEURO: CN II-XII intact, no focal deficits, sensation to light touch intact PSYCH: normal mood/affect Integumentary: dry/intact, no rashes or wounds    The results of significant diagnostics from this hospitalization (including imaging, microbiology, ancillary and laboratory) are listed below for reference.     Microbiology: No results found for this or any previous visit (from the past 240 hour(s)).   Labs: BNP (last 3 results) Recent Labs    10/28/22 1221 11/01/22 1533 12/31/22 1227  BNP 314.1* 224.1* 704.0*   Basic Metabolic Panel: Recent Labs  Lab 01/05/23 0332 01/06/23 0323 01/07/23 0321 01/08/23 0337 01/09/23 0327  NA 131* 129* 131*  133* 133*  K 4.6 4.2 3.6 3.8 4.5  CL 97* 93* 95* 98 99  CO2 23 25 24 24 24   GLUCOSE 101* 107* 104* 105* 108*  BUN 31* 37* 44*  37* 32*  CREATININE 1.39* 1.49* 1.47* 1.20* 1.14*  CALCIUM 8.9 9.1 9.3 9.2 9.5  MG 2.0 2.0  --  2.2  --    Liver Function Tests: No results for input(s): "AST", "ALT", "ALKPHOS", "BILITOT", "PROT", "ALBUMIN" in the last 168 hours. No results for input(s): "LIPASE", "AMYLASE" in the last 168 hours. No results for input(s): "AMMONIA" in the last 168 hours. CBC: Recent Labs  Lab 01/02/23 2148 01/05/23 0332 01/07/23 0321 01/08/23 0337  WBC 5.5 5.4 5.3 6.8  NEUTROABS 4.7 4.0 3.6 5.0  HGB 10.3* 10.7* 10.8* 11.0*  HCT 32.2* 33.5* 33.5* 34.1*  MCV 103.9* 105.0* 101.8* 102.1*  PLT 148* 172 247 268   Cardiac Enzymes: No results for input(s): "CKTOTAL", "CKMB", "CKMBINDEX", "TROPONINI" in the last 168 hours. BNP: Invalid input(s): "POCBNP" CBG: No results for input(s): "GLUCAP" in the last 168 hours. D-Dimer No results for input(s): "DDIMER" in the last 72 hours. Hgb A1c No results for input(s): "HGBA1C" in the last 72 hours. Lipid Profile No results for input(s): "CHOL", "HDL", "LDLCALC", "TRIG", "CHOLHDL", "LDLDIRECT" in the last 72 hours. Thyroid function studies No results for input(s): "TSH", "T4TOTAL", "T3FREE", "THYROIDAB" in the last 72 hours.  Invalid input(s): "FREET3" Anemia work up No results for input(s): "VITAMINB12", "FOLATE", "FERRITIN", "TIBC", "IRON", "RETICCTPCT" in the last 72 hours. Urinalysis    Component Value Date/Time   COLORURINE yellow 05/29/2010 0852   APPEARANCEUR Clear 05/29/2010 0852   LABSPEC 1.020 05/29/2010 0852   PHURINE 6.5 05/29/2010 0852   HGBUR negative 05/29/2010 0852   BILIRUBINUR n 10/14/2016 0926   PROTEINUR n 10/14/2016 0926   UROBILINOGEN 0.2 10/14/2016 0926   UROBILINOGEN 1.0 05/29/2010 0852   NITRITE n 10/14/2016 0926   NITRITE negative 05/29/2010 0852   LEUKOCYTESUR Negative 10/14/2016 0926   Sepsis Labs Recent Labs  Lab 01/02/23 2148 01/05/23 0332 01/07/23 0321 01/08/23 0337  WBC 5.5 5.4 5.3 6.8   Microbiology No  results found for this or any previous visit (from the past 240 hour(s)).   Time coordinating discharge: Over 30 minutes  SIGNED:   Alvira Philips [place], DO  [facility] 01/09/2023, 2:25 PM

## 2023-01-10 DIAGNOSIS — I5042 Chronic combined systolic (congestive) and diastolic (congestive) heart failure: Secondary | ICD-10-CM | POA: Diagnosis not present

## 2023-01-10 DIAGNOSIS — M48061 Spinal stenosis, lumbar region without neurogenic claudication: Secondary | ICD-10-CM | POA: Diagnosis not present

## 2023-01-10 DIAGNOSIS — S3210XA Unspecified fracture of sacrum, initial encounter for closed fracture: Secondary | ICD-10-CM | POA: Diagnosis not present

## 2023-01-10 DIAGNOSIS — F03918 Unspecified dementia, unspecified severity, with other behavioral disturbance: Secondary | ICD-10-CM | POA: Diagnosis not present

## 2023-01-13 DIAGNOSIS — I482 Chronic atrial fibrillation, unspecified: Secondary | ICD-10-CM | POA: Diagnosis not present

## 2023-01-13 DIAGNOSIS — G4700 Insomnia, unspecified: Secondary | ICD-10-CM | POA: Diagnosis not present

## 2023-01-13 DIAGNOSIS — F32A Depression, unspecified: Secondary | ICD-10-CM | POA: Diagnosis not present

## 2023-01-13 DIAGNOSIS — I5020 Unspecified systolic (congestive) heart failure: Secondary | ICD-10-CM | POA: Diagnosis not present

## 2023-01-13 DIAGNOSIS — I129 Hypertensive chronic kidney disease with stage 1 through stage 4 chronic kidney disease, or unspecified chronic kidney disease: Secondary | ICD-10-CM | POA: Diagnosis not present

## 2023-01-13 DIAGNOSIS — E785 Hyperlipidemia, unspecified: Secondary | ICD-10-CM | POA: Diagnosis not present

## 2023-01-13 DIAGNOSIS — M48061 Spinal stenosis, lumbar region without neurogenic claudication: Secondary | ICD-10-CM | POA: Diagnosis not present

## 2023-01-13 DIAGNOSIS — F909 Attention-deficit hyperactivity disorder, unspecified type: Secondary | ICD-10-CM | POA: Diagnosis not present

## 2023-01-13 DIAGNOSIS — F411 Generalized anxiety disorder: Secondary | ICD-10-CM | POA: Diagnosis not present

## 2023-01-15 DIAGNOSIS — S3210XD Unspecified fracture of sacrum, subsequent encounter for fracture with routine healing: Secondary | ICD-10-CM | POA: Diagnosis not present

## 2023-01-15 DIAGNOSIS — I129 Hypertensive chronic kidney disease with stage 1 through stage 4 chronic kidney disease, or unspecified chronic kidney disease: Secondary | ICD-10-CM | POA: Diagnosis not present

## 2023-01-15 DIAGNOSIS — I5020 Unspecified systolic (congestive) heart failure: Secondary | ICD-10-CM | POA: Diagnosis not present

## 2023-01-15 DIAGNOSIS — M48061 Spinal stenosis, lumbar region without neurogenic claudication: Secondary | ICD-10-CM | POA: Diagnosis not present

## 2023-01-15 DIAGNOSIS — G63 Polyneuropathy in diseases classified elsewhere: Secondary | ICD-10-CM | POA: Diagnosis not present

## 2023-01-15 DIAGNOSIS — F411 Generalized anxiety disorder: Secondary | ICD-10-CM | POA: Diagnosis not present

## 2023-01-16 ENCOUNTER — Ambulatory Visit: Payer: Medicare PPO | Admitting: Physician Assistant

## 2023-01-20 DIAGNOSIS — F411 Generalized anxiety disorder: Secondary | ICD-10-CM | POA: Diagnosis not present

## 2023-01-20 DIAGNOSIS — I482 Chronic atrial fibrillation, unspecified: Secondary | ICD-10-CM | POA: Diagnosis not present

## 2023-01-20 DIAGNOSIS — M48061 Spinal stenosis, lumbar region without neurogenic claudication: Secondary | ICD-10-CM | POA: Diagnosis not present

## 2023-01-20 DIAGNOSIS — I129 Hypertensive chronic kidney disease with stage 1 through stage 4 chronic kidney disease, or unspecified chronic kidney disease: Secondary | ICD-10-CM | POA: Diagnosis not present

## 2023-01-20 DIAGNOSIS — G63 Polyneuropathy in diseases classified elsewhere: Secondary | ICD-10-CM | POA: Diagnosis not present

## 2023-01-20 DIAGNOSIS — I5020 Unspecified systolic (congestive) heart failure: Secondary | ICD-10-CM | POA: Diagnosis not present

## 2023-01-21 ENCOUNTER — Encounter: Payer: Self-pay | Admitting: Internal Medicine

## 2023-01-22 NOTE — Telephone Encounter (Signed)
Pt calling to check on progress of this request.  

## 2023-01-25 DIAGNOSIS — N179 Acute kidney failure, unspecified: Secondary | ICD-10-CM | POA: Diagnosis not present

## 2023-01-25 DIAGNOSIS — I130 Hypertensive heart and chronic kidney disease with heart failure and stage 1 through stage 4 chronic kidney disease, or unspecified chronic kidney disease: Secondary | ICD-10-CM | POA: Diagnosis not present

## 2023-01-25 DIAGNOSIS — M4807 Spinal stenosis, lumbosacral region: Secondary | ICD-10-CM | POA: Diagnosis not present

## 2023-01-25 DIAGNOSIS — D631 Anemia in chronic kidney disease: Secondary | ICD-10-CM | POA: Diagnosis not present

## 2023-01-25 DIAGNOSIS — N1832 Chronic kidney disease, stage 3b: Secondary | ICD-10-CM | POA: Diagnosis not present

## 2023-01-25 DIAGNOSIS — W19XXXD Unspecified fall, subsequent encounter: Secondary | ICD-10-CM | POA: Diagnosis not present

## 2023-01-25 DIAGNOSIS — S3210XD Unspecified fracture of sacrum, subsequent encounter for fracture with routine healing: Secondary | ICD-10-CM | POA: Diagnosis not present

## 2023-01-25 DIAGNOSIS — M8580 Other specified disorders of bone density and structure, unspecified site: Secondary | ICD-10-CM | POA: Diagnosis not present

## 2023-01-25 DIAGNOSIS — I5042 Chronic combined systolic (congestive) and diastolic (congestive) heart failure: Secondary | ICD-10-CM | POA: Diagnosis not present

## 2023-01-26 ENCOUNTER — Emergency Department (HOSPITAL_COMMUNITY): Payer: Medicare PPO

## 2023-01-26 ENCOUNTER — Encounter (HOSPITAL_COMMUNITY): Payer: Self-pay

## 2023-01-26 ENCOUNTER — Other Ambulatory Visit: Payer: Self-pay

## 2023-01-26 ENCOUNTER — Inpatient Hospital Stay (HOSPITAL_COMMUNITY)
Admission: EM | Admit: 2023-01-26 | Discharge: 2023-01-30 | DRG: 918 | Disposition: A | Discharge status: Skilled Nursing Facility | Payer: Medicare PPO | Attending: Internal Medicine | Admitting: Internal Medicine

## 2023-01-26 DIAGNOSIS — M8448XD Pathological fracture, other site, subsequent encounter for fracture with routine healing: Secondary | ICD-10-CM | POA: Diagnosis present

## 2023-01-26 DIAGNOSIS — E669 Obesity, unspecified: Secondary | ICD-10-CM | POA: Diagnosis present

## 2023-01-26 DIAGNOSIS — M48061 Spinal stenosis, lumbar region without neurogenic claudication: Secondary | ICD-10-CM | POA: Diagnosis present

## 2023-01-26 DIAGNOSIS — Z7401 Bed confinement status: Secondary | ICD-10-CM | POA: Diagnosis not present

## 2023-01-26 DIAGNOSIS — Z79899 Other long term (current) drug therapy: Secondary | ICD-10-CM

## 2023-01-26 DIAGNOSIS — Z7901 Long term (current) use of anticoagulants: Secondary | ICD-10-CM

## 2023-01-26 DIAGNOSIS — G8911 Acute pain due to trauma: Secondary | ICD-10-CM | POA: Diagnosis not present

## 2023-01-26 DIAGNOSIS — R278 Other lack of coordination: Secondary | ICD-10-CM | POA: Diagnosis not present

## 2023-01-26 DIAGNOSIS — G629 Polyneuropathy, unspecified: Secondary | ICD-10-CM | POA: Diagnosis present

## 2023-01-26 DIAGNOSIS — I480 Paroxysmal atrial fibrillation: Secondary | ICD-10-CM | POA: Diagnosis present

## 2023-01-26 DIAGNOSIS — Z86010 Personal history of colonic polyps: Secondary | ICD-10-CM

## 2023-01-26 DIAGNOSIS — S32059A Unspecified fracture of fifth lumbar vertebra, initial encounter for closed fracture: Secondary | ICD-10-CM | POA: Diagnosis not present

## 2023-01-26 DIAGNOSIS — Z882 Allergy status to sulfonamides status: Secondary | ICD-10-CM

## 2023-01-26 DIAGNOSIS — Z96653 Presence of artificial knee joint, bilateral: Secondary | ICD-10-CM | POA: Diagnosis present

## 2023-01-26 DIAGNOSIS — D638 Anemia in other chronic diseases classified elsewhere: Secondary | ICD-10-CM | POA: Diagnosis present

## 2023-01-26 DIAGNOSIS — S3210XD Unspecified fracture of sacrum, subsequent encounter for fracture with routine healing: Secondary | ICD-10-CM | POA: Diagnosis not present

## 2023-01-26 DIAGNOSIS — Z9049 Acquired absence of other specified parts of digestive tract: Secondary | ICD-10-CM | POA: Diagnosis not present

## 2023-01-26 DIAGNOSIS — E538 Deficiency of other specified B group vitamins: Secondary | ICD-10-CM | POA: Diagnosis present

## 2023-01-26 DIAGNOSIS — W1830XD Fall on same level, unspecified, subsequent encounter: Secondary | ICD-10-CM | POA: Diagnosis not present

## 2023-01-26 DIAGNOSIS — T391X1A Poisoning by 4-Aminophenol derivatives, accidental (unintentional), initial encounter: Principal | ICD-10-CM | POA: Diagnosis present

## 2023-01-26 DIAGNOSIS — E876 Hypokalemia: Secondary | ICD-10-CM | POA: Diagnosis not present

## 2023-01-26 DIAGNOSIS — T887XXA Unspecified adverse effect of drug or medicament, initial encounter: Secondary | ICD-10-CM | POA: Diagnosis not present

## 2023-01-26 DIAGNOSIS — Z8249 Family history of ischemic heart disease and other diseases of the circulatory system: Secondary | ICD-10-CM | POA: Diagnosis not present

## 2023-01-26 DIAGNOSIS — I4892 Unspecified atrial flutter: Secondary | ICD-10-CM | POA: Diagnosis present

## 2023-01-26 DIAGNOSIS — Z6832 Body mass index (BMI) 32.0-32.9, adult: Secondary | ICD-10-CM

## 2023-01-26 DIAGNOSIS — F32A Depression, unspecified: Secondary | ICD-10-CM | POA: Diagnosis present

## 2023-01-26 DIAGNOSIS — R531 Weakness: Secondary | ICD-10-CM | POA: Diagnosis not present

## 2023-01-26 DIAGNOSIS — T391X4A Poisoning by 4-Aminophenol derivatives, undetermined, initial encounter: Secondary | ICD-10-CM

## 2023-01-26 DIAGNOSIS — Z833 Family history of diabetes mellitus: Secondary | ICD-10-CM

## 2023-01-26 DIAGNOSIS — E785 Hyperlipidemia, unspecified: Secondary | ICD-10-CM | POA: Diagnosis present

## 2023-01-26 DIAGNOSIS — F909 Attention-deficit hyperactivity disorder, unspecified type: Secondary | ICD-10-CM | POA: Diagnosis present

## 2023-01-26 DIAGNOSIS — Z8781 Personal history of (healed) traumatic fracture: Secondary | ICD-10-CM | POA: Diagnosis not present

## 2023-01-26 DIAGNOSIS — Z9884 Bariatric surgery status: Secondary | ICD-10-CM

## 2023-01-26 DIAGNOSIS — E871 Hypo-osmolality and hyponatremia: Secondary | ICD-10-CM | POA: Diagnosis present

## 2023-01-26 DIAGNOSIS — Z87891 Personal history of nicotine dependence: Secondary | ICD-10-CM

## 2023-01-26 DIAGNOSIS — M545 Low back pain, unspecified: Secondary | ICD-10-CM | POA: Diagnosis not present

## 2023-01-26 DIAGNOSIS — R7989 Other specified abnormal findings of blood chemistry: Secondary | ICD-10-CM | POA: Diagnosis not present

## 2023-01-26 DIAGNOSIS — M4316 Spondylolisthesis, lumbar region: Secondary | ICD-10-CM | POA: Diagnosis not present

## 2023-01-26 DIAGNOSIS — G479 Sleep disorder, unspecified: Secondary | ICD-10-CM

## 2023-01-26 DIAGNOSIS — D7589 Other specified diseases of blood and blood-forming organs: Secondary | ICD-10-CM | POA: Diagnosis present

## 2023-01-26 DIAGNOSIS — M4807 Spinal stenosis, lumbosacral region: Secondary | ICD-10-CM | POA: Diagnosis present

## 2023-01-26 DIAGNOSIS — M1712 Unilateral primary osteoarthritis, left knee: Secondary | ICD-10-CM | POA: Diagnosis present

## 2023-01-26 DIAGNOSIS — M6281 Muscle weakness (generalized): Secondary | ICD-10-CM | POA: Diagnosis not present

## 2023-01-26 DIAGNOSIS — F419 Anxiety disorder, unspecified: Secondary | ICD-10-CM | POA: Diagnosis present

## 2023-01-26 DIAGNOSIS — R7401 Elevation of levels of liver transaminase levels: Secondary | ICD-10-CM | POA: Diagnosis present

## 2023-01-26 DIAGNOSIS — Z905 Acquired absence of kidney: Secondary | ICD-10-CM

## 2023-01-26 DIAGNOSIS — I110 Hypertensive heart disease with heart failure: Secondary | ICD-10-CM | POA: Diagnosis present

## 2023-01-26 DIAGNOSIS — Z880 Allergy status to penicillin: Secondary | ICD-10-CM

## 2023-01-26 DIAGNOSIS — I5042 Chronic combined systolic (congestive) and diastolic (congestive) heart failure: Secondary | ICD-10-CM | POA: Diagnosis present

## 2023-01-26 DIAGNOSIS — S3210XA Unspecified fracture of sacrum, initial encounter for closed fracture: Secondary | ICD-10-CM | POA: Diagnosis not present

## 2023-01-26 DIAGNOSIS — Z825 Family history of asthma and other chronic lower respiratory diseases: Secondary | ICD-10-CM

## 2023-01-26 DIAGNOSIS — M6259 Muscle wasting and atrophy, not elsewhere classified, multiple sites: Secondary | ICD-10-CM | POA: Diagnosis not present

## 2023-01-26 LAB — CBC WITH DIFFERENTIAL/PLATELET
Abs Immature Granulocytes: 0.05 10*3/uL (ref 0.00–0.07)
Basophils Absolute: 0.1 10*3/uL (ref 0.0–0.1)
Basophils Relative: 1 %
Eosinophils Absolute: 0.2 10*3/uL (ref 0.0–0.5)
Eosinophils Relative: 4 %
HCT: 36.4 % (ref 36.0–46.0)
Hemoglobin: 11.5 g/dL — ABNORMAL LOW (ref 12.0–15.0)
Immature Granulocytes: 1 %
Lymphocytes Relative: 12 %
Lymphs Abs: 0.7 10*3/uL (ref 0.7–4.0)
MCH: 31.4 pg (ref 26.0–34.0)
MCHC: 31.6 g/dL (ref 30.0–36.0)
MCV: 99.5 fL (ref 80.0–100.0)
Monocytes Absolute: 0.5 10*3/uL (ref 0.1–1.0)
Monocytes Relative: 8 %
Neutro Abs: 4.3 10*3/uL (ref 1.7–7.7)
Neutrophils Relative %: 74 %
Platelets: 287 10*3/uL (ref 150–400)
RBC: 3.66 MIL/uL — ABNORMAL LOW (ref 3.87–5.11)
RDW: 12.1 % (ref 11.5–15.5)
WBC: 5.7 10*3/uL (ref 4.0–10.5)
nRBC: 0 % (ref 0.0–0.2)

## 2023-01-26 LAB — COMPREHENSIVE METABOLIC PANEL
ALT: 112 U/L — ABNORMAL HIGH (ref 0–44)
ALT: 109 U/L — ABNORMAL HIGH (ref 0–44)
AST: 133 U/L — ABNORMAL HIGH (ref 15–41)
AST: 83 U/L — ABNORMAL HIGH (ref 15–41)
Albumin: 3.6 g/dL (ref 3.5–5.0)
Albumin: 3.4 g/dL — ABNORMAL LOW (ref 3.5–5.0)
Alkaline Phosphatase: 380 U/L — ABNORMAL HIGH (ref 38–126)
Alkaline Phosphatase: 370 U/L — ABNORMAL HIGH (ref 38–126)
Anion gap: 10 (ref 5–15)
Anion gap: 14 (ref 5–15)
BUN: 17 mg/dL (ref 8–23)
BUN: 13 mg/dL (ref 8–23)
CO2: 18 mmol/L — ABNORMAL LOW (ref 22–32)
CO2: 17 mmol/L — ABNORMAL LOW (ref 22–32)
Calcium: 9.2 mg/dL (ref 8.9–10.3)
Calcium: 9.2 mg/dL (ref 8.9–10.3)
Chloride: 99 mmol/L (ref 98–111)
Chloride: 97 mmol/L — ABNORMAL LOW (ref 98–111)
Creatinine, Ser: 0.98 mg/dL (ref 0.44–1.00)
Creatinine, Ser: 0.77 mg/dL (ref 0.44–1.00)
GFR, Estimated: >60 mL/min (ref >60)
GFR, Estimated: >60 mL/min (ref >60)
Glucose, Bld: 99 mg/dL (ref 70–99)
Glucose, Bld: 100 mg/dL — ABNORMAL HIGH (ref 70–99)
Potassium: 3.7 mmol/L (ref 3.5–5.1)
Potassium: 3.3 mmol/L — ABNORMAL LOW (ref 3.5–5.1)
Sodium: 127 mmol/L — ABNORMAL LOW (ref 135–145)
Sodium: 128 mmol/L — ABNORMAL LOW (ref 135–145)
Total Bilirubin: 0.6 mg/dL (ref 0.3–1.2)
Total Bilirubin: 0.7 mg/dL (ref 0.3–1.2)
Total Protein: 6.6 g/dL (ref 6.5–8.1)
Total Protein: 6.6 g/dL (ref 6.5–8.1)

## 2023-01-26 LAB — CK: Total CK: 57 U/L (ref 38–234)

## 2023-01-26 LAB — ACETAMINOPHEN LEVEL
Acetaminophen (Tylenol), Serum: 12 ug/mL (ref 10–30)
Acetaminophen (Tylenol), Serum: <10 ug/mL — ABNORMAL LOW (ref 10–30)

## 2023-01-26 LAB — PROTIME-INR
INR: 1.4 — ABNORMAL HIGH (ref 0.8–1.2)
Prothrombin Time: 17.4 seconds — ABNORMAL HIGH (ref 11.4–15.2)

## 2023-01-26 MED ORDER — HYDROCODONE-ACETAMINOPHEN 5-325 MG PO TABS
2.0000 | ORAL_TABLET | Freq: Once | ORAL | Status: AC
  Administered 2023-01-26: 2 via ORAL
  Filled 2023-01-26: qty 2

## 2023-01-26 MED ORDER — METHOCARBAMOL 500 MG PO TABS
500.0000 mg | ORAL_TABLET | Freq: Four times a day (QID) | ORAL | Status: DC | PRN
  Administered 2023-01-26 – 2023-01-30 (×8): 500 mg via ORAL
  Filled 2023-01-26 (×8): qty 1

## 2023-01-26 MED ORDER — ACETYLCYSTEINE LOAD VIA INFUSION
150.0000 mg/kg | Freq: Once | INTRAVENOUS | Status: AC
  Administered 2023-01-26: 12795 mg via INTRAVENOUS
  Filled 2023-01-26: qty 420

## 2023-01-26 MED ORDER — DAPAGLIFLOZIN PROPANEDIOL 10 MG PO TABS
10.0000 mg | ORAL_TABLET | Freq: Every day | ORAL | Status: DC
  Administered 2023-01-26 – 2023-01-30 (×5): 10 mg via ORAL
  Filled 2023-01-26 (×6): qty 1

## 2023-01-26 MED ORDER — ALPRAZOLAM 0.25 MG PO TABS
0.2500 mg | ORAL_TABLET | Freq: Every evening | ORAL | Status: DC | PRN
  Administered 2023-01-26 – 2023-01-27 (×2): 0.25 mg via ORAL
  Filled 2023-01-26 (×2): qty 1

## 2023-01-26 MED ORDER — ATORVASTATIN CALCIUM 40 MG PO TABS
40.0000 mg | ORAL_TABLET | Freq: Every day | ORAL | Status: DC
  Administered 2023-01-26: 40 mg via ORAL
  Filled 2023-01-26: qty 1

## 2023-01-26 MED ORDER — ONDANSETRON HCL 4 MG PO TABS: 4.0000 mg | ORAL_TABLET | Freq: Four times a day (QID) | ORAL | Status: DC | PRN

## 2023-01-26 MED ORDER — LACTATED RINGERS IV BOLUS
1000.0000 mL | Freq: Once | INTRAVENOUS | Status: AC
  Administered 2023-01-26: 1000 mL via INTRAVENOUS

## 2023-01-26 MED ORDER — FENTANYL CITRATE PF 50 MCG/ML IJ SOSY
50.0000 ug | PREFILLED_SYRINGE | Freq: Once | INTRAMUSCULAR | Status: AC
  Administered 2023-01-26: 50 ug via INTRAVENOUS
  Filled 2023-01-26: qty 1

## 2023-01-26 MED ORDER — HYDROMORPHONE HCL 1 MG/ML IJ SOLN
0.5000 mg | Freq: Once | INTRAMUSCULAR | Status: AC
  Administered 2023-01-26: 0.5 mg via INTRAVENOUS
  Filled 2023-01-26: qty 1

## 2023-01-26 MED ORDER — LOSARTAN POTASSIUM 25 MG PO TABS
12.5000 mg | ORAL_TABLET | Freq: Every day | ORAL | Status: DC
  Administered 2023-01-26 – 2023-01-30 (×5): 12.5 mg via ORAL
  Filled 2023-01-26 (×2): qty 0.5
  Filled 2023-01-26 (×3): qty 1

## 2023-01-26 MED ORDER — FLUOXETINE HCL 20 MG PO CAPS
40.0000 mg | ORAL_CAPSULE | Freq: Every morning | ORAL | Status: DC
  Administered 2023-01-26 – 2023-01-30 (×5): 40 mg via ORAL
  Filled 2023-01-26 (×5): qty 2

## 2023-01-26 MED ORDER — OXYCODONE HCL 5 MG PO TABS
5.0000 mg | ORAL_TABLET | ORAL | Status: DC | PRN
  Administered 2023-01-26 – 2023-01-27 (×4): 5 mg via ORAL
  Filled 2023-01-26 (×4): qty 1

## 2023-01-26 MED ORDER — ALBUTEROL SULFATE (2.5 MG/3ML) 0.083% IN NEBU: 2.5000 mg | INHALATION_SOLUTION | RESPIRATORY_TRACT | Status: DC | PRN

## 2023-01-26 MED ORDER — DEXTROSE 5 % IV SOLN
15.0000 mg/kg/h | INTRAVENOUS | Status: DC
  Administered 2023-01-26: 15 mg/kg/h via INTRAVENOUS
  Filled 2023-01-26 (×2): qty 90

## 2023-01-26 MED ORDER — GABAPENTIN 100 MG PO CAPS
100.0000 mg | ORAL_CAPSULE | Freq: Two times a day (BID) | ORAL | Status: DC
  Administered 2023-01-26 – 2023-01-27 (×3): 100 mg via ORAL
  Filled 2023-01-26 (×3): qty 1

## 2023-01-26 MED ORDER — MELATONIN 3 MG PO TABS
3.0000 mg | ORAL_TABLET | Freq: Every day | ORAL | Status: DC
  Administered 2023-01-26 – 2023-01-29 (×4): 3 mg via ORAL
  Filled 2023-01-26 (×4): qty 1

## 2023-01-26 MED ORDER — ONDANSETRON HCL 4 MG/2ML IJ SOLN: 4.0000 mg | Freq: Four times a day (QID) | INTRAMUSCULAR | Status: DC | PRN

## 2023-01-26 MED ORDER — AMIODARONE HCL 200 MG PO TABS
200.0000 mg | ORAL_TABLET | Freq: Every day | ORAL | Status: DC
  Administered 2023-01-26 – 2023-01-30 (×5): 200 mg via ORAL
  Filled 2023-01-26 (×5): qty 1

## 2023-01-26 MED ORDER — SENNOSIDES-DOCUSATE SODIUM 8.6-50 MG PO TABS: 1.0000 | ORAL_TABLET | Freq: Every evening | ORAL | Status: DC | PRN

## 2023-01-26 MED ORDER — FENTANYL 25 MCG/HR TD PT72
1.0000 | MEDICATED_PATCH | TRANSDERMAL | Status: DC
  Administered 2023-01-26 – 2023-01-29 (×2): 1 via TRANSDERMAL
  Filled 2023-01-26 (×2): qty 1

## 2023-01-26 MED ORDER — METOPROLOL SUCCINATE ER 50 MG PO TB24
50.0000 mg | ORAL_TABLET | Freq: Every day | ORAL | Status: DC
  Administered 2023-01-26 – 2023-01-30 (×5): 50 mg via ORAL
  Filled 2023-01-26 (×5): qty 1

## 2023-01-26 MED ORDER — LAMOTRIGINE 100 MG PO TABS
100.0000 mg | ORAL_TABLET | Freq: Two times a day (BID) | ORAL | Status: DC
  Administered 2023-01-26 – 2023-01-30 (×9): 100 mg via ORAL
  Filled 2023-01-26 (×9): qty 1

## 2023-01-26 MED ORDER — SODIUM CHLORIDE 0.9 % IV SOLN: INTRAVENOUS | Status: DC

## 2023-01-26 MED ORDER — LORAZEPAM 1 MG PO TABS
1.0000 mg | ORAL_TABLET | ORAL | Status: DC | PRN
  Administered 2023-01-26 – 2023-01-30 (×8): 1 mg via ORAL
  Filled 2023-01-26 (×8): qty 1

## 2023-01-26 MED ORDER — APIXABAN 5 MG PO TABS
5.0000 mg | ORAL_TABLET | Freq: Two times a day (BID) | ORAL | Status: DC
  Administered 2023-01-26 – 2023-01-30 (×9): 5 mg via ORAL
  Filled 2023-01-26 (×9): qty 1

## 2023-01-26 NOTE — H&P (Addendum)
History and Physical  Leslie [name] ZOX:[id number] DOB: [birth year] DOA: 01/26/2023  PCP: Philip Aspen, Limmie Patricia, MD   Chief Complaint: Back pain  HPI: Leslie [name] is a 72 y.o. female with medical history significant for hypertension, atrial fibrillation on Eliquis, recent sacral fracture with sacroplasty on 6/18 who was discharged to rehab on 6/20 and now returns from home with severe uncontrolled pain and found to have elevated LFTs concerning for subacute/chronic unintentional Tylenol overdose.  History is provided by review of the medical record, discussion with the patient's bedside nurse, and with the patient.  It seems that the patient was discharged from the hospital to [facility] on 6/20 with prescriptions for Norco and fentanyl patch.  According to the patient and her daughter, she never had a fentanyl patch placed and was not sent home with fentanyl patch prescription after her stay at [facility].  They state that she did receive Norco while at the facility, but the patient says she went home with only a few Norco tablets and was told that she was given a paper prescription for pain medication at the time of discharge from [facility].  However, patient states family could not locate this prescription.  Apparently daughter states patient was taking about 1000 mg of Tylenol every 4-6 hours over the last few days.  ED Course: On arrival to the ER, patient is hypertensive but otherwise hemodynamically stable with normal vital signs.  Lab work significant for AST and ALT 133/112, alk phos 380, normal renal function, sodium 127.  Poison Control Center was contacted, they recommend 12-hour Mucomyst infusion which has been started at around 7 AM.  Review of Systems: Please see HPI for pertinent positives and negatives. A complete 10 system review of systems are otherwise negative.  Past Medical History:  Diagnosis Date   ADD (attention deficit disorder)    Allergy    Anemia     Arthritis 07/22/2014   osteoarthritis left knee   Colon polyps    Hypertension    Left knee pain    chronic   Narcotic addiction (HCC)    Obesity    post gastric bypass   Vitamin D deficiency    Past Surgical History:  Procedure Laterality Date   arthscopic knee surgery Left    several times   BREAST BIOPSY     BREAST SURGERY  years ago   breast biopsy   CHOLECYSTECTOMY     EYE SURGERY Bilateral 2013   Toric Lens implants   EYE SURGERY Right 2014   corneal amniotic membrane   GASTRIC BYPASS  2003   IR SACROPLASTY BILATERAL  01/07/2023   KIDNEY DONATION  2004   TOTAL KNEE ARTHROPLASTY Left 06/16/2015   Procedure: LEFT TOTAL KNEE ARTHROPLASTY;  Surgeon: Kathryne Hitch, MD;  Location: [facility];  Service: Orthopedics;  Laterality: Left;   TOTAL KNEE ARTHROPLASTY Right 05/04/2021   Procedure: RIGHT TOTAL KNEE ARTHROPLASTY;  Surgeon: Kathryne Hitch, MD;  Location: [facility];  Service: Orthopedics;  Laterality: Right;    Social History:  reports that she quit smoking about 30 years ago. Her smoking use included cigarettes. She has never used smokeless tobacco. She reports current alcohol use. She reports that she does not use drugs.   Allergies  Allergen Reactions   Amoxicillin     Hives   Sulfamethoxazole Nausea And Vomiting    See patient list of med intolerances due to h/o gastric bypass and nephrectomy    Family History  Problem Relation  Age of Onset   Heart disease Mother        post CABG history of CHF   COPD Father    Diabetes Sister    Hypertension Sister        post renal transplant   Heart disease Brother        CAD     Prior to Admission medications   Medication Sig Start Date End Date Taking? Authorizing Provider  acetaminophen (TYLENOL) 500 MG tablet Take 1,000 mg by mouth every 6 (six) hours as needed for moderate pain or mild pain.    [provider]  ALPRAZolam Prudy Feeler) 0.25 MG tablet Take 1 tablet (0.25 mg total) by mouth at  bedtime as needed for anxiety. TAKE 1 TABLET(0.25 MG) BY MOUTH AT BEDTIME AS NEEDED FOR ANXIETY Strength: 0.25 mg 01/09/23   [place], Alvira Philips, DO  amiodarone (PACERONE) 200 MG tablet Take 200 mg by mouth daily.    [provider]  amphetamine-dextroamphetamine (ADDERALL XR) 30 MG 24 hr capsule Take 1 capsule (30 mg total) by mouth every morning. 01/09/23   [place], Alvira Philips, DO  amphetamine-dextroamphetamine (ADDERALL) 20 MG tablet Take 1 tablet (20 mg total) by mouth daily. 01/09/23   [place], Alvira Philips, DO  apixaban (ELIQUIS) 5 MG TABS tablet Take 1 tablet (5 mg total) by mouth 2 (two) times daily. 09/09/22   Pricilla Riffle, MD  atorvastatin (LIPITOR) 40 MG tablet Take 1 tablet (40 mg total) by mouth daily. 12/17/22   Pricilla Riffle, MD  dapagliflozin propanediol (FARXIGA) 10 MG TABS tablet Take 1 tablet (10 mg total) by mouth daily before breakfast. 12/31/22   Sabharwal, Aditya, DO  fentaNYL (DURAGESIC) 25 MCG/HR Place 1 patch onto the skin every 3 (three) days. 01/09/23   [place], Eric J, DO  FLUoxetine (PROZAC) 40 MG capsule TAKE 1 CAPSULE(40 MG) BY MOUTH EVERY MORNING Patient taking differently: Take 40 mg by mouth in the morning. 12/26/22   Philip Aspen, Limmie Patricia, MD  furosemide (LASIX) 40 MG tablet Take 20 mg by mouth as needed. Take one half (0.5) tablet by mouth ( 20 mg) as needed for wt gain of 3 lbs in 24 hours or 5 lbs in one week.    [provider]  gabapentin (NEURONTIN) 100 MG capsule Take 100 mg by mouth 2 (two) times daily. 01/15/23   [provider]  HYDROcodone-acetaminophen (NORCO/VICODIN) 5-325 MG tablet Take 1-2 tablets by mouth every 4 (four) hours as needed for moderate pain or severe pain. 01/09/23   [place], Alvira Philips, DO  lamoTRIgine (LAMICTAL) 100 MG tablet TAKE 1 TABLET(100 MG) BY MOUTH TWICE DAILY Patient taking differently: Take 100 mg by mouth 2 (two) times daily. 11/13/22   Philip Aspen, Limmie Patricia, MD  lidocaine (LIDODERM) 5 % Place 2 patches onto the skin  daily. Remove & Discard patch within 12 hours or as directed by MD 01/09/23   [place], Alvira Philips, DO  losartan (COZAAR) 25 MG tablet Take 0.5 tablets (12.5 mg total) by mouth daily. 11/15/22 02/13/23  Sabharwal, Aditya, DO  melatonin 3 MG TABS tablet Take 1 tablet (3 mg total) by mouth at bedtime. 01/09/23   [place], Alvira Philips, DO  methocarbamol (ROBAXIN) 500 MG tablet Take 1 tablet (500 mg total) by mouth every 6 (six) hours as needed for muscle spasms. 01/09/23   [place], Alvira Philips, DO  metoprolol succinate (TOPROL-XL) 50 MG 24 hr tablet Take 1 tablet (50 mg total) by mouth at bedtime. Take with  or immediately following a meal. 11/15/22 02/13/23  Sabharwal, Aditya, DO  Multiple Vitamins-Minerals (PRESERVISION AREDS 2) CAPS Take 1 capsule by mouth 2 (two) times daily.    [provider]  polyethylene glycol (MIRALAX / GLYCOLAX) 17 g packet Take 17 g by mouth daily as needed. 01/09/23   [place], Alvira Philips, DO  potassium chloride (KLOR-CON M) 10 MEQ tablet Take 10 mEq by mouth daily. 01/09/23   [provider]  potassium chloride (KLOR-CON) 10 MEQ tablet Take 10 mEq by mouth daily. 11/09/22   [provider]  senna-docusate (SENOKOT-S) 8.6-50 MG tablet Take 2 tablets by mouth 2 (two) times daily. 01/09/23   [place], Alvira Philips, DO  spironolactone (ALDACTONE) 25 MG tablet Take 0.5 tablets (12.5 mg total) by mouth daily. 10/30/22   Robbie Lis M, PA-C  Vitamin D, Ergocalciferol, (DRISDOL) 1.25 MG (50000 UNIT) CAPS capsule Take 1 capsule (50,000 Units total) by mouth every 7 (seven) days. 01/10/23   [place], Alvira Philips, DO  zolpidem (AMBIEN) 10 MG tablet Take 1 tablet (10 mg total) by mouth at bedtime. TAKE 1 TABLET(10 MG) BY MOUTH AT BEDTIME 01/09/23 02/08/23  [place], Eric J, DO    Physical Exam: BP (!) 153/95   Pulse 69   Temp 97.9 F (36.6 C) (Oral)   Resp 17   Ht 5\' 4"  (1.626 m)   Wt 85.3 kg   SpO2 96%   BMI 32.27 kg/m   General: Alert, oriented x 4, in moderate distress from hip and leg  pain. Eyes: EOMI, clear conjuctivae, white sclerea Neck: supple, no masses, trachea mildline  Cardiovascular: RRR, no murmurs or rubs, no peripheral edema  Respiratory: clear to auscultation bilaterally, no wheezes, no crackles  Abdomen: soft, nontender, nondistended, normal bowel tones heard  Skin: dry, no rashes  Musculoskeletal: no joint effusions, limited range of motion due to lower extremity and hip pain Psychiatric: appropriate affect, normal speech  Neurologic: extraocular muscles intact, clear speech, moving all extremities with intact sensorium          Labs on Admission:  Basic Metabolic Panel: Recent Labs  Lab 01/26/23 0323  NA 127*  K 3.7  CL 99  CO2 18*  GLUCOSE 99  BUN 17  CREATININE 0.98  CALCIUM 9.2   Liver Function Tests: Recent Labs  Lab 01/26/23 0323  AST 133*  ALT 112*  ALKPHOS 380*  BILITOT 0.6  PROT 6.6  ALBUMIN 3.6   No results for input(s): "LIPASE", "AMYLASE" in the last 168 hours. No results for input(s): "AMMONIA" in the last 168 hours. CBC: Recent Labs  Lab 01/26/23 0323  WBC 5.7  NEUTROABS 4.3  HGB 11.5*  HCT 36.4  MCV 99.5  PLT 287   Cardiac Enzymes: Recent Labs  Lab 01/26/23 0323  CKTOTAL 57    BNP (last 3 results) Recent Labs    10/28/22 1221 11/01/22 1533 12/31/22 1227  BNP 314.1* 224.1* 704.0*    ProBNP (last 3 results) Recent Labs    08/22/22 1125 08/30/22 1335 09/12/22 1437  PROBNP 6,931* 3,625* 1,775*    CBG: No results for input(s): "GLUCAP" in the last 168 hours.  Radiological Exams on Admission: US Abdomen Limited RUQ (LIVER/GB)  Result Date: 01/26/2023 CLINICAL DATA:  Elevated LFTs. EXAM: ULTRASOUND ABDOMEN LIMITED RIGHT UPPER QUADRANT COMPARISON:  None Available. FINDINGS: Gallbladder: Status post cholecystectomy. Common bile duct: Diameter: 2.7 mm Liver: No focal lesion identified. Within normal limits in parenchymal echogenicity. Portal vein is patent on color Doppler imaging with normal  direction of blood flow towards the liver. Other: Diminished exam detail due to body habitus, overlying bowel gas and patient motion. IMPRESSION: 1. Status post cholecystectomy.  No bile duct dilatation. 2. No acute abnormality. Electronically Signed   By: Signa Kell M.D.   On: 01/26/2023 05:52   CT PELVIS WO CONTRAST  Result Date: 01/26/2023 CLINICAL DATA:  Hip trauma with fracture suspected. Recent sacral fracture peer EXAM: CT PELVIS WITHOUT CONTRAST TECHNIQUE: Multidetector CT imaging of the pelvis was performed following the standard protocol without intravenous contrast. RADIATION DOSE REDUCTION: This exam was performed according to the departmental dose-optimization program which includes automated exposure control, adjustment of the mA and/or kV according to patient size and/or use of iterative reconstruction technique. COMPARISON:  Lumbar CT 01/04/2023 FINDINGS: Subacute sacral insufficiency fractures involving both ala and transversely across the S1-2 level. Anterior displacement at the S1-2 fracture plane measures 11 mm, increased from before when there was only slight cortical offset. There is fracture reabsorption along the bilateral ala components which have undergone interval cement augmentation. Bilateral L5 transverse process fractures. No obturator ring fractures. Both hips appear intact and located. Expected presacral edema. Dystrophic calcification in fat along the bilateral ischial tuberosities attributed to pressure IMPRESSION: 1. Subacute sacral insufficiency fractures with progressive displacement of the horizontal component at S1-2 when compared to 01/02/2023. Interval sacroplasty on both sides. 2. L5 bilateral transverse process fractures, interval on the right since 01/02/2023. Electronically Signed   By: Tiburcio Pea M.D.   On: 01/26/2023 04:26   CT Lumbar Spine Wo Contrast  Result Date: 01/26/2023 CLINICAL DATA:  Back trauma.  Recent sacral fracture. EXAM: CT LUMBAR SPINE  WITHOUT CONTRAST TECHNIQUE: Multidetector CT imaging of the lumbar spine was performed without intravenous contrast administration. Multiplanar CT image reconstructions were also generated. RADIATION DOSE REDUCTION: This exam was performed according to the departmental dose-optimization program which includes automated exposure control, adjustment of the mA and/or kV according to patient size and/or use of iterative reconstruction technique. COMPARISON:  01/02/2023 lumbar MRI FINDINGS: Segmentation: 5 lumbar type vertebrae. Alignment: Mild degenerative anterolisthesis at L4-5 Vertebrae: Pronounced osteopenia. There are bilateral transverse process fractures at L5, interval on the right when compared to prior. No acute vertebral body fracture. Progressive sacral insufficiency fracture described on dedicated pelvis CT. Paraspinal and other soft tissues: Presacral edema related to the known fracture. Atherosclerosis and left nephrectomy. Disc levels: Lumbar spine degeneration especially affects facets at L3-4 and below with the L4-5 anterolisthesis described above. Spinal stenosis at L3-4 and L4-5, especially advanced at L4-5. The foramina appear patent. IMPRESSION: 1. L5 subacute bilateral transverse process fracture, new on the right since comparison 01/02/2023. 2. Sacral insufficiency fracture described on dedicated pelvis CT reported separately. 3. Lumbar degeneration especially affecting facets with L4-5 anterolisthesis. Compressive spinal stenosis at L3-4 and especially L4-5. Electronically Signed   By: Tiburcio Pea M.D.   On: 01/26/2023 04:20    Assessment/Plan This is an unfortunate 72 year old Caucasian female with a history of hypertension, atrial fibrillation on Eliquis, recent mechanical fall resulting in sacral fracture and sacroplasty on 6/18 who was discharged to rehabilitation and then home, and returns with uncontrolled pain and found to have abnormal LFTs concerning for subacute/chronic Tylenol  overdose.  Abnormal LFTs-possibly due to subacute/chronic Tylenol overdose as the patient states she did not have adequate narcotic medications at the time of discharge. -Observation admission -Continue Mucomyst infusion -Recheck Tylenol level and LFTs after 12-hour infusion -Avoid hepatotoxins  Sacral fracture-note repeat imaging this morning -ER  provider discussed with neurosurgery on-call, who recommends pain control and weightbearing as tolerated no occasion for other intervention -Start fentanyl patch -Oxycodone 5 mg p.o. every 3 hours as needed -Will give Dilaudid IV 1 mg x 1 now for severe pain -PT consult, patient mentions that she has 24-hour caregivers at home as well as home health arranged through her PCP  Atrial fibrillation-continue amiodarone, Toprol-XL and Eliquis  Hypertension-continue home Cozaar  Hyperlipidemia-Lipitor 40 mg p.o. daily  Depression-Prozac  DVT prophylaxis: Eliquis as above    Code Status: Full Code  Consults called: None  Admission status: Observation  Time spent: 59 minutes  Caree Wolpert Sharlette Dense MD [facility] Pager [phone]  If 7PM-7AM, please contact night-coverage [email or web address] Password [facility]  01/26/2023, 8:23 AM

## 2023-01-26 NOTE — ED Notes (Signed)
Spoke with pts daughter brooke regarding pt. She states pt is taking 1000mg  of tylenol every 4-6 hrs at home. States pt was sent home with 24 of 30 norco tablets from [facility]. States when d/c from the hospital to facility paper prescriptions were supposed to be sent to facility with pt, unsure of exact but states she knows one was a fentanyl patch prescription that facility never received. Family updated of admission to hospital due to elevated LFTs

## 2023-01-26 NOTE — Progress Notes (Signed)
MEDICATION RELATED CONSULT NOTE   Pharmacy Consult for N-acetylcysteine  Indication: acetaminophen overdose  Allergies  Allergen Reactions   Amoxicillin     Hives   Sulfamethoxazole Nausea And Vomiting    See patient list of med intolerances due to h/o gastric bypass and nephrectomy    Patient Measurements: Height: 5\' 4"  (162.6 cm) Weight: 85.3 kg (188 lb) IBW/kg (Calculated) : 54.7 Adjusted Body Weight:   Vital Signs: Temp: 97.4 F (36.3 C) (07/07 0625) Temp Source: Oral (07/07 0625) BP: 139/100 (07/07 0615) Pulse Rate: 65 (07/07 0615) Intake/Output from previous day: No intake/output data recorded. Intake/Output from this shift: No intake/output data recorded.  Labs: Recent Labs    01/26/23 0323  WBC 5.7  HGB 11.5*  HCT 36.4  PLT 287  CREATININE 0.98  ALBUMIN 3.6  PROT 6.6  AST 133*  ALT 112*  ALKPHOS 380*  BILITOT 0.6   Estimated Creatinine Clearance: 54.8 mL/min (by C-G formula based on SCr of 0.98 mg/dL).   Microbiology: No results found for this or any previous visit (from the past 720 hour(s)).  Medical History: Past Medical History:  Diagnosis Date   ADD (attention deficit disorder)    Allergy    Anemia    Arthritis 07/22/2014   osteoarthritis left knee   Colon polyps    Hypertension    Left knee pain    chronic   Narcotic addiction (HCC)    Obesity    post gastric bypass   Vitamin D deficiency     Medications:    Assessment: Pharmacy is consulted to start NAC drip in 72 yo female with possible APAP overdose from over use of acetaminophen and Norco post-op.   Initial Labs - APAP level is 12 - LFTs elevated - AST 133, ALT 112 - Called and discussed with [facility] - Start NAC drip and repeat acetaminophen and LFTs 12 hours after infusion starts. Recommended regular infusion of 150 mg/kg load followed by  NAC 15 mg/kg/hr      Plan:  Start NAC 150 mg/kg bolus followed by  NAC 15 mg/kg/hr  Obtain acetaminophen, LFT 12 hours after  start of infusion    Adalberto Cole, PharmD, BCPS 01/26/2023 8:23 AM

## 2023-01-26 NOTE — Progress Notes (Signed)
MEDICATION RELATED CONSULT NOTE   Pharmacy Consult for N-acetylcysteine  Indication: acetaminophen overdose  Allergies  Allergen Reactions   Amoxicillin     Hives   Sulfamethoxazole Nausea And Vomiting    See patient list of med intolerances due to h/o gastric bypass and nephrectomy    Patient Measurements: Height: 5\' 4"  (162.6 cm) Weight: 85.3 kg (188 lb) IBW/kg (Calculated) : 54.7 Adjusted Body Weight:   Vital Signs: Temp: 98.1 F (36.7 C) (07/07 1555) Temp Source: Oral (07/07 1555) BP: 148/56 (07/07 1555) Pulse Rate: 66 (07/07 1555) Intake/Output from previous day: 07/06 0701 - 07/07 0700 In: 1000 [IV Piggyback:1000] Out: -  Intake/Output from this shift: No intake/output data recorded.  Labs: Recent Labs    01/26/23 0323 01/26/23 1928  WBC 5.7  --   HGB 11.5*  --   HCT 36.4  --   PLT 287  --   CREATININE 0.98 0.77  ALBUMIN 3.6 3.4*  PROT 6.6 6.6  AST 133* 83*  ALT 112* 109*  ALKPHOS 380* 370*  BILITOT 0.6 0.7    Estimated Creatinine Clearance: 67.1 mL/min (by C-G formula based on SCr of 0.77 mg/dL).   Microbiology: No results found for this or any previous visit (from the past 720 hour(s)).  Medical History: Past Medical History:  Diagnosis Date   ADD (attention deficit disorder)    Allergy    Anemia    Arthritis 07/22/2014   osteoarthritis left knee   Colon polyps    Hypertension    Left knee pain    chronic   Narcotic addiction (HCC)    Obesity    post gastric bypass   Vitamin D deficiency     Medications:    Assessment: Pharmacy is consulted to start NAC drip in 72 yo female with possible APAP overdose from over use of acetaminophen and Norco post-op.   Initial Labs - APAP level is 12 - LFTs elevated - AST 133, ALT 112 - Called and discussed with [facility] - Start NAC drip and repeat acetaminophen and LFTs 12 hours after infusion starts. Recommended regular infusion of 150 mg/kg load followed by  NAC 15 mg/kg/hr    Labs at 1928  called to the poison center APAP level < 10 AST 83, ALT 109, Alk phos 370,   Plan:  DC NAC Per [place] [facility] case is closed Continue supportive care & counsel pt to avoid acetaminophen products.  Herby Abraham, Pharm.D Use secure chat for questions 01/26/2023 8:46 PM

## 2023-01-26 NOTE — ED Triage Notes (Signed)
Pt states that she had surgery recently for sacral fracture and went to rehab and was discharged home on 01/21/23. Pt states that she is out of her Norco and continues to have back pain.

## 2023-01-26 NOTE — Evaluation (Signed)
Physical Therapy Evaluation Patient Details Name: Leslie [name] MRN: [id number] DOB: 09-26-1950 Today's Date: 01/26/2023  History of Present Illness  72 y.o. female with medical history significant for hypertension, atrial fibrillation on Eliquis, obesity s/p gastric bypass, s/p nephrectomy, bil TKAs, recent sacral fracture with sacroplasty on 6/18 who was discharged to rehab on 6/20 and now returns from home with severe uncontrolled pain and found to have elevated LFTs concerning for subacute/chronic unintentional Tylenol overdose.  Clinical Impression  Pt admitted with above diagnosis.  Pt currently with functional limitations due to the deficits listed below (see PT Problem List). Pt will benefit from acute skilled PT to increase their independence and safety with mobility to allow discharge.  Pt restless in bed from pain on arrival and agreeable to try positioning OOB in recliner.  Daughter present and encouraging.  Pt with increased pain in sacral area and back throughout session.  Attempted to reposition pt to comfort with pillows once in recliner.  Multiple RNs in room working on IV line during session.  Pt with recent admission and s/p sacroplasty on 6/18.  Pt discharged to SNF for rehab and recently home.  Daughter reports pt has only been transferring to/from wheelchair, has not ambulated.         Assistance Recommended at Discharge Intermittent Supervision/Assistance  If plan is discharge home, recommend the following:  Can travel by private vehicle  Assistance with cooking/housework;Direct supervision/assist for medications management;Assist for transportation;Help with stairs or ramp for entrance;A lot of help with walking and/or transfers;A lot of help with bathing/dressing/bathroom        Equipment Recommendations None recommended by PT  Recommendations for Other Services       Functional Status Assessment Patient has had a recent decline in their functional status and  demonstrates the ability to make significant improvements in function in a reasonable and predictable amount of time.     Precautions / Restrictions Precautions Precautions: Fall;Back Precaution Comments: back for comfort Restrictions Weight Bearing Restrictions: No      Mobility  Bed Mobility Overal bed mobility: Needs Assistance Bed Mobility: Rolling, Sidelying to Sit Rolling: Min guard Sidelying to sit: Mod assist       General bed mobility comments: multi-modal cues for log roll techinique, transistion to sitting completed by pt pulling to sit on PT's hand    Transfers Overall transfer level: Needs assistance Equipment used: None Transfers: Bed to chair/wheelchair/BSC Sit to Stand: Min assist           General transfer comment: recliner placed at 45* angle to bed as pt was performing transfer this way prior to admission, light assist to stabilize, pt reliant on UEs on armrests to self assist    Ambulation/Gait               General Gait Details: unable d/t pain and decreased cognition  Stairs            Wheelchair Mobility     Tilt Bed    Modified Rankin (Stroke Patients Only)       Balance Overall balance assessment: History of Falls                                           Pertinent Vitals/Pain Pain Assessment Pain Assessment: Faces Faces Pain Scale: Hurts whole lot Pain Location: sacral area, back Pain Descriptors / Indicators: Grimacing, Guarding, Moaning  Pain Intervention(s): Monitored during session, Repositioned    Home Living Family/patient expects to be discharged to:: Private residence Living Arrangements: Alone Available Help at Discharge: Available PRN/intermittently;Family Type of Home: House Home Access: Stairs to enter   [employer] of Steps: 1   Home Layout: One level Home Equipment: Agricultural consultant (2 wheels);Transport chair;Wheelchair - manual      Prior Function Prior Level of  Function : Independent/Modified Independent             Mobility Comments: was using RW prior to last admission, had only been transferring upon return home from rehab per daughter       Hand Dominance        Extremity/Trunk Assessment        Lower Extremity Assessment Lower Extremity Assessment: Generalized weakness       Communication   Communication: No difficulties  Cognition Arousal/Alertness: Awake/alert Behavior During Therapy: Restless Overall Cognitive Status: Impaired/Different from baseline Area of Impairment: Problem solving, Following commands                       Following Commands: Follows one step commands consistently     Problem Solving: Slow processing, Decreased initiation, Requires verbal cues, Difficulty sequencing          General Comments      Exercises     Assessment/Plan    PT Assessment Patient needs continued PT services  PT Problem List Decreased mobility;Decreased balance;Decreased activity tolerance;Decreased strength;Pain;Decreased knowledge of use of DME;Decreased cognition       PT Treatment Interventions DME instruction;Gait training;Balance training;Therapeutic exercise;Functional mobility training;Therapeutic activities;Patient/family education    PT Goals (Current goals can be found in the Care Plan section)  Acute Rehab PT Goals PT Goal Formulation: With patient/family Time For Goal Achievement: 02/09/23 Potential to Achieve Goals: Fair    Frequency Min 1X/week     Co-evaluation               AM-PAC PT "6 Clicks" Mobility  Outcome Measure Help needed turning from your back to your side while in a flat bed without using bedrails?: A Little Help needed moving from lying on your back to sitting on the side of a flat bed without using bedrails?: A Little Help needed moving to and from a bed to a chair (including a wheelchair)?: A Little Help needed standing up from a chair using your arms  (e.g., wheelchair or bedside chair)?: A Little Help needed to walk in hospital room?: Total Help needed climbing 3-5 steps with a railing? : Total 6 Click Score: 14    End of Session Equipment Utilized During Treatment: Gait belt Activity Tolerance: Patient limited by pain Patient left: in chair;with call bell/phone within reach;with chair alarm set;with family/visitor present;with nursing/sitter in room Nurse Communication: Mobility status PT Visit Diagnosis: Other abnormalities of gait and mobility (R26.89)    Time: 1150-1210 PT Time Calculation (min) (ACUTE ONLY): 20 min   Charges:   PT Evaluation $PT Eval Low Complexity: 1 Low   PT General Charges $$ ACUTE PT VISIT: 1 Visit       Thomasene Mohair PT, DPT Physical Therapist Acute Rehabilitation Services Office: [phone]   Janan Halter [place] 01/26/2023, 3:27 PM

## 2023-01-26 NOTE — ED Provider Notes (Signed)
[place] EMERGENCY DEPARTMENT AT [facility] Provider Note   CSN: [id number] Arrival date & time: 01/26/23  0101     History  Chief Complaint  Patient presents with   Back Pain    Leslie [name] is a 72 y.o. female.   72 y.o. female with past medical history significant for chronic systolic congestive heart failure, paroxysmal atrial fibrillation on anticoagulation, ADHD, HTN, obesity s/p gastric bypass, s/p nephrectomy presenting with ongoing back pain, hip pain and upper leg pain.  States she has pain from "waist to knees".  This pain has been constant since she was discharged from the hospital.  She went to rehab and was discharged home on July 2.  There was some kind of problem with her prescriptions and she was not sent home with proper doses of Norco or fentanyl patches.  States her last dose of Norco was 2 days ago.  She called her PCP and has an appointment with her on Monday.'  She denies any new fall or trauma.  She complains of ongoing low back pain which is constant and unchanged since her surgery.  Pain radiates to her bilateral hips down her legs to her knees.  No new weakness, numbness or tingling.  No bowel or bladder incontinence.  No fever or vomiting.  No chest pain or shortness of breath.  She states she was discharged with paper prescriptions from her rehab facility but there was some mixup and she was not given proper prescriptions.  She has not been able to walk since she had her surgery but has been using a walker and wheelchair. States she was not happy with her rehab facility which was "a house of horrors". Apparently she was sent home with blister packs of medications but no prescriptions for any kind of pain medication.  She states she never received fentanyl patches.  The history is provided by the patient.  Back Pain Associated symptoms: weakness   Associated symptoms: no abdominal pain, no chest pain, no dysuria, no fever and no headaches         Home Medications Prior to Admission medications   Medication Sig Start Date End Date Taking? Authorizing Provider  acetaminophen (TYLENOL) 500 MG tablet Take 1,000 mg by mouth every 6 (six) hours as needed for moderate pain or mild pain.    [provider]  ALPRAZolam Prudy Feeler) 0.25 MG tablet Take 1 tablet (0.25 mg total) by mouth at bedtime as needed for anxiety. TAKE 1 TABLET(0.25 MG) BY MOUTH AT BEDTIME AS NEEDED FOR ANXIETY Strength: 0.25 mg 01/09/23   [place], Alvira Philips, DO  amiodarone (PACERONE) 200 MG tablet Take 200 mg by mouth daily.    [provider]  amphetamine-dextroamphetamine (ADDERALL XR) 30 MG 24 hr capsule Take 1 capsule (30 mg total) by mouth every morning. 01/09/23   [place], Alvira Philips, DO  amphetamine-dextroamphetamine (ADDERALL) 20 MG tablet Take 1 tablet (20 mg total) by mouth daily. 01/09/23   [place], Alvira Philips, DO  apixaban (ELIQUIS) 5 MG TABS tablet Take 1 tablet (5 mg total) by mouth 2 (two) times daily. 09/09/22   Pricilla Riffle, MD  atorvastatin (LIPITOR) 40 MG tablet Take 1 tablet (40 mg total) by mouth daily. 12/17/22   Pricilla Riffle, MD  dapagliflozin propanediol (FARXIGA) 10 MG TABS tablet Take 1 tablet (10 mg total) by mouth daily before breakfast. 12/31/22   Sabharwal, Aditya, DO  fentaNYL (DURAGESIC) 25 MCG/HR Place 1 patch onto the skin every 3 (  three) days. 01/09/23   [place], Eric J, DO  FLUoxetine (PROZAC) 40 MG capsule TAKE 1 CAPSULE(40 MG) BY MOUTH EVERY MORNING Patient taking differently: Take 40 mg by mouth in the morning. 12/26/22   Philip Aspen, Limmie Patricia, MD  furosemide (LASIX) 40 MG tablet Take 20 mg by mouth as needed. Take one half (0.5) tablet by mouth ( 20 mg) as needed for wt gain of 3 lbs in 24 hours or 5 lbs in one week.    [provider]  HYDROcodone-acetaminophen (NORCO/VICODIN) 5-325 MG tablet Take 1-2 tablets by mouth every 4 (four) hours as needed for moderate pain or severe pain. 01/09/23   [place], Alvira Philips, DO   lamoTRIgine (LAMICTAL) 100 MG tablet TAKE 1 TABLET(100 MG) BY MOUTH TWICE DAILY Patient taking differently: Take 100 mg by mouth 2 (two) times daily. 11/13/22   Philip Aspen, Limmie Patricia, MD  lidocaine (LIDODERM) 5 % Place 2 patches onto the skin daily. Remove & Discard patch within 12 hours or as directed by MD 01/09/23   [place], Alvira Philips, DO  losartan (COZAAR) 25 MG tablet Take 0.5 tablets (12.5 mg total) by mouth daily. 11/15/22 02/13/23  Sabharwal, Aditya, DO  melatonin 3 MG TABS tablet Take 1 tablet (3 mg total) by mouth at bedtime. 01/09/23   [place], Alvira Philips, DO  methocarbamol (ROBAXIN) 500 MG tablet Take 1 tablet (500 mg total) by mouth every 6 (six) hours as needed for muscle spasms. 01/09/23   [place], Alvira Philips, DO  metoprolol succinate (TOPROL-XL) 50 MG 24 hr tablet Take 1 tablet (50 mg total) by mouth at bedtime. Take with or immediately following a meal. 11/15/22 02/13/23  Sabharwal, Aditya, DO  Multiple Vitamins-Minerals (PRESERVISION AREDS 2) CAPS Take 1 capsule by mouth 2 (two) times daily.    [provider]  polyethylene glycol (MIRALAX / GLYCOLAX) 17 g packet Take 17 g by mouth daily as needed. 01/09/23   [place], Alvira Philips, DO  potassium chloride (KLOR-CON) 10 MEQ tablet Take 10 mEq by mouth daily. 11/09/22   [provider]  senna-docusate (SENOKOT-S) 8.6-50 MG tablet Take 2 tablets by mouth 2 (two) times daily. 01/09/23   [place], Alvira Philips, DO  spironolactone (ALDACTONE) 25 MG tablet Take 0.5 tablets (12.5 mg total) by mouth daily. 10/30/22   Robbie Lis M, PA-C  Vitamin D, Ergocalciferol, (DRISDOL) 1.25 MG (50000 UNIT) CAPS capsule Take 1 capsule (50,000 Units total) by mouth every 7 (seven) days. 01/10/23   [place], Alvira Philips, DO  zolpidem (AMBIEN) 10 MG tablet Take 1 tablet (10 mg total) by mouth at bedtime. TAKE 1 TABLET(10 MG) BY MOUTH AT BEDTIME 01/09/23 02/08/23  [place], Alvira Philips, DO      Allergies    Amoxicillin and Sulfamethoxazole    Review of Systems   Review of  Systems  Constitutional:  Negative for activity change, appetite change and fever.  HENT:  Negative for congestion and rhinorrhea.   Respiratory:  Negative for cough, chest tightness and shortness of breath.   Cardiovascular:  Negative for chest pain.  Gastrointestinal:  Negative for abdominal pain, nausea and vomiting.  Genitourinary:  Negative for dysuria and hematuria.  Musculoskeletal:  Positive for back pain.  Skin:  Negative for rash.  Neurological:  Positive for weakness. Negative for headaches.   all other systems are negative except as noted in the HPI and PMH.    Physical Exam Updated Vital Signs BP (!) 145/71 (BP Location: Left Arm)   Pulse (!) 59  Temp 98.1 F (36.7 C) (Oral)   Resp 18   Ht 5\' 4"  (1.626 m)   Wt 85.3 kg   SpO2 97%   BMI 32.27 kg/m  Physical Exam Vitals and nursing note reviewed.  Constitutional:      General: She is not in acute distress.    Appearance: She is well-developed.  HENT:     Head: Normocephalic and atraumatic.     Mouth/Throat:     Pharynx: No oropharyngeal exudate.  Eyes:     Conjunctiva/sclera: Conjunctivae normal.     Pupils: Pupils are equal, round, and reactive to light.  Neck:     Comments: No meningismus. Cardiovascular:     Rate and Rhythm: Normal rate and regular rhythm.     Heart sounds: Normal heart sounds. No murmur heard. Pulmonary:     Effort: Pulmonary effort is normal. No respiratory distress.     Breath sounds: Normal breath sounds.  Abdominal:     Palpations: Abdomen is soft.     Tenderness: There is no abdominal tenderness. There is no guarding or rebound.  Musculoskeletal:        General: Tenderness present. Normal range of motion.     Cervical back: Normal range of motion and neck supple.     Comments: Midline lumbar tenderness, no step-offs  Able to lift bilateral legs off the bed bilaterally.  Ankle flexion extension intact bilaterally.  Intact DP and PT pulses bilaterally.  Compartments are soft.   Equal strength to lower extremities 4/5  Skin:    General: Skin is warm.  Neurological:     Mental Status: She is alert and oriented to person, place, and time.     Cranial Nerves: No cranial nerve deficit.     Motor: No abnormal muscle tone.     Coordination: Coordination normal.     Comments: No ataxia on finger to nose bilaterally. No pronator drift. 5/5 strength throughout. CN 2-12 intact.Equal grip strength. Sensation intact.   Psychiatric:        Behavior: Behavior normal.     ED Results / Procedures / Treatments   Labs (all labs ordered are listed, but only abnormal results are displayed) Labs Reviewed  CBC WITH DIFFERENTIAL/PLATELET - Abnormal; Notable for the following components:      Result Value   RBC 3.66 (*)    Hemoglobin 11.5 (*)    All other components within normal limits  COMPREHENSIVE METABOLIC PANEL - Abnormal; Notable for the following components:   Sodium 127 (*)    CO2 18 (*)    AST 133 (*)    ALT 112 (*)    Alkaline Phosphatase 380 (*)    All other components within normal limits  CK  ACETAMINOPHEN LEVEL  URINALYSIS, ROUTINE W REFLEX MICROSCOPIC  PROTIME-INR    EKG None  Radiology CT PELVIS WO CONTRAST  Result Date: 01/26/2023 CLINICAL DATA:  Hip trauma with fracture suspected. Recent sacral fracture peer EXAM: CT PELVIS WITHOUT CONTRAST TECHNIQUE: Multidetector CT imaging of the pelvis was performed following the standard protocol without intravenous contrast. RADIATION DOSE REDUCTION: This exam was performed according to the departmental dose-optimization program which includes automated exposure control, adjustment of the mA and/or kV according to patient size and/or use of iterative reconstruction technique. COMPARISON:  Lumbar CT 01/04/2023 FINDINGS: Subacute sacral insufficiency fractures involving both ala and transversely across the S1-2 level. Anterior displacement at the S1-2 fracture plane measures 11 mm, increased from before when there was  only slight cortical  offset. There is fracture reabsorption along the bilateral ala components which have undergone interval cement augmentation. Bilateral L5 transverse process fractures. No obturator ring fractures. Both hips appear intact and located. Expected presacral edema. Dystrophic calcification in fat along the bilateral ischial tuberosities attributed to pressure IMPRESSION: 1. Subacute sacral insufficiency fractures with progressive displacement of the horizontal component at S1-2 when compared to 01/02/2023. Interval sacroplasty on both sides. 2. L5 bilateral transverse process fractures, interval on the right since 01/02/2023. Electronically Signed   By: Tiburcio Pea M.D.   On: 01/26/2023 04:26   CT Lumbar Spine Wo Contrast  Result Date: 01/26/2023 CLINICAL DATA:  Back trauma.  Recent sacral fracture. EXAM: CT LUMBAR SPINE WITHOUT CONTRAST TECHNIQUE: Multidetector CT imaging of the lumbar spine was performed without intravenous contrast administration. Multiplanar CT image reconstructions were also generated. RADIATION DOSE REDUCTION: This exam was performed according to the departmental dose-optimization program which includes automated exposure control, adjustment of the mA and/or kV according to patient size and/or use of iterative reconstruction technique. COMPARISON:  01/02/2023 lumbar MRI FINDINGS: Segmentation: 5 lumbar type vertebrae. Alignment: Mild degenerative anterolisthesis at L4-5 Vertebrae: Pronounced osteopenia. There are bilateral transverse process fractures at L5, interval on the right when compared to prior. No acute vertebral body fracture. Progressive sacral insufficiency fracture described on dedicated pelvis CT. Paraspinal and other soft tissues: Presacral edema related to the known fracture. Atherosclerosis and left nephrectomy. Disc levels: Lumbar spine degeneration especially affects facets at L3-4 and below with the L4-5 anterolisthesis described above. Spinal stenosis  at L3-4 and L4-5, especially advanced at L4-5. The foramina appear patent. IMPRESSION: 1. L5 subacute bilateral transverse process fracture, new on the right since comparison 01/02/2023. 2. Sacral insufficiency fracture described on dedicated pelvis CT reported separately. 3. Lumbar degeneration especially affecting facets with L4-5 anterolisthesis. Compressive spinal stenosis at L3-4 and especially L4-5. Electronically Signed   By: Tiburcio Pea M.D.   On: 01/26/2023 04:20    Procedures Procedures    Medications Ordered in ED Medications  HYDROcodone-acetaminophen (NORCO/VICODIN) 5-325 MG per tablet 2 tablet (has no administration in time range)    ED Course/ Medical Decision Making/ A&P                             Medical Decision Making Amount and/or Complexity of Data Reviewed Independent Historian: EMS Labs: ordered. Decision-making details documented in ED Course. Radiology: ordered and independent interpretation performed. Decision-making details documented in ED Course. ECG/medicine tests: ordered and independent interpretation performed. Decision-making details documented in ED Course.  Risk Prescription drug management. Decision regarding hospitalization.   Back pain and leg pain ongoing since her fall on June 20.  Sustained sacral fracture which was repaired.  Discharged from rehab on July 2 with an inadequate supply of pain medication.  Denies any new fall or trauma.  Complains of pain to her low back and her bilateral upper legs.  Low Suspicion for cord compression or cauda equina.  Does have chronic pain in her low back and legs since her previous fall and surgery. No new weakness, numbness or tingling.  No bowel or bladder incontinence.  Labs today show mild hyponatremia of 127.  Mild transaminitis.  Patient denies any abdominal pain or excessive acetaminophen ingestion.  CT scan as above shows displacement of sacral fracture as well as transverse process  fractures.  Patient declines admission to the hospital.  States she has adequate help at home and has physical  therapy scheduled as well as home health.  She declines wanting to go back to rehab.  CT findings d/w Dr. Jake Samples of neurosurgery.  He agrees pain control weightbearing as tolerated.  No indication for any kind of bracing.  Narcotic database reviewed.  Patient did have 60 tablets of hydrocodone/acetaminophen filled on June 26.  She also had another 30 tablets filled on June 30.  Patient time she was discharged from her nursing facility with blister packs only and no prescriptions.  Unclear where these pain medication went.  She adamantly denies any diversion of her family members.  Unclear etiology of patient's elevated LFTs.  Right upper quadrant ultrasound shows changes of postcholecystectomy.  Acetaminophen level was 12.  Some concern for subacute and chronic acetaminophen ingestion.  Discussed with poison control.  They do agree and recommend 12 hours of N-acetylcysteine followed by repeat labs.  Patient did receive 1 dose of hydrocodone in the ED prior to her LFTs being known.  She states she did take 500 mg of acetaminophen last night about 4 PM.  Her last hydrocodone dose was approximately 2 days ago. She has not clear what she is taking at home for pain or when she is taking acetaminophen.  She declines going back to rehab.  She will need admission for N-acetylcysteine infusion and recheck of her LFTs.  May benefit from PT evaluation while she is here and possible consideration of rehab placement again.    Asked with Dr. Margo Aye.          Final Clinical Impression(s) / ED Diagnoses Final diagnoses:  Closed fracture of sacrum with routine healing, unspecified portion of sacrum, subsequent encounter  Acetaminophen overdose, undetermined intent, initial encounter    Rx / DC Orders ED Discharge Orders          Ordered    CT LUMBAR SPINE WO CONTRAST        01/26/23  0300    CT PELVIS WO CONTRAST        01/26/23 0300              Glynn Octave, MD 01/26/23 [phone]

## 2023-01-26 NOTE — ED Notes (Signed)
Pt unable to ambulate. 

## 2023-01-27 ENCOUNTER — Inpatient Hospital Stay: Payer: Medicare PPO | Admitting: Internal Medicine

## 2023-01-27 DIAGNOSIS — T391X1A Poisoning by 4-Aminophenol derivatives, accidental (unintentional), initial encounter: Secondary | ICD-10-CM | POA: Diagnosis not present

## 2023-01-27 LAB — CBC
HCT: 36.3 % (ref 36.0–46.0)
Hemoglobin: 11.7 g/dL — ABNORMAL LOW (ref 12.0–15.0)
MCH: 31.6 pg (ref 26.0–34.0)
MCHC: 32.2 g/dL (ref 30.0–36.0)
MCV: 98.1 fL (ref 80.0–100.0)
Platelets: 289 10*3/uL (ref 150–400)
RBC: 3.7 MIL/uL — ABNORMAL LOW (ref 3.87–5.11)
RDW: 11.9 % (ref 11.5–15.5)
WBC: 7.1 10*3/uL (ref 4.0–10.5)
nRBC: 0 % (ref 0.0–0.2)

## 2023-01-27 LAB — COMPREHENSIVE METABOLIC PANEL
ALT: 90 U/L — ABNORMAL HIGH (ref 0–44)
AST: 59 U/L — ABNORMAL HIGH (ref 15–41)
Albumin: 3.3 g/dL — ABNORMAL LOW (ref 3.5–5.0)
Alkaline Phosphatase: 323 U/L — ABNORMAL HIGH (ref 38–126)
Anion gap: 12 (ref 5–15)
BUN: 11 mg/dL (ref 8–23)
CO2: 20 mmol/L — ABNORMAL LOW (ref 22–32)
Calcium: 9.4 mg/dL (ref 8.9–10.3)
Chloride: 98 mmol/L (ref 98–111)
Creatinine, Ser: 0.77 mg/dL (ref 0.44–1.00)
GFR, Estimated: >60 mL/min (ref >60)
Glucose, Bld: 86 mg/dL (ref 70–99)
Potassium: 4.2 mmol/L (ref 3.5–5.1)
Sodium: 130 mmol/L — ABNORMAL LOW (ref 135–145)
Total Bilirubin: 0.5 mg/dL (ref 0.3–1.2)
Total Protein: 6.4 g/dL — ABNORMAL LOW (ref 6.5–8.1)

## 2023-01-27 MED ORDER — OXYCODONE HCL 5 MG PO TABS
2.5000 mg | ORAL_TABLET | ORAL | Status: DC | PRN
  Administered 2023-01-27 – 2023-01-30 (×14): 2.5 mg via ORAL
  Filled 2023-01-27 (×14): qty 1

## 2023-01-27 MED ORDER — GABAPENTIN 300 MG PO CAPS
300.0000 mg | ORAL_CAPSULE | Freq: Two times a day (BID) | ORAL | Status: DC
  Administered 2023-01-27 – 2023-01-30 (×6): 300 mg via ORAL
  Filled 2023-01-27 (×6): qty 1

## 2023-01-27 MED ORDER — CYANOCOBALAMIN 1000 MCG/ML IJ SOLN
1000.0000 ug | Freq: Once | INTRAMUSCULAR | Status: AC
  Administered 2023-01-27: 1000 ug via INTRAMUSCULAR
  Filled 2023-01-27: qty 1

## 2023-01-27 NOTE — Progress Notes (Addendum)
HOSPITALIST ROUNDING NOTE Leslie [name]:[id number]  DOB: 01/19/1951  DOA: 01/26/2023  PCP: Philip Aspen, Limmie Patricia, MD  01/27/2023,12:38 PM   LOS: 0 days      Code Status: full    From:  home    Current Dispo: unclear     72 year old white female Previous diagnosis ADD OA knee hypertension Single kidney donation 2004  Admission 2/29 through 10/09/2022 MAT treated by Dr. Heloise Beecham EF 25-30% previously with echocardiogram showing atrial flutter on anticoagulation--hospitalization complicated by mild AKI-GDMT was contemplated and patient was discharged Rehospitalized 01/02/2023 and fell down had worsening pain also had mildly displaced/nondisplaced sacral insufficiency fractures along with severe spinal stenosis---Underwent sacroplasty 01/07/23 placed on lidocaine patch Norco fentanyl patch and discharged to [facility] skilled ---   discharged from rehab 01/21/2023 and came to ED 7/7 as she did not have adequate pain control and had been taking Tylenol around-the-clock   LFT AST/ALT---133-112  alk phos 380, bicarb 18, sodium 127, BUN/creatinine  17/0.9   Poison control consulted secondary to elevated liver enzymes --placed on Mucomyst  Plan  Accidental Tylenol OD--prior moderate use of EtOH prior to admission 6/13 Poison control contacted 7/7 PM and Mucomyst has been discontinued LFTs trend tomorrow to see if returns close to baseline It would be beneficial for her to not use Tylenol containing products in the future-she also probably needs to hold off on ethanol going forward Continue NS 50 cc/H and stop in a.m.  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 with next lab I have explained to her that we are not going to resolve all of her pain including her polyneuropathy but we will attempt higher dose of gabapentin 300 twice daily and watch for  sedation Outpatient neurosurgeon to reevaluate and discuss operative management when able  Sacral insufficiency fractures status post sacroplasty 01/07/2023 Cut back oxycodone to 2.5 every 4 as needed moderate pain as the majority of her pain seems to be in her feet with the neuropathy--- May continue 25 mcg of fentanyl patch for the time being and needs to be cut back and then next 5 to 10 days to half of this as she improves mobility Patient and daughter contemplating with home caregivers next steps-TOC to discuss further  Paroxysmal A-fib/a flutter CHADVASC >4-planned A-fib ablation 03/2023? Continue apixaban 5 twice daily, amiodarone 200 daily, Toprol-XL 50 daily-remains in sinus rhythm currently and will need outpatient cardiology follow-up  Htn Combined systolic diastolic heart failure last EF 45% 12/19/2022 Cont losartan 12.5 every day Aldactone from PTA 12.5 mg has been held, Lasix 40 has been held for now and can be resumed  Depression/anxiety Continue Prozac 40 every morning, Xanax 0.25 at bedtime as needed-would stop Ativan As an outpatient would probably resume Adderall-would hold at this time  S/p GOP ADHD   DVT prophylaxis: Apixaban  Status is: Observation The patient will require care spanning > 2 midnights and should be moved to inpatient because:   Requires increasing mobility etc.    Subjective: Main pain right now is in the lower extremities it is a burning type pain-daughter explains to me she has been treated for neuropathy previously No chest pain No nausea    Objective + exam Vitals:   01/26/23 1555 01/26/23 2143 01/27/23 0525 01/27/23 1006  BP: (!) 148/56 (!) 141/95 114/61 129/66  Pulse: 66 72 72 63  Resp: 18 17 17  Temp: 98.1 F (36.7 C) 98.1 F (36.7 C) 97.7 F (36.5 C)   TempSrc: Oral Oral Oral   SpO2: 100% 100% 98%   Weight:      Height:       Filed Weights   01/26/23 0112  Weight: 85.3 kg    Examination:  Eomi ncat no ict Some  mild tremor--cta b no added sound no wheeze rales rhonchi Abd soft no rebound Able to SLR, able to sit up with minimal assistance in the bed Power 5/5   Data Reviewed: reviewed   CBC    Component Value Date/Time   WBC 7.1 01/27/2023 0318   RBC 3.70 (L) 01/27/2023 0318   HGB 11.7 (L) 01/27/2023 0318   HGB 10.6 (L) 09/23/2022 1631   HCT 36.3 01/27/2023 0318   HCT 32.1 (L) 09/23/2022 1631   PLT 289 01/27/2023 0318   PLT 310 09/23/2022 1631   MCV 98.1 01/27/2023 0318   MCV 99 (H) 09/23/2022 1631   MCH 31.6 01/27/2023 0318   MCHC 32.2 01/27/2023 0318   RDW 11.9 01/27/2023 0318   RDW 11.8 09/23/2022 1631   LYMPHSABS 0.7 01/26/2023 0323   MONOABS 0.5 01/26/2023 0323   EOSABS 0.2 01/26/2023 0323   BASOSABS 0.1 01/26/2023 0323      Latest Ref Rng & Units 01/27/2023    3:18 AM 01/26/2023    7:28 PM 01/26/2023    3:23 AM  CMP  Glucose 70 - 99 mg/dL 86  161  99   BUN 8 - 23 mg/dL 11  13  17    Creatinine 0.44 - 1.00 mg/dL 0.96  0.45  4.09   Sodium 135 - 145 mmol/L 130  128  127   Potassium 3.5 - 5.1 mmol/L 4.2  3.3  3.7   Chloride 98 - 111 mmol/L 98  97  99   CO2 22 - 32 mmol/L 20  17  18    Calcium 8.9 - 10.3 mg/dL 9.4  9.2  9.2   Total Protein 6.5 - 8.1 g/dL 6.4  6.6  6.6   Total Bilirubin 0.3 - 1.2 mg/dL 0.5  0.7  0.6   Alkaline Phos 38 - 126 U/L 323  370  380   AST 15 - 41 U/L 59  83  133   ALT 0 - 44 U/L 90  109  112      Scheduled Meds:  amiodarone  200 mg Oral Daily   apixaban  5 mg Oral BID   cyanocobalamin  1,000 mcg Intramuscular Once   dapagliflozin propanediol  10 mg Oral QAC breakfast   fentaNYL  1 patch Transdermal Q72H   FLUoxetine  40 mg Oral q AM   gabapentin  300 mg Oral BID   lamoTRIgine  100 mg Oral BID   losartan  12.5 mg Oral Daily   melatonin  3 mg Oral QHS   metoprolol succinate  50 mg Oral Daily   Continuous Infusions:  sodium chloride 50 mL/hr at 01/26/23 2143    Time 36  Rhetta Mura, MD  [facility]

## 2023-01-28 DIAGNOSIS — E669 Obesity, unspecified: Secondary | ICD-10-CM | POA: Diagnosis present

## 2023-01-28 DIAGNOSIS — T391X1A Poisoning by 4-Aminophenol derivatives, accidental (unintentional), initial encounter: Secondary | ICD-10-CM | POA: Diagnosis present

## 2023-01-28 DIAGNOSIS — F419 Anxiety disorder, unspecified: Secondary | ICD-10-CM | POA: Diagnosis present

## 2023-01-28 DIAGNOSIS — Z6832 Body mass index (BMI) 32.0-32.9, adult: Secondary | ICD-10-CM | POA: Diagnosis not present

## 2023-01-28 DIAGNOSIS — G629 Polyneuropathy, unspecified: Secondary | ICD-10-CM | POA: Diagnosis present

## 2023-01-28 DIAGNOSIS — E876 Hypokalemia: Secondary | ICD-10-CM | POA: Diagnosis not present

## 2023-01-28 DIAGNOSIS — Z8249 Family history of ischemic heart disease and other diseases of the circulatory system: Secondary | ICD-10-CM | POA: Diagnosis not present

## 2023-01-28 DIAGNOSIS — Z79899 Other long term (current) drug therapy: Secondary | ICD-10-CM | POA: Diagnosis not present

## 2023-01-28 DIAGNOSIS — D7589 Other specified diseases of blood and blood-forming organs: Secondary | ICD-10-CM | POA: Diagnosis present

## 2023-01-28 DIAGNOSIS — F909 Attention-deficit hyperactivity disorder, unspecified type: Secondary | ICD-10-CM | POA: Diagnosis present

## 2023-01-28 DIAGNOSIS — I5042 Chronic combined systolic (congestive) and diastolic (congestive) heart failure: Secondary | ICD-10-CM | POA: Diagnosis present

## 2023-01-28 DIAGNOSIS — I110 Hypertensive heart disease with heart failure: Secondary | ICD-10-CM | POA: Diagnosis present

## 2023-01-28 DIAGNOSIS — R7401 Elevation of levels of liver transaminase levels: Secondary | ICD-10-CM | POA: Diagnosis present

## 2023-01-28 DIAGNOSIS — Z87891 Personal history of nicotine dependence: Secondary | ICD-10-CM | POA: Diagnosis not present

## 2023-01-28 DIAGNOSIS — I4892 Unspecified atrial flutter: Secondary | ICD-10-CM | POA: Diagnosis present

## 2023-01-28 DIAGNOSIS — E785 Hyperlipidemia, unspecified: Secondary | ICD-10-CM | POA: Diagnosis present

## 2023-01-28 DIAGNOSIS — D638 Anemia in other chronic diseases classified elsewhere: Secondary | ICD-10-CM | POA: Diagnosis present

## 2023-01-28 DIAGNOSIS — F32A Depression, unspecified: Secondary | ICD-10-CM | POA: Diagnosis present

## 2023-01-28 DIAGNOSIS — E538 Deficiency of other specified B group vitamins: Secondary | ICD-10-CM | POA: Diagnosis present

## 2023-01-28 DIAGNOSIS — W1830XD Fall on same level, unspecified, subsequent encounter: Secondary | ICD-10-CM | POA: Diagnosis not present

## 2023-01-28 DIAGNOSIS — Z96653 Presence of artificial knee joint, bilateral: Secondary | ICD-10-CM | POA: Diagnosis present

## 2023-01-28 DIAGNOSIS — Z7901 Long term (current) use of anticoagulants: Secondary | ICD-10-CM | POA: Diagnosis not present

## 2023-01-28 DIAGNOSIS — I480 Paroxysmal atrial fibrillation: Secondary | ICD-10-CM | POA: Diagnosis present

## 2023-01-28 DIAGNOSIS — M8448XD Pathological fracture, other site, subsequent encounter for fracture with routine healing: Secondary | ICD-10-CM | POA: Diagnosis present

## 2023-01-28 DIAGNOSIS — E871 Hypo-osmolality and hyponatremia: Secondary | ICD-10-CM | POA: Diagnosis present

## 2023-01-28 LAB — CBC WITH DIFFERENTIAL/PLATELET
Abs Immature Granulocytes: 0.03 10*3/uL (ref 0.00–0.07)
Basophils Absolute: 0.1 10*3/uL (ref 0.0–0.1)
Basophils Relative: 1 %
Eosinophils Absolute: 0.2 10*3/uL (ref 0.0–0.5)
Eosinophils Relative: 4 %
HCT: 32 % — ABNORMAL LOW (ref 36.0–46.0)
Hemoglobin: 10.4 g/dL — ABNORMAL LOW (ref 12.0–15.0)
Immature Granulocytes: 1 %
Lymphocytes Relative: 13 %
Lymphs Abs: 0.7 10*3/uL (ref 0.7–4.0)
MCH: 32.7 pg (ref 26.0–34.0)
MCHC: 32.5 g/dL (ref 30.0–36.0)
MCV: 100.6 fL — ABNORMAL HIGH (ref 80.0–100.0)
Monocytes Absolute: 0.5 10*3/uL (ref 0.1–1.0)
Monocytes Relative: 9 %
Neutro Abs: 3.9 10*3/uL (ref 1.7–7.7)
Neutrophils Relative %: 72 %
Platelets: 225 10*3/uL (ref 150–400)
RBC: 3.18 MIL/uL — ABNORMAL LOW (ref 3.87–5.11)
RDW: 12 % (ref 11.5–15.5)
WBC: 5.5 10*3/uL (ref 4.0–10.5)
nRBC: 0 % (ref 0.0–0.2)

## 2023-01-28 LAB — VITAMIN B12: Vitamin B-12: >7500 pg/mL — ABNORMAL HIGH (ref 180–914)

## 2023-01-28 LAB — COMPREHENSIVE METABOLIC PANEL
ALT: 59 U/L — ABNORMAL HIGH (ref 0–44)
AST: 32 U/L (ref 15–41)
Albumin: 3.1 g/dL — ABNORMAL LOW (ref 3.5–5.0)
Alkaline Phosphatase: 246 U/L — ABNORMAL HIGH (ref 38–126)
Anion gap: 8 (ref 5–15)
BUN: 11 mg/dL (ref 8–23)
CO2: 19 mmol/L — ABNORMAL LOW (ref 22–32)
Calcium: 8.8 mg/dL — ABNORMAL LOW (ref 8.9–10.3)
Chloride: 100 mmol/L (ref 98–111)
Creatinine, Ser: 0.86 mg/dL (ref 0.44–1.00)
GFR, Estimated: >60 mL/min (ref >60)
Glucose, Bld: 88 mg/dL (ref 70–99)
Potassium: 3.3 mmol/L — ABNORMAL LOW (ref 3.5–5.1)
Sodium: 127 mmol/L — ABNORMAL LOW (ref 135–145)
Total Bilirubin: 0.7 mg/dL (ref 0.3–1.2)
Total Protein: 5.8 g/dL — ABNORMAL LOW (ref 6.5–8.1)

## 2023-01-28 MED ORDER — SPIRONOLACTONE 12.5 MG HALF TABLET
12.5000 mg | ORAL_TABLET | Freq: Every day | ORAL | Status: DC
  Administered 2023-01-28 – 2023-01-30 (×3): 12.5 mg via ORAL
  Filled 2023-01-28 (×3): qty 1

## 2023-01-28 MED ORDER — FUROSEMIDE 20 MG PO TABS
20.0000 mg | ORAL_TABLET | Freq: Every day | ORAL | Status: DC
  Administered 2023-01-28 – 2023-01-30 (×3): 20 mg via ORAL
  Filled 2023-01-28 (×3): qty 1

## 2023-01-28 MED ORDER — POTASSIUM CHLORIDE 20 MEQ PO PACK
40.0000 meq | PACK | Freq: Once | ORAL | Status: AC
  Administered 2023-01-28: 40 meq via ORAL
  Filled 2023-01-28: qty 2

## 2023-01-28 NOTE — Progress Notes (Signed)
Physical Therapy Treatment Patient Details Name: Leslie [name] MRN: [id number] DOB: 23-Jul-1950 Today's Date: 01/28/2023   History of Present Illness 72 y.o. female with medical history significant for hypertension, atrial fibrillation on Eliquis, obesity s/p gastric bypass, s/p nephrectomy, bil TKAs, recent sacral fracture with sacroplasty on 6/18 who was discharged to rehab on 6/20 and now returns from home with severe uncontrolled pain and found to have elevated LFTs concerning for subacute/chronic unintentional Tylenol overdose.    PT Comments  Cognition improved. Appears at prior level of cognition.  On phone ordering her dinner.  Following all commands.  Just unable to recall early admission days. Daughter in room visiting. Pt in bed incont urine.  Assisted OOB required increased effort.  General bed mobility comments: required increased time and effort with greatest difficulty self scooting to EOB.  Used bed pad to complete. General transfer comment: pt required + 2 Max Assist to rise present with c/o B LE pain (Neuropathy) and increased tremors.  Max c/o weakness.  Assisted from elevated bed to [facility] 1/4 pivot was incomplete and required Total Assist to prevent fall as B knees buckled.  Second transfer required + 2 side by side while a third asisst performed peri care and switched the [facility] with the recliner from behind. Pt was unable to take any functional safe steps. Will consult/update LPT  Pt will need ST Rehab at SNF to address mobility and functional decline prior to safely returning home. Daughter agrees.       Assistance Recommended at Discharge Intermittent Supervision/Assistance  If plan is discharge home, recommend the following:  Can travel by private vehicle    Assistance with cooking/housework;Direct supervision/assist for medications management;Assist for transportation;Help with stairs or ramp for entrance;A lot of help with walking and/or transfers;A lot of help with  bathing/dressing/bathroom      Equipment Recommendations  None recommended by PT    Recommendations for Other Services       Precautions / Restrictions Precautions Precautions: Fall;Back Precaution Comments: back for comfort/Poly Neuropathy Restrictions Weight Bearing Restrictions: No     Mobility  Bed Mobility Overal bed mobility: Needs Assistance Bed Mobility: Rolling, Sidelying to Sit     Supine to sit: Max assist     General bed mobility comments: required increased time and effort with greatest difficulty self scooting to EOB.  Used bed pad to complete.    Transfers Overall transfer level: Needs assistance Equipment used: Rolling walker (2 wheels)   Sit to Stand: +2 physical assistance, +2 safety/equipment, Max assist           General transfer comment: pt required + 2 Max Assist to rise present with c/o B LE pain (Neuropathy) and increased tremors.  Max c/o weakness.  Assisted from elevated bed to [facility] 1/4 pivot was incomplete and required Total Assist to prevent fall as B knees buckled.  Second transfer required + 2 side by side while a third asisst performed peri care and switched the [facility] with the recliner from behind.    Ambulation/Gait               General Gait Details: unable to attempt due to poor transfer ability   Stairs             Wheelchair Mobility     Tilt Bed    Modified Rankin (Stroke Patients Only)       Balance  Cognition Arousal/Alertness: Awake/alert Behavior During Therapy: WFL for tasks assessed/performed Overall Cognitive Status: Within Functional Limits for tasks assessed                                 General Comments: appears at prior level of cognition.  On phone ordering her dinner.  Following all commands.  Just unable to recall early admission days.        Exercises      General Comments        Pertinent Vitals/Pain  Pain Assessment Pain Assessment: Faces Faces Pain Scale: Hurts little more Pain Location: sacral area, back Pain Descriptors / Indicators: Grimacing, Guarding, Moaning Pain Intervention(s): Monitored during session, Repositioned    Home Living                          Prior Function            PT Goals (current goals can now be found in the care plan section) Progress towards PT goals: Progressing toward goals    Frequency    Min 1X/week      PT Plan Current plan remains appropriate    Co-evaluation              AM-PAC PT "6 Clicks" Mobility   Outcome Measure  Help needed turning from your back to your side while in a flat bed without using bedrails?: A Lot Help needed moving from lying on your back to sitting on the side of a flat bed without using bedrails?: A Lot Help needed moving to and from a bed to a chair (including a wheelchair)?: A Lot Help needed standing up from a chair using your arms (e.g., wheelchair or bedside chair)?: A Lot Help needed to walk in hospital room?: Total Help needed climbing 3-5 steps with a railing? : Total 6 Click Score: 10    End of Session Equipment Utilized During Treatment: Gait belt Activity Tolerance: Patient limited by fatigue Patient left: in chair;with call bell/phone within reach;with chair alarm set;with family/visitor present;with nursing/sitter in room Nurse Communication: Mobility status PT Visit Diagnosis: Other abnormalities of gait and mobility (R26.89)     Time: 1610-9604 PT Time Calculation (min) (ACUTE ONLY): 26 min  Charges:    $Therapeutic Activity: 23-37 mins PT General Charges $$ ACUTE PT VISIT: 1 Visit                     Felecia Shelling  PTA Acute  Rehabilitation Services Office M-F          [phone]

## 2023-01-28 NOTE — [place] FL2 (Signed)
[place] MEDICAID FL2 LEVEL OF CARE FORM     IDENTIFICATION  Patient Name: Leslie [name] Birthdate: [birth year] Sex: female Admission Date (Current Location): 01/26/2023  [facility] and [place] Number:  Producer, television/film/video and Address:  [facility],  [address] [place], [place] [address]      Provider Number: [id number]  Attending Physician Name and Address:  Rhetta Mura, MD  Relative Name and Phone Number:  Shearon Stalls [phone]    Current Level of Care: Hospital Recommended Level of Care: Skilled Nursing Facility Prior Approval Number:    Date Approved/Denied:   PASRR Number: [id number]  Discharge Plan: SNF    Current Diagnoses: Patient Active Problem List   Diagnosis Date Noted   Tylenol overdose 01/28/2023   Acetaminophen overdose 01/26/2023   Malnutrition of moderate degree 01/08/2023   Sacral fracture (HCC) 01/02/2023   Chronic systolic CHF (congestive heart failure) (HCC) 01/02/2023   AKI (acute kidney injury) (HCC) 01/02/2023   S/P gastric bypass 01/02/2023   Renal insufficiency 09/19/2022   Acute CHF (congestive heart failure) (HCC) 08/07/2022   Status post right knee replacement 05/04/2021   Vitamin B12 deficiency 03/14/2021   Macrocytosis without anemia 03/14/2021   Vitamin D deficiency 02/17/2020   Sleep disorder 07/10/2018   ADHD (attention deficit hyperactivity disorder) 10/01/2017   Unilateral primary osteoarthritis, right knee 08/20/2017   Chronic pain of both shoulders 08/20/2017   Complete tear of right rotator cuff 04/29/2017   Osteoarthritis of left knee 06/16/2015   Status post total left knee replacement 06/16/2015   Attention deficit disorder 09/14/2007   COLONIC POLYPS, HX OF 08/03/2007   Essential hypertension 01/20/2007   OBESITY NOS 12/04/2006   Allergic rhinitis 12/04/2006   GASTROJEJUNOSTOMY, HX OF 12/04/2006   BREAST BIOPSY, HX OF 12/04/2006    Orientation RESPIRATION BLADDER Height & Weight      Self, Time, Situation, Place  Normal Continent Weight: 188 lb (85.3 kg) Height:  5\' 4"  (162.6 cm)  BEHAVIORAL SYMPTOMS/MOOD NEUROLOGICAL BOWEL NUTRITION STATUS      Continent Diet  AMBULATORY STATUS COMMUNICATION OF NEEDS Skin   Extensive Assist (+2 Sit to stand) Verbally Normal                       Personal Care Assistance Level of Assistance  Bathing, Dressing Bathing Assistance: Limited assistance Feeding assistance: Limited assistance Dressing Assistance: Limited assistance     Functional Limitations Info    Sight Info: Impaired (Glasses)   Speech Info: Adequate    SPECIAL CARE FACTORS FREQUENCY  PT (By licensed PT), OT (By licensed OT)     PT Frequency: 5 X a week OT Frequency: 5 X a week            Contractures Contractures Info: Not present    Additional Factors Info  Code Status Code Status Info: Full Allergies Info: Amoxicillin,Sulfamethoxazole Psychotropic Info: Xanax, Prozac, Lamictal         Current Medications (01/28/2023):  This is the current hospital active medication list Current Facility-Administered Medications  Medication Dose Route Frequency Provider Last Rate Last Admin   0.9 %  sodium chloride infusion   Intravenous Continuous Kirby Crigler, Mir M, MD 50 mL/hr at 01/27/23 1400 New Bag at 01/27/23 1400   albuterol (PROVENTIL) (2.5 MG/3ML) 0.083% nebulizer solution 2.5 mg  2.5 mg Nebulization Q2H PRN Kirby Crigler, Mir M, MD       ALPRAZolam Prudy Feeler) tablet 0.25 mg  0.25 mg Oral QHS PRN Kirby Crigler, Mir  M, MD   0.25 mg at 01/27/23 2154   amiodarone (PACERONE) tablet 200 mg  200 mg Oral Daily Kirby Crigler, Mir M, MD   200 mg at 01/28/23 1006   apixaban (ELIQUIS) tablet 5 mg  5 mg Oral BID Kirby Crigler, Mir M, MD   5 mg at 01/28/23 1006   dapagliflozin propanediol (FARXIGA) tablet 10 mg  10 mg Oral QAC breakfast Kirby Crigler, Mir M, MD   10 mg at 01/28/23 1003   fentaNYL (DURAGESIC) 25 MCG/HR 1 patch  1 patch Transdermal Q72H Kirby Crigler, Mir M, MD   1  patch at 01/26/23 1101   FLUoxetine (PROZAC) capsule 40 mg  40 mg Oral q AM Kirby Crigler, Mir M, MD   40 mg at 01/28/23 1004   furosemide (LASIX) tablet 20 mg  20 mg Oral Daily Rhetta Mura, MD       gabapentin (NEURONTIN) capsule 300 mg  300 mg Oral BID Rhetta Mura, MD   300 mg at 01/28/23 1007   lamoTRIgine (LAMICTAL) tablet 100 mg  100 mg Oral BID Kirby Crigler, Mir M, MD   100 mg at 01/28/23 1004   LORazepam (ATIVAN) tablet 1 mg  1 mg Oral Q4H PRN Kirby Crigler, Mir M, MD   1 mg at 01/28/23 1005   losartan (COZAAR) tablet 12.5 mg  12.5 mg Oral Daily Kirby Crigler, Mir M, MD   12.5 mg at 01/28/23 1006   melatonin tablet 3 mg  3 mg Oral QHS Kirby Crigler, Mir M, MD   3 mg at 01/27/23 2154   methocarbamol (ROBAXIN) tablet 500 mg  500 mg Oral Q6H PRN Kirby Crigler, Mir M, MD   500 mg at 01/28/23 0720   metoprolol succinate (TOPROL-XL) 24 hr tablet 50 mg  50 mg Oral Daily Kirby Crigler, Mir M, MD   50 mg at 01/28/23 1006   ondansetron (ZOFRAN) tablet 4 mg  4 mg Oral Q6H PRN Kirby Crigler, Mir M, MD       Or   ondansetron [facility]) injection 4 mg  4 mg Intravenous Q6H PRN Kirby Crigler, Mir M, MD       oxyCODONE (Oxy IR/ROXICODONE) immediate release tablet 2.5 mg  2.5 mg Oral Q4H PRN Rhetta Mura, MD   2.5 mg at 01/28/23 0720   potassium chloride (KLOR-CON) packet 40 mEq  40 mEq Oral Once Rhetta Mura, MD       senna-docusate (Senokot-S) tablet 1 tablet  1 tablet Oral QHS PRN Kirby Crigler, Mir M, MD       spironolactone (ALDACTONE) tablet 12.5 mg  12.5 mg Oral Daily Rhetta Mura, MD         Discharge Medications: Please see discharge summary for a list of discharge medications.  Relevant Imaging Results:  Relevant Lab Results:   Additional Information [id number]  Catalina Gravel, LCSW

## 2023-01-28 NOTE — TOC Initial Note (Addendum)
Transition of Care [facility]) - Initial/Assessment Note    Patient Details  Name: Leslie [name] MRN: [id number] Date of Birth: 04-27-1951  Transition of Care [facility]) CM/SW Contact:    Catalina Gravel, LCSW Phone Number: 01/28/2023, 4:06 PM  Clinical Narrative:                 Md stated pt may be DC ready tomorrow. PT recommends SNF.  CSW met with pt in room, pt sitting in chair daughter present.  Pt initially stated wants to return home with a home helper in place.  Further discussion with pt and daughter they opt for SNF for rehab.  Family would like options of facilities nearby, not previous facility.  CSW had also left message for another daughter regardign care plan, she returned call, also in agreement to plan and wanted to add [facility] to list of facilities. CSW started work up and requested SNF placement TOC to follow.        Expected Discharge Plan: Skilled Nursing Facility Barriers to Discharge: Continued Medical Work up   Patient Goals and CMS Choice Patient states their goals for this hospitalization and ongoing recovery are:: Return [place] after SNF   Choice offered to / list presented to : Patient      Expected Discharge Plan and Services In-house Referral: Clinical Social Work   Post Acute Care Choice: Skilled Nursing Facility Living arrangements for the past 2 months: Skilled Nursing Facility                                      Prior Living Arrangements/Services Living arrangements for the past 2 months: Skilled Nursing Facility Lives with:: Self Patient language and need for interpreter reviewed:: Yes Do you feel safe going back to the place where you live?: Yes      Need for Family Participation in Patient Care: Yes (Comment) (Daughter a support) Care giver support system in place?: Yes (comment) Current home services: Comptroller (Support person at home.) Criminal Activity/Legal Involvement Pertinent to Current Situation/Hospitalization: No - Comment as  needed  Activities of Daily Living Home Assistive Devices/Equipment: Environmental consultant (specify type), Wheelchair, Shower chair with back ADL Screening (condition at time of admission) Patient's cognitive ability adequate to safely complete daily activities?: Yes Is the patient deaf or have difficulty hearing?: No Does the patient have difficulty seeing, even when wearing glasses/contacts?: No Does the patient have difficulty concentrating, remembering, or making decisions?: No Patient able to express need for assistance with ADLs?: Yes Does the patient have difficulty dressing or bathing?: No Independently performs ADLs?: Yes (appropriate for developmental age) Does the patient have difficulty walking or climbing stairs?: Yes Weakness of Legs: Both Weakness of Arms/Hands: None  Permission Sought/Granted                  Emotional Assessment Appearance:: Appears stated age Attitude/Demeanor/Rapport: Gracious Affect (typically observed): Apprehensive Orientation: : Oriented to Self, Oriented to Place, Oriented to  Time, Oriented to Situation Alcohol / Substance Use: Not Applicable Psych Involvement: No (comment)  Admission diagnosis:  Acetaminophen overdose [T39.1X1A] Acetaminophen overdose, undetermined intent, initial encounter [T39.1X4A] Closed fracture of sacrum with routine healing, unspecified portion of sacrum, subsequent encounter [S32.10XD] Tylenol overdose [T39.1X1A] Patient Active Problem List   Diagnosis Date Noted   Tylenol overdose 01/28/2023   Acetaminophen overdose 01/26/2023   Malnutrition of moderate degree 01/08/2023   Sacral fracture (HCC) 01/02/2023  Chronic systolic CHF (congestive heart failure) (HCC) 01/02/2023   AKI (acute kidney injury) (HCC) 01/02/2023   S/P gastric bypass 01/02/2023   Renal insufficiency 09/19/2022   Acute CHF (congestive heart failure) (HCC) 08/07/2022   Status post right knee replacement 05/04/2021   Vitamin B12 deficiency 03/14/2021    Macrocytosis without anemia 03/14/2021   Vitamin D deficiency 02/17/2020   Sleep disorder 07/10/2018   ADHD (attention deficit hyperactivity disorder) 10/01/2017   Unilateral primary osteoarthritis, right knee 08/20/2017   Chronic pain of both shoulders 08/20/2017   Complete tear of right rotator cuff 04/29/2017   Osteoarthritis of left knee 06/16/2015   Status post total left knee replacement 06/16/2015   Attention deficit disorder 09/14/2007   COLONIC POLYPS, HX OF 08/03/2007   Essential hypertension 01/20/2007   OBESITY NOS 12/04/2006   Allergic rhinitis 12/04/2006   GASTROJEJUNOSTOMY, HX OF 12/04/2006   BREAST BIOPSY, HX OF 12/04/2006   PCP:  Philip Aspen, Limmie Patricia, MD Pharmacy:   [facility] [id number] [place], [place] - [address] AT [facility] OF [address] & [facility] [address] [place] [address] Phone: [phone] Fax: [phone]  [facility] [phone] - [place], [place] - [address] AT [facility] OF [address] [place] [address] Phone: [phone] Fax: [phone]     Social Determinants of Health (SDOH) Social History: SDOH Screenings   Food Insecurity: No Food Insecurity (01/26/2023)  Housing: Patient Declined (01/26/2023)  Transportation Needs: No Transportation Needs (01/26/2023)  Utilities: Not At Risk (01/26/2023)  Alcohol Screen: Low Risk  (09/20/2022)  Depression (PHQ2-9): Low Risk  (06/17/2022)  Financial Resource Strain: Low Risk  (09/20/2022)  Physical Activity: Unknown (10/04/2021)  Social Connections: Unknown (10/04/2021)  Tobacco Use: Medium Risk (01/26/2023)   SDOH Interventions:     Readmission Risk Interventions     No data to display

## 2023-01-28 NOTE — Progress Notes (Signed)
HOSPITALIST ROUNDING NOTE MAURIYAH [name] ZOX:[id number]  DOB: Sep 17, 1950  DOA: 01/26/2023  PCP: Leslie [name], Leslie Patricia, MD  01/28/2023,3:27 PM   LOS: 0 days      Code Status: full    From:  home    Current Dispo: unclear     72 year old white female Previous diagnosis ADD OA knee hypertension Single kidney donation 2004  Admission 2/29 through 10/09/2022 MAT treated by Dr. Heloise Beecham EF 25-30% previously with echocardiogram showing atrial flutter on anticoagulation--hospitalization complicated by mild AKI-GDMT was contemplated and patient was discharged Rehospitalized 01/02/2023 and fell down had worsening pain also had mildly displaced/nondisplaced sacral insufficiency fractures along with severe spinal stenosis---Underwent sacroplasty 01/07/23 placed on lidocaine patch Norco fentanyl patch and discharged to [facility] skilled ---   discharged from rehab 01/21/2023 and came to ED 7/7 as she did not have adequate pain control and had been taking Tylenol around-the-clock   LFT AST/ALT---133-112  alk phos 380, bicarb 18, sodium 127, BUN/creatinine  17/0.9   Poison control consulted secondary to elevated liver enzymes --placed on Mucomyst  Plan  Accidental Tylenol OD--prior moderate use of EtOH prior to admission 6/13 Per poison control Mucomyst DC 7/7--- no Tylenol in the future  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 Last hospitalization received B12-B12 level is quite elevated higher dose of gabapentin 300 twice daily seems beneficial-can be titrated in the next day or so for neuropathy relief Outpatient neurosurgeon to reevaluate   Sacral insufficiency fractures status post sacroplasty 01/07/2023 Cut back oxycodone to 2.5 every 4 as needed  Continue 25 mcg of fentanyl patch for the time being and needs to be cut back and then next 5 to 10 days to half of this as she improves mobility-I have discussed this with both her and her family at the bedside Therapy is  upgraded the recommendations to skilled rehab and patient will be faxed out  Paroxysmal A-fib/a flutter CHADVASC >4-planned A-fib ablation 03/2023? Continue apixaban 5 twice daily, amiodarone 200 daily, Toprol-XL 50 daily-remains in sinus rhythm currently and will need outpatient cardiology follow-up  Htn Combined systolic diastolic heart failure last EF 45% 12/19/2022 Cont losartan 12.5 every day Resume Aldactone 12.5, Lasix 20  Mild hypokalemia-replace with potassium x 1-check a.m. magnesium  Euvolemic hyponatremia-osmolality 263 Resume Lasix resume Aldactone as above which should help with both hyponatremia and hypokalemia  Depression/anxiety Continue Prozac 40 every morning, Xanax 0.25 at bedtime as needed As an outpatient would probably resume Adderall-would hold at this time  S/p GOP ADHD   DVT prophylaxis: Apixaban  Status is: Observation The patient will require care spanning > 2 midnights and should be moved to inpatient because:   Requires increasing mobility etc.    Subjective:  Nursing reports needed to be aroused this morning and was he is sleeping rather heavily When I walk and she is also little bit sleepy We had a productive discussion about pain control-I have let her know that I am not really comfortable prescribing relatively high dose of oxycodone but instead we can use gabapentin for neuropathic pain which is the predominant issue--I have told her that she probably needs follow-up with her neurosurgeon for definitive management I spoke to the family on the phone but later was informed by therapy that she required a whole lot more assistance and would qualify for skilled She has no chest pain no fever she ate about half of her meal she has no nausea no vomiting     Objective + exam Vitals:  01/27/23 1343 01/27/23 2023 01/28/23 0514 01/28/23 1310  BP: (!) 92/53 135/77 128/65 110/66  Pulse: 66 65 (!) 57 (!) 50  Resp: 18 20 18 16   Temp: 98 F (36.7 C)  98.6 F (37 C) 97.7 F (36.5 C) (!) 97.4 F (36.3 C)  TempSrc: Oral Oral Oral Oral  SpO2: 100% 100% 100% 99%  Weight:      Height:       Filed Weights   01/26/23 0112  Weight: 85.3 kg    Examination:  Eomi ncat no ict Some mild tremor--cta b no added sound no wheeze rales rhonchi S1-S2 no murmur no rub no gallop Abd soft no rebound Able to SLR, able to sit up with minimal assistance in the bed Power 5/5    Data Reviewed: reviewed   CBC    Component Value Date/Time   WBC 5.5 01/28/2023 0445   RBC 3.18 (L) 01/28/2023 0445   HGB 10.4 (L) 01/28/2023 0445   HGB 10.6 (L) 09/23/2022 1631   HCT 32.0 (L) 01/28/2023 0445   HCT 32.1 (L) 09/23/2022 1631   PLT 225 01/28/2023 0445   PLT 310 09/23/2022 1631   MCV 100.6 (H) 01/28/2023 0445   MCV 99 (H) 09/23/2022 1631   MCH 32.7 01/28/2023 0445   MCHC 32.5 01/28/2023 0445   RDW 12.0 01/28/2023 0445   RDW 11.8 09/23/2022 1631   LYMPHSABS 0.7 01/28/2023 0445   MONOABS 0.5 01/28/2023 0445   EOSABS 0.2 01/28/2023 0445   BASOSABS 0.1 01/28/2023 0445      Latest Ref Rng & Units 01/28/2023    4:45 AM 01/27/2023    3:18 AM 01/26/2023    7:28 PM  CMP  Glucose 70 - 99 mg/dL 88  86  454   BUN 8 - 23 mg/dL 11  11  13    Creatinine 0.44 - 1.00 mg/dL 0.98  1.19  1.47   Sodium 135 - 145 mmol/L 127  130  128   Potassium 3.5 - 5.1 mmol/L 3.3  4.2  3.3   Chloride 98 - 111 mmol/L 100  98  97   CO2 22 - 32 mmol/L 19  20  17    Calcium 8.9 - 10.3 mg/dL 8.8  9.4  9.2   Total Protein 6.5 - 8.1 g/dL 5.8  6.4  6.6   Total Bilirubin 0.3 - 1.2 mg/dL 0.7  0.5  0.7   Alkaline Phos 38 - 126 U/L 246  323  370   AST 15 - 41 U/L 32  59  83   ALT 0 - 44 U/L 59  90  109      Scheduled Meds:  amiodarone  200 mg Oral Daily   apixaban  5 mg Oral BID   dapagliflozin propanediol  10 mg Oral QAC breakfast   fentaNYL  1 patch Transdermal Q72H   FLUoxetine  40 mg Oral q AM   furosemide  20 mg Oral Daily   gabapentin  300 mg Oral BID   lamoTRIgine  100 mg  Oral BID   losartan  12.5 mg Oral Daily   melatonin  3 mg Oral QHS   metoprolol succinate  50 mg Oral Daily   spironolactone  12.5 mg Oral Daily   Continuous Infusions:  sodium chloride 50 mL/hr at 01/27/23 1400    Time 36  Rhetta Mura, MD  [facility]

## 2023-01-28 NOTE — Progress Notes (Signed)
Pt transferred to 5E from 4W.

## 2023-01-29 DIAGNOSIS — T391X1A Poisoning by 4-Aminophenol derivatives, accidental (unintentional), initial encounter: Secondary | ICD-10-CM | POA: Diagnosis not present

## 2023-01-29 LAB — COMPREHENSIVE METABOLIC PANEL
ALT: 51 U/L — ABNORMAL HIGH (ref 0–44)
AST: 28 U/L (ref 15–41)
Albumin: 3 g/dL — ABNORMAL LOW (ref 3.5–5.0)
Alkaline Phosphatase: 230 U/L — ABNORMAL HIGH (ref 38–126)
Anion gap: 6 (ref 5–15)
BUN: 12 mg/dL (ref 8–23)
CO2: 21 mmol/L — ABNORMAL LOW (ref 22–32)
Calcium: 8.9 mg/dL (ref 8.9–10.3)
Chloride: 107 mmol/L (ref 98–111)
Creatinine, Ser: 1.03 mg/dL — ABNORMAL HIGH (ref 0.44–1.00)
GFR, Estimated: 58 mL/min — ABNORMAL LOW (ref >60)
Glucose, Bld: 101 mg/dL — ABNORMAL HIGH (ref 70–99)
Potassium: 3.9 mmol/L (ref 3.5–5.1)
Sodium: 134 mmol/L — ABNORMAL LOW (ref 135–145)
Total Bilirubin: 0.5 mg/dL (ref 0.3–1.2)
Total Protein: 5.9 g/dL — ABNORMAL LOW (ref 6.5–8.1)

## 2023-01-29 LAB — CBC WITH DIFFERENTIAL/PLATELET
Abs Immature Granulocytes: 0.03 10*3/uL (ref 0.00–0.07)
Basophils Absolute: 0 10*3/uL (ref 0.0–0.1)
Basophils Relative: 1 %
Eosinophils Absolute: 0.2 10*3/uL (ref 0.0–0.5)
Eosinophils Relative: 5 %
HCT: 29.2 % — ABNORMAL LOW (ref 36.0–46.0)
Hemoglobin: 9.2 g/dL — ABNORMAL LOW (ref 12.0–15.0)
Immature Granulocytes: 1 %
Lymphocytes Relative: 14 %
Lymphs Abs: 0.7 10*3/uL (ref 0.7–4.0)
MCH: 32.4 pg (ref 26.0–34.0)
MCHC: 31.5 g/dL (ref 30.0–36.0)
MCV: 102.8 fL — ABNORMAL HIGH (ref 80.0–100.0)
Monocytes Absolute: 0.4 10*3/uL (ref 0.1–1.0)
Monocytes Relative: 9 %
Neutro Abs: 3.4 10*3/uL (ref 1.7–7.7)
Neutrophils Relative %: 70 %
Platelets: 207 10*3/uL (ref 150–400)
RBC: 2.84 MIL/uL — ABNORMAL LOW (ref 3.87–5.11)
RDW: 12.4 % (ref 11.5–15.5)
WBC: 4.8 10*3/uL (ref 4.0–10.5)
nRBC: 0 % (ref 0.0–0.2)

## 2023-01-29 NOTE — Progress Notes (Signed)
PROGRESS NOTE    Leslie [name]  ZOX:[id number] DOB: Mar 27, 1951 DOA: 01/26/2023 PCP: Philip Aspen, Limmie Patricia, MD   Brief Narrative:  72 y.o. female with medical history significant of HFrEF with EF of 45%, atrial fibrillation on Eliquis, hypertension, obesity s/p gastric bypass, s/p nephrectomy and recent hospitalization from 01/02/2023-01/09/2023 for fall and severe lower back pain resulting in sacral fracture for which she underwent fluoroscopy guided bilateral sacroplasty on 01/07/2023 by IR and was subsequently discharged to SNF on 01/09/2023.  She was subsequently discharged from SNF on 01/21/2023.  She presented to the ED on 01/26/2023 with worsening lower back pain and was found to have elevated LFTs.  Poison control consulted secondary to elevated liver enzymes: Was treated with Mucomyst.  PT subsequently recommended SNF placement.  TOC consulted.  Assessment & Plan:   Accidental Tylenol overdose -Was treated with Mucomyst with poison control and discontinued subsequently as per poison control recommendations.  Elevated LFTs -Possibly from above.  Improving.  Monitor intermittently.  Vitamin B12 deficiency -Diagnosed during last admission treated with B12 shots.  B12 level more than 7500 during this hospitalization.  Will hold off on supplementation at this time.  Severe lower back pain Sacral insufficiency fracture status post recent sacroplasty on 01/07/2023 Physical deconditioning -Currently on fentanyl patch, oral gabapentin and as needed oxycodone.  Oxycodone dose is being reduced.  Might have to go down on the dose of fentanyl patch as well. -PT recommending SNF placement.  TOC following.  Moderate to severe lumbar spinal stenosis -During last hospitalization,  MRI of lumbar spine showed severe L5-S1 spinal stenosis with moderate L4-L5 spinal stenosis and mild to moderate L3-L4 spinal stenosis. -Neurosurgery had recommended outpatient follow-up   Paroxysmal A-fib Continue  amiodarone and Eliquis  Chronic systolic CHF Essential hypertension Hyperlipidemia -Compensated.  Strict input and output.  Daily weights.  Fluid restriction.  Outpatient follow-up with heart failure team.  Continue Lasix, Farxiga, losartan, spironolactone and metoprolol succinate. -Statin on hold.  Hyponatremia -Mild.  Monitor intermittently  Hypokalemia Improved  Anemia of chronic disease Macrocytosis -Hemoglobin stable.  Monitor intermittently.  Obesity -Outpatient follow-up  Depression/anxiety -Continue fluoxetine and as needed Xanax at bedtime.  Adderall on hold.  DVT prophylaxis: Eliquis Code Status: Full Family Communication: None at bedside Disposition Plan: Status is: Inpatient Remains inpatient appropriate because: Of severity of illness.  Need for SNF placement.  Currently medically stable for discharge to SNF.  Consultants: None  Procedures: None  Antimicrobials: None   Subjective: Patient seen and examined at bedside.  Denies worsening back pain, fever or vomiting.  Objective: Vitals:   01/28/23 2252 01/29/23 0319 01/29/23 0652 01/29/23 1114  BP: (!) 106/51 (!) 102/46 122/60 (!) 105/58  Pulse: 71 (!) 57 (!) 58 (!) 54  Resp: 16 16 16 18   Temp: 97.6 F (36.4 C) 97.9 F (36.6 C) 97.6 F (36.4 C) (!) 97.5 F (36.4 C)  TempSrc:   Oral Oral  SpO2: (!) 72% 99% 98% 100%  Weight:      Height:        Intake/Output Summary (Last 24 hours) at 01/29/2023 1352 Last data filed at 01/29/2023 8119 Gross per 24 hour  Intake 1159.51 ml  Output 675 ml  Net 484.51 ml   Filed Weights   01/26/23 0112  Weight: 85.3 kg    Examination:  General exam: Appears calm and comfortable.  Looks chronically ill and deconditioned.  On room air.  Slow to respond.  Poor historian. Respiratory system: Bilateral decreased breath sounds  at bases Cardiovascular system: S1 & S2 heard, Rate controlled Gastrointestinal system: Abdomen is nondistended, soft and nontender.  Normal bowel sounds heard. Extremities: No cyanosis, clubbing, edema   Data Reviewed: I have personally reviewed following labs and imaging studies  CBC: Recent Labs  Lab 01/26/23 0323 01/27/23 0318 01/28/23 0445 01/29/23 0601  WBC 5.7 7.1 5.5 4.8  NEUTROABS 4.3  --  3.9 3.4  HGB 11.5* 11.7* 10.4* 9.2*  HCT 36.4 36.3 32.0* 29.2*  MCV 99.5 98.1 100.6* 102.8*  PLT 287 289 225 207   Basic Metabolic Panel: Recent Labs  Lab 01/26/23 0323 01/26/23 1928 01/27/23 0318 01/28/23 0445 01/29/23 0601  NA 127* 128* 130* 127* 134*  K 3.7 3.3* 4.2 3.3* 3.9  CL 99 97* 98 100 107  CO2 18* 17* 20* 19* 21*  GLUCOSE 99 100* 86 88 101*  BUN 17 13 11 11 12   CREATININE 0.98 0.77 0.77 0.86 1.03*  CALCIUM 9.2 9.2 9.4 8.8* 8.9   GFR: Estimated Creatinine Clearance: 52.1 mL/min (A) (by C-G formula based on SCr of 1.03 mg/dL (H)). Liver Function Tests: Recent Labs  Lab 01/26/23 0323 01/26/23 1928 01/27/23 0318 01/28/23 0445 01/29/23 0601  AST 133* 83* 59* 32 28  ALT 112* 109* 90* 59* 51*  ALKPHOS 380* 370* 323* 246* 230*  BILITOT 0.6 0.7 0.5 0.7 0.5  PROT 6.6 6.6 6.4* 5.8* 5.9*  ALBUMIN 3.6 3.4* 3.3* 3.1* 3.0*   No results for input(s): "LIPASE", "AMYLASE" in the last 168 hours. No results for input(s): "AMMONIA" in the last 168 hours. Coagulation Profile: Recent Labs  Lab 01/26/23 0938  INR 1.4*   Cardiac Enzymes: Recent Labs  Lab 01/26/23 0323  CKTOTAL 57   BNP (last 3 results) Recent Labs    08/22/22 1125 08/30/22 1335 09/12/22 1437  PROBNP 6,931* 3,625* 1,775*   HbA1C: No results for input(s): "HGBA1C" in the last 72 hours. CBG: No results for input(s): "GLUCAP" in the last 168 hours. Lipid Profile: No results for input(s): "CHOL", "HDL", "LDLCALC", "TRIG", "CHOLHDL", "LDLDIRECT" in the last 72 hours. Thyroid Function Tests: No results for input(s): "TSH", "T4TOTAL", "FREET4", "T3FREE", "THYROIDAB" in the last 72 hours. Anemia Panel: Recent Labs     01/28/23 0445  VITAMINB12 >7,500*   Sepsis Labs: No results for input(s): "PROCALCITON", "LATICACIDVEN" in the last 168 hours.  No results found for this or any previous visit (from the past 240 hour(s)).       Radiology Studies: No results found.      Scheduled Meds:  amiodarone  200 mg Oral Daily   apixaban  5 mg Oral BID   dapagliflozin propanediol  10 mg Oral QAC breakfast   fentaNYL  1 patch Transdermal Q72H   FLUoxetine  40 mg Oral q AM   furosemide  20 mg Oral Daily   gabapentin  300 mg Oral BID   lamoTRIgine  100 mg Oral BID   losartan  12.5 mg Oral Daily   melatonin  3 mg Oral QHS   metoprolol succinate  50 mg Oral Daily   spironolactone  12.5 mg Oral Daily   Continuous Infusions:        Glade Lloyd, MD [facility] 01/29/2023, 1:52 PM

## 2023-01-29 NOTE — TOC Progression Note (Signed)
Transition of Care [facility]) - Progression Note    Patient Details  Name: Leslie [name] MRN: [id number] Date of Birth: 07-23-1950  Transition of Care [facility]) CM/SW Contact  Otelia Santee, LCSW Phone Number: 01/29/2023, 1:59 PM  Clinical Narrative:    Met with pt at bedside to review bed offers. Pt accepted bed offer for SNF at [facility]. Insurance authorization has been requested and currently pending approval.    Expected Discharge Plan: Skilled Nursing Facility Barriers to Discharge: Continued Medical Work up  Expected Discharge Plan and Services In-house Referral: Clinical Social Work   Post Acute Care Choice: Skilled Nursing Facility Living arrangements for the past 2 months: Skilled Nursing Facility                                       Social Determinants of Health (SDOH) Interventions SDOH Screenings   Food Insecurity: No Food Insecurity (01/26/2023)  Housing: Patient Declined (01/26/2023)  Transportation Needs: No Transportation Needs (01/26/2023)  Utilities: Not At Risk (01/26/2023)  Alcohol Screen: Low Risk  (09/20/2022)  Depression (PHQ2-9): Low Risk  (06/17/2022)  Financial Resource Strain: Low Risk  (09/20/2022)  Physical Activity: Unknown (10/04/2021)  Social Connections: Unknown (10/04/2021)  Tobacco Use: Medium Risk (01/26/2023)    Readmission Risk Interventions     No data to display

## 2023-01-30 ENCOUNTER — Other Ambulatory Visit (HOSPITAL_COMMUNITY): Payer: Self-pay | Admitting: Cardiology

## 2023-01-30 DIAGNOSIS — Z7401 Bed confinement status: Secondary | ICD-10-CM | POA: Diagnosis not present

## 2023-01-30 DIAGNOSIS — I480 Paroxysmal atrial fibrillation: Secondary | ICD-10-CM | POA: Diagnosis not present

## 2023-01-30 DIAGNOSIS — S3210XD Unspecified fracture of sacrum, subsequent encounter for fracture with routine healing: Secondary | ICD-10-CM | POA: Diagnosis not present

## 2023-01-30 DIAGNOSIS — R296 Repeated falls: Secondary | ICD-10-CM | POA: Diagnosis not present

## 2023-01-30 DIAGNOSIS — T887XXA Unspecified adverse effect of drug or medicament, initial encounter: Secondary | ICD-10-CM | POA: Diagnosis not present

## 2023-01-30 DIAGNOSIS — M6259 Muscle wasting and atrophy, not elsewhere classified, multiple sites: Secondary | ICD-10-CM | POA: Diagnosis not present

## 2023-01-30 DIAGNOSIS — Z8781 Personal history of (healed) traumatic fracture: Secondary | ICD-10-CM | POA: Diagnosis not present

## 2023-01-30 DIAGNOSIS — T391X1A Poisoning by 4-Aminophenol derivatives, accidental (unintentional), initial encounter: Secondary | ICD-10-CM | POA: Diagnosis not present

## 2023-01-30 DIAGNOSIS — E876 Hypokalemia: Secondary | ICD-10-CM | POA: Diagnosis not present

## 2023-01-30 DIAGNOSIS — M6281 Muscle weakness (generalized): Secondary | ICD-10-CM | POA: Diagnosis not present

## 2023-01-30 DIAGNOSIS — R278 Other lack of coordination: Secondary | ICD-10-CM | POA: Diagnosis not present

## 2023-01-30 DIAGNOSIS — G8911 Acute pain due to trauma: Secondary | ICD-10-CM | POA: Diagnosis not present

## 2023-01-30 DIAGNOSIS — R262 Difficulty in walking, not elsewhere classified: Secondary | ICD-10-CM | POA: Diagnosis not present

## 2023-01-30 DIAGNOSIS — D638 Anemia in other chronic diseases classified elsewhere: Secondary | ICD-10-CM | POA: Diagnosis not present

## 2023-01-30 DIAGNOSIS — R7401 Elevation of levels of liver transaminase levels: Secondary | ICD-10-CM | POA: Diagnosis not present

## 2023-01-30 DIAGNOSIS — I5022 Chronic systolic (congestive) heart failure: Secondary | ICD-10-CM | POA: Diagnosis not present

## 2023-01-30 DIAGNOSIS — R531 Weakness: Secondary | ICD-10-CM | POA: Diagnosis not present

## 2023-01-30 DIAGNOSIS — E871 Hypo-osmolality and hyponatremia: Secondary | ICD-10-CM | POA: Diagnosis not present

## 2023-01-30 DIAGNOSIS — M48061 Spinal stenosis, lumbar region without neurogenic claudication: Secondary | ICD-10-CM | POA: Diagnosis not present

## 2023-01-30 DIAGNOSIS — I10 Essential (primary) hypertension: Secondary | ICD-10-CM | POA: Diagnosis not present

## 2023-01-30 LAB — CBC WITH DIFFERENTIAL/PLATELET
Abs Immature Granulocytes: 0.03 10*3/uL (ref 0.00–0.07)
Basophils Absolute: 0.1 10*3/uL (ref 0.0–0.1)
Basophils Relative: 1 %
Eosinophils Absolute: 0.3 10*3/uL (ref 0.0–0.5)
Eosinophils Relative: 5 %
HCT: 33.2 % — ABNORMAL LOW (ref 36.0–46.0)
Hemoglobin: 10.4 g/dL — ABNORMAL LOW (ref 12.0–15.0)
Immature Granulocytes: 1 %
Lymphocytes Relative: 16 %
Lymphs Abs: 0.9 10*3/uL (ref 0.7–4.0)
MCH: 31.9 pg (ref 26.0–34.0)
MCHC: 31.3 g/dL (ref 30.0–36.0)
MCV: 101.8 fL — ABNORMAL HIGH (ref 80.0–100.0)
Monocytes Absolute: 0.4 10*3/uL (ref 0.1–1.0)
Monocytes Relative: 7 %
Neutro Abs: 3.9 10*3/uL (ref 1.7–7.7)
Neutrophils Relative %: 70 %
Platelets: 248 10*3/uL (ref 150–400)
RBC: 3.26 MIL/uL — ABNORMAL LOW (ref 3.87–5.11)
RDW: 12.3 % (ref 11.5–15.5)
WBC: 5.6 10*3/uL (ref 4.0–10.5)
nRBC: 0 % (ref 0.0–0.2)

## 2023-01-30 LAB — COMPREHENSIVE METABOLIC PANEL
ALT: 45 U/L — ABNORMAL HIGH (ref 0–44)
AST: 25 U/L (ref 15–41)
Albumin: 3 g/dL — ABNORMAL LOW (ref 3.5–5.0)
Alkaline Phosphatase: 215 U/L — ABNORMAL HIGH (ref 38–126)
Anion gap: 8 (ref 5–15)
BUN: 15 mg/dL (ref 8–23)
CO2: 22 mmol/L (ref 22–32)
Calcium: 8.7 mg/dL — ABNORMAL LOW (ref 8.9–10.3)
Chloride: 102 mmol/L (ref 98–111)
Creatinine, Ser: 1.04 mg/dL — ABNORMAL HIGH (ref 0.44–1.00)
GFR, Estimated: 57 mL/min — ABNORMAL LOW (ref >60)
Glucose, Bld: 101 mg/dL — ABNORMAL HIGH (ref 70–99)
Potassium: 3.9 mmol/L (ref 3.5–5.1)
Sodium: 132 mmol/L — ABNORMAL LOW (ref 135–145)
Total Bilirubin: 0.4 mg/dL (ref 0.3–1.2)
Total Protein: 5.8 g/dL — ABNORMAL LOW (ref 6.5–8.1)

## 2023-01-30 MED ORDER — FENTANYL 12 MCG/HR TD PT72: 1.0000 | MEDICATED_PATCH | TRANSDERMAL | 0 refills | Status: DC

## 2023-01-30 MED ORDER — OXYCODONE HCL 5 MG PO TABS: 2.5000 mg | ORAL_TABLET | ORAL | 0 refills | Status: DC | PRN

## 2023-01-30 MED ORDER — ALPRAZOLAM 0.25 MG PO TABS
0.2500 mg | ORAL_TABLET | Freq: Every evening | ORAL | 0 refills | Status: DC | PRN
Start: 2023-01-30 — End: 2023-02-13

## 2023-01-30 MED ORDER — GABAPENTIN 300 MG PO CAPS: 300.0000 mg | ORAL_CAPSULE | Freq: Two times a day (BID) | ORAL | 0 refills | Status: DC

## 2023-01-30 NOTE — Plan of Care (Signed)

## 2023-01-30 NOTE — Progress Notes (Signed)
PROGRESS NOTE    Leslie [name]  GNF:[id number] DOB: 02-Nov-1950 DOA: 01/26/2023 PCP: Philip Aspen, Limmie Patricia, MD   Brief Narrative:  72 y.o. female with medical history significant of HFrEF with EF of 45%, atrial fibrillation on Eliquis, hypertension, obesity s/p gastric bypass, s/p nephrectomy and recent hospitalization from 01/02/2023-01/09/2023 for fall and severe lower back pain resulting in sacral fracture for which she underwent fluoroscopy guided bilateral sacroplasty on 01/07/2023 by IR and was subsequently discharged to SNF on 01/09/2023.  She was subsequently discharged from SNF on 01/21/2023.  She presented to the ED on 01/26/2023 with worsening lower back pain and was found to have elevated LFTs.  Poison control consulted secondary to elevated liver enzymes: Was treated with Mucomyst.  PT subsequently recommended SNF placement.  TOC consulted.  Assessment & Plan:   Accidental Tylenol overdose -Was treated with Mucomyst with poison control and discontinued subsequently as per poison control recommendations.  Elevated LFTs -Possibly from above.  Improving.  Monitor intermittently.  Vitamin B12 deficiency -Diagnosed during last admission treated with B12 shots.  B12 level more than 7500 during this hospitalization.  Will hold off on supplementation at this time.  Severe lower back pain Sacral insufficiency fracture status post recent sacroplasty on 01/07/2023 Physical deconditioning -Currently on fentanyl patch, oral gabapentin and as needed oxycodone.  Oxycodone dose is being reduced.  Might have to go down on the dose of fentanyl patch as well. -PT recommending SNF placement.  TOC following.  Moderate to severe lumbar spinal stenosis -During last hospitalization,  MRI of lumbar spine showed severe L5-S1 spinal stenosis with moderate L4-L5 spinal stenosis and mild to moderate L3-L4 spinal stenosis. -Neurosurgery had recommended outpatient follow-up   Paroxysmal A-fib Continue  amiodarone and Eliquis  Chronic systolic CHF Essential hypertension Hyperlipidemia -Compensated.  Strict input and output.  Daily weights.  Fluid restriction.  Outpatient follow-up with heart failure team.  Continue Lasix, Farxiga, losartan, spironolactone and metoprolol succinate. -Statin on hold.  Hyponatremia -Mild.  Monitor intermittently  Hypokalemia -Improved  Anemia of chronic disease Macrocytosis -Hemoglobin stable.  Monitor intermittently.  Obesity -Outpatient follow-up  Depression/anxiety -Continue fluoxetine and as needed Xanax at bedtime.  Adderall on hold.  DVT prophylaxis: Eliquis Code Status: Full Family Communication: None at bedside Disposition Plan: Status is: Inpatient Remains inpatient appropriate because: Of severity of illness.  Need for SNF placement.  Currently medically stable for discharge to SNF.  Consultants: None  Procedures: None  Antimicrobials: None   Subjective: Patient seen and examined at bedside.  No fever, worsening shortness of breath, vomiting.  Objective: Vitals:   01/29/23 0652 01/29/23 1114 01/29/23 2120 01/30/23 0452  BP: 122/60 (!) 105/58 (!) 100/54 116/84  Pulse: (!) 58 (!) 54 60 (!) 58  Resp: 16 18 16 15   Temp: 97.6 F (36.4 C) (!) 97.5 F (36.4 C) 98.2 F (36.8 C) 97.7 F (36.5 C)  TempSrc: Oral Oral Oral Oral  SpO2: 98% 100% 97% 95%  Weight:      Height:        Intake/Output Summary (Last 24 hours) at 01/30/2023 1023 Last data filed at 01/30/2023 0900 Gross per 24 hour  Intake 8 ml  Output 300 ml  Net -292 ml   Filed Weights   01/26/23 0112  Weight: 85.3 kg    Examination:  General: Currently on room air.  No distress.  Chronically ill and deconditioned looking.  Slow to respond. respiratory: Decreased breath sounds at bases bilaterally with some crackles CVS: Mild intermittent  bradycardia present; S1-S2 heard  abdominal: Soft, nontender, slightly distended, no organomegaly; normal bowel sounds  are heard  extremities: Trace lower extremity edema; no clubbing.     Data Reviewed: I have personally reviewed following labs and imaging studies  CBC: Recent Labs  Lab 01/26/23 0323 01/27/23 0318 01/28/23 0445 01/29/23 0601 01/30/23 0536  WBC 5.7 7.1 5.5 4.8 5.6  NEUTROABS 4.3  --  3.9 3.4 3.9  HGB 11.5* 11.7* 10.4* 9.2* 10.4*  HCT 36.4 36.3 32.0* 29.2* 33.2*  MCV 99.5 98.1 100.6* 102.8* 101.8*  PLT 287 289 225 207 248   Basic Metabolic Panel: Recent Labs  Lab 01/26/23 1928 01/27/23 0318 01/28/23 0445 01/29/23 0601 01/30/23 0536  NA 128* 130* 127* 134* 132*  K 3.3* 4.2 3.3* 3.9 3.9  CL 97* 98 100 107 102  CO2 17* 20* 19* 21* 22  GLUCOSE 100* 86 88 101* 101*  BUN 13 11 11 12 15   CREATININE 0.77 0.77 0.86 1.03* 1.04*  CALCIUM 9.2 9.4 8.8* 8.9 8.7*   GFR: Estimated Creatinine Clearance: 51.6 mL/min (A) (by C-G formula based on SCr of 1.04 mg/dL (H)). Liver Function Tests: Recent Labs  Lab 01/26/23 1928 01/27/23 0318 01/28/23 0445 01/29/23 0601 01/30/23 0536  AST 83* 59* 32 28 25  ALT 109* 90* 59* 51* 45*  ALKPHOS 370* 323* 246* 230* 215*  BILITOT 0.7 0.5 0.7 0.5 0.4  PROT 6.6 6.4* 5.8* 5.9* 5.8*  ALBUMIN 3.4* 3.3* 3.1* 3.0* 3.0*   No results for input(s): "LIPASE", "AMYLASE" in the last 168 hours. No results for input(s): "AMMONIA" in the last 168 hours. Coagulation Profile: Recent Labs  Lab 01/26/23 0938  INR 1.4*   Cardiac Enzymes: Recent Labs  Lab 01/26/23 0323  CKTOTAL 57   BNP (last 3 results) Recent Labs    08/22/22 1125 08/30/22 1335 09/12/22 1437  PROBNP 6,931* 3,625* 1,775*   HbA1C: No results for input(s): "HGBA1C" in the last 72 hours. CBG: No results for input(s): "GLUCAP" in the last 168 hours. Lipid Profile: No results for input(s): "CHOL", "HDL", "LDLCALC", "TRIG", "CHOLHDL", "LDLDIRECT" in the last 72 hours. Thyroid Function Tests: No results for input(s): "TSH", "T4TOTAL", "FREET4", "T3FREE", "THYROIDAB" in the last  72 hours. Anemia Panel: Recent Labs    01/28/23 0445  VITAMINB12 >7,500*   Sepsis Labs: No results for input(s): "PROCALCITON", "LATICACIDVEN" in the last 168 hours.  No results found for this or any previous visit (from the past 240 hour(s)).       Radiology Studies: No results found.      Scheduled Meds:  amiodarone  200 mg Oral Daily   apixaban  5 mg Oral BID   dapagliflozin propanediol  10 mg Oral QAC breakfast   fentaNYL  1 patch Transdermal Q72H   FLUoxetine  40 mg Oral q AM   furosemide  20 mg Oral Daily   gabapentin  300 mg Oral BID   lamoTRIgine  100 mg Oral BID   losartan  12.5 mg Oral Daily   melatonin  3 mg Oral QHS   metoprolol succinate  50 mg Oral Daily   spironolactone  12.5 mg Oral Daily   Continuous Infusions:        Glade Lloyd, MD [facility] 01/30/2023, 10:23 AM

## 2023-01-30 NOTE — Discharge Summary (Signed)
Physician Discharge Summary  CIA GARRETSON ZOX:[id number] DOB: 1951-06-08 DOA: 01/26/2023  PCP: Philip Aspen, Limmie Patricia, MD  Admit date: 01/26/2023 Discharge date: 01/30/2023  Admitted From: Home Disposition: SNF  Recommendations for Outpatient Follow-up:  Follow up with SNF provider at earliest convenience Follow up in ED if symptoms worsen or new appear   Home Health: No Equipment/Devices: None  Discharge Condition: Stable CODE STATUS: Full Diet recommendation: Heart healthy  Brief/Interim Summary: 72 y.o. female with medical history significant of HFrEF with EF of 45%, atrial fibrillation on Eliquis, hypertension, obesity s/p gastric bypass, s/p nephrectomy and recent hospitalization from 01/02/2023-01/09/2023 for fall and severe lower back pain resulting in sacral fracture for which she underwent fluoroscopy guided bilateral sacroplasty on 01/07/2023 by IR and was subsequently discharged to SNF on 01/09/2023.  She was subsequently discharged from SNF on 01/21/2023.  She presented to the ED on 01/26/2023 with worsening lower back pain and was found to have elevated LFTs.  Poison control consulted secondary to elevated liver enzymes: Was treated with Mucomyst.  PT subsequently recommended SNF placement.  Currently medically stable for discharge.  Discharge to SNF once bed is available.    Discharge Diagnoses:   Accidental Tylenol overdose -Was treated with Mucomyst with poison control and discontinued subsequently as per poison control recommendations.   Elevated LFTs -Possibly from above.  Improving.  Monitor intermittently as an outpatient.   Vitamin B12 deficiency -Diagnosed during last admission treated with B12 shots.  B12 level more than 7500 during this hospitalization.  Will hold off on supplementation at this time.   Severe lower back pain Sacral insufficiency fracture status post recent sacroplasty on 01/07/2023 Physical deconditioning -Currently on fentanyl patch, oral  gabapentin and as needed oxycodone.  Oxycodone dose is being reduced.  Might have to go down on the dose of fentanyl patch as well, will discharge on 12.5 mcg every 72 hours.  This possibly can be stopped gradually as an outpatient and may be switched to Lidoderm patch -PT recommending SNF placement. Currently medically stable for discharge.  Discharge to SNF once bed is available.   Moderate to severe lumbar spinal stenosis -During last hospitalization,  MRI of lumbar spine showed severe L5-S1 spinal stenosis with moderate L4-L5 spinal stenosis and mild to moderate L3-L4 spinal stenosis. -Neurosurgery had recommended outpatient follow-up    Paroxysmal A-fib Continue amiodarone and Eliquis   Chronic systolic CHF Essential hypertension Hyperlipidemia -Compensated.  Strict input and output.  Daily weights.  Fluid restriction.  Outpatient follow-up with heart failure team.  Continue  Farxiga, losartan, spironolactone and metoprolol succinate.  Continue as needed Lasix -Statin to remain on hold on discharge and can be resumed once LFTs remained stable as an outpatient  Hyponatremia -Mild.  Monitor intermittently as an outpatient   Hypokalemia -Improved   Anemia of chronic disease Macrocytosis -Hemoglobin stable.  Monitor intermittently as an outpatient.   Obesity -Outpatient follow-up   Depression/anxiety -Continue fluoxetine and as needed Xanax at bedtime.  Resume Adderall on discharge   Discharge Instructions  Discharge Instructions     Diet - low sodium heart healthy   Complete by: As directed    Increase activity slowly   Complete by: As directed       Allergies as of 01/30/2023       Reactions   Amoxicillin    Hives   Sulfamethoxazole Nausea And Vomiting   See patient list of med intolerances due to h/o gastric bypass and nephrectomy  Medication List     STOP taking these medications    acetaminophen 500 MG tablet Commonly known as: TYLENOL    atorvastatin 40 MG tablet Commonly known as: LIPITOR   fentaNYL 25 MCG/HR Commonly known as: DURAGESIC Replaced by: fentaNYL 12 MCG/HR   HYDROcodone-acetaminophen 5-325 MG tablet Commonly known as: NORCO/VICODIN   lidocaine 5 % Commonly known as: LIDODERM   zolpidem 10 MG tablet Commonly known as: AMBIEN       TAKE these medications    ALPRAZolam 0.25 MG tablet Commonly known as: XANAX Take 1 tablet (0.25 mg total) by mouth at bedtime as needed for anxiety. TAKE 1 TABLET(0.25 MG) BY MOUTH AT BEDTIME AS NEEDED FOR ANXIETY Strength: 0.25 mg   amiodarone 200 MG tablet Commonly known as: PACERONE Take 200 mg by mouth daily.   amphetamine-dextroamphetamine 30 MG 24 hr capsule Commonly known as: ADDERALL XR Take 1 capsule (30 mg total) by mouth every morning.   amphetamine-dextroamphetamine 20 MG tablet Commonly known as: Adderall Take 1 tablet (20 mg total) by mouth daily.   apixaban 5 MG Tabs tablet Commonly known as: ELIQUIS Take 1 tablet (5 mg total) by mouth 2 (two) times daily.   dapagliflozin propanediol 10 MG Tabs tablet Commonly known as: Farxiga Take 1 tablet (10 mg total) by mouth daily before breakfast.   fentaNYL 12 MCG/HR Commonly known as: DURAGESIC Place 1 patch onto the skin every 3 (three) days. Start taking on: January 31, 2023 Replaces: fentaNYL 25 MCG/HR   FLUoxetine 40 MG capsule Commonly known as: PROZAC TAKE 1 CAPSULE(40 MG) BY MOUTH EVERY MORNING What changed: See the new instructions.   furosemide 40 MG tablet Commonly known as: LASIX Take 20 mg by mouth as needed. Take one half (0.5) tablet by mouth ( 20 mg) as needed for wt gain of 3 lbs in 24 hours or 5 lbs in one week.   gabapentin 300 MG capsule Commonly known as: NEURONTIN Take 1 capsule (300 mg total) by mouth 2 (two) times daily. What changed:  medication strength how much to take   lamoTRIgine 100 MG tablet Commonly known as: LAMICTAL TAKE 1 TABLET(100 MG) BY MOUTH TWICE  DAILY What changed: See the new instructions.   losartan 25 MG tablet Commonly known as: COZAAR Take 0.5 tablets (12.5 mg total) by mouth daily.   melatonin 3 MG Tabs tablet Take 1 tablet (3 mg total) by mouth at bedtime.   methocarbamol 500 MG tablet Commonly known as: ROBAXIN Take 1 tablet (500 mg total) by mouth every 6 (six) hours as needed for muscle spasms.   metoprolol succinate 50 MG 24 hr tablet Commonly known as: TOPROL-XL Take 1 tablet (50 mg total) by mouth at bedtime. Take with or immediately following a meal.   oxyCODONE 5 MG immediate release tablet Commonly known as: Oxy IR/ROXICODONE Take 0.5 tablets (2.5 mg total) by mouth every 4 (four) hours as needed for moderate pain.   polyethylene glycol 17 g packet Commonly known as: MIRALAX / GLYCOLAX Take 17 g by mouth daily as needed.   potassium chloride 10 MEQ tablet Commonly known as: KLOR-CON M Take 10 mEq by mouth daily.   PreserVision AREDS 2 Caps Take 1 capsule by mouth 2 (two) times daily.   senna-docusate 8.6-50 MG tablet Commonly known as: Senokot-S Take 2 tablets by mouth 2 (two) times daily. What changed:  when to take this reasons to take this   spironolactone 25 MG tablet Commonly known as: Aldactone Take 0.5  tablets (12.5 mg total) by mouth daily.   Vitamin D (Ergocalciferol) 1.25 MG (50000 UNIT) Caps capsule Commonly known as: DRISDOL Take 1 capsule (50,000 Units total) by mouth every 7 (seven) days.        Allergies  Allergen Reactions   Amoxicillin     Hives   Sulfamethoxazole Nausea And Vomiting    See patient list of med intolerances due to h/o gastric bypass and nephrectomy    Consultations: None   Procedures/Studies: US Abdomen Limited RUQ (LIVER/GB)  Result Date: 01/26/2023 CLINICAL DATA:  Elevated LFTs. EXAM: ULTRASOUND ABDOMEN LIMITED RIGHT UPPER QUADRANT COMPARISON:  None Available. FINDINGS: Gallbladder: Status post cholecystectomy. Common bile duct: Diameter: 2.7  mm Liver: No focal lesion identified. Within normal limits in parenchymal echogenicity. Portal vein is patent on color Doppler imaging with normal direction of blood flow towards the liver. Other: Diminished exam detail due to body habitus, overlying bowel gas and patient motion. IMPRESSION: 1. Status post cholecystectomy.  No bile duct dilatation. 2. No acute abnormality. Electronically Signed   By: Signa Kell M.D.   On: 01/26/2023 05:52   CT PELVIS WO CONTRAST  Result Date: 01/26/2023 CLINICAL DATA:  Hip trauma with fracture suspected. Recent sacral fracture peer EXAM: CT PELVIS WITHOUT CONTRAST TECHNIQUE: Multidetector CT imaging of the pelvis was performed following the standard protocol without intravenous contrast. RADIATION DOSE REDUCTION: This exam was performed according to the departmental dose-optimization program which includes automated exposure control, adjustment of the mA and/or kV according to patient size and/or use of iterative reconstruction technique. COMPARISON:  Lumbar CT 01/04/2023 FINDINGS: Subacute sacral insufficiency fractures involving both ala and transversely across the S1-2 level. Anterior displacement at the S1-2 fracture plane measures 11 mm, increased from before when there was only slight cortical offset. There is fracture reabsorption along the bilateral ala components which have undergone interval cement augmentation. Bilateral L5 transverse process fractures. No obturator ring fractures. Both hips appear intact and located. Expected presacral edema. Dystrophic calcification in fat along the bilateral ischial tuberosities attributed to pressure IMPRESSION: 1. Subacute sacral insufficiency fractures with progressive displacement of the horizontal component at S1-2 when compared to 01/02/2023. Interval sacroplasty on both sides. 2. L5 bilateral transverse process fractures, interval on the right since 01/02/2023. Electronically Signed   By: Tiburcio Pea M.D.   On:  01/26/2023 04:26   CT Lumbar Spine Wo Contrast  Result Date: 01/26/2023 CLINICAL DATA:  Back trauma.  Recent sacral fracture. EXAM: CT LUMBAR SPINE WITHOUT CONTRAST TECHNIQUE: Multidetector CT imaging of the lumbar spine was performed without intravenous contrast administration. Multiplanar CT image reconstructions were also generated. RADIATION DOSE REDUCTION: This exam was performed according to the departmental dose-optimization program which includes automated exposure control, adjustment of the mA and/or kV according to patient size and/or use of iterative reconstruction technique. COMPARISON:  01/02/2023 lumbar MRI FINDINGS: Segmentation: 5 lumbar type vertebrae. Alignment: Mild degenerative anterolisthesis at L4-5 Vertebrae: Pronounced osteopenia. There are bilateral transverse process fractures at L5, interval on the right when compared to prior. No acute vertebral body fracture. Progressive sacral insufficiency fracture described on dedicated pelvis CT. Paraspinal and other soft tissues: Presacral edema related to the known fracture. Atherosclerosis and left nephrectomy. Disc levels: Lumbar spine degeneration especially affects facets at L3-4 and below with the L4-5 anterolisthesis described above. Spinal stenosis at L3-4 and L4-5, especially advanced at L4-5. The foramina appear patent. IMPRESSION: 1. L5 subacute bilateral transverse process fracture, new on the right since comparison 01/02/2023. 2. Sacral insufficiency fracture  described on dedicated pelvis CT reported separately. 3. Lumbar degeneration especially affecting facets with L4-5 anterolisthesis. Compressive spinal stenosis at L3-4 and especially L4-5. Electronically Signed   By: Tiburcio Pea M.D.   On: 01/26/2023 04:20   IR SACROPLASTY BILATERAL  Result Date: 01/07/2023 INDICATION: GALA PADOVANO is a 72 y.o. female, ex smoker, with past medical history significant for ADD, anemia, osteoarthritis, hypertension, heart failure,  atrial fibrillation on Eliquis, hypertension, hyperlipidemia , vitamin B12/D deficiency, obesity with prior gastric bypass 2003, prior nephrectomy who was admitted to [facility] on 6/13 following ground-level fall with subsequent severe lower back pain. Hip x-ray showed osteopenia and degenerative changes. CT of the lumbar spine without contrast showed acute mildly displaced fracture through the left S1 transverse process; acute nondisplaced fractures through the sacral alla bilaterally.Patient continues to have severe lower back/sacral pain despite IV/p.o. narcotics with radiation of pain down both lower extremities, greater on the left. Pain exacerbated by movement. She comes to our service today for bilateral sacroplasty. EXAM: FLUOROSCOPY GUIDED BILATERAL SACROPLASTY MEDICATIONS: As antibiotic prophylaxis, Vancomycin 1 g IV was ordered pre-procedure and administered intravenously within 1 hour of incision. All current medications are in the EMR and have been reviewed as part of this encounter. ANESTHESIA/SEDATION: Moderate (conscious) sedation was employed during this procedure. A total of Versed 2.5 mg and Fentanyl 125 mcg were administered intravenously for moderate conscious sedation monitored under my direct supervision. Total intraservice time of sedation was for 3 minutes. The patient's vital signs were monitored throughout the procedure and recorded in the patient's medical record by the radiology nurse. FLUOROSCOPY: Radiation Exposure Index (as provided by the fluoroscopic device): PSD 2,394 mGy Kerma COMPLICATIONS: None immediate. TECHNIQUE: Informed written consent was obtained from the patient after a thorough discussion of the procedural risks, benefits and alternatives. All questions were addressed. Maximal Sterile Barrier Technique was utilized including caps, mask, sterile gowns, sterile gloves, sterile drape, hand hygiene and skin antiseptic. A timeout was performed prior to the  initiation of the procedure. PROCEDURE: The patient was placed in prone position on the angiography table. The sacral region was prepped and draped in a sterile fashion. Under fluoroscopy, the right sacral ala was delineated and the skin area was marked. The skin was infiltrated with a 1% Lidocaine at the expected entry site. Using a 22-gauge spinal needle, the soft issue and periosteum were infiltrated with Bupivacaine 0.5%. A skin incision was made at the access site. Subsequently, an 11-gauge Kyphon trocar was inserted under fluoroscopic guidance until contact with the posterior S3 cortex. The trocar was inserted under light hammer tapping on in a longitudinal fashion along the sacral ala until S2 was reached. The diamond mandrill was removed. Under fluoroscopy, the left sacral ala was delineated and the skin area was marked. The skin was infiltrated with a 1% Lidocaine at the expected entry site. Using a 22-gauge spinal needle, the soft issue and periosteum were infiltrated with Bupivacaine 0.5%. A skin incision was made at the access site. Subsequently, an 11-gauge Kyphon trocar was inserted under fluoroscopic guidance until contact with the posterior S3 cortex. The trocar was inserted under light hammer tapping on in a longitudinal fashion along the sacral ala until S2 was reached. The diamond mandrill was removed. A bone drill was coaxially advanced to the S1 level on each side. Under continuous fluoroscopy guidance in the AP and lateral views, the bilateral sacral ala were filled with previously mixed polymethyl-methacrylate (PMMA) added to barium for opacification. Both cannulas  were later removed. Hemostasis was achieved at the skin entry site. The access sites were cleaned and covered with a sterile bandage. There were no acute complications. Patient tolerated the procedure well. The patient was transferred back to [facility] IMPRESSION: 1. Successful bilateral sacral plasty for treatment of  bilateral insufficiency/traumatic fractures with intractable pain. 2. If the patient has known osteoporosis, recommend treatment as clinically indicated. If the patient's bone density status is unknown, DEXA scan is recommended. Electronically Signed   By: Baldemar Lenis M.D.   On: 01/07/2023 16:41   MR LUMBAR SPINE WO CONTRAST  Result Date: 01/02/2023 CLINICAL DATA:  Fall, back pain, sacral fractures EXAM: MRI LUMBAR SPINE WITHOUT CONTRAST TECHNIQUE: Multiplanar, multisequence MR imaging of the lumbar spine was performed. No intravenous contrast was administered. COMPARISON:  01/02/2023 CT lumbar spine, 06/04/2021 MRI lumbar spine FINDINGS: Segmentation: In keeping with the same day CT lumbar spine and in correlation with the 08/07/2022 CT chest, there are 6 non rib-bearing vertebral bodies, the last of which is labeled S1, which is lumbarized. Alignment:  5 mm anterolisthesis of L5 on S1. Vertebrae: Increased T2 signal in the left sacral ala (series 6, image 13), and in the central sacrum (series 6, image 9), consistent with the fractures that are better seen on the same-day CT lumbar spine. No acute fracture in the lumbar spine. Conus medullaris and cauda equina: Conus extends to the L1-L2 level. Conus and cauda equina appear normal. Paraspinal and other soft tissues: Small renal cysts, for which no follow-up is currently indicated. The left kidney is likely surgically absent. No lymphadenopathy. Disc levels: T12-L1: Seen only on the sagittal images. No significant disc bulge, spinal canal stenosis, or neural foraminal narrowing. L1-L2: Minimal disc bulge. No spinal canal stenosis or neural foraminal narrowing. L2-L3: Minimal disc bulge. No spinal canal stenosis or neural foraminal narrowing. L3-L4: Mild disc bulge. Mild facet arthropathy. Ligamentum flavum hypertrophy. Narrowing of the lateral recesses. Mild to moderate spinal canal stenosis. Mild right neural foraminal narrowing. L4-L5:  Moderate disc bulge. Moderate facet arthropathy. Ligamentum flavum hypertrophy. Narrowing of the lateral recesses. Moderate spinal canal stenosis. No neural foraminal narrowing. L5-S1: Grade 1 anterolisthesis with disc unroofing. Moderate disc bulge. Severe facet arthropathy. Ligamentum flavum hypertrophy. Effacement of the lateral recesses. Severe spinal canal stenosis. No neural foraminal narrowing. S1-S2: Minimal disc bulge with right greater than left lateral disc osteophyte complex, which may contact the exiting right S1 nerve. Mild facet arthropathy. No spinal canal stenosis or neural foraminal narrowing. IMPRESSION: 1. Sacral fractures are better seen on the same-day CT lumbar spine. No acute fracture in the lumbar spine. 2. L5-S1 severe spinal canal stenosis. Effacement of the lateral recesses at this level likely compresses the descending S1 nerve roots. 3. L4-L5 moderate spinal canal stenosis. Narrowing of the lateral recesses at this level could affect the descending L5 nerve roots. 4. L3-L4 mild to moderate spinal canal stenosis and mild right neural foraminal narrowing. Narrowing of the lateral recesses at this level could affect the descending L4 nerve roots. Electronically Signed   By: Wiliam Ke M.D.   On: 01/02/2023 23:16   CT Lumbar Spine Wo Contrast  Result Date: 01/02/2023 CLINICAL DATA:  Low back pain EXAM: CT LUMBAR SPINE WITHOUT CONTRAST TECHNIQUE: Multidetector CT imaging of the lumbar spine was performed without intravenous contrast administration. Multiplanar CT image reconstructions were also generated. RADIATION DOSE REDUCTION: This exam was performed according to the departmental dose-optimization program which includes automated exposure control, adjustment  of the mA and/or kV according to patient size and/or use of iterative reconstruction technique. COMPARISON:  MR L Spine 06/04/21 FINDINGS: Segmentation: 6 non-rib-bearing vertebral bodies with lumbarization of S1. Alignment:  Grade 1 anterolisthesis of L5 on S1. Vertebrae: Mildly displaced fracture through the left S1 transverse process (series 5, image 44). There are nondisplaced fractures through the sacral ala bilaterally extending into the bilateral SI joints (series 3, image 109, 110). Paraspinal and other soft tissues: Aortic atherosclerotic calcifications. 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 Left kidney is not visualized. Disc levels: Moderate to severe spinal canal stenosis at L3-L4, L4-L5, and L5-S1. 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 1. Acute mildly displaced fracture through the left S1 transverse process. 2. Acute nondisplaced fractures through the sacral ala bilaterally extending into the bilateral SI joints. 3. Moderate to severe spinal canal stenosis at L3-L4, L4-L5, and L5-S1. Stable to slightly progressed compared to 2022. Aortic Atherosclerosis (ICD10-I70.0). Electronically Signed   By: Lorenza Cambridge M.D.   On: 01/02/2023 17:03   DG Hip Unilat W or Wo Pelvis 2-3 Views Left  Result Date: 01/02/2023 CLINICAL DATA:  Pain after fall EXAM: DG HIP (WITH OR WITHOUT PELVIS) 3V LEFT COMPARISON:  None Available. FINDINGS: Osteopenia. Preserved joint spaces. Hyperostosis. No obvious fracture. Mild degenerative changes of the sacroiliac joints. Soft tissue calcifications along the upper medial thigh regions bilaterally, nonspecific. With this level of osteopenia evaluation for subtle nondisplaced injury is difficult to completely exclude and if needed cross-sectional imaging as clinically appropriate. IMPRESSION: Osteopenia with degenerative changes. Electronically Signed   By: Karen Kays M.D.   On: 01/02/2023 16:33      Subjective: Patient seen and examined at bedside.  No fever, worsening shortness of breath, vomiting.   Discharge Exam: Vitals:   01/30/23 1034 01/30/23 1301  BP: (!) 103/54 (!) 129/100  Pulse: (!) 56 (!)  104  Resp: 18 16  Temp: 97.8 F (36.6 C) 97.7 F (36.5 C)  SpO2: 96% 92%    General: Currently on room air.  No distress.  Chronically ill and deconditioned looking.  Slow to respond. respiratory: Decreased breath sounds at bases bilaterally with some crackles CVS: Mild intermittent bradycardia present; S1-S2 heard  abdominal: Soft, nontender, slightly distended, no organomegaly; normal bowel sounds are heard  extremities: Trace lower extremity edema; no clubbing.      The results of significant diagnostics from this hospitalization (including imaging, microbiology, ancillary and laboratory) are listed below for reference.     Microbiology: No results found for this or any previous visit (from the past 240 hour(s)).   Labs: BNP (last 3 results) Recent Labs    10/28/22 1221 11/01/22 1533 12/31/22 1227  BNP 314.1* 224.1* 704.0*   Basic Metabolic Panel: Recent Labs  Lab 01/26/23 1928 01/27/23 0318 01/28/23 0445 01/29/23 0601 01/30/23 0536  NA 128* 130* 127* 134* 132*  K 3.3* 4.2 3.3* 3.9 3.9  CL 97* 98 100 107 102  CO2 17* 20* 19* 21* 22  GLUCOSE 100* 86 88 101* 101*  BUN 13 11 11 12 15   CREATININE 0.77 0.77 0.86 1.03* 1.04*  CALCIUM 9.2 9.4 8.8* 8.9 8.7*   Liver Function Tests: Recent Labs  Lab 01/26/23 1928 01/27/23 0318 01/28/23 0445 01/29/23 0601 01/30/23 0536  AST 83* 59* 32 28 25  ALT 109* 90* 59* 51* 45*  ALKPHOS 370* 323* 246* 230* 215*  BILITOT 0.7 0.5 0.7 0.5 0.4  PROT 6.6 6.4* 5.8*  5.9* 5.8*  ALBUMIN 3.4* 3.3* 3.1* 3.0* 3.0*   No results for input(s): "LIPASE", "AMYLASE" in the last 168 hours. No results for input(s): "AMMONIA" in the last 168 hours. CBC: Recent Labs  Lab 01/26/23 0323 01/27/23 0318 01/28/23 0445 01/29/23 0601 01/30/23 0536  WBC 5.7 7.1 5.5 4.8 5.6  NEUTROABS 4.3  --  3.9 3.4 3.9  HGB 11.5* 11.7* 10.4* 9.2* 10.4*  HCT 36.4 36.3 32.0* 29.2* 33.2*  MCV 99.5 98.1 100.6* 102.8* 101.8*  PLT 287 289 225 207 248    Cardiac Enzymes: Recent Labs  Lab 01/26/23 0323  CKTOTAL 57   BNP: Invalid input(s): "POCBNP" CBG: No results for input(s): "GLUCAP" in the last 168 hours. D-Dimer No results for input(s): "DDIMER" in the last 72 hours. Hgb A1c No results for input(s): "HGBA1C" in the last 72 hours. Lipid Profile No results for input(s): "CHOL", "HDL", "LDLCALC", "TRIG", "CHOLHDL", "LDLDIRECT" in the last 72 hours. Thyroid function studies No results for input(s): "TSH", "T4TOTAL", "T3FREE", "THYROIDAB" in the last 72 hours.  Invalid input(s): "FREET3" Anemia work up Recent Labs    01/28/23 0445  VITAMINB12 >7,500*   Urinalysis    Component Value Date/Time   COLORURINE yellow 05/29/2010 0852   APPEARANCEUR Clear 05/29/2010 0852   LABSPEC 1.020 05/29/2010 0852   PHURINE 6.5 05/29/2010 0852   HGBUR negative 05/29/2010 0852   BILIRUBINUR n 10/14/2016 0926   PROTEINUR n 10/14/2016 0926   UROBILINOGEN 0.2 10/14/2016 0926   UROBILINOGEN 1.0 05/29/2010 0852   NITRITE n 10/14/2016 0926   NITRITE negative 05/29/2010 0852   LEUKOCYTESUR Negative 10/14/2016 0926   Sepsis Labs Recent Labs  Lab 01/27/23 0318 01/28/23 0445 01/29/23 0601 01/30/23 0536  WBC 7.1 5.5 4.8 5.6   Microbiology No results found for this or any previous visit (from the past 240 hour(s)).   Time coordinating discharge: 35 minutes  SIGNED:   Glade Lloyd, MD  [facility] 01/30/2023, 1:38 PM

## 2023-01-30 NOTE — TOC Transition Note (Signed)
Transition of Care [facility]) - CM/SW Discharge Note   Patient Details  Name: Leslie [name] MRN: [id number] Date of Birth: 01-18-1951  Transition of Care [facility] CM/SW Contact:  Otelia Santee, LCSW Phone Number: 01/30/2023, 2:11 PM   Clinical Narrative:    Pt's insurance [employer] has been approved. Plan ID: [id number] valid from 7/10 to 7/12. Pt able to transfer to [facility] today. Pt will be going to room 102. RN to call report to [phone]. DC packet made and placed at RN station. PTAR called at 2:05pm.    Final next level of care: Skilled Nursing Facility Barriers to Discharge: Barriers Resolved   Patient Goals and CMS Choice   Choice offered to / list presented to : Patient  Discharge Placement     Existing PASRR number confirmed : 01/28/23          Patient chooses bed at: [facility] Patient to be transferred to facility by: PTAR Name of family member notified: Pt Patient and family notified of of transfer: 01/30/23  Discharge Plan and Services Additional resources added to the After Visit Summary for   In-house Referral: Clinical Social Work   Post Acute Care Choice: Skilled Nursing Facility                               Social Determinants of Health (SDOH) Interventions SDOH Screenings   Food Insecurity: No Food Insecurity (01/26/2023)  Housing: Patient Declined (01/26/2023)  Transportation Needs: No Transportation Needs (01/26/2023)  Utilities: Not At Risk (01/26/2023)  Alcohol Screen: Low Risk  (09/20/2022)  Depression (PHQ2-9): Low Risk  (06/17/2022)  Financial Resource Strain: Low Risk  (09/20/2022)  Physical Activity: Unknown (10/04/2021)  Social Connections: Unknown (09/18/2022)   Received from [facility]  Tobacco Use: Medium Risk (01/26/2023)     Readmission Risk Interventions    01/30/2023    2:10 PM  Readmission Risk Prevention Plan  Transportation Screening Complete  PCP or Specialist Appt within 5-7 Days Complete  Home Care Screening  Complete  Medication Review (RN CM) Complete

## 2023-01-31 DIAGNOSIS — M6281 Muscle weakness (generalized): Secondary | ICD-10-CM | POA: Diagnosis not present

## 2023-01-31 DIAGNOSIS — M48061 Spinal stenosis, lumbar region without neurogenic claudication: Secondary | ICD-10-CM | POA: Diagnosis not present

## 2023-01-31 DIAGNOSIS — R296 Repeated falls: Secondary | ICD-10-CM | POA: Diagnosis not present

## 2023-01-31 DIAGNOSIS — D638 Anemia in other chronic diseases classified elsewhere: Secondary | ICD-10-CM | POA: Diagnosis not present

## 2023-01-31 DIAGNOSIS — I10 Essential (primary) hypertension: Secondary | ICD-10-CM | POA: Diagnosis not present

## 2023-01-31 DIAGNOSIS — I480 Paroxysmal atrial fibrillation: Secondary | ICD-10-CM | POA: Diagnosis not present

## 2023-01-31 DIAGNOSIS — E871 Hypo-osmolality and hyponatremia: Secondary | ICD-10-CM | POA: Diagnosis not present

## 2023-01-31 DIAGNOSIS — I5022 Chronic systolic (congestive) heart failure: Secondary | ICD-10-CM | POA: Diagnosis not present

## 2023-01-31 DIAGNOSIS — E876 Hypokalemia: Secondary | ICD-10-CM | POA: Diagnosis not present

## 2023-02-03 DIAGNOSIS — D638 Anemia in other chronic diseases classified elsewhere: Secondary | ICD-10-CM | POA: Diagnosis not present

## 2023-02-03 DIAGNOSIS — I480 Paroxysmal atrial fibrillation: Secondary | ICD-10-CM | POA: Diagnosis not present

## 2023-02-03 DIAGNOSIS — I5022 Chronic systolic (congestive) heart failure: Secondary | ICD-10-CM | POA: Diagnosis not present

## 2023-02-03 DIAGNOSIS — I10 Essential (primary) hypertension: Secondary | ICD-10-CM | POA: Diagnosis not present

## 2023-02-03 DIAGNOSIS — M48061 Spinal stenosis, lumbar region without neurogenic claudication: Secondary | ICD-10-CM | POA: Diagnosis not present

## 2023-02-03 DIAGNOSIS — E876 Hypokalemia: Secondary | ICD-10-CM | POA: Diagnosis not present

## 2023-02-03 DIAGNOSIS — M6281 Muscle weakness (generalized): Secondary | ICD-10-CM | POA: Diagnosis not present

## 2023-02-03 DIAGNOSIS — E871 Hypo-osmolality and hyponatremia: Secondary | ICD-10-CM | POA: Diagnosis not present

## 2023-02-03 DIAGNOSIS — R296 Repeated falls: Secondary | ICD-10-CM | POA: Diagnosis not present

## 2023-02-04 DIAGNOSIS — R7401 Elevation of levels of liver transaminase levels: Secondary | ICD-10-CM | POA: Diagnosis not present

## 2023-02-04 DIAGNOSIS — Z8781 Personal history of (healed) traumatic fracture: Secondary | ICD-10-CM | POA: Diagnosis not present

## 2023-02-04 DIAGNOSIS — E876 Hypokalemia: Secondary | ICD-10-CM | POA: Diagnosis not present

## 2023-02-04 DIAGNOSIS — E871 Hypo-osmolality and hyponatremia: Secondary | ICD-10-CM | POA: Diagnosis not present

## 2023-02-04 DIAGNOSIS — I480 Paroxysmal atrial fibrillation: Secondary | ICD-10-CM | POA: Diagnosis not present

## 2023-02-04 DIAGNOSIS — I5022 Chronic systolic (congestive) heart failure: Secondary | ICD-10-CM | POA: Diagnosis not present

## 2023-02-04 DIAGNOSIS — M48061 Spinal stenosis, lumbar region without neurogenic claudication: Secondary | ICD-10-CM | POA: Diagnosis not present

## 2023-02-04 DIAGNOSIS — R278 Other lack of coordination: Secondary | ICD-10-CM | POA: Diagnosis not present

## 2023-02-04 DIAGNOSIS — I10 Essential (primary) hypertension: Secondary | ICD-10-CM | POA: Diagnosis not present

## 2023-02-04 DIAGNOSIS — R296 Repeated falls: Secondary | ICD-10-CM | POA: Diagnosis not present

## 2023-02-04 DIAGNOSIS — D638 Anemia in other chronic diseases classified elsewhere: Secondary | ICD-10-CM | POA: Diagnosis not present

## 2023-02-04 DIAGNOSIS — M6281 Muscle weakness (generalized): Secondary | ICD-10-CM | POA: Diagnosis not present

## 2023-02-05 DIAGNOSIS — M48061 Spinal stenosis, lumbar region without neurogenic claudication: Secondary | ICD-10-CM | POA: Diagnosis not present

## 2023-02-05 DIAGNOSIS — D638 Anemia in other chronic diseases classified elsewhere: Secondary | ICD-10-CM | POA: Diagnosis not present

## 2023-02-05 DIAGNOSIS — E871 Hypo-osmolality and hyponatremia: Secondary | ICD-10-CM | POA: Diagnosis not present

## 2023-02-05 DIAGNOSIS — I5022 Chronic systolic (congestive) heart failure: Secondary | ICD-10-CM | POA: Diagnosis not present

## 2023-02-05 DIAGNOSIS — M6281 Muscle weakness (generalized): Secondary | ICD-10-CM | POA: Diagnosis not present

## 2023-02-05 DIAGNOSIS — R296 Repeated falls: Secondary | ICD-10-CM | POA: Diagnosis not present

## 2023-02-05 DIAGNOSIS — E876 Hypokalemia: Secondary | ICD-10-CM | POA: Diagnosis not present

## 2023-02-05 DIAGNOSIS — I10 Essential (primary) hypertension: Secondary | ICD-10-CM | POA: Diagnosis not present

## 2023-02-05 DIAGNOSIS — I480 Paroxysmal atrial fibrillation: Secondary | ICD-10-CM | POA: Diagnosis not present

## 2023-02-07 DIAGNOSIS — R296 Repeated falls: Secondary | ICD-10-CM | POA: Diagnosis not present

## 2023-02-07 DIAGNOSIS — R278 Other lack of coordination: Secondary | ICD-10-CM | POA: Diagnosis not present

## 2023-02-07 DIAGNOSIS — R7401 Elevation of levels of liver transaminase levels: Secondary | ICD-10-CM | POA: Diagnosis not present

## 2023-02-07 DIAGNOSIS — E876 Hypokalemia: Secondary | ICD-10-CM | POA: Diagnosis not present

## 2023-02-07 DIAGNOSIS — E871 Hypo-osmolality and hyponatremia: Secondary | ICD-10-CM | POA: Diagnosis not present

## 2023-02-07 DIAGNOSIS — D638 Anemia in other chronic diseases classified elsewhere: Secondary | ICD-10-CM | POA: Diagnosis not present

## 2023-02-07 DIAGNOSIS — M6281 Muscle weakness (generalized): Secondary | ICD-10-CM | POA: Diagnosis not present

## 2023-02-07 DIAGNOSIS — I480 Paroxysmal atrial fibrillation: Secondary | ICD-10-CM | POA: Diagnosis not present

## 2023-02-07 DIAGNOSIS — I10 Essential (primary) hypertension: Secondary | ICD-10-CM | POA: Diagnosis not present

## 2023-02-07 DIAGNOSIS — I5022 Chronic systolic (congestive) heart failure: Secondary | ICD-10-CM | POA: Diagnosis not present

## 2023-02-07 DIAGNOSIS — M48061 Spinal stenosis, lumbar region without neurogenic claudication: Secondary | ICD-10-CM | POA: Diagnosis not present

## 2023-02-10 DIAGNOSIS — R296 Repeated falls: Secondary | ICD-10-CM | POA: Diagnosis not present

## 2023-02-10 DIAGNOSIS — E871 Hypo-osmolality and hyponatremia: Secondary | ICD-10-CM | POA: Diagnosis not present

## 2023-02-10 DIAGNOSIS — E876 Hypokalemia: Secondary | ICD-10-CM | POA: Diagnosis not present

## 2023-02-10 DIAGNOSIS — M48061 Spinal stenosis, lumbar region without neurogenic claudication: Secondary | ICD-10-CM | POA: Diagnosis not present

## 2023-02-10 DIAGNOSIS — I5022 Chronic systolic (congestive) heart failure: Secondary | ICD-10-CM | POA: Diagnosis not present

## 2023-02-10 DIAGNOSIS — R7401 Elevation of levels of liver transaminase levels: Secondary | ICD-10-CM | POA: Diagnosis not present

## 2023-02-10 DIAGNOSIS — M6281 Muscle weakness (generalized): Secondary | ICD-10-CM | POA: Diagnosis not present

## 2023-02-10 DIAGNOSIS — D638 Anemia in other chronic diseases classified elsewhere: Secondary | ICD-10-CM | POA: Diagnosis not present

## 2023-02-10 DIAGNOSIS — I10 Essential (primary) hypertension: Secondary | ICD-10-CM | POA: Diagnosis not present

## 2023-02-11 DIAGNOSIS — D638 Anemia in other chronic diseases classified elsewhere: Secondary | ICD-10-CM | POA: Diagnosis not present

## 2023-02-11 DIAGNOSIS — I10 Essential (primary) hypertension: Secondary | ICD-10-CM | POA: Diagnosis not present

## 2023-02-11 DIAGNOSIS — E871 Hypo-osmolality and hyponatremia: Secondary | ICD-10-CM | POA: Diagnosis not present

## 2023-02-11 DIAGNOSIS — E876 Hypokalemia: Secondary | ICD-10-CM | POA: Diagnosis not present

## 2023-02-11 DIAGNOSIS — R7401 Elevation of levels of liver transaminase levels: Secondary | ICD-10-CM | POA: Diagnosis not present

## 2023-02-11 DIAGNOSIS — M48061 Spinal stenosis, lumbar region without neurogenic claudication: Secondary | ICD-10-CM | POA: Diagnosis not present

## 2023-02-11 DIAGNOSIS — I5022 Chronic systolic (congestive) heart failure: Secondary | ICD-10-CM | POA: Diagnosis not present

## 2023-02-11 DIAGNOSIS — R296 Repeated falls: Secondary | ICD-10-CM | POA: Diagnosis not present

## 2023-02-11 DIAGNOSIS — M6281 Muscle weakness (generalized): Secondary | ICD-10-CM | POA: Diagnosis not present

## 2023-02-12 DIAGNOSIS — E871 Hypo-osmolality and hyponatremia: Secondary | ICD-10-CM | POA: Diagnosis not present

## 2023-02-12 DIAGNOSIS — M6281 Muscle weakness (generalized): Secondary | ICD-10-CM | POA: Diagnosis not present

## 2023-02-12 DIAGNOSIS — E876 Hypokalemia: Secondary | ICD-10-CM | POA: Diagnosis not present

## 2023-02-12 DIAGNOSIS — I10 Essential (primary) hypertension: Secondary | ICD-10-CM | POA: Diagnosis not present

## 2023-02-12 DIAGNOSIS — R7401 Elevation of levels of liver transaminase levels: Secondary | ICD-10-CM | POA: Diagnosis not present

## 2023-02-12 DIAGNOSIS — R296 Repeated falls: Secondary | ICD-10-CM | POA: Diagnosis not present

## 2023-02-12 DIAGNOSIS — I5022 Chronic systolic (congestive) heart failure: Secondary | ICD-10-CM | POA: Diagnosis not present

## 2023-02-12 DIAGNOSIS — M48061 Spinal stenosis, lumbar region without neurogenic claudication: Secondary | ICD-10-CM | POA: Diagnosis not present

## 2023-02-12 DIAGNOSIS — D638 Anemia in other chronic diseases classified elsewhere: Secondary | ICD-10-CM | POA: Diagnosis not present

## 2023-02-13 ENCOUNTER — Ambulatory Visit: Payer: Medicare PPO | Admitting: Internal Medicine

## 2023-02-13 ENCOUNTER — Encounter: Payer: Self-pay | Admitting: Internal Medicine

## 2023-02-13 VITALS — BP 110/70 | HR 62 | Temp 98.0°F | Wt 175.0 lb

## 2023-02-13 DIAGNOSIS — S3210XD Unspecified fracture of sacrum, subsequent encounter for fracture with routine healing: Secondary | ICD-10-CM

## 2023-02-13 DIAGNOSIS — G479 Sleep disorder, unspecified: Secondary | ICD-10-CM

## 2023-02-13 DIAGNOSIS — Z09 Encounter for follow-up examination after completed treatment for conditions other than malignant neoplasm: Secondary | ICD-10-CM

## 2023-02-13 DIAGNOSIS — T391X1D Poisoning by 4-Aminophenol derivatives, accidental (unintentional), subsequent encounter: Secondary | ICD-10-CM | POA: Diagnosis not present

## 2023-02-13 DIAGNOSIS — F900 Attention-deficit hyperactivity disorder, predominantly inattentive type: Secondary | ICD-10-CM | POA: Diagnosis not present

## 2023-02-13 DIAGNOSIS — I10 Essential (primary) hypertension: Secondary | ICD-10-CM

## 2023-02-13 DIAGNOSIS — S3210XA Unspecified fracture of sacrum, initial encounter for closed fracture: Secondary | ICD-10-CM

## 2023-02-13 DIAGNOSIS — I5022 Chronic systolic (congestive) heart failure: Secondary | ICD-10-CM | POA: Diagnosis not present

## 2023-02-13 MED ORDER — AMPHETAMINE-DEXTROAMPHETAMINE 20 MG PO TABS
20.0000 mg | ORAL_TABLET | Freq: Every day | ORAL | 0 refills | Status: DC
Start: 2023-02-13 — End: 2023-03-20

## 2023-02-13 MED ORDER — AMPHETAMINE-DEXTROAMPHET ER 30 MG PO CP24: 30.0000 mg | ORAL_CAPSULE | ORAL | 0 refills | Status: DC

## 2023-02-13 MED ORDER — AMPHETAMINE-DEXTROAMPHET ER 30 MG PO CP24
30.0000 mg | ORAL_CAPSULE | ORAL | 0 refills | Status: DC
Start: 2023-02-13 — End: 2023-03-20

## 2023-02-13 MED ORDER — ALPRAZOLAM 0.25 MG PO TABS
0.2500 mg | ORAL_TABLET | Freq: Every evening | ORAL | 0 refills | Status: DC | PRN
Start: 2023-02-13 — End: 2023-04-15

## 2023-02-13 MED ORDER — AMPHETAMINE-DEXTROAMPHETAMINE 20 MG PO TABS: 20.0000 mg | ORAL_TABLET | Freq: Every day | ORAL | 0 refills | Status: DC

## 2023-02-13 MED ORDER — OXYCODONE HCL 5 MG PO TABS
5.0000 mg | ORAL_TABLET | ORAL | 0 refills | Status: DC | PRN
Start: 2023-02-13 — End: 2023-03-20

## 2023-02-13 NOTE — Progress Notes (Signed)
Established Patient Office Visit     CC/Reason for Visit: Hospital follow-up, medication refills  HPI: Leslie [name] is a 72 y.o. female who is coming in today for the above mentioned reasons.  She has had some significant change over the last saw her in office.  She suffered a fall at home that caused a sacral fracture back in June.  She had a sacroplasty.  From here she was sent to skilled nursing and then home.  Skilled nursing did not discharge her with enough pain medication so she ran out and unfortunately was taking high doses of Tylenol to control her pain.  She returned to the hospital due to uncontrolled pain and was found to have an accidental Tylenol overdose and was readmitted on Mucomyst.  That hospitalization she was discharged on oxycodone 2.5 mg and a fentanyl patch back to skilled nursing.  She started having visual hallucinations and so fentanyl patch was discontinued.  She is having uncontrolled pain on 2.5 mg of oxycodone every 4 hours.  Here with her daughter today.  PT is scheduled to start coming this week.  They are wondering if she should resume her atorvastatin.  SNF only send her home with 5 days of controlled substances which include her Adderall pain medication and Xanax so they are wanting these filled today.   Past Medical/Surgical History: Past Medical History:  Diagnosis Date   ADD (attention deficit disorder)    Allergy    Anemia    Arthritis 07/22/2014   osteoarthritis left knee   Colon polyps    Hypertension    Left knee pain    chronic   Narcotic addiction (HCC)    Obesity    post gastric bypass   Vitamin D deficiency     Past Surgical History:  Procedure Laterality Date   arthscopic knee surgery Left    several times   BREAST BIOPSY     BREAST SURGERY  years ago   breast biopsy   CHOLECYSTECTOMY     EYE SURGERY Bilateral 2013   Toric Lens implants   EYE SURGERY Right 2014   corneal amniotic membrane   GASTRIC BYPASS  2003    IR SACROPLASTY BILATERAL  01/07/2023   KIDNEY DONATION  2004   TOTAL KNEE ARTHROPLASTY Left 06/16/2015   Procedure: LEFT TOTAL KNEE ARTHROPLASTY;  Surgeon: Kathryne Hitch, MD;  Location: [facility];  Service: Orthopedics;  Laterality: Left;   TOTAL KNEE ARTHROPLASTY Right 05/04/2021   Procedure: RIGHT TOTAL KNEE ARTHROPLASTY;  Surgeon: Kathryne Hitch, MD;  Location: [facility];  Service: Orthopedics;  Laterality: Right;    Social History:  reports that she quit smoking about 30 years ago. Her smoking use included cigarettes. She has never used smokeless tobacco. She reports current alcohol use. She reports that she does not use drugs.  Allergies: Allergies  Allergen Reactions   Amoxicillin     Hives   Nsaids    Sulfamethoxazole Nausea And Vomiting    See patient list of med intolerances due to h/o gastric bypass and nephrectomy    Family History:  Family History  Problem Relation Age of Onset   Heart disease Mother        post CABG history of CHF   COPD Father    Diabetes Sister    Hypertension Sister        post renal transplant   Heart disease Brother        CAD  Current Outpatient Medications:    ALPRAZolam (XANAX) 0.25 MG tablet, Take 1 tablet (0.25 mg total) by mouth at bedtime as needed for anxiety. TAKE 1 TABLET(0.25 MG) BY MOUTH AT BEDTIME AS NEEDED FOR ANXIETY Strength: 0.25 mg, Disp: 5 tablet, Rfl: 0   amiodarone (PACERONE) 200 MG tablet, Take 200 mg by mouth daily., Disp: , Rfl:    amphetamine-dextroamphetamine (ADDERALL XR) 30 MG 24 hr capsule, Take 1 capsule (30 mg total) by mouth every morning., Disp: 30 capsule, Rfl: 0   amphetamine-dextroamphetamine (ADDERALL) 20 MG tablet, Take 1 tablet (20 mg total) by mouth daily., Disp: 30 tablet, Rfl: 0   apixaban (ELIQUIS) 5 MG TABS tablet, Take 1 tablet (5 mg total) by mouth 2 (two) times daily., Disp: 180 tablet, Rfl: 3   atorvastatin (LIPITOR) 40 MG tablet, Take 40 mg by mouth daily., Disp: , Rfl:     dapagliflozin propanediol (FARXIGA) 10 MG TABS tablet, TAKE 1 TABLET(10 MG) BY MOUTH DAILY BEFORE BREAKFAST, Disp: 30 tablet, Rfl: 11   FLUoxetine (PROZAC) 40 MG capsule, TAKE 1 CAPSULE(40 MG) BY MOUTH EVERY MORNING (Patient taking differently: Take 40 mg by mouth in the morning.), Disp: 90 capsule, Rfl: 1   gabapentin (NEURONTIN) 300 MG capsule, Take 1 capsule (300 mg total) by mouth 2 (two) times daily. (Patient taking differently: Take 300 mg by mouth 3 (three) times daily.), Disp: 10 capsule, Rfl: 0   lamoTRIgine (LAMICTAL) 100 MG tablet, TAKE 1 TABLET(100 MG) BY MOUTH TWICE DAILY (Patient taking differently: Take 100 mg by mouth 2 (two) times daily.), Disp: 180 tablet, Rfl: 1   losartan (COZAAR) 25 MG tablet, Take 0.5 tablets (12.5 mg total) by mouth daily., Disp: 45 tablet, Rfl: 3   melatonin 3 MG TABS tablet, Take 1 tablet (3 mg total) by mouth at bedtime., Disp: , Rfl: 0   methocarbamol (ROBAXIN) 500 MG tablet, Take 1 tablet (500 mg total) by mouth every 6 (six) hours as needed for muscle spasms., Disp: 30 tablet, Rfl: 0   metoprolol succinate (TOPROL-XL) 50 MG 24 hr tablet, Take 1 tablet (50 mg total) by mouth at bedtime. Take with or immediately following a meal., Disp: 90 tablet, Rfl: 3   Multiple Vitamins-Minerals (PRESERVISION AREDS 2) CAPS, Take 1 capsule by mouth 2 (two) times daily., Disp: , Rfl:    oxyCODONE (OXY IR/ROXICODONE) 5 MG immediate release tablet, Take 0.5 tablets (2.5 mg total) by mouth every 4 (four) hours as needed for moderate pain. (Patient taking differently: Take 5 mg by mouth every 4 (four) hours as needed for moderate pain.), Disp: 10 tablet, Rfl: 0   polyethylene glycol (MIRALAX / GLYCOLAX) 17 g packet, Take 17 g by mouth daily as needed., Disp: 14 each, Rfl: 0   potassium chloride (KLOR-CON M) 10 MEQ tablet, Take 10 mEq by mouth daily., Disp: , Rfl:    senna-docusate (SENOKOT-S) 8.6-50 MG tablet, Take 2 tablets by mouth 2 (two) times daily. (Patient taking  differently: Take 2 tablets by mouth 2 (two) times daily as needed for mild constipation or moderate constipation.), Disp: , Rfl:    spironolactone (ALDACTONE) 25 MG tablet, Take 0.5 tablets (12.5 mg total) by mouth daily., Disp: 30 tablet, Rfl: 5   Vitamin D, Ergocalciferol, (DRISDOL) 1.25 MG (50000 UNIT) CAPS capsule, Take 1 capsule (50,000 Units total) by mouth every 7 (seven) days., Disp: 5 capsule, Rfl: 0   zolpidem (AMBIEN) 10 MG tablet, Take 10 mg by mouth at bedtime as needed for sleep., Disp: , Rfl:  furosemide (LASIX) 40 MG tablet, Take 20 mg by mouth as needed. Take one half (0.5) tablet by mouth ( 20 mg) as needed for wt gain of 3 lbs in 24 hours or 5 lbs in one week. (Patient not taking: Reported on 02/13/2023), Disp: , Rfl:   Review of Systems:  Negative unless indicated in HPI.   Physical Exam: Vitals:   02/13/23 1439  BP: 110/70  Pulse: 62  Temp: 98 F (36.7 C)  TempSrc: Oral  SpO2: 96%  Weight: 175 lb (79.4 kg)    Body mass index is 30.04 kg/m.   Physical Exam Vitals reviewed.  Constitutional:      Appearance: Normal appearance.  HENT:     Head: Normocephalic and atraumatic.  Eyes:     Conjunctiva/sclera: Conjunctivae normal.     Pupils: Pupils are equal, round, and reactive to light.  Cardiovascular:     Rate and Rhythm: Normal rate and regular rhythm.  Pulmonary:     Effort: Pulmonary effort is normal.     Breath sounds: Normal breath sounds.  Skin:    General: Skin is warm and dry.  Neurological:     General: No focal deficit present.     Mental Status: She is alert and oriented to person, place, and time.  Psychiatric:        Mood and Affect: Mood normal.        Behavior: Behavior normal.        Thought Content: Thought content normal.        Judgment: Judgment normal.     Impression and Plan:  Hospital discharge follow-up  Closed fracture of sacrum, unspecified portion of sacrum, initial encounter (HCC)  Accidental acetaminophen  overdose, subsequent encounter  Essential hypertension  Chronic systolic CHF (congestive heart failure) [facility]  [facility] charts have been reviewed in great detail. -She is now off fentanyl patch due to visual hallucinations.  Due to uncontrolled pain I will increase oxycodone from 2.5 to 5 mg every 4 hours and will give her 180 tablets. -Home health is slated to start this week.  She has been sent home with PT, OT, aide and nursing. -Okay to resume atorvastatin.   Time spent:35 minutes reviewing chart, interviewing and examining patient and formulating plan of care.     Chaya Jan, MD [facility]

## 2023-02-16 DIAGNOSIS — K589 Irritable bowel syndrome without diarrhea: Secondary | ICD-10-CM | POA: Diagnosis not present

## 2023-02-16 DIAGNOSIS — U071 COVID-19: Secondary | ICD-10-CM | POA: Diagnosis not present

## 2023-02-16 DIAGNOSIS — I110 Hypertensive heart disease with heart failure: Secondary | ICD-10-CM | POA: Diagnosis not present

## 2023-02-16 DIAGNOSIS — I480 Paroxysmal atrial fibrillation: Secondary | ICD-10-CM | POA: Diagnosis not present

## 2023-02-16 DIAGNOSIS — E440 Moderate protein-calorie malnutrition: Secondary | ICD-10-CM | POA: Diagnosis not present

## 2023-02-16 DIAGNOSIS — S3210XD Unspecified fracture of sacrum, subsequent encounter for fracture with routine healing: Secondary | ICD-10-CM | POA: Diagnosis not present

## 2023-02-16 DIAGNOSIS — M1990 Unspecified osteoarthritis, unspecified site: Secondary | ICD-10-CM | POA: Diagnosis not present

## 2023-02-16 DIAGNOSIS — I5022 Chronic systolic (congestive) heart failure: Secondary | ICD-10-CM | POA: Diagnosis not present

## 2023-02-16 DIAGNOSIS — M48061 Spinal stenosis, lumbar region without neurogenic claudication: Secondary | ICD-10-CM | POA: Diagnosis not present

## 2023-02-17 ENCOUNTER — Telehealth: Payer: Self-pay | Admitting: Internal Medicine

## 2023-02-17 NOTE — Telephone Encounter (Signed)
Leslie [name], Skilled Nursing - [facility] [phone] *Ok to leave a detailed message on this line.   Verbal Order  1 week 4 2 months 1 2 PRNs   Request to follow Pt for Skilled Nursing   Pt currently has Covid

## 2023-02-17 NOTE — Telephone Encounter (Signed)
Verbal orders given to Ms Huston Foley.

## 2023-02-18 ENCOUNTER — Encounter: Payer: Self-pay | Admitting: Internal Medicine

## 2023-02-18 MED ORDER — METHOCARBAMOL 500 MG PO TABS: 500.0000 mg | ORAL_TABLET | Freq: Four times a day (QID) | ORAL | 0 refills | Status: DC | PRN

## 2023-02-24 DIAGNOSIS — M48062 Spinal stenosis, lumbar region with neurogenic claudication: Secondary | ICD-10-CM | POA: Diagnosis not present

## 2023-02-24 DIAGNOSIS — Z6828 Body mass index (BMI) 28.0-28.9, adult: Secondary | ICD-10-CM | POA: Diagnosis not present

## 2023-02-26 DIAGNOSIS — I110 Hypertensive heart disease with heart failure: Secondary | ICD-10-CM | POA: Diagnosis not present

## 2023-02-26 DIAGNOSIS — M1990 Unspecified osteoarthritis, unspecified site: Secondary | ICD-10-CM | POA: Diagnosis not present

## 2023-02-26 DIAGNOSIS — S3210XD Unspecified fracture of sacrum, subsequent encounter for fracture with routine healing: Secondary | ICD-10-CM | POA: Diagnosis not present

## 2023-02-26 DIAGNOSIS — I480 Paroxysmal atrial fibrillation: Secondary | ICD-10-CM | POA: Diagnosis not present

## 2023-02-26 DIAGNOSIS — I5022 Chronic systolic (congestive) heart failure: Secondary | ICD-10-CM | POA: Diagnosis not present

## 2023-02-26 DIAGNOSIS — E440 Moderate protein-calorie malnutrition: Secondary | ICD-10-CM | POA: Diagnosis not present

## 2023-02-26 DIAGNOSIS — U071 COVID-19: Secondary | ICD-10-CM | POA: Diagnosis not present

## 2023-02-26 DIAGNOSIS — K589 Irritable bowel syndrome without diarrhea: Secondary | ICD-10-CM | POA: Diagnosis not present

## 2023-02-26 DIAGNOSIS — M48061 Spinal stenosis, lumbar region without neurogenic claudication: Secondary | ICD-10-CM | POA: Diagnosis not present

## 2023-02-28 ENCOUNTER — Encounter: Payer: Self-pay | Admitting: Internal Medicine

## 2023-03-05 DIAGNOSIS — I5022 Chronic systolic (congestive) heart failure: Secondary | ICD-10-CM | POA: Diagnosis not present

## 2023-03-05 DIAGNOSIS — E440 Moderate protein-calorie malnutrition: Secondary | ICD-10-CM | POA: Diagnosis not present

## 2023-03-05 DIAGNOSIS — M1990 Unspecified osteoarthritis, unspecified site: Secondary | ICD-10-CM | POA: Diagnosis not present

## 2023-03-05 DIAGNOSIS — I480 Paroxysmal atrial fibrillation: Secondary | ICD-10-CM | POA: Diagnosis not present

## 2023-03-05 DIAGNOSIS — K589 Irritable bowel syndrome without diarrhea: Secondary | ICD-10-CM | POA: Diagnosis not present

## 2023-03-05 DIAGNOSIS — S3210XD Unspecified fracture of sacrum, subsequent encounter for fracture with routine healing: Secondary | ICD-10-CM | POA: Diagnosis not present

## 2023-03-05 DIAGNOSIS — M48061 Spinal stenosis, lumbar region without neurogenic claudication: Secondary | ICD-10-CM | POA: Diagnosis not present

## 2023-03-05 DIAGNOSIS — I110 Hypertensive heart disease with heart failure: Secondary | ICD-10-CM | POA: Diagnosis not present

## 2023-03-05 DIAGNOSIS — U071 COVID-19: Secondary | ICD-10-CM | POA: Diagnosis not present

## 2023-03-06 DIAGNOSIS — S3210XD Unspecified fracture of sacrum, subsequent encounter for fracture with routine healing: Secondary | ICD-10-CM | POA: Diagnosis not present

## 2023-03-06 DIAGNOSIS — U071 COVID-19: Secondary | ICD-10-CM | POA: Diagnosis not present

## 2023-03-06 DIAGNOSIS — I110 Hypertensive heart disease with heart failure: Secondary | ICD-10-CM | POA: Diagnosis not present

## 2023-03-06 DIAGNOSIS — I5022 Chronic systolic (congestive) heart failure: Secondary | ICD-10-CM | POA: Diagnosis not present

## 2023-03-06 DIAGNOSIS — M1990 Unspecified osteoarthritis, unspecified site: Secondary | ICD-10-CM | POA: Diagnosis not present

## 2023-03-06 DIAGNOSIS — I480 Paroxysmal atrial fibrillation: Secondary | ICD-10-CM | POA: Diagnosis not present

## 2023-03-06 DIAGNOSIS — E440 Moderate protein-calorie malnutrition: Secondary | ICD-10-CM | POA: Diagnosis not present

## 2023-03-06 DIAGNOSIS — K589 Irritable bowel syndrome without diarrhea: Secondary | ICD-10-CM | POA: Diagnosis not present

## 2023-03-06 DIAGNOSIS — M48061 Spinal stenosis, lumbar region without neurogenic claudication: Secondary | ICD-10-CM | POA: Diagnosis not present

## 2023-03-07 DIAGNOSIS — E440 Moderate protein-calorie malnutrition: Secondary | ICD-10-CM | POA: Diagnosis not present

## 2023-03-07 DIAGNOSIS — I110 Hypertensive heart disease with heart failure: Secondary | ICD-10-CM | POA: Diagnosis not present

## 2023-03-07 DIAGNOSIS — I5022 Chronic systolic (congestive) heart failure: Secondary | ICD-10-CM | POA: Diagnosis not present

## 2023-03-07 DIAGNOSIS — S3210XD Unspecified fracture of sacrum, subsequent encounter for fracture with routine healing: Secondary | ICD-10-CM | POA: Diagnosis not present

## 2023-03-07 DIAGNOSIS — I480 Paroxysmal atrial fibrillation: Secondary | ICD-10-CM | POA: Diagnosis not present

## 2023-03-07 DIAGNOSIS — M1990 Unspecified osteoarthritis, unspecified site: Secondary | ICD-10-CM | POA: Diagnosis not present

## 2023-03-07 DIAGNOSIS — K589 Irritable bowel syndrome without diarrhea: Secondary | ICD-10-CM | POA: Diagnosis not present

## 2023-03-07 DIAGNOSIS — U071 COVID-19: Secondary | ICD-10-CM | POA: Diagnosis not present

## 2023-03-07 DIAGNOSIS — M48061 Spinal stenosis, lumbar region without neurogenic claudication: Secondary | ICD-10-CM | POA: Diagnosis not present

## 2023-03-10 DIAGNOSIS — U071 COVID-19: Secondary | ICD-10-CM | POA: Diagnosis not present

## 2023-03-10 DIAGNOSIS — I480 Paroxysmal atrial fibrillation: Secondary | ICD-10-CM | POA: Diagnosis not present

## 2023-03-10 DIAGNOSIS — M1990 Unspecified osteoarthritis, unspecified site: Secondary | ICD-10-CM | POA: Diagnosis not present

## 2023-03-10 DIAGNOSIS — E440 Moderate protein-calorie malnutrition: Secondary | ICD-10-CM | POA: Diagnosis not present

## 2023-03-10 DIAGNOSIS — I110 Hypertensive heart disease with heart failure: Secondary | ICD-10-CM | POA: Diagnosis not present

## 2023-03-10 DIAGNOSIS — M48061 Spinal stenosis, lumbar region without neurogenic claudication: Secondary | ICD-10-CM | POA: Diagnosis not present

## 2023-03-10 DIAGNOSIS — K589 Irritable bowel syndrome without diarrhea: Secondary | ICD-10-CM | POA: Diagnosis not present

## 2023-03-10 DIAGNOSIS — S3210XD Unspecified fracture of sacrum, subsequent encounter for fracture with routine healing: Secondary | ICD-10-CM | POA: Diagnosis not present

## 2023-03-10 DIAGNOSIS — I5022 Chronic systolic (congestive) heart failure: Secondary | ICD-10-CM | POA: Diagnosis not present

## 2023-03-11 DIAGNOSIS — M1990 Unspecified osteoarthritis, unspecified site: Secondary | ICD-10-CM | POA: Diagnosis not present

## 2023-03-11 DIAGNOSIS — E440 Moderate protein-calorie malnutrition: Secondary | ICD-10-CM | POA: Diagnosis not present

## 2023-03-11 DIAGNOSIS — K589 Irritable bowel syndrome without diarrhea: Secondary | ICD-10-CM | POA: Diagnosis not present

## 2023-03-11 DIAGNOSIS — I5022 Chronic systolic (congestive) heart failure: Secondary | ICD-10-CM | POA: Diagnosis not present

## 2023-03-11 DIAGNOSIS — M48061 Spinal stenosis, lumbar region without neurogenic claudication: Secondary | ICD-10-CM | POA: Diagnosis not present

## 2023-03-11 DIAGNOSIS — I110 Hypertensive heart disease with heart failure: Secondary | ICD-10-CM | POA: Diagnosis not present

## 2023-03-11 DIAGNOSIS — I480 Paroxysmal atrial fibrillation: Secondary | ICD-10-CM | POA: Diagnosis not present

## 2023-03-11 DIAGNOSIS — U071 COVID-19: Secondary | ICD-10-CM | POA: Diagnosis not present

## 2023-03-11 DIAGNOSIS — S3210XD Unspecified fracture of sacrum, subsequent encounter for fracture with routine healing: Secondary | ICD-10-CM | POA: Diagnosis not present

## 2023-03-12 DIAGNOSIS — I5022 Chronic systolic (congestive) heart failure: Secondary | ICD-10-CM | POA: Diagnosis not present

## 2023-03-12 DIAGNOSIS — M1990 Unspecified osteoarthritis, unspecified site: Secondary | ICD-10-CM | POA: Diagnosis not present

## 2023-03-12 DIAGNOSIS — U071 COVID-19: Secondary | ICD-10-CM | POA: Diagnosis not present

## 2023-03-12 DIAGNOSIS — M48061 Spinal stenosis, lumbar region without neurogenic claudication: Secondary | ICD-10-CM | POA: Diagnosis not present

## 2023-03-12 DIAGNOSIS — E440 Moderate protein-calorie malnutrition: Secondary | ICD-10-CM | POA: Diagnosis not present

## 2023-03-12 DIAGNOSIS — I110 Hypertensive heart disease with heart failure: Secondary | ICD-10-CM | POA: Diagnosis not present

## 2023-03-12 DIAGNOSIS — K589 Irritable bowel syndrome without diarrhea: Secondary | ICD-10-CM | POA: Diagnosis not present

## 2023-03-12 DIAGNOSIS — S3210XD Unspecified fracture of sacrum, subsequent encounter for fracture with routine healing: Secondary | ICD-10-CM | POA: Diagnosis not present

## 2023-03-12 DIAGNOSIS — I480 Paroxysmal atrial fibrillation: Secondary | ICD-10-CM | POA: Diagnosis not present

## 2023-03-15 DIAGNOSIS — R262 Difficulty in walking, not elsewhere classified: Secondary | ICD-10-CM | POA: Diagnosis not present

## 2023-03-18 ENCOUNTER — Other Ambulatory Visit: Payer: Self-pay

## 2023-03-18 ENCOUNTER — Encounter (HOSPITAL_COMMUNITY): Payer: Medicare PPO | Admitting: Cardiology

## 2023-03-18 ENCOUNTER — Encounter: Payer: Self-pay | Admitting: Internal Medicine

## 2023-03-18 ENCOUNTER — Other Ambulatory Visit: Payer: Self-pay | Admitting: Internal Medicine

## 2023-03-18 DIAGNOSIS — M48062 Spinal stenosis, lumbar region with neurogenic claudication: Secondary | ICD-10-CM

## 2023-03-18 DIAGNOSIS — S3210XD Unspecified fracture of sacrum, subsequent encounter for fracture with routine healing: Secondary | ICD-10-CM | POA: Diagnosis not present

## 2023-03-18 DIAGNOSIS — M5416 Radiculopathy, lumbar region: Secondary | ICD-10-CM

## 2023-03-18 DIAGNOSIS — K589 Irritable bowel syndrome without diarrhea: Secondary | ICD-10-CM | POA: Diagnosis not present

## 2023-03-18 DIAGNOSIS — E440 Moderate protein-calorie malnutrition: Secondary | ICD-10-CM | POA: Diagnosis not present

## 2023-03-18 DIAGNOSIS — S3210XA Unspecified fracture of sacrum, initial encounter for closed fracture: Secondary | ICD-10-CM

## 2023-03-18 DIAGNOSIS — I110 Hypertensive heart disease with heart failure: Secondary | ICD-10-CM | POA: Diagnosis not present

## 2023-03-18 DIAGNOSIS — I5022 Chronic systolic (congestive) heart failure: Secondary | ICD-10-CM | POA: Diagnosis not present

## 2023-03-18 DIAGNOSIS — M1990 Unspecified osteoarthritis, unspecified site: Secondary | ICD-10-CM | POA: Diagnosis not present

## 2023-03-18 DIAGNOSIS — U071 COVID-19: Secondary | ICD-10-CM | POA: Diagnosis not present

## 2023-03-18 DIAGNOSIS — I480 Paroxysmal atrial fibrillation: Secondary | ICD-10-CM | POA: Diagnosis not present

## 2023-03-18 DIAGNOSIS — M48061 Spinal stenosis, lumbar region without neurogenic claudication: Secondary | ICD-10-CM | POA: Diagnosis not present

## 2023-03-19 ENCOUNTER — Encounter: Payer: Self-pay | Admitting: Internal Medicine

## 2023-03-19 DIAGNOSIS — S3210XD Unspecified fracture of sacrum, subsequent encounter for fracture with routine healing: Secondary | ICD-10-CM | POA: Diagnosis not present

## 2023-03-19 DIAGNOSIS — M48061 Spinal stenosis, lumbar region without neurogenic claudication: Secondary | ICD-10-CM | POA: Diagnosis not present

## 2023-03-19 DIAGNOSIS — I480 Paroxysmal atrial fibrillation: Secondary | ICD-10-CM | POA: Diagnosis not present

## 2023-03-19 DIAGNOSIS — U071 COVID-19: Secondary | ICD-10-CM | POA: Diagnosis not present

## 2023-03-19 DIAGNOSIS — M1990 Unspecified osteoarthritis, unspecified site: Secondary | ICD-10-CM | POA: Diagnosis not present

## 2023-03-19 DIAGNOSIS — K589 Irritable bowel syndrome without diarrhea: Secondary | ICD-10-CM | POA: Diagnosis not present

## 2023-03-19 DIAGNOSIS — I5022 Chronic systolic (congestive) heart failure: Secondary | ICD-10-CM | POA: Diagnosis not present

## 2023-03-19 DIAGNOSIS — I110 Hypertensive heart disease with heart failure: Secondary | ICD-10-CM | POA: Diagnosis not present

## 2023-03-19 DIAGNOSIS — E440 Moderate protein-calorie malnutrition: Secondary | ICD-10-CM | POA: Diagnosis not present

## 2023-03-19 NOTE — Telephone Encounter (Signed)
Appointment scheduled.

## 2023-03-20 ENCOUNTER — Ambulatory Visit: Payer: Medicare PPO | Admitting: Internal Medicine

## 2023-03-20 ENCOUNTER — Encounter: Payer: Self-pay | Admitting: Internal Medicine

## 2023-03-20 ENCOUNTER — Ambulatory Visit: Payer: Medicare PPO

## 2023-03-20 ENCOUNTER — Ambulatory Visit: Payer: Medicare PPO | Admitting: Cardiovascular Disease

## 2023-03-20 ENCOUNTER — Other Ambulatory Visit: Payer: Self-pay | Admitting: Internal Medicine

## 2023-03-20 VITALS — BP 106/43 | HR 47 | Temp 97.4°F | Wt 168.2 lb

## 2023-03-20 DIAGNOSIS — S3219XG Other fracture of sacrum, subsequent encounter for fracture with delayed healing: Secondary | ICD-10-CM | POA: Diagnosis not present

## 2023-03-20 DIAGNOSIS — K589 Irritable bowel syndrome without diarrhea: Secondary | ICD-10-CM | POA: Diagnosis not present

## 2023-03-20 DIAGNOSIS — S3210XD Unspecified fracture of sacrum, subsequent encounter for fracture with routine healing: Secondary | ICD-10-CM | POA: Diagnosis not present

## 2023-03-20 DIAGNOSIS — N289 Disorder of kidney and ureter, unspecified: Secondary | ICD-10-CM

## 2023-03-20 DIAGNOSIS — N390 Urinary tract infection, site not specified: Secondary | ICD-10-CM | POA: Diagnosis not present

## 2023-03-20 DIAGNOSIS — M1990 Unspecified osteoarthritis, unspecified site: Secondary | ICD-10-CM | POA: Diagnosis not present

## 2023-03-20 DIAGNOSIS — S3210XA Unspecified fracture of sacrum, initial encounter for closed fracture: Secondary | ICD-10-CM | POA: Diagnosis not present

## 2023-03-20 DIAGNOSIS — I5022 Chronic systolic (congestive) heart failure: Secondary | ICD-10-CM

## 2023-03-20 DIAGNOSIS — F900 Attention-deficit hyperactivity disorder, predominantly inattentive type: Secondary | ICD-10-CM | POA: Diagnosis not present

## 2023-03-20 DIAGNOSIS — U071 COVID-19: Secondary | ICD-10-CM | POA: Diagnosis not present

## 2023-03-20 DIAGNOSIS — E440 Moderate protein-calorie malnutrition: Secondary | ICD-10-CM | POA: Diagnosis not present

## 2023-03-20 DIAGNOSIS — Z9884 Bariatric surgery status: Secondary | ICD-10-CM

## 2023-03-20 DIAGNOSIS — M48061 Spinal stenosis, lumbar region without neurogenic claudication: Secondary | ICD-10-CM | POA: Diagnosis not present

## 2023-03-20 DIAGNOSIS — I110 Hypertensive heart disease with heart failure: Secondary | ICD-10-CM | POA: Diagnosis not present

## 2023-03-20 DIAGNOSIS — I480 Paroxysmal atrial fibrillation: Secondary | ICD-10-CM | POA: Diagnosis not present

## 2023-03-20 LAB — POC URINALSYSI DIPSTICK (AUTOMATED)
Bilirubin, UA: NEGATIVE
Blood, UA: NEGATIVE
Glucose, UA: POSITIVE — AB
Ketones, UA: NEGATIVE
Nitrite, UA: NEGATIVE
Protein, UA: NEGATIVE
Spec Grav, UA: 1.015 (ref 1.010–1.025)
Urobilinogen, UA: 0.2 E.U./dL
pH, UA: 6 (ref 5.0–8.0)

## 2023-03-20 MED ORDER — GABAPENTIN 300 MG PO CAPS
ORAL_CAPSULE | ORAL | Status: DC
Start: 2023-03-20 — End: 2023-04-28

## 2023-03-20 MED ORDER — OXYCODONE HCL 5 MG PO TABS
5.0000 mg | ORAL_TABLET | Freq: Two times a day (BID) | ORAL | 0 refills | Status: DC | PRN
Start: 2023-03-20 — End: 2023-05-02

## 2023-03-20 NOTE — Progress Notes (Addendum)
Established Patient Office Visit     CC/Reason for Visit: Discuss acute concerns and medication refills  HPI: Leslie [name] is a 72 y.o. female who is coming in today for the above mentioned reasons. Past Medical History is significant for: Hypertension, depression and ADHD.  Her health has been complicated by a sacral fracture that is causing significant back, hip and leg pain, she has been unable to walk since this occurred in June.  Pain management is complicated as she was hospitalized this year as well for Tylenol overdose due to pain.  She was last prescribed 180 tablets of oxycodone and is requesting a refill.  Unfortunately her daughter says how she has been more confused and somnolent.  She believes she has a UTI.  In office dipstick does indeed show 2+ leukocytes but I am also very much concerned about polypharmacy as she is on multiple controlled substances.  They have an appointment for nerve conduction study coming up.   Past Medical/Surgical History: Past Medical History:  Diagnosis Date   ADD (attention deficit disorder)    Allergy    Anemia    Arthritis 07/22/2014   osteoarthritis left knee   Colon polyps    Hypertension    Left knee pain    chronic   Narcotic addiction (HCC)    Obesity    post gastric bypass   Vitamin D deficiency     Past Surgical History:  Procedure Laterality Date   arthscopic knee surgery Left    several times   BREAST BIOPSY     BREAST SURGERY  years ago   breast biopsy   CHOLECYSTECTOMY     EYE SURGERY Bilateral 2013   Toric Lens implants   EYE SURGERY Right 2014   corneal amniotic membrane   GASTRIC BYPASS  2003   IR SACROPLASTY BILATERAL  01/07/2023   KIDNEY DONATION  2004   TOTAL KNEE ARTHROPLASTY Left 06/16/2015   Procedure: LEFT TOTAL KNEE ARTHROPLASTY;  Surgeon: Kathryne Hitch, MD;  Location: [facility];  Service: Orthopedics;  Laterality: Left;   TOTAL KNEE ARTHROPLASTY Right 05/04/2021   Procedure: RIGHT  TOTAL KNEE ARTHROPLASTY;  Surgeon: Kathryne Hitch, MD;  Location: [facility];  Service: Orthopedics;  Laterality: Right;    Social History:  reports that she quit smoking about 30 years ago. Her smoking use included cigarettes. She has never used smokeless tobacco. She reports current alcohol use. She reports that she does not use drugs.  Allergies: Allergies  Allergen Reactions   Amoxicillin     Hives   Nsaids    Sulfamethoxazole Nausea And Vomiting    See patient list of med intolerances due to h/o gastric bypass and nephrectomy    Family History:  Family History  Problem Relation Age of Onset   Heart disease Mother        post CABG history of CHF   COPD Father    Diabetes Sister    Hypertension Sister        post renal transplant   Heart disease Brother        CAD     Current Outpatient Medications:    ALPRAZolam (XANAX) 0.25 MG tablet, Take 1 tablet (0.25 mg total) by mouth at bedtime as needed for anxiety. TAKE 1 TABLET(0.25 MG) BY MOUTH AT BEDTIME AS NEEDED FOR ANXIETY Strength: 0.25 mg, Disp: 30 tablet, Rfl: 0   amiodarone (PACERONE) 200 MG tablet, Take 200 mg by mouth daily., Disp: , Rfl:  apixaban (ELIQUIS) 5 MG TABS tablet, Take 1 tablet (5 mg total) by mouth 2 (two) times daily., Disp: 180 tablet, Rfl: 3   atorvastatin (LIPITOR) 40 MG tablet, Take 40 mg by mouth daily., Disp: , Rfl:    dapagliflozin propanediol (FARXIGA) 10 MG TABS tablet, TAKE 1 TABLET(10 MG) BY MOUTH DAILY BEFORE BREAKFAST, Disp: 30 tablet, Rfl: 11   FLUoxetine (PROZAC) 40 MG capsule, TAKE 1 CAPSULE(40 MG) BY MOUTH EVERY MORNING (Patient taking differently: Take 40 mg by mouth in the morning.), Disp: 90 capsule, Rfl: 1   furosemide (LASIX) 40 MG tablet, Take 20 mg by mouth as needed. Take one half (0.5) tablet by mouth ( 20 mg) as needed for wt gain of 3 lbs in 24 hours or 5 lbs in one week., Disp: , Rfl:    lamoTRIgine (LAMICTAL) 100 MG tablet, TAKE 1 TABLET(100 MG) BY MOUTH TWICE DAILY  (Patient taking differently: Take 100 mg by mouth 2 (two) times daily.), Disp: 180 tablet, Rfl: 1   losartan (COZAAR) 25 MG tablet, Take 0.5 tablets (12.5 mg total) by mouth daily., Disp: 45 tablet, Rfl: 3   melatonin 3 MG TABS tablet, Take 1 tablet (3 mg total) by mouth at bedtime., Disp: , Rfl: 0   methocarbamol (ROBAXIN) 500 MG tablet, Take 1 tablet (500 mg total) by mouth every 6 (six) hours as needed for muscle spasms., Disp: 30 tablet, Rfl: 0   metoprolol succinate (TOPROL-XL) 50 MG 24 hr tablet, Take 1 tablet (50 mg total) by mouth at bedtime. Take with or immediately following a meal., Disp: 90 tablet, Rfl: 3   Multiple Vitamins-Minerals (PRESERVISION AREDS 2) CAPS, Take 1 capsule by mouth 2 (two) times daily., Disp: , Rfl:    polyethylene glycol (MIRALAX / GLYCOLAX) 17 g packet, Take 17 g by mouth daily as needed., Disp: 14 each, Rfl: 0   potassium chloride (KLOR-CON M) 10 MEQ tablet, Take 10 mEq by mouth daily., Disp: , Rfl:    senna-docusate (SENOKOT-S) 8.6-50 MG tablet, Take 2 tablets by mouth 2 (two) times daily. (Patient taking differently: Take 2 tablets by mouth 2 (two) times daily as needed for mild constipation or moderate constipation.), Disp: , Rfl:    spironolactone (ALDACTONE) 25 MG tablet, Take 0.5 tablets (12.5 mg total) by mouth daily., Disp: 30 tablet, Rfl: 5   Vitamin D, Ergocalciferol, (DRISDOL) 1.25 MG (50000 UNIT) CAPS capsule, Take 1 capsule (50,000 Units total) by mouth every 7 (seven) days., Disp: 5 capsule, Rfl: 0   gabapentin (NEURONTIN) 300 MG capsule, Take 300 mg at morning and noon and 600 mg at bedtime., Disp: , Rfl:    oxyCODONE (OXY IR/ROXICODONE) 5 MG immediate release tablet, Take 1 tablet (5 mg total) by mouth every 12 (twelve) hours as needed for moderate pain., Disp: 60 tablet, Rfl: 0  Review of Systems:  Negative unless indicated in HPI.   Physical Exam: Vitals:   03/20/23 0837  BP: (!) 106/43  Pulse: (!) 47  Temp: (!) 97.4 F (36.3 C)  TempSrc:  Oral  SpO2: 91%  Weight: 168 lb 3.2 oz (76.3 kg)    Body mass index is 28.87 kg/m.   Physical Exam Vitals reviewed.  Constitutional:      Appearance: Normal appearance.  HENT:     Head: Normocephalic and atraumatic.  Eyes:     Conjunctiva/sclera: Conjunctivae normal.     Pupils: Pupils are equal, round, and reactive to light.  Cardiovascular:     Rate and Rhythm: Normal rate  and regular rhythm.  Pulmonary:     Effort: Pulmonary effort is normal.     Breath sounds: Normal breath sounds.  Skin:    General: Skin is warm and dry.  Neurological:     General: No focal deficit present.     Mental Status: She is alert and oriented to person, place, and time.  Psychiatric:        Mood and Affect: Mood normal.        Behavior: Behavior normal.        Thought Content: Thought content normal.        Judgment: Judgment normal.      Impression and Plan:  Urinary tract infection without hematuria, site unspecified -     POCT Urinalysis Dipstick (Automated)  Attention deficit hyperactivity disorder (ADHD), predominantly inattentive type  Chronic systolic CHF (congestive heart failure) (HCC)  S/P gastric bypass  Closed fracture of sacrum, unspecified portion of sacrum, initial encounter (HCC) -     oxyCODONE HCl; Take 1 tablet (5 mg total) by mouth every 12 (twelve) hours as needed for moderate pain.  Dispense: 60 tablet; Refill: 0  Renal insufficiency  Other closed fracture of sacrum with delayed healing, subsequent encounter -     Gabapentin; Take 300 mg at morning and noon and 600 mg at bedtime. -     oxyCODONE HCl; Take 1 tablet (5 mg total) by mouth every 12 (twelve) hours as needed for moderate pain.  Dispense: 60 tablet; Refill: 0   -We have agreed to discontinue Ambien and Adderall as I do not believe these are contributing much at this time. -Gabapentin dose has been increased to 300 mg morning and noon and 600 mg at nighttime about a month ago by neurosurgery and  they believe that is causing some pain relief. -She will continue to use low-dose Xanax only as needed for sleep. -I have agreed to give her another month of oxycodone until she can have nerve conduction studies and other workup by orthopedics to try and determine the cause of her pain.  However, because of her symptoms we have agreed to decrease dosing from every 4 hours to every 12 hours.  This is a total of 60 tablets that should last her a full month.  I have explained to patient and daughter that I am not willing to start her on a chronic narcotic pain regimen and if we get to this point we will need to discuss this further.  Pain management in her is complex as she suffered a recent Tylenol overdose and should limit NSAID use due to history of gastric bypass. -She was not able to produce enough urine for urine sample, sterile cup provided and they will return later today for analysis.  Hold antibiotics until then.   Time spent:38 minutes reviewing chart, interviewing and examining patient and formulating plan of care.     Chaya Jan, MD [facility]

## 2023-03-20 NOTE — H&P (View-Only) (Signed)
Established Patient Office Visit     CC/Reason for Visit: Discuss acute concerns and medication refills  HPI: Leslie [name] is a 72 y.o. female who is coming in today for the above mentioned reasons. Past Medical History is significant for: Hypertension, depression and ADHD.  Her health has been complicated by a sacral fracture that is causing significant back, hip and leg pain, she has been unable to walk since this occurred in June.  Pain management is complicated as she was hospitalized this year as well for Tylenol overdose due to pain.  She was last prescribed 180 tablets of oxycodone and is requesting a refill.  Unfortunately her daughter says how she has been more confused and somnolent.  She believes she has a UTI.  In office dipstick does indeed show 2+ leukocytes but I am also very much concerned about polypharmacy as she is on multiple controlled substances.  They have an appointment for nerve conduction study coming up.   Past Medical/Surgical History: Past Medical History:  Diagnosis Date   ADD (attention deficit disorder)    Allergy    Anemia    Arthritis 07/22/2014   osteoarthritis left knee   Colon polyps    Hypertension    Left knee pain    chronic   Narcotic addiction (HCC)    Obesity    post gastric bypass   Vitamin D deficiency     Past Surgical History:  Procedure Laterality Date   arthscopic knee surgery Left    several times   BREAST BIOPSY     BREAST SURGERY  years ago   breast biopsy   CHOLECYSTECTOMY     EYE SURGERY Bilateral 2013   Toric Lens implants   EYE SURGERY Right 2014   corneal amniotic membrane   GASTRIC BYPASS  2003   IR SACROPLASTY BILATERAL  01/07/2023   KIDNEY DONATION  2004   TOTAL KNEE ARTHROPLASTY Left 06/16/2015   Procedure: LEFT TOTAL KNEE ARTHROPLASTY;  Surgeon: Kathryne Hitch, MD;  Location: [facility];  Service: Orthopedics;  Laterality: Left;   TOTAL KNEE ARTHROPLASTY Right 05/04/2021   Procedure: RIGHT  TOTAL KNEE ARTHROPLASTY;  Surgeon: Kathryne Hitch, MD;  Location: [facility];  Service: Orthopedics;  Laterality: Right;    Social History:  reports that she quit smoking about 30 years ago. Her smoking use included cigarettes. She has never used smokeless tobacco. She reports current alcohol use. She reports that she does not use drugs.  Allergies: Allergies  Allergen Reactions   Amoxicillin     Hives   Nsaids    Sulfamethoxazole Nausea And Vomiting    See patient list of med intolerances due to h/o gastric bypass and nephrectomy    Family History:  Family History  Problem Relation Age of Onset   Heart disease Mother        post CABG history of CHF   COPD Father    Diabetes Sister    Hypertension Sister        post renal transplant   Heart disease Brother        CAD     Current Outpatient Medications:    ALPRAZolam (XANAX) 0.25 MG tablet, Take 1 tablet (0.25 mg total) by mouth at bedtime as needed for anxiety. TAKE 1 TABLET(0.25 MG) BY MOUTH AT BEDTIME AS NEEDED FOR ANXIETY Strength: 0.25 mg, Disp: 30 tablet, Rfl: 0   amiodarone (PACERONE) 200 MG tablet, Take 200 mg by mouth daily., Disp: , Rfl:  apixaban (ELIQUIS) 5 MG TABS tablet, Take 1 tablet (5 mg total) by mouth 2 (two) times daily., Disp: 180 tablet, Rfl: 3   atorvastatin (LIPITOR) 40 MG tablet, Take 40 mg by mouth daily., Disp: , Rfl:    dapagliflozin propanediol (FARXIGA) 10 MG TABS tablet, TAKE 1 TABLET(10 MG) BY MOUTH DAILY BEFORE BREAKFAST, Disp: 30 tablet, Rfl: 11   FLUoxetine (PROZAC) 40 MG capsule, TAKE 1 CAPSULE(40 MG) BY MOUTH EVERY MORNING (Patient taking differently: Take 40 mg by mouth in the morning.), Disp: 90 capsule, Rfl: 1   furosemide (LASIX) 40 MG tablet, Take 20 mg by mouth as needed. Take one half (0.5) tablet by mouth ( 20 mg) as needed for wt gain of 3 lbs in 24 hours or 5 lbs in one week., Disp: , Rfl:    lamoTRIgine (LAMICTAL) 100 MG tablet, TAKE 1 TABLET(100 MG) BY MOUTH TWICE DAILY  (Patient taking differently: Take 100 mg by mouth 2 (two) times daily.), Disp: 180 tablet, Rfl: 1   losartan (COZAAR) 25 MG tablet, Take 0.5 tablets (12.5 mg total) by mouth daily., Disp: 45 tablet, Rfl: 3   melatonin 3 MG TABS tablet, Take 1 tablet (3 mg total) by mouth at bedtime., Disp: , Rfl: 0   methocarbamol (ROBAXIN) 500 MG tablet, Take 1 tablet (500 mg total) by mouth every 6 (six) hours as needed for muscle spasms., Disp: 30 tablet, Rfl: 0   metoprolol succinate (TOPROL-XL) 50 MG 24 hr tablet, Take 1 tablet (50 mg total) by mouth at bedtime. Take with or immediately following a meal., Disp: 90 tablet, Rfl: 3   Multiple Vitamins-Minerals (PRESERVISION AREDS 2) CAPS, Take 1 capsule by mouth 2 (two) times daily., Disp: , Rfl:    polyethylene glycol (MIRALAX / GLYCOLAX) 17 g packet, Take 17 g by mouth daily as needed., Disp: 14 each, Rfl: 0   potassium chloride (KLOR-CON M) 10 MEQ tablet, Take 10 mEq by mouth daily., Disp: , Rfl:    senna-docusate (SENOKOT-S) 8.6-50 MG tablet, Take 2 tablets by mouth 2 (two) times daily. (Patient taking differently: Take 2 tablets by mouth 2 (two) times daily as needed for mild constipation or moderate constipation.), Disp: , Rfl:    spironolactone (ALDACTONE) 25 MG tablet, Take 0.5 tablets (12.5 mg total) by mouth daily., Disp: 30 tablet, Rfl: 5   Vitamin D, Ergocalciferol, (DRISDOL) 1.25 MG (50000 UNIT) CAPS capsule, Take 1 capsule (50,000 Units total) by mouth every 7 (seven) days., Disp: 5 capsule, Rfl: 0   gabapentin (NEURONTIN) 300 MG capsule, Take 300 mg at morning and noon and 600 mg at bedtime., Disp: , Rfl:    oxyCODONE (OXY IR/ROXICODONE) 5 MG immediate release tablet, Take 1 tablet (5 mg total) by mouth every 12 (twelve) hours as needed for moderate pain., Disp: 60 tablet, Rfl: 0  Review of Systems:  Negative unless indicated in HPI.   Physical Exam: Vitals:   03/20/23 0837  BP: (!) 106/43  Pulse: (!) 47  Temp: (!) 97.4 F (36.3 C)  TempSrc:  Oral  SpO2: 91%  Weight: 168 lb 3.2 oz (76.3 kg)    Body mass index is 28.87 kg/m.   Physical Exam Vitals reviewed.  Constitutional:      Appearance: Normal appearance.  HENT:     Head: Normocephalic and atraumatic.  Eyes:     Conjunctiva/sclera: Conjunctivae normal.     Pupils: Pupils are equal, round, and reactive to light.  Cardiovascular:     Rate and Rhythm: Normal rate  and regular rhythm.  Pulmonary:     Effort: Pulmonary effort is normal.     Breath sounds: Normal breath sounds.  Skin:    General: Skin is warm and dry.  Neurological:     General: No focal deficit present.     Mental Status: She is alert and oriented to person, place, and time.  Psychiatric:        Mood and Affect: Mood normal.        Behavior: Behavior normal.        Thought Content: Thought content normal.        Judgment: Judgment normal.      Impression and Plan:  Urinary tract infection without hematuria, site unspecified -     POCT Urinalysis Dipstick (Automated)  Attention deficit hyperactivity disorder (ADHD), predominantly inattentive type  Chronic systolic CHF (congestive heart failure) (HCC)  S/P gastric bypass  Closed fracture of sacrum, unspecified portion of sacrum, initial encounter (HCC) -     oxyCODONE HCl; Take 1 tablet (5 mg total) by mouth every 12 (twelve) hours as needed for moderate pain.  Dispense: 60 tablet; Refill: 0  Renal insufficiency  Other closed fracture of sacrum with delayed healing, subsequent encounter -     Gabapentin; Take 300 mg at morning and noon and 600 mg at bedtime. -     oxyCODONE HCl; Take 1 tablet (5 mg total) by mouth every 12 (twelve) hours as needed for moderate pain.  Dispense: 60 tablet; Refill: 0   -We have agreed to discontinue Ambien and Adderall as I do not believe these are contributing much at this time. -Gabapentin dose has been increased to 300 mg morning and noon and 600 mg at nighttime about a month ago by neurosurgery and  they believe that is causing some pain relief. -She will continue to use low-dose Xanax only as needed for sleep. -I have agreed to give her another month of oxycodone until she can have nerve conduction studies and other workup by orthopedics to try and determine the cause of her pain.  However, because of her symptoms we have agreed to decrease dosing from every 4 hours to every 12 hours.  This is a total of 60 tablets that should last her a full month.  I have explained to patient and daughter that I am not willing to start her on a chronic narcotic pain regimen and if we get to this point we will need to discuss this further.  Pain management in her is complex as she suffered a recent Tylenol overdose and should limit NSAID use due to history of gastric bypass. -She was not able to produce enough urine for urine sample, sterile cup provided and they will return later today for analysis.  Hold antibiotics until then.   Time spent:38 minutes reviewing chart, interviewing and examining patient and formulating plan of care.     Chaya Jan, MD [facility]

## 2023-03-21 ENCOUNTER — Telehealth: Payer: Self-pay | Admitting: Internal Medicine

## 2023-03-21 DIAGNOSIS — N390 Urinary tract infection, site not specified: Secondary | ICD-10-CM | POA: Diagnosis not present

## 2023-03-21 LAB — URINALYSIS, ROUTINE W REFLEX MICROSCOPIC
Bilirubin Urine: NEGATIVE
Hgb urine dipstick: NEGATIVE
Ketones, ur: NEGATIVE
Nitrite: NEGATIVE
Specific Gravity, Urine: 1.015 (ref 1.000–1.030)
Total Protein, Urine: NEGATIVE
Urine Glucose: 250 — AB
Urobilinogen, UA: 0.2 (ref 0.0–1.0)
pH: 6 (ref 5.0–8.0)

## 2023-03-21 NOTE — Telephone Encounter (Signed)
Prescription Request  03/21/2023  LOV: 03/20/2023  What is the name of the medication or equipment? Vitamin D.  Have you contacted your pharmacy to request a refill? Yes   Which pharmacy would you like this sent to? [id number] [place], [place] - [address] AT [facility] [address] [facility] [address] & [facility] [address] [place] [address] Phone: [phone] Fax: [phone]  Patient notified that their request is being sent to the clinical staff for review and that they should receive a response within 2 business days.   Please advise at Mobile [phone] (mobile)

## 2023-03-23 ENCOUNTER — Encounter (INDEPENDENT_AMBULATORY_CARE_PROVIDER_SITE_OTHER): Payer: Self-pay

## 2023-03-23 LAB — URINE CULTURE
MICRO NUMBER:: 15405590
SPECIMEN QUALITY:: ADEQUATE

## 2023-03-25 ENCOUNTER — Ambulatory Visit: Payer: Medicare PPO | Attending: Cardiovascular Disease

## 2023-03-25 DIAGNOSIS — M48061 Spinal stenosis, lumbar region without neurogenic claudication: Secondary | ICD-10-CM | POA: Diagnosis not present

## 2023-03-25 DIAGNOSIS — I480 Paroxysmal atrial fibrillation: Secondary | ICD-10-CM | POA: Diagnosis not present

## 2023-03-25 DIAGNOSIS — S3210XD Unspecified fracture of sacrum, subsequent encounter for fracture with routine healing: Secondary | ICD-10-CM | POA: Diagnosis not present

## 2023-03-25 DIAGNOSIS — E440 Moderate protein-calorie malnutrition: Secondary | ICD-10-CM | POA: Diagnosis not present

## 2023-03-25 DIAGNOSIS — K589 Irritable bowel syndrome without diarrhea: Secondary | ICD-10-CM | POA: Diagnosis not present

## 2023-03-25 DIAGNOSIS — I110 Hypertensive heart disease with heart failure: Secondary | ICD-10-CM | POA: Diagnosis not present

## 2023-03-25 DIAGNOSIS — I5022 Chronic systolic (congestive) heart failure: Secondary | ICD-10-CM | POA: Diagnosis not present

## 2023-03-25 DIAGNOSIS — U071 COVID-19: Secondary | ICD-10-CM | POA: Diagnosis not present

## 2023-03-25 DIAGNOSIS — M1990 Unspecified osteoarthritis, unspecified site: Secondary | ICD-10-CM | POA: Diagnosis not present

## 2023-03-25 MED ORDER — VITAMIN D (ERGOCALCIFEROL) 1.25 MG (50000 UNIT) PO CAPS: 50000.0000 [IU] | ORAL_CAPSULE | ORAL | 0 refills | Status: DC

## 2023-03-25 NOTE — Telephone Encounter (Signed)
Okay to refill? 

## 2023-03-26 ENCOUNTER — Encounter: Payer: Self-pay | Admitting: Internal Medicine

## 2023-03-26 DIAGNOSIS — S3210XD Unspecified fracture of sacrum, subsequent encounter for fracture with routine healing: Secondary | ICD-10-CM | POA: Diagnosis not present

## 2023-03-26 DIAGNOSIS — U071 COVID-19: Secondary | ICD-10-CM | POA: Diagnosis not present

## 2023-03-26 DIAGNOSIS — M1990 Unspecified osteoarthritis, unspecified site: Secondary | ICD-10-CM | POA: Diagnosis not present

## 2023-03-26 DIAGNOSIS — I5022 Chronic systolic (congestive) heart failure: Secondary | ICD-10-CM | POA: Diagnosis not present

## 2023-03-26 DIAGNOSIS — I110 Hypertensive heart disease with heart failure: Secondary | ICD-10-CM | POA: Diagnosis not present

## 2023-03-26 DIAGNOSIS — K589 Irritable bowel syndrome without diarrhea: Secondary | ICD-10-CM | POA: Diagnosis not present

## 2023-03-26 DIAGNOSIS — E440 Moderate protein-calorie malnutrition: Secondary | ICD-10-CM | POA: Diagnosis not present

## 2023-03-26 DIAGNOSIS — M48061 Spinal stenosis, lumbar region without neurogenic claudication: Secondary | ICD-10-CM | POA: Diagnosis not present

## 2023-03-26 DIAGNOSIS — I480 Paroxysmal atrial fibrillation: Secondary | ICD-10-CM | POA: Diagnosis not present

## 2023-03-27 ENCOUNTER — Other Ambulatory Visit: Payer: Self-pay | Admitting: *Deleted

## 2023-03-27 MED ORDER — CIPROFLOXACIN HCL 250 MG PO TABS: 250.0000 mg | ORAL_TABLET | Freq: Two times a day (BID) | ORAL | 0 refills | Status: DC

## 2023-03-31 ENCOUNTER — Other Ambulatory Visit: Payer: Self-pay | Admitting: Internal Medicine

## 2023-03-31 DIAGNOSIS — U071 COVID-19: Secondary | ICD-10-CM | POA: Diagnosis not present

## 2023-03-31 DIAGNOSIS — I110 Hypertensive heart disease with heart failure: Secondary | ICD-10-CM | POA: Diagnosis not present

## 2023-03-31 DIAGNOSIS — F339 Major depressive disorder, recurrent, unspecified: Secondary | ICD-10-CM

## 2023-03-31 DIAGNOSIS — S3210XD Unspecified fracture of sacrum, subsequent encounter for fracture with routine healing: Secondary | ICD-10-CM | POA: Diagnosis not present

## 2023-03-31 DIAGNOSIS — K589 Irritable bowel syndrome without diarrhea: Secondary | ICD-10-CM | POA: Diagnosis not present

## 2023-03-31 DIAGNOSIS — I480 Paroxysmal atrial fibrillation: Secondary | ICD-10-CM | POA: Diagnosis not present

## 2023-03-31 DIAGNOSIS — I5022 Chronic systolic (congestive) heart failure: Secondary | ICD-10-CM | POA: Diagnosis not present

## 2023-03-31 DIAGNOSIS — E440 Moderate protein-calorie malnutrition: Secondary | ICD-10-CM | POA: Diagnosis not present

## 2023-03-31 DIAGNOSIS — M48061 Spinal stenosis, lumbar region without neurogenic claudication: Secondary | ICD-10-CM | POA: Diagnosis not present

## 2023-03-31 DIAGNOSIS — M1990 Unspecified osteoarthritis, unspecified site: Secondary | ICD-10-CM | POA: Diagnosis not present

## 2023-04-01 ENCOUNTER — Telehealth: Payer: Self-pay | Admitting: Internal Medicine

## 2023-04-01 DIAGNOSIS — U071 COVID-19: Secondary | ICD-10-CM | POA: Diagnosis not present

## 2023-04-01 DIAGNOSIS — I110 Hypertensive heart disease with heart failure: Secondary | ICD-10-CM | POA: Diagnosis not present

## 2023-04-01 DIAGNOSIS — E440 Moderate protein-calorie malnutrition: Secondary | ICD-10-CM | POA: Diagnosis not present

## 2023-04-01 DIAGNOSIS — M48061 Spinal stenosis, lumbar region without neurogenic claudication: Secondary | ICD-10-CM | POA: Diagnosis not present

## 2023-04-01 DIAGNOSIS — S3210XD Unspecified fracture of sacrum, subsequent encounter for fracture with routine healing: Secondary | ICD-10-CM | POA: Diagnosis not present

## 2023-04-01 DIAGNOSIS — M1990 Unspecified osteoarthritis, unspecified site: Secondary | ICD-10-CM | POA: Diagnosis not present

## 2023-04-01 DIAGNOSIS — I5022 Chronic systolic (congestive) heart failure: Secondary | ICD-10-CM | POA: Diagnosis not present

## 2023-04-01 DIAGNOSIS — I480 Paroxysmal atrial fibrillation: Secondary | ICD-10-CM | POA: Diagnosis not present

## 2023-04-01 DIAGNOSIS — K589 Irritable bowel syndrome without diarrhea: Secondary | ICD-10-CM | POA: Diagnosis not present

## 2023-04-01 NOTE — Telephone Encounter (Signed)
FYI - Slow Heart Rate Report   Juliann Pares., RN // [facility] [phone]   FYI:   HR: 49  Pt does not feel bad at all  All vitals are normal    CMA was unavailable. Please return her call at your earliest convenience.

## 2023-04-01 NOTE — Telephone Encounter (Signed)
Informed Juliann Pares., RN to call the patient's cardiologist.

## 2023-04-07 ENCOUNTER — Ambulatory Visit: Payer: Medicare PPO

## 2023-04-07 ENCOUNTER — Telehealth: Payer: Self-pay

## 2023-04-07 NOTE — Telephone Encounter (Signed)
I called pt to confirm why she didn't show up for her lab appt today. She said she thought it was tomorrow. I informed her that I would move it to tomorrow but if she didn't come first thing tomorrow that we would have to reschedule her CT and her Ablation procedure.   She insured me that she would be here.

## 2023-04-08 ENCOUNTER — Ambulatory Visit: Payer: Medicare PPO | Attending: Cardiovascular Disease

## 2023-04-08 DIAGNOSIS — K589 Irritable bowel syndrome without diarrhea: Secondary | ICD-10-CM | POA: Diagnosis not present

## 2023-04-08 DIAGNOSIS — N179 Acute kidney failure, unspecified: Secondary | ICD-10-CM

## 2023-04-08 DIAGNOSIS — Z1321 Encounter for screening for nutritional disorder: Secondary | ICD-10-CM

## 2023-04-08 DIAGNOSIS — I5022 Chronic systolic (congestive) heart failure: Secondary | ICD-10-CM

## 2023-04-08 DIAGNOSIS — E7841 Elevated Lipoprotein(a): Secondary | ICD-10-CM | POA: Diagnosis not present

## 2023-04-08 DIAGNOSIS — S3210XD Unspecified fracture of sacrum, subsequent encounter for fracture with routine healing: Secondary | ICD-10-CM | POA: Diagnosis not present

## 2023-04-08 DIAGNOSIS — E440 Moderate protein-calorie malnutrition: Secondary | ICD-10-CM | POA: Diagnosis not present

## 2023-04-08 DIAGNOSIS — U071 COVID-19: Secondary | ICD-10-CM | POA: Diagnosis not present

## 2023-04-08 DIAGNOSIS — Z79899 Other long term (current) drug therapy: Secondary | ICD-10-CM | POA: Diagnosis not present

## 2023-04-08 DIAGNOSIS — I110 Hypertensive heart disease with heart failure: Secondary | ICD-10-CM | POA: Diagnosis not present

## 2023-04-08 DIAGNOSIS — I480 Paroxysmal atrial fibrillation: Secondary | ICD-10-CM | POA: Diagnosis not present

## 2023-04-08 DIAGNOSIS — M48061 Spinal stenosis, lumbar region without neurogenic claudication: Secondary | ICD-10-CM | POA: Diagnosis not present

## 2023-04-08 DIAGNOSIS — M1990 Unspecified osteoarthritis, unspecified site: Secondary | ICD-10-CM | POA: Diagnosis not present

## 2023-04-09 ENCOUNTER — Ambulatory Visit (HOSPITAL_COMMUNITY)
Admission: RE | Admit: 2023-04-09 | Discharge: 2023-04-09 | Disposition: A | Discharge status: Home / Self Care | Payer: Medicare PPO | Source: Ambulatory Visit | Attending: Cardiovascular Disease | Admitting: Cardiovascular Disease

## 2023-04-09 DIAGNOSIS — M1990 Unspecified osteoarthritis, unspecified site: Secondary | ICD-10-CM | POA: Diagnosis not present

## 2023-04-09 DIAGNOSIS — M48061 Spinal stenosis, lumbar region without neurogenic claudication: Secondary | ICD-10-CM | POA: Diagnosis not present

## 2023-04-09 DIAGNOSIS — I480 Paroxysmal atrial fibrillation: Secondary | ICD-10-CM | POA: Insufficient documentation

## 2023-04-09 DIAGNOSIS — K589 Irritable bowel syndrome without diarrhea: Secondary | ICD-10-CM | POA: Diagnosis not present

## 2023-04-09 DIAGNOSIS — I110 Hypertensive heart disease with heart failure: Secondary | ICD-10-CM | POA: Diagnosis not present

## 2023-04-09 DIAGNOSIS — U071 COVID-19: Secondary | ICD-10-CM | POA: Diagnosis not present

## 2023-04-09 DIAGNOSIS — S3210XD Unspecified fracture of sacrum, subsequent encounter for fracture with routine healing: Secondary | ICD-10-CM | POA: Diagnosis not present

## 2023-04-09 DIAGNOSIS — E440 Moderate protein-calorie malnutrition: Secondary | ICD-10-CM | POA: Diagnosis not present

## 2023-04-09 DIAGNOSIS — I5022 Chronic systolic (congestive) heart failure: Secondary | ICD-10-CM | POA: Diagnosis not present

## 2023-04-09 LAB — NMR, LIPOPROFILE
Cholesterol, Total: 167 mg/dL (ref 100–199)
HDL Particle Number: 38.5 umol/L (ref >=30.5)
HDL-C: 84 mg/dL (ref >39)
LDL Particle Number: 601 nmol/L (ref <1000)
LDL Size: 21.2 nm (ref >20.5)
LDL-C (NIH Calc): 72 mg/dL (ref 0–99)
LP-IR Score: 25 (ref <=45)
Small LDL Particle Number: <90 nmol/L (ref <=527)
Triglycerides: 55 mg/dL (ref 0–149)

## 2023-04-09 LAB — COMPREHENSIVE METABOLIC PANEL
ALT: 19 IU/L (ref 0–32)
AST: 21 IU/L (ref 0–40)
Albumin: 3.9 g/dL (ref 3.8–4.8)
Alkaline Phosphatase: 112 IU/L (ref 44–121)
BUN/Creatinine Ratio: 11 — ABNORMAL LOW (ref 12–28)
BUN: 14 mg/dL (ref 8–27)
Bilirubin Total: 0.4 mg/dL (ref 0.0–1.2)
CO2: 22 mmol/L (ref 20–29)
Calcium: 9.5 mg/dL (ref 8.7–10.3)
Chloride: 101 mmol/L (ref 96–106)
Creatinine, Ser: 1.24 mg/dL — ABNORMAL HIGH (ref 0.57–1.00)
Globulin, Total: 1.9 g/dL (ref 1.5–4.5)
Glucose: 129 mg/dL — ABNORMAL HIGH (ref 70–99)
Potassium: 4.5 mmol/L (ref 3.5–5.2)
Sodium: 137 mmol/L (ref 134–144)
Total Protein: 5.8 g/dL — ABNORMAL LOW (ref 6.0–8.5)
eGFR: 46 mL/min/{1.73_m2} — ABNORMAL LOW (ref >59)

## 2023-04-09 LAB — CBC
Hematocrit: 34.6 % (ref 34.0–46.6)
Hemoglobin: 10.8 g/dL — ABNORMAL LOW (ref 11.1–15.9)
MCH: 29.7 pg (ref 26.6–33.0)
MCHC: 31.2 g/dL — ABNORMAL LOW (ref 31.5–35.7)
MCV: 95 fL (ref 79–97)
Platelets: 331 10*3/uL (ref 150–450)
RBC: 3.64 x10E6/uL — ABNORMAL LOW (ref 3.77–5.28)
RDW: 12.2 % (ref 11.7–15.4)
WBC: 5.9 10*3/uL (ref 3.4–10.8)

## 2023-04-09 LAB — VITAMIN D 25 HYDROXY (VIT D DEFICIENCY, FRACTURES): Vit D, 25-Hydroxy: 36.5 ng/mL (ref 30.0–100.0)

## 2023-04-09 MED ORDER — IOHEXOL 350 MG/ML SOLN
100.0000 mL | Freq: Once | INTRAVENOUS | Status: AC | PRN
  Administered 2023-04-09: 100 mL via INTRAVENOUS

## 2023-04-10 ENCOUNTER — Telehealth: Payer: Self-pay

## 2023-04-10 ENCOUNTER — Ambulatory Visit: Payer: Medicare PPO | Admitting: Cardiovascular Disease

## 2023-04-10 NOTE — Telephone Encounter (Signed)
Spoke with patient, she had forgotten that she was suppose to hold her faxiga for the 3 days prior to ablation. Cancelled her ablation for tomorrow and will put her on the wait list for any further openings.

## 2023-04-10 NOTE — Pre-Procedure Instructions (Signed)
Attempted to call patient regarding procedure instructions.  Left voicemail on the following items: Arrival time 0530 Nothing to eat or drink after midnight No meds AM of procedure Responsible person to drive you home and stay with you for 24 hrs  Have you missed any doses of anti-coagulant Eliquis- should be taken twice a day, if you have missed any doses please let us know.  Don't take any medications in the morning.

## 2023-04-11 ENCOUNTER — Ambulatory Visit (HOSPITAL_COMMUNITY): Admission: RE | Payer: Medicare PPO | Source: Home / Self Care

## 2023-04-11 ENCOUNTER — Ambulatory Visit (HOSPITAL_BASED_OUTPATIENT_CLINIC_OR_DEPARTMENT_OTHER): Payer: Medicare PPO | Admitting: Anesthesiology

## 2023-04-11 ENCOUNTER — Encounter (HOSPITAL_COMMUNITY): Payer: Self-pay | Admitting: Cardiovascular Disease

## 2023-04-11 ENCOUNTER — Encounter (HOSPITAL_COMMUNITY)
Admission: RE | Disposition: A | Discharge status: Home / Self Care | Payer: Self-pay | Source: Home / Self Care | Attending: Cardiovascular Disease

## 2023-04-11 ENCOUNTER — Encounter: Payer: Self-pay | Admitting: Internal Medicine

## 2023-04-11 ENCOUNTER — Ambulatory Visit (HOSPITAL_COMMUNITY): Admission: RE | Admit: 2023-04-11 | Payer: Medicare PPO | Source: Home / Self Care | Admitting: Cardiovascular Disease

## 2023-04-11 ENCOUNTER — Ambulatory Visit (HOSPITAL_COMMUNITY)
Admission: RE | Admit: 2023-04-11 | Discharge: 2023-04-11 | Disposition: A | Discharge status: Home / Self Care | Payer: Medicare PPO | Attending: Cardiovascular Disease | Admitting: Cardiovascular Disease

## 2023-04-11 ENCOUNTER — Ambulatory Visit (HOSPITAL_COMMUNITY): Payer: Medicare PPO | Admitting: Anesthesiology

## 2023-04-11 ENCOUNTER — Other Ambulatory Visit: Payer: Self-pay

## 2023-04-11 DIAGNOSIS — I5022 Chronic systolic (congestive) heart failure: Secondary | ICD-10-CM | POA: Diagnosis not present

## 2023-04-11 DIAGNOSIS — X58XXXD Exposure to other specified factors, subsequent encounter: Secondary | ICD-10-CM | POA: Diagnosis not present

## 2023-04-11 DIAGNOSIS — I4891 Unspecified atrial fibrillation: Secondary | ICD-10-CM | POA: Diagnosis not present

## 2023-04-11 DIAGNOSIS — I4819 Other persistent atrial fibrillation: Secondary | ICD-10-CM | POA: Diagnosis not present

## 2023-04-11 DIAGNOSIS — I509 Heart failure, unspecified: Secondary | ICD-10-CM | POA: Diagnosis not present

## 2023-04-11 DIAGNOSIS — F900 Attention-deficit hyperactivity disorder, predominantly inattentive type: Secondary | ICD-10-CM | POA: Diagnosis not present

## 2023-04-11 DIAGNOSIS — N289 Disorder of kidney and ureter, unspecified: Secondary | ICD-10-CM | POA: Insufficient documentation

## 2023-04-11 DIAGNOSIS — I110 Hypertensive heart disease with heart failure: Secondary | ICD-10-CM | POA: Diagnosis not present

## 2023-04-11 DIAGNOSIS — Z7901 Long term (current) use of anticoagulants: Secondary | ICD-10-CM | POA: Insufficient documentation

## 2023-04-11 DIAGNOSIS — Z9884 Bariatric surgery status: Secondary | ICD-10-CM | POA: Insufficient documentation

## 2023-04-11 DIAGNOSIS — Z87891 Personal history of nicotine dependence: Secondary | ICD-10-CM | POA: Diagnosis not present

## 2023-04-11 DIAGNOSIS — N390 Urinary tract infection, site not specified: Secondary | ICD-10-CM | POA: Insufficient documentation

## 2023-04-11 DIAGNOSIS — N189 Chronic kidney disease, unspecified: Secondary | ICD-10-CM

## 2023-04-11 DIAGNOSIS — I130 Hypertensive heart and chronic kidney disease with heart failure and stage 1 through stage 4 chronic kidney disease, or unspecified chronic kidney disease: Secondary | ICD-10-CM | POA: Diagnosis not present

## 2023-04-11 DIAGNOSIS — S3219XG Other fracture of sacrum, subsequent encounter for fracture with delayed healing: Secondary | ICD-10-CM | POA: Diagnosis not present

## 2023-04-11 DIAGNOSIS — Z8249 Family history of ischemic heart disease and other diseases of the circulatory system: Secondary | ICD-10-CM | POA: Insufficient documentation

## 2023-04-11 HISTORY — PX: ATRIAL FIBRILLATION ABLATION: EP1191

## 2023-04-11 LAB — POCT ACTIVATED CLOTTING TIME: Activated Clotting Time: 330 seconds

## 2023-04-11 SURGERY — ATRIAL FIBRILLATION ABLATION
Anesthesia: General

## 2023-04-11 MED ORDER — PHENYLEPHRINE 80 MCG/ML (10ML) SYRINGE FOR IV PUSH (FOR BLOOD PRESSURE SUPPORT)
PREFILLED_SYRINGE | INTRAVENOUS | Status: DC | PRN
  Administered 2023-04-11: 80 ug via INTRAVENOUS

## 2023-04-11 MED ORDER — SODIUM CHLORIDE 0.9% FLUSH: 3.0000 mL | Freq: Two times a day (BID) | INTRAVENOUS | Status: DC

## 2023-04-11 MED ORDER — LIDOCAINE 2% (20 MG/ML) 5 ML SYRINGE
INTRAMUSCULAR | Status: DC | PRN
  Administered 2023-04-11: 60 mg via INTRAVENOUS

## 2023-04-11 MED ORDER — SUGAMMADEX SODIUM 200 MG/2ML IV SOLN
INTRAVENOUS | Status: DC | PRN
  Administered 2023-04-11: 50 mg via INTRAVENOUS

## 2023-04-11 MED ORDER — ONDANSETRON HCL 4 MG/2ML IJ SOLN: 4.0000 mg | Freq: Four times a day (QID) | INTRAMUSCULAR | Status: DC | PRN

## 2023-04-11 MED ORDER — ACETAMINOPHEN 325 MG PO TABS: 650.0000 mg | ORAL_TABLET | ORAL | Status: DC | PRN

## 2023-04-11 MED ORDER — FENTANYL CITRATE (PF) 100 MCG/2ML IJ SOLN
INTRAMUSCULAR | Status: AC
  Filled 2023-04-11: qty 2

## 2023-04-11 MED ORDER — DOBUTAMINE INFUSION FOR EP/ECHO/NUC (1000 MCG/ML)
INTRAVENOUS | Status: AC
  Filled 2023-04-11: qty 250

## 2023-04-11 MED ORDER — HEPARIN SODIUM (PORCINE) 1000 UNIT/ML IJ SOLN
INTRAMUSCULAR | Status: AC
  Filled 2023-04-11: qty 10

## 2023-04-11 MED ORDER — FENTANYL CITRATE (PF) 250 MCG/5ML IJ SOLN
INTRAMUSCULAR | Status: DC | PRN
  Administered 2023-04-11 (×2): 50 ug via INTRAVENOUS

## 2023-04-11 MED ORDER — PHENYLEPHRINE HCL-NACL 20-0.9 MG/250ML-% IV SOLN
INTRAVENOUS | Status: DC | PRN
  Administered 2023-04-11: 20 ug/min via INTRAVENOUS

## 2023-04-11 MED ORDER — DEXAMETHASONE SODIUM PHOSPHATE 10 MG/ML IJ SOLN
INTRAMUSCULAR | Status: DC | PRN
  Administered 2023-04-11: 10 mg via INTRAVENOUS

## 2023-04-11 MED ORDER — SODIUM CHLORIDE 0.9 % IV SOLN: INTRAVENOUS | Status: DC

## 2023-04-11 MED ORDER — HEPARIN SODIUM (PORCINE) 1000 UNIT/ML IJ SOLN
INTRAMUSCULAR | Status: DC | PRN
Start: 2023-04-11 — End: 2023-04-11
  Administered 2023-04-11: 14000 [IU] via INTRAVENOUS

## 2023-04-11 MED ORDER — SODIUM CHLORIDE 0.9% FLUSH: 3.0000 mL | INTRAVENOUS | Status: DC | PRN

## 2023-04-11 MED ORDER — ROCURONIUM BROMIDE 10 MG/ML (PF) SYRINGE
PREFILLED_SYRINGE | INTRAVENOUS | Status: DC | PRN
  Administered 2023-04-11: 20 mg via INTRAVENOUS
  Administered 2023-04-11: 40 mg via INTRAVENOUS

## 2023-04-11 MED ORDER — EPHEDRINE SULFATE-NACL 50-0.9 MG/10ML-% IV SOSY
PREFILLED_SYRINGE | INTRAVENOUS | Status: DC | PRN
  Administered 2023-04-11: 5 mg via INTRAVENOUS

## 2023-04-11 MED ORDER — ONDANSETRON HCL 4 MG/2ML IJ SOLN
INTRAMUSCULAR | Status: DC | PRN
  Administered 2023-04-11: 4 mg via INTRAVENOUS

## 2023-04-11 MED ORDER — SODIUM CHLORIDE 0.9 % IV SOLN: 250.0000 mL | INTRAVENOUS | Status: DC | PRN

## 2023-04-11 MED ORDER — HEPARIN (PORCINE) IN NACL 1000-0.9 UT/500ML-% IV SOLN
INTRAVENOUS | Status: DC | PRN
  Administered 2023-04-11 (×4): 500 mL

## 2023-04-11 MED ORDER — ATROPINE SULFATE 1 MG/10ML IJ SOSY
PREFILLED_SYRINGE | INTRAMUSCULAR | Status: AC
  Filled 2023-04-11: qty 10

## 2023-04-11 MED ORDER — DOBUTAMINE INFUSION FOR EP/ECHO/NUC (1000 MCG/ML)
INTRAVENOUS | Status: DC | PRN
Start: 2023-04-11 — End: 2023-04-11
  Administered 2023-04-11: 20 ug/kg/min via INTRAVENOUS

## 2023-04-11 MED ORDER — PROPOFOL 10 MG/ML IV BOLUS
INTRAVENOUS | Status: DC | PRN
  Administered 2023-04-11: 100 mg via INTRAVENOUS

## 2023-04-11 MED ORDER — PROTAMINE SULFATE 10 MG/ML IV SOLN
INTRAVENOUS | Status: DC | PRN
Start: 2023-04-11 — End: 2023-04-11
  Administered 2023-04-11: 45 mg via INTRAVENOUS
  Administered 2023-04-11: 5 mg via INTRAVENOUS

## 2023-04-11 SURGICAL SUPPLY — 20 items
BLANKET WARM UNDERBOD FULL ACC (MISCELLANEOUS) ×2 IMPLANT
CATH 8FR REPROCESSED SOUNDSTAR (CATHETERS) ×1 IMPLANT
CATH 8FR SOUNDSTAR REPROCESSED (CATHETERS) IMPLANT
CATH ABLAT QDOT MICRO BI TC FJ (CATHETERS) IMPLANT
CATH OCTARAY 2.0 F 3-3-3-3-3 (CATHETERS) IMPLANT
CATH PIGTAIL STEERABLE D1 8.7 (WIRE) IMPLANT
CATH S-M CIRCA TEMP PROBE (CATHETERS) IMPLANT
CATH WEBSTER BI DIR CS D-F CRV (CATHETERS) IMPLANT
CLOSURE PERCLOSE PROSTYLE (VASCULAR PRODUCTS) IMPLANT
COVER SWIFTLINK CONNECTOR (BAG) ×2 IMPLANT
DEVICE CLOSURE MYNXGRIP 6/7F (Vascular Products) IMPLANT
PACK EP LATEX FREE (CUSTOM PROCEDURE TRAY) ×1
PACK EP LF (CUSTOM PROCEDURE TRAY) ×2 IMPLANT
PAD DEFIB RADIO PHYSIO CONN (PAD) ×2 IMPLANT
PATCH CARTO3 (PAD) IMPLANT
SHEATH CARTO VIZIGO MED CURVE (SHEATH) IMPLANT
SHEATH PINNACLE 8F 10CM (SHEATH) IMPLANT
SHEATH PINNACLE 9F 10CM (SHEATH) IMPLANT
SHEATH PROBE COVER 6X72 (BAG) IMPLANT
TUBING SMART ABLATE COOLFLOW (TUBING) IMPLANT

## 2023-04-11 NOTE — Transfer of Care (Signed)
Immediate Anesthesia Transfer of Care Note  Patient: Leslie [name]  Procedure(s) Performed: ATRIAL FIBRILLATION ABLATION  Patient Location: PACU  Anesthesia Type:General  Level of Consciousness: awake, alert , and oriented  Airway & Oxygen Therapy: Patient Spontanous Breathing and Patient connected to nasal cannula oxygen  Post-op Assessment: Report given to RN and Post -op Vital signs reviewed and stable  Post vital signs: Reviewed and stable  Last Vitals:  Vitals Value Taken Time  BP 109/88 04/11/23 1610  Temp 36.6 C 04/11/23 1557  Pulse 56 04/11/23 1613  Resp 17 04/11/23 1613  SpO2 86 % 04/11/23 1613  Vitals shown include unfiled device data.  Last Pain:  Vitals:   04/11/23 1557  TempSrc: Temporal  PainSc: 0-No pain         Complications: No notable events documented.

## 2023-04-11 NOTE — Anesthesia Procedure Notes (Signed)
Procedure Name: Intubation Date/Time: 04/11/2023 1:57 PM  Performed by: Camillia Herter, CRNAPre-anesthesia Checklist: Patient identified, Emergency Drugs available, Suction available and Patient being monitored Patient Re-evaluated:Patient Re-evaluated prior to induction Oxygen Delivery Method: Circle System Utilized Preoxygenation: Pre-oxygenation with 100% oxygen Induction Type: IV induction Ventilation: Mask ventilation without difficulty Laryngoscope Size: Mac and 3 Grade View: Grade I Tube type: Oral Tube size: 7.0 mm Number of attempts: 1 Airway Equipment and Method: Stylet and Oral airway Placement Confirmation: ETT inserted through vocal cords under direct vision, positive ETCO2 and breath sounds checked- equal and bilateral Tube secured with: Tape Dental Injury: Teeth and Oropharynx as per pre-operative assessment  Comments: Atraumatic, Pt with poor dentition per baseline; unchanged.

## 2023-04-11 NOTE — Discharge Instructions (Addendum)

## 2023-04-11 NOTE — Interval H&P Note (Signed)
History and Physical Interval Note:  04/11/2023 1:28 PM  Leslie [name]  has presented today for surgery, with the diagnosis of atrial fibrillation.  The various methods of treatment have been discussed with the patient and family. After consideration of risks, benefits and other options for treatment, the patient has consented to  Procedure(s): ATRIAL FIBRILLATION ABLATION (N/A) as a surgical intervention.  The patient's history has been reviewed, patient examined, no change in status, stable for surgery.  I have reviewed the patient's chart and labs.  Questions were answered to the patient's satisfaction.     Roberts Gaudy Lanny Lipkin

## 2023-04-11 NOTE — Anesthesia Preprocedure Evaluation (Signed)
Anesthesia Evaluation  Patient identified by MRN, date of birth, ID band Patient awake    Reviewed: Allergy & Precautions, NPO status , Patient's Chart, lab work & pertinent test results, reviewed documented beta blocker date and time   History of Anesthesia Complications Negative for: history of anesthetic complications  Airway Mallampati: III  TM Distance: >3 FB Neck ROM: Full    Dental  (+) Loose,    Pulmonary neg COPD, former smoker, neg PE   breath sounds clear to auscultation       Cardiovascular hypertension, (-) angina +CHF  (-) CAD + dysrhythmias Atrial Fibrillation  Rhythm:Irregular Rate:Normal     Neuro/Psych neg Seizures PSYCHIATRIC DISORDERS         GI/Hepatic ,neg GERD  ,,(+) neg Cirrhosis        Endo/Other  diabetes    Renal/GU CRFRenal disease     Musculoskeletal  (+) Arthritis ,    Abdominal   Peds  Hematology  (+) Blood dyscrasia, anemia   Anesthesia Other Findings   Reproductive/Obstetrics                              Anesthesia Physical Anesthesia Plan  ASA: 3  Anesthesia Plan: General   Post-op Pain Management:    Induction: Intravenous  PONV Risk Score and Plan: 2 and Ondansetron  Airway Management Planned: Oral ETT  Additional Equipment:   Intra-op Plan:   Post-operative Plan: Extubation in OR  Informed Consent: I have reviewed the patients History and Physical, chart, labs and discussed the procedure including the risks, benefits and alternatives for the proposed anesthesia with the patient or authorized representative who has indicated his/her understanding and acceptance.     Dental advisory given  Plan Discussed with: CRNA  Anesthesia Plan Comments:          Anesthesia Quick Evaluation

## 2023-04-14 DIAGNOSIS — I480 Paroxysmal atrial fibrillation: Secondary | ICD-10-CM | POA: Diagnosis not present

## 2023-04-14 DIAGNOSIS — U071 COVID-19: Secondary | ICD-10-CM | POA: Diagnosis not present

## 2023-04-14 DIAGNOSIS — S3210XD Unspecified fracture of sacrum, subsequent encounter for fracture with routine healing: Secondary | ICD-10-CM | POA: Diagnosis not present

## 2023-04-14 DIAGNOSIS — M1990 Unspecified osteoarthritis, unspecified site: Secondary | ICD-10-CM | POA: Diagnosis not present

## 2023-04-14 DIAGNOSIS — K589 Irritable bowel syndrome without diarrhea: Secondary | ICD-10-CM | POA: Diagnosis not present

## 2023-04-14 DIAGNOSIS — E440 Moderate protein-calorie malnutrition: Secondary | ICD-10-CM | POA: Diagnosis not present

## 2023-04-14 DIAGNOSIS — I5022 Chronic systolic (congestive) heart failure: Secondary | ICD-10-CM | POA: Diagnosis not present

## 2023-04-14 DIAGNOSIS — M48061 Spinal stenosis, lumbar region without neurogenic claudication: Secondary | ICD-10-CM | POA: Diagnosis not present

## 2023-04-14 DIAGNOSIS — I110 Hypertensive heart disease with heart failure: Secondary | ICD-10-CM | POA: Diagnosis not present

## 2023-04-15 ENCOUNTER — Other Ambulatory Visit: Payer: Self-pay | Admitting: Internal Medicine

## 2023-04-15 DIAGNOSIS — K589 Irritable bowel syndrome without diarrhea: Secondary | ICD-10-CM | POA: Diagnosis not present

## 2023-04-15 DIAGNOSIS — R262 Difficulty in walking, not elsewhere classified: Secondary | ICD-10-CM | POA: Diagnosis not present

## 2023-04-15 DIAGNOSIS — M48061 Spinal stenosis, lumbar region without neurogenic claudication: Secondary | ICD-10-CM | POA: Diagnosis not present

## 2023-04-15 DIAGNOSIS — S3210XD Unspecified fracture of sacrum, subsequent encounter for fracture with routine healing: Secondary | ICD-10-CM | POA: Diagnosis not present

## 2023-04-15 DIAGNOSIS — I5022 Chronic systolic (congestive) heart failure: Secondary | ICD-10-CM | POA: Diagnosis not present

## 2023-04-15 DIAGNOSIS — G479 Sleep disorder, unspecified: Secondary | ICD-10-CM

## 2023-04-15 DIAGNOSIS — I110 Hypertensive heart disease with heart failure: Secondary | ICD-10-CM | POA: Diagnosis not present

## 2023-04-15 DIAGNOSIS — M1990 Unspecified osteoarthritis, unspecified site: Secondary | ICD-10-CM | POA: Diagnosis not present

## 2023-04-15 DIAGNOSIS — E440 Moderate protein-calorie malnutrition: Secondary | ICD-10-CM | POA: Diagnosis not present

## 2023-04-15 DIAGNOSIS — I480 Paroxysmal atrial fibrillation: Secondary | ICD-10-CM | POA: Diagnosis not present

## 2023-04-15 DIAGNOSIS — U071 COVID-19: Secondary | ICD-10-CM | POA: Diagnosis not present

## 2023-04-15 MED ORDER — ALPRAZOLAM 0.25 MG PO TABS: 0.2500 mg | ORAL_TABLET | Freq: Every evening | ORAL | 0 refills | Status: DC | PRN

## 2023-04-15 MED FILL — Fentanyl Citrate Preservative Free (PF) Inj 100 MCG/2ML: INTRAMUSCULAR | Qty: 2 | Status: AC

## 2023-04-15 NOTE — Anesthesia Postprocedure Evaluation (Signed)
Anesthesia Post Note  Patient: Leslie [name]  Procedure(s) Performed: ATRIAL FIBRILLATION ABLATION     Patient location during evaluation: Cath Lab Anesthesia Type: General Level of consciousness: awake and alert Pain management: pain level controlled Vital Signs Assessment: post-procedure vital signs reviewed and stable Respiratory status: spontaneous breathing, nonlabored ventilation and respiratory function stable Cardiovascular status: blood pressure returned to baseline and stable Postop Assessment: no apparent nausea or vomiting Anesthetic complications: no   No notable events documented.  Last Vitals:  Vitals:   04/11/23 1900 04/11/23 1945  BP: (!) 89/65 99/64  Pulse: (!) 58   Resp: 16   Temp:    SpO2: 99%     Last Pain:  Vitals:   04/11/23 1945  TempSrc:   PainSc: 0-No pain   Pain Goal:                   [name]

## 2023-04-16 ENCOUNTER — Telehealth: Payer: Self-pay | Admitting: Internal Medicine

## 2023-04-16 DIAGNOSIS — E440 Moderate protein-calorie malnutrition: Secondary | ICD-10-CM | POA: Diagnosis not present

## 2023-04-16 DIAGNOSIS — K589 Irritable bowel syndrome without diarrhea: Secondary | ICD-10-CM | POA: Diagnosis not present

## 2023-04-16 DIAGNOSIS — I480 Paroxysmal atrial fibrillation: Secondary | ICD-10-CM | POA: Diagnosis not present

## 2023-04-16 DIAGNOSIS — U071 COVID-19: Secondary | ICD-10-CM | POA: Diagnosis not present

## 2023-04-16 DIAGNOSIS — S3210XD Unspecified fracture of sacrum, subsequent encounter for fracture with routine healing: Secondary | ICD-10-CM | POA: Diagnosis not present

## 2023-04-16 DIAGNOSIS — I110 Hypertensive heart disease with heart failure: Secondary | ICD-10-CM | POA: Diagnosis not present

## 2023-04-16 DIAGNOSIS — M48061 Spinal stenosis, lumbar region without neurogenic claudication: Secondary | ICD-10-CM | POA: Diagnosis not present

## 2023-04-16 DIAGNOSIS — I5022 Chronic systolic (congestive) heart failure: Secondary | ICD-10-CM | POA: Diagnosis not present

## 2023-04-16 DIAGNOSIS — M1990 Unspecified osteoarthritis, unspecified site: Secondary | ICD-10-CM | POA: Diagnosis not present

## 2023-04-16 NOTE — Telephone Encounter (Signed)
Rosanne Ashing from [facility] call and stated he need a verbal order of OT 1x a wk x8 Jim's # is [phone].

## 2023-04-16 NOTE — Telephone Encounter (Signed)
Okay to give orders?

## 2023-04-16 NOTE — Telephone Encounter (Signed)
Left message on machine for  Jim from [facility]  For OT.

## 2023-04-17 DIAGNOSIS — I480 Paroxysmal atrial fibrillation: Secondary | ICD-10-CM | POA: Diagnosis not present

## 2023-04-17 DIAGNOSIS — M48061 Spinal stenosis, lumbar region without neurogenic claudication: Secondary | ICD-10-CM | POA: Diagnosis not present

## 2023-04-17 DIAGNOSIS — I5022 Chronic systolic (congestive) heart failure: Secondary | ICD-10-CM | POA: Diagnosis not present

## 2023-04-17 DIAGNOSIS — S3210XD Unspecified fracture of sacrum, subsequent encounter for fracture with routine healing: Secondary | ICD-10-CM | POA: Diagnosis not present

## 2023-04-17 DIAGNOSIS — I110 Hypertensive heart disease with heart failure: Secondary | ICD-10-CM | POA: Diagnosis not present

## 2023-04-17 DIAGNOSIS — E440 Moderate protein-calorie malnutrition: Secondary | ICD-10-CM | POA: Diagnosis not present

## 2023-04-17 DIAGNOSIS — M1990 Unspecified osteoarthritis, unspecified site: Secondary | ICD-10-CM | POA: Diagnosis not present

## 2023-04-17 DIAGNOSIS — K589 Irritable bowel syndrome without diarrhea: Secondary | ICD-10-CM | POA: Diagnosis not present

## 2023-04-17 DIAGNOSIS — U071 COVID-19: Secondary | ICD-10-CM | POA: Diagnosis not present

## 2023-04-22 DIAGNOSIS — G5702 Lesion of sciatic nerve, left lower limb: Secondary | ICD-10-CM | POA: Diagnosis not present

## 2023-04-23 DIAGNOSIS — E440 Moderate protein-calorie malnutrition: Secondary | ICD-10-CM | POA: Diagnosis not present

## 2023-04-23 DIAGNOSIS — I5022 Chronic systolic (congestive) heart failure: Secondary | ICD-10-CM | POA: Diagnosis not present

## 2023-04-23 DIAGNOSIS — I110 Hypertensive heart disease with heart failure: Secondary | ICD-10-CM | POA: Diagnosis not present

## 2023-04-23 DIAGNOSIS — M48061 Spinal stenosis, lumbar region without neurogenic claudication: Secondary | ICD-10-CM | POA: Diagnosis not present

## 2023-04-23 DIAGNOSIS — M1990 Unspecified osteoarthritis, unspecified site: Secondary | ICD-10-CM | POA: Diagnosis not present

## 2023-04-23 DIAGNOSIS — I480 Paroxysmal atrial fibrillation: Secondary | ICD-10-CM | POA: Diagnosis not present

## 2023-04-23 DIAGNOSIS — K589 Irritable bowel syndrome without diarrhea: Secondary | ICD-10-CM | POA: Diagnosis not present

## 2023-04-23 DIAGNOSIS — U071 COVID-19: Secondary | ICD-10-CM | POA: Diagnosis not present

## 2023-04-23 DIAGNOSIS — S3210XD Unspecified fracture of sacrum, subsequent encounter for fracture with routine healing: Secondary | ICD-10-CM | POA: Diagnosis not present

## 2023-04-24 ENCOUNTER — Other Ambulatory Visit: Payer: Self-pay | Admitting: Internal Medicine

## 2023-04-24 DIAGNOSIS — S3219XG Other fracture of sacrum, subsequent encounter for fracture with delayed healing: Secondary | ICD-10-CM

## 2023-04-25 ENCOUNTER — Other Ambulatory Visit: Payer: Self-pay | Admitting: Internal Medicine

## 2023-04-25 DIAGNOSIS — Z1212 Encounter for screening for malignant neoplasm of rectum: Secondary | ICD-10-CM

## 2023-04-25 DIAGNOSIS — Z1211 Encounter for screening for malignant neoplasm of colon: Secondary | ICD-10-CM

## 2023-04-28 DIAGNOSIS — E440 Moderate protein-calorie malnutrition: Secondary | ICD-10-CM | POA: Diagnosis not present

## 2023-04-28 DIAGNOSIS — U071 COVID-19: Secondary | ICD-10-CM | POA: Diagnosis not present

## 2023-04-28 DIAGNOSIS — K589 Irritable bowel syndrome without diarrhea: Secondary | ICD-10-CM | POA: Diagnosis not present

## 2023-04-28 DIAGNOSIS — M1990 Unspecified osteoarthritis, unspecified site: Secondary | ICD-10-CM | POA: Diagnosis not present

## 2023-04-28 DIAGNOSIS — S3210XD Unspecified fracture of sacrum, subsequent encounter for fracture with routine healing: Secondary | ICD-10-CM | POA: Diagnosis not present

## 2023-04-28 DIAGNOSIS — M48061 Spinal stenosis, lumbar region without neurogenic claudication: Secondary | ICD-10-CM | POA: Diagnosis not present

## 2023-04-28 DIAGNOSIS — Z5189 Encounter for other specified aftercare: Secondary | ICD-10-CM | POA: Diagnosis not present

## 2023-04-28 DIAGNOSIS — I480 Paroxysmal atrial fibrillation: Secondary | ICD-10-CM | POA: Diagnosis not present

## 2023-04-28 DIAGNOSIS — I5022 Chronic systolic (congestive) heart failure: Secondary | ICD-10-CM | POA: Diagnosis not present

## 2023-04-28 DIAGNOSIS — I110 Hypertensive heart disease with heart failure: Secondary | ICD-10-CM | POA: Diagnosis not present

## 2023-04-29 DIAGNOSIS — S3210XD Unspecified fracture of sacrum, subsequent encounter for fracture with routine healing: Secondary | ICD-10-CM | POA: Diagnosis not present

## 2023-04-29 DIAGNOSIS — E440 Moderate protein-calorie malnutrition: Secondary | ICD-10-CM | POA: Diagnosis not present

## 2023-04-29 DIAGNOSIS — I110 Hypertensive heart disease with heart failure: Secondary | ICD-10-CM | POA: Diagnosis not present

## 2023-04-29 DIAGNOSIS — K589 Irritable bowel syndrome without diarrhea: Secondary | ICD-10-CM | POA: Diagnosis not present

## 2023-04-29 DIAGNOSIS — M1990 Unspecified osteoarthritis, unspecified site: Secondary | ICD-10-CM | POA: Diagnosis not present

## 2023-04-29 DIAGNOSIS — I5022 Chronic systolic (congestive) heart failure: Secondary | ICD-10-CM | POA: Diagnosis not present

## 2023-04-29 DIAGNOSIS — M48061 Spinal stenosis, lumbar region without neurogenic claudication: Secondary | ICD-10-CM | POA: Diagnosis not present

## 2023-04-29 DIAGNOSIS — I480 Paroxysmal atrial fibrillation: Secondary | ICD-10-CM | POA: Diagnosis not present

## 2023-04-29 DIAGNOSIS — U071 COVID-19: Secondary | ICD-10-CM | POA: Diagnosis not present

## 2023-04-30 DIAGNOSIS — U071 COVID-19: Secondary | ICD-10-CM | POA: Diagnosis not present

## 2023-04-30 DIAGNOSIS — I5022 Chronic systolic (congestive) heart failure: Secondary | ICD-10-CM | POA: Diagnosis not present

## 2023-04-30 DIAGNOSIS — F32A Depression, unspecified: Secondary | ICD-10-CM

## 2023-04-30 DIAGNOSIS — I480 Paroxysmal atrial fibrillation: Secondary | ICD-10-CM | POA: Diagnosis not present

## 2023-04-30 DIAGNOSIS — K589 Irritable bowel syndrome without diarrhea: Secondary | ICD-10-CM | POA: Diagnosis not present

## 2023-04-30 DIAGNOSIS — D649 Anemia, unspecified: Secondary | ICD-10-CM

## 2023-04-30 DIAGNOSIS — M1990 Unspecified osteoarthritis, unspecified site: Secondary | ICD-10-CM | POA: Diagnosis not present

## 2023-04-30 DIAGNOSIS — F419 Anxiety disorder, unspecified: Secondary | ICD-10-CM

## 2023-04-30 DIAGNOSIS — E440 Moderate protein-calorie malnutrition: Secondary | ICD-10-CM | POA: Diagnosis not present

## 2023-04-30 DIAGNOSIS — S3210XD Unspecified fracture of sacrum, subsequent encounter for fracture with routine healing: Secondary | ICD-10-CM | POA: Diagnosis not present

## 2023-04-30 DIAGNOSIS — M48061 Spinal stenosis, lumbar region without neurogenic claudication: Secondary | ICD-10-CM | POA: Diagnosis not present

## 2023-04-30 DIAGNOSIS — I110 Hypertensive heart disease with heart failure: Secondary | ICD-10-CM | POA: Diagnosis not present

## 2023-05-02 ENCOUNTER — Other Ambulatory Visit (HOSPITAL_COMMUNITY): Payer: Self-pay

## 2023-05-02 ENCOUNTER — Encounter (HOSPITAL_COMMUNITY): Payer: Self-pay | Admitting: Cardiology

## 2023-05-02 ENCOUNTER — Ambulatory Visit (HOSPITAL_COMMUNITY)
Admission: RE | Admit: 2023-05-02 | Discharge: 2023-05-02 | Disposition: A | Discharge status: Home / Self Care | Payer: Medicare PPO | Source: Ambulatory Visit | Attending: Cardiology | Admitting: Cardiology

## 2023-05-02 VITALS — BP 110/62 | HR 50 | Wt 182.8 lb

## 2023-05-02 DIAGNOSIS — I480 Paroxysmal atrial fibrillation: Secondary | ICD-10-CM | POA: Diagnosis not present

## 2023-05-02 DIAGNOSIS — R29898 Other symptoms and signs involving the musculoskeletal system: Secondary | ICD-10-CM

## 2023-05-02 DIAGNOSIS — G629 Polyneuropathy, unspecified: Secondary | ICD-10-CM | POA: Diagnosis not present

## 2023-05-02 DIAGNOSIS — Z79899 Other long term (current) drug therapy: Secondary | ICD-10-CM | POA: Diagnosis not present

## 2023-05-02 DIAGNOSIS — Z7984 Long term (current) use of oral hypoglycemic drugs: Secondary | ICD-10-CM | POA: Diagnosis not present

## 2023-05-02 DIAGNOSIS — I110 Hypertensive heart disease with heart failure: Secondary | ICD-10-CM | POA: Diagnosis not present

## 2023-05-02 DIAGNOSIS — Z993 Dependence on wheelchair: Secondary | ICD-10-CM | POA: Insufficient documentation

## 2023-05-02 DIAGNOSIS — Z7901 Long term (current) use of anticoagulants: Secondary | ICD-10-CM | POA: Insufficient documentation

## 2023-05-02 DIAGNOSIS — I5022 Chronic systolic (congestive) heart failure: Secondary | ICD-10-CM | POA: Diagnosis not present

## 2023-05-02 DIAGNOSIS — R531 Weakness: Secondary | ICD-10-CM | POA: Diagnosis not present

## 2023-05-02 DIAGNOSIS — I493 Ventricular premature depolarization: Secondary | ICD-10-CM | POA: Diagnosis not present

## 2023-05-02 DIAGNOSIS — Z905 Acquired absence of kidney: Secondary | ICD-10-CM | POA: Insufficient documentation

## 2023-05-02 DIAGNOSIS — E669 Obesity, unspecified: Secondary | ICD-10-CM | POA: Insufficient documentation

## 2023-05-02 DIAGNOSIS — I739 Peripheral vascular disease, unspecified: Secondary | ICD-10-CM

## 2023-05-02 DIAGNOSIS — I428 Other cardiomyopathies: Secondary | ICD-10-CM | POA: Diagnosis not present

## 2023-05-02 DIAGNOSIS — I2510 Atherosclerotic heart disease of native coronary artery without angina pectoris: Secondary | ICD-10-CM | POA: Diagnosis not present

## 2023-05-02 MED ORDER — EMPAGLIFLOZIN 10 MG PO TABS: 10.0000 mg | ORAL_TABLET | Freq: Every day | ORAL | 3 refills | Status: DC

## 2023-05-02 NOTE — Progress Notes (Addendum)
ADVANCED HEART FAILURE CLINIC NOTE  Referring Physician: Philip Aspen, Almira Bar*  Primary Care: Philip Aspen, Limmie Patricia, MD Primary Cardiologist: Dietrich Pates  HPI: Leslie [name] is a 72 y.o. female with heart failure with reduced ejection fraction, atrial fibrillation, history of obesity status post gastric bypass, history of nephrectomy presenting today to establish care.  Her cardiac history dates back to February 2024 when she was admitted to [facility] for dyspnea; echocardiogram at that time demonstrated an EF of 25 to 30% with global hypokinesis.  She was started on low-dose GDMT at that time.  She also had a 7-day Zio patch that captured a A-fib burden of 36%.  She underwent coronary CTA in March 2024 that showed only mild CAD.  Since that time she has been seen in [facility] clinic where GDMT was further uptitrated.  Interval history: - From a functional standpoint she is doing fairly well; She reports feeling slightly nauseaus after taking farxiga but otherwise no lightheadedness.  - She is still wheelchair dependent due to severe lower extremity neuropathy. She had an EMG that was consistent with peripheral neuropathy; reports that she has poor sensory & motor function. Following with NSGY.   Activity level/exercise tolerance: NYHA Iib; limited by lower extremity weakness.  Orthopnea:  Sleeps on 2 pillows Paroxysmal noctural dyspnea: No Chest pain/pressure: No Orthostatic lightheadedness:  no Palpitations:  no Lower extremity edema: Infrequent, takes Lasix as needed for 2 to 3 pound weight gain and lower extremity edema Presyncope/syncope:  no Cough:  no  Past Medical History:  Diagnosis Date   ADD (attention deficit disorder)    Allergy    Anemia    Arthritis 07/22/2014   osteoarthritis left knee   Colon polyps    Hypertension    Left knee pain    chronic   Narcotic addiction (HCC)    Obesity    post gastric bypass   Vitamin D deficiency     Current Outpatient  Medications  Medication Sig Dispense Refill   ALPRAZolam (XANAX) 0.25 MG tablet Take 1 tablet (0.25 mg total) by mouth at bedtime as needed for anxiety. TAKE 1 TABLET(0.25 MG) BY MOUTH AT BEDTIME AS NEEDED FOR ANXIETY Strength: 0.25 mg 30 tablet 0   amiodarone (PACERONE) 200 MG tablet Take 200 mg by mouth daily.     apixaban (ELIQUIS) 5 MG TABS tablet Take 1 tablet (5 mg total) by mouth 2 (two) times daily. 180 tablet 3   atorvastatin (LIPITOR) 40 MG tablet Take 40 mg by mouth daily.     cephALEXin (KEFLEX) 500 MG capsule Take by mouth.     dapagliflozin propanediol (FARXIGA) 10 MG TABS tablet TAKE 1 TABLET(10 MG) BY MOUTH DAILY BEFORE BREAKFAST 30 tablet 11   FLUoxetine (PROZAC) 40 MG capsule TAKE 1 CAPSULE(40 MG) BY MOUTH EVERY MORNING 90 capsule 1   furosemide (LASIX) 40 MG tablet Take 20 mg by mouth daily as needed for fluid. Take one half (0.5) tablet by mouth ( 20 mg) as needed for wt gain of 3 lbs in 24 hours or 5 lbs in one week.     gabapentin (NEURONTIN) 300 MG capsule TAKE 1 CAPSULE BY MOUTH THREE TIMES DAILY 90 capsule 2   lamoTRIgine (LAMICTAL) 100 MG tablet TAKE 1 TABLET(100 MG) BY MOUTH TWICE DAILY (Patient taking differently: Take 100 mg by mouth 2 (two) times daily.) 180 tablet 1   losartan (COZAAR) 25 MG tablet Take 12.5 mg by mouth daily.     melatonin 3  MG TABS tablet Take 1 tablet (3 mg total) by mouth at bedtime.  0   methocarbamol (ROBAXIN) 500 MG tablet Take 1 tablet (500 mg total) by mouth every 6 (six) hours as needed for muscle spasms. 30 tablet 0   metoprolol succinate (TOPROL-XL) 50 MG 24 hr tablet Take 50 mg by mouth every evening. Take with or immediately following a meal.     Multiple Vitamins-Minerals (PRESERVISION AREDS 2) CAPS Take 1 capsule by mouth 2 (two) times daily.     polyethylene glycol (MIRALAX / GLYCOLAX) 17 g packet Take 17 g by mouth daily as needed. 14 each 0   potassium chloride (KLOR-CON M) 10 MEQ tablet Take 10 mEq by mouth daily.      senna-docusate (SENOKOT-S) 8.6-50 MG tablet Take 2 tablets by mouth 2 (two) times daily.     spironolactone (ALDACTONE) 25 MG tablet Take 0.5 tablets (12.5 mg total) by mouth daily. 30 tablet 5   Vitamin D, Ergocalciferol, (DRISDOL) 1.25 MG (50000 UNIT) CAPS capsule Take 1 capsule (50,000 Units total) by mouth every 7 (seven) days. 12 capsule 0   No current facility-administered medications for this encounter.    Allergies  Allergen Reactions   Amoxicillin     Hives   Nsaids     Due Gastric Bypass   Sulfamethoxazole Nausea And Vomiting    See patient list of med intolerances due to h/o gastric bypass and nephrectomy      Social History   Socioeconomic History   Marital status: Widowed    Spouse name: Not on file   Number of children: 2   Years of education: Not on file   Highest education level: Associate degree: academic program  Occupational History   Occupation: Retired  Tobacco Use   Smoking status: Former    Current packs/day: 0.00    Types: Cigarettes    Quit date: 07/22/1992    Years since quitting: 30.7   Smokeless tobacco: Never  Vaping Use   Vaping status: Never Used  Substance and Sexual Activity   Alcohol use: Yes    Comment: wine weekly   Drug use: No   Sexual activity: Not on file  Other Topics Concern   Not on file  Social History Narrative   Not on file   Social Determinants of Health   Financial Resource Strain: Low Risk  (09/20/2022)   Overall Financial Resource Strain (CARDIA)    Difficulty of Paying Living Expenses: Not very hard  Food Insecurity: No Food Insecurity (01/26/2023)   Hunger Vital Sign    Worried About Running Out of Food in the Last Year: Never true    Ran Out of Food in the Last Year: Never true  Transportation Needs: No Transportation Needs (01/26/2023)   PRAPARE - Administrator, Civil Service (Medical): No    Lack of Transportation (Non-Medical): No  Physical Activity: Unknown (10/04/2021)   Exercise Vital Sign     Days of Exercise per Week: 0 days    Minutes of Exercise per Session: Not on file  Stress: Not on file  Social Connections: Unknown (09/18/2022)   Received from [facility]   Social Network    Social Network: Not on file  Intimate Partner Violence: Not At Risk (01/26/2023)   Humiliation, Afraid, Rape, and Kick questionnaire    Fear of Current or Ex-Partner: No    Emotionally Abused: No    Physically Abused: No    Sexually Abused: No  Family History  Problem Relation Age of Onset   Heart disease Mother        post CABG history of CHF   COPD Father    Diabetes Sister    Hypertension Sister        post renal transplant   Heart disease Brother        CAD    PHYSICAL EXAM: Vitals:   05/02/23 1416  BP: 110/62  Pulse: (!) 50  SpO2: 97%   GENERAL: In wheelchair today HEENT: Negative for arcus senilis or xanthelasma. There is no scleral icterus.  The mucous membranes are pink and moist.   NECK: Supple, No masses. Normal carotid upstrokes without bruits. No masses or thyromegaly.    CHEST: There are no chest wall deformities. There is no chest wall tenderness. Respirations are unlabored.  Lungs-CTA bilaterally CARDIAC:  JVP: 7 cm          Normal rate with regular rhythm. No murmurs, rubs or gallops.  Pulses are 2+ and symmetrical in upper and lower extremities.  No edema.  ABDOMEN: Soft, non-tender, non-distended. There are no masses or hepatomegaly. There are normal bowel sounds.  EXTREMITIES: Warm and well perfused with no cyanosis, clubbing.  LYMPHATIC: No axillary or supraclavicular lymphadenopathy.  NEUROLOGIC: Patient is oriented x3; weak lower extrimeties.  PSYCH: Patients affect is appropriate, there is no evidence of anxiety or depression.  SKIN: Warm and dry; no lesions or wounds.    DATA REVIEW  ECG: 11/15/2022: Normal sinus rhythm as per my personal interpretation  ECHO: 08/29/22: LVEF 25%, moderate MR, moderate AI.  As per my personal  interpretation 12/19/22: LVEF 45%, mild AI as per my interpretation  CATH: Coronary CTA with mild nonobstructive CAD.   ASSESSMENT & PLAN:  Heart failure with reduced ejection fraction Etiology of HF: Nonischemic cardiomyopathy; coronary CTA from March 2024 with only mild CAD; possibly secondary to tachyarrhythmia.  Will cancel MRI as she has had a significant improvement in LVEF NYHA class / AHA Stage:NYHA II Volume status & Diuretics: euvolemic, taking lasix 40mg  daily.  Vasodilators: increase losartan to 25mg  daily.  Beta-Blocker: Continue metoprolol succinate 50 mg nightly [id number] spironolactone 12.5 mg daily, repeat labs today Cardiometabolic: start farxiga 10mg  Devices therapies & Valvulopathies: Not indicated Advanced therapies: Not indicated  2. Paroxysmal atrial fibrillation -Now s/p AF ablation -Repeat TTE in 2-61m -following with Dr. Nelly Laurence.   3. Nonobstructive coronary artery disease -Continue Lipitor 10 mg daily.  Followed by Dr. Dietrich Pates  4.  PVCs -Continue amiodarone 200 mg daily.  7.8% burden on 7-day Zio patch.  -Regular rate on exam today; following with Dr. Nelly Laurence.   5.  Nephrectomy -Serum creatinine stable. Reviewed labs.  -Increase losartan to 25 mg.  Continue Farxiga 10 mg.  6. 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 - EMG last week. Following with NSGY next week.    I spent 40 minutes caring for this patient today including face to face time, ordering and reviewing labs, reviewing records, we reviewed her echocardiograms together today, seeing the patient, documenting in the record, and arranging follow ups.   Latham Kinzler Advanced Heart Failure Mechanical Circulatory Support

## 2023-05-02 NOTE — Patient Instructions (Signed)
Medication Changes:  STOP FARXIGA   START: JARDIANCE 10MG  ONCE DAILY   Testing/Procedures:  Your physician has requested that you have an ankle brachial index (ABI). During this test an ultrasound and blood pressure cuff are used to evaluate the arteries that supply the arms and legs with blood. Allow thirty minutes for this exam. There are no restrictions or special instructions. This will take place at [address] [facility], [address].  THEY WILL CALL YOU TO GET YOU SCHEDULED FOR THIS   Your physician has requested that you have an echocardiogram. Echocardiography is a painless test that uses sound waves to create images of your heart. It provides your doctor with information about the size and shape of your heart and how well your heart's chambers and valves are working. This procedure takes approximately one hour. There are no restrictions for this procedure. Please do NOT wear cologne, perfume, aftershave, or lotions (deodorant is allowed). Please arrive 15 minutes prior to your appointment time.  Follow-Up in: 6 MONTHS PLEASE CALL OUR OFFICE AROUND FEBRUARY TO GET SCHEDULED FOR YOUR APPOINTMENT. PHONE NUMBER IS [phone] OPTION 2   At the Advanced [facility] Failure Clinic, you and your health needs are our priority. We have a designated team specialized in the treatment of Heart Failure. This Care Team includes your primary Heart Failure Specialized Cardiologist (physician), Advanced Practice Providers (APPs- Physician Assistants and Nurse Practitioners), and Pharmacist who all work together to provide you with the care you need, when you need it.   You may see any of the following providers on your designated Care Team at your next follow up:  Dr. Arvilla Meres Dr. Marca Ancona Dr. Dorthula Nettles Dr. Theresia Bough Tonye Becket, NP Robbie Lis, [place] [facility] [place], [place] Brynda Peon, NP [place] Lee, NP Karle Plumber, PharmD   Please be sure to bring in all your  medications bottles to every appointment.   Need to Contact us:  If you have any questions or concerns before your next appointment please send [place] a message through [place] or call our office at [phone].    TO LEAVE A MESSAGE FOR THE NURSE SELECT OPTION 2, PLEASE LEAVE A MESSAGE INCLUDING: YOUR NAME DATE OF BIRTH CALL BACK NUMBER REASON FOR CALL**this is important as we prioritize the call backs  YOU WILL RECEIVE A CALL BACK THE SAME DAY AS LONG AS YOU CALL BEFORE 4:00 PM

## 2023-05-08 DIAGNOSIS — S3210XD Unspecified fracture of sacrum, subsequent encounter for fracture with routine healing: Secondary | ICD-10-CM | POA: Diagnosis not present

## 2023-05-08 DIAGNOSIS — I5022 Chronic systolic (congestive) heart failure: Secondary | ICD-10-CM | POA: Diagnosis not present

## 2023-05-08 DIAGNOSIS — I110 Hypertensive heart disease with heart failure: Secondary | ICD-10-CM | POA: Diagnosis not present

## 2023-05-08 DIAGNOSIS — I480 Paroxysmal atrial fibrillation: Secondary | ICD-10-CM | POA: Diagnosis not present

## 2023-05-08 DIAGNOSIS — K589 Irritable bowel syndrome without diarrhea: Secondary | ICD-10-CM | POA: Diagnosis not present

## 2023-05-08 DIAGNOSIS — M48061 Spinal stenosis, lumbar region without neurogenic claudication: Secondary | ICD-10-CM | POA: Diagnosis not present

## 2023-05-08 DIAGNOSIS — U071 COVID-19: Secondary | ICD-10-CM | POA: Diagnosis not present

## 2023-05-08 DIAGNOSIS — M1990 Unspecified osteoarthritis, unspecified site: Secondary | ICD-10-CM | POA: Diagnosis not present

## 2023-05-08 DIAGNOSIS — E440 Moderate protein-calorie malnutrition: Secondary | ICD-10-CM | POA: Diagnosis not present

## 2023-05-09 ENCOUNTER — Ambulatory Visit (HOSPITAL_COMMUNITY): Payer: Medicare PPO | Admitting: Internal Medicine

## 2023-05-12 DIAGNOSIS — I480 Paroxysmal atrial fibrillation: Secondary | ICD-10-CM | POA: Diagnosis not present

## 2023-05-12 DIAGNOSIS — I110 Hypertensive heart disease with heart failure: Secondary | ICD-10-CM | POA: Diagnosis not present

## 2023-05-12 DIAGNOSIS — U071 COVID-19: Secondary | ICD-10-CM | POA: Diagnosis not present

## 2023-05-12 DIAGNOSIS — M48061 Spinal stenosis, lumbar region without neurogenic claudication: Secondary | ICD-10-CM | POA: Diagnosis not present

## 2023-05-12 DIAGNOSIS — K589 Irritable bowel syndrome without diarrhea: Secondary | ICD-10-CM | POA: Diagnosis not present

## 2023-05-12 DIAGNOSIS — M1990 Unspecified osteoarthritis, unspecified site: Secondary | ICD-10-CM | POA: Diagnosis not present

## 2023-05-12 DIAGNOSIS — I5022 Chronic systolic (congestive) heart failure: Secondary | ICD-10-CM | POA: Diagnosis not present

## 2023-05-12 DIAGNOSIS — E440 Moderate protein-calorie malnutrition: Secondary | ICD-10-CM | POA: Diagnosis not present

## 2023-05-12 DIAGNOSIS — S3210XD Unspecified fracture of sacrum, subsequent encounter for fracture with routine healing: Secondary | ICD-10-CM | POA: Diagnosis not present

## 2023-05-13 DIAGNOSIS — H35363 Drusen (degenerative) of macula, bilateral: Secondary | ICD-10-CM | POA: Diagnosis not present

## 2023-05-13 DIAGNOSIS — H5213 Myopia, bilateral: Secondary | ICD-10-CM | POA: Diagnosis not present

## 2023-05-14 ENCOUNTER — Inpatient Hospital Stay (HOSPITAL_COMMUNITY)
Admission: RE | Admit: 2023-05-14 | Discharge: 2023-05-14 | Discharge status: Home / Self Care | Payer: Medicare PPO | Source: Ambulatory Visit | Attending: Internal Medicine | Admitting: Internal Medicine

## 2023-05-14 VITALS — BP 106/60 | HR 43 | Ht 64.0 in | Wt 184.6 lb

## 2023-05-14 DIAGNOSIS — R9431 Abnormal electrocardiogram [ECG] [EKG]: Secondary | ICD-10-CM | POA: Diagnosis not present

## 2023-05-14 DIAGNOSIS — I4819 Other persistent atrial fibrillation: Secondary | ICD-10-CM | POA: Diagnosis not present

## 2023-05-14 DIAGNOSIS — U071 COVID-19: Secondary | ICD-10-CM | POA: Diagnosis not present

## 2023-05-14 DIAGNOSIS — Z9884 Bariatric surgery status: Secondary | ICD-10-CM | POA: Diagnosis not present

## 2023-05-14 DIAGNOSIS — I110 Hypertensive heart disease with heart failure: Secondary | ICD-10-CM | POA: Diagnosis not present

## 2023-05-14 DIAGNOSIS — I4891 Unspecified atrial fibrillation: Secondary | ICD-10-CM

## 2023-05-14 DIAGNOSIS — M48061 Spinal stenosis, lumbar region without neurogenic claudication: Secondary | ICD-10-CM | POA: Diagnosis not present

## 2023-05-14 DIAGNOSIS — Z7901 Long term (current) use of anticoagulants: Secondary | ICD-10-CM | POA: Diagnosis not present

## 2023-05-14 DIAGNOSIS — S3210XD Unspecified fracture of sacrum, subsequent encounter for fracture with routine healing: Secondary | ICD-10-CM | POA: Diagnosis not present

## 2023-05-14 DIAGNOSIS — I2510 Atherosclerotic heart disease of native coronary artery without angina pectoris: Secondary | ICD-10-CM | POA: Insufficient documentation

## 2023-05-14 DIAGNOSIS — D6869 Other thrombophilia: Secondary | ICD-10-CM | POA: Insufficient documentation

## 2023-05-14 DIAGNOSIS — I480 Paroxysmal atrial fibrillation: Secondary | ICD-10-CM | POA: Diagnosis not present

## 2023-05-14 DIAGNOSIS — Z79899 Other long term (current) drug therapy: Secondary | ICD-10-CM | POA: Diagnosis not present

## 2023-05-14 DIAGNOSIS — I5020 Unspecified systolic (congestive) heart failure: Secondary | ICD-10-CM | POA: Diagnosis not present

## 2023-05-14 DIAGNOSIS — Z5181 Encounter for therapeutic drug level monitoring: Secondary | ICD-10-CM

## 2023-05-14 DIAGNOSIS — I5022 Chronic systolic (congestive) heart failure: Secondary | ICD-10-CM | POA: Diagnosis not present

## 2023-05-14 DIAGNOSIS — M1990 Unspecified osteoarthritis, unspecified site: Secondary | ICD-10-CM | POA: Diagnosis not present

## 2023-05-14 DIAGNOSIS — K589 Irritable bowel syndrome without diarrhea: Secondary | ICD-10-CM | POA: Diagnosis not present

## 2023-05-14 DIAGNOSIS — E440 Moderate protein-calorie malnutrition: Secondary | ICD-10-CM | POA: Diagnosis not present

## 2023-05-14 MED ORDER — METOPROLOL SUCCINATE ER 25 MG PO TB24: 12.5000 mg | ORAL_TABLET | Freq: Every day | ORAL | 3 refills | Status: DC

## 2023-05-14 NOTE — Patient Instructions (Addendum)
Decrease metoprolol to 12.5mg  daily (1/2 of a 25mg  tablet)

## 2023-05-14 NOTE — Progress Notes (Signed)
Primary Care Physician: Philip Aspen, Limmie Patricia, MD Primary Cardiologist: None Electrophysiologist: Maurice Small, MD     Referring Physician: Dr. Laurence Aly is a 72 y.o. female with a history of HFrEF, coronary artery calcifications, HTN gastric bypass, nephrectomy, and persistent atrial fibrillation who presents for consultation in the [facility]. S/p Afib ablation on 04/11/23 by Dr. Nelly Laurence. Patient is on Eliquis 5 mg BID for a CHADS2VASC score of 5.  On evaluation today, she is currently in NSR. No episodes of Afib since ablation. No chest pain, SOB, or trouble swallowing. Leg sites healed without issue. No missed doses of anticoagulant. CMA was told by patient that she was taking Toprol as needed; after phone call with daughter that arranges her pillbox she is in fact taking Toprol 50 mg daily.  Today, she denies symptoms of orthopnea, PND, lower extremity edema, dizziness, presyncope, syncope, snoring, daytime somnolence, bleeding, or neurologic sequela. The patient is tolerating medications without difficulties and is otherwise without complaint today.   she has a BMI of Body mass index is 31.69 kg/m.Marland Kitchen Filed Weights   05/14/23 1420  Weight: 83.7 kg    Current Outpatient Medications  Medication Sig Dispense Refill   ALPRAZolam (XANAX) 0.25 MG tablet Take 1 tablet (0.25 mg total) by mouth at bedtime as needed for anxiety. TAKE 1 TABLET(0.25 MG) BY MOUTH AT BEDTIME AS NEEDED FOR ANXIETY Strength: 0.25 mg 30 tablet 0   amiodarone (PACERONE) 200 MG tablet Take 200 mg by mouth daily.     apixaban (ELIQUIS) 5 MG TABS tablet Take 1 tablet (5 mg total) by mouth 2 (two) times daily. 180 tablet 3   atorvastatin (LIPITOR) 40 MG tablet Take 40 mg by mouth daily.     empagliflozin (JARDIANCE) 10 MG TABS tablet Take 1 tablet (10 mg total) by mouth daily before breakfast. 90 tablet 3   FLUoxetine (PROZAC) 40 MG capsule TAKE 1 CAPSULE(40 MG)  BY MOUTH EVERY MORNING 90 capsule 1   furosemide (LASIX) 40 MG tablet Take 20 mg by mouth every other day. Take one half (0.5) tablet by mouth ( 20 mg) as needed for wt gain of 3 lbs in 24 hours or 5 lbs in one week.     gabapentin (NEURONTIN) 300 MG capsule TAKE 1 CAPSULE BY MOUTH THREE TIMES DAILY 90 capsule 2   lamoTRIgine (LAMICTAL) 100 MG tablet TAKE 1 TABLET(100 MG) BY MOUTH TWICE DAILY (Patient taking differently: Take 100 mg by mouth 2 (two) times daily.) 180 tablet 1   losartan (COZAAR) 25 MG tablet Take 25 mg by mouth daily.     melatonin 3 MG TABS tablet Take 1 tablet (3 mg total) by mouth at bedtime.  0   metoprolol succinate (TOPROL-XL) 50 MG 24 hr tablet Take 50 mg by mouth every evening. Take with or immediately following a meal.     Multiple Vitamins-Minerals (PRESERVISION AREDS 2) CAPS Take 1 capsule by mouth 2 (two) times daily.     polyethylene glycol (MIRALAX / GLYCOLAX) 17 g packet Take 17 g by mouth daily as needed. 14 each 0   potassium chloride (KLOR-CON M) 10 MEQ tablet Take 10 mEq by mouth daily.     senna-docusate (SENOKOT-S) 8.6-50 MG tablet Take 2 tablets by mouth 2 (two) times daily. (Patient taking differently: Take 1 tablet by mouth every other day.)     spironolactone (ALDACTONE) 25 MG tablet Take 0.5 tablets (12.5 mg total)  by mouth daily. 30 tablet 5   Vitamin D, Ergocalciferol, (DRISDOL) 1.25 MG (50000 UNIT) CAPS capsule Take 1 capsule (50,000 Units total) by mouth every 7 (seven) days. 12 capsule 0   No current facility-administered medications for this encounter.    Atrial Fibrillation Management history:  Previous antiarrhythmic drugs: amiodarone Previous cardioversions: none Previous ablations: 04/11/23 Anticoagulation history: Eliquis   ROS- All systems are reviewed and negative except as per the HPI above.  Physical Exam: Ht 5\' 4"  (1.626 m)   Wt 83.7 kg   BMI 31.69 kg/m   GEN: Well nourished, well developed in no acute distress NECK: No JVD;  No carotid bruits CARDIAC: Regular bradycardic rate and rhythm, no murmurs, rubs, gallops RESPIRATORY:  Clear to auscultation without rales, wheezing or rhonchi  ABDOMEN: Soft, non-tender, non-distended EXTREMITIES:  No edema; No deformity   EKG today demonstrates  Vent. rate 43 BPM PR interval 192 ms QRS duration 96 ms QT/QTcB 486/410 ms P-R-T axes 57 24 61 Marked sinus bradycardia Abnormal ECG When compared with ECG of 11-Apr-2023 15:56, PREVIOUS ECG IS PRESENT  Echo 12/19/22 demonstrated   1. Left ventricular ejection fraction, by estimation, is 45%. The left  ventricle has mildly decreased function. The left ventricle has no  regional wall motion abnormalities. Left ventricular diastolic parameters  are consistent with Grade I diastolic  dysfunction (impaired relaxation). GLS -15.7%.   2. Right ventricular systolic function is normal. The right ventricular  size is normal.   3. Left atrial size was mildly dilated.   4. The mitral valve is normal in structure. Mild mitral valve  regurgitation. No evidence of mitral stenosis.   5. The aortic valve is normal in structure. Aortic valve regurgitation is  mild. No aortic stenosis is present.   6. The inferior vena cava is normal in size with greater than 50%  respiratory variability, suggesting right atrial pressure of 3 mmHg.    ASSESSMENT & PLAN CHA2DS2-VASc Score = 5  The patient's score is based upon: CHF History: 1 HTN History: 1 Diabetes History: 0 Stroke History: 0 Vascular Disease History: 1 Age Score: 1 Gender Score: 1       ASSESSMENT AND PLAN: Persistent Atrial Fibrillation (ICD10:  I48.19) The patient's CHA2DS2-VASc score is 5, indicating a 7.2% annual risk of stroke.   S/p Afib ablation on 04/11/23 by Dr. Nelly Laurence.  She is currently in sinus bradycardia.  Qtc stable. Continue amiodarone 200 mg daily.  Decrease Toprol to 12.5 mg daily.   Secondary Hypercoagulable State (ICD10:  D68.69) The patient is  at significant risk for stroke/thromboembolism based upon her CHA2DS2-VASc Score of 5.  Continue Apixaban (Eliquis).  No missed doses.     Follow up as scheduled with Dr. Nelly Laurence.    Lake Bells, PA-C  Afib Clinic [facility] [address] [place], [place] [address] [phone]

## 2023-05-15 DIAGNOSIS — R262 Difficulty in walking, not elsewhere classified: Secondary | ICD-10-CM | POA: Diagnosis not present

## 2023-05-16 ENCOUNTER — Ambulatory Visit (HOSPITAL_COMMUNITY)
Admission: RE | Admit: 2023-05-16 | Discharge: 2023-05-16 | Disposition: A | Discharge status: Home / Self Care | Payer: Medicare PPO | Source: Ambulatory Visit | Attending: Cardiology | Admitting: Cardiology

## 2023-05-16 DIAGNOSIS — I739 Peripheral vascular disease, unspecified: Secondary | ICD-10-CM | POA: Insufficient documentation

## 2023-05-16 LAB — VAS US ABI WITH/WO TBI
Left ABI: 1.23
Right ABI: 1.32

## 2023-05-17 DIAGNOSIS — I110 Hypertensive heart disease with heart failure: Secondary | ICD-10-CM | POA: Diagnosis not present

## 2023-05-17 DIAGNOSIS — S3210XD Unspecified fracture of sacrum, subsequent encounter for fracture with routine healing: Secondary | ICD-10-CM | POA: Diagnosis not present

## 2023-05-17 DIAGNOSIS — I5022 Chronic systolic (congestive) heart failure: Secondary | ICD-10-CM | POA: Diagnosis not present

## 2023-05-17 DIAGNOSIS — E440 Moderate protein-calorie malnutrition: Secondary | ICD-10-CM | POA: Diagnosis not present

## 2023-05-17 DIAGNOSIS — K589 Irritable bowel syndrome without diarrhea: Secondary | ICD-10-CM | POA: Diagnosis not present

## 2023-05-17 DIAGNOSIS — I480 Paroxysmal atrial fibrillation: Secondary | ICD-10-CM | POA: Diagnosis not present

## 2023-05-17 DIAGNOSIS — U071 COVID-19: Secondary | ICD-10-CM | POA: Diagnosis not present

## 2023-05-17 DIAGNOSIS — M1990 Unspecified osteoarthritis, unspecified site: Secondary | ICD-10-CM | POA: Diagnosis not present

## 2023-05-17 DIAGNOSIS — M48061 Spinal stenosis, lumbar region without neurogenic claudication: Secondary | ICD-10-CM | POA: Diagnosis not present

## 2023-05-19 DIAGNOSIS — I480 Paroxysmal atrial fibrillation: Secondary | ICD-10-CM | POA: Diagnosis not present

## 2023-05-19 DIAGNOSIS — M1990 Unspecified osteoarthritis, unspecified site: Secondary | ICD-10-CM | POA: Diagnosis not present

## 2023-05-19 DIAGNOSIS — E440 Moderate protein-calorie malnutrition: Secondary | ICD-10-CM | POA: Diagnosis not present

## 2023-05-19 DIAGNOSIS — S3210XD Unspecified fracture of sacrum, subsequent encounter for fracture with routine healing: Secondary | ICD-10-CM | POA: Diagnosis not present

## 2023-05-19 DIAGNOSIS — K589 Irritable bowel syndrome without diarrhea: Secondary | ICD-10-CM | POA: Diagnosis not present

## 2023-05-19 DIAGNOSIS — I110 Hypertensive heart disease with heart failure: Secondary | ICD-10-CM | POA: Diagnosis not present

## 2023-05-19 DIAGNOSIS — M48061 Spinal stenosis, lumbar region without neurogenic claudication: Secondary | ICD-10-CM | POA: Diagnosis not present

## 2023-05-19 DIAGNOSIS — I5022 Chronic systolic (congestive) heart failure: Secondary | ICD-10-CM | POA: Diagnosis not present

## 2023-05-19 DIAGNOSIS — U071 COVID-19: Secondary | ICD-10-CM | POA: Diagnosis not present

## 2023-05-24 DIAGNOSIS — N760 Acute vaginitis: Secondary | ICD-10-CM | POA: Diagnosis not present

## 2023-05-24 DIAGNOSIS — I952 Hypotension due to drugs: Secondary | ICD-10-CM | POA: Diagnosis not present

## 2023-05-24 DIAGNOSIS — R300 Dysuria: Secondary | ICD-10-CM | POA: Diagnosis not present

## 2023-05-27 DIAGNOSIS — I480 Paroxysmal atrial fibrillation: Secondary | ICD-10-CM | POA: Diagnosis not present

## 2023-05-27 DIAGNOSIS — M48061 Spinal stenosis, lumbar region without neurogenic claudication: Secondary | ICD-10-CM | POA: Diagnosis not present

## 2023-05-27 DIAGNOSIS — M1990 Unspecified osteoarthritis, unspecified site: Secondary | ICD-10-CM | POA: Diagnosis not present

## 2023-05-27 DIAGNOSIS — U071 COVID-19: Secondary | ICD-10-CM | POA: Diagnosis not present

## 2023-05-27 DIAGNOSIS — I110 Hypertensive heart disease with heart failure: Secondary | ICD-10-CM | POA: Diagnosis not present

## 2023-05-27 DIAGNOSIS — S3210XD Unspecified fracture of sacrum, subsequent encounter for fracture with routine healing: Secondary | ICD-10-CM | POA: Diagnosis not present

## 2023-05-27 DIAGNOSIS — I5022 Chronic systolic (congestive) heart failure: Secondary | ICD-10-CM | POA: Diagnosis not present

## 2023-05-27 DIAGNOSIS — K589 Irritable bowel syndrome without diarrhea: Secondary | ICD-10-CM | POA: Diagnosis not present

## 2023-05-27 DIAGNOSIS — E440 Moderate protein-calorie malnutrition: Secondary | ICD-10-CM | POA: Diagnosis not present

## 2023-05-29 ENCOUNTER — Ambulatory Visit (HOSPITAL_COMMUNITY)
Admission: RE | Admit: 2023-05-29 | Discharge: 2023-05-29 | Disposition: A | Discharge status: Home / Self Care | Payer: Medicare PPO | Source: Ambulatory Visit | Attending: Cardiology | Admitting: Cardiology

## 2023-05-29 DIAGNOSIS — I080 Rheumatic disorders of both mitral and aortic valves: Secondary | ICD-10-CM | POA: Diagnosis not present

## 2023-05-29 DIAGNOSIS — I509 Heart failure, unspecified: Secondary | ICD-10-CM | POA: Insufficient documentation

## 2023-05-29 DIAGNOSIS — I110 Hypertensive heart disease with heart failure: Secondary | ICD-10-CM | POA: Diagnosis not present

## 2023-05-29 DIAGNOSIS — I5022 Chronic systolic (congestive) heart failure: Secondary | ICD-10-CM | POA: Diagnosis not present

## 2023-05-29 LAB — ECHOCARDIOGRAM COMPLETE
AR max vel: 1.79 cm2
AV Area VTI: 1.65 cm2
AV Area mean vel: 1.74 cm2
AV Mean grad: 6.5 mm[Hg]
AV Peak grad: 11.6 mm[Hg]
Ao pk vel: 1.71 m/s
Area-P 1/2: 2.18 cm2
Calc EF: 58.4 %
P 1/2 time: 572 ms
S' Lateral: 3.5 cm
Single Plane A2C EF: 58.9 %
Single Plane A4C EF: 54.3 %

## 2023-06-05 DIAGNOSIS — K589 Irritable bowel syndrome without diarrhea: Secondary | ICD-10-CM | POA: Diagnosis not present

## 2023-06-05 DIAGNOSIS — S3210XD Unspecified fracture of sacrum, subsequent encounter for fracture with routine healing: Secondary | ICD-10-CM | POA: Diagnosis not present

## 2023-06-05 DIAGNOSIS — I480 Paroxysmal atrial fibrillation: Secondary | ICD-10-CM | POA: Diagnosis not present

## 2023-06-05 DIAGNOSIS — I110 Hypertensive heart disease with heart failure: Secondary | ICD-10-CM | POA: Diagnosis not present

## 2023-06-05 DIAGNOSIS — E440 Moderate protein-calorie malnutrition: Secondary | ICD-10-CM | POA: Diagnosis not present

## 2023-06-05 DIAGNOSIS — U071 COVID-19: Secondary | ICD-10-CM | POA: Diagnosis not present

## 2023-06-05 DIAGNOSIS — I5022 Chronic systolic (congestive) heart failure: Secondary | ICD-10-CM | POA: Diagnosis not present

## 2023-06-05 DIAGNOSIS — M48061 Spinal stenosis, lumbar region without neurogenic claudication: Secondary | ICD-10-CM | POA: Diagnosis not present

## 2023-06-05 DIAGNOSIS — M1990 Unspecified osteoarthritis, unspecified site: Secondary | ICD-10-CM | POA: Diagnosis not present

## 2023-06-06 ENCOUNTER — Other Ambulatory Visit: Payer: Self-pay | Admitting: Adult Health

## 2023-06-06 DIAGNOSIS — G479 Sleep disorder, unspecified: Secondary | ICD-10-CM

## 2023-06-09 DIAGNOSIS — G629 Polyneuropathy, unspecified: Secondary | ICD-10-CM | POA: Diagnosis not present

## 2023-06-09 MED ORDER — ALPRAZOLAM 0.25 MG PO TABS
0.2500 mg | ORAL_TABLET | Freq: Every evening | ORAL | 0 refills | Status: DC | PRN
Start: 2023-06-09 — End: 2023-07-28

## 2023-06-10 DIAGNOSIS — I480 Paroxysmal atrial fibrillation: Secondary | ICD-10-CM | POA: Diagnosis not present

## 2023-06-10 DIAGNOSIS — I5022 Chronic systolic (congestive) heart failure: Secondary | ICD-10-CM | POA: Diagnosis not present

## 2023-06-10 DIAGNOSIS — M48061 Spinal stenosis, lumbar region without neurogenic claudication: Secondary | ICD-10-CM | POA: Diagnosis not present

## 2023-06-10 DIAGNOSIS — M1990 Unspecified osteoarthritis, unspecified site: Secondary | ICD-10-CM | POA: Diagnosis not present

## 2023-06-10 DIAGNOSIS — U071 COVID-19: Secondary | ICD-10-CM | POA: Diagnosis not present

## 2023-06-10 DIAGNOSIS — E440 Moderate protein-calorie malnutrition: Secondary | ICD-10-CM | POA: Diagnosis not present

## 2023-06-10 DIAGNOSIS — I110 Hypertensive heart disease with heart failure: Secondary | ICD-10-CM | POA: Diagnosis not present

## 2023-06-10 DIAGNOSIS — S3210XD Unspecified fracture of sacrum, subsequent encounter for fracture with routine healing: Secondary | ICD-10-CM | POA: Diagnosis not present

## 2023-06-10 DIAGNOSIS — K589 Irritable bowel syndrome without diarrhea: Secondary | ICD-10-CM | POA: Diagnosis not present

## 2023-06-11 ENCOUNTER — Encounter: Payer: Self-pay | Admitting: Neurology

## 2023-06-11 DIAGNOSIS — I480 Paroxysmal atrial fibrillation: Secondary | ICD-10-CM | POA: Diagnosis not present

## 2023-06-11 DIAGNOSIS — E440 Moderate protein-calorie malnutrition: Secondary | ICD-10-CM | POA: Diagnosis not present

## 2023-06-11 DIAGNOSIS — U071 COVID-19: Secondary | ICD-10-CM | POA: Diagnosis not present

## 2023-06-11 DIAGNOSIS — M1990 Unspecified osteoarthritis, unspecified site: Secondary | ICD-10-CM | POA: Diagnosis not present

## 2023-06-11 DIAGNOSIS — I110 Hypertensive heart disease with heart failure: Secondary | ICD-10-CM | POA: Diagnosis not present

## 2023-06-11 DIAGNOSIS — S3210XD Unspecified fracture of sacrum, subsequent encounter for fracture with routine healing: Secondary | ICD-10-CM | POA: Diagnosis not present

## 2023-06-11 DIAGNOSIS — I5022 Chronic systolic (congestive) heart failure: Secondary | ICD-10-CM | POA: Diagnosis not present

## 2023-06-11 DIAGNOSIS — K589 Irritable bowel syndrome without diarrhea: Secondary | ICD-10-CM | POA: Diagnosis not present

## 2023-06-11 DIAGNOSIS — M48061 Spinal stenosis, lumbar region without neurogenic claudication: Secondary | ICD-10-CM | POA: Diagnosis not present

## 2023-06-12 ENCOUNTER — Emergency Department (HOSPITAL_BASED_OUTPATIENT_CLINIC_OR_DEPARTMENT_OTHER): Payer: Medicare PPO | Admitting: Radiology

## 2023-06-12 ENCOUNTER — Other Ambulatory Visit: Payer: Self-pay

## 2023-06-12 ENCOUNTER — Encounter (HOSPITAL_BASED_OUTPATIENT_CLINIC_OR_DEPARTMENT_OTHER): Payer: Self-pay

## 2023-06-12 ENCOUNTER — Emergency Department (HOSPITAL_BASED_OUTPATIENT_CLINIC_OR_DEPARTMENT_OTHER)
Admission: EM | Admit: 2023-06-12 | Discharge: 2023-06-12 | Disposition: A | Discharge status: Home / Self Care | Payer: Medicare PPO | Attending: Emergency Medicine | Admitting: Emergency Medicine

## 2023-06-12 ENCOUNTER — Emergency Department (HOSPITAL_BASED_OUTPATIENT_CLINIC_OR_DEPARTMENT_OTHER): Payer: Medicare PPO

## 2023-06-12 DIAGNOSIS — S0990XA Unspecified injury of head, initial encounter: Secondary | ICD-10-CM | POA: Insufficient documentation

## 2023-06-12 DIAGNOSIS — S99811A Other specified injuries of right ankle, initial encounter: Secondary | ICD-10-CM | POA: Diagnosis not present

## 2023-06-12 DIAGNOSIS — W19XXXA Unspecified fall, initial encounter: Secondary | ICD-10-CM | POA: Diagnosis not present

## 2023-06-12 DIAGNOSIS — I110 Hypertensive heart disease with heart failure: Secondary | ICD-10-CM | POA: Insufficient documentation

## 2023-06-12 DIAGNOSIS — Z96653 Presence of artificial knee joint, bilateral: Secondary | ICD-10-CM | POA: Diagnosis not present

## 2023-06-12 DIAGNOSIS — G319 Degenerative disease of nervous system, unspecified: Secondary | ICD-10-CM | POA: Diagnosis not present

## 2023-06-12 DIAGNOSIS — S199XXA Unspecified injury of neck, initial encounter: Secondary | ICD-10-CM | POA: Diagnosis not present

## 2023-06-12 DIAGNOSIS — Z7901 Long term (current) use of anticoagulants: Secondary | ICD-10-CM | POA: Diagnosis not present

## 2023-06-12 DIAGNOSIS — I4891 Unspecified atrial fibrillation: Secondary | ICD-10-CM | POA: Insufficient documentation

## 2023-06-12 DIAGNOSIS — R0789 Other chest pain: Secondary | ICD-10-CM | POA: Diagnosis not present

## 2023-06-12 DIAGNOSIS — M25571 Pain in right ankle and joints of right foot: Secondary | ICD-10-CM | POA: Diagnosis not present

## 2023-06-12 DIAGNOSIS — R0781 Pleurodynia: Secondary | ICD-10-CM | POA: Diagnosis not present

## 2023-06-12 DIAGNOSIS — M2041 Other hammer toe(s) (acquired), right foot: Secondary | ICD-10-CM | POA: Diagnosis not present

## 2023-06-12 DIAGNOSIS — I700 Atherosclerosis of aorta: Secondary | ICD-10-CM | POA: Diagnosis not present

## 2023-06-12 DIAGNOSIS — I509 Heart failure, unspecified: Secondary | ICD-10-CM | POA: Diagnosis not present

## 2023-06-12 DIAGNOSIS — Z87891 Personal history of nicotine dependence: Secondary | ICD-10-CM | POA: Insufficient documentation

## 2023-06-12 DIAGNOSIS — M79671 Pain in right foot: Secondary | ICD-10-CM | POA: Diagnosis not present

## 2023-06-12 DIAGNOSIS — R9389 Abnormal findings on diagnostic imaging of other specified body structures: Secondary | ICD-10-CM | POA: Diagnosis not present

## 2023-06-12 DIAGNOSIS — I6523 Occlusion and stenosis of bilateral carotid arteries: Secondary | ICD-10-CM | POA: Diagnosis not present

## 2023-06-12 DIAGNOSIS — M19071 Primary osteoarthritis, right ankle and foot: Secondary | ICD-10-CM | POA: Diagnosis not present

## 2023-06-12 MED ORDER — HYDROCODONE-ACETAMINOPHEN 5-325 MG PO TABS: 1.0000 | ORAL_TABLET | Freq: Four times a day (QID) | ORAL | 0 refills | Status: DC | PRN

## 2023-06-12 MED ORDER — HYDROCODONE-ACETAMINOPHEN 5-325 MG PO TABS: 1.0000 | ORAL_TABLET | Freq: Once | ORAL | Status: DC

## 2023-06-12 MED ORDER — HYDROCODONE-ACETAMINOPHEN 5-325 MG PO TABS
1.0000 | ORAL_TABLET | Freq: Once | ORAL | Status: AC
  Administered 2023-06-12: 1 via ORAL
  Filled 2023-06-12: qty 1

## 2023-06-12 NOTE — Discharge Instructions (Addendum)
As discussed, workup today overall reassuring.  CT imaging of your head and spine did not show evidence of brain bleed, fracture or dislocation.  X-rays of your right foot/ankle did not show any obvious fracture or dislocation.  X-ray of your chest was reassuring without evidence of collapsed lung, rib fracture or other abnormality.  Recommend use of incentive spirometry at home to decrease likelihood of development of pneumonia.  Use as directed by respiratory therapy.  Will recommend continued use of Tylenol at home for treatment of pain.  Please do not hesitate to return if the worrisome signs and symptoms we discussed become apparent.

## 2023-06-12 NOTE — ED Provider Notes (Signed)
[place] EMERGENCY DEPARTMENT AT [facility] Provider Note   CSN: [id number] Arrival date & time: 06/12/23  1644     History  Chief Complaint  Patient presents with   Fall    Blood thinner   Ankle Pain    right    Leslie [name] is a 72 y.o. female.   Fall  Ankle Pain   72 year old female presents emergency department after a fall.  Patient states the fall occurred last night when she was transferring from her wheelchair to a nearby toilet.  States that she feels like her right foot got caught on one of the legs of either the wheelchair or the toilet causing her to fall.  States that she felt like her right ankle rolled inward.  States that she fell backwards on her right side.  Reports hitting her head on a nightstand when she fell but no loss of consciousness.  Patient is on Eliquis secondary to atrial fibrillation.  Denies any visual disturbance at baseline, slurred speech, facial droop, weakness/sensory deficits from baseline department extremities, gait abnormalities are baseline.  Patient has not been able to put much weight on her right foot/ankle secondary to pain.  They state that last night, was having right sided rib pain but states his symptoms have improved this morning.  Denies any shortness of breath, abdominal pain, nausea, vomiting.  Past medical history significant for hypertension, bilateral knee total arthroplasty, CHF, gastric bypass, persistent atrial fibrillation on Eliquis, ADHD  Home Medications Prior to Admission medications   Medication Sig Start Date End Date Taking? Authorizing Provider  furosemide (LASIX) 40 MG tablet Take 20 mg by mouth every other day. Take one half (0.5) tablet by mouth ( 20 mg) as needed for wt gain of 3 lbs in 24 hours or 5 lbs in one week.   Yes [provider]  HYDROcodone-acetaminophen (NORCO/VICODIN) 5-325 MG tablet Take 1 tablet by mouth every 6 (six) hours as needed. 06/12/23  Yes Sherian Maroon A, PA   ALPRAZolam Prudy Feeler) 0.25 MG tablet Take 1 tablet (0.25 mg total) by mouth at bedtime as needed for anxiety. TAKE 1 TABLET(0.25 MG) BY MOUTH AT BEDTIME AS NEEDED FOR ANXIETY Strength: 0.25 mg 06/09/23   Philip Aspen, Limmie Patricia, MD  amiodarone (PACERONE) 200 MG tablet Take 200 mg by mouth daily.    [provider]  amphetamine-dextroamphetamine (ADDERALL XR) 30 MG 24 hr capsule Take 30 mg by mouth daily. 05/29/23   [provider]  apixaban (ELIQUIS) 5 MG TABS tablet Take 1 tablet (5 mg total) by mouth 2 (two) times daily. 09/09/22   Pricilla Riffle, MD  atorvastatin (LIPITOR) 40 MG tablet Take 40 mg by mouth daily.    [provider]  empagliflozin (JARDIANCE) 10 MG TABS tablet Take 1 tablet (10 mg total) by mouth daily before breakfast. 05/02/23   Sabharwal, Aditya, DO  FLUoxetine (PROZAC) 40 MG capsule TAKE 1 CAPSULE(40 MG) BY MOUTH EVERY MORNING 03/31/23   Philip Aspen, Limmie Patricia, MD  gabapentin (NEURONTIN) 300 MG capsule TAKE 1 CAPSULE BY MOUTH THREE TIMES DAILY 04/28/23   Philip Aspen, Limmie Patricia, MD  lamoTRIgine (LAMICTAL) 100 MG tablet TAKE 1 TABLET(100 MG) BY MOUTH TWICE DAILY Patient taking differently: Take 100 mg by mouth 2 (two) times daily. 11/13/22   Philip Aspen, Limmie Patricia, MD  losartan (COZAAR) 25 MG tablet Take 25 mg by mouth daily. Patient has only been taking 1/2 tablet daily.    [provider]  melatonin 3 MG TABS tablet Take 1 tablet (3 mg total) by mouth at bedtime. 01/09/23   [place], Alvira Philips, DO  metoprolol succinate (TOPROL XL) 25 MG 24 hr tablet Take 0.5 tablets (12.5 mg total) by mouth daily. 05/14/23 05/13/24  Eustace Pen, PA-C  Multiple Vitamins-Minerals (PRESERVISION AREDS 2) CAPS Take 1 capsule by mouth 2 (two) times daily.    [provider]  polyethylene glycol (MIRALAX / GLYCOLAX) 17 g packet Take 17 g by mouth daily as needed. 01/09/23   [place], Alvira Philips, DO  potassium chloride (KLOR-CON M) 10 MEQ tablet Take 10 mEq  by mouth daily. 01/09/23   [provider]  senna-docusate (SENOKOT-S) 8.6-50 MG tablet Take 2 tablets by mouth 2 (two) times daily. Patient taking differently: Take 1 tablet by mouth every other day. 01/09/23   [place], Alvira Philips, DO  spironolactone (ALDACTONE) 25 MG tablet Take 0.5 tablets (12.5 mg total) by mouth daily. 10/30/22   Robbie Lis M, PA-C  Vitamin D, Ergocalciferol, (DRISDOL) 1.25 MG (50000 UNIT) CAPS capsule Take 1 capsule (50,000 Units total) by mouth every 7 (seven) days. 03/25/23   Philip Aspen, Limmie Patricia, MD      Allergies    Amoxicillin, Nsaids, and Sulfamethoxazole    Review of Systems   Review of Systems  All other systems reviewed and are negative.   Physical Exam Updated Vital Signs BP (!) 116/50   Pulse 87   Temp 97.8 F (36.6 C) (Oral)   Resp 16   Ht 5\' 4"  (1.626 m)   Wt 82.6 kg   SpO2 99%   BMI 31.24 kg/m  Physical Exam Vitals and nursing note reviewed.  Constitutional:      General: She is not in acute distress.    Appearance: She is well-developed.  HENT:     Head: Normocephalic and atraumatic.  Eyes:     Conjunctiva/sclera: Conjunctivae normal.  Cardiovascular:     Rate and Rhythm: Normal rate and regular rhythm.  Pulmonary:     Effort: Pulmonary effort is normal. No respiratory distress.     Breath sounds: Normal breath sounds.  Abdominal:     Palpations: Abdomen is soft.     Tenderness: There is no abdominal tenderness.  Musculoskeletal:        General: No swelling.     Cervical back: Neck supple.     Comments: No midline tenderness of cervical, thoracic, lumbar spine without step-off or deformity.  Slight right-sided chest wall tenderness without obvious palpable step-off, deformity, ecchymosis or swelling.  Patient with full range of motion of bilateral upper extremities without overlying tenderness.  Tender palpation right posterior medial malleolus as well as base of fifth metatarsal on right foot.  Otherwise, no  reproducible tenderness when palpating lower extremities.  Patient able to range right ankle in both dorsi and plantarflexion but with pain.  Pedal pulses and posterior tibial pulses 2+ bilaterally.  No overlying erythema, palpable fluctuance/induration.  Skin:    General: Skin is warm and dry.     Capillary Refill: Capillary refill takes less than 2 seconds.  Neurological:     Mental Status: She is alert.  Psychiatric:        Mood and Affect: Mood normal.     ED Results / Procedures / Treatments   Labs (all labs ordered are listed, but only abnormal results are displayed) Labs Reviewed - No data to display  EKG None  Radiology DG Ankle Complete Right  Result Date: 06/12/2023  CLINICAL DATA:  Pain after fall last night. Fall while transferring from wheelchair to toilet. EXAM: RIGHT ANKLE - COMPLETE 3+ VIEW; RIGHT FOOT COMPLETE - 3+ VIEW COMPARISON:  None Available. FINDINGS: Ankle: The bones are subjectively under mineralized. No evidence of acute fracture. No dislocation. The ankle mortise is preserved. Mild chronic spurring about the medial malleolus. Chronic soft tissue calcifications in the lower leg. Foot: No evidence of acute fracture or dislocation. Hammertoe deformity of the toes. 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 Incidental os navicular. There is a moderate plantar calcaneal spur. Small Achilles tendon enthesophyte. IMPRESSION: 1. No acute fracture or dislocation of the right ankle or foot. 2. 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 Electronically Signed   By: Narda Rutherford M.D.   On: 06/12/2023 19:14   DG Foot Complete Right  Result Date: 06/12/2023 CLINICAL DATA:  Pain after fall last night. Fall while transferring from wheelchair to toilet. EXAM: RIGHT ANKLE - COMPLETE 3+ VIEW; RIGHT FOOT COMPLETE - 3+ VIEW COMPARISON:  None Available. FINDINGS: Ankle: The bones are subjectively under  mineralized. No evidence of acute fracture. No dislocation. The ankle mortise is preserved. Mild chronic spurring about the medial malleolus. Chronic soft tissue calcifications in the lower leg. Foot: No evidence of acute fracture or dislocation. Hammertoe deformity of the toes. 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 Incidental os navicular. There is a moderate plantar calcaneal spur. Small Achilles tendon enthesophyte. IMPRESSION: 1. No acute fracture or dislocation of the right ankle or foot. 2. 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 Electronically Signed   By: Narda Rutherford M.D.   On: 06/12/2023 19:14   CT Cervical Spine Wo Contrast  Result Date: 06/12/2023 CLINICAL DATA:  Fall and head trauma. EXAM: CT HEAD WITHOUT CONTRAST CT CERVICAL SPINE WITHOUT CONTRAST TECHNIQUE: Multidetector CT imaging of the head and cervical spine was performed following the standard protocol without intravenous contrast. Multiplanar CT image reconstructions of the cervical spine were also generated. RADIATION DOSE REDUCTION: This exam was performed according to the departmental dose-optimization program which includes automated exposure control, adjustment of the mA and/or kV according to patient size and/or use of iterative reconstruction technique. COMPARISON:  None Available. FINDINGS: CT HEAD FINDINGS Brain: There is mild age-related atrophy and moderate chronic microvascular ischemic changes. There is no acute intracranial hemorrhage. No mass effect or midline shift. No extra-axial fluid collection. Vascular: No hyperdense vessel or unexpected calcification. Skull: Normal. Negative for fracture or focal lesion. Sinuses/Orbits: There is mucoperiosteal thickening of the left maxillary sinus. No air-fluid level. The remainder of the visualized paranasal sinuses and mastoid air cells are clear. Other: None CT CERVICAL SPINE FINDINGS Alignment: No  acute subluxation. Skull base and vertebrae: No acute fracture.  Osteopenia. Soft tissues and spinal canal: No prevertebral fluid or swelling. No visible canal hematoma. Disc levels:  No acute findings.  Degenerative changes. Upper chest: Negative. Other: Bilateral carotid bulb calcified plaques. IMPRESSION: 1. No acute intracranial pathology. Mild age-related atrophy and moderate chronic microvascular ischemic changes. 2. No acute/traumatic cervical spine pathology. Electronically Signed   By: Elgie Collard M.D.   On: 06/12/2023 19:13   CT Head Wo Contrast  Result Date: 06/12/2023 CLINICAL DATA:  Fall and head trauma. EXAM: CT HEAD WITHOUT CONTRAST CT CERVICAL SPINE WITHOUT CONTRAST TECHNIQUE: Multidetector CT imaging of the head and cervical spine was performed following the standard protocol without intravenous contrast. Multiplanar CT image reconstructions of  the cervical spine were also generated. RADIATION DOSE REDUCTION: This exam was performed according to the departmental dose-optimization program which includes automated exposure control, adjustment of the mA and/or kV according to patient size and/or use of iterative reconstruction technique. COMPARISON:  None Available. FINDINGS: CT HEAD FINDINGS Brain: There is mild age-related atrophy and moderate chronic microvascular ischemic changes. There is no acute intracranial hemorrhage. No mass effect or midline shift. No extra-axial fluid collection. Vascular: No hyperdense vessel or unexpected calcification. Skull: Normal. Negative for fracture or focal lesion. Sinuses/Orbits: There is mucoperiosteal thickening of the left maxillary sinus. No air-fluid level. The remainder of the visualized paranasal sinuses and mastoid air cells are clear. Other: None CT CERVICAL SPINE FINDINGS Alignment: No acute subluxation. Skull base and vertebrae: No acute fracture.  Osteopenia. Soft tissues and spinal canal: No prevertebral fluid or swelling. No visible canal  hematoma. Disc levels:  No acute findings.  Degenerative changes. Upper chest: Negative. Other: Bilateral carotid bulb calcified plaques. IMPRESSION: 1. No acute intracranial pathology. Mild age-related atrophy and moderate chronic microvascular ischemic changes. 2. No acute/traumatic cervical spine pathology. Electronically Signed   By: Elgie Collard M.D.   On: 06/12/2023 19:13   DG Ribs Unilateral W/Chest Right  Result Date: 06/12/2023 CLINICAL DATA:  Pain after fall last night. Fall while transferring from wheelchair to toilet. EXAM: RIGHT RIBS AND CHEST - 3+ VIEW COMPARISON:  Chest radiograph 08/06/2022. Occluded portion from cardiac CT 04/09/2019 FINDINGS: No evidence of acute right rib fracture. There are multiple remote healed right rib fractures are unchanged from prior exam. There is no evidence of pneumothorax or pleural effusion. Chronic elevation of right hemidiaphragm. Heart size and mediastinal contours are within normal limits. Aortic atherosclerosis. IMPRESSION: No evidence of acute right rib fracture.  No pulmonary complication. Multiple remote healed right rib fractures. Electronically Signed   By: Narda Rutherford M.D.   On: 06/12/2023 19:12    Procedures Procedures    Medications Ordered in ED Medications  HYDROcodone-acetaminophen (NORCO/VICODIN) 5-325 MG per tablet 1 tablet (1 tablet Oral Given 06/12/23 1824)    ED Course/ Medical Decision Making/ A&P Clinical Course as of 06/12/23 2308  Thu Jun 12, 2023  2305 BP(!): 108/51 Repeat blood pressure prior to discharge 118 systolic.  Heart rate in the 70s. [CR]    Clinical Course User Index [CR] Peter Garter, PA                                 Medical Decision Making Amount and/or Complexity of Data Reviewed Radiology: ordered.  Risk Prescription drug management.   This patient presents to the ED for concern of fall, this involves an extensive number of treatment options, and is a complaint that carries  with it a high risk of complications and morbidity.  The differential diagnosis includes CVA, fracture, strain/pain, dislocation, ligamentous/tendinous injury, neurovascular, muscular pneumothorax, solid organ damage, other   Co morbidities that complicate the patient evaluation  See HPI   Additional history obtained:  Additional history obtained from EMR External records from outside source obtained and reviewed including hospital records   Lab Tests:  N/a   Imaging Studies ordered:  I ordered imaging studies including CT head/cervical spine, chest x-ray with right ribs, right foot/ankle x-ray I independently visualized and interpreted imaging which showed  CT head/cervical spine: No acute intracranial pathology.  Mild age-related atrophy.  No acute/traumatic cervical spine pathology. Chest x-ray without ribs:  no acute fracture or acute cardiopulmonary abnormality.  Multiple rib fractures as above.. Right foot/ankle x-ray: No acute fracture since its location.  OA changes I agree with the radiologist interpretation  Cardiac Monitoring: / EKG:  The patient was maintained on a cardiac monitor.  I personally viewed and interpreted the cardiac monitored which showed an underlying rhythm of: Rhythm   Consultations Obtained:  N/a   Problem List / ED Course / Critical interventions / Medication management  Fall I ordered medication including Norco   Reevaluation of the patient after these medicines showed that the patient improved I have reviewed the patients home medicines and have made adjustments as needed   Social Determinants of Health:  Former cigarette use.  Denies illicit drug use.   Test / Admission - Considered:  Fall Vitals signs within normal range and stable throughout visit. Imaging studies significant for: See above 72 year old female presents emergency department after mechanical fall.  Mechanical fall occurred when her right foot/ankle got caught on  a object when she was transferring from her wheelchair to the toilet.  Patient with complaints of right-sided foot/ankle pain, right sided chest pain as well as with reported injury to head during fall.  On exam, patient with nonfocal neurologic exam but given age as well as concurrently being on Eliquis, CT imaging of the head and cervical spine was obtained.  CT imaging negative for any acute abnormality.  X-ray imaging obtained of patient's chest which is negative for any rib fracture or acute cardiopulmonary abnormality.  Additionally, patient imaging of right foot/ankle negative for fracture/dislocation.  Patient reassured by workup.  Recommend rest, ice, elevation, Tylenol for treatment of pain and follow-up with primary care in the outpatient setting.  Treatment plan discussed with at length with patient and she acknowledged understanding was agreeable to said plan.  Patient overall well-appearing, afebrile in no acute distress. Worrisome signs and symptoms were discussed with the patient, and the patient acknowledged understanding to return to the ED if noticed. Patient was stable upon discharge.          Final Clinical Impression(s) / ED Diagnoses Final diagnoses:  Fall, initial encounter    Rx / DC Orders ED Discharge Orders     None         Peter Garter, [place] 06/12/23 2308    Glyn Ade, MD 06/12/23 2310

## 2023-06-12 NOTE — ED Triage Notes (Addendum)
Pt to ED c/o fall that occurred last night. Pt tripped and fell while trying to transfer from wc to toilet. Pt takes Elequis. Pt reports she did hit head when fell, on a night stand. No LOC. Pt also c/o right ankle pain.not able to put weight, swelling  Pt A&O X 4 in triage

## 2023-06-15 DIAGNOSIS — R262 Difficulty in walking, not elsewhere classified: Secondary | ICD-10-CM | POA: Diagnosis not present

## 2023-06-17 ENCOUNTER — Encounter: Payer: Self-pay | Admitting: Neurology

## 2023-06-17 ENCOUNTER — Other Ambulatory Visit: Payer: Medicare PPO

## 2023-06-17 ENCOUNTER — Ambulatory Visit: Payer: Medicare PPO | Admitting: Neurology

## 2023-06-17 VITALS — BP 95/51 | Ht 64.0 in | Wt 182.0 lb

## 2023-06-17 DIAGNOSIS — E669 Obesity, unspecified: Secondary | ICD-10-CM | POA: Diagnosis not present

## 2023-06-17 DIAGNOSIS — G629 Polyneuropathy, unspecified: Secondary | ICD-10-CM | POA: Diagnosis not present

## 2023-06-17 DIAGNOSIS — H3530 Unspecified macular degeneration: Secondary | ICD-10-CM | POA: Diagnosis not present

## 2023-06-17 DIAGNOSIS — M48062 Spinal stenosis, lumbar region with neurogenic claudication: Secondary | ICD-10-CM | POA: Diagnosis not present

## 2023-06-17 DIAGNOSIS — F411 Generalized anxiety disorder: Secondary | ICD-10-CM | POA: Diagnosis not present

## 2023-06-17 DIAGNOSIS — F320 Major depressive disorder, single episode, mild: Secondary | ICD-10-CM | POA: Diagnosis not present

## 2023-06-17 DIAGNOSIS — E538 Deficiency of other specified B group vitamins: Secondary | ICD-10-CM | POA: Diagnosis not present

## 2023-06-17 DIAGNOSIS — N289 Disorder of kidney and ureter, unspecified: Secondary | ICD-10-CM | POA: Diagnosis not present

## 2023-06-17 DIAGNOSIS — I2510 Atherosclerotic heart disease of native coronary artery without angina pectoris: Secondary | ICD-10-CM | POA: Diagnosis not present

## 2023-06-17 DIAGNOSIS — Z882 Allergy status to sulfonamides status: Secondary | ICD-10-CM | POA: Diagnosis not present

## 2023-06-17 DIAGNOSIS — I4819 Other persistent atrial fibrillation: Secondary | ICD-10-CM | POA: Diagnosis not present

## 2023-06-17 DIAGNOSIS — Z6831 Body mass index (BMI) 31.0-31.9, adult: Secondary | ICD-10-CM | POA: Diagnosis not present

## 2023-06-17 DIAGNOSIS — Z7901 Long term (current) use of anticoagulants: Secondary | ICD-10-CM | POA: Diagnosis not present

## 2023-06-17 DIAGNOSIS — E559 Vitamin D deficiency, unspecified: Secondary | ICD-10-CM | POA: Diagnosis not present

## 2023-06-17 DIAGNOSIS — J309 Allergic rhinitis, unspecified: Secondary | ICD-10-CM | POA: Diagnosis not present

## 2023-06-17 DIAGNOSIS — Z833 Family history of diabetes mellitus: Secondary | ICD-10-CM | POA: Diagnosis not present

## 2023-06-17 DIAGNOSIS — E440 Moderate protein-calorie malnutrition: Secondary | ICD-10-CM | POA: Diagnosis not present

## 2023-06-17 DIAGNOSIS — K5900 Constipation, unspecified: Secondary | ICD-10-CM | POA: Diagnosis not present

## 2023-06-17 DIAGNOSIS — E785 Hyperlipidemia, unspecified: Secondary | ICD-10-CM | POA: Diagnosis not present

## 2023-06-17 DIAGNOSIS — G621 Alcoholic polyneuropathy: Secondary | ICD-10-CM

## 2023-06-17 DIAGNOSIS — I700 Atherosclerosis of aorta: Secondary | ICD-10-CM | POA: Diagnosis not present

## 2023-06-17 DIAGNOSIS — Z886 Allergy status to analgesic agent status: Secondary | ICD-10-CM | POA: Diagnosis not present

## 2023-06-17 DIAGNOSIS — R32 Unspecified urinary incontinence: Secondary | ICD-10-CM | POA: Diagnosis not present

## 2023-06-17 DIAGNOSIS — G479 Sleep disorder, unspecified: Secondary | ICD-10-CM | POA: Diagnosis not present

## 2023-06-17 DIAGNOSIS — I509 Heart failure, unspecified: Secondary | ICD-10-CM | POA: Diagnosis not present

## 2023-06-17 DIAGNOSIS — Z881 Allergy status to other antibiotic agents status: Secondary | ICD-10-CM | POA: Diagnosis not present

## 2023-06-17 DIAGNOSIS — E569 Vitamin deficiency, unspecified: Secondary | ICD-10-CM | POA: Diagnosis not present

## 2023-06-17 NOTE — Patient Instructions (Addendum)
Check labs  Referral provided for right ankle foot orthotic  Follow-up with Dr. Jake Samples regarding spinal canal stenosis

## 2023-06-17 NOTE — Progress Notes (Signed)
[facility]rology [facility] Clinic Note - Initial Visit   Date: 06/17/2023   [name] MRN: [id number] DOB: 03/20/1951   Dear Dr. Jake Samples:  Thank you for your kind referral of Leslie [name] for consultation of neuropathy. Although her history is well known to you, please allow Leslie [name] to reiterate it for the purpose of our medical record. The patient was accompanied to the clinic by daughter who also provides collateral information.     Leslie [name] is a 72 y.o. right-handed female with atrial fibrillation, ADHD, hyperlipidemia, diabetes mellitus, anxiety/depression, and hypertension presenting for evaluation of neuropathy.   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   EMG results were reviewed. Neuropathy would explain her distal symptoms in the feet, however, neuropathy would not explain her inability to stand/walk especially when her motor strength proximally is intact.  I think her lumbar canal stenosis at L5-S1 may be contributing to her exertional leg heaviness which occurs with standing and walking.  I would like Dr. Mattie Marlin opinion on this.  For her neuropathy, I will check ESR, CRP, vitamin B12, folate, vitamin B1, copper, SPEP with IFE, TSH.  Etiology is most likely alcohol.  I recommend abstaining from alcohol.  For right foot drop, referral provided for right foot AFO   Return to clinic in 3 months  ------------------------------------------------------------- History of present illness: She fell in June 2024 fractured her sacrum and has been unable to walks since then.  She is able to stand to transfer, but quickly her legs feel shaky as if they cannot support her, so she prefers not to walk. She does have leg heaviness and buckling sensation if she walks.  She does not have leg weakness.  She has been doing physical therapy which has helped.  Starting in June 2024, she began having  achy, stinging pain in the feet.   She takes gabapentin for this.  EMG performed at [facility] showed axonal sensorimotor neuropathy. She drinks a bottle of wine nightly x 3 years.  No diabetes or history of chemotherapy exposure.    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  She lives at home in a one-level home.  Daughter comes during the daytime and took leave of absence to take care of her.   Out-side paper records, electronic medical record, and images have been reviewed where available and summarized as:  MRI lumbar spine 01/02/2023: 1. Sacral fractures are better seen on the same-day CT lumbar spine. No acute fracture in the lumbar spine. 2. L5-S1 severe spinal canal stenosis. Effacement of the lateral recesses at this level likely compresses the descending S1 nerve roots. 3. L4-L5 moderate spinal canal stenosis. Narrowing of the lateral recesses at this level could affect the descending L5 nerve roots. 4. L3-L4 mild to moderate spinal canal stenosis and mild right neural foraminal narrowing. Narrowing of the lateral recesses at this level could affect the descending L4 nerve roots.  MRI lumbar spine wo contrast 06/05/2023: 1. At L4-5 there is a minimal broad-based disc bulge. Severe bilateral facet arthropathy. Severe spinal stenosis. No foraminal stenosis. 2. Otherwise, lumbar spine spondylosis as described above. 3.  No acute osseous injury of the lumbar spine.   Lab Results  Component Value Date   HGBA1C 5.3 03/13/2021   Lab Results  Component Value Date   VITAMINB12 >7,500 (H) 01/28/2023   Lab Results  Component Value Date   TSH 1.820 08/30/2022   No results  found for: "ESRSEDRATE", "POCTSEDRATE"  Past Medical History:  Diagnosis Date   ADD (attention deficit disorder)    Allergy    Anemia    Arthritis 07/22/2014   osteoarthritis left knee   Colon polyps    Hypertension    Left knee pain    chronic   Narcotic addiction (HCC)     Obesity    post gastric bypass   Vitamin D deficiency     Past Surgical History:  Procedure Laterality Date   arthscopic knee surgery Left    several times   ATRIAL FIBRILLATION ABLATION N/A 04/11/2023   Procedure: ATRIAL FIBRILLATION ABLATION;  Surgeon: Maurice Small, MD;  [facility];  Service: Cardiovascular;  Laterality: N/A;   BREAST BIOPSY     BREAST SURGERY  years ago   breast biopsy   CHOLECYSTECTOMY     EYE SURGERY Bilateral 2013   Toric Lens implants   EYE SURGERY Right 2014   corneal amniotic membrane   GASTRIC BYPASS  2003   IR SACROPLASTY BILATERAL  01/07/2023   KIDNEY DONATION  2004   TOTAL KNEE ARTHROPLASTY Left 06/16/2015   Procedure: LEFT TOTAL KNEE ARTHROPLASTY;  Surgeon: Kathryne Hitch, MD;  Location: [facility];  Service: Orthopedics;  Laterality: Left;   TOTAL KNEE ARTHROPLASTY Right 05/04/2021   Procedure: RIGHT TOTAL KNEE ARTHROPLASTY;  Surgeon: Kathryne Hitch, MD;  Location: [facility];  Service: Orthopedics;  Laterality: Right;     Medications:  Outpatient Encounter Medications as of 06/17/2023  Medication Sig Note   ALPRAZolam (XANAX) 0.25 MG tablet Take 1 tablet (0.25 mg total) by mouth at bedtime as needed for anxiety. TAKE 1 TABLET(0.25 MG) BY MOUTH AT BEDTIME AS NEEDED FOR ANXIETY Strength: 0.25 mg    amiodarone (PACERONE) 200 MG tablet Take 200 mg by mouth daily.    amphetamine-dextroamphetamine (ADDERALL XR) 30 MG 24 hr capsule Take 30 mg by mouth daily.    apixaban (ELIQUIS) 5 MG TABS tablet Take 1 tablet (5 mg total) by mouth 2 (two) times daily.    atorvastatin (LIPITOR) 40 MG tablet Take 40 mg by mouth daily.    empagliflozin (JARDIANCE) 10 MG TABS tablet Take 1 tablet (10 mg total) by mouth daily before breakfast.    FLUoxetine (PROZAC) 40 MG capsule TAKE 1 CAPSULE(40 MG) BY MOUTH EVERY MORNING    furosemide (LASIX) 40 MG tablet Take 20 mg by mouth every other day. Take one half (0.5) tablet by mouth ( 20 mg)  as needed for wt gain of 3 lbs in 24 hours or 5 lbs in one week.    gabapentin (NEURONTIN) 300 MG capsule TAKE 1 CAPSULE BY MOUTH THREE TIMES DAILY (Patient taking differently: Take one  capsule in the morning, one capsule in the afternoon, and two capsules in the evening.)    lamoTRIgine (LAMICTAL) 100 MG tablet TAKE 1 TABLET(100 MG) BY MOUTH TWICE DAILY (Patient taking differently: Take 100 mg by mouth 2 (two) times daily.)    losartan (COZAAR) 25 MG tablet Take 25 mg by mouth daily. Patient has only been taking 1/2 tablet daily.    melatonin 3 MG TABS tablet Take 1 tablet (3 mg total) by mouth at bedtime.    metoprolol succinate (TOPROL XL) 25 MG 24 hr tablet Take 0.5 tablets (12.5 mg total) by mouth daily.    Multiple Vitamins-Minerals (PRESERVISION AREDS 2) CAPS Take 1 capsule by mouth 2 (two) times daily.    polyethylene glycol (MIRALAX / GLYCOLAX)  17 g packet Take 17 g by mouth daily as needed.    potassium chloride (KLOR-CON M) 10 MEQ tablet Take 10 mEq by mouth daily.    senna-docusate (SENOKOT-S) 8.6-50 MG tablet Take 2 tablets by mouth 2 (two) times daily. (Patient taking differently: Take 1 tablet by mouth every other day.)    spironolactone (ALDACTONE) 25 MG tablet Take 0.5 tablets (12.5 mg total) by mouth daily.    Vitamin D, Ergocalciferol, (DRISDOL) 1.25 MG (50000 UNIT) CAPS capsule Take 1 capsule (50,000 Units total) by mouth every 7 (seven) days. 04/03/2023: Thursdays    HYDROcodone-acetaminophen (NORCO/VICODIN) 5-325 MG tablet Take 1 tablet by mouth every 6 (six) hours as needed. (Patient not taking: Reported on 06/17/2023)    No facility-administered encounter medications on file as of 06/17/2023.    Allergies:  Allergies  Allergen Reactions   Amoxicillin     Hives   Nsaids     Due Gastric Bypass   Sulfamethoxazole Nausea And Vomiting    See patient list of med intolerances due to h/o gastric bypass and nephrectomy    Family History: Family History  Problem Relation  Age of Onset   Heart disease Mother        post CABG history of CHF   COPD Father    Diabetes Sister    Hypertension Sister        post renal transplant   Heart disease Brother        CAD    Social History: Social History   Tobacco Use   Smoking status: Former    Current packs/day: 0.00    Types: Cigarettes    Quit date: 07/22/1992    Years since quitting: 30.9   Smokeless tobacco: Never  Vaping Use   Vaping status: Never Used  Substance Use Topics   Alcohol use: Yes    Comment: wine weekly   Drug use: No   Social History   Social History Narrative   Are you right handed or left handed? Right Handed    Are you currently employed ? No    What is your current occupation? Retired   Do you live at home alone? No    Who lives with you? Daughter stays with her    What type of home do you live in: 1 story or 2 story? Lives in a one story home        Vital Signs:  BP (!) 95/51   Ht 5\' 4"  (1.626 m)   Wt 182 lb (82.6 kg)   SpO2 97%   BMI 31.24 kg/m   Neurological Exam: MENTAL STATUS including orientation to time, place, person, recent and remote memory, attention span and concentration, language, and fund of knowledge is normal.  Speech is not dysarthric.  CRANIAL NERVES: II:  No visual field defects.     III-IV-VI: Pupils equal round and reactive to light.  Normal conjugate, extra-ocular eye movements in all directions of gaze.  No nystagmus.  No ptosis.   V:  Normal facial sensation.    VII:  Normal facial symmetry and movements.   VIII:  Normal hearing and vestibular function.   IX-X:  Normal palatal movement.   XI:  Normal shoulder shrug and head rotation.   XII:  Normal tongue strength and range of motion, no deviation or fasciculation.  MOTOR:  No atrophy, fasciculations or abnormal movements.  No pronator drift.   Upper Extremity:  Right  Left  Deltoid  5/5   5/5  Biceps  5/5   5/5   Triceps  5/5   5/5   Wrist extensors  5/5   5/5   Wrist flexors  5/5    5/5   Finger extensors  5/5   5/5   Finger flexors  5/5   5/5   Dorsal interossei  5/5   5/5   Abductor pollicis  5/5   5/5   Tone (Ashworth scale)  0  0   Lower Extremity:  Right  Left  Hip flexors  5/5   5/5   Adductor 5/5  5/5  Abductor 5/5  5/5  Knee flexors  5/5   5/5   Knee extensors  5/5   5/5   Dorsiflexors  3/5   4/5   Plantarflexors  4/5   4/5   Toe extensors  2/5   4/5   Toe flexors  4/5   4/5   Tone (Ashworth scale)  0  0   MSRs:                                           Right        Left brachioradialis 2+  2+  biceps 2+  2+  triceps 2+  2+  patellar 3+  3+  ankle jerk 0  0  Hoffman no  no  plantar response mute  mute  Crossed adductor present  SENSORY: Reduced vibration at the right great toe. Vibration intact at the left.  Pin prick and temperature reduced at the feet.   COORDINATION/GAIT: Normal finger-to- nose-finger.  Intact rapid alternating movements bilaterally.  Gait not tested, patient in wheelchair.   Thank you for allowing me to participate in patient's care.  If I can answer any additional questions, I would be pleased to do so.    Sincerely,    Korbyn Vanes K. Allena Katz, DO

## 2023-06-24 ENCOUNTER — Other Ambulatory Visit: Payer: Self-pay

## 2023-06-24 DIAGNOSIS — G621 Alcoholic polyneuropathy: Secondary | ICD-10-CM

## 2023-06-24 LAB — B12 AND FOLATE PANEL
Folate: 7.7 ng/mL
Vitamin B-12: 1440 pg/mL — ABNORMAL HIGH (ref 200–1100)

## 2023-06-24 LAB — PROTEIN ELECTROPHORESIS, SERUM
Albumin ELP: 3.5 g/dL — ABNORMAL LOW (ref 3.8–4.8)
Alpha 1: 0.3 g/dL (ref 0.2–0.3)
Alpha 2: 0.6 g/dL (ref 0.5–0.9)
Beta 2: 0.3 g/dL (ref 0.2–0.5)
Beta Globulin: 0.4 g/dL (ref 0.4–0.6)
Gamma Globulin: 0.6 g/dL — ABNORMAL LOW (ref 0.8–1.7)
Total Protein: 5.8 g/dL — ABNORMAL LOW (ref 6.1–8.1)

## 2023-06-24 LAB — VITAMIN B1: Vitamin B1 (Thiamine): 26 nmol/L (ref 8–30)

## 2023-06-24 LAB — IMMUNOFIXATION ELECTROPHORESIS
IgG (Immunoglobin G), Serum: 684 mg/dL (ref 600–1540)
IgM, Serum: 67 mg/dL (ref 50–300)
Immunoglobulin A: 266 mg/dL (ref 70–320)

## 2023-06-24 LAB — C-REACTIVE PROTEIN: CRP: <3 mg/L (ref <8.0)

## 2023-06-24 LAB — COPPER, SERUM: Copper: 30 ug/dL — ABNORMAL LOW (ref 70–175)

## 2023-06-24 LAB — TSH: TSH: 5.48 m[IU]/L — ABNORMAL HIGH (ref 0.40–4.50)

## 2023-06-24 LAB — SEDIMENTATION RATE: Sed Rate: 6 mm/h (ref 0–30)

## 2023-06-26 ENCOUNTER — Other Ambulatory Visit: Payer: Self-pay | Admitting: Internal Medicine

## 2023-06-27 ENCOUNTER — Other Ambulatory Visit: Payer: Self-pay | Admitting: Internal Medicine

## 2023-06-30 ENCOUNTER — Other Ambulatory Visit (HOSPITAL_BASED_OUTPATIENT_CLINIC_OR_DEPARTMENT_OTHER): Payer: Self-pay

## 2023-06-30 MED ORDER — AMPHETAMINE-DEXTROAMPHET ER 30 MG PO CP24
30.0000 mg | ORAL_CAPSULE | Freq: Every day | ORAL | 0 refills | Status: DC
  Filled 2023-06-30: qty 30, 30d supply, fill #0

## 2023-07-03 ENCOUNTER — Other Ambulatory Visit: Payer: Medicare PPO

## 2023-07-03 DIAGNOSIS — N179 Acute kidney failure, unspecified: Secondary | ICD-10-CM | POA: Diagnosis not present

## 2023-07-03 DIAGNOSIS — M48062 Spinal stenosis, lumbar region with neurogenic claudication: Secondary | ICD-10-CM | POA: Diagnosis not present

## 2023-07-03 DIAGNOSIS — G479 Sleep disorder, unspecified: Secondary | ICD-10-CM | POA: Diagnosis not present

## 2023-07-03 DIAGNOSIS — Z9189 Other specified personal risk factors, not elsewhere classified: Secondary | ICD-10-CM | POA: Diagnosis not present

## 2023-07-03 DIAGNOSIS — Z5181 Encounter for therapeutic drug level monitoring: Secondary | ICD-10-CM | POA: Diagnosis not present

## 2023-07-03 DIAGNOSIS — G621 Alcoholic polyneuropathy: Secondary | ICD-10-CM | POA: Diagnosis not present

## 2023-07-03 DIAGNOSIS — E440 Moderate protein-calorie malnutrition: Secondary | ICD-10-CM | POA: Diagnosis not present

## 2023-07-07 ENCOUNTER — Encounter: Payer: Self-pay | Admitting: Internal Medicine

## 2023-07-07 ENCOUNTER — Ambulatory Visit: Payer: Medicare PPO | Admitting: Cardiovascular Disease

## 2023-07-07 LAB — T3, FREE: T3, Free: 2.5 pg/mL (ref 2.3–4.2)

## 2023-07-07 LAB — T4, FREE: Free T4: 1.2 ng/dL (ref 0.8–1.8)

## 2023-07-07 LAB — TEST AUTHORIZATION: TEST CODE:: 326

## 2023-07-07 LAB — CERULOPLASMIN: Ceruloplasmin: 12 mg/dL — ABNORMAL LOW (ref 14–48)

## 2023-07-15 DIAGNOSIS — R262 Difficulty in walking, not elsewhere classified: Secondary | ICD-10-CM | POA: Diagnosis not present

## 2023-07-21 ENCOUNTER — Encounter: Payer: Self-pay | Admitting: Cardiovascular Disease

## 2023-07-21 ENCOUNTER — Ambulatory Visit: Payer: Medicare PPO | Attending: Cardiovascular Disease | Admitting: Cardiovascular Disease

## 2023-07-21 VITALS — BP 116/74 | HR 42 | Ht 64.0 in | Wt 190.6 lb

## 2023-07-21 DIAGNOSIS — I4819 Other persistent atrial fibrillation: Secondary | ICD-10-CM | POA: Diagnosis not present

## 2023-07-21 DIAGNOSIS — I5022 Chronic systolic (congestive) heart failure: Secondary | ICD-10-CM

## 2023-07-21 DIAGNOSIS — I493 Ventricular premature depolarization: Secondary | ICD-10-CM | POA: Diagnosis not present

## 2023-07-21 NOTE — Progress Notes (Signed)
  Electrophysiology Office Note:    Date:  07/21/2023   ID:  Leslie [name], Leslie [name] [birth year], MRN [id number]  PCP:  Philip Aspen, Limmie Patricia, MD   [facility] Providers Cardiologist:  None Electrophysiologist:  Maurice Small, MD     Referring MD: Philip Aspen, Estel*   History of Present Illness:    Leslie [name] is a 72 y.o. female with a hx listed below, significant for gastric bypass, nephrectomy, CHFrEF, and atrial fibrillation  referred for arrhythmia management.  She was seen in the ER in January, 2024 with new-onset CHF. ECG on 1/19 frequent PACs, possibly MAT. She has increased shortness of breath, poor energy attributable to heart failure. She does not have palpitations, syncope, pre-syncope.  She underwent RFA atrial fibrillation ablation in September 2024.  Repeat echocardiogram in November showed that her ejection fraction has completely recovered.    EKGs/Labs/Other Studies Reviewed Today:    Echocardiogram:  TTE 08/29/22 EF 25-30%, moderately dilated LV. LA moderately dilated         TTE 05/29/2023 EF 55 to 60%.  Mildly dilated left atrium.  Mild mitral valve regurgitation.       Monitors:  Zio 07/2022 Sinus rhythm HR 79-124, avg 124 AF 36%, Avg HR 47 BPM  Stress testing:    Advanced imaging:    EKG:  Last EKG results: today - sinus bradycardia   Recent Labs: 09/12/2022: NT-Pro BNP 1,775 12/31/2022: B Natriuretic Peptide 704.0 01/08/2023: Magnesium 2.2 04/08/2023: ALT 19; BUN 14; Creatinine, Ser 1.24; Hemoglobin 10.8; Platelets 331; Potassium 4.5; Sodium 137 06/17/2023: TSH 5.48     Physical Exam:    VS:  BP 116/74 (BP Location: Right Arm, Patient Position: Sitting, Cuff Size: Large)   Pulse (!) 42   Ht 5\' 4"  (1.626 m)   Wt 190 lb 9.6 oz (86.5 kg)   SpO2 96%   BMI 32.72 kg/m     Wt Readings from Last 3 Encounters:  07/21/23 190 lb 9.6 oz (86.5 kg)  06/17/23 182 lb (82.6 kg)  06/12/23 182 lb (82.6 kg)     GEN:   Well nourished, well developed in no acute distress CARDIAC: RRR, no murmurs, rubs, gallops RESPIRATORY:  Normal work of breathing MUSCULOSKELETAL: trace edema    ASSESSMENT & PLAN:    CHF recovered EF Appears compensated today EF has recovered   Paroxysmal AF  Burden 36% Minimally symptomatic Appears to be maintaining sinus after ablation 03/2023 I recommended that she check her rhythm daily with her Kardiomobile device Continue apixaban 5mg  PO BID   Frequent PVCs Burden about 9%. -- Unlikely to cause CHF Will reassess in follow-up off amiodarone  Solitary kidney GFR only slightly decreased Will defer additional GDMT to advanced CHF          I reviewed notes by Berline Lopes and Dr. [name].  Medication Adjustments/Labs and Tests Ordered: Current medicines are reviewed at length with the patient today.  Concerns regarding medicines are outlined above.  Orders Placed This Encounter  Procedures   EKG 12-Lead   No orders of the defined types were placed in this encounter.    Signed, Maurice Small, MD  07/21/2023 3:51 PM    [facility]

## 2023-07-21 NOTE — Patient Instructions (Signed)
Medication Instructions:  STOP Amiodarone *If you need a refill on your cardiac medications before your next appointment, please call your pharmacy*   Follow-Up: At [facility], you and your health needs are our priority.  As part of our continuing mission to provide you with exceptional heart care, we have created designated Provider Care Teams.  These Care Teams include your primary Cardiologist (physician) and Advanced Practice Providers (APPs -  Physician Assistants and Nurse Practitioners) who all work together to provide you with the care you need, when you need it.  We recommend signing up for the patient portal called "MyChart".  Sign up information is provided on this After Visit Summary.  MyChart is used to connect with patients for Virtual Visits (Telemedicine).  Patients are able to view lab/test results, encounter notes, upcoming appointments, etc.  Non-urgent messages can be sent to your provider as well.   To learn more about what you can do with MyChart, go to [email or web address].    Your next appointment:   6 month(s)  Provider:   You will see one of the following Advanced Practice Providers on your designated Care Team:   Francis Dowse, Charlott Holler [address] [place], [place] Sherie Don, NP Canary Brim, NP

## 2023-07-22 ENCOUNTER — Encounter: Payer: Self-pay | Admitting: Internal Medicine

## 2023-07-22 ENCOUNTER — Telehealth (INDEPENDENT_AMBULATORY_CARE_PROVIDER_SITE_OTHER): Payer: Medicare PPO | Admitting: Internal Medicine

## 2023-07-22 DIAGNOSIS — Z1231 Encounter for screening mammogram for malignant neoplasm of breast: Secondary | ICD-10-CM

## 2023-07-22 DIAGNOSIS — Z780 Asymptomatic menopausal state: Secondary | ICD-10-CM | POA: Diagnosis not present

## 2023-07-22 DIAGNOSIS — G479 Sleep disorder, unspecified: Secondary | ICD-10-CM

## 2023-07-22 DIAGNOSIS — F900 Attention-deficit hyperactivity disorder, predominantly inattentive type: Secondary | ICD-10-CM

## 2023-07-22 MED ORDER — AMPHETAMINE-DEXTROAMPHET ER 30 MG PO CP24: 30.0000 mg | ORAL_CAPSULE | Freq: Every day | ORAL | 0 refills | Status: DC

## 2023-07-22 MED ORDER — ZOLPIDEM TARTRATE 5 MG PO TABS: 5.0000 mg | ORAL_TABLET | Freq: Every evening | ORAL | 2 refills | Status: DC | PRN

## 2023-07-22 NOTE — Progress Notes (Signed)
 Virtual Visit via Video Note  I connected with Leslie [name] on 07/22/23 at 11:30 AM EST by a video enabled telemedicine application and verified that I am speaking with the correct person using two identifiers.  Location patient: home Location provider: work office Persons participating in the virtual visit: patient, provider  I discussed the limitations of evaluation and management by telemedicine and the availability of in person appointments. The patient expressed understanding and agreed to proceed.   HPI: Requesting refills on Adderall  and Ambien . These meds had been discontinued in August over concerns of increased somnolence and falls. Since discontinuing, she has been more tired, having difficulty completing tasks, finds herself just staying at home as she has no energy/drive to do tasks. Has only been sleeping 4 hours since discontinuing Ambien .   ROS: Negative unless indicated in HPI.  Past Medical History:  Diagnosis Date   ADD (attention deficit disorder)    Allergy    Anemia    Arthritis 07/22/2014   osteoarthritis left knee   Colon polyps    Hypertension    Left knee pain    chronic   Narcotic addiction (HCC)    Obesity    post gastric bypass   Vitamin D  deficiency     Past Surgical History:  Procedure Laterality Date   arthscopic knee surgery Left    several times   ATRIAL FIBRILLATION ABLATION N/A 04/11/2023   Procedure: ATRIAL FIBRILLATION ABLATION;  Surgeon: Nancey Eulas BRAVO, MD;  [facility];  Service: Cardiovascular;  Laterality: N/A;   BREAST BIOPSY     BREAST SURGERY  years ago   breast biopsy   CHOLECYSTECTOMY     EYE SURGERY Bilateral 2013   Toric Lens implants   EYE SURGERY Right 2014   corneal amniotic membrane   GASTRIC BYPASS  2003   IR SACROPLASTY BILATERAL  01/07/2023   KIDNEY DONATION  2004   TOTAL KNEE ARTHROPLASTY Left 06/16/2015   Procedure: LEFT TOTAL KNEE ARTHROPLASTY;  Surgeon: Lonni CINDERELLA Poli, MD;  Location: [facility];  Service: Orthopedics;  Laterality: Left;   TOTAL KNEE ARTHROPLASTY Right 05/04/2021   Procedure: RIGHT TOTAL KNEE ARTHROPLASTY;  Surgeon: Poli Lonni CINDERELLA, MD;  Location: [facility];  Service: Orthopedics;  Laterality: Right;    Family History  Problem Relation Age of Onset   Heart disease Mother        post CABG history of CHF   COPD Father    Diabetes Sister    Hypertension Sister        post renal transplant   Heart disease Brother        CAD    SOCIAL HX:   reports that she quit smoking about 31 years ago. Her smoking use included cigarettes. She has never used smokeless tobacco. She reports current alcohol  use. She reports that she does not use drugs.   Current Outpatient Medications:    ALPRAZolam  (XANAX ) 0.25 MG tablet, Take 1 tablet (0.25 mg total) by mouth at bedtime as needed for anxiety. TAKE 1 TABLET(0.25 MG) BY MOUTH AT BEDTIME AS NEEDED FOR ANXIETY Strength: 0.25 mg, Disp: 30 tablet, Rfl: 0   apixaban  (ELIQUIS ) 5 MG TABS tablet, Take 1 tablet (5 mg total) by mouth 2 (two) times daily., Disp: 180 tablet, Rfl: 3   atorvastatin  (LIPITOR) 40 MG tablet, Take 40 mg by mouth daily., Disp: , Rfl:    empagliflozin  (JARDIANCE ) 10 MG TABS tablet, Take 1 tablet (10 mg total)  by mouth daily before breakfast., Disp: 90 tablet, Rfl: 3   FLUoxetine  (PROZAC ) 40 MG capsule, TAKE 1 CAPSULE(40 MG) BY MOUTH EVERY MORNING, Disp: 90 capsule, Rfl: 1   furosemide  (LASIX ) 40 MG tablet, Take 20 mg by mouth every other day. Take one half (0.5) tablet by mouth ( 20 mg) as needed for wt gain of 3 lbs in 24 hours or 5 lbs in one week., Disp: , Rfl:    gabapentin  (NEURONTIN ) 300 MG capsule, TAKE 1 CAPSULE BY MOUTH THREE TIMES DAILY (Patient taking differently: Take one  capsule in the morning, one capsule in the afternoon, and two capsules in the evening.), Disp: 90 capsule, Rfl: 2   lamoTRIgine  (LAMICTAL ) 100 MG tablet, TAKE 1 TABLET(100 MG) BY MOUTH TWICE DAILY  (Patient taking differently: Take 100 mg by mouth 2 (two) times daily.), Disp: 180 tablet, Rfl: 1   losartan  (COZAAR ) 25 MG tablet, Take 25 mg by mouth daily. Patient has only been taking 1/2 tablet daily., Disp: , Rfl:    melatonin 3 MG TABS tablet, Take 1 tablet (3 mg total) by mouth at bedtime., Disp: , Rfl: 0   metoprolol  succinate (TOPROL  XL) 25 MG 24 hr tablet, Take 0.5 tablets (12.5 mg total) by mouth daily., Disp: 15 tablet, Rfl: 3   Multiple Vitamins-Minerals (PRESERVISION AREDS 2) CAPS, Take 1 capsule by mouth 2 (two) times daily., Disp: , Rfl:    polyethylene glycol (MIRALAX  / GLYCOLAX ) 17 g packet, Take 17 g by mouth daily as needed., Disp: 14 each, Rfl: 0   potassium chloride  (KLOR-CON  M) 10 MEQ tablet, Take 10 mEq by mouth daily., Disp: , Rfl:    senna-docusate (SENOKOT-S) 8.6-50 MG tablet, Take 2 tablets by mouth 2 (two) times daily. (Patient taking differently: Take 1 tablet by mouth every other day.), Disp: , Rfl:    spironolactone  (ALDACTONE ) 25 MG tablet, Take 0.5 tablets (12.5 mg total) by mouth daily., Disp: 30 tablet, Rfl: 5   zolpidem  (AMBIEN ) 5 MG tablet, Take 1 tablet (5 mg total) by mouth at bedtime as needed for sleep., Disp: 30 tablet, Rfl: 2   amphetamine -dextroamphetamine  (ADDERALL  XR) 30 MG 24 hr capsule, Take 1 capsule (30 mg total) by mouth daily., Disp: 30 capsule, Rfl: 0   amphetamine -dextroamphetamine  (ADDERALL  XR) 30 MG 24 hr capsule, Take 1 capsule (30 mg total) by mouth daily., Disp: 30 capsule, Rfl: 0   amphetamine -dextroamphetamine  (ADDERALL  XR) 30 MG 24 hr capsule, Take 1 capsule (30 mg total) by mouth daily., Disp: 30 capsule, Rfl: 0   HYDROcodone -acetaminophen  (NORCO/VICODIN) 5-325 MG tablet, Take 1 tablet by mouth every 6 (six) hours as needed. (Patient not taking: Reported on 06/17/2023), Disp: 2 tablet, Rfl: 0  EXAM:   VITALS per patient if applicable: none reported  GENERAL: alert, oriented, appears well and in no acute distress  HEENT: atraumatic,  conjunttiva clear, no obvious abnormalities on inspection of external nose and ears  NECK: normal movements of the head and neck  LUNGS: on inspection no signs of respiratory distress, breathing rate appears normal, no obvious gross increased work of breathing, gasping or wheezing  CV: no obvious cyanosis  MS: moves all visible extremities without noticeable abnormality  PSYCH/NEURO: pleasant and cooperative, no obvious depression or anxiety, speech and thought processing grossly intact  ASSESSMENT AND PLAN:   Encounter for screening mammogram for malignant neoplasm of breast - Plan: MM Digital Screening  Attention deficit hyperactivity disorder (ADHD), predominantly inattentive type - Plan: amphetamine -dextroamphetamine  (ADDERALL  XR) 30 MG 24  hr capsule, amphetamine -dextroamphetamine  (ADDERALL  XR) 30 MG 24 hr capsule, amphetamine -dextroamphetamine  (ADDERALL  XR) 30 MG 24 hr capsule  Sleep disorder - Plan: zolpidem  (AMBIEN ) 5 MG tablet  Postmenopausal estrogen deficiency - Plan: DG Bone Density  -PDMP reviewed, no red flags, ORS is 70. -I will refill adderall  XR 30 mg daily and ambien  5 mg to take at bedtime as needed. 3 months of each provided.   I discussed the assessment and treatment plan with the patient. The patient was provided an opportunity to ask questions and all were answered. The patient agreed with the plan and demonstrated an understanding of the instructions.   The patient was advised to call back or seek an in-person evaluation if the symptoms worsen or if the condition fails to improve as anticipated.    Leslie Theophilus Andrews, MD  [facility]

## 2023-07-24 ENCOUNTER — Other Ambulatory Visit: Payer: Self-pay | Admitting: Internal Medicine

## 2023-07-24 DIAGNOSIS — G479 Sleep disorder, unspecified: Secondary | ICD-10-CM

## 2023-07-28 ENCOUNTER — Ambulatory Visit: Payer: Medicare PPO | Admitting: Neurology

## 2023-07-29 ENCOUNTER — Other Ambulatory Visit: Payer: Self-pay

## 2023-07-29 ENCOUNTER — Encounter: Payer: Self-pay | Admitting: Internal Medicine

## 2023-07-29 DIAGNOSIS — R790 Abnormal level of blood mineral: Secondary | ICD-10-CM

## 2023-07-29 DIAGNOSIS — G621 Alcoholic polyneuropathy: Secondary | ICD-10-CM

## 2023-07-29 NOTE — Telephone Encounter (Signed)
 Please send generic adderall xr to  [facility] Phone: [phone]  Fax: [phone]

## 2023-07-29 NOTE — Telephone Encounter (Signed)
**Note De-identified  [place] Obfuscation** Please advise 

## 2023-07-29 NOTE — Telephone Encounter (Signed)
 Copied from CRM [id number]. Topic: General - Other >> Jul 28, 2023  4:56 PM Eunice Blase wrote: Reason for CRM: Current pharmacy has been out please send amphetamine-dextroamphetamine (ADDERALL XR) 30 MG 24 hr capsule [facility].

## 2023-07-30 MED ORDER — AMPHETAMINE-DEXTROAMPHETAMINE 20 MG PO TABS: 20.0000 mg | ORAL_TABLET | Freq: Every day | ORAL | 0 refills | Status: DC

## 2023-08-15 DIAGNOSIS — R262 Difficulty in walking, not elsewhere classified: Secondary | ICD-10-CM | POA: Diagnosis not present

## 2023-08-20 DIAGNOSIS — M21372 Foot drop, left foot: Secondary | ICD-10-CM | POA: Diagnosis not present

## 2023-08-20 DIAGNOSIS — M48062 Spinal stenosis, lumbar region with neurogenic claudication: Secondary | ICD-10-CM | POA: Diagnosis not present

## 2023-08-25 ENCOUNTER — Other Ambulatory Visit: Payer: Self-pay | Admitting: Internal Medicine

## 2023-08-25 DIAGNOSIS — G479 Sleep disorder, unspecified: Secondary | ICD-10-CM

## 2023-08-26 MED ORDER — ALPRAZOLAM 0.25 MG PO TABS: 0.2500 mg | ORAL_TABLET | Freq: Every evening | ORAL | 0 refills | Status: DC | PRN

## 2023-08-26 NOTE — Progress Notes (Signed)
 Chief Complaint: low copper  and low ceruloplasmin, please eval for Wilson disease  Primary GI Doctor: Dr. Leigh   HPI:  Patient is a 73 year old female/female patient with past medical history of  atrial fibrillation, ADHD, hyperlipidemia, diabetes mellitus, anxiety/depression, and hypertension, who was referred to me by neurologist Dr. Staci [name], on 07/29/23 for a complaint of low copper  and low ceruloplasmin, please eval for Wilson disease.    On 06/17/2023 patient seen by neurology for evaluation of neuropathy.  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  Patient also has lumbar canal stenosis at L5-S1 which [name] be contributing to her leg heaviness.  At that time Dr. Blanch ordered ESR, CRP, vitamin B12, folate, B1, copper .  Per note patient drinks a bottle of wine nightly for the past 3 years.  Interval History    Patient presents today for follow-up on recent labwork with her neurologist that revealed low copper  and ceruloplasmin,accompanied by her daughter. She states she was being evaluated for lower extremity weakness and neuropathy. Her daughter states back in June of 2024 she suffered a fall and had 3 fractures in her sacrum requiring surgery. Since then she has been wheelchair bound due to unsteadiness on her feet. Patient reports twitching in her legs, arms, and shoulders. No slurred speech or dystonia. History of depression, no change in baseline. Regular eye exams. Has beginning stages of macular degeneration. Patient denies nausea, vomiting, or weight loss. Patient denies altered bowel habits, abdominal pain, or rectal bleeding. Patient did have gastric bypass surgery in 2003. Patient has had some vitamin D  and B12 deficiency and takes supplements as needed. No history of liver disease. No family history of liver disease or Wilson's. Former smoker, stopped in 1991. Drinks 1 bottle of wine per day. She is on Eliquis  5 mg bid. Patients  last colonoscopy was several years ago. She has cologuard at home she needs to complete. No significant family history.   Wt Readings from Last 3 Encounters:  08/29/23 188 lb (85.3 kg)  07/21/23 190 lb 9.6 oz (86.5 kg)  06/17/23 182 lb (82.6 kg)      Past Medical History:  Diagnosis Date   ADD (attention deficit disorder)    Allergy    Anemia    Arthritis 07/22/2014   osteoarthritis left knee   Colon polyps    Hypertension    Left knee pain    chronic   Narcotic addiction (HCC)    Obesity    post gastric bypass   Spinal stenosis    Vitamin D  deficiency     Past Surgical History:  Procedure Laterality Date   arthscopic knee surgery Left    several times   ATRIAL FIBRILLATION ABLATION N/A 04/11/2023   Procedure: ATRIAL FIBRILLATION ABLATION;  Surgeon: Leslie Eulas BRAVO, MD;  Location: [name];  Service: Cardiovascular;  Laterality: N/A;   BREAST BIOPSY     BREAST SURGERY  years ago   breast biopsy   CHOLECYSTECTOMY     EYE SURGERY Bilateral 2013   Toric Lens implants   EYE SURGERY Right 2014   corneal amniotic membrane   GASTRIC BYPASS  2003   IR SACROPLASTY BILATERAL  01/07/2023   KIDNEY DONATION  2004   TOTAL KNEE ARTHROPLASTY Left 06/16/2015   Procedure: LEFT TOTAL KNEE ARTHROPLASTY;  Surgeon: Leslie CINDERELLA Poli, MD;  Location: [name];  Service: Orthopedics;  Laterality: Left;   TOTAL KNEE ARTHROPLASTY Right 05/04/2021   Procedure:  RIGHT TOTAL KNEE ARTHROPLASTY;  Surgeon: Leslie Leslie GRADE, MD;  Location: [name];  Service: Orthopedics;  Laterality: Right;    Current Outpatient Medications  Medication Sig Dispense Refill   ALPRAZolam  (XANAX ) 0.25 MG tablet Take 1 tablet (0.25 mg total) by mouth at bedtime as needed for anxiety. 30 tablet 0   amphetamine -dextroamphetamine  (ADDERALL  XR) 30 MG 24 hr capsule Take 1 capsule (30 mg total) by mouth daily. 30 capsule 0   amphetamine -dextroamphetamine  (ADDERALL  XR) 30 MG 24 hr capsule Take 1 capsule  (30 mg total) by mouth daily. 30 capsule 0   amphetamine -dextroamphetamine  (ADDERALL  XR) 30 MG 24 hr capsule Take 1 capsule (30 mg total) by mouth daily. 30 capsule 0   amphetamine -dextroamphetamine  (ADDERALL ) 20 MG tablet Take 1 tablet (20 mg total) by mouth daily. 30 tablet 0   amphetamine -dextroamphetamine  (ADDERALL ) 20 MG tablet Take 1 tablet (20 mg total) by mouth daily. 30 tablet 0   amphetamine -dextroamphetamine  (ADDERALL ) 20 MG tablet Take 1 tablet (20 mg total) by mouth daily. 30 tablet 0   apixaban  (ELIQUIS ) 5 MG TABS tablet Take 1 tablet (5 mg total) by mouth 2 (two) times daily. 180 tablet 3   atorvastatin  (LIPITOR) 40 MG tablet Take 40 mg by mouth daily.     empagliflozin  (JARDIANCE ) 10 MG TABS tablet Take 1 tablet (10 mg total) by mouth daily before breakfast. 90 tablet 3   FLUoxetine  (PROZAC ) 40 MG capsule TAKE 1 CAPSULE(40 MG) BY MOUTH EVERY MORNING 90 capsule 1   furosemide  (LASIX ) 40 MG tablet Take 20 mg by mouth every other day. Take one half (0.5) tablet by mouth ( 20 mg) as needed for wt gain of 3 lbs in 24 hours or 5 lbs in one week.     gabapentin  (NEURONTIN ) 300 MG capsule TAKE 1 CAPSULE BY MOUTH THREE TIMES DAILY (Patient taking differently: Take one  capsule in the morning, one capsule in the afternoon, and two capsules in the evening.) 90 capsule 2   lamoTRIgine  (LAMICTAL ) 100 MG tablet TAKE 1 TABLET(100 MG) BY MOUTH TWICE DAILY (Patient taking differently: Take 100 mg by mouth 2 (two) times daily.) 180 tablet 1   losartan  (COZAAR ) 25 MG tablet Take 25 mg by mouth daily. Patient has only been taking 1/2 tablet daily.     melatonin 3 MG TABS tablet Take 1 tablet (3 mg total) by mouth at bedtime.  0   metoprolol  succinate (TOPROL  XL) 25 MG 24 hr tablet Take 0.5 tablets (12.5 mg total) by mouth daily. 15 tablet 3   Multiple Vitamins-Minerals (PRESERVISION AREDS 2) CAPS Take 1 capsule by mouth 2 (two) times daily.     polyethylene glycol (MIRALAX  / GLYCOLAX ) 17 g packet Take 17  g by mouth daily as needed. 14 each 0   potassium chloride  (KLOR-CON  M) 10 MEQ tablet Take 10 mEq by mouth daily.     senna-docusate (SENOKOT-S) 8.6-50 MG tablet Take 2 tablets by mouth 2 (two) times daily. (Patient taking differently: Take 1 tablet by mouth every other day.)     spironolactone  (ALDACTONE ) 25 MG tablet Take 0.5 tablets (12.5 mg total) by mouth daily. 30 tablet 5   zolpidem  (AMBIEN ) 5 MG tablet Take 1 tablet (5 mg total) by mouth at bedtime as needed for sleep. 30 tablet 2   No current facility-administered medications for this visit.    Allergies as of 08/29/2023 - Review Complete 08/29/2023  Allergen Reaction Noted   Amoxicillin  09/12/2020   Misc. sulfonamide containing compounds  02/24/2023   Nsaids  02/13/2023   Sulfamethoxazole Nausea And Vomiting 12/03/2006   Family History  Problem Relation Age of Onset   Heart disease Mother        post CABG history of CHF   COPD Father    Diabetes Sister    Hypertension Sister        post renal transplant   Heart disease Brother        CAD   Stomach cancer Neg Hx    Pancreatic cancer Neg Hx    Colon cancer Neg Hx    Esophageal cancer Neg Hx    Review of Systems:    Constitutional: No weight loss, fever, chills, weakness or fatigue HEENT: Eyes: No change in vision               Ears, Nose, Throat:  No change in hearing or congestion Skin: No rash or itching Cardiovascular: No chest pain, chest pressure or palpitations   Respiratory: No SOB or cough Gastrointestinal: See HPI and otherwise negative Genitourinary: No dysuria or change in urinary frequency Neurological: No headache, dizziness or syncope Musculoskeletal: No new muscle or joint pain Hematologic: No bleeding or bruising Psychiatric: No history of depression or anxiety   Physical Exam:  Vital signs: BP (!) 100/50 (BP Location: Left Arm, Patient Position: Sitting, Cuff Size: Normal)   Pulse (!) 41   Ht 5' 4 (1.626 m)   Wt 188 lb (85.3 kg)   SpO2 96%    BMI 32.27 kg/m   Constitutional:   Pleasant Caucasian female appears to be in NAD, Well developed, Well nourished, alert and cooperative Eyes:   PEERL, EOMI. No icterus. Conjunctiva pink. Throat: Oral cavity and pharynx without inflammation, swelling or lesion.  Respiratory: Respirations even and unlabored. Lungs clear to auscultation bilaterally.   No wheezes, crackles, or rhonchi.  Cardiovascular: Normal S1, S2. Regular rate and rhythm. No peripheral edema, cyanosis or pallor.  Gastrointestinal:  Soft, nondistended, nontender. No rebound or guarding. Normal bowel sounds. No appreciable masses or hepatomegaly. Rectal:  Not performed.  Msk:  wheelchair bound. ROM in upper/lower extremities. Neurologic:  Alert and  oriented x4;  grossly normal neurologically.  Skin:   Dry and intact without significant lesions or rashes. Psychiatric: Oriented to person, place and time. Demonstrates good judgement and reason without abnormal affect or behaviors.  RELEVANT LABS AND IMAGING: CBC    Latest Ref Rng & Units 04/08/2023   11:10 AM 01/30/2023    5:36 AM 01/29/2023    6:01 AM  CBC  WBC 3.4 - 10.8 x10E3/uL 5.9  5.6  4.8   Hemoglobin 11.1 - 15.9 g/dL 89.1  89.5  9.2   Hematocrit 34.0 - 46.6 % 34.6  33.2  29.2   Platelets 150 - 450 x10E3/uL 331  248  207      CMP     Latest Ref Rng & Units 06/17/2023   10:27 AM 04/08/2023   11:10 AM 01/30/2023    5:36 AM  CMP  Glucose 70 - 99 mg/dL  870  898   BUN 8 - 27 mg/dL  14  15   Creatinine 9.42 - 1.00 mg/dL  8.75  8.95   Sodium 865 - 144 mmol/L  137  132   Potassium 3.5 - 5.2 mmol/L  4.5  3.9   Chloride 96 - 106 mmol/L  101  102   CO2 20 - 29 mmol/L  22  22   Calcium  8.7 - 10.3 mg/dL  9.5  8.7   Total Protein 6.1 - 8.1 g/dL 5.8  5.8  5.8   Total Bilirubin 0.0 - 1.2 mg/dL  0.4  0.4   Alkaline Phos 44 - 121 IU/L  112  215   AST 0 - 40 IU/L  21  25   ALT 0 - 32 IU/L  19  45      [name] Results  Component Value Date   TSH 5.48 (H) 06/17/2023    06/17/2023 labs show: Sed rate 6, CRP less than 3, B12 1440, folate 7.7, vitamin B1 26, copper  30, IgA 266, IgG 684, IgM 67, TSH 5.48, ceruloplasmin 12 01/26/2023 US  ABD RUQ  IMPRESSION: 1. Status post cholecystectomy.  No bile duct dilatation. 2. No acute abnormality.  Assessment: Encounter Diagnoses  Name Primary?   Low ceruloplasmin level Yes   Low serum copper  level        73 year old female patient that presents for evaluation for possible Wilson's disease. Most patients with Tanda disease develop symptoms between the ages of 10 and 42 years. Discussed this case with Dr. Leigh. Per guidelines there is higher suspicion if ceruloplasmin is <14 mg/dL. Patients ceruloplasmin is 12. Low copper  level 30, also seen in bariatric surgery patients. She had bariatric surgery several years ago.  She has normal alk phosp, previously elevated, Normal LFTs, normal PLT and labs most recently show hypochromic microcytic anemia- Hgb 10.8, MCHC 31.2. 7/7/724 US  ABD RUQ normal.  Will recheck ceruloplasmin and hepatic function panel. If the liver enzymes are elevated will do the full liver [name] workup. Will also check 24 hour urine for copper . If the studies are suggestive of Wilson's, patient would need an eye exam and then would refer to Hepatology for specialty care.   Plan: -order 24-hour urinary copper  excretion  -recheck ceruloplasmin, hepatic function panel -Follow-up pending results  Thank you for the courtesy of this consult. Please call me with any questions or concerns.   [name], [name]  Gastroenterology 08/29/2023, 11:37 AM  Cc: [name], [name]*

## 2023-08-28 IMAGING — US US EXTREM LOW VENOUS*R*
1 series · 13 of 24 positions shown · non-contrast
Comparison: None.

CLINICAL DATA: Right lower extremity pain for 4 weeks



[Series 1: us venous img lower uni right (dvt) · portal-venous · 13 of 43 slices shown]
[im 1/43]
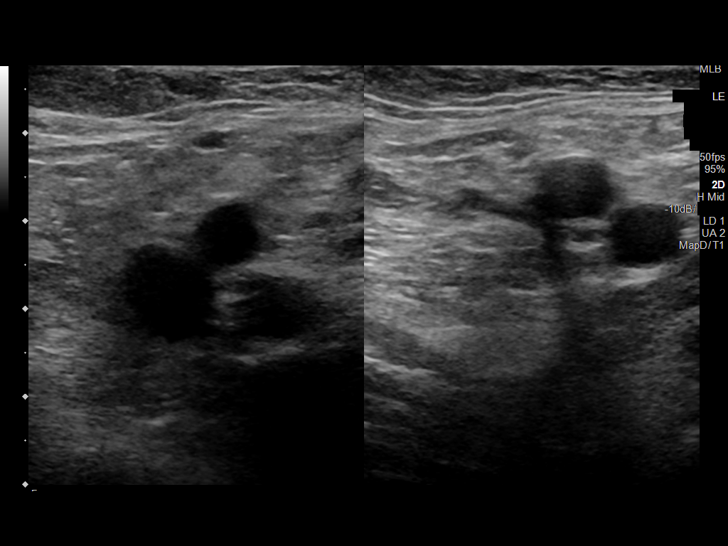
[im 4/43]
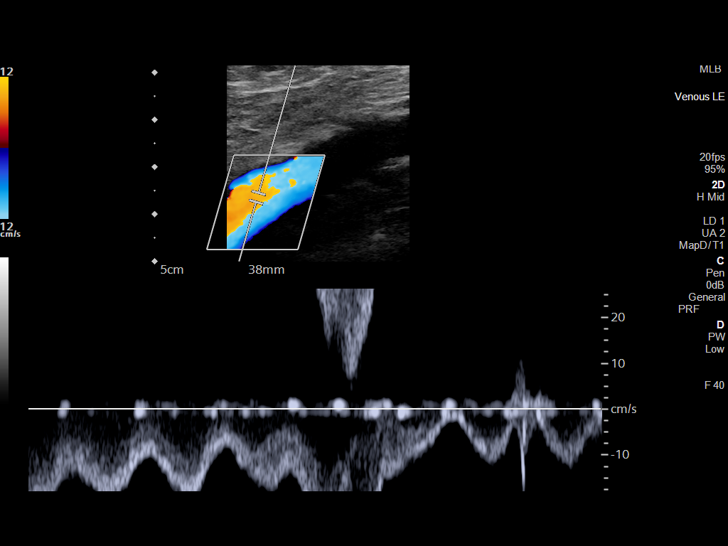
[im 8/43]
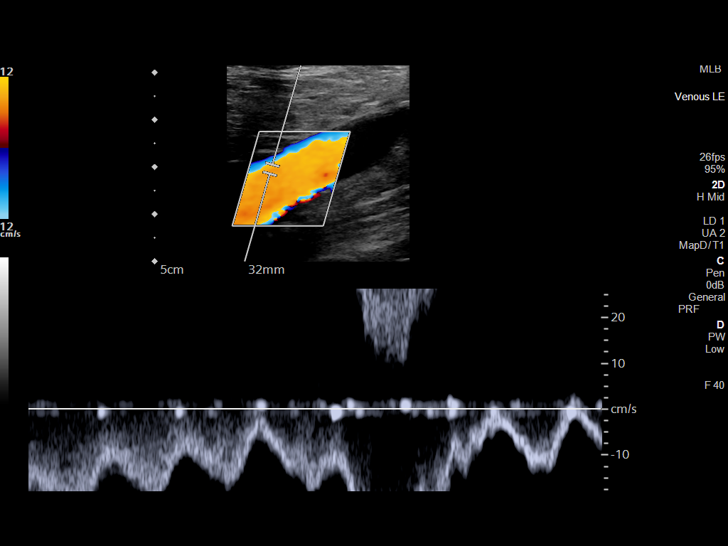
[im 11/43]
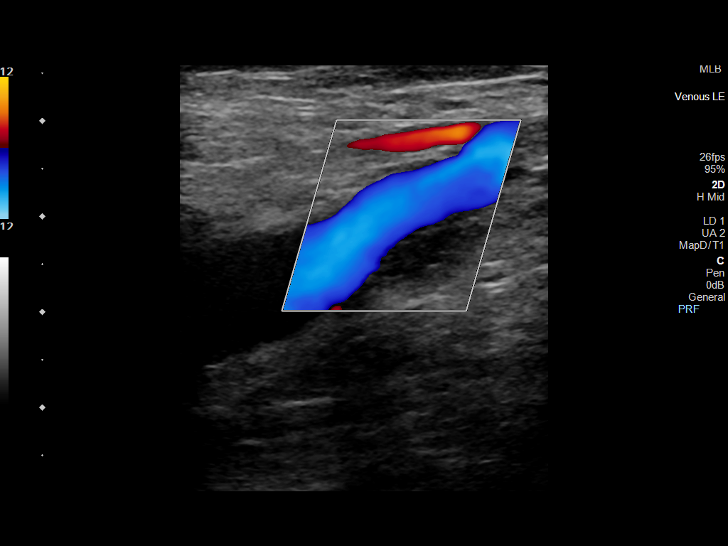
[im 15/43]
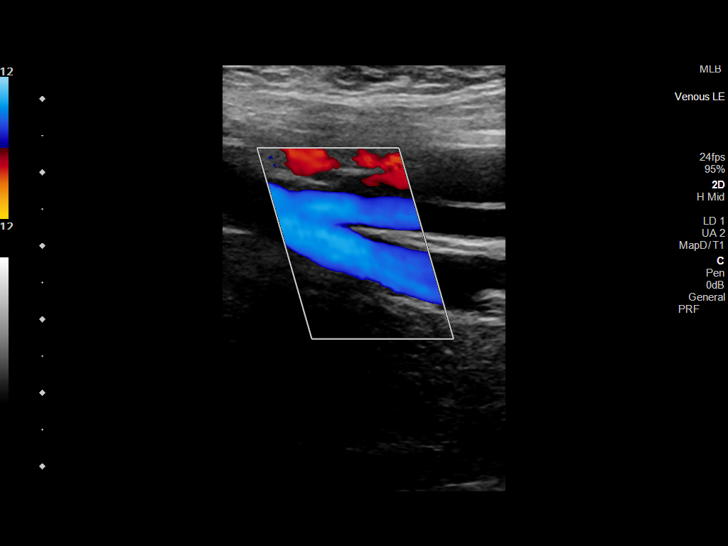
[im 19/43]
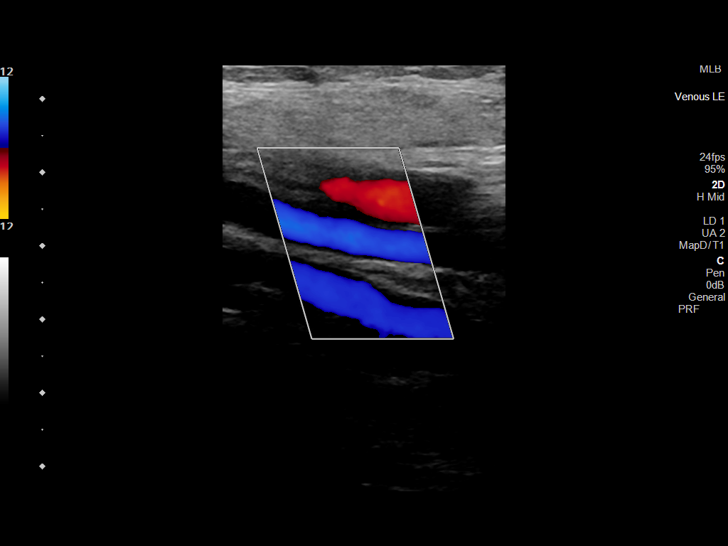
[im 22/43]
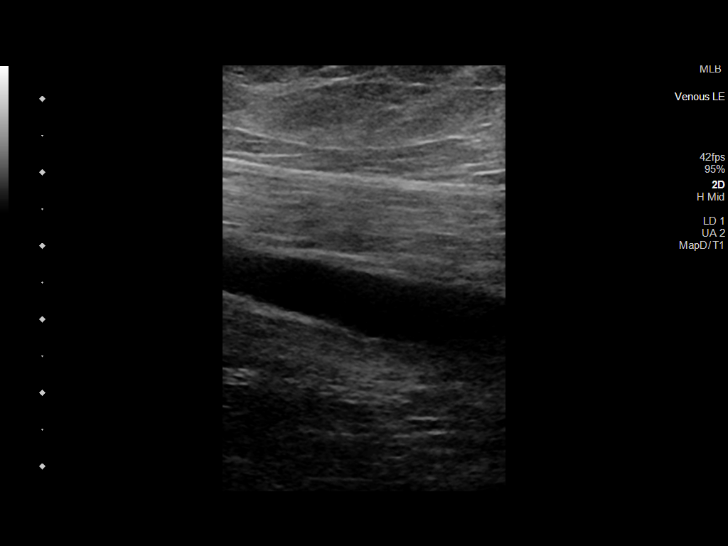
[im 24/43]
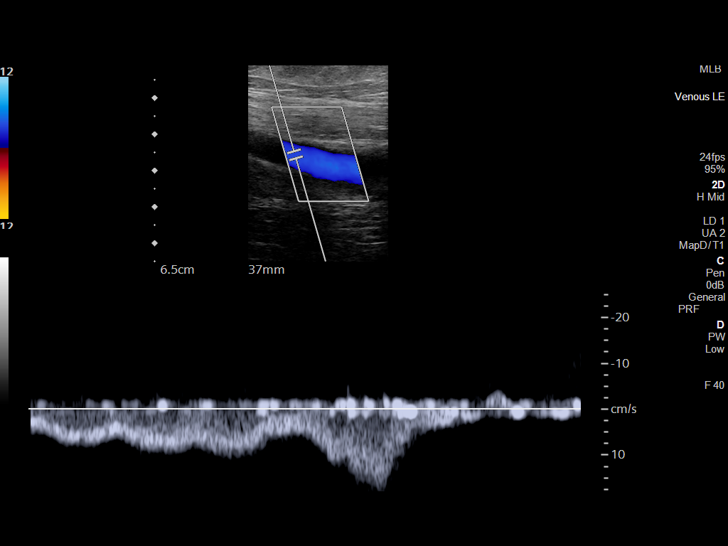
[im 28/43]
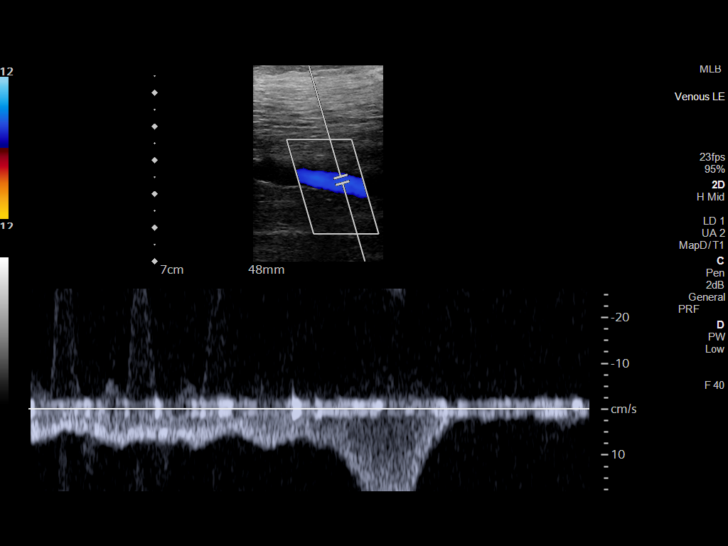
[im 32/43]
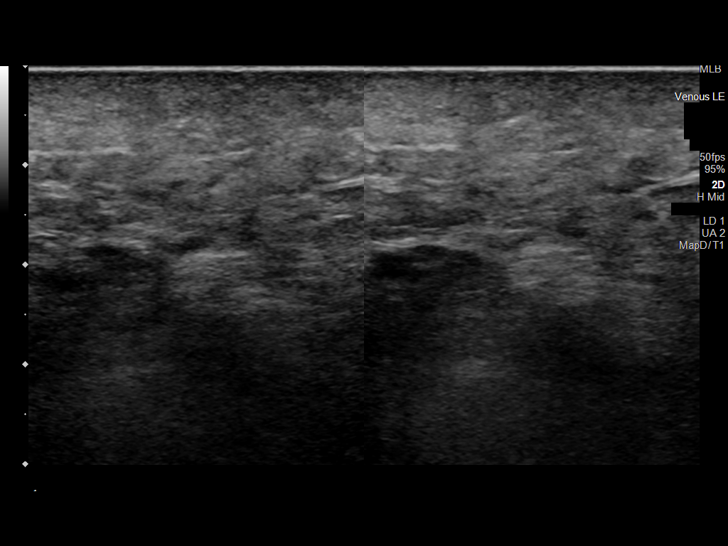
[im 35/43]
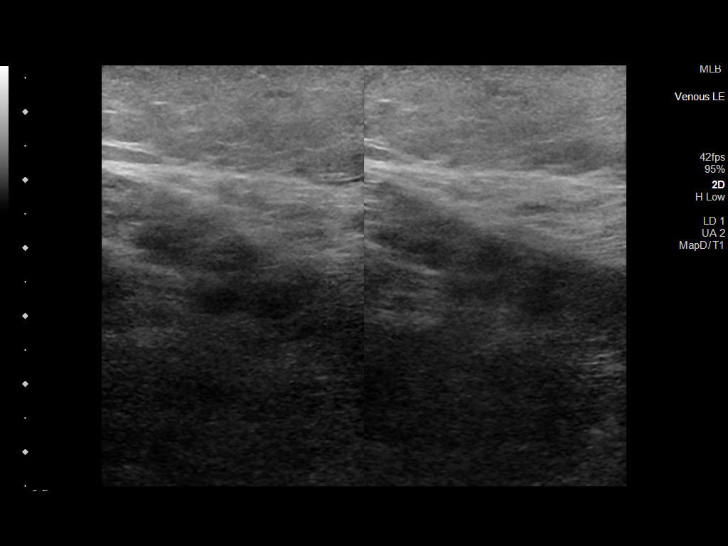
[im 39/43]
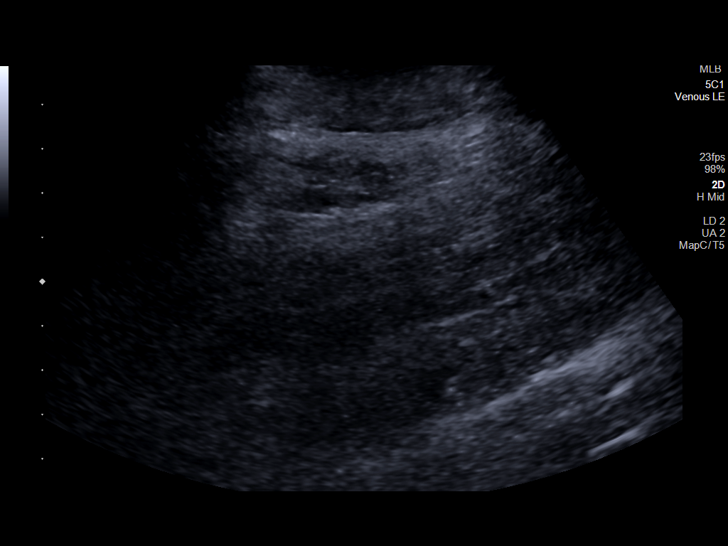
[im 43/43]
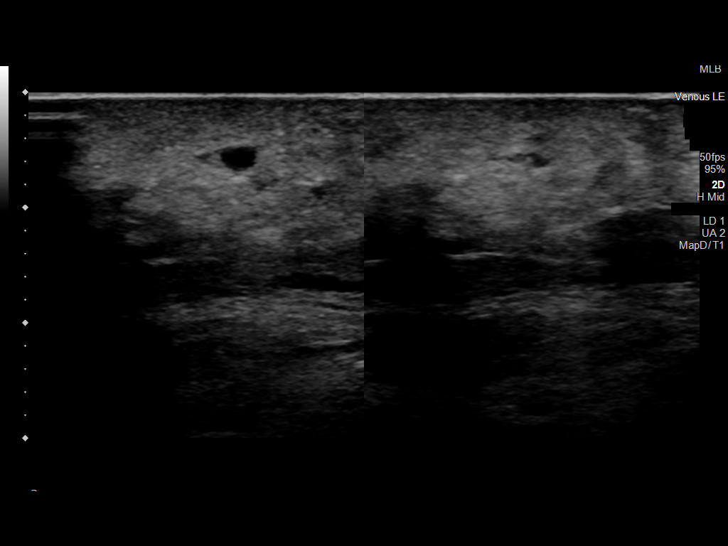

[13 of 24 positions shown; findings below may reference images not displayed]

FINDINGS: Contralateral Common Femoral Vein: Respiratory phasicity is normal
and symmetric with the symptomatic side. No evidence of thrombus.
Normal compressibility.

Common Femoral Vein: No evidence of thrombus. Normal
compressibility, respiratory phasicity and response to augmentation.

Saphenofemoral Junction: No evidence of thrombus. Normal
compressibility and flow on color Doppler imaging.

Profunda Femoral Vein: No evidence of thrombus. Normal
compressibility and flow on color Doppler imaging.

Femoral Vein: No evidence of thrombus. Normal compressibility,
respiratory phasicity and response to augmentation.

Popliteal Vein: No evidence of thrombus. Normal compressibility,
respiratory phasicity and response to augmentation.

Calf Veins: No evidence of thrombus. Normal compressibility and flow
on color Doppler imaging.
IMPRESSION: No evidence of deep venous thrombosis.

## 2023-08-29 ENCOUNTER — Other Ambulatory Visit (INDEPENDENT_AMBULATORY_CARE_PROVIDER_SITE_OTHER): Payer: Medicare PPO

## 2023-08-29 ENCOUNTER — Encounter: Payer: Self-pay | Admitting: Gastroenterology

## 2023-08-29 ENCOUNTER — Ambulatory Visit: Payer: Medicare PPO | Admitting: Gastroenterology

## 2023-08-29 DIAGNOSIS — R790 Abnormal level of blood mineral: Secondary | ICD-10-CM | POA: Diagnosis not present

## 2023-08-29 LAB — HEPATIC FUNCTION PANEL
ALT: 18 U/L (ref 0–35)
AST: 21 U/L (ref 0–37)
Albumin: 4.1 g/dL (ref 3.5–5.2)
Alkaline Phosphatase: 141 U/L — ABNORMAL HIGH (ref 39–117)
Bilirubin, Direct: 0.2 mg/dL (ref 0.0–0.3)
Total Bilirubin: 0.5 mg/dL (ref 0.2–1.2)
Total Protein: 6.7 g/dL (ref 6.0–8.3)

## 2023-08-29 NOTE — Progress Notes (Signed)
 Agree with assessment and plan as outlined.  Would repeat ceruloplasmin to see if this is accurate and repeat LFTs.  Also will check 24-hour urine copper .  I agree that age of onset would be very unusual for new onset Wilsons but will further evaluate to make sure that is not the case.  Can consider eye exam pending her workup if suspicion remains.  If she actually had Wilsons would recommend hepatology consultation.  Need to await her workup first.

## 2023-08-29 NOTE — Patient Instructions (Signed)
 Your provider has requested that you go to the basement level for lab work before leaving today. Press B on the elevator. The lab is located at the first door on the left as you exit the elevator.  We will call you with results and to schedule follow-up.  _______________________________________________________  If your blood pressure at your visit was 140/90 or greater, please contact your primary care physician to follow up on this.  _______________________________________________________  If you are age 95 or older, your body mass index should be between 23-30. Your Body mass index is 32.27 kg/m. If this is out of the aforementioned range listed, please consider follow up with your Primary Care Provider.  If you are age 84 or younger, your body mass index should be between 19-25. Your Body mass index is 32.27 kg/m. If this is out of the aformentioned range listed, please consider follow up with your Primary Care Provider.   ________________________________________________________  The [place] [facility] providers would like to encourage you to use MYCHART to communicate with providers for non-urgent requests or questions.  Due to long hold times on the telephone, sending your provider a message by [facility] may be a faster and more efficient way to get a response.  Please allow 48 business hours for a response.  Please remember that this is for non-urgent requests.  _______________________________________________________  Thank you for trusting me with your gastrointestinal care!   Deanna May, NP

## 2023-09-01 LAB — CERULOPLASMIN: Ceruloplasmin: 12 mg/dL — ABNORMAL LOW (ref 14–48)

## 2023-09-05 ENCOUNTER — Other Ambulatory Visit: Payer: Medicare PPO

## 2023-09-05 DIAGNOSIS — M48062 Spinal stenosis, lumbar region with neurogenic claudication: Secondary | ICD-10-CM | POA: Diagnosis not present

## 2023-09-05 DIAGNOSIS — R790 Abnormal level of blood mineral: Secondary | ICD-10-CM

## 2023-09-07 DIAGNOSIS — X58XXXA Exposure to other specified factors, initial encounter: Secondary | ICD-10-CM | POA: Diagnosis not present

## 2023-09-07 DIAGNOSIS — S41102A Unspecified open wound of left upper arm, initial encounter: Secondary | ICD-10-CM | POA: Diagnosis not present

## 2023-09-08 ENCOUNTER — Telehealth: Payer: Self-pay | Admitting: *Deleted

## 2023-09-08 LAB — COPPER, URINE, 24 HOUR
Copper,Urine (24 Hr): 11 ug/(24.h) — ABNORMAL LOW (ref 15–60)
Total Volume: 750 mL

## 2023-09-08 LAB — EXTRA SPECIMEN

## 2023-09-08 NOTE — Telephone Encounter (Signed)
Please advise holding Eliquis prior to L4-L5, L5-S1 posterior lumbar, decompression  Thank you!  DW

## 2023-09-08 NOTE — Telephone Encounter (Signed)
   Pre-operative Risk Assessment    Patient Name: [name]  DOB: 20-Nov-1950 MRN: [id number]   Date of last office visit: 07/21/23 DR. MEALOR Date of next office visit: NONE   Request for Surgical Clearance    Procedure:   L4-L5, L5-S1 POSTERIOR LUMBAR DECOMPRESSION  Date of Surgery:  Clearance TBD                                Surgeon:  DR. Hoyt Koch Surgeon's Group or Practice Name:  [facility] Phone number:  [phone] EXT [phone] Fax number:  [phone]   Type of Clearance Requested:   - Medical  - Pharmacy:  Hold Apixaban (Eliquis)     Type of Anesthesia:  General    Additional requests/questions:    Elpidio Anis   09/08/2023, 11:06 AM

## 2023-09-10 ENCOUNTER — Other Ambulatory Visit: Payer: Self-pay | Admitting: Internal Medicine

## 2023-09-10 ENCOUNTER — Other Ambulatory Visit (HOSPITAL_COMMUNITY): Payer: Self-pay | Admitting: Cardiology

## 2023-09-10 DIAGNOSIS — F339 Major depressive disorder, recurrent, unspecified: Secondary | ICD-10-CM

## 2023-09-10 NOTE — Telephone Encounter (Signed)
   Name: [name]  DOB: 02/04/1951  MRN: [id number]  Primary Cardiologist: None   Preoperative team, please contact this patient and set up a phone call appointment for further preoperative risk assessment. Please obtain consent and complete medication review. Thank you for your help.  I confirm that guidance regarding antiplatelet and oral anticoagulation therapy has been completed and, if necessary, noted below.  Per Pharm D, patient may hold Eliquis for 3 days prior to procedure.    I also confirmed the patient resides in the state of [place]. As per [facility] Medical Board telemedicine laws, the patient must reside in the state in which the provider is licensed.   Carlos Levering, NP 09/10/2023, 2:16 PM [facility]

## 2023-09-10 NOTE — Telephone Encounter (Signed)
Patient with diagnosis of afib on Eliquis for anticoagulation.    Procedure:  L4-L5, L5-S1 POSTERIOR LUMBAR DECOMPRESSION  Date of procedure: TBD   CHA2DS2-VASc Score = 5   This indicates a 7.2% annual risk of stroke. The patient's score is based upon: CHF History: 1 HTN History: 1 Diabetes History: 0 Stroke History: 0 Vascular Disease History: 1 Age Score: 1 Gender Score: 1      CrCl 43 ml/min Platelet count 331  Per office protocol, patient can hold Eliquis for 3 days prior to procedure.    **This guidance is not considered finalized until pre-operative APP has relayed final recommendations.**

## 2023-09-11 ENCOUNTER — Telehealth: Payer: Self-pay | Admitting: *Deleted

## 2023-09-11 NOTE — Telephone Encounter (Signed)
Pt has been scheduled tele preop appt 09/23/23. Med rec and consent are done.     Patient Consent for Virtual Visit        [name] has provided verbal consent on 09/11/2023 for a virtual visit (video or telephone).   CONSENT FOR VIRTUAL VISIT FOR:  [name]  By participating in this virtual visit I agree to the following:  I hereby voluntarily request, consent and authorize [facility] and its employed or contracted physicians, physician assistants, nurse practitioners or other licensed health care professionals (the Practitioner), to provide me with telemedicine health care services (the "Services") as deemed necessary by the treating Practitioner. I acknowledge and consent to receive the Services by the Practitioner via telemedicine. I understand that the telemedicine visit will involve communicating with the Practitioner through live audiovisual communication technology and the disclosure of certain medical information by electronic transmission. I acknowledge that I have been given the opportunity to request an in-person assessment or other available alternative prior to the telemedicine visit and am voluntarily participating in the telemedicine visit.  I understand that I have the right to withhold or withdraw my consent to the use of telemedicine in the course of my care at any time, without affecting my right to future care or treatment, and that the Practitioner or I may terminate the telemedicine visit at any time. I understand that I have the right to inspect all information obtained and/or recorded in the course of the telemedicine visit and may receive copies of available information for a reasonable fee.  I understand that some of the potential risks of receiving the Services via telemedicine include:  Delay or interruption in medical evaluation due to technological equipment failure or disruption; Information transmitted may not be sufficient (e.g. poor  resolution of images) to allow for appropriate medical decision making by the Practitioner; and/or  In rare instances, security protocols could fail, causing a breach of personal health information.  Furthermore, I acknowledge that it is my responsibility to provide information about my medical history, conditions and care that is complete and accurate to the best of my ability. I acknowledge that Practitioner's advice, recommendations, and/or decision may be based on factors not within their control, such as incomplete or inaccurate data provided by me or distortions of diagnostic images or specimens that may result from electronic transmissions. I understand that the practice of medicine is not an exact science and that Practitioner makes no warranties or guarantees regarding treatment outcomes. I acknowledge that a copy of this consent can be made available to me via my patient portal [facility] MyChart), or I can request a printed copy by calling the office of  [facility].    I understand that my insurance will be billed for this visit.   I have read or had this consent read to me. I understand the contents of this consent, which adequately explains the benefits and risks of the Services being provided via telemedicine.  I have been provided ample opportunity to ask questions regarding this consent and the Services and have had my questions answered to my satisfaction. I give my informed consent for the services to be provided through the use of telemedicine in my medical care

## 2023-09-13 ENCOUNTER — Other Ambulatory Visit: Payer: Self-pay | Admitting: Internal Medicine

## 2023-09-15 ENCOUNTER — Other Ambulatory Visit: Payer: Self-pay

## 2023-09-15 DIAGNOSIS — R262 Difficulty in walking, not elsewhere classified: Secondary | ICD-10-CM | POA: Diagnosis not present

## 2023-09-15 DIAGNOSIS — D649 Anemia, unspecified: Secondary | ICD-10-CM

## 2023-09-23 ENCOUNTER — Ambulatory Visit: Payer: Medicare PPO | Attending: Cardiovascular Disease | Admitting: Nurse Practitioner

## 2023-09-23 DIAGNOSIS — Z01810 Encounter for preprocedural cardiovascular examination: Secondary | ICD-10-CM | POA: Diagnosis not present

## 2023-09-23 NOTE — Progress Notes (Signed)
 Virtual Visit via Telephone Note   Because of MARSHE SHRESTHA co-morbid illnesses, she is at least at moderate risk for complications without adequate follow up.  This format is felt to be most appropriate for this patient at this time.  Due to technical limitations with video connection (technology), today's appointment will be conducted as an audio only telehealth visit, and SABRINA KEOUGH verbally agreed to proceed in this manner.   All issues noted in this document were discussed and addressed.  No physical exam could be performed with this format.  Evaluation Performed:  Preoperative cardiovascular risk assessment _____________   Date:  09/23/2023   Patient ID:  CHARLIEGH VASUDEVAN, DOB May 14, 1951, MRN [id number] Patient Location:  Home Provider location:   Office  Primary Care Provider:  Philip Aspen, Limmie Patricia, MD Primary Cardiologist:  Dietrich Pates, MD  Chief Complaint / Patient Profile   73 y.o. y/o female with a h/o chronic systolic heart failure, nonobstructive CAD, paroxysmal atrial fibrillation, PVCs, obesity s/p gastric bypass, peripheral neuropathy, and nephrectomy who is pending  L4-L5, L5-S1 POSTERIOR LUMBAR DECOMPRESSION with Dr. Hoyt Koch of [facility] and presents today for telephonic preoperative cardiovascular risk assessment.  History of Present Illness    VERGENE MARLAND is a 73 y.o. female who presents via audio/video conferencing for a telehealth visit today.  Pt was last seen in cardiology clinic on 07/21/2023 by Dr. Nelly Laurence.  At that time NIKITHA MODE was doing well.  The patient is now pending procedure as outlined above. Since her last visit, she has done well from a cardiac standpoint. She is currently in a wheelchair and is unable to walk due to severe back pain.  Prior to this, she was able to perform all activities independently.  She denies chest pain, palpitations, dyspnea, pnd, orthopnea, n, v, dizziness, syncope,  edema, weight gain, or early satiety. All other systems reviewed and are otherwise negative except as noted above.   Past Medical History    Past Medical History:  Diagnosis Date   ADD (attention deficit disorder)    Allergy    Anemia    Arthritis 07/22/2014   osteoarthritis left knee   Colon polyps    Hypertension    Left knee pain    chronic   Narcotic addiction (HCC)    Obesity    post gastric bypass   Spinal stenosis    Vitamin D deficiency    Past Surgical History:  Procedure Laterality Date   arthscopic knee surgery Left    several times   ATRIAL FIBRILLATION ABLATION N/A 04/11/2023   Procedure: ATRIAL FIBRILLATION ABLATION;  Surgeon: Maurice Small, MD;  Location: [facility];  Service: Cardiovascular;  Laterality: N/A;   BREAST BIOPSY     BREAST SURGERY  years ago   breast biopsy   CHOLECYSTECTOMY     EYE SURGERY Bilateral 2013   Toric Lens implants   EYE SURGERY Right 2014   corneal amniotic membrane   GASTRIC BYPASS  2003   IR SACROPLASTY BILATERAL  01/07/2023   KIDNEY DONATION  2004   TOTAL KNEE ARTHROPLASTY Left 06/16/2015   Procedure: LEFT TOTAL KNEE ARTHROPLASTY;  Surgeon: Kathryne Hitch, MD;  Location: [facility];  Service: Orthopedics;  Laterality: Left;   TOTAL KNEE ARTHROPLASTY Right 05/04/2021   Procedure: RIGHT TOTAL KNEE ARTHROPLASTY;  Surgeon: Kathryne Hitch, MD;  Location: [facility];  Service: Orthopedics;  Laterality: Right;    Allergies  Allergies  Allergen Reactions   Amoxicillin     Hives   Misc. Sulfonamide Containing Compounds    Nsaids     Due Gastric Bypass   Sulfamethoxazole Nausea And Vomiting    See patient list of med intolerances due to h/o gastric bypass and nephrectomy    Home Medications    Prior to Admission medications   Medication Sig Start Date End Date Taking? Authorizing Provider  ALPRAZolam (XANAX) 0.25 MG tablet Take 1 tablet (0.25 mg total) by mouth at bedtime as needed for anxiety. 08/26/23    Philip Aspen, Limmie Patricia, MD  amphetamine-dextroamphetamine (ADDERALL XR) 30 MG 24 hr capsule Take 1 capsule (30 mg total) by mouth daily. 07/22/23   Henderson Cloud, MD  amphetamine-dextroamphetamine (ADDERALL XR) 30 MG 24 hr capsule Take 1 capsule (30 mg total) by mouth daily. 07/22/23   Henderson Cloud, MD  amphetamine-dextroamphetamine (ADDERALL XR) 30 MG 24 hr capsule Take 1 capsule (30 mg total) by mouth daily. 07/22/23   Philip Aspen, Limmie Patricia, MD  amphetamine-dextroamphetamine (ADDERALL) 20 MG tablet Take 1 tablet (20 mg total) by mouth daily. 07/30/23   Philip Aspen, Limmie Patricia, MD  amphetamine-dextroamphetamine (ADDERALL) 20 MG tablet Take 1 tablet (20 mg total) by mouth daily. 07/30/23   Philip Aspen, Limmie Patricia, MD  amphetamine-dextroamphetamine (ADDERALL) 20 MG tablet Take 1 tablet (20 mg total) by mouth daily. 07/30/23   Philip Aspen, Limmie Patricia, MD  apixaban (ELIQUIS) 5 MG TABS tablet Take 1 tablet (5 mg total) by mouth 2 (two) times daily. 09/09/22   Pricilla Riffle, MD  atorvastatin (LIPITOR) 40 MG tablet Take 40 mg by mouth daily.    [provider]  empagliflozin (JARDIANCE) 10 MG TABS tablet Take 1 tablet (10 mg total) by mouth daily before breakfast. 05/02/23   Sabharwal, Aditya, DO  FLUoxetine (PROZAC) 40 MG capsule TAKE 1 CAPSULE(40 MG) BY MOUTH EVERY MORNING 09/10/23   Philip Aspen, Limmie Patricia, MD  furosemide (LASIX) 40 MG tablet Take 20 mg by mouth every other day. Take one half (0.5) tablet by mouth ( 20 mg) as needed for wt gain of 3 lbs in 24 hours or 5 lbs in one week.    [provider]  gabapentin (NEURONTIN) 300 MG capsule TAKE 1 CAPSULE BY MOUTH THREE TIMES DAILY Patient taking differently: Take one  capsule in the morning, one capsule in the afternoon, and two capsules in the evening. 04/28/23   Philip Aspen, Limmie Patricia, MD  lamoTRIgine (LAMICTAL) 100 MG tablet TAKE 1 TABLET(100 MG) BY MOUTH TWICE DAILY 09/11/23   Philip Aspen, Limmie Patricia, MD  losartan (COZAAR) 25 MG tablet TAKE 1/2 TABLET(12.5 MG) BY MOUTH DAILY 09/10/23   Laurey Morale, MD  melatonin 3 MG TABS tablet Take 1 tablet (3 mg total) by mouth at bedtime. 01/09/23   [place], Alvira Philips, DO  metoprolol succinate (TOPROL XL) 25 MG 24 hr tablet Take 0.5 tablets (12.5 mg total) by mouth daily. 05/14/23 05/13/24  Eustace Pen, PA-C  Multiple Vitamins-Minerals (PRESERVISION AREDS 2) CAPS Take 1 capsule by mouth 2 (two) times daily.    [provider]  polyethylene glycol (MIRALAX / GLYCOLAX) 17 g packet Take 17 g by mouth daily as needed. 01/09/23   [place], Alvira Philips, DO  potassium chloride (KLOR-CON M) 10 MEQ tablet Take 10 mEq by mouth daily. 01/09/23   [provider]  senna-docusate (SENOKOT-S) 8.6-50 MG tablet Take 2 tablets by mouth 2 (two) times  daily. Patient taking differently: Take 1 tablet by mouth every other day. 01/09/23   [place], Alvira Philips, DO  spironolactone (ALDACTONE) 25 MG tablet Take 0.5 tablets (12.5 mg total) by mouth daily. 10/30/22   Robbie Lis M, PA-C  zolpidem (AMBIEN) 5 MG tablet Take 1 tablet (5 mg total) by mouth at bedtime as needed for sleep. 07/22/23   Henderson Cloud, MD    Physical Exam    Vital Signs:  [name] does not have vital signs available for review today.  Given telephonic nature of communication, physical exam is limited. AAOx3. NAD. Normal affect.  Speech and respirations are unlabored.  Accessory Clinical Findings    None  Assessment & Plan    1.  Preoperative Cardiovascular Risk Assessment:  According to the Revised Cardiac Risk Index (RCRI), her Perioperative Risk of Major Cardiac Event is (%): 0.9. Her Functional Capacity in METs is: 3.08 according to the Duke Activity Status Index (DASI). .  She is currently in a wheelchair and is unable to walk due to severe back pain, gait instability.  Prior to this, she was able to perform all activities independently.  Her  functional limitations are not related to her heart. Therefore, based on ACC/AHA guidelines, patient would be at acceptable risk for the planned procedure without further cardiovascular testing.  The patient was advised that if she develops new symptoms prior to surgery to contact our office to arrange for a follow-up visit, and she verbalized understanding.  Per office protocol, patient can hold Eliquis for 3 days prior to procedure.  Please resume Eliquis as soon as possible postprocedure, at the discretion of the surgeon.    A copy of this note will be routed to requesting surgeon.  Time:   Today, I have spent 6 minutes with the patient with telehealth technology discussing medical history, symptoms, and management plan.     Joylene Grapes, NP  09/23/2023, 9:48 AM

## 2023-09-24 ENCOUNTER — Other Ambulatory Visit: Payer: Self-pay | Admitting: Neurosurgery

## 2023-09-25 ENCOUNTER — Other Ambulatory Visit: Payer: Self-pay | Admitting: Neurosurgery

## 2023-10-01 ENCOUNTER — Encounter: Payer: Self-pay | Admitting: Internal Medicine

## 2023-10-03 ENCOUNTER — Encounter (HOSPITAL_COMMUNITY): Payer: Self-pay

## 2023-10-03 NOTE — Progress Notes (Signed)
 Surgical Instructions   Your procedure is scheduled on Tuesday October 14, 2023. Report to [facility] Main Entrance "A" at 5:30 A.M., then check in with the Admitting office. Any questions or running late day of surgery: call [phone]  Questions prior to your surgery date: call [phone], Monday-Friday, 8am-4pm. If you experience any cold or flu symptoms such as cough, fever, chills, shortness of breath, etc. between now and your scheduled surgery, please notify us at the above number.     Remember:  Do not eat or drink after midnight the night before your surgery   Take these medicines the morning of surgery with A SIP OF WATER  atorvastatin (LIPITOR)  FLUoxetine (PROZAC)  gabapentin (NEURONTIN)  lamoTRIgine (LAMICTAL)  metoprolol succinate (TOPROL-XL)   PER YOUR CARDIOLOGIST'S INSTRUCTIONS, PLEASE HOLD YOUR apixaban (ELIQUIS) THREE DAYS PRIOR TO SURGERY WITH THE LAST DOSE BEING 10/10/2023.  PLEASE HOLD YOUR empagliflozin (JARDIANCE) THREE DAYS PRIOR TO SURGERY WITH THE LAST DOSE BEING 10/10/2023.      One week prior to surgery, STOP taking any Aspirin (unless otherwise instructed by your surgeon) Aleve, Naproxen, Ibuprofen, Motrin, Advil, Goody's, BC's, all herbal medications, fish oil, and non-prescription vitamins.                     Do NOT Smoke (Tobacco/Vaping) for 24 hours prior to your procedure.  If you use a CPAP at night, you may bring your mask/headgear for your overnight stay.   You will be asked to remove any contacts, glasses, piercing's, hearing aid's, dentures/partials prior to surgery. Please bring cases for these items if needed.    Patients discharged the day of surgery will not be allowed to drive home, and someone needs to stay with them for 24 hours.  SURGICAL WAITING ROOM VISITATION Patients may have no more than 2 support people in the waiting area - these visitors may rotate.   Pre-op nurse will coordinate an appropriate time for 1 ADULT support  person, who may not rotate, to accompany patient in pre-op.  Children under the age of 9 must have an adult with them who is not the patient and must remain in the main waiting area with an adult.  If the patient needs to stay at the hospital during part of their recovery, the visitor guidelines for inpatient rooms apply.  Please refer to the [facility] website for the visitor guidelines for any additional information.   If you received a COVID test during your pre-op visit  it is requested that you wear a mask when out in public, stay away from anyone that may not be feeling well and notify your surgeon if you develop symptoms. If you have been in contact with anyone that has tested positive in the last 10 days please notify you surgeon.      Pre-operative 5 CHG Bathing Instructions   You can play a key role in reducing the risk of infection after surgery. Your skin needs to be as free of germs as possible. You can reduce the number of germs on your skin by washing with CHG (chlorhexidine gluconate) soap before surgery. CHG is an antiseptic soap that kills germs and continues to kill germs even after washing.   DO NOT use if you have an allergy to chlorhexidine/CHG or antibacterial soaps. If your skin becomes reddened or irritated, stop using the CHG and notify one of our RNs at [phone].   Please shower with the CHG soap starting 4 days before surgery  using the following schedule:     Please keep in mind the following:  DO NOT shave, including legs and underarms, starting the day of your first shower.   You may shave your face at any point before/day of surgery.  Place clean sheets on your bed the day you start using CHG soap. Use a clean washcloth (not used since being washed) for each shower. DO NOT sleep with pets once you start using the CHG.   CHG Shower Instructions:  Wash your face and private area with normal soap. If you choose to wash your hair, wash first with your  normal shampoo.  After you use shampoo/soap, rinse your hair and body thoroughly to remove shampoo/soap residue.  Turn the water OFF and apply about 3 tablespoons (45 ml) of CHG soap to a CLEAN washcloth.  Apply CHG soap ONLY FROM YOUR NECK DOWN TO YOUR TOES (washing for 3-5 minutes)  DO NOT use CHG soap on face, private areas, open wounds, or sores.  Pay special attention to the area where your surgery is being performed.  If you are having back surgery, having someone wash your back for you may be helpful. Wait 2 minutes after CHG soap is applied, then you may rinse off the CHG soap.  Pat dry with a clean towel  Put on clean clothes/pajamas   If you choose to wear lotion, please use ONLY the CHG-compatible lotions that are listed below.  Additional instructions for the day of surgery: DO NOT APPLY any lotions, deodorants or perfumes.   Do not bring valuables to the hospital. [facility] is not responsible for any belongings/valuables. Do not wear nail polish, gel polish, artificial nails, or any other type of covering on natural nails (fingers and toes) Do not wear jewelry or makeup Put on clean/comfortable clothes.  Please brush your teeth.  Ask your nurse before applying any prescription medications to the skin.     CHG Compatible Lotions   Aveeno Moisturizing lotion  Cetaphil Moisturizing Cream  Cetaphil Moisturizing Lotion  Clairol Herbal Essence Moisturizing Lotion, Dry Skin  Clairol Herbal Essence Moisturizing Lotion, Extra Dry Skin  Clairol Herbal Essence Moisturizing Lotion, Normal Skin  Curel Age Defying Therapeutic Moisturizing Lotion with Alpha Hydroxy  Curel Extreme Care Body Lotion  Curel Soothing Hands Moisturizing Hand Lotion  Curel Therapeutic Moisturizing Cream, Fragrance-Free  Curel Therapeutic Moisturizing Lotion, Fragrance-Free  Curel Therapeutic Moisturizing Lotion, Original Formula  Eucerin Daily Replenishing Lotion  Eucerin Dry Skin Therapy Plus Alpha  Hydroxy Crme  Eucerin Dry Skin Therapy Plus Alpha Hydroxy Lotion  Eucerin Original Crme  Eucerin Original Lotion  Eucerin Plus Crme Eucerin Plus Lotion  Eucerin TriLipid Replenishing Lotion  Keri Anti-Bacterial Hand Lotion  Keri Deep Conditioning Original Lotion Dry Skin Formula Softly Scented  Keri Deep Conditioning Original Lotion, Fragrance Free Sensitive Skin Formula  Keri Lotion Fast Absorbing Fragrance Free Sensitive Skin Formula  Keri Lotion Fast Absorbing Softly Scented Dry Skin Formula  Keri Original Lotion  Keri Skin Renewal Lotion Keri Silky Smooth Lotion  Keri Silky Smooth Sensitive Skin Lotion  Nivea Body Creamy Conditioning Oil  Nivea Body Extra Enriched Lotion  Nivea Body Original Lotion  Nivea Body Sheer Moisturizing Lotion Nivea Crme  Nivea Skin Firming Lotion  NutraDerm 30 Skin Lotion  NutraDerm Skin Lotion  NutraDerm Therapeutic Skin Cream  NutraDerm Therapeutic Skin Lotion  ProShield Protective Hand Cream  Provon moisturizing lotion  Please read over the following fact sheets that you were given.

## 2023-10-06 ENCOUNTER — Encounter (HOSPITAL_COMMUNITY)
Admission: RE | Admit: 2023-10-06 | Discharge: 2023-10-06 | Disposition: A | Discharge status: Home / Self Care | Source: Ambulatory Visit | Attending: Neurosurgery | Admitting: Neurosurgery

## 2023-10-06 ENCOUNTER — Other Ambulatory Visit: Payer: Self-pay

## 2023-10-06 ENCOUNTER — Encounter (HOSPITAL_COMMUNITY): Payer: Self-pay

## 2023-10-06 VITALS — BP 114/61 | HR 50 | Temp 97.8°F | Resp 17 | Ht 64.0 in | Wt 189.9 lb

## 2023-10-06 DIAGNOSIS — Z524 Kidney donor: Secondary | ICD-10-CM | POA: Insufficient documentation

## 2023-10-06 DIAGNOSIS — I2510 Atherosclerotic heart disease of native coronary artery without angina pectoris: Secondary | ICD-10-CM | POA: Insufficient documentation

## 2023-10-06 DIAGNOSIS — M48062 Spinal stenosis, lumbar region with neurogenic claudication: Secondary | ICD-10-CM | POA: Insufficient documentation

## 2023-10-06 DIAGNOSIS — Z9884 Bariatric surgery status: Secondary | ICD-10-CM | POA: Insufficient documentation

## 2023-10-06 DIAGNOSIS — Z87891 Personal history of nicotine dependence: Secondary | ICD-10-CM | POA: Diagnosis not present

## 2023-10-06 DIAGNOSIS — N189 Chronic kidney disease, unspecified: Secondary | ICD-10-CM | POA: Insufficient documentation

## 2023-10-06 DIAGNOSIS — Z01818 Encounter for other preprocedural examination: Secondary | ICD-10-CM

## 2023-10-06 DIAGNOSIS — I129 Hypertensive chronic kidney disease with stage 1 through stage 4 chronic kidney disease, or unspecified chronic kidney disease: Secondary | ICD-10-CM | POA: Diagnosis not present

## 2023-10-06 DIAGNOSIS — I4891 Unspecified atrial fibrillation: Secondary | ICD-10-CM | POA: Diagnosis not present

## 2023-10-06 DIAGNOSIS — Z01812 Encounter for preprocedural laboratory examination: Secondary | ICD-10-CM | POA: Insufficient documentation

## 2023-10-06 HISTORY — DX: Cardiac arrhythmia, unspecified: I49.9

## 2023-10-06 HISTORY — DX: Polyneuropathy, unspecified: G62.9

## 2023-10-06 LAB — CBC
HCT: 40.5 % (ref 36.0–46.0)
Hemoglobin: 12.7 g/dL (ref 12.0–15.0)
MCH: 31.1 pg (ref 26.0–34.0)
MCHC: 31.4 g/dL (ref 30.0–36.0)
MCV: 99 fL (ref 80.0–100.0)
Platelets: 218 10*3/uL (ref 150–400)
RBC: 4.09 MIL/uL (ref 3.87–5.11)
RDW: 14.5 % (ref 11.5–15.5)
WBC: 4.8 10*3/uL (ref 4.0–10.5)
nRBC: 0 % (ref 0.0–0.2)

## 2023-10-06 LAB — COMPREHENSIVE METABOLIC PANEL
ALT: 81 U/L — ABNORMAL HIGH (ref 0–44)
AST: 77 U/L — ABNORMAL HIGH (ref 15–41)
Albumin: 3.8 g/dL (ref 3.5–5.0)
Alkaline Phosphatase: 168 U/L — ABNORMAL HIGH (ref 38–126)
Anion gap: 9 (ref 5–15)
BUN: 36 mg/dL — ABNORMAL HIGH (ref 8–23)
CO2: 24 mmol/L (ref 22–32)
Calcium: 9.2 mg/dL (ref 8.9–10.3)
Chloride: 102 mmol/L (ref 98–111)
Creatinine, Ser: 1.48 mg/dL — ABNORMAL HIGH (ref 0.44–1.00)
GFR, Estimated: 37 mL/min — ABNORMAL LOW (ref >60)
Glucose, Bld: 93 mg/dL (ref 70–99)
Potassium: 5 mmol/L (ref 3.5–5.1)
Sodium: 135 mmol/L (ref 135–145)
Total Bilirubin: 0.9 mg/dL (ref 0.0–1.2)
Total Protein: 6.9 g/dL (ref 6.5–8.1)

## 2023-10-06 LAB — SURGICAL PCR SCREEN
MRSA, PCR: NEGATIVE
Staphylococcus aureus: NEGATIVE

## 2023-10-06 NOTE — Progress Notes (Signed)
 PCP - Chaya Jan, MD Cardiologist - Gunnar Fusi Ross,MD EP Cardiologist - Marcella Dubs  PPM/ICD - denies Device Orders -  Rep Notified -   Chest x-ray - na EKG - 07/21/23 Stress Test - na ECHO - na Cardiac Cath - na  Sleep Study - no CPAP -   Fasting Blood Sugar - na Checks Blood Sugar _____ times a day  Last dose of GLP1 agonist-  na GLP1 instructions:   Blood Thinner Instructions: per cardiology-hold Eliquis three days prior to surgery. Last dose will be 10/10/23. Aspirin Instructions:na  ERAS Protcol -no PRE-SURGERY Ensure or G2-   COVID TEST- na   Anesthesia review: hx afib, low heart rate.  Patient denies shortness of breath, fever, cough and chest pain at PAT appointment   All instructions explained to the patient, with a verbal understanding of the material. Patient agrees to go over the instructions while at home for a better understanding. Patient also instructed to wear a mask when out in public prior to surgery. The opportunity to ask questions was provided.

## 2023-10-06 NOTE — Telephone Encounter (Signed)
 Marland Kitchen

## 2023-10-07 ENCOUNTER — Ambulatory Visit (INDEPENDENT_AMBULATORY_CARE_PROVIDER_SITE_OTHER): Payer: Medicare PPO | Admitting: Family Medicine

## 2023-10-07 ENCOUNTER — Encounter (HOSPITAL_COMMUNITY): Payer: Self-pay

## 2023-10-07 DIAGNOSIS — Z0000 Encounter for general adult medical examination without abnormal findings: Secondary | ICD-10-CM | POA: Diagnosis not present

## 2023-10-07 NOTE — Progress Notes (Signed)
 PATIENT CHECK-IN and HEALTH RISK ASSESSMENT QUESTIONNAIRE:  -completed by phone/video for upcoming Medicare Preventive Visit   Pre-Visit Check-in: 1)Vitals (height, wt, BP, etc) - record in vitals section for visit on day of visit Request home vitals (wt, BP, etc.) and enter into vitals, THEN update Vital Signs SmartPhrase below at the top of the HPI. See below.  2)Review and Update Medications, Allergies PMH, Surgeries, Social history in Epic 3)Hospitalizations in the last year with date/reason? Last June fell and had sacral surgery  4)Review and Update Care Team (patient's specialists) in Epic 5) Complete PHQ9 in Epic  6) Complete Fall Screening in Epic 7)Review all Health Maintenance Due and order under PCP if not done.  Medicare Wellness Patient Questionnaire:  Answer theses question about your habits: How often do you have a drink containing alcohol? 1-2 drinks nightly How many drinks containing alcohol do you have on a typical day when you are drinking? 1-2 How often do you have six or more drinks on one occasion? never Have you ever smoked? Quit in 1991 Do you use an illicit drugs?no On average, how many days per week do you engage in moderate to strenuous exercise (like a brisk walk)?does some chair exercises, she is limited due to orthopedic issues and is in a wheelchair, she had physical therapy, thinks does 20 minutes per day. Daughter does the cooking Typical breakfast: bowl of grits, sometime oatmeal or cheese toast Typical lunch: tacos, potatoes Typical dinner: pork, rice and baked beans Typical snacks:not too many snacks, banana  Beverages: banana  Answer theses question about your everyday activities: Can you perform most household chores? No - daughter stays with her and helps Are you deaf or have significant trouble hearing?n Do you feel that you have a problem with memory?n Do you feel safe at home?y Last dentist visit?went recently 8. Do you have any  difficulty performing your everyday activities?y right now, daughter is helping Are you having any difficulty walking, taking medications on your own, and or difficulty managing daily home needs?n Do you have difficulty walking or climbing stairs?y, but she can with a railing and can with help Do you have difficulty dressing or bathing?n Do you have difficulty doing errands alone such as visiting a doctor's office or shopping?y, daughter helps her Do you currently have any difficulty preparing food and eating?daughter does the cooking Do you currently have any difficulty using the toilet? n Do you have any difficulty managing your finances? n Do you have any difficulties with housekeeping of managing your housekeeping? Daughter helps   Do you have Advanced Directives in place (Living Will, Healthcare Power or [place])?  yes   Last eye Exam and location?goes yearly, [facility]   Do you currently use prescribed or non-prescribed narcotic or opioid pain medications?n      ----------------------------------------------------------------------------------------------------------------------------------------------------------------------------------------------------------------------  Because this visit was a virtual/telehealth visit, some criteria may be missing or patient reported. Any vitals not documented were not able to be obtained and vitals that have been documented are patient reported.    MEDICARE ANNUAL PREVENTIVE VISIT WITH PROVIDER: (Welcome to Medicare, initial annual wellness or annual wellness exam)  Virtual Visit via Video Note  I connected with [name] on 10/07/23 by a video enabled telemedicine application and verified that I am speaking with the correct person using two identifiers.  Location patient: home Location provider:work or home office Persons participating in the virtual visit: patient, provider  Concerns and/or follow up today: has bad  neuropathy in  the feet. Seeing Dr. Maisie Fus   See HM section in Epic for other details of completed HM.    ROS: negative for report of fevers, unintentional weight loss, vision changes, vision loss, hearing loss or change, chest pain, sob, hemoptysis, melena, hematochezia, hematuria, falls, bleeding or bruising, thoughts of suicide or self harm, memory loss  Patient-completed extensive health risk assessment - reviewed and discussed with the patient: See Health Risk Assessment completed with patient prior to the visit either above or in recent phone note. This was reviewed in detailed with the patient today and appropriate recommendations, orders and referrals were placed as needed per Summary below and patient instructions.   Review of Medical History: -PMH, PSH, Family History and current specialty and care providers reviewed and updated and listed below   Patient Care Team: Philip Aspen, Limmie Patricia, MD as PCP - General (Internal Medicine) Mealor, Roberts Gaudy, MD as PCP - Electrophysiology (Cardiology) Pricilla Riffle, MD as PCP - Cardiology (Cardiology) Glendale Chard, DO as Consulting Physician (Neurology)   Past Medical History:  Diagnosis Date   ADD (attention deficit disorder)    Allergy    Anemia    Arthritis 07/22/2014   osteoarthritis left knee   Colon polyps    Dysrhythmia    A-fib   Hypertension    Left knee pain    chronic   Narcotic addiction (HCC)    Neuropathy    bilateral feet   Obesity    post gastric bypass   Spinal stenosis    Vitamin D deficiency     Past Surgical History:  Procedure Laterality Date   arthscopic knee surgery Left    several times   ATRIAL FIBRILLATION ABLATION N/A 04/11/2023   Procedure: ATRIAL FIBRILLATION ABLATION;  Surgeon: Maurice Small, MD;  Location: [facility];  Service: Cardiovascular;  Laterality: N/A;   BREAST BIOPSY     BREAST SURGERY  years ago   breast biopsy   CHOLECYSTECTOMY     EYE SURGERY Bilateral  2013   Toric Lens implants   EYE SURGERY Right 2014   corneal amniotic membrane   GASTRIC BYPASS  2003   IR SACROPLASTY BILATERAL  01/07/2023   KIDNEY DONATION  2004   ROTATOR CUFF REPAIR Right    TOTAL KNEE ARTHROPLASTY Left 06/16/2015   Procedure: LEFT TOTAL KNEE ARTHROPLASTY;  Surgeon: Kathryne Hitch, MD;  Location: [facility];  Service: Orthopedics;  Laterality: Left;   TOTAL KNEE ARTHROPLASTY Right 05/04/2021   Procedure: RIGHT TOTAL KNEE ARTHROPLASTY;  Surgeon: Kathryne Hitch, MD;  Location: [facility];  Service: Orthopedics;  Laterality: Right;    Social History   Socioeconomic History   Marital status: Widowed    Spouse name: Not on file   Number of children: 2   Years of education: Not on file   Highest education level: Associate degree: academic program  Occupational History   Occupation: Retired  Tobacco Use   Smoking status: Former    Current packs/day: 0.00    Types: Cigarettes    Quit date: 07/22/1992    Years since quitting: 31.2   Smokeless tobacco: Never  Vaping Use   Vaping status: Never Used  Substance and Sexual Activity   Alcohol use: Yes    Comment: wine daily   Drug use: No   Sexual activity: Not on file  Other Topics Concern   Not on file  Social History Narrative   Are you right handed or left handed? Right  Handed    Are you currently employed ? No    What is your current occupation? Retired   Do you live at home alone? No    Who lives with you? Daughter stays with her    What type of home do you live in: 1 story or 2 story? Lives in a one story home       Social Drivers of Health   Financial Resource Strain: Low Risk  (09/20/2022)   Overall Financial Resource Strain (CARDIA)    Difficulty of Paying Living Expenses: Not very hard  Food Insecurity: No Food Insecurity (01/26/2023)   Hunger Vital Sign    Worried About Running Out of Food in the Last Year: Never true    Ran Out of Food in the Last Year: Never true  Transportation  Needs: No Transportation Needs (01/26/2023)   PRAPARE - Administrator, Civil Service (Medical): No    Lack of Transportation (Non-Medical): No  Physical Activity: Unknown (10/04/2021)   Exercise Vital Sign    Days of Exercise per Week: 0 days    Minutes of Exercise per Session: Not on file  Stress: Not on file  Social Connections: Unknown (09/18/2022)   [facility]   Social Network    Social Network: Not on file  Intimate Partner Violence: Not At Risk (01/26/2023)   Humiliation, Afraid, Rape, and Kick questionnaire    Fear of Current or Ex-Partner: No    Emotionally Abused: No    Physically Abused: No    Sexually Abused: No    Family History  Problem Relation Age of Onset   Heart disease Mother        post CABG history of CHF   COPD Father    Diabetes Sister    Hypertension Sister        post renal transplant   Heart disease Brother        CAD   Stomach cancer Neg Hx    Pancreatic cancer Neg Hx    Colon cancer Neg Hx    Esophageal cancer Neg Hx     Current Outpatient Medications on File Prior to Visit  Medication Sig Dispense Refill   ALPRAZolam (XANAX) 0.25 MG tablet Take 1 tablet (0.25 mg total) by mouth at bedtime as needed for anxiety. 30 tablet 0   amphetamine-dextroamphetamine (ADDERALL XR) 30 MG 24 hr capsule Take 1 capsule (30 mg total) by mouth daily. (Patient not taking: Reported on 10/02/2023) 30 capsule 0   amphetamine-dextroamphetamine (ADDERALL XR) 30 MG 24 hr capsule Take 1 capsule (30 mg total) by mouth daily. 30 capsule 0   amphetamine-dextroamphetamine (ADDERALL XR) 30 MG 24 hr capsule Take 1 capsule (30 mg total) by mouth daily. (Patient not taking: Reported on 10/02/2023) 30 capsule 0   amphetamine-dextroamphetamine (ADDERALL) 20 MG tablet Take 1 tablet (20 mg total) by mouth daily. 30 tablet 0   amphetamine-dextroamphetamine (ADDERALL) 20 MG tablet Take 1 tablet (20 mg total) by mouth daily. 30 tablet 0    amphetamine-dextroamphetamine (ADDERALL) 20 MG tablet Take 1 tablet (20 mg total) by mouth daily. (Patient not taking: Reported on 10/02/2023) 30 tablet 0   apixaban (ELIQUIS) 5 MG TABS tablet Take 1 tablet (5 mg total) by mouth 2 (two) times daily. 180 tablet 3   atorvastatin (LIPITOR) 40 MG tablet Take 40 mg by mouth daily.     Cholecalciferol (VITAMIN D3) 50 MCG (2000 UT) TABS Take 2,000 Units by mouth daily.  empagliflozin (JARDIANCE) 10 MG TABS tablet Take 1 tablet (10 mg total) by mouth daily before breakfast. 90 tablet 3   FLUoxetine (PROZAC) 40 MG capsule TAKE 1 CAPSULE(40 MG) BY MOUTH EVERY MORNING 90 capsule 1   furosemide (LASIX) 40 MG tablet Take 20 mg by mouth daily.     gabapentin (NEURONTIN) 300 MG capsule TAKE 1 CAPSULE BY MOUTH THREE TIMES DAILY (Patient taking differently: Take 300-600 mg by mouth See admin instructions. Take one  capsule in the morning, one capsule in the afternoon, and two capsules in the evening.) 90 capsule 2   lamoTRIgine (LAMICTAL) 100 MG tablet TAKE 1 TABLET(100 MG) BY MOUTH TWICE DAILY 180 tablet 1   losartan (COZAAR) 25 MG tablet TAKE 1/2 TABLET(12.5 MG) BY MOUTH DAILY (Patient taking differently: Take 25 mg by mouth daily.) 45 tablet 3   melatonin 3 MG TABS tablet Take 1 tablet (3 mg total) by mouth at bedtime.  0   metoprolol succinate (TOPROL XL) 25 MG 24 hr tablet Take 0.5 tablets (12.5 mg total) by mouth daily. (Patient not taking: Reported on 10/02/2023) 15 tablet 3   metoprolol succinate (TOPROL-XL) 50 MG 24 hr tablet Take 25 mg by mouth daily.     Multiple Vitamins-Minerals (PRESERVISION AREDS 2) CAPS Take 1 capsule by mouth 2 (two) times daily.     polyethylene glycol (MIRALAX / GLYCOLAX) 17 g packet Take 17 g by mouth daily as needed. (Patient not taking: Reported on 10/02/2023) 14 each 0   potassium chloride (KLOR-CON M) 10 MEQ tablet Take 10 mEq by mouth daily.     senna-docusate (SENOKOT-S) 8.6-50 MG tablet Take 2 tablets by mouth 2 (two) times  daily. (Patient not taking: Reported on 10/02/2023)     spironolactone (ALDACTONE) 25 MG tablet Take 0.5 tablets (12.5 mg total) by mouth daily. 30 tablet 5   zolpidem (AMBIEN) 5 MG tablet Take 1 tablet (5 mg total) by mouth at bedtime as needed for sleep. (Patient taking differently: Take 5 mg by mouth at bedtime.) 30 tablet 2   No current facility-administered medications on file prior to visit.    Allergies  Allergen Reactions   Amoxicillin Hives   Misc. Sulfonamide Containing Compounds    Nsaids     Due Gastric Bypass   Sulfamethoxazole Nausea And Vomiting    See patient list of med intolerances due to h/o gastric bypass and nephrectomy       Physical Exam Vitals requested from patient and listed below if patient had equipment and was able to obtain at home for this virtual visit: There were no vitals filed for this visit. Estimated body mass index is 32.6 kg/m as calculated from the following:   Height as of 10/06/23: 5\' 4"  (1.626 m).   Weight as of 10/06/23: 189 lb 14.4 oz (86.1 kg).  EKG (optional): deferred due to virtual visit  GENERAL: alert, oriented, no acute distress detected, full vision exam deferred due to pandemic and/or virtual encounter  HEENT: atraumatic, conjunttiva clear, no obvious abnormalities on inspection of external nose and ears  NECK: normal movements of the head and neck  LUNGS: on inspection no signs of respiratory distress, breathing rate appears normal, no obvious gross SOB, gasping or wheezing  CV: no obvious cyanosis  MS: moves all visible extremities without noticeable abnormality  PSYCH/NEURO: pleasant and cooperative, no obvious depression or anxiety, speech and thought processing grossly intact, Cognitive function grossly intact  [employer] Visit from [facility]/20/[facility] in [facility]  at [facility]  PHQ-9 Total Score 17           10/07/2023    3:45 PM 07/22/2023   11:31 AM 03/20/2023   11:39 AM 02/13/2023     4:00 PM 06/17/2022    1:31 PM  Depression screen PHQ 2/9  Decreased Interest 0 0 1 1 1   Down, Depressed, Hopeless 1 0 1 1 1   PHQ - 2 Score 1 0 2 2 2   Altered sleeping   3 3 1   Tired, decreased energy   3 3 1   Change in appetite   3 3 0  Feeling bad or failure about yourself    3 3 0  Trouble concentrating   0 0 0  Moving slowly or fidgety/restless   1 1 0  Suicidal thoughts   2 2 0  PHQ-9 Score   17 17 4   Difficult doing work/chores    Extremely dIfficult Not difficult at all  Does not feel is suffering from depression so much now but has her whole life and occasionally.     02/13/2023    2:38 PM 03/20/2023   11:39 AM 06/17/2023    9:08 AM 07/22/2023   11:31 AM 10/07/2023    3:44 PM  Fall Risk  Falls in the past year? 1 1 1 1 1   Was there an injury with Fall? 1 1 1  0 0  Fall Risk Category Calculator 2 2 3 1 1   Fall risk Follow up Falls evaluation completed Falls evaluation completed Falls evaluation completed Falls evaluation completed Falls evaluation completed;Education provided     SUMMARY AND PLAN:  Encounter for Medicare annual wellness exam   Discussed applicable health maintenance/preventive health measures and advised and referred or ordered per patient preferences: -has cologuard kit at home - just got it a month ago, advised to complete -she is getting ready to possibly have back surgery and agrees to call after to get mammogram as is currently in wheelchair, advised to call and let us know as soon as able so we know how to schedule for her -advised of bone density, she also wants to wait of this -advised on covid vaccine and that she can get at the pharmacy if decides to do  Health Maintenance  Topic Date Due   DEXA SCAN  Never done   MAMMOGRAM  10/31/2020   COVID-19 Vaccine (5 - 2024-25 season) 03/23/2023   INFLUENZA VACCINE  10/20/2023 (Originally 02/20/2023)   Fecal DNA (Cologuard)  07/21/2024 (Originally 07/02/1995)   Medicare Annual Wellness (AWV)   10/06/2024   DTaP/Tdap/Td (3 - Td or Tdap) 11/10/2030   Pneumonia Vaccine 38+ Years old  Completed   Hepatitis C Screening  Completed   Zoster Vaccines- Shingrix  Completed   HPV VACCINES  Aged Out   Colonoscopy  Discontinued      Education and counseling on the following was provided based on the above review of health and a plan/checklist for the patient, along with additional information discussed, was provided for the patient in the patient instructions :  -Provided counseling and plan for increased risk of falling if applicable per above screening, she has had PT, is doing exercises provided and is using chair, has others around when up and around and is using caution. -Advised and counseled on a healthy lifestyle - including the importance of a healthy diet, regular physical activity, social connections and stress management. -she declined counseling for now, included number in pt instructions in case she needs -Reviewed  patient's current diet. Advised and counseled on a whole foods based healthy diet. A summary of a healthy diet was provided in the Patient Instructions.  -reviewed patient's current physical activity level and discussed exercise guidelines for adults. Discussed community resources and ideas for safe exercise at home to assist in meeting exercise guideline - congratulated on current exercise and discussed more chair exercises -Advise yearly dental visits at minimum and regular eye exams -Advised and counseled on alcohol safe limits, risks Follow up: see patient instructions     Patient Instructions  I really enjoyed getting to talk with you today! I am available on Tuesdays and Thursdays for virtual visits if you have any questions or concerns, or if I can be of any further assistance.   CHECKLIST FROM ANNUAL WELLNESS VISIT:  -Follow up (please call to schedule if not scheduled after visit):   -yearly for annual wellness visit with primary care office  Here is a  list of your preventive care/health maintenance measures and the plan for each if any are due:  PLAN For any measures below that may be due:  -please complete the cologuard test -call when you wish to do the mammogram and bone density test -can get the vaccines at the pharmacy - if you do, please let us know so that we can update your record  Health Maintenance  Topic Date Due   DEXA SCAN  Never done   MAMMOGRAM  10/31/2020   COVID-19 Vaccine (5 - 2024-25 season) 03/23/2023   INFLUENZA VACCINE  10/20/2023 (Originally 02/20/2023)   Fecal DNA (Cologuard)  07/21/2024 (Originally 07/02/1995)   Medicare Annual Wellness (AWV)  10/06/2024   DTaP/Tdap/Td (3 - Td or Tdap) 11/10/2030   Pneumonia Vaccine 67+ Years old  Completed   Hepatitis C Screening  Completed   Zoster Vaccines- Shingrix  Completed   HPV VACCINES  Aged Out   Colonoscopy  Discontinued    -See a dentist at least yearly  -Get your eyes checked and then per your eye specialist's recommendations  -Other issues addressed today:   Alcohol: please cut back on alcohol to no more than 1 drink per any given 24 hour period   -I have included below further information regarding a healthy whole foods based diet, physical activity guidelines for adults, stress management and opportunities for social connections. I hope you find this information useful.   -----------------------------------------------------------------------------------------------------------------------------------------------------------------------------------------------------------------------------------------------------------    NUTRITION: -eat real food: lots of colorful vegetables (half the plate) and fruits -5-7 servings of vegetables and fruits per day (fresh or steamed is best), exp. 2 servings of vegetables with lunch and dinner and 2 servings of fruit per day. Berries and greens such as kale and collards are great choices.  -consume on a regular  basis:  fresh fruits, fresh veggies, fish, nuts, seeds, healthy oils (such as olive oil, avocado oil), whole grains (make sure for bread/pasta/crackers/etc., that the first ingredient on label contains the word "whole"), legumes. -can eat small amounts of dairy and lean meat (no larger than the palm of your hand), but avoid processed meats such as ham, bacon, lunch meat, etc. -drink water -try to avoid fast food and pre-packaged foods, processed meat, ultra processed foods/beverages (donuts, candy, etc.) -most experts advise limiting sodium to < 2300mg  per day, should limit further is any chronic conditions such as high blood pressure, heart disease, diabetes, etc. The American Heart Association advised that < 1500mg  is is ideal -try to avoid foods/beverages that contain any ingredients with names you  do not recognize  -try to avoid foods/beverages  with added sugar or sweeteners/sweets  -try to avoid sweet drinks (including diet drinks): soda, juice, Gatorade, sweet tea, power drinks, diet drinks -try to avoid white rice, white bread, pasta (unless whole grain)  EXERCISE GUIDELINES FOR ADULTS: -if you wish to increase your physical activity, do so gradually and with the approval of your doctor -STOP and seek medical care immediately if you have any chest pain, chest discomfort or trouble breathing when starting or increasing exercise  -move and stretch your body, legs, feet and arms when sitting for long periods -Physical activity guidelines for optimal health in adults: -get at least 150 minutes per week of moderate exercise (can talk, but not sing); this is about 20-30 minutes of sustained activity 5-7 days per week or two 10-15 minute episodes of sustained activity 5-7 days per week -do some muscle building/resistance training/strength training at least 2 days per week  -balance exercises 3+ days per week:    If you need ideas or help with getting more active:  -Silver  sneakers https://tools.silversneakers.com  -try to include resistance (weight lifting/strength building) and balance exercises twice per week: or the following link for ideas: http://castillo-powell.com/  BuyDucts.dk  STRESS MANAGEMENT: -can try meditating, or just sitting quietly with deep breathing while intentionally relaxing all parts of your body for 5 minutes daily -if you need further help with stress, anxiety or depression please follow up with your primary doctor or contact the wonderful folks at [facility]: [phone]  SOCIAL CONNECTIONS: -options in [place] if you wish to engage in more social and exercise related activities:  -Silver sneakers https://tools.silversneakers.com  -Check out the [facility] Active Adults 50+ section on the [place] [employer] (hiking clubs, book clubs, cards and games, chess, exercise classes, aquatic classes and much more) - see the website for details: https://www.[place].gov/departments/parks-recreation/active-adults50  -YouTube has lots of exercise videos for different ages and abilities as well  -[facility] (a variety of indoor and outdoor inperson activities for adults). (714)472-0053. [address].  -Virtual Online Classes (a variety of topics): see seniorplanet.org or call ([phone]  -consider volunteering at a school, hospice center, church, senior center or elsewhere            Terressa Koyanagi, DO

## 2023-10-07 NOTE — Patient Instructions (Signed)
 I really enjoyed getting to talk with you today! I am available on Tuesdays and Thursdays for virtual visits if you have any questions or concerns, or if I can be of any further assistance.   CHECKLIST FROM ANNUAL WELLNESS VISIT:  -Follow up (please call to schedule if not scheduled after visit):   -yearly for annual wellness visit with primary care office  Here is a list of your preventive care/health maintenance measures and the plan for each if any are due:  PLAN For any measures below that may be due:  -please complete the cologuard test -call when you wish to do the mammogram and bone density test -can get the vaccines at the pharmacy - if you do, please let us know so that we can update your record  Health Maintenance  Topic Date Due   DEXA SCAN  Never done   MAMMOGRAM  10/31/2020   COVID-19 Vaccine (5 - 2024-25 season) 03/23/2023   INFLUENZA VACCINE  10/20/2023 (Originally 02/20/2023)   Fecal DNA (Cologuard)  07/21/2024 (Originally 07/02/1995)   Medicare Annual Wellness (AWV)  10/06/2024   DTaP/Tdap/Td (3 - Td or Tdap) 11/10/2030   Pneumonia Vaccine 13+ Years old  Completed   Hepatitis C Screening  Completed   Zoster Vaccines- Shingrix  Completed   HPV VACCINES  Aged Out   Colonoscopy  Discontinued    -See a dentist at least yearly  -Get your eyes checked and then per your eye specialist's recommendations  -Other issues addressed today:   Alcohol: please cut back on alcohol to no more than 1 drink per any given 24 hour period   -I have included below further information regarding a healthy whole foods based diet, physical activity guidelines for adults, stress management and opportunities for social connections. I hope you find this information useful.    -----------------------------------------------------------------------------------------------------------------------------------------------------------------------------------------------------------------------------------------------------------    NUTRITION: -eat real food: lots of colorful vegetables (half the plate) and fruits -5-7 servings of vegetables and fruits per day (fresh or steamed is best), exp. 2 servings of vegetables with lunch and dinner and 2 servings of fruit per day. Berries and greens such as kale and collards are great choices.  -consume on a regular basis:  fresh fruits, fresh veggies, fish, nuts, seeds, healthy oils (such as olive oil, avocado oil), whole grains (make sure for bread/pasta/crackers/etc., that the first ingredient on label contains the word "whole"), legumes. -can eat small amounts of dairy and lean meat (no larger than the palm of your hand), but avoid processed meats such as ham, bacon, lunch meat, etc. -drink water -try to avoid fast food and pre-packaged foods, processed meat, ultra processed foods/beverages (donuts, candy, etc.) -most experts advise limiting sodium to < 2300mg  per day, should limit further is any chronic conditions such as high blood pressure, heart disease, diabetes, etc. The American Heart Association advised that < 1500mg  is is ideal -try to avoid foods/beverages that contain any ingredients with names you do not recognize  -try to avoid foods/beverages  with added sugar or sweeteners/sweets  -try to avoid sweet drinks (including diet drinks): soda, juice, Gatorade, sweet tea, power drinks, diet drinks -try to avoid white rice, white bread, pasta (unless whole grain)  EXERCISE GUIDELINES FOR ADULTS: -if you wish to increase your physical activity, do so gradually and with the approval of your doctor -STOP and seek medical care immediately if you have any chest pain, chest discomfort or trouble breathing when starting or  increasing exercise  -move and stretch your body,  legs, feet and arms when sitting for long periods -Physical activity guidelines for optimal health in adults: -get at least 150 minutes per week of moderate exercise (can talk, but not sing); this is about 20-30 minutes of sustained activity 5-7 days per week or two 10-15 minute episodes of sustained activity 5-7 days per week -do some muscle building/resistance training/strength training at least 2 days per week  -balance exercises 3+ days per week:    If you need ideas or help with getting more active:  -Silver sneakers https://tools.silversneakers.com  -try to include resistance (weight lifting/strength building) and balance exercises twice per week: or the following link for ideas: http://castillo-powell.com/  BuyDucts.dk  STRESS MANAGEMENT: -can try meditating, or just sitting quietly with deep breathing while intentionally relaxing all parts of your body for 5 minutes daily -if you need further help with stress, anxiety or depression please follow up with your primary doctor or contact the wonderful folks at [facility]: [phone]  SOCIAL CONNECTIONS: -options in [place] if you wish to engage in more social and exercise related activities:  -Silver sneakers https://tools.silversneakers.com  -Check out the [facility] Active Adults 50+ section on the [place] of [employer] (hiking clubs, book clubs, cards and games, chess, exercise classes, aquatic classes and much more) - see the website for details: https://www.[place].gov/departments/parks-recreation/active-adults50  -YouTube has lots of exercise videos for different ages and abilities as well  -[facility] (a variety of indoor and outdoor inperson activities for adults). 7601706139. [address]  -Virtual Online Classes (a variety  of topics): see seniorplanet.org or call [phone]  -consider volunteering at a school, hospice center, church, senior center or elsewhere

## 2023-10-07 NOTE — Anesthesia Preprocedure Evaluation (Addendum)
 Anesthesia Evaluation  Patient identified by MRN, date of birth, ID band Patient awake    Reviewed: Allergy & Precautions, H&P , NPO status , Patient's Chart, lab work & pertinent test results  Airway Mallampati: II   Neck ROM: full    Dental   Pulmonary former smoker   breath sounds clear to auscultation       Cardiovascular hypertension, + dysrhythmias Atrial Fibrillation  Rhythm:regular Rate:Normal     Neuro/Psych    GI/Hepatic   Endo/Other    Renal/GU Renal diseaseCr 1.48     Musculoskeletal  (+) Arthritis ,    Abdominal   Peds  Hematology   Anesthesia Other Findings   Reproductive/Obstetrics                             Anesthesia Physical Anesthesia Plan  ASA: 3  Anesthesia Plan: General   Post-op Pain Management:    Induction: Intravenous  PONV Risk Score and Plan: 3 and Ondansetron, Dexamethasone and Treatment may vary due to age or medical condition  Airway Management Planned: Oral ETT  Additional Equipment:   Intra-op Plan:   Post-operative Plan: Extubation in OR  Informed Consent: I have reviewed the patients History and Physical, chart, labs and discussed the procedure including the risks, benefits and alternatives for the proposed anesthesia with the patient or authorized representative who has indicated his/her understanding and acceptance.     Dental advisory given  Plan Discussed with: CRNA, Anesthesiologist and Surgeon  Anesthesia Plan Comments: (See PAT note from 3/17 by Sherlie Ban PA-C )        Anesthesia Quick Evaluation

## 2023-10-07 NOTE — Progress Notes (Signed)
 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  She is been nonambulatory since then and has chronic pain.  Patient was evaluated by cardiology in January 2024 for progressive shortness of breath.  Echo at that time showed EF of 25 to 30%.  She was diuresed with Lasix and plan was for a right and left heart cath however due to an AKI this was postponed.  Cardiac monitoring was remarkable for A-fib.  She was started on Eliquis and she underwent an ablation on 04/11/2023.  Her echo was repeated on 05/29/2023 and showed normalization of her EF. Had pre op evaluation on 09/23/23. Doing well from cardiac standpoint, tolerating GDMT and Eliquis. Cleared for surgery:  "According to the Revised Cardiac Risk Index (RCRI), her Perioperative Risk of Major Cardiac Event is (%): 0.9. Her Functional Capacity in METs is: 3.08 according to the Duke Activity Status Index (DASI). .  She is currently in a wheelchair and is unable to walk due to severe back pain, gait instability.  Prior to this, she was able to perform all activities independently.  Her functional limitations are not related to her heart. Therefore, based on ACC/AHA guidelines, patient would be at acceptable risk for the planned procedure without  further cardiovascular testing"  VS: BP 114/61   Pulse (!) 50   Temp 36.6 C   Resp 17   Ht 5\' 4"  (1.626 m)   Wt 86.1 kg   SpO2 100%   BMI 32.60 kg/m   PROVIDERS: Philip Aspen, Limmie Patricia, MD   LABS: Labs reviewed: Acceptable for surgery. Kidney function mildly worse (prior SCr 1.2) (all labs ordered are listed, but only abnormal results are displayed)  Labs Reviewed  COMPREHENSIVE METABOLIC PANEL - Abnormal; Notable for the following components:      Result Value   BUN 36 (*)    Creatinine, Ser 1.48 (*)    AST 77 (*)    ALT 81 (*)    Alkaline Phosphatase 168 (*)    GFR, Estimated 37 (*)    All other components within normal limits  SURGICAL PCR SCREEN  CBC     IMAGES:  CXR 06/12/23:   FINDINGS: No evidence of acute right rib fracture. There are multiple remote healed right rib fractures are unchanged from prior exam. There is no evidence of pneumothorax or pleural effusion. Chronic elevation of right hemidiaphragm. Heart size and mediastinal contours are within normal limits. Aortic atherosclerosis.   IMPRESSION: No evidence of acute right rib fracture.  No pulmonary complication.   Multiple remote healed right rib fractures.  EKG 07/21/23:  Marked sinus bradycardia, rate 42 Nonspecific ST abnormality  CV:  Echo 05/29/2023:  IMPRESSIONS    1. Left ventricular ejection fraction, by estimation, is 55 to 60%. The  left ventricle has normal function. The left ventricle has no regional wall motion abnormalities. Left ventricular diastolic parameters are indeterminate.  2. Right ventricular systolic function is low normal. The right ventricular size is normal. There is normal pulmonary artery systolic pressure.  3. Left atrial size was mildly dilated.  4. The mitral valve is degenerative. Mild mitral valve regurgitation. No evidence of mitral stenosis.  5. The aortic valve is tricuspid. Aortic valve regurgitation is mild. Aortic valve sclerosis is  present, with no evidence of aortic valve stenosis.  6. The inferior vena cava is normal in size with greater than 50% respiratory variability, suggesting right atrial pressure of 3 mmHg.  7. Evidence of atrial level shunting detected by color flow Doppler.  Comparison(s): A prior study was performed on 12/19/2022. LVEF improved from 45% to 55-60%, Grade 1 diastolic dysfunction is now indeterminate, otherwise no significant change. Atrial level shunting noted on current study.  Coronary CTA 10/07/2022:  IMPRESSION: 1. Calcium score 153 which is 74 th percentile for age/sex   2.  Mildly dilated ascending thoracic aorta 3.8 cm   3.  CAD RADS 2 non obstructive CAD see description above   4.  Moderate Mitral annular Calcification  Cardiac monitor 08/30/2022:   Patch Wear Time:  7 days and 3 hours (2024-01-25T12:08:04-499 to 2024-02-01T15:34:25-0500)   Sinus rhythm  Rates 79 to 124 bpm   Average HR 94 bpm Atrial fibrillation   6 episodes   (36% of time)  Longest episode lasted 2 days 10 hours    Rates 66 to 213 bpm) Frequent PVCs (7.8%)   Occasional PACs   Short bursts of SVT   Fastest for 5 beats at 203 bpm, longest 20 sec.  Past Medical History:  Diagnosis Date   ADD (attention deficit disorder)    Allergy    Anemia    Arthritis 07/22/2014   osteoarthritis left knee   Colon polyps    Dysrhythmia    A-fib   Hypertension    Left knee pain    chronic   Narcotic addiction (HCC)    Neuropathy    bilateral feet   Obesity    post gastric bypass   Spinal stenosis    Vitamin D deficiency     Past Surgical History:  Procedure Laterality Date   arthscopic knee surgery Left    several times   ATRIAL FIBRILLATION ABLATION N/A 04/11/2023   Procedure: ATRIAL FIBRILLATION ABLATION;  Surgeon: Maurice Small, MD;  [facility];  Service: Cardiovascular;  Laterality: N/A;   BREAST BIOPSY     BREAST SURGERY  years ago   breast biopsy   CHOLECYSTECTOMY     EYE  SURGERY Bilateral 2013   Toric Lens implants   EYE SURGERY Right 2014   corneal amniotic membrane   GASTRIC BYPASS  2003   IR SACROPLASTY BILATERAL  01/07/2023   KIDNEY DONATION  2004   ROTATOR CUFF REPAIR Right    TOTAL KNEE ARTHROPLASTY Left 06/16/2015   Procedure: LEFT TOTAL KNEE ARTHROPLASTY;  Surgeon: Kathryne Hitch, MD;  Location: [facility];  Service: Orthopedics;  Laterality: Left;   TOTAL KNEE ARTHROPLASTY Right 05/04/2021   Procedure: RIGHT TOTAL KNEE ARTHROPLASTY;  Surgeon: Kathryne Hitch, MD;  Location: [facility];  Service: Orthopedics;  Laterality: Right;    MEDICATIONS:  ALPRAZolam (XANAX) 0.25 MG tablet   amphetamine-dextroamphetamine (ADDERALL XR) 30 MG 24 hr capsule   amphetamine-dextroamphetamine (ADDERALL XR) 30 MG 24 hr capsule   amphetamine-dextroamphetamine (  ADDERALL XR) 30 MG 24 hr capsule   amphetamine-dextroamphetamine (ADDERALL) 20 MG tablet   amphetamine-dextroamphetamine (ADDERALL) 20 MG tablet   amphetamine-dextroamphetamine (ADDERALL) 20 MG tablet   apixaban (ELIQUIS) 5 MG TABS tablet   atorvastatin (LIPITOR) 40 MG tablet   Cholecalciferol (VITAMIN D3) 50 MCG (2000 UT) TABS   empagliflozin (JARDIANCE) 10 MG TABS tablet   FLUoxetine (PROZAC) 40 MG capsule   furosemide (LASIX) 40 MG tablet   gabapentin (NEURONTIN) 300 MG capsule   lamoTRIgine (LAMICTAL) 100 MG tablet   losartan (COZAAR) 25 MG tablet   melatonin 3 MG TABS tablet   metoprolol succinate (TOPROL XL) 25 MG 24 hr tablet   metoprolol succinate (TOPROL-XL) 50 MG 24 hr tablet   Multiple Vitamins-Minerals (PRESERVISION AREDS 2) CAPS   polyethylene glycol (MIRALAX / GLYCOLAX) 17 g packet   potassium chloride (KLOR-CON M) 10 MEQ tablet   senna-docusate (SENOKOT-S) 8.6-50 MG tablet   spironolactone (ALDACTONE) 25 MG tablet   zolpidem (AMBIEN) 5 MG tablet   No current facility-administered medications for this encounter.   Marcille Blanco [facility] Short  Stay/Anesthesiology [facility] Phone [phone] 10/07/2023 12:45 PM

## 2023-10-08 ENCOUNTER — Encounter: Payer: Self-pay | Admitting: Neurology

## 2023-10-08 DIAGNOSIS — M21372 Foot drop, left foot: Secondary | ICD-10-CM | POA: Diagnosis not present

## 2023-10-11 ENCOUNTER — Other Ambulatory Visit: Payer: Self-pay | Admitting: Internal Medicine

## 2023-10-13 DIAGNOSIS — R262 Difficulty in walking, not elsewhere classified: Secondary | ICD-10-CM | POA: Diagnosis not present

## 2023-10-13 NOTE — Telephone Encounter (Signed)
 Prescription refill request for Eliquis received. Indication: AF Last office visit: 07/21/23  A Mealor MD Scr: 1.48 on 10/06/23  Epic Age: 73 Weight: 86.5kg  Based on above findings Eliquis 5mg  twice daily is the appropriate dose.  Refill approved.

## 2023-10-14 ENCOUNTER — Observation Stay (HOSPITAL_COMMUNITY)
Admission: RE | Admit: 2023-10-14 | Discharge: 2023-10-15 | Disposition: A | Discharge status: Home / Self Care | Attending: Neurosurgery | Admitting: Neurosurgery

## 2023-10-14 ENCOUNTER — Other Ambulatory Visit: Payer: Self-pay

## 2023-10-14 ENCOUNTER — Encounter (HOSPITAL_COMMUNITY)
Admission: RE | Disposition: A | Discharge status: Home / Self Care | Payer: Self-pay | Source: Home / Self Care | Attending: Neurosurgery

## 2023-10-14 ENCOUNTER — Encounter (HOSPITAL_COMMUNITY): Payer: Self-pay

## 2023-10-14 ENCOUNTER — Ambulatory Visit (HOSPITAL_BASED_OUTPATIENT_CLINIC_OR_DEPARTMENT_OTHER)

## 2023-10-14 ENCOUNTER — Ambulatory Visit (HOSPITAL_COMMUNITY): Payer: Self-pay | Admitting: Medical

## 2023-10-14 ENCOUNTER — Ambulatory Visit (HOSPITAL_COMMUNITY)

## 2023-10-14 DIAGNOSIS — Z981 Arthrodesis status: Secondary | ICD-10-CM | POA: Diagnosis not present

## 2023-10-14 DIAGNOSIS — M48061 Spinal stenosis, lumbar region without neurogenic claudication: Principal | ICD-10-CM | POA: Diagnosis present

## 2023-10-14 DIAGNOSIS — I4819 Other persistent atrial fibrillation: Secondary | ICD-10-CM | POA: Diagnosis not present

## 2023-10-14 DIAGNOSIS — I110 Hypertensive heart disease with heart failure: Secondary | ICD-10-CM | POA: Insufficient documentation

## 2023-10-14 DIAGNOSIS — Z87891 Personal history of nicotine dependence: Secondary | ICD-10-CM | POA: Insufficient documentation

## 2023-10-14 DIAGNOSIS — I5023 Acute on chronic systolic (congestive) heart failure: Secondary | ICD-10-CM | POA: Insufficient documentation

## 2023-10-14 DIAGNOSIS — I5022 Chronic systolic (congestive) heart failure: Secondary | ICD-10-CM | POA: Insufficient documentation

## 2023-10-14 DIAGNOSIS — Z96653 Presence of artificial knee joint, bilateral: Secondary | ICD-10-CM | POA: Insufficient documentation

## 2023-10-14 DIAGNOSIS — M48062 Spinal stenosis, lumbar region with neurogenic claudication: Secondary | ICD-10-CM

## 2023-10-14 DIAGNOSIS — Z79899 Other long term (current) drug therapy: Secondary | ICD-10-CM | POA: Insufficient documentation

## 2023-10-14 DIAGNOSIS — M4807 Spinal stenosis, lumbosacral region: Principal | ICD-10-CM | POA: Insufficient documentation

## 2023-10-14 DIAGNOSIS — Z7901 Long term (current) use of anticoagulants: Secondary | ICD-10-CM | POA: Diagnosis not present

## 2023-10-14 DIAGNOSIS — M8588 Other specified disorders of bone density and structure, other site: Secondary | ICD-10-CM | POA: Diagnosis not present

## 2023-10-14 HISTORY — PX: FORAMINOTOMY 2 LEVEL: SHX5836

## 2023-10-14 SURGERY — FORAMINOTOMY 2 LEVEL
Anesthesia: General

## 2023-10-14 MED ORDER — ACETAMINOPHEN 650 MG RE SUPP: 650.0000 mg | RECTAL | Status: DC | PRN

## 2023-10-14 MED ORDER — FENTANYL CITRATE (PF) 250 MCG/5ML IJ SOLN
INTRAMUSCULAR | Status: DC | PRN
  Administered 2023-10-14: 50 ug via INTRAVENOUS
  Administered 2023-10-14: 100 ug via INTRAVENOUS
  Administered 2023-10-14: 50 ug via INTRAVENOUS

## 2023-10-14 MED ORDER — LIDOCAINE 2% (20 MG/ML) 5 ML SYRINGE
INTRAMUSCULAR | Status: DC | PRN
  Administered 2023-10-14: 60 mg via INTRAVENOUS

## 2023-10-14 MED ORDER — OXYCODONE HCL 5 MG PO TABS
5.0000 mg | ORAL_TABLET | ORAL | Status: DC | PRN
  Administered 2023-10-14 – 2023-10-15 (×3): 5 mg via ORAL
  Filled 2023-10-14 (×3): qty 1

## 2023-10-14 MED ORDER — OXYCODONE HCL 5 MG/5ML PO SOLN
ORAL | Status: AC
  Filled 2023-10-14: qty 5

## 2023-10-14 MED ORDER — FLEET ENEMA RE ENEM: 1.0000 | ENEMA | Freq: Once | RECTAL | Status: DC | PRN

## 2023-10-14 MED ORDER — PHENOL 1.4 % MT LIQD: 1.0000 | OROMUCOSAL | Status: DC | PRN

## 2023-10-14 MED ORDER — ONDANSETRON HCL 4 MG PO TABS: 4.0000 mg | ORAL_TABLET | Freq: Four times a day (QID) | ORAL | Status: DC | PRN

## 2023-10-14 MED ORDER — LOSARTAN POTASSIUM 25 MG PO TABS
25.0000 mg | ORAL_TABLET | Freq: Every day | ORAL | Status: DC
Start: 2023-10-15 — End: 2023-10-15
  Administered 2023-10-15: 25 mg via ORAL
  Filled 2023-10-14: qty 1

## 2023-10-14 MED ORDER — ATORVASTATIN CALCIUM 40 MG PO TABS
40.0000 mg | ORAL_TABLET | Freq: Every day | ORAL | Status: DC
  Administered 2023-10-15: 40 mg via ORAL
  Filled 2023-10-14: qty 1

## 2023-10-14 MED ORDER — VANCOMYCIN HCL IN DEXTROSE 1-5 GM/200ML-% IV SOLN
1000.0000 mg | INTRAVENOUS | Status: DC
  Administered 2023-10-15: 1000 mg via INTRAVENOUS
  Filled 2023-10-14: qty 200

## 2023-10-14 MED ORDER — OXYCODONE HCL 5 MG/5ML PO SOLN
5.0000 mg | Freq: Once | ORAL | Status: AC | PRN
  Administered 2023-10-14: 5 mg via ORAL

## 2023-10-14 MED ORDER — EMPAGLIFLOZIN 10 MG PO TABS
10.0000 mg | ORAL_TABLET | Freq: Every day | ORAL | Status: DC
  Administered 2023-10-15: 10 mg via ORAL
  Filled 2023-10-14: qty 1

## 2023-10-14 MED ORDER — KETOROLAC TROMETHAMINE 15 MG/ML IJ SOLN
7.5000 mg | Freq: Four times a day (QID) | INTRAMUSCULAR | Status: AC
  Administered 2023-10-14 – 2023-10-15 (×4): 7.5 mg via INTRAVENOUS
  Filled 2023-10-14 (×4): qty 1

## 2023-10-14 MED ORDER — GABAPENTIN 300 MG PO CAPS
300.0000 mg | ORAL_CAPSULE | ORAL | Status: DC
Start: 2023-10-14 — End: 2023-10-14

## 2023-10-14 MED ORDER — ONDANSETRON HCL 4 MG/2ML IJ SOLN: 4.0000 mg | Freq: Four times a day (QID) | INTRAMUSCULAR | Status: DC | PRN

## 2023-10-14 MED ORDER — ACETAMINOPHEN 325 MG PO TABS: 650.0000 mg | ORAL_TABLET | ORAL | Status: DC | PRN

## 2023-10-14 MED ORDER — AMPHETAMINE-DEXTROAMPHET ER 10 MG PO CP24
30.0000 mg | ORAL_CAPSULE | Freq: Every day | ORAL | Status: DC
  Administered 2023-10-15: 30 mg via ORAL
  Filled 2023-10-14 (×2): qty 3

## 2023-10-14 MED ORDER — LIDOCAINE-EPINEPHRINE 1 %-1:100000 IJ SOLN
INTRAMUSCULAR | Status: AC
  Filled 2023-10-14: qty 1

## 2023-10-14 MED ORDER — ORAL CARE MOUTH RINSE: 15.0000 mL | Freq: Once | OROMUCOSAL | Status: AC

## 2023-10-14 MED ORDER — BUPIVACAINE HCL (PF) 0.5 % IJ SOLN
INTRAMUSCULAR | Status: AC
  Filled 2023-10-14: qty 30

## 2023-10-14 MED ORDER — METHYLPREDNISOLONE ACETATE 80 MG/ML IJ SUSP
INTRAMUSCULAR | Status: AC
  Filled 2023-10-14: qty 1

## 2023-10-14 MED ORDER — DOCUSATE SODIUM 100 MG PO CAPS
100.0000 mg | ORAL_CAPSULE | Freq: Two times a day (BID) | ORAL | Status: DC
  Administered 2023-10-14 – 2023-10-15 (×2): 100 mg via ORAL
  Filled 2023-10-14 (×2): qty 1

## 2023-10-14 MED ORDER — OXYCODONE HCL 5 MG PO TABS
10.0000 mg | ORAL_TABLET | ORAL | Status: DC | PRN
  Administered 2023-10-14 (×2): 10 mg via ORAL
  Filled 2023-10-14 (×2): qty 2

## 2023-10-14 MED ORDER — HYDROMORPHONE HCL 1 MG/ML IJ SOLN: 0.5000 mg | INTRAMUSCULAR | Status: DC | PRN

## 2023-10-14 MED ORDER — BUPIVACAINE HCL (PF) 0.5 % IJ SOLN
INTRAMUSCULAR | Status: DC | PRN
  Administered 2023-10-14: 20 mL
  Administered 2023-10-14: 5 mL

## 2023-10-14 MED ORDER — EPHEDRINE 5 MG/ML INJ
INTRAVENOUS | Status: AC
  Filled 2023-10-14: qty 5

## 2023-10-14 MED ORDER — CHLORHEXIDINE GLUCONATE 0.12 % MT SOLN
15.0000 mL | Freq: Once | OROMUCOSAL | Status: AC
  Administered 2023-10-14: 15 mL via OROMUCOSAL
  Filled 2023-10-14: qty 15

## 2023-10-14 MED ORDER — METHOCARBAMOL 500 MG PO TABS
500.0000 mg | ORAL_TABLET | Freq: Four times a day (QID) | ORAL | Status: DC | PRN
  Administered 2023-10-14 – 2023-10-15 (×3): 500 mg via ORAL
  Filled 2023-10-14 (×3): qty 1

## 2023-10-14 MED ORDER — SPIRONOLACTONE 12.5 MG HALF TABLET
12.5000 mg | ORAL_TABLET | Freq: Every day | ORAL | Status: DC
  Administered 2023-10-15: 12.5 mg via ORAL
  Filled 2023-10-14: qty 1

## 2023-10-14 MED ORDER — SODIUM CHLORIDE 0.9% FLUSH: 3.0000 mL | INTRAVENOUS | Status: DC | PRN

## 2023-10-14 MED ORDER — ALPRAZOLAM 0.25 MG PO TABS: 0.2500 mg | ORAL_TABLET | Freq: Every evening | ORAL | Status: DC | PRN

## 2023-10-14 MED ORDER — EPHEDRINE SULFATE-NACL 50-0.9 MG/10ML-% IV SOSY
PREFILLED_SYRINGE | INTRAVENOUS | Status: DC | PRN
  Administered 2023-10-14: 5 mg via INTRAVENOUS

## 2023-10-14 MED ORDER — PHENYLEPHRINE 80 MCG/ML (10ML) SYRINGE FOR IV PUSH (FOR BLOOD PRESSURE SUPPORT)
PREFILLED_SYRINGE | INTRAVENOUS | Status: AC
  Filled 2023-10-14: qty 10

## 2023-10-14 MED ORDER — LIDOCAINE-EPINEPHRINE 1 %-1:100000 IJ SOLN
INTRAMUSCULAR | Status: DC | PRN
  Administered 2023-10-14: 5 mL

## 2023-10-14 MED ORDER — THROMBIN 5000 UNITS EX KIT
PACK | CUTANEOUS | Status: AC
  Filled 2023-10-14: qty 1

## 2023-10-14 MED ORDER — VANCOMYCIN HCL IN DEXTROSE 1-5 GM/200ML-% IV SOLN
1000.0000 mg | INTRAVENOUS | Status: AC
  Administered 2023-10-14: 1000 mg via INTRAVENOUS
  Filled 2023-10-14: qty 200

## 2023-10-14 MED ORDER — FENTANYL CITRATE (PF) 100 MCG/2ML IJ SOLN
25.0000 ug | INTRAMUSCULAR | Status: DC | PRN
  Administered 2023-10-14 (×3): 25 ug via INTRAVENOUS

## 2023-10-14 MED ORDER — MELATONIN 3 MG PO TABS
3.0000 mg | ORAL_TABLET | Freq: Every day | ORAL | Status: DC
  Administered 2023-10-14: 3 mg via ORAL
  Filled 2023-10-14: qty 1

## 2023-10-14 MED ORDER — OXYCODONE HCL 5 MG PO TABS: 5.0000 mg | ORAL_TABLET | Freq: Once | ORAL | Status: AC | PRN

## 2023-10-14 MED ORDER — PROPOFOL 10 MG/ML IV BOLUS
INTRAVENOUS | Status: AC
  Filled 2023-10-14: qty 20

## 2023-10-14 MED ORDER — METOPROLOL SUCCINATE 12.5 MG HALF TABLET: 12.5000 mg | ORAL_TABLET | Freq: Every day | ORAL | Status: DC

## 2023-10-14 MED ORDER — ZOLPIDEM TARTRATE 5 MG PO TABS
5.0000 mg | ORAL_TABLET | Freq: Every evening | ORAL | Status: DC | PRN
Start: 2023-10-14 — End: 2023-10-15
  Administered 2023-10-14: 5 mg via ORAL
  Filled 2023-10-14: qty 1

## 2023-10-14 MED ORDER — MIDAZOLAM HCL 2 MG/2ML IJ SOLN
INTRAMUSCULAR | Status: DC | PRN
  Administered 2023-10-14: 1 mg via INTRAVENOUS

## 2023-10-14 MED ORDER — THROMBIN 5000 UNITS EX SOLR
OROMUCOSAL | Status: DC | PRN
  Administered 2023-10-14: 5 mL via TOPICAL

## 2023-10-14 MED ORDER — FENTANYL CITRATE (PF) 250 MCG/5ML IJ SOLN
INTRAMUSCULAR | Status: AC
  Filled 2023-10-14: qty 5

## 2023-10-14 MED ORDER — METHYLPREDNISOLONE ACETATE 80 MG/ML IJ SUSP
INTRAMUSCULAR | Status: DC | PRN
  Administered 2023-10-14: 40 mg

## 2023-10-14 MED ORDER — PHENYLEPHRINE HCL-NACL 20-0.9 MG/250ML-% IV SOLN
INTRAVENOUS | Status: DC | PRN
  Administered 2023-10-14: 30 ug/min via INTRAVENOUS

## 2023-10-14 MED ORDER — LAMOTRIGINE 100 MG PO TABS
100.0000 mg | ORAL_TABLET | Freq: Two times a day (BID) | ORAL | Status: DC
  Administered 2023-10-14 – 2023-10-15 (×2): 100 mg via ORAL
  Filled 2023-10-14 (×2): qty 1

## 2023-10-14 MED ORDER — DEXAMETHASONE SODIUM PHOSPHATE 10 MG/ML IJ SOLN
INTRAMUSCULAR | Status: AC
  Filled 2023-10-14: qty 1

## 2023-10-14 MED ORDER — METOPROLOL SUCCINATE ER 25 MG PO TB24
25.0000 mg | ORAL_TABLET | Freq: Every day | ORAL | Status: DC
  Administered 2023-10-14: 25 mg via ORAL
  Filled 2023-10-14: qty 1

## 2023-10-14 MED ORDER — FLUOXETINE HCL 20 MG PO CAPS
40.0000 mg | ORAL_CAPSULE | Freq: Every day | ORAL | Status: DC
  Administered 2023-10-15: 40 mg via ORAL
  Filled 2023-10-14: qty 2

## 2023-10-14 MED ORDER — ROCURONIUM BROMIDE 10 MG/ML (PF) SYRINGE
PREFILLED_SYRINGE | INTRAVENOUS | Status: AC
  Filled 2023-10-14: qty 10

## 2023-10-14 MED ORDER — ONDANSETRON HCL 4 MG/2ML IJ SOLN
INTRAMUSCULAR | Status: DC | PRN
Start: 2023-10-14 — End: 2023-10-14
  Administered 2023-10-14: 4 mg via INTRAVENOUS

## 2023-10-14 MED ORDER — SODIUM CHLORIDE 0.9% FLUSH
3.0000 mL | Freq: Two times a day (BID) | INTRAVENOUS | Status: DC
  Administered 2023-10-14 – 2023-10-15 (×3): 3 mL via INTRAVENOUS

## 2023-10-14 MED ORDER — GABAPENTIN 300 MG PO CAPS
600.0000 mg | ORAL_CAPSULE | Freq: Every day | ORAL | Status: DC
Start: 2023-10-14 — End: 2023-10-15
  Administered 2023-10-14: 600 mg via ORAL
  Filled 2023-10-14: qty 2

## 2023-10-14 MED ORDER — CHLORHEXIDINE GLUCONATE CLOTH 2 % EX PADS: 6.0000 | MEDICATED_PAD | Freq: Once | CUTANEOUS | Status: DC

## 2023-10-14 MED ORDER — ROCURONIUM BROMIDE 10 MG/ML (PF) SYRINGE
PREFILLED_SYRINGE | INTRAVENOUS | Status: DC | PRN
  Administered 2023-10-14: 50 mg via INTRAVENOUS
  Administered 2023-10-14: 20 mg via INTRAVENOUS
  Administered 2023-10-14: 5 mg via INTRAVENOUS

## 2023-10-14 MED ORDER — GABAPENTIN 300 MG PO CAPS
300.0000 mg | ORAL_CAPSULE | Freq: Two times a day (BID) | ORAL | Status: DC
  Administered 2023-10-14 – 2023-10-15 (×2): 300 mg via ORAL
  Filled 2023-10-14 (×2): qty 1

## 2023-10-14 MED ORDER — LIDOCAINE 2% (20 MG/ML) 5 ML SYRINGE
INTRAMUSCULAR | Status: AC
  Filled 2023-10-14: qty 5

## 2023-10-14 MED ORDER — FUROSEMIDE 20 MG PO TABS: 20.0000 mg | ORAL_TABLET | Freq: Every day | ORAL | Status: DC

## 2023-10-14 MED ORDER — LACTATED RINGERS IV SOLN: INTRAVENOUS | Status: DC

## 2023-10-14 MED ORDER — 0.9 % SODIUM CHLORIDE (POUR BTL) OPTIME
TOPICAL | Status: DC | PRN
  Administered 2023-10-14: 1000 mL

## 2023-10-14 MED ORDER — PROPOFOL 10 MG/ML IV BOLUS
INTRAVENOUS | Status: DC | PRN
  Administered 2023-10-14: 150 mg via INTRAVENOUS

## 2023-10-14 MED ORDER — SODIUM CHLORIDE 0.9 % IV SOLN
250.0000 mL | INTRAVENOUS | Status: AC
  Administered 2023-10-14: 250 mL via INTRAVENOUS

## 2023-10-14 MED ORDER — SUGAMMADEX SODIUM 200 MG/2ML IV SOLN
INTRAVENOUS | Status: DC | PRN
  Administered 2023-10-14: 200 mg via INTRAVENOUS

## 2023-10-14 MED ORDER — POTASSIUM CHLORIDE CRYS ER 10 MEQ PO TBCR
10.0000 meq | EXTENDED_RELEASE_TABLET | Freq: Every day | ORAL | Status: DC
  Administered 2023-10-15: 10 meq via ORAL
  Filled 2023-10-14: qty 1

## 2023-10-14 MED ORDER — POLYETHYLENE GLYCOL 3350 17 G PO PACK: 17.0000 g | PACK | Freq: Every day | ORAL | Status: DC | PRN

## 2023-10-14 MED ORDER — FENTANYL CITRATE (PF) 100 MCG/2ML IJ SOLN
INTRAMUSCULAR | Status: AC
  Filled 2023-10-14: qty 2

## 2023-10-14 MED ORDER — AMPHETAMINE-DEXTROAMPHETAMINE 10 MG PO TABS: 20.0000 mg | ORAL_TABLET | Freq: Every day | ORAL | Status: DC | PRN

## 2023-10-14 MED ORDER — METHOCARBAMOL 1000 MG/10ML IJ SOLN: 500.0000 mg | Freq: Four times a day (QID) | INTRAMUSCULAR | Status: DC | PRN

## 2023-10-14 MED ORDER — DEXAMETHASONE SODIUM PHOSPHATE 10 MG/ML IJ SOLN
INTRAMUSCULAR | Status: DC | PRN
  Administered 2023-10-14: 10 mg via INTRAVENOUS

## 2023-10-14 MED ORDER — MIDAZOLAM HCL 2 MG/2ML IJ SOLN
INTRAMUSCULAR | Status: AC
  Filled 2023-10-14: qty 2

## 2023-10-14 MED ORDER — MENTHOL 3 MG MT LOZG: 1.0000 | LOZENGE | OROMUCOSAL | Status: DC | PRN

## 2023-10-14 SURGICAL SUPPLY — 53 items
BAG COUNTER SPONGE SURGICOUNT (BAG) ×2 IMPLANT
BAND RUBBER #18 3X1/16 STRL (MISCELLANEOUS) ×4 IMPLANT
BENZOIN TINCTURE PRP APPL 2/3 (GAUZE/BANDAGES/DRESSINGS) ×2 IMPLANT
BLADE CLIPPER SURG (BLADE) IMPLANT
BUR CARBIDE MATCH 3.0 (BURR) IMPLANT
BUR PRECISION FLUTE 5.0 (BURR) IMPLANT
CANISTER SUCT 3000ML PPV (MISCELLANEOUS) ×2 IMPLANT
DERMABOND ADVANCED .7 DNX12 (GAUZE/BANDAGES/DRESSINGS) IMPLANT
DRAIN 7X20 FLAT PERF LF SIL ST (DRAIN) IMPLANT
DRAPE LAPAROTOMY 100X72X124 (DRAPES) ×2 IMPLANT
DRAPE MICROSCOPE SLANT 54X150 (MISCELLANEOUS) ×2 IMPLANT
DRAPE SURG 17X23 STRL (DRAPES) ×2 IMPLANT
DRESSING MEPILEX FLEX 4X4 (GAUZE/BANDAGES/DRESSINGS) IMPLANT
DRSG MEPILEX FLEX 4X4 (GAUZE/BANDAGES/DRESSINGS) IMPLANT
DRSG OPSITE POSTOP 3X4 (GAUZE/BANDAGES/DRESSINGS) ×2 IMPLANT
DRSG OPSITE POSTOP 4X6 (GAUZE/BANDAGES/DRESSINGS) IMPLANT
DURAPREP 26ML APPLICATOR (WOUND CARE) ×2 IMPLANT
ELECT BLADE INSULATED 4IN (ELECTROSURGICAL) ×1 IMPLANT
ELECT BLADE INSULATED 6.5IN (ELECTROSURGICAL) ×1 IMPLANT
ELECT REM PT RETURN 9FT ADLT (ELECTROSURGICAL) ×1 IMPLANT
ELECTRODE BLADE INSULATED 4IN (ELECTROSURGICAL) ×2 IMPLANT
ELECTRODE BLDE INSULATED 6.5IN (ELECTROSURGICAL) IMPLANT
ELECTRODE REM PT RTRN 9FT ADLT (ELECTROSURGICAL) ×2 IMPLANT
EVACUATOR SILICONE 100CC (DRAIN) IMPLANT
GAUZE 4X4 16PLY ~~LOC~~+RFID DBL (SPONGE) IMPLANT
GLOVE BIO SURGEON STRL SZ7 (GLOVE) ×4 IMPLANT
GLOVE BIOGEL PI IND STRL 7.5 (GLOVE) ×6 IMPLANT
GLOVE ECLIPSE 7.5 STRL STRAW (GLOVE) ×2 IMPLANT
GLOVE EXAM NITRILE XL STR (GLOVE) IMPLANT
GOWN STRL REUS W/ TWL LRG LVL3 (GOWN DISPOSABLE) ×4 IMPLANT
GOWN STRL REUS W/ TWL XL LVL3 (GOWN DISPOSABLE) ×2 IMPLANT
GOWN STRL REUS W/TWL 2XL LVL3 (GOWN DISPOSABLE) IMPLANT
HEMOSTAT POWDER KIT SURGIFOAM (HEMOSTASIS) ×2 IMPLANT
KIT BASIN OR (CUSTOM PROCEDURE TRAY) ×2 IMPLANT
KIT TURNOVER KIT B (KITS) ×2 IMPLANT
NDL HYPO 22X1.5 SAFETY MO (MISCELLANEOUS) ×2 IMPLANT
NDL SPNL 18GX3.5 QUINCKE PK (NEEDLE) IMPLANT
NEEDLE HYPO 22X1.5 SAFETY MO (MISCELLANEOUS) ×1 IMPLANT
NEEDLE SPNL 18GX3.5 QUINCKE PK (NEEDLE) IMPLANT
NS IRRIG 1000ML POUR BTL (IV SOLUTION) ×2 IMPLANT
PACK LAMINECTOMY NEURO (CUSTOM PROCEDURE TRAY) ×2 IMPLANT
PAD ARMBOARD POSITIONER FOAM (MISCELLANEOUS) ×6 IMPLANT
SPIKE FLUID TRANSFER (MISCELLANEOUS) ×2 IMPLANT
SPONGE SURGIFOAM ABS GEL SZ50 (HEMOSTASIS) IMPLANT
SPONGE T-LAP 4X18 ~~LOC~~+RFID (SPONGE) IMPLANT
STRIP CLOSURE SKIN 1/2X4 (GAUZE/BANDAGES/DRESSINGS) ×2 IMPLANT
SUT MNCRL AB 4-0 PS2 18 (SUTURE) ×2 IMPLANT
SUT VIC AB 0 CT1 18XCR BRD8 (SUTURE) ×2 IMPLANT
SUT VIC AB 2-0 CP2 18 (SUTURE) ×2 IMPLANT
SYR 3ML LL SCALE MARK (SYRINGE) IMPLANT
TOWEL GREEN STERILE (TOWEL DISPOSABLE) ×2 IMPLANT
TOWEL GREEN STERILE FF (TOWEL DISPOSABLE) ×2 IMPLANT
WATER STERILE IRR 1000ML POUR (IV SOLUTION) ×2 IMPLANT

## 2023-10-14 NOTE — Transfer of Care (Signed)
 Immediate Anesthesia Transfer of Care Note  Patient: Leslie [name]  Procedure(s) Performed: Posterior Lumbar Decompression Lumbar four-lumbar five Lumbar five -Lumbar Sacral one  Patient Location: PACU  Anesthesia Type:General  Level of Consciousness: awake, alert , oriented, and patient cooperative  Airway & Oxygen Therapy: Patient Spontanous Breathing and Patient connected to face mask oxygen  Post-op Assessment: Report given to RN, Post -op Vital signs reviewed and stable, and Patient moving all extremities X 4  Post vital signs: Reviewed and stable  Last Vitals:  Vitals Value Taken Time  BP 118/54 10/14/23 1042  Temp 97.6   Pulse 58 10/14/23 1045  Resp 19 10/14/23 1045  SpO2 94 % 10/14/23 1045  Vitals shown include unfiled device data.  Last Pain:  Vitals:   10/14/23 0630  TempSrc:   PainSc: 0-No pain         Complications: No notable events documented.

## 2023-10-14 NOTE — Anesthesia Procedure Notes (Addendum)
 Procedure Name: Intubation Date/Time: 10/14/2023 8:04 AM  Performed by: Sandie Ano, CRNAPre-anesthesia Checklist: Patient identified, Emergency Drugs available, Suction available and Patient being monitored Patient Re-evaluated:Patient Re-evaluated prior to induction Oxygen Delivery Method: Circle system utilized Preoxygenation: Pre-oxygenation with 100% oxygen Induction Type: IV induction Ventilation: Mask ventilation without difficulty Laryngoscope Size: Miller and 2 Grade View: Grade I Tube type: Oral Tube size: 7.0 mm Number of attempts: 1 Airway Equipment and Method: Stylet and Bite block Placement Confirmation: ETT inserted through vocal cords under direct vision, positive ETCO2 and breath sounds checked- equal and bilateral Secured at: 21 cm Tube secured with: Tape Dental Injury: Teeth and Oropharynx as per pre-operative assessment  Comments: Airway by Evaristo Bury SRNA

## 2023-10-14 NOTE — H&P (Addendum)
 CC: ambulatory difficulty  HPI:     Patient is a 73 y.o. female presents with right greater than left pain and numbness, with progressive walking difficulty.  She was found to have severe lumbar stenosis.    Patient Active Problem List   Diagnosis Date Noted   Frequent PVCs 07/21/2023   Hypercoagulable state due to persistent atrial fibrillation (HCC) 05/14/2023   Encounter for monitoring amiodarone therapy 05/14/2023   Persistent atrial fibrillation (HCC) 05/14/2023   Tylenol overdose 01/28/2023   Acetaminophen overdose 01/26/2023   Malnutrition of moderate degree 01/08/2023   Sacral fracture (HCC) 01/02/2023   Chronic HFrEF (heart failure with reduced ejection fraction) (HCC) 01/02/2023   AKI (acute kidney injury) (HCC) 01/02/2023   S/P gastric bypass 01/02/2023   Renal insufficiency 09/19/2022   Acute CHF (congestive heart failure) (HCC) 08/07/2022   Status post right knee replacement 05/04/2021   Vitamin B12 deficiency 03/14/2021   Macrocytosis without anemia 03/14/2021   Vitamin D deficiency 02/17/2020   Sleep disorder 07/10/2018   ADHD (attention deficit hyperactivity disorder) 10/01/2017   Unilateral primary osteoarthritis, right knee 08/20/2017   Chronic pain of both shoulders 08/20/2017   Complete tear of right rotator cuff 04/29/2017   Osteoarthritis of left knee 06/16/2015   Status post total left knee replacement 06/16/2015   Attention deficit disorder 09/14/2007   History of colonic polyps 08/03/2007   Essential hypertension 01/20/2007   OBESITY NOS 12/04/2006   Allergic rhinitis 12/04/2006   GASTROJEJUNOSTOMY, HX OF 12/04/2006   Personal history presenting hazards to health 12/04/2006   Past Medical History:  Diagnosis Date   ADD (attention deficit disorder)    Allergy    Anemia    Arthritis 07/22/2014   osteoarthritis left knee   Colon polyps    Dysrhythmia    A-fib   Hypertension    Left knee pain    chronic   Narcotic addiction (HCC)     Neuropathy    bilateral feet   Obesity    post gastric bypass   Spinal stenosis    Vitamin D deficiency     Past Surgical History:  Procedure Laterality Date   arthscopic knee surgery Left    several times   ATRIAL FIBRILLATION ABLATION N/A 04/11/2023   Procedure: ATRIAL FIBRILLATION ABLATION;  Surgeon: Maurice Small, MD;  Location: [facility];  Service: Cardiovascular;  Laterality: N/A;   BREAST BIOPSY     BREAST SURGERY  years ago   breast biopsy   CHOLECYSTECTOMY     EYE SURGERY Bilateral 2013   Toric Lens implants   EYE SURGERY Right 2014   corneal amniotic membrane   GASTRIC BYPASS  2003   IR SACROPLASTY BILATERAL  01/07/2023   KIDNEY DONATION  2004   ROTATOR CUFF REPAIR Right    TOTAL KNEE ARTHROPLASTY Left 06/16/2015   Procedure: LEFT TOTAL KNEE ARTHROPLASTY;  Surgeon: Kathryne Hitch, MD;  Location: [facility];  Service: Orthopedics;  Laterality: Left;   TOTAL KNEE ARTHROPLASTY Right 05/04/2021   Procedure: RIGHT TOTAL KNEE ARTHROPLASTY;  Surgeon: Kathryne Hitch, MD;  Location: [facility];  Service: Orthopedics;  Laterality: Right;    Medications Prior to Admission  Medication Sig Dispense Refill Last Dose/Taking   ALPRAZolam (XANAX) 0.25 MG tablet Take 1 tablet (0.25 mg total) by mouth at bedtime as needed for anxiety. 30 tablet 0 Past Week   amphetamine-dextroamphetamine (ADDERALL XR) 30 MG 24 hr capsule Take 1 capsule (30 mg total) by mouth daily. 30  capsule 0 10/13/2023   amphetamine-dextroamphetamine (ADDERALL) 20 MG tablet Take 1 tablet (20 mg total) by mouth daily. 30 tablet 0 Taking   atorvastatin (LIPITOR) 40 MG tablet Take 40 mg by mouth daily.   10/14/2023 at  5:15 AM   Cholecalciferol (VITAMIN D3) 50 MCG (2000 UT) TABS Take 2,000 Units by mouth daily.   10/13/2023   empagliflozin (JARDIANCE) 10 MG TABS tablet Take 1 tablet (10 mg total) by mouth daily before breakfast. 90 tablet 3 Past Week   FLUoxetine (PROZAC) 40 MG capsule TAKE 1  CAPSULE(40 MG) BY MOUTH EVERY MORNING 90 capsule 1 10/14/2023 at  5:15 AM   furosemide (LASIX) 40 MG tablet Take 20 mg by mouth daily.   Past Week   gabapentin (NEURONTIN) 300 MG capsule TAKE 1 CAPSULE BY MOUTH THREE TIMES DAILY (Patient taking differently: Take 300-600 mg by mouth See admin instructions. Take one  capsule in the morning, one capsule in the afternoon, and two capsules in the evening.) 90 capsule 2 10/14/2023 at  5:15 AM   lamoTRIgine (LAMICTAL) 100 MG tablet TAKE 1 TABLET(100 MG) BY MOUTH TWICE DAILY 180 tablet 1 10/14/2023 at  5:15 AM   losartan (COZAAR) 25 MG tablet TAKE 1/2 TABLET(12.5 MG) BY MOUTH DAILY (Patient taking differently: Take 25 mg by mouth daily.) 45 tablet 3 10/13/2023   melatonin 3 MG TABS tablet Take 1 tablet (3 mg total) by mouth at bedtime.  0 10/13/2023   metoprolol succinate (TOPROL-XL) 50 MG 24 hr tablet Take 25 mg by mouth daily.   10/13/2023 at  7:00 PM   Multiple Vitamins-Minerals (PRESERVISION AREDS 2) CAPS Take 1 capsule by mouth 2 (two) times daily.   10/13/2023   potassium chloride (KLOR-CON M) 10 MEQ tablet Take 10 mEq by mouth daily.   10/13/2023   spironolactone (ALDACTONE) 25 MG tablet Take 0.5 tablets (12.5 mg total) by mouth daily. 30 tablet 5 10/13/2023   zolpidem (AMBIEN) 5 MG tablet Take 1 tablet (5 mg total) by mouth at bedtime as needed for sleep. (Patient taking differently: Take 5 mg by mouth at bedtime.) 30 tablet 2 10/13/2023   amphetamine-dextroamphetamine (ADDERALL XR) 30 MG 24 hr capsule Take 1 capsule (30 mg total) by mouth daily. (Patient not taking: Reported on 10/02/2023) 30 capsule 0 Not Taking   amphetamine-dextroamphetamine (ADDERALL XR) 30 MG 24 hr capsule Take 1 capsule (30 mg total) by mouth daily. (Patient not taking: Reported on 10/02/2023) 30 capsule 0 Not Taking   amphetamine-dextroamphetamine (ADDERALL) 20 MG tablet Take 1 tablet (20 mg total) by mouth daily. 30 tablet 0    amphetamine-dextroamphetamine (ADDERALL) 20 MG tablet Take 1  tablet (20 mg total) by mouth daily. (Patient not taking: Reported on 10/02/2023) 30 tablet 0 Not Taking   apixaban (ELIQUIS) 5 MG TABS tablet TAKE 1 TABLET(5 MG) BY MOUTH TWICE DAILY 180 tablet 1 10/09/2023   metoprolol succinate (TOPROL XL) 25 MG 24 hr tablet Take 0.5 tablets (12.5 mg total) by mouth daily. (Patient not taking: Reported on 10/02/2023) 15 tablet 3 Not Taking   polyethylene glycol (MIRALAX / GLYCOLAX) 17 g packet Take 17 g by mouth daily as needed. (Patient not taking: Reported on 10/02/2023) 14 each 0 Not Taking   senna-docusate (SENOKOT-S) 8.6-50 MG tablet Take 2 tablets by mouth 2 (two) times daily. (Patient not taking: Reported on 10/02/2023)   Not Taking   Allergies  Allergen Reactions   Amoxicillin Hives   Misc. Sulfonamide Containing Compounds    Nsaids  Due Gastric Bypass   Sulfamethoxazole Nausea And Vomiting    See patient list of med intolerances due to h/o gastric bypass and nephrectomy    Social History   Tobacco Use   Smoking status: Former    Current packs/day: 0.00    Types: Cigarettes    Quit date: 07/22/1992    Years since quitting: 31.2   Smokeless tobacco: Never  Substance Use Topics   Alcohol use: Yes    Comment: wine daily    Family History  Problem Relation Age of Onset   Heart disease Mother        post CABG history of CHF   COPD Father    Diabetes Sister    Hypertension Sister        post renal transplant   Heart disease Brother        CAD   Stomach cancer Neg Hx    Pancreatic cancer Neg Hx    Colon cancer Neg Hx    Esophageal cancer Neg Hx      Review of Systems Pertinent items are noted in HPI. Pertinent items noted in HPI and remainder of comprehensive ROS otherwise negative.  Objective:   Patient Vitals for the past 8 hrs:  BP Temp Temp src Pulse Resp SpO2 Height Weight  10/14/23 0602 (!) 109/51 97.6 F (36.4 C) Oral 60 17 98 % 5\' 4"  (1.626 m) 85.7 kg   No intake/output data recorded. No intake/output data  recorded.      General : Alert, cooperative, no distress, appears stated age   Head:  Normocephalic/atraumatic    Eyes: PERRL, conjunctiva/corneas clear, EOM's intact. Fundi could not be visualized Neck: Supple Chest:  Respirations unlabored Chest wall: no tenderness or deformity Heart: Regular rate and rhythm Abdomen: Soft, nontender and nondistended Extremities: warm and well-perfused Skin: normal turgor, color and texture Neurologic:  Alert, oriented x 3.  Eyes open spontaneously. PERRL, EOMI, VFC, no facial droop. V1-3 intact.  No dysarthria, tongue protrusion symmetric.  CNII-XII intact. Appears deconditioned. 4/5 strength in [place], 3/5 DF bilaterally        Data ReviewCBC:  Lab Results  Component Value Date   WBC 4.8 10/06/2023   RBC 4.09 10/06/2023   BMP:  Lab Results  Component Value Date   GLUCOSE 93 10/06/2023   CO2 24 10/06/2023   BUN 36 (H) 10/06/2023   BUN 14 04/08/2023   CREATININE 1.48 (H) 10/06/2023   CREATININE 1.15 (H) 02/16/2020   CALCIUM 9.2 10/06/2023   Radiology review:  MRI shows severe lumbar stenosis at L4-5, L5-S1  Assessment:   Active Problems:   * No active hospital problems. *  This is a 73 yo F with hx of CHF, osteopenia s/p sacroplasty who has severe functional immobility and leg weakness and numbness, found to have severe lumbar stenosis.  Plan:   Risks, benefits, alternatives, and expected convalescence were discussed with her.  Risks discussed included, but were not limited to bleeding, pain, infection, scar, spinal fluid leak, neurologic deficit, instability, damage to nearby organs, and death.  Informed consent was obtained.

## 2023-10-14 NOTE — Plan of Care (Signed)

## 2023-10-14 NOTE — Op Note (Addendum)
 PREOP DIAGNOSIS: Lumbar spinal stenosis, L4-5 and L5-S1  POSTOP DIAGNOSIS: Lumbar spinal stenosis, L4-5 and L5-S1  PROCEDURE: 1. L4-5, L5-S1 laminectomy, bilateral medial facetectomy and foraminotomy  SURGEON: Dr. Hoyt Koch, MD  ASSISTANT: Patrici Ranks, PA. Please note, there were no qualified trainees available to assist with the procedure.  An assistant was required for aid in retraction of the neural elements.   ANESTHESIA: General Endotracheal  EBL: 200 ml  SPECIMENS: None  DRAINS: JP drain  COMPLICATIONS: none  CONDITION: Stable to PACU  HISTORY: Leslie [name] is a 73 y.o. female hx of CHF, osteopenia s/p sacroplasty last year who developed severe functional immobility with leg weakness, numbness and pain. [facility] Kitchen MRI showed severe lumbar stenosis at L4-5 and L5-S1. Treatment options were discussed and the patient elected to proceed with surgical decompression by laminectomy and foraminotomies at L4-5, L5-S1.  Risks, benefits, alternatives, and expected convalescence were discussed with her. Risks discussed included, but were not limited to bleeding, pain, infection, scar, spinal fluid leak, neurologic deficit, instability, damage to nearby organs, and death. Informed consent was obtained.   PROCEDURE IN DETAIL: After informed consent was obtained and witnessed, the patient was brought to the operating room. After induction of general anesthesia, the patient was positioned on the operative table in the prone position with all pressure points meticulously padded. The skin of the low back was then prepped and draped in the usual sterile fashion.  Under fluoroscopy, the correct levels were identified and marked out on the skin, and after timeout was conducted, the skin was infiltrated with local anesthetic. Skin incision was then made sharply and Bovie electrocautery was used to dissect the subcutaneous tissue until the lumbodorsal fascia was identified. The fascia was then  incised using Bovie electrocautery and the lamina at the L4-5 and L5-S1 levels was identified and dissection was carried out in the subperiosteal plane. Self-retaining retractor was then placed, and intraoperative x-ray was taken to confirm we were at the correct levels.  Microscope was then introduced into the field to allow for intraoperative microdissection.  Interspinous ligament was removed with Leksell rongeur.  Using a high-speed drill, inferior lamina of L4 and superior lamina of S1 and the full lamina of L5 was removed, and bilateral medial facetectomies were performed at L4-5 and L5-S1. The ligamentum flavum was then identified and removed and the lateral edge of the thecal sac was identified bilaterally. Using a combination of the high-speed drill and Rogers, good lateral decompression was completed. Kerrison rongeurs were then used to complete foraminotomies at L4-5 and L5-S1 bilaterally. The decompression was confirmed using a small ball tip dissector.  Hemostasis was then secured using a combination of morcellized Gelfoam and thrombin and bipolar electrocautery. The wound was irrigated copiously.  Depo medrol was placed over the thecal sac and nerve roots.  Marcaine was injected into the paraspinous muscles and soft tissues.  7 flat JP drain was placed in the subfascial space, tunneled out the skin and secured with a stitch. Self-retaining retractor was then removed, and the wound is closed in layers using a combination of interrupted 0 Vicryl and 2-0 Vicryl stitches followed by 4-0 monocryl in subcuticular manner and Dermabond.  A sterile dressing was placed.   At the end of the case all sponge, needle, and instrument counts were correct. The patient was then transferred to the stretcher and taken to the postanesthesia care unit in stable hemodynamic condition.

## 2023-10-14 NOTE — Progress Notes (Signed)
 Pharmacy Antibiotic Note  Leslie [name] is a 73 y.o. female admitted on 10/14/2023 for lumbar surgery. Pharmacy has been consulted for Vancomycin dosing for surgical prophylaxis. Drain in place.  Vancomycin 1gm IV given pre-op at 0620. Pre-op creatinine = 1.48 on 10/06/23.  Plan: Vancomycin 1gm IV q24h to begin 3/26 am. Target Vanc trough levels 10-15 mcg/ml. Bmet in am. Follow up drain removal and stopping Vanc.  Height: 5\' 4"  (162.6 cm) Weight: 85.7 kg (189 lb) IBW/kg (Calculated) : 54.7  Temp (24hrs), Avg:97.6 F (36.4 C), Min:97.6 F (36.4 C), Max:97.6 F (36.4 C)  Estimated Creatinine Clearance: 35.9 mL/min (A) (by C-G formula based on SCr of 1.48 mg/dL (H)).    Allergies  Allergen Reactions   Amoxicillin Hives   Misc. Sulfonamide Containing Compounds    Nsaids     Due Gastric Bypass   Sulfamethoxazole Nausea And Vomiting    See patient list of med intolerances due to h/o gastric bypass and nephrectomy    Antimicrobials this admission: Vancomcyin 3/25 >>  Dose adjustments this admission: n/a  Microbiology results: 3/17 MRSA PCR:  positive for Staph aureus but negative for MRSA  Thank you for allowing pharmacy to be a part of this patient's care.  Dennie Fetters, RPh 10/14/2023 2:16 PM

## 2023-10-15 ENCOUNTER — Ambulatory Visit: Payer: Medicare PPO | Admitting: Neurology

## 2023-10-15 ENCOUNTER — Encounter (HOSPITAL_COMMUNITY): Payer: Self-pay | Admitting: Neurosurgery

## 2023-10-15 DIAGNOSIS — M4807 Spinal stenosis, lumbosacral region: Secondary | ICD-10-CM | POA: Diagnosis not present

## 2023-10-15 DIAGNOSIS — Z87891 Personal history of nicotine dependence: Secondary | ICD-10-CM | POA: Diagnosis not present

## 2023-10-15 DIAGNOSIS — I5022 Chronic systolic (congestive) heart failure: Secondary | ICD-10-CM | POA: Diagnosis not present

## 2023-10-15 DIAGNOSIS — Z96653 Presence of artificial knee joint, bilateral: Secondary | ICD-10-CM | POA: Diagnosis not present

## 2023-10-15 DIAGNOSIS — Z79899 Other long term (current) drug therapy: Secondary | ICD-10-CM | POA: Diagnosis not present

## 2023-10-15 DIAGNOSIS — I5023 Acute on chronic systolic (congestive) heart failure: Secondary | ICD-10-CM | POA: Diagnosis not present

## 2023-10-15 DIAGNOSIS — Z7901 Long term (current) use of anticoagulants: Secondary | ICD-10-CM | POA: Diagnosis not present

## 2023-10-15 DIAGNOSIS — I4819 Other persistent atrial fibrillation: Secondary | ICD-10-CM | POA: Diagnosis not present

## 2023-10-15 DIAGNOSIS — I110 Hypertensive heart disease with heart failure: Secondary | ICD-10-CM | POA: Diagnosis not present

## 2023-10-15 LAB — BASIC METABOLIC PANEL
Anion gap: 9 (ref 5–15)
BUN: 49 mg/dL — ABNORMAL HIGH (ref 8–23)
CO2: 23 mmol/L (ref 22–32)
Calcium: 8.9 mg/dL (ref 8.9–10.3)
Chloride: 101 mmol/L (ref 98–111)
Creatinine, Ser: 2.09 mg/dL — ABNORMAL HIGH (ref 0.44–1.00)
GFR, Estimated: 25 mL/min — ABNORMAL LOW (ref >60)
Glucose, Bld: 241 mg/dL — ABNORMAL HIGH (ref 70–99)
Potassium: 5.7 mmol/L — ABNORMAL HIGH (ref 3.5–5.1)
Sodium: 133 mmol/L — ABNORMAL LOW (ref 135–145)

## 2023-10-15 MED ORDER — OXYCODONE HCL 5 MG PO TABS: 5.0000 mg | ORAL_TABLET | ORAL | 0 refills | Status: DC | PRN

## 2023-10-15 MED ORDER — METHOCARBAMOL 500 MG PO TABS: 500.0000 mg | ORAL_TABLET | Freq: Four times a day (QID) | ORAL | 1 refills | Status: DC | PRN

## 2023-10-15 NOTE — Progress Notes (Signed)
 Patient alert and oriented, mae's well, voiding adequate amount of urine, swallowing without difficulty, no c/o pain at time of discharge. Patient discharged home with family. Script and discharged instructions given to patient. Patient and family stated understanding of instructions given. Room was checked and accounted for all patient's belongings; discharge instructions concerning his medications, incision care, follow up appointment and when to call the doctor as needed were all discussed with patient by RN and she expressed understanding on the instructions given

## 2023-10-15 NOTE — Plan of Care (Signed)

## 2023-10-15 NOTE — Evaluation (Signed)
 Physical Therapy Evaluation  Patient Details Name: Leslie [name] MRN: [id number] DOB: 01/21/1951 Today's Date: 10/15/2023  History of Present Illness  Pt is a 73 y/o female who presents s/p L4-S1 laminectomy, bilateral medial facetectomy and foraminotomy on 10/14/2023. Pt with right greater than left LE pain and numbness, with progressive walking difficulty. Found to have severe lumbar stenosis. PMH significant for ADD, HTN, neuropathy bil feet, B TKR.   Clinical Impression  Pt admitted with above diagnosis. At the time of PT eval, pt was able to demonstrate transfers with +2 min-mod assist. Noted upper and lower extremity shaking at times and knees buckling during transfer to chair. Daughter, Nehemiah Settle present throughout and supportive. She will be available initially upon d/c 24/7 and will line up assistance for the pt if needed when she leaves the country on a planned trip. Pt was educated on precautions, brace application/wearing schedule, appropriate activity progression, and car transfer. Anticipate pt will be at a transfers only level with family at this time, and recommend progressing ambulation with HHPT as pt is at a high risk for falls at this time. Pt currently with functional limitations due to the deficits listed below (see PT Problem List). Pt will benefit from skilled PT to increase their independence and safety with mobility to allow discharge to the venue listed below.          If plan is discharge home, recommend the following: A lot of help with walking and/or transfers;A lot of help with bathing/dressing/bathroom;Assistance with cooking/housework;Assist for transportation;Help with stairs or ramp for entrance   Can travel by private vehicle        Equipment Recommendations None recommended by PT  Recommendations for Other Services       Functional Status Assessment Patient has had a recent decline in their functional status and demonstrates the ability to make  significant improvements in function in a reasonable and predictable amount of time.     Precautions / Restrictions Precautions Precautions: Fall;Back Precaution Booklet Issued: Yes (comment) Recall of Precautions/Restrictions: Impaired Precaution/Restrictions Comments: Required frequent cues for maintenance of precatuions. Reviewed with pt and daughter, Nehemiah Settle. Required Braces or Orthoses: Spinal Brace Spinal Brace: Lumbar corset;Applied in sitting position Restrictions Weight Bearing Restrictions Per Provider Order: No      Mobility  Bed Mobility Overal bed mobility: Needs Assistance Bed Mobility: Rolling, Sidelying to Sit Rolling: Contact guard assist Sidelying to sit: Min assist       General bed mobility comments: Light assist and hands on guarding to guide pt through the log roll technique. Initially sitting up into long sitting. Pt educated to maintain back precautions and pt returned to supine to practice log roll.    Transfers Overall transfer level: Needs assistance Equipment used: Rolling walker (2 wheels) Transfers: Sit to/from Stand, Bed to chair/wheelchair/BSC Sit to Stand: Mod assist, Min assist, +2 physical assistance, +2 safety/equipment, From elevated surface   Step pivot transfers: Min assist, +2 safety/equipment, +2 physical assistance       General transfer comment: VC's for hand placement on seated surface for safety. Pt with increased posterior lean with this technique, and required +2 mod assist to stand. Opted to let pt place B UE's on walker with therapist stabilizing walker. Pt was able to maintain improved posture and required decreased assistance to stand. Pt required hands on assist for balance, safety, and walker management for stepo pivot to chair.    Ambulation/Gait  Pre-gait activities: Pt was able to take 2-3 steps forward and back to bed x3. Noted BLE's buckling and shaky.    Stairs            Wheelchair  Mobility     Tilt Bed    Modified Rankin (Stroke Patients Only)       Balance Overall balance assessment: Needs assistance Sitting-balance support: Feet supported, No upper extremity supported Sitting balance-Leahy Scale: Poor   Postural control: Posterior lean Standing balance support: Bilateral upper extremity supported, During functional activity, Reliant on assistive device for balance Standing balance-Leahy Scale: Poor                               Pertinent Vitals/Pain Pain Assessment Pain Assessment: Faces Faces Pain Scale: Hurts a little bit Pain Location: back Pain Descriptors / Indicators: Operative site guarding, Grimacing Pain Intervention(s): Limited activity within patient's tolerance, Monitored during session, Repositioned    Home Living Family/patient expects to be discharged to:: Private residence Living Arrangements: Alone Available Help at Discharge: Family;Available 24 hours/day;Personal care attendant Type of Home: House         Home Layout: One level Home Equipment: Tub bench;Rolling Walker (2 wheels);Wheelchair - manual;Other (comment) (drop arm BSC)      Prior Function Prior Level of Function : Needs assist             Mobility Comments: Per pt and daughter, pt could ambulate limited distances around the house for exercise. Pt's daughter would follow with the wheelchair for safety. Pt was mostly using the wheelchair for mobility and utilizing a BSC. ADLs Comments: Uses tub bench, daughter assists     Extremity/Trunk Assessment   Upper Extremity Assessment Upper Extremity Assessment: Defer to OT evaluation    Lower Extremity Assessment Lower Extremity Assessment: LLE deficits/detail LLE Deficits / Details: Pt got an AFO ~1.5 weeks ago per daughter's report.    Cervical / Trunk Assessment Cervical / Trunk Assessment: Back Surgery  Communication   Communication Communication: No apparent difficulties    Cognition  Arousal: Alert Behavior During Therapy: WFL for tasks assessed/performed                             Following commands: Intact       Cueing Cueing Techniques: Verbal cues, Gestural cues, Tactile cues     General Comments      Exercises     Assessment/Plan    PT Assessment Patient needs continued PT services  PT Problem List Decreased strength;Decreased activity tolerance;Decreased balance;Decreased mobility;Decreased coordination;Decreased knowledge of use of DME;Decreased safety awareness;Decreased knowledge of precautions;Pain       PT Treatment Interventions DME instruction;Gait training;Functional mobility training;Therapeutic activities;Balance training;Therapeutic exercise;Patient/family education    PT Goals (Current goals can be found in the Care Plan section)  Acute Rehab PT Goals Patient Stated Goal: Return home today PT Goal Formulation: With patient/family Time For Goal Achievement: 10/22/23 Potential to Achieve Goals: Good    Frequency Min 5X/week     Co-evaluation PT/OT/SLP Co-Evaluation/Treatment: Yes Reason for Co-Treatment: Complexity of the patient's impairments (multi-system involvement);For patient/therapist safety;To address functional/ADL transfers PT goals addressed during session: Mobility/safety with mobility;Proper use of DME;Strengthening/ROM;Balance         AM-PAC PT "6 Clicks" Mobility  Outcome Measure Help needed turning from your back to your side while in a flat bed without using bedrails?: A Little Help  needed moving from lying on your back to sitting on the side of a flat bed without using bedrails?: A Little Help needed moving to and from a bed to a chair (including a wheelchair)?: A Little Help needed standing up from a chair using your arms (e.g., wheelchair or bedside chair)?: A Little Help needed to walk in hospital room?: A Little Help needed climbing 3-5 steps with a railing? : A Little 6 Click Score: 18     End of Session Equipment Utilized During Treatment: Gait belt;Back brace Activity Tolerance: Patient tolerated treatment well Patient left: in chair;with call bell/phone within reach;with family/visitor present Nurse Communication: Mobility status;Precautions PT Visit Diagnosis: Unsteadiness on feet (R26.81);Pain Pain - part of body:  (back)    Time: 1610-9604 PT Time Calculation (min) (ACUTE ONLY): 33 min   Charges:   PT Evaluation $PT Eval Moderate Complexity: 1 Mod   PT General Charges $$ ACUTE PT VISIT: 1 Visit         Conni Slipper, PT, DPT Acute Rehabilitation Services Secure Chat Preferred Office: [phone]   Marylynn Pearson 10/15/2023, 11:47 AM

## 2023-10-15 NOTE — Anesthesia Postprocedure Evaluation (Signed)
 Anesthesia Post Note  Patient: Leslie [name]  Procedure(s) Performed: Posterior Lumbar Decompression Lumbar four-lumbar five Lumbar five -Lumbar Sacral one     Patient location during evaluation: PACU Anesthesia Type: General Level of consciousness: awake and alert Pain management: pain level controlled Vital Signs Assessment: post-procedure vital signs reviewed and stable Respiratory status: spontaneous breathing, nonlabored ventilation, respiratory function stable and patient connected to nasal cannula oxygen Cardiovascular status: blood pressure returned to baseline and stable Postop Assessment: no apparent nausea or vomiting Anesthetic complications: no   There were no known notable events for this encounter.  Last Vitals:  Vitals:   10/14/23 2314 10/15/23 0409  BP: (!) 97/57 104/64  Pulse: (!) 53 (!) 54  Resp: 18 18  Temp: 36.7 C 36.7 C  SpO2: 95% 95%    Last Pain:  Vitals:   10/15/23 0559  TempSrc:   PainSc: 4                  Garmon Dehn S

## 2023-10-15 NOTE — Discharge Instructions (Addendum)
 Wound Care Keep incision covered and dry until post op day 3. You may remove the Honeycomb dressing on post op day 3. Leave steri-strips on back.  They will fall off by themselves. Do not put any creams, lotions, or ointments on incision. You are fine to shower. Let water run over incision and pat dry.   Activity Walk each and every day, increasing distance each day. No lifting greater than 8 lbs.  No lifting no bending no twisting    Diet Resume your normal diet.    Call Your Doctor If Any of These Occur Redness, drainage, or swelling at the wound.  Temperature greater than 101 degrees. Severe pain not relieved by pain medication. Incision starts to come apart.  Follow Up Appt Call [phone] if you have one or any problem.

## 2023-10-15 NOTE — Evaluation (Signed)
 Occupational Therapy Evaluation Patient Details Name: Leslie [name] MRN: [id number] DOB: Jun 11, 1951 Today's Date: 10/15/2023   History of Present Illness   Pt is a 73 y/o female who presents s/p L4-S1 laminectomy, bilateral medial facetectomy and foraminotomy on 10/14/2023. Pt with right greater than left LE pain and numbness, with progressive walking difficulty. Found to have severe lumbar stenosis. PMH significant for ADD, HTN, neuropathy bil feet, B TKR, LLE foot drop with AFO.     Clinical Impressions This 73 yo female admitted and underwent above presents to acute OT with PLOF of needing A for basic ADLs and doing limited household ambulation with RW at home with wheelchair following. She currently needs more A due to back precautions and use of brace. She will be discharged today so I am recommending follow up HHOT and we will D/C from acute OT. Dtr will be there to A her prn for the next week then paid A [place] (made pt and dtr aware she may need 24 hour care when she is gone--they can determine).     If plan is discharge home, recommend the following:   A lot of help with walking and/or transfers;A lot of help with bathing/dressing/bathroom;Assistance with cooking/housework;Help with stairs or ramp for entrance;Assist for transportation     Functional Status Assessment   Patient has had a recent decline in their functional status and demonstrates the ability to make significant improvements in function in a reasonable and predictable amount of time.     Equipment Recommendations   None recommended by OT      Precautions/Restrictions   Precautions Precautions: Fall;Back Precaution Booklet Issued: Yes (comment) Recall of Precautions/Restrictions: Impaired Precaution/Restrictions Comments: Required frequent cues for maintenance of precatuions. Reviewed with pt and daughter, Nehemiah Settle. Required Braces or Orthoses: Spinal Brace Spinal  Brace: Lumbar corset;Applied in sitting position Restrictions Weight Bearing Restrictions Per Provider Order: No     Mobility Bed Mobility Overal bed mobility: Needs Assistance Bed Mobility: Rolling, Sidelying to Sit Rolling: Contact guard assist Sidelying to sit: Min assist       General bed mobility comments: Light assist and hands on guarding to guide pt through the log roll technique. Initially sitting up into long sitting. Pt educated to maintain back precautions and pt returned to supine to practice log roll.    Transfers Overall transfer level: Needs assistance Equipment used: Rolling walker (2 wheels) Transfers: Sit to/from Stand, Bed to chair/wheelchair/BSC Sit to Stand: Mod assist, Min assist, +2 physical assistance, +2 safety/equipment, From elevated surface     Step pivot transfers: Min assist, +2 safety/equipment, +2 physical assistance     General transfer comment: VC's for hand placement on seated surface for safety. Pt with increased posterior lean with this technique, and required +2 mod assist to stand. Opted to let pt place B UE's on walker with therapist stabilizing walker. Pt was able to maintain improved posture and required decreased assistance to stand. Pt required hands on assist for balance, safety, and walker management for stepo pivot to chair.      Balance Overall balance assessment: Needs assistance Sitting-balance support: Feet supported, No upper extremity supported Sitting balance-Leahy Scale: Poor   Postural control: Posterior lean Standing balance support: Bilateral upper extremity supported, During functional activity, Reliant on assistive device for balance Standing balance-Leahy Scale: Poor  ADL either performed or assessed with clinical judgement   ADL Overall ADL's : Needs assistance/impaired Eating/Feeding: Independent Eating/Feeding Details (indicate cue type and reason): supported  sitting Grooming: Set up Grooming Details (indicate cue type and reason): supported sitting Upper Body Bathing: Minimal assistance Upper Body Bathing Details (indicate cue type and reason): supported sitting Lower Body Bathing: Total assistance Lower Body Bathing Details (indicate cue type and reason): Min A sit<>stand with letting her use RW for sit<>stand Upper Body Dressing : Moderate assistance Upper Body Dressing Details (indicate cue type and reason): supported sitting Lower Body Dressing: Total assistance Lower Body Dressing Details (indicate cue type and reason): Min A sit<>stand with letting her use RW for sit<>stand Toilet Transfer: Minimal assistance;Stand-pivot;BSC/3in1;Rolling walker (2 wheels)   Toileting- Clothing Manipulation and Hygiene: Total assistance Toileting - Clothing Manipulation Details (indicate cue type and reason): Min A sit<>stand with letting her use RW for sit<>stand        Educated on use of wet wipes for back peri care, use of 2 cups for brushing teeth, positioning in bed with pillows, alternating sit-standing-laying down throughout the day (but building up sitting tolerance from 20 minutes up to an hour, sequence of dressing.     Vision Baseline Vision/History: 1 Wears glasses Ability to See in Adequate Light: 0 Adequate Patient Visual Report: No change from baseline              Pertinent Vitals/Pain Pain Assessment Pain Assessment: Faces Faces Pain Scale: Hurts a little bit Pain Location: back Pain Descriptors / Indicators: Operative site guarding, Grimacing Pain Intervention(s): Limited activity within patient's tolerance, Monitored during session, Repositioned     Extremity/Trunk Assessment Upper Extremity Assessment Upper Extremity Assessment:  (back right shoulder with failed surgery, tremors)   Lower Extremity Assessment Lower Extremity Assessment: LLE deficits/detail LLE Deficits / Details: Pt got an AFO ~1.5 weeks ago per  daughter's report.   Cervical / Trunk Assessment Cervical / Trunk Assessment: Back Surgery   Communication Communication Communication: No apparent difficulties   Cognition Arousal: Alert Behavior During Therapy: WFL for tasks assessed/performed               OT - Cognition Comments: trouble recalling and following back precautions                         Cueing   Cueing Techniques: Verbal cues;Gestural cues;Tactile cues              Home Living Family/patient expects to be discharged to:: Private residence Living Arrangements: Alone;Children Available Help at Discharge: Family;Available 24 hours/day;Personal care attendant Type of Home: House Home Access: Stairs to enter [employer] of Steps: 1 Entrance Stairs-Rails: Right Home Layout: One level     Bathroom Shower/Tub: Chief Strategy Officer: Standard     Home Equipment: Tub Administrator (2 wheels);Wheelchair - Careers adviser (comment)          Prior Functioning/Environment Prior Level of Function : Needs assist             Mobility Comments: Per pt and daughter, pt could ambulate limited distances around the house for exercise. Pt's daughter would follow with the wheelchair for safety. Pt was mostly using the wheelchair for mobility and utilizing a BSC. ADLs Comments: Uses tub bench, daughter assists    OT Problem List: Decreased strength;Decreased range of motion;Impaired balance (sitting and/or standing);Pain;Obesity        OT Goals(Current goals can be found  in the care plan section)   Acute Rehab OT Goals Patient Stated Goal: to go home today      Co-evaluation PT/OT/SLP Co-Evaluation/Treatment: Yes Reason for Co-Treatment: Complexity of the patient's impairments (multi-system involvement);For patient/therapist safety;To address functional/ADL transfers PT goals addressed during session: Mobility/safety with mobility;Proper use of  DME;Strengthening/ROM;Balance OT goals addressed during session: ADL's and self-care;Strengthening/ROM      AM-PAC OT "6 Clicks" Daily Activity     Outcome Measure Help from another person eating meals?: None Help from another person taking care of personal grooming?: A Little Help from another person toileting, which includes using toliet, bedpan, or urinal?: A Lot Help from another person bathing (including washing, rinsing, drying)?: A Lot Help from another person to put on and taking off regular upper body clothing?: A Lot Help from another person to put on and taking off regular lower body clothing?: Total 6 Click Score: 14   End of Session Equipment Utilized During Treatment: Gait belt;Rolling walker (2 wheels);Back brace Nurse Communication: Mobility status (pt ready to go from therapy standpoint)  Activity Tolerance: Patient tolerated treatment well Patient left: in chair;with call bell/phone within reach;with family/visitor present  OT Visit Diagnosis: Unsteadiness on feet (R26.81);Other abnormalities of gait and mobility (R26.89);Muscle weakness (generalized) (M62.81);Pain Pain - part of body:  (back)                Time: 1026-1101 OT Time Calculation (min): 35 min Charges:  OT General Charges $OT Visit: 1 Visit OT Evaluation $OT Eval Moderate Complexity: 1 Mod  Cathy L. OT Acute Rehabilitation Services Office [phone]    Evette Georges 10/15/2023, 12:03 PM

## 2023-10-15 NOTE — Progress Notes (Signed)
 Orthopedic Tech Progress Note Patient Details:  Leslie [name] 1951/06/27 [id number] LSO was given to PT for application.  Ortho Devices Type of Ortho Device: Lumbar corsett Ortho Device/Splint Interventions: Ordered      [name] [name] 10/15/2023, 11:04 AM

## 2023-10-15 NOTE — Care Management Obs Status (Signed)
 MEDICARE OBSERVATION STATUS NOTIFICATION   Patient Details  Name: Leslie [name] MRN: [id number] Date of Birth: Nov 23, 1950   Medicare Observation Status Notification Given:  Yes    Kermit Balo, RN 10/15/2023, 10:25 AM

## 2023-10-15 NOTE — Discharge Summary (Signed)
 Patient ID: Leslie [name] MRN: [id number] DOB/AGE: [birth year] 73 y.o.  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 female who was admitted following an uncomplicated lumbar laminectomy. They were recovered in PACU and transferred to the floor. Hospital course was uncomplicated. Pt stable for discharge today. Pt to f/u in office for routine post op visit. Pt is in agreement w/ plan.    Discharge Exam: Blood pressure 101/61, pulse (!) 53, temperature 97.6 F (36.4 C), temperature source Oral, resp. rate 17, height 5\' 4"  (1.626 m), weight 85.7 kg, SpO2 99%. A&O Speech fluent, appropriate Strength/sensation grossly intact BUE/BLE.  Dressing c/d/I. Some dried drainage/blood to superior portion.   Disposition: Discharge disposition: 01-Home or Self Care       Discharge Instructions     Incentive spirometry RT   Complete by: As directed       Allergies as of 10/15/2023       Reactions   Amoxicillin Hives   Misc. Sulfonamide Containing Compounds    Nsaids    Due Gastric Bypass   Sulfamethoxazole Nausea And Vomiting   See patient list of med intolerances due to h/o gastric bypass and nephrectomy        Medication List     PAUSE taking these medications    Eliquis 5 MG Tabs tablet Wait to take this until: October 21, 2023 Generic drug: apixaban TAKE 1 TABLET(5 MG) BY MOUTH TWICE DAILY       TAKE these medications    ALPRAZolam 0.25 MG tablet Commonly known as: XANAX Take 1 tablet (0.25 mg total) by mouth at bedtime as needed for anxiety.   amphetamine-dextroamphetamine 30 MG 24 hr capsule Commonly known as: ADDERALL XR Take 1 capsule (30 mg total) by mouth daily.   amphetamine-dextroamphetamine 20 MG tablet Commonly known as:  Adderall Take 1 tablet (20 mg total) by mouth daily.   atorvastatin 40 MG tablet Commonly known as: LIPITOR Take 40 mg by mouth daily.   empagliflozin 10 MG Tabs tablet Commonly known as: Jardiance Take 1 tablet (10 mg total) by mouth daily before breakfast.   FLUoxetine 40 MG capsule Commonly known as: PROZAC TAKE 1 CAPSULE(40 MG) BY MOUTH EVERY MORNING   furosemide 40 MG tablet Commonly known as: LASIX Take 20 mg by mouth daily.   gabapentin 300 MG capsule Commonly known as: NEURONTIN TAKE 1 CAPSULE BY MOUTH THREE TIMES DAILY What changed:  how much to take how to take this when to take this additional instructions   lamoTRIgine 100 MG tablet Commonly known as: LAMICTAL TAKE 1 TABLET(100 MG) BY MOUTH TWICE DAILY   losartan 25 MG tablet Commonly known as: COZAAR TAKE 1/2 TABLET(12.5 MG) BY MOUTH DAILY What changed: See the new instructions.   melatonin 3 MG Tabs tablet Take 1 tablet (3 mg total) by mouth at bedtime.   methocarbamol 500 MG tablet Commonly known as: ROBAXIN Take 1 tablet (500 mg total) by mouth every 6 (six) hours as needed for muscle spasms.   metoprolol succinate 50 MG 24 hr tablet Commonly known as: TOPROL-XL Take 25 mg by mouth daily.   oxyCODONE 5 MG immediate release tablet Commonly known as: Oxy IR/ROXICODONE Take 1 tablet (5 mg total) by mouth every 4 (four) hours as needed for moderate pain (pain score 4-6).   polyethylene  glycol 17 g packet Commonly known as: MIRALAX / GLYCOLAX Take 17 g by mouth daily as needed.   potassium chloride 10 MEQ tablet Commonly known as: KLOR-CON M Take 10 mEq by mouth daily.   PreserVision AREDS 2 Caps Take 1 capsule by mouth 2 (two) times daily.   senna-docusate 8.6-50 MG tablet Commonly known as: Senokot-S Take 2 tablets by mouth 2 (two) times daily.   spironolactone 25 MG tablet Commonly known as: Aldactone Take 0.5 tablets (12.5 mg total) by mouth daily.   Vitamin D3 50 MCG (2000 UT)  Tabs Take 2,000 Units by mouth daily.   zolpidem 5 MG tablet Commonly known as: AMBIEN Take 1 tablet (5 mg total) by mouth at bedtime as needed for sleep. What changed: when to take this         Signed: Seydina Holliman CAYLIN [place] 10/15/2023, 9:28 AM

## 2023-10-18 DIAGNOSIS — E1151 Type 2 diabetes mellitus with diabetic peripheral angiopathy without gangrene: Secondary | ICD-10-CM | POA: Diagnosis not present

## 2023-10-18 DIAGNOSIS — E1122 Type 2 diabetes mellitus with diabetic chronic kidney disease: Secondary | ICD-10-CM | POA: Diagnosis not present

## 2023-10-18 DIAGNOSIS — D631 Anemia in chronic kidney disease: Secondary | ICD-10-CM | POA: Diagnosis not present

## 2023-10-18 DIAGNOSIS — E1140 Type 2 diabetes mellitus with diabetic neuropathy, unspecified: Secondary | ICD-10-CM | POA: Diagnosis not present

## 2023-10-18 DIAGNOSIS — I5022 Chronic systolic (congestive) heart failure: Secondary | ICD-10-CM | POA: Diagnosis not present

## 2023-10-18 DIAGNOSIS — N189 Chronic kidney disease, unspecified: Secondary | ICD-10-CM | POA: Diagnosis not present

## 2023-10-18 DIAGNOSIS — Z4789 Encounter for other orthopedic aftercare: Secondary | ICD-10-CM | POA: Diagnosis not present

## 2023-10-18 DIAGNOSIS — M48062 Spinal stenosis, lumbar region with neurogenic claudication: Secondary | ICD-10-CM | POA: Diagnosis not present

## 2023-10-18 DIAGNOSIS — I130 Hypertensive heart and chronic kidney disease with heart failure and stage 1 through stage 4 chronic kidney disease, or unspecified chronic kidney disease: Secondary | ICD-10-CM | POA: Diagnosis not present

## 2023-10-19 ENCOUNTER — Telehealth: Admitting: Physician Assistant

## 2023-10-19 DIAGNOSIS — B379 Candidiasis, unspecified: Secondary | ICD-10-CM | POA: Diagnosis not present

## 2023-10-19 DIAGNOSIS — N3000 Acute cystitis without hematuria: Secondary | ICD-10-CM

## 2023-10-19 DIAGNOSIS — T3695XA Adverse effect of unspecified systemic antibiotic, initial encounter: Secondary | ICD-10-CM | POA: Diagnosis not present

## 2023-10-19 MED ORDER — LEVOFLOXACIN 750 MG PO TABS: 750.0000 mg | ORAL_TABLET | Freq: Every day | ORAL | 0 refills | Status: AC

## 2023-10-19 MED ORDER — FLUCONAZOLE 150 MG PO TABS: 150.0000 mg | ORAL_TABLET | Freq: Once | ORAL | 0 refills | Status: AC

## 2023-10-19 NOTE — Progress Notes (Signed)
 Virtual Visit Consent   Leslie [name], you are scheduled for a virtual visit with a [facility] provider today. Just as with appointments in the office, your consent must be obtained to participate. Your consent will be active for this visit and any virtual visit you may have with one of our providers in the next 365 days. If you have a MyChart account, a copy of this consent can be sent to you electronically.  As this is a virtual visit, video technology does not allow for your provider to perform a traditional examination. This may limit your provider's ability to fully assess your condition. If your provider identifies any concerns that need to be evaluated in person or the need to arrange testing (such as labs, EKG, etc.), we will make arrangements to do so. Although advances in technology are sophisticated, we cannot ensure that it will always work on either your end or our end. If the connection with a video visit is poor, the visit may have to be switched to a telephone visit. With either a video or telephone visit, we are not always able to ensure that we have a secure connection.  By engaging in this virtual visit, you consent to the provision of healthcare and authorize for your insurance to be billed (if applicable) for the services provided during this visit. Depending on your insurance coverage, you may receive a charge related to this service.  I need to obtain your verbal consent now. Are you willing to proceed with your visit today? Leslie [name] has provided verbal consent on 10/19/2023 for a virtual visit (video or telephone). Roney Jaffe, PA-C  Date: 10/19/2023 3:33 PM   Virtual Visit via Video Note   I, Leslie [name], connected with  Leslie [name]  ([id number], [birth year]) on 10/19/23 at  3:30 PM EDT by a video-enabled telemedicine application and verified that I am speaking with the correct person using two identifiers.  Location: Patient: Virtual Visit Location  Patient: Home Provider: Virtual Visit Location Provider: Home Office   I discussed the limitations of evaluation and management by telemedicine and the availability of in person appointments. The patient expressed understanding and agreed to proceed.    History of Present Illness: Leslie [name] is a 73 y.o. who identifies as a female who was assigned female at birth, with a history of recent surgery, presents with hallucinations and abdominal pressure. She reports a positive at-home UTI test strip, with both leukocytes and nitrites present. The patient's daughter, who manages her medications, also notes that the patient had a catheter during her recent surgery, which could have contributed to the UTI. The patient has had a similar experience with a UTI in the past, with hallucinations and confusion as symptoms.   The patient also recently injured her foot, which has been swollen for three days. The injury occurred when the patient fell, and she has been able to put some weight on it for transfers. The pain has been decreasing, from an initial 7 to a current 4 or 5.  Results LABS - home UTI test Urinalysis: Positive for nitrites and leukocytes (10/19/2023)   Problems:  Patient Active Problem List   Diagnosis Date Noted   Lumbar spinal stenosis 10/14/2023   Frequent PVCs 07/21/2023   Hypercoagulable state due to persistent atrial fibrillation (HCC) 05/14/2023   Encounter for monitoring amiodarone therapy 05/14/2023   Persistent atrial fibrillation (HCC) 05/14/2023   Tylenol overdose 01/28/2023   Acetaminophen overdose 01/26/2023  Malnutrition of moderate degree 01/08/2023   Sacral fracture (HCC) 01/02/2023   Chronic HFrEF (heart failure with reduced ejection fraction) (HCC) 01/02/2023   AKI (acute kidney injury) (HCC) 01/02/2023   S/P gastric bypass 01/02/2023   Renal insufficiency 09/19/2022   Acute CHF (congestive heart failure) (HCC) 08/07/2022   Status post right knee  replacement 05/04/2021   Vitamin B12 deficiency 03/14/2021   Macrocytosis without anemia 03/14/2021   Vitamin D deficiency 02/17/2020   Sleep disorder 07/10/2018   ADHD (attention deficit hyperactivity disorder) 10/01/2017   Unilateral primary osteoarthritis, right knee 08/20/2017   Chronic pain of both shoulders 08/20/2017   Complete tear of right rotator cuff 04/29/2017   Osteoarthritis of left knee 06/16/2015   Status post total left knee replacement 06/16/2015   Attention deficit disorder 09/14/2007   History of colonic polyps 08/03/2007   Essential hypertension 01/20/2007   OBESITY NOS 12/04/2006   Allergic rhinitis 12/04/2006   GASTROJEJUNOSTOMY, HX OF 12/04/2006   Personal history presenting hazards to health 12/04/2006    Allergies:  Allergies  Allergen Reactions   Amoxicillin Hives   Misc. Sulfonamide Containing Compounds    Nsaids     Due Gastric Bypass   Sulfamethoxazole Nausea And Vomiting    See patient list of med intolerances due to h/o gastric bypass and nephrectomy   Medications:  Current Outpatient Medications:    fluconazole (DIFLUCAN) 150 MG tablet, Take 1 tablet (150 mg total) by mouth once for 1 dose., Disp: 1 tablet, Rfl: 0   levofloxacin (LEVAQUIN) 750 MG tablet, Take 1 tablet (750 mg total) by mouth daily for 5 days., Disp: 5 tablet, Rfl: 0   ALPRAZolam (XANAX) 0.25 MG tablet, Take 1 tablet (0.25 mg total) by mouth at bedtime as needed for anxiety., Disp: 30 tablet, Rfl: 0   amphetamine-dextroamphetamine (ADDERALL XR) 30 MG 24 hr capsule, Take 1 capsule (30 mg total) by mouth daily., Disp: 30 capsule, Rfl: 0   amphetamine-dextroamphetamine (ADDERALL) 20 MG tablet, Take 1 tablet (20 mg total) by mouth daily., Disp: 30 tablet, Rfl: 0   [Paused] apixaban (ELIQUIS) 5 MG TABS tablet, TAKE 1 TABLET(5 MG) BY MOUTH TWICE DAILY, Disp: 180 tablet, Rfl: 1   atorvastatin (LIPITOR) 40 MG tablet, Take 40 mg by mouth daily., Disp: , Rfl:    Cholecalciferol (VITAMIN D3)  50 MCG (2000 UT) TABS, Take 2,000 Units by mouth daily., Disp: , Rfl:    empagliflozin (JARDIANCE) 10 MG TABS tablet, Take 1 tablet (10 mg total) by mouth daily before breakfast., Disp: 90 tablet, Rfl: 3   FLUoxetine (PROZAC) 40 MG capsule, TAKE 1 CAPSULE(40 MG) BY MOUTH EVERY MORNING, Disp: 90 capsule, Rfl: 1   furosemide (LASIX) 40 MG tablet, Take 20 mg by mouth daily., Disp: , Rfl:    gabapentin (NEURONTIN) 300 MG capsule, TAKE 1 CAPSULE BY MOUTH THREE TIMES DAILY (Patient taking differently: Take 300-600 mg by mouth See admin instructions. Take one  capsule in the morning, one capsule in the afternoon, and two capsules in the evening.), Disp: 90 capsule, Rfl: 2   lamoTRIgine (LAMICTAL) 100 MG tablet, TAKE 1 TABLET(100 MG) BY MOUTH TWICE DAILY, Disp: 180 tablet, Rfl: 1   losartan (COZAAR) 25 MG tablet, TAKE 1/2 TABLET(12.5 MG) BY MOUTH DAILY (Patient taking differently: Take 25 mg by mouth daily.), Disp: 45 tablet, Rfl: 3   melatonin 3 MG TABS tablet, Take 1 tablet (3 mg total) by mouth at bedtime., Disp: , Rfl: 0   methocarbamol (ROBAXIN) 500 MG tablet,  Take 1 tablet (500 mg total) by mouth every 6 (six) hours as needed for muscle spasms., Disp: 120 tablet, Rfl: 1   metoprolol succinate (TOPROL-XL) 50 MG 24 hr tablet, Take 25 mg by mouth daily., Disp: , Rfl:    Multiple Vitamins-Minerals (PRESERVISION AREDS 2) CAPS, Take 1 capsule by mouth 2 (two) times daily., Disp: , Rfl:    oxyCODONE (OXY IR/ROXICODONE) 5 MG immediate release tablet, Take 1 tablet (5 mg total) by mouth every 4 (four) hours as needed for moderate pain (pain score 4-6)., Disp: 30 tablet, Rfl: 0   polyethylene glycol (MIRALAX / GLYCOLAX) 17 g packet, Take 17 g by mouth daily as needed. (Patient not taking: Reported on 10/02/2023), Disp: 14 each, Rfl: 0   potassium chloride (KLOR-CON M) 10 MEQ tablet, Take 10 mEq by mouth daily., Disp: , Rfl:    senna-docusate (SENOKOT-S) 8.6-50 MG tablet, Take 2 tablets by mouth 2 (two) times daily.  (Patient not taking: Reported on 10/02/2023), Disp: , Rfl:    spironolactone (ALDACTONE) 25 MG tablet, Take 0.5 tablets (12.5 mg total) by mouth daily., Disp: 30 tablet, Rfl: 5   zolpidem (AMBIEN) 5 MG tablet, Take 1 tablet (5 mg total) by mouth at bedtime as needed for sleep. (Patient taking differently: Take 5 mg by mouth at bedtime.), Disp: 30 tablet, Rfl: 2  Observations/Objective: Patient is well-developed, well-nourished in no acute distress.  Resting comfortably  at home.  Head is normocephalic, atraumatic.  No labored breathing.  Speech is clear and coherent with logical content.  Patient is alert and oriented at baseline.    Assessment and Plan: 1. Acute cystitis without hematuria (Primary) - levofloxacin (LEVAQUIN) 750 MG tablet; Take 1 tablet (750 mg total) by mouth daily for 5 days.  Dispense: 5 tablet; Refill: 0  2. Antibiotic-induced yeast infection - fluconazole (DIFLUCAN) 150 MG tablet; Take 1 tablet (150 mg total) by mouth once for 1 dose.  Dispense: 1 tablet; Refill: 0  Symptoms consistent with a UTI, including abdominal pressure and hallucinations, exacerbated by recent surgery and catheter use. Positive at-home UTI test strip for nitrates and leukocytes confirms diagnosis. Allergy to amoxicillin and intolerance to sulfa drugs limit antibiotic options.  - Prescribe Levaquin 750 mg once daily for 5 days. - Encourage increased fluid intake. - Recommend yogurt or probiotics to prevent C. diff overgrowth. - Prescribe Diflucan for potential yeast infection.  Foot Injury Right foot injury from a fall, with swelling and pain. Pain improved to 4-5/10, with partial weight-bearing ability.  - Recommend evaluation at Urgent Care   Follow Up Instructions: I discussed the assessment and treatment plan with the patient. The patient was provided an opportunity to ask questions and all were answered. The patient agreed with the plan and demonstrated an understanding of the  instructions.  A copy of instructions were sent to the patient via MyChart unless otherwise noted below.     The patient was advised to call back or seek an in-person evaluation if the symptoms worsen or if the condition fails to improve as anticipated.    Leslie Knudsen Mayers, PA-C

## 2023-10-19 NOTE — Patient Instructions (Signed)
 Leslie [name], thank you for joining Roney Jaffe, PA-C for today's virtual visit.  While this provider is not your primary care provider (PCP), if your PCP is located in our provider database this encounter information will be shared with them immediately following your visit.   A [facility] MyChart account gives you access to today's visit and all your visits, tests, and labs performed at [facility] " click here if you don't have a [facility] MyChart account or go to [email or web address]  Consent: (Patient) Leslie [name] provided verbal consent for this virtual visit at the beginning of the encounter.  Current Medications:  Current Outpatient Medications:    fluconazole (DIFLUCAN) 150 MG tablet, Take 1 tablet (150 mg total) by mouth once for 1 dose., Disp: 1 tablet, Rfl: 0   levofloxacin (LEVAQUIN) 750 MG tablet, Take 1 tablet (750 mg total) by mouth daily for 5 days., Disp: 5 tablet, Rfl: 0   ALPRAZolam (XANAX) 0.25 MG tablet, Take 1 tablet (0.25 mg total) by mouth at bedtime as needed for anxiety., Disp: 30 tablet, Rfl: 0   amphetamine-dextroamphetamine (ADDERALL XR) 30 MG 24 hr capsule, Take 1 capsule (30 mg total) by mouth daily., Disp: 30 capsule, Rfl: 0   amphetamine-dextroamphetamine (ADDERALL) 20 MG tablet, Take 1 tablet (20 mg total) by mouth daily., Disp: 30 tablet, Rfl: 0   [Paused] apixaban (ELIQUIS) 5 MG TABS tablet, TAKE 1 TABLET(5 MG) BY MOUTH TWICE DAILY, Disp: 180 tablet, Rfl: 1   atorvastatin (LIPITOR) 40 MG tablet, Take 40 mg by mouth daily., Disp: , Rfl:    Cholecalciferol (VITAMIN D3) 50 MCG (2000 UT) TABS, Take 2,000 Units by mouth daily., Disp: , Rfl:    empagliflozin (JARDIANCE) 10 MG TABS tablet, Take 1 tablet (10 mg total) by mouth daily before breakfast., Disp: 90 tablet, Rfl: 3   FLUoxetine (PROZAC) 40 MG capsule, TAKE 1 CAPSULE(40 MG) BY MOUTH EVERY MORNING, Disp: 90 capsule, Rfl: 1   furosemide (LASIX) 40 MG tablet, Take 20 mg by  mouth daily., Disp: , Rfl:    gabapentin (NEURONTIN) 300 MG capsule, TAKE 1 CAPSULE BY MOUTH THREE TIMES DAILY (Patient taking differently: Take 300-600 mg by mouth See admin instructions. Take one  capsule in the morning, one capsule in the afternoon, and two capsules in the evening.), Disp: 90 capsule, Rfl: 2   lamoTRIgine (LAMICTAL) 100 MG tablet, TAKE 1 TABLET(100 MG) BY MOUTH TWICE DAILY, Disp: 180 tablet, Rfl: 1   losartan (COZAAR) 25 MG tablet, TAKE 1/2 TABLET(12.5 MG) BY MOUTH DAILY (Patient taking differently: Take 25 mg by mouth daily.), Disp: 45 tablet, Rfl: 3   melatonin 3 MG TABS tablet, Take 1 tablet (3 mg total) by mouth at bedtime., Disp: , Rfl: 0   methocarbamol (ROBAXIN) 500 MG tablet, Take 1 tablet (500 mg total) by mouth every 6 (six) hours as needed for muscle spasms., Disp: 120 tablet, Rfl: 1   metoprolol succinate (TOPROL-XL) 50 MG 24 hr tablet, Take 25 mg by mouth daily., Disp: , Rfl:    Multiple Vitamins-Minerals (PRESERVISION AREDS 2) CAPS, Take 1 capsule by mouth 2 (two) times daily., Disp: , Rfl:    oxyCODONE (OXY IR/ROXICODONE) 5 MG immediate release tablet, Take 1 tablet (5 mg total) by mouth every 4 (four) hours as needed for moderate pain (pain score 4-6)., Disp: 30 tablet, Rfl: 0   polyethylene glycol (MIRALAX / GLYCOLAX) 17 g packet, Take 17 g by mouth daily as needed. (Patient not taking: Reported on  10/02/2023), Disp: 14 each, Rfl: 0   potassium chloride (KLOR-CON M) 10 MEQ tablet, Take 10 mEq by mouth daily., Disp: , Rfl:    senna-docusate (SENOKOT-S) 8.6-50 MG tablet, Take 2 tablets by mouth 2 (two) times daily. (Patient not taking: Reported on 10/02/2023), Disp: , Rfl:    spironolactone (ALDACTONE) 25 MG tablet, Take 0.5 tablets (12.5 mg total) by mouth daily., Disp: 30 tablet, Rfl: 5   zolpidem (AMBIEN) 5 MG tablet, Take 1 tablet (5 mg total) by mouth at bedtime as needed for sleep. (Patient taking differently: Take 5 mg by mouth at bedtime.), Disp: 30 tablet, Rfl:  2   Medications ordered in this encounter:  Meds ordered this encounter  Medications   levofloxacin (LEVAQUIN) 750 MG tablet    Sig: Take 1 tablet (750 mg total) by mouth daily for 5 days.    Dispense:  5 tablet    Refill:  0    Supervising Provider:   Merrilee Jansky [[id number]]   fluconazole (DIFLUCAN) 150 MG tablet    Sig: Take 1 tablet (150 mg total) by mouth once for 1 dose.    Dispense:  1 tablet    Refill:  0    Supervising Provider:   Merrilee Jansky [[id number]]     *If you need refills on other medications prior to your next appointment, please contact your pharmacy*  Follow-Up: Call back or seek an in-person evaluation if the symptoms worsen or if the condition fails to improve as anticipated.  [facility] [phone]  Other Instructions Urinary Tract Infection, Female A urinary tract infection (UTI) is an infection in your urinary tract. The urinary tract is made up of organs that make, store, and get rid of pee (urine) in your body. These organs include: The kidneys. The ureters. The bladder. The urethra. What are the causes? Most UTIs are caused by germs called bacteria. They may be in or near your genitals. These germs grow and cause swelling in your urinary tract. What increases the risk? You're more likely to get a UTI if: You're a female. The urethra is shorter in females than in males. You have a soft tube called a catheter that drains your pee. You can't control when you pee or poop. You have trouble peeing because of: A kidney stone. A urinary blockage. A nerve condition that affects your bladder. Not getting enough to drink. You're sexually active. You use a birth control inside your vagina, like spermicide. You're pregnant. You have low levels of the hormone estrogen in your body. You're an older adult. You're also more likely to get a UTI if you have other health problems. These may include: Diabetes. A weak immune system. Your  immune system is your body's defense system. Sickle cell disease. Injury of the spine. What are the signs or symptoms? Symptoms may include: Needing to pee right away. Peeing small amounts often. Pain or burning when you pee. Blood in your pee. Pee that smells bad or odd. Pain in your belly or lower back. You may also: Feel confused. This may be the first symptom in older adults. Vomit. Not feel hungry. Feel tired or easily annoyed. Have a fever or chills. How is this diagnosed? A UTI is diagnosed based on your medical history and an exam. You may also have other tests. These may include: Pee tests. Blood tests. Tests for sexually transmitted infections (STIs). If you've had more than one UTI, you may need to have imaging studies  done to find out why you keep getting them. How is this treated? A UTI can be treated by: Taking antibiotics or other medicines. Drinking enough fluid to keep your pee pale yellow. In rare cases, a UTI can cause a very bad condition called sepsis. Sepsis may be treated in the hospital. Follow these instructions at home: Medicines Take your medicines only as told by your health care provider. If you were given antibiotics, take them as told by your provider. Do not stop taking them even if you start to feel better. General instructions Make sure you: Pee often and fully. Do not hold your pee for a long time. Wipe from front to back after you pee or poop. Use each tissue only once when you wipe. Pee after you have sex. Do not douche or use sprays or powders in your genital area. Contact a health care provider if: Your symptoms don't get better after 1-2 days of taking antibiotics. Your symptoms go away and then come back. You have a fever or chills. You vomit or feel like you may vomit. Get help right away if: You have very bad pain in your back or lower belly. You faint. This information is not intended to replace advice given to you by your  health care provider. Make sure you discuss any questions you have with your health care provider. Document Revised: 02/13/2023 Document Reviewed: 10/11/2022 Elsevier Patient Education  2024 [employer]   If you have been instructed to have an in-person evaluation today at a local Urgent Care facility, please use the link below. It will take you to a list of all of our available [facility], including address, phone number and hours of operation. Please do not delay care.  [facility]  If you or a family member do not have a primary care provider, use the link below to schedule a visit and establish care. When you choose a [facility] primary care physician or advanced practice provider, you gain a long-term partner in health. Find a Primary Care Provider  Learn more about [facility]'s in-office and virtual care options:  - Get Care Now

## 2023-10-20 ENCOUNTER — Encounter: Payer: Self-pay | Admitting: Internal Medicine

## 2023-10-20 ENCOUNTER — Other Ambulatory Visit: Payer: Self-pay | Admitting: Internal Medicine

## 2023-10-20 DIAGNOSIS — Z4789 Encounter for other orthopedic aftercare: Secondary | ICD-10-CM | POA: Diagnosis not present

## 2023-10-20 DIAGNOSIS — G479 Sleep disorder, unspecified: Secondary | ICD-10-CM

## 2023-10-20 DIAGNOSIS — I130 Hypertensive heart and chronic kidney disease with heart failure and stage 1 through stage 4 chronic kidney disease, or unspecified chronic kidney disease: Secondary | ICD-10-CM | POA: Diagnosis not present

## 2023-10-20 DIAGNOSIS — E1140 Type 2 diabetes mellitus with diabetic neuropathy, unspecified: Secondary | ICD-10-CM | POA: Diagnosis not present

## 2023-10-20 DIAGNOSIS — I5022 Chronic systolic (congestive) heart failure: Secondary | ICD-10-CM | POA: Diagnosis not present

## 2023-10-20 DIAGNOSIS — F900 Attention-deficit hyperactivity disorder, predominantly inattentive type: Secondary | ICD-10-CM

## 2023-10-20 DIAGNOSIS — E1151 Type 2 diabetes mellitus with diabetic peripheral angiopathy without gangrene: Secondary | ICD-10-CM | POA: Diagnosis not present

## 2023-10-20 DIAGNOSIS — N189 Chronic kidney disease, unspecified: Secondary | ICD-10-CM | POA: Diagnosis not present

## 2023-10-20 DIAGNOSIS — M48062 Spinal stenosis, lumbar region with neurogenic claudication: Secondary | ICD-10-CM | POA: Diagnosis not present

## 2023-10-20 DIAGNOSIS — D631 Anemia in chronic kidney disease: Secondary | ICD-10-CM | POA: Diagnosis not present

## 2023-10-20 DIAGNOSIS — E1122 Type 2 diabetes mellitus with diabetic chronic kidney disease: Secondary | ICD-10-CM | POA: Diagnosis not present

## 2023-10-20 MED ORDER — AMPHETAMINE-DEXTROAMPHET ER 30 MG PO CP24
30.0000 mg | ORAL_CAPSULE | Freq: Every day | ORAL | 0 refills | Status: DC
Start: 2023-10-20 — End: 2024-01-29

## 2023-10-20 MED ORDER — AMPHETAMINE-DEXTROAMPHETAMINE 20 MG PO TABS: 20.0000 mg | ORAL_TABLET | Freq: Every day | ORAL | 0 refills | Status: DC

## 2023-10-20 MED ORDER — ALPRAZOLAM 0.25 MG PO TABS: 0.2500 mg | ORAL_TABLET | Freq: Every evening | ORAL | 0 refills | Status: DC | PRN

## 2023-10-20 MED ORDER — AMPHETAMINE-DEXTROAMPHET ER 30 MG PO CP24
30.0000 mg | ORAL_CAPSULE | Freq: Every day | ORAL | 0 refills | Status: DC
Start: 2023-10-20 — End: 2024-01-19

## 2023-10-20 MED ORDER — AMPHETAMINE-DEXTROAMPHET ER 30 MG PO CP24: 30.0000 mg | ORAL_CAPSULE | Freq: Every day | ORAL | 0 refills | Status: DC

## 2023-10-20 NOTE — Telephone Encounter (Signed)
 Last office visit was 07/22/23

## 2023-10-21 ENCOUNTER — Ambulatory Visit: Admitting: Neurology

## 2023-10-25 ENCOUNTER — Other Ambulatory Visit: Payer: Self-pay | Admitting: Internal Medicine

## 2023-10-25 DIAGNOSIS — G479 Sleep disorder, unspecified: Secondary | ICD-10-CM

## 2023-10-28 DIAGNOSIS — M48062 Spinal stenosis, lumbar region with neurogenic claudication: Secondary | ICD-10-CM | POA: Diagnosis not present

## 2023-10-28 DIAGNOSIS — E1151 Type 2 diabetes mellitus with diabetic peripheral angiopathy without gangrene: Secondary | ICD-10-CM | POA: Diagnosis not present

## 2023-10-28 DIAGNOSIS — I130 Hypertensive heart and chronic kidney disease with heart failure and stage 1 through stage 4 chronic kidney disease, or unspecified chronic kidney disease: Secondary | ICD-10-CM | POA: Diagnosis not present

## 2023-10-28 DIAGNOSIS — N189 Chronic kidney disease, unspecified: Secondary | ICD-10-CM | POA: Diagnosis not present

## 2023-10-28 DIAGNOSIS — I5022 Chronic systolic (congestive) heart failure: Secondary | ICD-10-CM | POA: Diagnosis not present

## 2023-10-28 DIAGNOSIS — E1122 Type 2 diabetes mellitus with diabetic chronic kidney disease: Secondary | ICD-10-CM | POA: Diagnosis not present

## 2023-10-28 DIAGNOSIS — E1140 Type 2 diabetes mellitus with diabetic neuropathy, unspecified: Secondary | ICD-10-CM | POA: Diagnosis not present

## 2023-10-28 DIAGNOSIS — D631 Anemia in chronic kidney disease: Secondary | ICD-10-CM | POA: Diagnosis not present

## 2023-10-28 DIAGNOSIS — Z4789 Encounter for other orthopedic aftercare: Secondary | ICD-10-CM | POA: Diagnosis not present

## 2023-10-29 DIAGNOSIS — D631 Anemia in chronic kidney disease: Secondary | ICD-10-CM | POA: Diagnosis not present

## 2023-10-29 DIAGNOSIS — E1122 Type 2 diabetes mellitus with diabetic chronic kidney disease: Secondary | ICD-10-CM | POA: Diagnosis not present

## 2023-10-29 DIAGNOSIS — E1140 Type 2 diabetes mellitus with diabetic neuropathy, unspecified: Secondary | ICD-10-CM | POA: Diagnosis not present

## 2023-10-29 DIAGNOSIS — N189 Chronic kidney disease, unspecified: Secondary | ICD-10-CM | POA: Diagnosis not present

## 2023-10-29 DIAGNOSIS — I5022 Chronic systolic (congestive) heart failure: Secondary | ICD-10-CM | POA: Diagnosis not present

## 2023-10-29 DIAGNOSIS — E1151 Type 2 diabetes mellitus with diabetic peripheral angiopathy without gangrene: Secondary | ICD-10-CM | POA: Diagnosis not present

## 2023-10-29 DIAGNOSIS — Z4789 Encounter for other orthopedic aftercare: Secondary | ICD-10-CM | POA: Diagnosis not present

## 2023-10-29 DIAGNOSIS — I130 Hypertensive heart and chronic kidney disease with heart failure and stage 1 through stage 4 chronic kidney disease, or unspecified chronic kidney disease: Secondary | ICD-10-CM | POA: Diagnosis not present

## 2023-10-29 DIAGNOSIS — M48062 Spinal stenosis, lumbar region with neurogenic claudication: Secondary | ICD-10-CM | POA: Diagnosis not present

## 2023-11-04 DIAGNOSIS — D631 Anemia in chronic kidney disease: Secondary | ICD-10-CM | POA: Diagnosis not present

## 2023-11-04 DIAGNOSIS — E1151 Type 2 diabetes mellitus with diabetic peripheral angiopathy without gangrene: Secondary | ICD-10-CM | POA: Diagnosis not present

## 2023-11-04 DIAGNOSIS — N189 Chronic kidney disease, unspecified: Secondary | ICD-10-CM | POA: Diagnosis not present

## 2023-11-04 DIAGNOSIS — I130 Hypertensive heart and chronic kidney disease with heart failure and stage 1 through stage 4 chronic kidney disease, or unspecified chronic kidney disease: Secondary | ICD-10-CM | POA: Diagnosis not present

## 2023-11-04 DIAGNOSIS — M48062 Spinal stenosis, lumbar region with neurogenic claudication: Secondary | ICD-10-CM | POA: Diagnosis not present

## 2023-11-04 DIAGNOSIS — E1140 Type 2 diabetes mellitus with diabetic neuropathy, unspecified: Secondary | ICD-10-CM | POA: Diagnosis not present

## 2023-11-04 DIAGNOSIS — E1122 Type 2 diabetes mellitus with diabetic chronic kidney disease: Secondary | ICD-10-CM | POA: Diagnosis not present

## 2023-11-04 DIAGNOSIS — I5022 Chronic systolic (congestive) heart failure: Secondary | ICD-10-CM | POA: Diagnosis not present

## 2023-11-04 DIAGNOSIS — Z4789 Encounter for other orthopedic aftercare: Secondary | ICD-10-CM | POA: Diagnosis not present

## 2023-11-05 DIAGNOSIS — I130 Hypertensive heart and chronic kidney disease with heart failure and stage 1 through stage 4 chronic kidney disease, or unspecified chronic kidney disease: Secondary | ICD-10-CM | POA: Diagnosis not present

## 2023-11-05 DIAGNOSIS — E1122 Type 2 diabetes mellitus with diabetic chronic kidney disease: Secondary | ICD-10-CM | POA: Diagnosis not present

## 2023-11-05 DIAGNOSIS — D631 Anemia in chronic kidney disease: Secondary | ICD-10-CM | POA: Diagnosis not present

## 2023-11-05 DIAGNOSIS — Z4789 Encounter for other orthopedic aftercare: Secondary | ICD-10-CM | POA: Diagnosis not present

## 2023-11-05 DIAGNOSIS — I5022 Chronic systolic (congestive) heart failure: Secondary | ICD-10-CM | POA: Diagnosis not present

## 2023-11-05 DIAGNOSIS — E1140 Type 2 diabetes mellitus with diabetic neuropathy, unspecified: Secondary | ICD-10-CM | POA: Diagnosis not present

## 2023-11-05 DIAGNOSIS — N189 Chronic kidney disease, unspecified: Secondary | ICD-10-CM | POA: Diagnosis not present

## 2023-11-05 DIAGNOSIS — M48062 Spinal stenosis, lumbar region with neurogenic claudication: Secondary | ICD-10-CM | POA: Diagnosis not present

## 2023-11-05 DIAGNOSIS — E1151 Type 2 diabetes mellitus with diabetic peripheral angiopathy without gangrene: Secondary | ICD-10-CM | POA: Diagnosis not present

## 2023-11-10 DIAGNOSIS — E1140 Type 2 diabetes mellitus with diabetic neuropathy, unspecified: Secondary | ICD-10-CM | POA: Diagnosis not present

## 2023-11-10 DIAGNOSIS — E1151 Type 2 diabetes mellitus with diabetic peripheral angiopathy without gangrene: Secondary | ICD-10-CM | POA: Diagnosis not present

## 2023-11-10 DIAGNOSIS — N189 Chronic kidney disease, unspecified: Secondary | ICD-10-CM | POA: Diagnosis not present

## 2023-11-10 DIAGNOSIS — I130 Hypertensive heart and chronic kidney disease with heart failure and stage 1 through stage 4 chronic kidney disease, or unspecified chronic kidney disease: Secondary | ICD-10-CM | POA: Diagnosis not present

## 2023-11-10 DIAGNOSIS — M48062 Spinal stenosis, lumbar region with neurogenic claudication: Secondary | ICD-10-CM | POA: Diagnosis not present

## 2023-11-10 DIAGNOSIS — E1122 Type 2 diabetes mellitus with diabetic chronic kidney disease: Secondary | ICD-10-CM | POA: Diagnosis not present

## 2023-11-10 DIAGNOSIS — Z4789 Encounter for other orthopedic aftercare: Secondary | ICD-10-CM | POA: Diagnosis not present

## 2023-11-10 DIAGNOSIS — D631 Anemia in chronic kidney disease: Secondary | ICD-10-CM | POA: Diagnosis not present

## 2023-11-10 DIAGNOSIS — I5022 Chronic systolic (congestive) heart failure: Secondary | ICD-10-CM | POA: Diagnosis not present

## 2023-11-13 DIAGNOSIS — R262 Difficulty in walking, not elsewhere classified: Secondary | ICD-10-CM | POA: Diagnosis not present

## 2023-11-17 DIAGNOSIS — I130 Hypertensive heart and chronic kidney disease with heart failure and stage 1 through stage 4 chronic kidney disease, or unspecified chronic kidney disease: Secondary | ICD-10-CM | POA: Diagnosis not present

## 2023-11-17 DIAGNOSIS — D631 Anemia in chronic kidney disease: Secondary | ICD-10-CM | POA: Diagnosis not present

## 2023-11-17 DIAGNOSIS — E1151 Type 2 diabetes mellitus with diabetic peripheral angiopathy without gangrene: Secondary | ICD-10-CM | POA: Diagnosis not present

## 2023-11-17 DIAGNOSIS — E1122 Type 2 diabetes mellitus with diabetic chronic kidney disease: Secondary | ICD-10-CM | POA: Diagnosis not present

## 2023-11-17 DIAGNOSIS — Z4789 Encounter for other orthopedic aftercare: Secondary | ICD-10-CM | POA: Diagnosis not present

## 2023-11-17 DIAGNOSIS — N189 Chronic kidney disease, unspecified: Secondary | ICD-10-CM | POA: Diagnosis not present

## 2023-11-17 DIAGNOSIS — E1140 Type 2 diabetes mellitus with diabetic neuropathy, unspecified: Secondary | ICD-10-CM | POA: Diagnosis not present

## 2023-11-17 DIAGNOSIS — M48062 Spinal stenosis, lumbar region with neurogenic claudication: Secondary | ICD-10-CM | POA: Diagnosis not present

## 2023-11-17 DIAGNOSIS — I5022 Chronic systolic (congestive) heart failure: Secondary | ICD-10-CM | POA: Diagnosis not present

## 2023-11-18 ENCOUNTER — Ambulatory Visit: Admitting: Internal Medicine

## 2023-11-18 ENCOUNTER — Ambulatory Visit: Admitting: Neurology

## 2023-11-18 DIAGNOSIS — M48062 Spinal stenosis, lumbar region with neurogenic claudication: Secondary | ICD-10-CM | POA: Diagnosis not present

## 2023-11-18 DIAGNOSIS — E1140 Type 2 diabetes mellitus with diabetic neuropathy, unspecified: Secondary | ICD-10-CM | POA: Diagnosis not present

## 2023-11-18 DIAGNOSIS — Z4789 Encounter for other orthopedic aftercare: Secondary | ICD-10-CM | POA: Diagnosis not present

## 2023-11-18 DIAGNOSIS — D631 Anemia in chronic kidney disease: Secondary | ICD-10-CM | POA: Diagnosis not present

## 2023-11-18 DIAGNOSIS — I5022 Chronic systolic (congestive) heart failure: Secondary | ICD-10-CM | POA: Diagnosis not present

## 2023-11-18 DIAGNOSIS — E1122 Type 2 diabetes mellitus with diabetic chronic kidney disease: Secondary | ICD-10-CM | POA: Diagnosis not present

## 2023-11-18 DIAGNOSIS — N189 Chronic kidney disease, unspecified: Secondary | ICD-10-CM | POA: Diagnosis not present

## 2023-11-18 DIAGNOSIS — E1151 Type 2 diabetes mellitus with diabetic peripheral angiopathy without gangrene: Secondary | ICD-10-CM | POA: Diagnosis not present

## 2023-11-18 DIAGNOSIS — I130 Hypertensive heart and chronic kidney disease with heart failure and stage 1 through stage 4 chronic kidney disease, or unspecified chronic kidney disease: Secondary | ICD-10-CM | POA: Diagnosis not present

## 2023-11-19 ENCOUNTER — Ambulatory Visit: Admitting: Internal Medicine

## 2023-11-20 ENCOUNTER — Ambulatory Visit: Admitting: Internal Medicine

## 2023-11-24 ENCOUNTER — Other Ambulatory Visit: Payer: Self-pay | Admitting: Internal Medicine

## 2023-11-24 DIAGNOSIS — I130 Hypertensive heart and chronic kidney disease with heart failure and stage 1 through stage 4 chronic kidney disease, or unspecified chronic kidney disease: Secondary | ICD-10-CM | POA: Diagnosis not present

## 2023-11-24 DIAGNOSIS — I5022 Chronic systolic (congestive) heart failure: Secondary | ICD-10-CM | POA: Diagnosis not present

## 2023-11-24 DIAGNOSIS — Z4789 Encounter for other orthopedic aftercare: Secondary | ICD-10-CM | POA: Diagnosis not present

## 2023-11-24 DIAGNOSIS — E1140 Type 2 diabetes mellitus with diabetic neuropathy, unspecified: Secondary | ICD-10-CM | POA: Diagnosis not present

## 2023-11-24 DIAGNOSIS — N189 Chronic kidney disease, unspecified: Secondary | ICD-10-CM | POA: Diagnosis not present

## 2023-11-24 DIAGNOSIS — G479 Sleep disorder, unspecified: Secondary | ICD-10-CM

## 2023-11-24 DIAGNOSIS — M48062 Spinal stenosis, lumbar region with neurogenic claudication: Secondary | ICD-10-CM | POA: Diagnosis not present

## 2023-11-24 DIAGNOSIS — E1151 Type 2 diabetes mellitus with diabetic peripheral angiopathy without gangrene: Secondary | ICD-10-CM | POA: Diagnosis not present

## 2023-11-24 DIAGNOSIS — E1122 Type 2 diabetes mellitus with diabetic chronic kidney disease: Secondary | ICD-10-CM | POA: Diagnosis not present

## 2023-11-24 DIAGNOSIS — D631 Anemia in chronic kidney disease: Secondary | ICD-10-CM | POA: Diagnosis not present

## 2023-11-25 DIAGNOSIS — Z4789 Encounter for other orthopedic aftercare: Secondary | ICD-10-CM | POA: Diagnosis not present

## 2023-11-25 DIAGNOSIS — E1140 Type 2 diabetes mellitus with diabetic neuropathy, unspecified: Secondary | ICD-10-CM | POA: Diagnosis not present

## 2023-11-25 DIAGNOSIS — E1122 Type 2 diabetes mellitus with diabetic chronic kidney disease: Secondary | ICD-10-CM | POA: Diagnosis not present

## 2023-11-25 DIAGNOSIS — E1151 Type 2 diabetes mellitus with diabetic peripheral angiopathy without gangrene: Secondary | ICD-10-CM | POA: Diagnosis not present

## 2023-11-25 DIAGNOSIS — M48062 Spinal stenosis, lumbar region with neurogenic claudication: Secondary | ICD-10-CM | POA: Diagnosis not present

## 2023-11-25 DIAGNOSIS — D631 Anemia in chronic kidney disease: Secondary | ICD-10-CM | POA: Diagnosis not present

## 2023-11-25 DIAGNOSIS — I5022 Chronic systolic (congestive) heart failure: Secondary | ICD-10-CM | POA: Diagnosis not present

## 2023-11-25 DIAGNOSIS — I130 Hypertensive heart and chronic kidney disease with heart failure and stage 1 through stage 4 chronic kidney disease, or unspecified chronic kidney disease: Secondary | ICD-10-CM | POA: Diagnosis not present

## 2023-11-25 DIAGNOSIS — N189 Chronic kidney disease, unspecified: Secondary | ICD-10-CM | POA: Diagnosis not present

## 2023-11-26 ENCOUNTER — Ambulatory Visit: Admitting: Internal Medicine

## 2023-11-27 ENCOUNTER — Ambulatory Visit (INDEPENDENT_AMBULATORY_CARE_PROVIDER_SITE_OTHER): Admitting: Internal Medicine

## 2023-11-27 ENCOUNTER — Encounter: Payer: Self-pay | Admitting: Internal Medicine

## 2023-11-27 DIAGNOSIS — Z6832 Body mass index (BMI) 32.0-32.9, adult: Secondary | ICD-10-CM | POA: Diagnosis not present

## 2023-11-27 DIAGNOSIS — I5022 Chronic systolic (congestive) heart failure: Secondary | ICD-10-CM

## 2023-11-27 MED ORDER — WEGOVY 0.25 MG/0.5ML ~~LOC~~ SOAJ
0.2500 mg | SUBCUTANEOUS | 0 refills | Status: AC
Start: 2023-11-27 — End: ?

## 2023-11-28 ENCOUNTER — Telehealth: Payer: Self-pay

## 2023-11-28 ENCOUNTER — Other Ambulatory Visit (HOSPITAL_COMMUNITY): Payer: Self-pay

## 2023-11-28 ENCOUNTER — Other Ambulatory Visit: Payer: Self-pay

## 2023-11-28 MED ORDER — ATORVASTATIN CALCIUM 40 MG PO TABS: 40.0000 mg | ORAL_TABLET | Freq: Every day | ORAL | 1 refills | Status: DC

## 2023-11-28 NOTE — Telephone Encounter (Signed)
 This is a CHF pt. Pt is still being seen in the CHF clinic. Please address

## 2023-11-28 NOTE — Telephone Encounter (Signed)
 Insurance companies are becoming increasingly stricter about requiring thorough documentation of lifestyle modifications in the patient's chart at each visit. This includes detailed records of diet recommendations (caloric intake, etc), exercise plans (amount of time/wk, etc), and an emphasis on the patient's commitment to continuing these efforts while on medication.  Without this additional documentation in the chart notes, a prior authorization will most likely be denied. Please advise

## 2023-12-01 ENCOUNTER — Encounter: Payer: Self-pay | Admitting: Internal Medicine

## 2023-12-01 ENCOUNTER — Ambulatory Visit (INDEPENDENT_AMBULATORY_CARE_PROVIDER_SITE_OTHER): Admitting: Neurology

## 2023-12-01 ENCOUNTER — Encounter: Payer: Self-pay | Admitting: Neurology

## 2023-12-01 VITALS — BP 107/59 | HR 52 | Ht 64.0 in

## 2023-12-01 DIAGNOSIS — E1151 Type 2 diabetes mellitus with diabetic peripheral angiopathy without gangrene: Secondary | ICD-10-CM | POA: Diagnosis not present

## 2023-12-01 DIAGNOSIS — E1140 Type 2 diabetes mellitus with diabetic neuropathy, unspecified: Secondary | ICD-10-CM | POA: Diagnosis not present

## 2023-12-01 DIAGNOSIS — I5022 Chronic systolic (congestive) heart failure: Secondary | ICD-10-CM | POA: Diagnosis not present

## 2023-12-01 DIAGNOSIS — M48062 Spinal stenosis, lumbar region with neurogenic claudication: Secondary | ICD-10-CM | POA: Diagnosis not present

## 2023-12-01 DIAGNOSIS — I130 Hypertensive heart and chronic kidney disease with heart failure and stage 1 through stage 4 chronic kidney disease, or unspecified chronic kidney disease: Secondary | ICD-10-CM | POA: Diagnosis not present

## 2023-12-01 DIAGNOSIS — N189 Chronic kidney disease, unspecified: Secondary | ICD-10-CM | POA: Diagnosis not present

## 2023-12-01 DIAGNOSIS — D631 Anemia in chronic kidney disease: Secondary | ICD-10-CM | POA: Diagnosis not present

## 2023-12-01 DIAGNOSIS — Z9889 Other specified postprocedural states: Secondary | ICD-10-CM | POA: Diagnosis not present

## 2023-12-01 DIAGNOSIS — G621 Alcoholic polyneuropathy: Secondary | ICD-10-CM

## 2023-12-01 DIAGNOSIS — R413 Other amnesia: Secondary | ICD-10-CM | POA: Diagnosis not present

## 2023-12-01 DIAGNOSIS — E1122 Type 2 diabetes mellitus with diabetic chronic kidney disease: Secondary | ICD-10-CM | POA: Diagnosis not present

## 2023-12-01 DIAGNOSIS — Z4789 Encounter for other orthopedic aftercare: Secondary | ICD-10-CM | POA: Diagnosis not present

## 2023-12-01 NOTE — Progress Notes (Unsigned)
 Follow-up Visit   Date: 12/01/2023    JOYCELENE GIACOMELLI MRN: [id number] DOB: 1950/09/12    Leslie [name] is a 73 y.o. right-handed Caucasian female with  atrial fibrillation, ADHD, hyperlipidemia, diabetes mellitus, anxiety/depression, and hypertension returning to the clinic for follow-up of alcohol -induced neuropathy and new complaints of memory loss.  The patient was accompanied to the clinic by daughter who also provides collateral information.    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    - Counseled on alcohol  use and importance of stopping to prevent worsening neuropathy.  2.  Memory impairment with difficulty recalls details of conversation and appointments.  She scored very well on MOCA (27/30).    - MRI brain wo contrast  - Formal neuropsychological testing  - Vitamin B12 and thyroid  studies are normal  3.  S/p lumbar surgery by Dr. Andy Bannister  - Continue PT for leg strengthening and gait training  Return to clinic in 6 months  --------------------------------------------- History of present illness: She fell in June 2024 fractured her sacrum and has been unable to walks since then.  She is able to stand to transfer, but quickly her legs feel shaky as if they cannot support her, so she prefers not to walk. She does have leg heaviness and buckling sensation if she walks.  She does not have leg weakness.  She has been doing physical therapy which has helped.  Starting in June 2024, she began having achy, stinging pain in the feet.   She takes gabapentin  for this.  EMG performed at [facility] showed axonal sensorimotor neuropathy. She drinks a bottle of wine nightly x 3 years.  No diabetes or history of chemotherapy exposure.     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  She lives at home in a one-level home.  Daughter comes during the daytime and took leave of absence to  take care of her.   UPDATE 12/02/2023:  She underwent lumbar L4-5 and L5-S1 laminectomy and foraminotomy in March 2025.  She is getting home PT and has been able to walk about 30 feet with a rollator, which is something she has not been able to do since June 2024.  She continues to drink alcohol  daily (she reports less than a bottle).  Daughter has noticed worsening memory changes such that she forgets details easily and often has to be reminded.  Daughter manages medications and finances for the past 6 months.   Medications:  Current Outpatient Medications on File Prior to Visit  Medication Sig Dispense Refill   ALPRAZolam  (XANAX ) 0.25 MG tablet Take 1 tablet (0.25 mg total) by mouth at bedtime as needed for anxiety. 30 tablet 0   amphetamine -dextroamphetamine  (ADDERALL  XR) 30 MG 24 hr capsule Take 1 capsule (30 mg total) by mouth daily. 30 capsule 0   amphetamine -dextroamphetamine  (ADDERALL  XR) 30 MG 24 hr capsule Take 1 capsule (30 mg total) by mouth daily. 30 capsule 0   amphetamine -dextroamphetamine  (ADDERALL  XR) 30 MG 24 hr capsule Take 1 capsule (30 mg total) by mouth daily. 30 capsule 0   amphetamine -dextroamphetamine  (ADDERALL ) 20 MG tablet Take 1 tablet (20 mg total) by mouth daily. 30 tablet 0   amphetamine -dextroamphetamine  (ADDERALL ) 20 MG tablet Take 1 tablet (20 mg total) by mouth daily. 30 tablet 0   amphetamine -dextroamphetamine  (ADDERALL ) 20 MG tablet Take 1 tablet (20 mg total) by mouth daily. 30 tablet 0   apixaban  (ELIQUIS ) 5 MG  TABS tablet TAKE 1 TABLET(5 MG) BY MOUTH TWICE DAILY 180 tablet 1   atorvastatin  (LIPITOR) 40 MG tablet Take 1 tablet (40 mg total) by mouth daily. Take 40 mg by mouth daily.Please schedule an appointment for further refills 30 tablet 1   Cholecalciferol (VITAMIN D3) 50 MCG (2000 UT) TABS Take 2,000 Units by mouth daily.     empagliflozin  (JARDIANCE ) 10 MG TABS tablet Take 1 tablet (10 mg total) by mouth daily before breakfast. 90 tablet 3   FLUoxetine   (PROZAC ) 40 MG capsule TAKE 1 CAPSULE(40 MG) BY MOUTH EVERY MORNING 90 capsule 1   furosemide  (LASIX ) 40 MG tablet Take 20 mg by mouth. Every other day     gabapentin  (NEURONTIN ) 300 MG capsule TAKE 1 CAPSULE BY MOUTH THREE TIMES DAILY (Patient taking differently: Take 300-600 mg by mouth See admin instructions. Take one  capsule in the morning, one capsule in the afternoon, and two capsules in the evening.) 90 capsule 2   lamoTRIgine  (LAMICTAL ) 100 MG tablet TAKE 1 TABLET(100 MG) BY MOUTH TWICE DAILY 180 tablet 1   losartan  (COZAAR ) 25 MG tablet TAKE 1/2 TABLET(12.5 MG) BY MOUTH DAILY (Patient taking differently: Take 25 mg by mouth daily.) 45 tablet 3   melatonin 3 MG TABS tablet Take 1 tablet (3 mg total) by mouth at bedtime.  0   metoprolol  succinate (TOPROL -XL) 50 MG 24 hr tablet Take 25 mg by mouth daily.     Multiple Vitamins-Minerals (PRESERVISION AREDS 2) CAPS Take 1 capsule by mouth 2 (two) times daily.     potassium chloride  (KLOR-CON  M) 10 MEQ tablet Take 10 mEq by mouth daily.     Semaglutide -Weight Management (WEGOVY ) 0.25 MG/0.5ML SOAJ Inject 0.25 mg into the skin once a week. 2 mL 0   spironolactone  (ALDACTONE ) 25 MG tablet Take 0.5 tablets (12.5 mg total) by mouth daily. 30 tablet 5   zolpidem  (AMBIEN ) 5 MG tablet TAKE 1 TABLET(5 MG) BY MOUTH AT BEDTIME AS NEEDED FOR SLEEP. 30 tablet 0   No current facility-administered medications on file prior to visit.    Allergies:  Allergies  Allergen Reactions   Amoxicillin Hives   Misc. Sulfonamide Containing Compounds    Nsaids     Due Gastric Bypass   Sulfamethoxazole Nausea And Vomiting    See patient list of med intolerances due to h/o gastric bypass and nephrectomy    Vital Signs:  BP (!) 107/59   Pulse (!) 52   Ht 5\' 4"  (1.626 m)   BMI 32.61 kg/m    Neurological Exam: MENTAL STATUS including orientation to time, place, person, recent and remote memory, attention span and concentration, language, and fund of knowledge  is fairly intact.  Speech is not dysarthric.    12/01/2023   10:42 AM  Montreal Cognitive Assessment   Visuospatial/ Executive (0/5) 3  Naming (0/3) 3  Attention: Read list of digits (0/2) 2  Attention: Read list of letters (0/1) 1  Attention: Serial 7 subtraction starting at 100 (0/3) 3  Language: Repeat phrase (0/2) 2  Language : Fluency (0/1) 1  Abstraction (0/2) 1  Delayed Recall (0/5) 5  Orientation (0/6) 6  Total 27  Adjusted Score (based on education) 27    CRANIAL NERVES:  Pupils equal round and reactive to light.  Normal conjugate, extra-ocular eye movements in all directions of gaze.  No ptosis.  Face is symmetric.   MOTOR:  Motor strength is 5/5 the arms, she is antigravity and able to resist  proximally in the legs, bilateral foot drop with the left supported in a brace.    MSRs:  Reflexes are 2+/4 throughout, except absent at the ankles.  SENSORY:  Vibration reduced at the ankles.   COORDINATION/GAIT:  Gait not tested, patient in wheelchair  Data: MRI lumbar spine 01/02/2023: 1. Sacral fractures are better seen on the same-day CT lumbar spine. No acute fracture in the lumbar spine. 2. L5-S1 severe spinal canal stenosis. Effacement of the lateral recesses at this level likely compresses the descending S1 nerve roots. 3. L4-L5 moderate spinal canal stenosis. Narrowing of the lateral recesses at this level could affect the descending L5 nerve roots. 4. L3-L4 mild to moderate spinal canal stenosis and mild right neural foraminal narrowing. Narrowing of the lateral recesses at this level could affect the descending L4 nerve roots.   MRI lumbar spine wo contrast 06/05/2023: 1. At L4-5 there is a minimal broad-based disc bulge. Severe bilateral facet arthropathy. Severe spinal stenosis. No foraminal stenosis. 2. Otherwise, lumbar spine spondylosis as described above. 3.  No acute osseous injury of the lumbar spine.  Lab Results  Component Value Date   VITAMINB12  1,440 (H) 06/17/2023    Thank you for allowing me to participate in patient's care.  If I can answer any additional questions, I would be pleased to do so.    Sincerely,    Marletta Bousquet K. Lydia Sams, DO

## 2023-12-01 NOTE — Patient Instructions (Addendum)
 Encouraged to stop drinking alcohol   MRI brain   You have been referred for a neurocognitive evaluation (i.e., evaluation of memory and thinking abilities). Please bring someone with you to this appointment if possible, as it is helpful for the neuropsychologist to hear from both you and another adult who knows you well. Please bring eyeglasses and hearing aids if you wear them and take any medications as you normally would. Please fully abstain from all alcohol , marijuana, or other substances prior to your appointment.   The evaluation will take approximately 2-3 hours and has two parts:   The first part is a clinical interview with the neuropsychologist, Dr. Kitty Perkins or Dr. Donavon Fudge. During the interview, the neuropsychologist will speak with you and the individual you brought to the appointment.    The second part of the evaluation is testing with the doctor's technician, aka psychometrician, Dana or [employer]. During the testing, the technician will ask you to remember different types of material, solve problems, and answer some questionnaires. Your family member will not be present for this portion of the evaluation.   Please note: We have to reserve several hours of the neuropsychologist's time and the psychometrician's time for your evaluation appointment. As such, there is a No-Show fee of $100. If you are unable to attend any of your appointments, please contact our office as soon as possible to reschedule.

## 2023-12-01 NOTE — Progress Notes (Signed)
 Established Patient Office Visit     CC/Reason for Visit: Discuss weight loss  HPI: Leslie [name] is a 73 y.o. female who is coming in today for the above mentioned reasons. Past Medical History is significant for: Chronic heart failure with reduced ejection fraction followed by cardiology, ADHD, generalized anxiety, hypertension, lumbar spinal stenosis.  In March she underwent an uncomplicated lumbar laminectomy.  She still remains sedentary although states she is making gains with physical therapy.  She is interested in pursuing a GLP-1 for weight loss.  I think this will be beneficial for her heart failure as well.  She is already working on being more mobile with physical therapy although her activity is limited by her recent lumbar spinal surgery.  She is working with a Data processing manager in regards to healthier eating choices.   Past Medical/Surgical History: Past Medical History:  Diagnosis Date   ADD (attention deficit disorder)    Allergy    Anemia    Arthritis 07/22/2014   osteoarthritis left knee   Colon polyps    Dysrhythmia    A-fib   Hypertension    Left knee pain    chronic   Narcotic addiction (HCC)    Neuropathy    bilateral feet   Obesity    post gastric bypass   Spinal stenosis    Vitamin D  deficiency     Past Surgical History:  Procedure Laterality Date   arthscopic knee surgery Left    several times   ATRIAL FIBRILLATION ABLATION N/A 04/11/2023   Procedure: ATRIAL FIBRILLATION ABLATION;  Surgeon: Efraim Grange, MD;  Location: [facility];  Service: Cardiovascular;  Laterality: N/A;   BREAST BIOPSY     BREAST SURGERY  years ago   breast biopsy   CHOLECYSTECTOMY     EYE SURGERY Bilateral 2013   Toric Lens implants   EYE SURGERY Right 2014   corneal amniotic membrane   FORAMINOTOMY 2 LEVEL N/A 10/14/2023   Procedure: Posterior Lumbar Decompression Lumbar four-lumbar five Lumbar five -Lumbar Sacral one;  Surgeon: Van Gelinas, MD;   Location: [facility] OR;  Service: Neurosurgery;  Laterality: N/A;   GASTRIC BYPASS  2003   IR SACROPLASTY BILATERAL  01/07/2023   KIDNEY DONATION  2004   ROTATOR CUFF REPAIR Right    TOTAL KNEE ARTHROPLASTY Left 06/16/2015   Procedure: LEFT TOTAL KNEE ARTHROPLASTY;  Surgeon: Arnie Lao, MD;  Location: [facility];  Service: Orthopedics;  Laterality: Left;   TOTAL KNEE ARTHROPLASTY Right 05/04/2021   Procedure: RIGHT TOTAL KNEE ARTHROPLASTY;  Surgeon: Arnie Lao, MD;  Location: [facility];  Service: Orthopedics;  Laterality: Right;    Social History:  reports that she quit smoking about 31 years ago. Her smoking use included cigarettes. She has never used smokeless tobacco. She reports current alcohol  use. She reports that she does not use drugs.  Allergies: Allergies  Allergen Reactions   Amoxicillin Hives   Misc. Sulfonamide Containing Compounds    Nsaids     Due Gastric Bypass   Sulfamethoxazole Nausea And Vomiting    See patient list of med intolerances due to h/o gastric bypass and nephrectomy    Family History:  Family History  Problem Relation Age of Onset   Heart disease Mother        post CABG history of CHF   COPD Father    Diabetes Sister    Hypertension Sister        post renal transplant  Heart disease Brother        CAD   Stomach cancer Neg Hx    Pancreatic cancer Neg Hx    Colon cancer Neg Hx    Esophageal cancer Neg Hx      Current Outpatient Medications:    ALPRAZolam  (XANAX ) 0.25 MG tablet, Take 1 tablet (0.25 mg total) by mouth at bedtime as needed for anxiety., Disp: 30 tablet, Rfl: 0   amphetamine -dextroamphetamine  (ADDERALL  XR) 30 MG 24 hr capsule, Take 1 capsule (30 mg total) by mouth daily., Disp: 30 capsule, Rfl: 0   amphetamine -dextroamphetamine  (ADDERALL  XR) 30 MG 24 hr capsule, Take 1 capsule (30 mg total) by mouth daily., Disp: 30 capsule, Rfl: 0   amphetamine -dextroamphetamine  (ADDERALL  XR) 30 MG 24 hr capsule, Take 1 capsule (30  mg total) by mouth daily., Disp: 30 capsule, Rfl: 0   amphetamine -dextroamphetamine  (ADDERALL ) 20 MG tablet, Take 1 tablet (20 mg total) by mouth daily., Disp: 30 tablet, Rfl: 0   amphetamine -dextroamphetamine  (ADDERALL ) 20 MG tablet, Take 1 tablet (20 mg total) by mouth daily., Disp: 30 tablet, Rfl: 0   amphetamine -dextroamphetamine  (ADDERALL ) 20 MG tablet, Take 1 tablet (20 mg total) by mouth daily., Disp: 30 tablet, Rfl: 0   apixaban  (ELIQUIS ) 5 MG TABS tablet, TAKE 1 TABLET(5 MG) BY MOUTH TWICE DAILY, Disp: 180 tablet, Rfl: 1   Cholecalciferol (VITAMIN D3) 50 MCG (2000 UT) TABS, Take 2,000 Units by mouth daily., Disp: , Rfl:    empagliflozin  (JARDIANCE ) 10 MG TABS tablet, Take 1 tablet (10 mg total) by mouth daily before breakfast., Disp: 90 tablet, Rfl: 3   FLUoxetine  (PROZAC ) 40 MG capsule, TAKE 1 CAPSULE(40 MG) BY MOUTH EVERY MORNING, Disp: 90 capsule, Rfl: 1   furosemide  (LASIX ) 40 MG tablet, Take 20 mg by mouth. Every other day, Disp: , Rfl:    gabapentin  (NEURONTIN ) 300 MG capsule, TAKE 1 CAPSULE BY MOUTH THREE TIMES DAILY (Patient taking differently: Take 300-600 mg by mouth See admin instructions. Take one  capsule in the morning, one capsule in the afternoon, and two capsules in the evening.), Disp: 90 capsule, Rfl: 2   lamoTRIgine  (LAMICTAL ) 100 MG tablet, TAKE 1 TABLET(100 MG) BY MOUTH TWICE DAILY, Disp: 180 tablet, Rfl: 1   losartan  (COZAAR ) 25 MG tablet, TAKE 1/2 TABLET(12.5 MG) BY MOUTH DAILY (Patient taking differently: Take 25 mg by mouth daily.), Disp: 45 tablet, Rfl: 3   melatonin 3 MG TABS tablet, Take 1 tablet (3 mg total) by mouth at bedtime., Disp: , Rfl: 0   metoprolol  succinate (TOPROL -XL) 50 MG 24 hr tablet, Take 25 mg by mouth daily., Disp: , Rfl:    Multiple Vitamins-Minerals (PRESERVISION AREDS 2) CAPS, Take 1 capsule by mouth 2 (two) times daily., Disp: , Rfl:    potassium chloride  (KLOR-CON  M) 10 MEQ tablet, Take 10 mEq by mouth daily., Disp: , Rfl:     Semaglutide -Weight Management (WEGOVY ) 0.25 MG/0.5ML SOAJ, Inject 0.25 mg into the skin once a week., Disp: 2 mL, Rfl: 0   spironolactone  (ALDACTONE ) 25 MG tablet, Take 0.5 tablets (12.5 mg total) by mouth daily., Disp: 30 tablet, Rfl: 5   zolpidem  (AMBIEN ) 5 MG tablet, TAKE 1 TABLET(5 MG) BY MOUTH AT BEDTIME AS NEEDED FOR SLEEP., Disp: 30 tablet, Rfl: 0   atorvastatin  (LIPITOR) 40 MG tablet, Take 1 tablet (40 mg total) by mouth daily. Take 40 mg by mouth daily.Please schedule an appointment for further refills, Disp: 30 tablet, Rfl: 1  Review of Systems:  Negative unless indicated  in HPI.   Physical Exam: Vitals:   11/27/23 1549  BP: 102/70  Pulse: (!) 41  Temp: 97.6 F (36.4 C)  TempSrc: Oral  SpO2: 98%  Weight: 190 lb (86.2 kg)    Body mass index is 32.61 kg/m.    Impression and Plan:  Morbid obesity (HCC) -     Wegovy ; Inject 0.25 mg into the skin once a week.  Dispense: 2 mL; Refill: 0  Chronic HFrEF (heart failure with reduced ejection fraction) (HCC) -     Wegovy ; Inject 0.25 mg into the skin once a week.  Dispense: 2 mL; Refill: 0   - I think she would be a good candidate for GLP-1, will try Wegovy .  Patient advised that this would likely require prior authorization. - She will continue to work with dietitian and with physical therapy to improve her mobility and diet choices.  Time spent:31 minutes reviewing chart, interviewing and examining patient and formulating plan of care.     Marguerita Shih, MD [facility] at [facility]

## 2023-12-03 DIAGNOSIS — N189 Chronic kidney disease, unspecified: Secondary | ICD-10-CM | POA: Diagnosis not present

## 2023-12-03 DIAGNOSIS — Z4789 Encounter for other orthopedic aftercare: Secondary | ICD-10-CM | POA: Diagnosis not present

## 2023-12-03 DIAGNOSIS — I5022 Chronic systolic (congestive) heart failure: Secondary | ICD-10-CM | POA: Diagnosis not present

## 2023-12-03 DIAGNOSIS — E1122 Type 2 diabetes mellitus with diabetic chronic kidney disease: Secondary | ICD-10-CM | POA: Diagnosis not present

## 2023-12-03 DIAGNOSIS — I130 Hypertensive heart and chronic kidney disease with heart failure and stage 1 through stage 4 chronic kidney disease, or unspecified chronic kidney disease: Secondary | ICD-10-CM | POA: Diagnosis not present

## 2023-12-03 DIAGNOSIS — E1151 Type 2 diabetes mellitus with diabetic peripheral angiopathy without gangrene: Secondary | ICD-10-CM | POA: Diagnosis not present

## 2023-12-03 DIAGNOSIS — D631 Anemia in chronic kidney disease: Secondary | ICD-10-CM | POA: Diagnosis not present

## 2023-12-03 DIAGNOSIS — E1140 Type 2 diabetes mellitus with diabetic neuropathy, unspecified: Secondary | ICD-10-CM | POA: Diagnosis not present

## 2023-12-03 DIAGNOSIS — M48062 Spinal stenosis, lumbar region with neurogenic claudication: Secondary | ICD-10-CM | POA: Diagnosis not present

## 2023-12-09 ENCOUNTER — Encounter: Payer: Self-pay | Admitting: Neurology

## 2023-12-10 ENCOUNTER — Other Ambulatory Visit (HOSPITAL_COMMUNITY): Payer: Self-pay | Admitting: Cardiology

## 2023-12-11 ENCOUNTER — Other Ambulatory Visit (HOSPITAL_COMMUNITY): Payer: Self-pay

## 2023-12-11 DIAGNOSIS — I5022 Chronic systolic (congestive) heart failure: Secondary | ICD-10-CM | POA: Diagnosis not present

## 2023-12-11 DIAGNOSIS — Z4789 Encounter for other orthopedic aftercare: Secondary | ICD-10-CM | POA: Diagnosis not present

## 2023-12-11 DIAGNOSIS — E1151 Type 2 diabetes mellitus with diabetic peripheral angiopathy without gangrene: Secondary | ICD-10-CM | POA: Diagnosis not present

## 2023-12-11 DIAGNOSIS — E1122 Type 2 diabetes mellitus with diabetic chronic kidney disease: Secondary | ICD-10-CM | POA: Diagnosis not present

## 2023-12-11 DIAGNOSIS — M48062 Spinal stenosis, lumbar region with neurogenic claudication: Secondary | ICD-10-CM | POA: Diagnosis not present

## 2023-12-11 DIAGNOSIS — I130 Hypertensive heart and chronic kidney disease with heart failure and stage 1 through stage 4 chronic kidney disease, or unspecified chronic kidney disease: Secondary | ICD-10-CM | POA: Diagnosis not present

## 2023-12-11 DIAGNOSIS — E1140 Type 2 diabetes mellitus with diabetic neuropathy, unspecified: Secondary | ICD-10-CM | POA: Diagnosis not present

## 2023-12-11 DIAGNOSIS — N189 Chronic kidney disease, unspecified: Secondary | ICD-10-CM | POA: Diagnosis not present

## 2023-12-11 DIAGNOSIS — D631 Anemia in chronic kidney disease: Secondary | ICD-10-CM | POA: Diagnosis not present

## 2023-12-11 NOTE — Telephone Encounter (Signed)
 Pharmacy Patient Advocate Encounter   Received notification from Physician's Office that prior authorization for Wegovy  is required/requested.   Insurance verification completed.   The patient is insured through [place] .   Per test claim: PA required; PA started via CoverMyMeds. KEY   . Waiting for clinical questions to populate.

## 2023-12-12 ENCOUNTER — Other Ambulatory Visit (HOSPITAL_COMMUNITY): Payer: Self-pay

## 2023-12-12 NOTE — Telephone Encounter (Signed)
 Pharmacy Patient Advocate Encounter  Received notification from [employer] that Prior Authorization for Wegovy  0.25 has been DENIED.  Full denial letter will be uploaded to the media tab. See denial reason below.   PA #/Case ID/Reference #: [id number]

## 2023-12-12 NOTE — Telephone Encounter (Signed)
 Clinical questions answered with heart failure note and I50.22 ICD 10. [place] pending, key [id number]

## 2023-12-13 DIAGNOSIS — R262 Difficulty in walking, not elsewhere classified: Secondary | ICD-10-CM | POA: Diagnosis not present

## 2023-12-16 ENCOUNTER — Telehealth: Payer: Self-pay | Admitting: *Deleted

## 2023-12-16 DIAGNOSIS — M48062 Spinal stenosis, lumbar region with neurogenic claudication: Secondary | ICD-10-CM | POA: Diagnosis not present

## 2023-12-16 DIAGNOSIS — E1122 Type 2 diabetes mellitus with diabetic chronic kidney disease: Secondary | ICD-10-CM | POA: Diagnosis not present

## 2023-12-16 DIAGNOSIS — E1140 Type 2 diabetes mellitus with diabetic neuropathy, unspecified: Secondary | ICD-10-CM | POA: Diagnosis not present

## 2023-12-16 DIAGNOSIS — N189 Chronic kidney disease, unspecified: Secondary | ICD-10-CM | POA: Diagnosis not present

## 2023-12-16 DIAGNOSIS — E1151 Type 2 diabetes mellitus with diabetic peripheral angiopathy without gangrene: Secondary | ICD-10-CM | POA: Diagnosis not present

## 2023-12-16 DIAGNOSIS — D631 Anemia in chronic kidney disease: Secondary | ICD-10-CM | POA: Diagnosis not present

## 2023-12-16 DIAGNOSIS — I5022 Chronic systolic (congestive) heart failure: Secondary | ICD-10-CM | POA: Diagnosis not present

## 2023-12-16 DIAGNOSIS — Z4789 Encounter for other orthopedic aftercare: Secondary | ICD-10-CM | POA: Diagnosis not present

## 2023-12-16 DIAGNOSIS — I130 Hypertensive heart and chronic kidney disease with heart failure and stage 1 through stage 4 chronic kidney disease, or unspecified chronic kidney disease: Secondary | ICD-10-CM | POA: Diagnosis not present

## 2023-12-16 NOTE — Telephone Encounter (Signed)
 Copied from CRM [id number]. Topic: Clinical - Home Health Verbal Orders >> Dec 16, 2023 11:49 AM Adonis Hoot wrote: Caller/Agency: Jim/[facility] Callback Number: [phone] Service Requested: Occupational Therapy   Any new concerns about the patient? Yes Fell,forearem has some bruising,and bruise on left eye. Patient doesn't think that it is broken,just brusied. They were going to discharge her,but after a fall like the one she had they would like to see her more,then eventually get her set up for out patient ,if orders have not already been place for the outpatient therapy

## 2023-12-18 NOTE — Telephone Encounter (Signed)
 Left detailed message on machine for Leslie [name]/[facility] with verbal orders for OT.

## 2023-12-20 DIAGNOSIS — R350 Frequency of micturition: Secondary | ICD-10-CM | POA: Diagnosis not present

## 2023-12-23 ENCOUNTER — Other Ambulatory Visit: Payer: Self-pay | Admitting: Internal Medicine

## 2023-12-23 DIAGNOSIS — G479 Sleep disorder, unspecified: Secondary | ICD-10-CM

## 2023-12-24 ENCOUNTER — Other Ambulatory Visit

## 2023-12-25 ENCOUNTER — Telehealth: Payer: Self-pay | Admitting: *Deleted

## 2023-12-25 NOTE — Telephone Encounter (Signed)
 Copied from CRM [id number]. Topic: Clinical - Medication Prior Auth >> Dec 25, 2023  4:33 PM Fredrica W wrote: Reason for CRM: Patient called states she wanted to speak to provider or Nurse. States she was trying to get prior Prior approval for WeGovy . Let her know denial received. She would like a call back with update on next steps. Thank You

## 2023-12-30 DIAGNOSIS — N390 Urinary tract infection, site not specified: Secondary | ICD-10-CM | POA: Diagnosis not present

## 2023-12-31 ENCOUNTER — Encounter (HOSPITAL_COMMUNITY): Admitting: Cardiology

## 2024-01-03 ENCOUNTER — Other Ambulatory Visit

## 2024-01-06 ENCOUNTER — Ambulatory Visit: Admitting: Neurology

## 2024-01-06 ENCOUNTER — Encounter: Payer: Self-pay | Admitting: Psychology

## 2024-01-13 DIAGNOSIS — R262 Difficulty in walking, not elsewhere classified: Secondary | ICD-10-CM | POA: Diagnosis not present

## 2024-01-14 ENCOUNTER — Ambulatory Visit: Payer: Self-pay

## 2024-01-14 NOTE — Telephone Encounter (Signed)
 FYI Only or Action Required?: FYI only for provider.  Patient was last seen in primary care on 11/27/2023 by Theophilus Andrews, Tully GRADE, MD. Called Nurse Triage reporting wound. Symptoms began about a month ago. Interventions attempted: Nothing. Symptoms are: unchanged.  Triage Disposition: No disposition on file.  Patient/caregiver understands and will follow disposition?:    Copied from CRM [id number]. Topic: Clinical - Red Word Triage >> Jan 14, 2024  3:01 PM Larissa S wrote: [facility] that prompted transfer to Nurse Triage: Painful Wound- Lt  foot Reason for Disposition  [1] After 14 days AND [2] wound isn't healed  Answer Assessment - Initial Assessment Questions 1. APPEARANCE of INJURY: What does the injury look like?     Scabbed over 2. SIZE: How large is the cut?      Size of a dime 3. BLEEDING: Is it bleeding now? If Yes, ask: Is it difficult to stop?      no 4. LOCATION: Where is the injury located?      Top of left foot second toe over 5. ONSET: How long ago did the injury occur?      3 weeks ago 6. MECHANISM: Tell me how it happened.      unknown 7. TETANUS: When was the last tetanus booster?     unknown 8. PREGNANCY: Is there any chance you are pregnant? When was your last menstrual period?     na  Protocols used: Cuts and Lacerations-A-AH

## 2024-01-14 NOTE — Telephone Encounter (Signed)
 Patient has scheduled an appointment for 01/15/24.

## 2024-01-15 ENCOUNTER — Encounter: Payer: Self-pay | Admitting: Family Medicine

## 2024-01-15 ENCOUNTER — Institutional Professional Consult (permissible substitution): Admitting: Psychology

## 2024-01-15 ENCOUNTER — Ambulatory Visit (INDEPENDENT_AMBULATORY_CARE_PROVIDER_SITE_OTHER): Admitting: Family Medicine

## 2024-01-15 ENCOUNTER — Ambulatory Visit: Payer: Self-pay

## 2024-01-15 VITALS — BP 112/64 | HR 58 | Temp 97.9°F | Ht 64.0 in | Wt 193.2 lb

## 2024-01-15 DIAGNOSIS — R8290 Unspecified abnormal findings in urine: Secondary | ICD-10-CM

## 2024-01-15 DIAGNOSIS — S91109A Unspecified open wound of unspecified toe(s) without damage to nail, initial encounter: Secondary | ICD-10-CM

## 2024-01-15 LAB — POC URINALSYSI DIPSTICK (AUTOMATED)
Bilirubin, UA: NEGATIVE
Blood, UA: NEGATIVE
Glucose, UA: POSITIVE — AB
Ketones, UA: NEGATIVE
Leukocytes, UA: NEGATIVE
Nitrite, UA: NEGATIVE
Protein, UA: NEGATIVE
Spec Grav, UA: 1.015 (ref 1.010–1.025)
Urobilinogen, UA: 0.2 U/dL
pH, UA: 6 (ref 5.0–8.0)

## 2024-01-15 NOTE — Progress Notes (Signed)
 Established Patient Office Visit   Subjective  Patient ID: Leslie [name], female    DOB: 10/31/1950  Age: 73 y.o. MRN: [id number]  Chief Complaint  Patient presents with   Medical Management of Chronic Issues    Wound on the left 2nd toe, started 6 weeks ago, and UTI burning odor and Frequency, had to video visits given Keflex   Pt accompanied by her daughter.  Pt is a 73 year old female seen for ongoing concerns.  Patient endorses sore on left foot second digit x 6 weeks or more that has not healed.  Patient does not recall injury.  Does mention hitting toe with wheelchair today prior to visit.  Patient stopped wearing shoes thinking they may have rubbed against toe.  Now wearing gripper socks and is not mobile at home.  Patient's daughter would like to make sure patient no longer has a UTI.  Seen via video visit x 2 for symptoms of burning, odor, frequency.  On 12/20/2023 given Keflex x 7 days and again on 12/30/2023 given Macrobid x 5 days.  Patient endorses malodorous urine.  May drink 1 bottle of water per day.  Patient mentions taking Jardiance  though not having diabetes.  Patient states her insurance does not require a referral for specialist.   Patient Active Problem List   Diagnosis Date Noted   Lumbar spinal stenosis 10/14/2023   Frequent PVCs 07/21/2023   Hypercoagulable state due to persistent atrial fibrillation (HCC) 05/14/2023   Encounter for monitoring amiodarone  therapy 05/14/2023   Persistent atrial fibrillation (HCC) 05/14/2023   Tylenol  overdose 01/28/2023   Acetaminophen  overdose 01/26/2023   Malnutrition of moderate degree 01/08/2023   Sacral fracture (HCC) 01/02/2023   Chronic HFrEF (heart failure with reduced ejection fraction) (HCC) 01/02/2023   AKI (acute kidney injury) (HCC) 01/02/2023   S/P gastric bypass 01/02/2023   Renal insufficiency 09/19/2022   Acute CHF (congestive heart failure) (HCC) 08/07/2022   Status post right knee replacement  05/04/2021   Vitamin B12 deficiency 03/14/2021   Macrocytosis without anemia 03/14/2021   Vitamin D  deficiency 02/17/2020   Sleep disorder 07/10/2018   ADHD (attention deficit hyperactivity disorder) 10/01/2017   Unilateral primary osteoarthritis, right knee 08/20/2017   Chronic pain of both shoulders 08/20/2017   Complete tear of right rotator cuff 04/29/2017   Osteoarthritis of left knee 06/16/2015   Status post total left knee replacement 06/16/2015   Attention deficit disorder 09/14/2007   History of colonic polyps 08/03/2007   Essential hypertension 01/20/2007   OBESITY NOS 12/04/2006   Allergic rhinitis 12/04/2006   GASTROJEJUNOSTOMY, HX OF 12/04/2006   Personal history presenting hazards to health 12/04/2006   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   Colon polyps    Depression 1995   Dysrhythmia    A-fib   GERD (gastroesophageal reflux disease) 2020   Hypertension    Left knee pain    chronic   Narcotic addiction (HCC)    Neuromuscular disorder (HCC) 2013   Shingles   Neuropathy    bilateral feet   Obesity    post gastric bypass   Spinal stenosis    Vitamin D  deficiency    Past Surgical History:  Procedure Laterality Date   APPENDECTOMY  1980  arthscopic knee surgery Left    several times   ATRIAL FIBRILLATION ABLATION N/A 04/11/2023   Procedure: ATRIAL FIBRILLATION ABLATION;  Surgeon: Nancey Eulas BRAVO, MD;  Location: [facility];  Service: Cardiovascular;  Laterality: N/A;   BREAST BIOPSY     BREAST SURGERY  years ago   breast biopsy   CHOLECYSTECTOMY     EYE SURGERY Bilateral 2013   Toric Lens implants   EYE SURGERY Right 2014   corneal amniotic membrane   FORAMINOTOMY 2 LEVEL N/A 10/14/2023   Procedure: Posterior Lumbar Decompression  Lumbar four-lumbar five Lumbar five -Lumbar Sacral one;  Surgeon: Debby Dorn MATSU, MD;  Location: [facility];  Service: Neurosurgery;  Laterality: N/A;   GASTRIC BYPASS  2003   IR SACROPLASTY BILATERAL  01/07/2023   JOINT REPLACEMENT  2016   Left knee   KIDNEY DONATION  2004   ROTATOR CUFF REPAIR Right    TOTAL KNEE ARTHROPLASTY Left 06/16/2015   Procedure: LEFT TOTAL KNEE ARTHROPLASTY;  Surgeon: Lonni CINDERELLA Poli, MD;  Location: [facility];  Service: Orthopedics;  Laterality: Left;   TOTAL KNEE ARTHROPLASTY Right 05/04/2021   Procedure: RIGHT TOTAL KNEE ARTHROPLASTY;  Surgeon: Poli Lonni CINDERELLA, MD;  Location: [facility];  Service: Orthopedics;  Laterality: Right;   Social History   Tobacco Use   Smoking status: Former    Current packs/day: 0.00    Types: Cigarettes    Quit date: 07/22/1992    Years since quitting: 31.5   Smokeless tobacco: Never  Vaping Use   Vaping status: Never Used  Substance Use Topics   Alcohol  use: Yes    Comment: wine daily   Drug use: No   Family History  Problem Relation Age of Onset   Heart disease Mother        post CABG history of CHF   COPD Father    Diabetes Sister    Hypertension Sister        post renal transplant   Heart disease Brother        CAD   Stomach cancer Neg Hx    Pancreatic cancer Neg Hx    Colon cancer Neg Hx    Esophageal cancer Neg Hx    Allergies  Allergen Reactions   Amoxicillin Hives   Misc. Sulfonamide Containing Compounds    Nsaids     Due Gastric Bypass   Sulfamethoxazole Nausea And Vomiting    See patient list of med intolerances due to h/o gastric bypass and nephrectomy    ROS Negative unless stated above    Objective:     BP 112/64 (BP Location: Left Arm, Patient Position: Sitting, Cuff Size: Normal)   Pulse (!) 58   Temp 97.9 F (36.6 C) (Oral)   Ht 5' 4 (1.626 m)   Wt 193 lb 3.2 oz (87.6 kg)   BMI 33.16 kg/m  BP Readings from Last 3 Encounters:  01/15/24 112/64  12/01/23 (!) 107/59   11/27/23 102/70   Wt Readings from Last 3 Encounters:  01/15/24 193 lb 3.2 oz (87.6 kg)  11/27/23 190 lb (86.2 kg)  10/14/23 189 lb (85.7 kg)      Physical Exam  Skin:    Comments: Skin of bilateral feet appears mildly cyanotic at digits and laterally with a Reddi reticulated pattern.  Feet are not cold to touch.  L foot second digit with faint erythema excoriation, eschar/dried appearing skin on dorsum at PIP joint.  No increased warmth bilateral second toes partially flexed.  Neurological:     Comments: Gait not assessed as patient sitting in transport wheelchair.  Weakness in bilateral LEs.       11/27/2023    3:48 PM 10/07/2023    3:45 PM 07/22/2023   11:31 AM  Depression screen PHQ 2/9  Decreased Interest 0 0 0  Down, Depressed, Hopeless 1 1 0  PHQ - 2 Score 1 1 0  Altered sleeping 0    Tired, decreased energy 1    Change in appetite 1    Feeling bad or failure about yourself  0    Trouble concentrating 0    Moving slowly or fidgety/restless 0    Suicidal thoughts 0    PHQ-9 Score 3         No data to display           Results for orders placed or performed in visit on 01/15/24  POCT Urinalysis Dipstick (Automated)  Result Value Ref Range   Color, UA yellow    Clarity, UA clear    Glucose, UA Positive (A) Negative   Bilirubin, UA neg    Ketones, UA neg    Spec Grav, UA 1.015 1.010 - 1.025   Blood, UA neg    pH, UA 6.0 5.0 - 8.0   Protein, UA Negative Negative   Urobilinogen, UA 0.2 0.2 or 1.0 E.U./dL   Nitrite, UA neg    Leukocytes, UA Negative Negative      Assessment & Plan:   Malodorous urine -     POCT Urinalysis Dipstick (Automated)  Non-healing open wound of toe, initial encounter  Pt treated virtually for UTI symptoms.  POC UA this visit negative for continued UTI.  Glucose present likely 2/2 use of Jardiance .  Advised on the importance of drinking more water to help prevent symptoms.  Wound on left second toe difficulty heal.   Discussed the importance of avoiding further injury to toe.  Likely started as an abrasion possibly from footwear that was reaggravated.  Second toe of right foot starting to get a similar area.  Toes being partially flexed may also be contributing to skin breakdown.  Discussed referral to podiatry for wound care.  Declines as states she does not need referral for specialist.  Discussed supportive care.  Follow-up with PCP as needed.  Return if symptoms worsen or fail to improve.   Clotilda JONELLE Single, MD

## 2024-01-19 ENCOUNTER — Other Ambulatory Visit: Payer: Self-pay | Admitting: Internal Medicine

## 2024-01-19 DIAGNOSIS — G479 Sleep disorder, unspecified: Secondary | ICD-10-CM

## 2024-01-19 DIAGNOSIS — F900 Attention-deficit hyperactivity disorder, predominantly inattentive type: Secondary | ICD-10-CM

## 2024-01-19 MED ORDER — AMPHETAMINE-DEXTROAMPHET ER 30 MG PO CP24: 30.0000 mg | ORAL_CAPSULE | Freq: Every day | ORAL | 0 refills | Status: DC

## 2024-01-19 MED ORDER — AMPHETAMINE-DEXTROAMPHETAMINE 20 MG PO TABS: 20.0000 mg | ORAL_TABLET | Freq: Every day | ORAL | 0 refills | Status: DC

## 2024-01-19 NOTE — Addendum Note (Signed)
 Addended by: CLORIA ALBERTA CROME on: 01/19/2024 01:14 PM   Modules accepted: Orders

## 2024-01-19 NOTE — Telephone Encounter (Signed)
 Copied from CRM [id number]. Topic: Clinical - Medication Refill >> Jan 19, 2024 12:56 PM [place] E wrote: Medication: amphetamine -dextroamphetamine  (ADDERALL  XR) 30 MG 24 hr capsule amphetamine -dextroamphetamine  (ADDERALL ) 20 MG tablet   Has the patient contacted their pharmacy? Yes (Agent: If no, request that the patient contact the pharmacy for the refill. If patient does not wish to contact the pharmacy document the reason why and proceed with request.) (Agent: If yes, when and what did the pharmacy advise?)  This is the patient's preferred pharmacy:  [id number] [place], [place] - [address] AT [facility] OF [facility] [address] & [facility] [address] [place] [address] Phone: [phone] Fax: [phone]   Is this the correct pharmacy for this prescription? Yes If no, delete pharmacy and type the correct one.   Has the prescription been filled recently? No  Is the patient out of the medication? Yes  Has the patient been seen for an appointment in the last year OR does the patient have an upcoming appointment? Yes  Can we respond through MyChart? Yes  Agent: Please be advised that Rx refills may take up to 3 business days. We ask that you follow-up with your pharmacy.

## 2024-01-20 ENCOUNTER — Ambulatory Visit: Admitting: Neurology

## 2024-01-22 ENCOUNTER — Encounter: Admitting: Psychology

## 2024-01-28 ENCOUNTER — Telehealth (HOSPITAL_COMMUNITY): Payer: Self-pay | Admitting: Cardiology

## 2024-01-28 ENCOUNTER — Encounter (HOSPITAL_COMMUNITY): Admitting: Cardiology

## 2024-01-28 NOTE — Progress Notes (Signed)
 ADVANCED HE[facility] FAILURE CLINIC NOTE  Referring Physician: Theophilus Andrews, Jonna*  Primary Care: Theophilus Andrews, Tully GRADE, MD Primary Cardiologist: Vina Gull  CC: Heart Failure   HPI: Leslie [name] is a 73 y.o. female with heart failure with reduced ejection fraction, atrial fibrillation, history of obesity status post gastric bypass, history of nephrectomy presenting today to establish care.  Her cardiac history dates back to February 2024 when she was admitted to [facility] for dyspnea; echocardiogram at that time demonstrated an EF of 25 to 30% with global hypokinesis.  She was started on low-dose GDMT at that time.  She also had a 7-day Zio patch that captured a A-fib burden of 36%.  She underwent coronary CTA in March 2024 that showed only mild CAD.  Since that time she has been seen in [facility] clinic where GDMT was further uptitrated.  Interval history: - Today she returns for HF follow up with her daughter. Overall feeling fine. Limited by neuropathy. She remains in the wheel chair. Denies SOB/PND/Orthopnea. Appetite ok. No fever or chills. She has had 3 UTIs since starting jardiance . Previously tried farxiga  but stopped due to UTI/Fatigue.  Taking all medications. Followed by HHPT.  .   Current Outpatient Medications  Medication Sig Dispense Refill   ALPRAZolam  (XANAX ) 0.25 MG tablet TAKE 1 TABLET(0.25 MG) BY MOUTH AT BEDTIME AS NEEDED FOR ANXIETY 30 tablet 0   amphetamine -dextroamphetamine  (ADDERALL  XR) 30 MG 24 hr capsule Take 1 capsule (30 mg total) by mouth daily. 30 capsule 0   amphetamine -dextroamphetamine  (ADDERALL ) 20 MG tablet Take 1 tablet (20 mg total) by mouth daily. 30 tablet 0   apixaban  (ELIQUIS ) 5 MG TABS tablet TAKE 1 TABLET(5 MG) BY MOUTH TWICE DAILY 180 tablet 1   atorvastatin  (LIPITOR) 40 MG tablet Take 1 tablet (40 mg total) by mouth daily. Take 40 mg by mouth daily.Please schedule an appointment for further refills 30 tablet 1   Cholecalciferol (VITAMIN D3)  50 MCG (2000 UT) TABS Take 2,000 Units by mouth daily.     empagliflozin  (JARDIANCE ) 10 MG TABS tablet Take 1 tablet (10 mg total) by mouth daily before breakfast. 90 tablet 3   FLUoxetine  (PROZAC ) 40 MG capsule TAKE 1 CAPSULE(40 MG) BY MOUTH EVERY MORNING 90 capsule 1   furosemide  (LASIX ) 40 MG tablet Take 20 mg by mouth. Every other day     gabapentin  (NEURONTIN ) 300 MG capsule TAKE 1 CAPSULE BY MOUTH THREE TIMES DAILY 90 capsule 2   lamoTRIgine  (LAMICTAL ) 100 MG tablet TAKE 1 TABLET(100 MG) BY MOUTH TWICE DAILY 180 tablet 1   melatonin 3 MG TABS tablet Take 1 tablet (3 mg total) by mouth at bedtime.  0   Multiple Vitamins-Minerals (PRESERVISION AREDS 2) CAPS Take 1 capsule by mouth 2 (two) times daily.     potassium chloride  (KLOR-CON  M) 10 MEQ tablet Take 10 mEq by mouth daily.     spironolactone  (ALDACTONE ) 25 MG tablet Take 0.5 tablets (12.5 mg total) by mouth daily. 30 tablet 5   zolpidem  (AMBIEN ) 5 MG tablet TAKE 1 TABLET(5 MG) BY MOUTH AT BEDTIME AS NEEDED FOR SLEEP 30 tablet 0   losartan  (COZAAR ) 25 MG tablet TAKE 1/2 TABLET(12.5 MG) BY MOUTH DAILY (Patient taking differently: Take 25 mg by mouth daily.) 45 tablet 3   metoprolol  succinate (TOPROL -XL) 50 MG 24 hr tablet TAKE 1 TABLET(50 MG) BY MOUTH AT BEDTIME WITH OR IMMEDIATELY FOLLOWING A MEAL 90 tablet 0   Semaglutide -Weight Management (WEGOVY ) 0.25 MG/0.5ML SOAJ  Inject 0.25 mg into the skin once a week. (Patient not taking: Reported on 01/29/2024) 2 mL 0   No current facility-administered medications for this encounter.   PHYSICAL EXAM: Vitals:   01/29/24 1410  BP: 108/70  Pulse: 64  General:   No resp difficulty.Arrived in a wheel chair.  Neck: supple. no JVD.  Cor: PMI nondisplaced. Regular rate & rhythm. No rubs, gallops or murmurs. Lungs: clear Abdomen: soft, nontender, nondistended.  Extremities: no cyanosis, clubbing, rash, edema Neuro: alert & oriented x3   DATA REVIEW  ECG: 11/15/2022: Normal sinus rhythm as per  my personal interpretation  ECHO: 08/29/22: LVEF 25%, moderate MR, moderate AI.  As per my personal interpretation 12/19/22: LVEF 45%, mild AI as per my interpretation  CATH: Coronary CTA with mild nonobstructive CAD.   ASSESSMENT & PLAN:  Heart failure with reduced ejection fraction Etiology of HF: Nonischemic cardiomyopathy; coronary CTA from March 2024 with only mild CAD; possibly secondary to tachyarrhythmia.  Will cancel MRI as she has had a significant improvement in LVEF to 55-60%  NYHA class / AHA Stage:NYHA I  Volume status & Diuretics: euvolemic, Change lasix  to 20 mg as needed lower extremity edema. Stop potassium  Vasodilators:Continue  losartan  to 25mg  daily.  Beta-Blocker: Continue metoprolol  succinate 50 mg nightly FMJ:Rnwupwlz spironolactone  12.5 mg daily Cardiometabolic: 3 UTIs due to jardiance .  She was intolerant farixga. Would not re challenge. Devices therapies & Valvulopathies: Not indicated Advanced therapies: Not indicated  2. Paroxysmal atrial fibrillation -Now s/p AF ablation - Regular on exam.  -following with Dr. Nancey.   3. Nonobstructive coronary artery disease -Continue Lipitor 10 mg daily.  Followed by Dr. Vina Gull  4.  PVCs -Off amio. Continue Toprol  Xl.  -EP followed by Dr Nancey.   5.  Nephrectomy -Serum creatinine stable. Reviewed labs.  -Stop jardiance  due to frequent UTIs  6. 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  7. Obesity  Body mass index is 33.75 kg/m.  Referred GLP1 clinic.      Follow up in 1 year with Dr Gardenia.  Greig Mosses NP-C  2:27 PM

## 2024-01-28 NOTE — Telephone Encounter (Signed)
 Called to confirm/remind patient of their appointment at the [facility] on 01/28/24.   Appointment:   [x] Confirmed  [] Left mess   [] No answer/No voice mail  [] VM Full/unable to leave message  [] Phone not in service  Patient reminded to bring all medications and/or complete list.  Confirmed patient has transportation. Gave directions, instructed to utilize valet parking.

## 2024-01-29 ENCOUNTER — Ambulatory Visit (HOSPITAL_COMMUNITY)
Admission: RE | Admit: 2024-01-29 | Discharge: 2024-01-29 | Disposition: A | Discharge status: Home / Self Care | Source: Ambulatory Visit | Attending: Adult Health | Admitting: Adult Health

## 2024-01-29 VITALS — BP 108/70 | HR 54 | Wt 196.6 lb

## 2024-01-29 DIAGNOSIS — G629 Polyneuropathy, unspecified: Secondary | ICD-10-CM | POA: Diagnosis not present

## 2024-01-29 DIAGNOSIS — Z6833 Body mass index (BMI) 33.0-33.9, adult: Secondary | ICD-10-CM | POA: Insufficient documentation

## 2024-01-29 DIAGNOSIS — I5022 Chronic systolic (congestive) heart failure: Secondary | ICD-10-CM | POA: Diagnosis not present

## 2024-01-29 DIAGNOSIS — E669 Obesity, unspecified: Secondary | ICD-10-CM | POA: Insufficient documentation

## 2024-01-29 DIAGNOSIS — I480 Paroxysmal atrial fibrillation: Secondary | ICD-10-CM | POA: Insufficient documentation

## 2024-01-29 DIAGNOSIS — I493 Ventricular premature depolarization: Secondary | ICD-10-CM | POA: Diagnosis not present

## 2024-01-29 DIAGNOSIS — I428 Other cardiomyopathies: Secondary | ICD-10-CM | POA: Diagnosis not present

## 2024-01-29 DIAGNOSIS — Z905 Acquired absence of kidney: Secondary | ICD-10-CM | POA: Insufficient documentation

## 2024-01-29 DIAGNOSIS — R531 Weakness: Secondary | ICD-10-CM | POA: Diagnosis not present

## 2024-01-29 DIAGNOSIS — Z79899 Other long term (current) drug therapy: Secondary | ICD-10-CM | POA: Insufficient documentation

## 2024-01-29 DIAGNOSIS — Z87440 Personal history of urinary (tract) infections: Secondary | ICD-10-CM | POA: Diagnosis not present

## 2024-01-29 DIAGNOSIS — I5020 Unspecified systolic (congestive) heart failure: Secondary | ICD-10-CM | POA: Diagnosis not present

## 2024-01-29 DIAGNOSIS — I2510 Atherosclerotic heart disease of native coronary artery without angina pectoris: Secondary | ICD-10-CM | POA: Insufficient documentation

## 2024-01-29 DIAGNOSIS — Z9884 Bariatric surgery status: Secondary | ICD-10-CM | POA: Insufficient documentation

## 2024-01-29 DIAGNOSIS — Z993 Dependence on wheelchair: Secondary | ICD-10-CM | POA: Diagnosis not present

## 2024-01-29 MED ORDER — FUROSEMIDE 40 MG PO TABS: 20.0000 mg | ORAL_TABLET | Freq: Every day | ORAL | 1 refills | Status: AC | PRN

## 2024-01-29 NOTE — Patient Instructions (Signed)
 Medication Changes:  STOP TAKING LASIX  DAILY   SWITCH TO LASIX  20MG  ONCE DAILY ONLY AS NEEDED FOR SWELLING OR WEIGHT GAIN OF 3 POUNDS OVERNIGHT OR 5  POUNDS IN ONE WEEK   STOP POTASSIUM   STOP JARDIANCE   Referrals:  YOU HAVE BEEN REFERRED TO PHARM THEY WILL REACH OUT TO YOU OR CALL TO ARRANGE THIS. PLEASE CALL US  WITH ANY CONCERNS  Follow-Up in: 1 YEAR PLEASE CALL OUR OFFICE AROUND JUNE OF 2026 TO GET SCHEDULED FOR YOUR APPOINTMENT. PHONE NUMBER IS [phone] OPTION 2   At the [facility], you and your health needs are our priority. We have a designated team specialized in the treatment of Heart Failure. This Care Team includes your primary Heart Failure Specialized Cardiologist (physician), Advanced Practice Providers (APPs- Physician Assistants and Nurse Practitioners), and Pharmacist who all work together to provide you with the care you need, when you need it.   You may see any of the following providers on your designated Care Team at your next follow up:  Dr. Toribio Fuel Dr. Ezra Shuck Dr. Ria Commander Dr. Odis Brownie Greig Mosses, NP [place]affie Shed, [place] [facility] [place], [place] Beckey Coe, NP [place] Lee, NP Tinnie Redman, PharmD   Please be sure to bring in all your medications bottles to every appointment.   Need to Contact Us :  If you have any questions or concerns before your next appointment please send us  a message through [place] or call our office at [phone].    TO LEAVE A MESSAGE FOR THE NURSE SELECT OPTION 2, PLEASE LEAVE A MESSAGE INCLUDING: YOUR NAME DATE OF BIRTH CALL BACK NUMBER REASON FOR CALL**this is important as we prioritize the call backs  YOU WILL RECEIVE A CALL BACK THE SAME DAY AS LONG AS YOU CALL BEFORE 4:00 PM

## 2024-02-06 ENCOUNTER — Encounter: Payer: Self-pay | Admitting: Advanced Practice Midwife

## 2024-02-12 DIAGNOSIS — R262 Difficulty in walking, not elsewhere classified: Secondary | ICD-10-CM | POA: Diagnosis not present

## 2024-02-13 ENCOUNTER — Other Ambulatory Visit (HOSPITAL_COMMUNITY): Payer: Self-pay | Admitting: Cardiology

## 2024-02-16 ENCOUNTER — Other Ambulatory Visit: Payer: Self-pay | Admitting: Internal Medicine

## 2024-02-16 DIAGNOSIS — F900 Attention-deficit hyperactivity disorder, predominantly inattentive type: Secondary | ICD-10-CM

## 2024-02-16 DIAGNOSIS — G479 Sleep disorder, unspecified: Secondary | ICD-10-CM

## 2024-02-16 NOTE — Telephone Encounter (Unsigned)
 Copied from CRM [id number]. Topic: Clinical - Medication Refill >> Feb 16, 2024  5:02 PM Deleta S wrote: Medication: ADDERALL  20 MG , ADDERALL  30 MG Has the patient contacted their pharmacy? Yes  (Agent: If no, request that the patient contact the pharmacy for the refill. If patient does not wish to contact the pharmacy document the reason why and proceed with request.) (Agent: If yes, when and what did the pharmacy advise?)  This is the patient's preferred pharmacy:  [facility] #[id number] [place], [place] - [address] AT [facility] [address] [facility] [address] & [facility] [address] [place] [address] Phone: [phone] Fax: [phone]   Is this the correct pharmacy for this prescription? Yes If no, delete pharmacy and type the correct one.   Has the prescription been filled recently? Yes  Is the patient out of the medication? Yes  Has the patient been seen for an appointment in the last year OR does the patient have an upcoming appointment? Yes  Can we respond through MyChart? Yes  Agent: Please be advised that Rx refills may take up to 3 business days. We ask that you follow-up with your pharmacy.

## 2024-02-17 MED ORDER — AMPHETAMINE-DEXTROAMPHET ER 30 MG PO CP24: 30.0000 mg | ORAL_CAPSULE | Freq: Every day | ORAL | 0 refills | Status: DC

## 2024-02-17 MED ORDER — AMPHETAMINE-DEXTROAMPHETAMINE 20 MG PO TABS: 20.0000 mg | ORAL_TABLET | Freq: Every day | ORAL | 0 refills | Status: DC

## 2024-02-19 ENCOUNTER — Telehealth: Payer: Self-pay | Admitting: *Deleted

## 2024-02-19 DIAGNOSIS — R269 Unspecified abnormalities of gait and mobility: Secondary | ICD-10-CM

## 2024-02-19 DIAGNOSIS — I5022 Chronic systolic (congestive) heart failure: Secondary | ICD-10-CM

## 2024-02-19 DIAGNOSIS — G8929 Other chronic pain: Secondary | ICD-10-CM

## 2024-02-19 DIAGNOSIS — I4819 Other persistent atrial fibrillation: Secondary | ICD-10-CM

## 2024-02-19 NOTE — Telephone Encounter (Signed)
 Copied from CRM [id number]. Topic: General - Other >> Feb 19, 2024  4:02 PM Chiquita SQUIBB wrote: Reason for CRM: Warrick with [facility] Benefits department is calling in with the patient requesting home care services with [facility]. Please advise the patient.

## 2024-02-19 NOTE — Telephone Encounter (Signed)
 Referral placed.

## 2024-02-20 ENCOUNTER — Other Ambulatory Visit (HOSPITAL_COMMUNITY): Payer: Self-pay

## 2024-02-20 ENCOUNTER — Telehealth: Payer: Self-pay

## 2024-02-20 NOTE — Telephone Encounter (Signed)
 Pharmacy Patient Advocate Encounter   Received notification from Onbase that prior authorization for Amphetamine -Dextroamphetamine  20MG  tablets is required/requested.   Insurance verification completed.   The patient is insured through [employer] .   Per test claim: PA required; PA submitted to above mentioned insurance via Fax Key/confirmation #/EOC [id number] Status is pending   FAX: [phone] PHONE: [phone]

## 2024-02-26 ENCOUNTER — Other Ambulatory Visit (HOSPITAL_COMMUNITY): Payer: Self-pay

## 2024-02-26 DIAGNOSIS — D649 Anemia, unspecified: Secondary | ICD-10-CM | POA: Diagnosis not present

## 2024-02-26 DIAGNOSIS — I5022 Chronic systolic (congestive) heart failure: Secondary | ICD-10-CM

## 2024-02-26 DIAGNOSIS — I110 Hypertensive heart disease with heart failure: Secondary | ICD-10-CM | POA: Diagnosis not present

## 2024-02-26 DIAGNOSIS — G629 Polyneuropathy, unspecified: Secondary | ICD-10-CM | POA: Diagnosis not present

## 2024-02-26 DIAGNOSIS — F411 Generalized anxiety disorder: Secondary | ICD-10-CM | POA: Diagnosis not present

## 2024-02-26 DIAGNOSIS — M48061 Spinal stenosis, lumbar region without neurogenic claudication: Secondary | ICD-10-CM | POA: Diagnosis not present

## 2024-02-26 DIAGNOSIS — I480 Paroxysmal atrial fibrillation: Secondary | ICD-10-CM | POA: Diagnosis not present

## 2024-02-26 DIAGNOSIS — I2510 Atherosclerotic heart disease of native coronary artery without angina pectoris: Secondary | ICD-10-CM | POA: Diagnosis not present

## 2024-02-26 MED ORDER — SPIRONOLACTONE 25 MG PO TABS
12.5000 mg | ORAL_TABLET | Freq: Every day | ORAL | 5 refills | Status: AC
Start: 2024-02-26 — End: ?

## 2024-02-27 ENCOUNTER — Other Ambulatory Visit (HOSPITAL_COMMUNITY): Payer: Self-pay

## 2024-02-27 NOTE — Telephone Encounter (Signed)
 Pharmacy Patient Advocate Encounter  Received notification from [employer] that Prior Authorization for Amphetamine -Dextroamphetamine  20MG  tablets  has been APPROVED from 02/20/24 to 07/21/24. Ran test claim, Copay is $0. This test claim was processed through [facility]- copay amounts may vary at other pharmacies due to pharmacy/plan contracts, or as the patient moves through the different stages of their insurance plan.   PA #/Case ID/Reference #: [id number]

## 2024-02-28 ENCOUNTER — Telehealth: Admitting: Physician Assistant

## 2024-02-28 DIAGNOSIS — N390 Urinary tract infection, site not specified: Secondary | ICD-10-CM

## 2024-02-28 NOTE — Progress Notes (Signed)
  Because of recurring UTI and active symptoms including confusion, and giving increased risk of complications and antibiotic resistance in those over 65, the standard of care if for an exam and urine culture to guide treatment. As such, I feel your condition warrants further evaluation and I recommend that you be seen in a face-to-face visit.   NOTE: There will be NO CHARGE for this E-Visit   If you are having a true medical emergency, please call 911.     For an urgent face to face visit, [facility] has multiple urgent care centers for your convenience.  Click the link below for the full list of locations and hours, walk-in wait times, appointment scheduling options and driving directions:  Urgent Care - [place], [place], [place], [place], [place], [place]  [place]     Your MyChart E-visit questionnaire answers were reviewed by a board certified advanced clinical practitioner to complete your personal care plan based on your specific symptoms.    Thank you for using e-Visits.

## 2024-02-29 DIAGNOSIS — R3915 Urgency of urination: Secondary | ICD-10-CM | POA: Diagnosis not present

## 2024-02-29 DIAGNOSIS — R350 Frequency of micturition: Secondary | ICD-10-CM | POA: Diagnosis not present

## 2024-03-01 ENCOUNTER — Other Ambulatory Visit: Payer: Self-pay | Admitting: Internal Medicine

## 2024-03-01 ENCOUNTER — Telehealth: Payer: Self-pay

## 2024-03-01 DIAGNOSIS — G479 Sleep disorder, unspecified: Secondary | ICD-10-CM

## 2024-03-01 NOTE — Telephone Encounter (Signed)
 error

## 2024-03-03 ENCOUNTER — Other Ambulatory Visit (HOSPITAL_COMMUNITY): Payer: Self-pay

## 2024-03-08 ENCOUNTER — Other Ambulatory Visit (HOSPITAL_COMMUNITY): Payer: Self-pay

## 2024-03-09 ENCOUNTER — Other Ambulatory Visit (HOSPITAL_COMMUNITY): Payer: Self-pay | Admitting: Cardiology

## 2024-03-09 ENCOUNTER — Other Ambulatory Visit: Payer: Self-pay | Admitting: Internal Medicine

## 2024-03-09 DIAGNOSIS — F339 Major depressive disorder, recurrent, unspecified: Secondary | ICD-10-CM

## 2024-03-11 ENCOUNTER — Ambulatory Visit: Admitting: Internal Medicine

## 2024-03-15 ENCOUNTER — Encounter: Payer: Self-pay | Admitting: Internal Medicine

## 2024-03-15 ENCOUNTER — Ambulatory Visit (INDEPENDENT_AMBULATORY_CARE_PROVIDER_SITE_OTHER): Admitting: Internal Medicine

## 2024-03-15 VITALS — BP 110/78 | HR 56 | Temp 98.3°F | Wt 198.3 lb

## 2024-03-15 DIAGNOSIS — S91105S Unspecified open wound of left lesser toe(s) without damage to nail, sequela: Secondary | ICD-10-CM

## 2024-03-15 DIAGNOSIS — M2042 Other hammer toe(s) (acquired), left foot: Secondary | ICD-10-CM | POA: Diagnosis not present

## 2024-03-15 NOTE — Progress Notes (Signed)
 Established Patient Office Visit     CC/Reason for Visit: Left second toe wound  HPI: Leslie [name] is a 73 y.o. female who is coming in today for the above mentioned reasons.  She has been dealing with this for over 6 months.  She has bilateral second toe hammertoes.  On the left side she has a scab over that hammertoe due to friction.  She also has significant bilateral chronic venous insufficiency.   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   Colon polyps    Depression 1995   Dysrhythmia    A-fib   GERD (gastroesophageal reflux disease) 2020   Hypertension    Left knee pain    chronic   Narcotic addiction (HCC)    Neuromuscular disorder (HCC) 2013   Shingles   Neuropathy    bilateral feet   Obesity    post gastric bypass   Spinal stenosis    Vitamin D  deficiency     Past Surgical History:  Procedure Laterality Date   APPENDECTOMY  1980   arthscopic knee surgery Left    several times   ATRIAL FIBRILLATION ABLATION N/A 04/11/2023   Procedure: ATRIAL FIBRILLATION ABLATION;  Surgeon: Nancey Eulas BRAVO, MD;  Location: [facility];  Service: Cardiovascular;  Laterality: N/A;   BREAST BIOPSY     BREAST SURGERY  years ago   breast biopsy   CHOLECYSTECTOMY     EYE SURGERY Bilateral 2013   Toric Lens implants   EYE SURGERY Right 2014   corneal amniotic membrane   FORAMINOTOMY 2 LEVEL N/A 10/14/2023   Procedure: Posterior Lumbar Decompression Lumbar four-lumbar five Lumbar five -Lumbar Sacral one;  Surgeon: Debby Dorn MATSU, MD;  Location: [facility];  Service: Neurosurgery;  Laterality: N/A;   GASTRIC BYPASS  2003   IR SACROPLASTY BILATERAL  01/07/2023   JOINT REPLACEMENT  2016   Left knee   KIDNEY DONATION  2004    ROTATOR CUFF REPAIR Right    TOTAL KNEE ARTHROPLASTY Left 06/16/2015   Procedure: LEFT TOTAL KNEE ARTHROPLASTY;  Surgeon: Lonni CINDERELLA Poli, MD;  Location: [facility];  Service: Orthopedics;  Laterality: Left;   TOTAL KNEE ARTHROPLASTY Right 05/04/2021   Procedure: RIGHT TOTAL KNEE ARTHROPLASTY;  Surgeon: Poli Lonni CINDERELLA, MD;  Location: [facility];  Service: Orthopedics;  Laterality: Right;    Social History:  reports that she quit smoking about 31 years ago. Her smoking use included cigarettes. She has never used smokeless tobacco. She reports current alcohol  use. She reports that she does not use drugs.  Allergies: Allergies  Allergen Reactions   Amoxicillin Hives   Misc. Sulfonamide Containing Compounds    Nsaids     Due Gastric Bypass   Sulfamethoxazole Nausea And Vomiting    See patient list of med intolerances due to h/o gastric bypass and nephrectomy    Family History:  Family History  Problem Relation Age of Onset   Heart disease Mother        post CABG history of CHF   COPD Father    Diabetes Sister    Hypertension Sister        post renal transplant   Heart disease Brother  CAD   Stomach cancer Neg Hx    Pancreatic cancer Neg Hx    Colon cancer Neg Hx    Esophageal cancer Neg Hx      Current Outpatient Medications:    ALPRAZolam  (XANAX ) 0.25 MG tablet, TAKE 1 TABLET(0.25 MG) BY MOUTH AT BEDTIME AS NEEDED FOR ANXIETY, Disp: 30 tablet, Rfl: 0   amphetamine -dextroamphetamine  (ADDERALL  XR) 30 MG 24 hr capsule, Take 1 capsule (30 mg total) by mouth daily., Disp: 30 capsule, Rfl: 0   amphetamine -dextroamphetamine  (ADDERALL  XR) 30 MG 24 hr capsule, Take 1 capsule (30 mg total) by mouth daily., Disp: 30 capsule, Rfl: 0   amphetamine -dextroamphetamine  (ADDERALL  XR) 30 MG 24 hr capsule, Take 1 capsule (30 mg total) by mouth daily., Disp: 30 capsule, Rfl: 0   amphetamine -dextroamphetamine  (ADDERALL ) 20 MG tablet, Take 1 tablet (20 mg total) by mouth daily., Disp:  30 tablet, Rfl: 0   amphetamine -dextroamphetamine  (ADDERALL ) 20 MG tablet, Take 1 tablet (20 mg total) by mouth daily., Disp: 30 tablet, Rfl: 0   amphetamine -dextroamphetamine  (ADDERALL ) 20 MG tablet, Take 1 tablet (20 mg total) by mouth daily., Disp: 30 tablet, Rfl: 0   apixaban  (ELIQUIS ) 5 MG TABS tablet, TAKE 1 TABLET(5 MG) BY MOUTH TWICE DAILY, Disp: 180 tablet, Rfl: 1   atorvastatin  (LIPITOR) 40 MG tablet, Take 1 tablet (40 mg total) by mouth daily. Take 40 mg by mouth daily.Please schedule an appointment for further refills, Disp: 30 tablet, Rfl: 1   Cholecalciferol (VITAMIN D3) 50 MCG (2000 UT) TABS, Take 2,000 Units by mouth daily., Disp: , Rfl:    FLUoxetine  (PROZAC ) 40 MG capsule, TAKE 1 CAPSULE(40 MG) BY MOUTH EVERY MORNING, Disp: 90 capsule, Rfl: 1   furosemide  (LASIX ) 40 MG tablet, Take 0.5 tablets (20 mg total) by mouth daily as needed for edema or fluid., Disp: 30 tablet, Rfl: 1   gabapentin  (NEURONTIN ) 300 MG capsule, TAKE 1 CAPSULE BY MOUTH THREE TIMES DAILY, Disp: 90 capsule, Rfl: 2   lamoTRIgine  (LAMICTAL ) 100 MG tablet, TAKE 1 TABLET(100 MG) BY MOUTH TWICE DAILY, Disp: 180 tablet, Rfl: 1   losartan  (COZAAR ) 25 MG tablet, TAKE 1/2 TABLET(12.5 MG) BY MOUTH DAILY (Patient taking differently: Take 25 mg by mouth daily.), Disp: 45 tablet, Rfl: 3   melatonin 3 MG TABS tablet, Take 1 tablet (3 mg total) by mouth at bedtime., Disp: , Rfl: 0   metoprolol  succinate (TOPROL -XL) 50 MG 24 hr tablet, TAKE 1 TABLET(50 MG) BY MOUTH AT BEDTIME WITH OR IMMEDIATELY FOLLOWING A MEAL, Disp: 90 tablet, Rfl: 4   Multiple Vitamins-Minerals (PRESERVISION AREDS 2) CAPS, Take 1 capsule by mouth 2 (two) times daily., Disp: , Rfl:    spironolactone  (ALDACTONE ) 25 MG tablet, Take 0.5 tablets (12.5 mg total) by mouth daily., Disp: 30 tablet, Rfl: 5   zolpidem  (AMBIEN ) 5 MG tablet, TAKE 1 TABLET(5 MG) BY MOUTH AT BEDTIME AS NEEDED FOR SLEEP, Disp: 30 tablet, Rfl: 0   Semaglutide -Weight Management (WEGOVY ) 0.25  MG/0.5ML SOAJ, Inject 0.25 mg into the skin once a week. (Patient not taking: Reported on 01/29/2024), Disp: 2 mL, Rfl: 0  Review of Systems:  Negative unless indicated in HPI.   Physical Exam: Vitals:   03/15/24 1113  BP: 110/78  Pulse: (!) 56  Temp: 98.3 F (36.8 C)  TempSrc: Oral  SpO2: 93%  Weight: 198 lb 4.8 oz (89.9 kg)    Body mass index is 34.04 kg/m.   Physical Exam Cardiovascular:     Pulses:  Dorsalis pedis pulses are 2+ on the right side and 2+ on the left side.       Posterior tibial pulses are 2+ on the right side and 2+ on the left side.  Feet:     Right foot:     Toenail Condition: Right toenails are normal.     Left foot:     Toenail Condition: Left toenails are normal.     Comments: Bilateral second toe hammertoes.  On the left small scabbed over wound over that hammertoe.     Impression and Plan:  Acquired hammer toe of left foot  Unspecified open wound of left lesser toe(s) without damage to nail, sequela   - Advised use of Vaseline over scab, cushions when wearing shoes as well as purchasing shoes with a wider toebox to prevent friction.  Time spent:22 minutes reviewing chart, interviewing and examining patient and formulating plan of care.     Tully Theophilus Andrews, MD [facility]

## 2024-03-17 ENCOUNTER — Telehealth: Payer: Self-pay | Admitting: *Deleted

## 2024-03-17 ENCOUNTER — Other Ambulatory Visit (HOSPITAL_COMMUNITY): Payer: Self-pay

## 2024-03-17 NOTE — Telephone Encounter (Signed)
-----   Message from Leslie [name] sent at 03/17/2024  6:49 AM EDT ----- To prior auth team? ----- Message ----- From: Graylon Penne SAUNDERS Sent: 03/16/2024   3:26 PM EDT To: Leslie CINDERELLA Theophilus Andrews, MD

## 2024-03-18 ENCOUNTER — Telehealth: Payer: Self-pay | Admitting: *Deleted

## 2024-03-18 ENCOUNTER — Other Ambulatory Visit: Payer: Self-pay | Admitting: *Deleted

## 2024-03-18 DIAGNOSIS — F900 Attention-deficit hyperactivity disorder, predominantly inattentive type: Secondary | ICD-10-CM

## 2024-03-18 MED ORDER — AMPHETAMINE-DEXTROAMPHET ER 30 MG PO CP24: 30.0000 mg | ORAL_CAPSULE | Freq: Every day | ORAL | 0 refills | Status: DC

## 2024-03-18 MED ORDER — AMPHETAMINE-DEXTROAMPHET ER 30 MG PO CP24: 30.0000 mg | ORAL_CAPSULE | Freq: Every day | ORAL | 0 refills | Status: AC

## 2024-03-18 MED ORDER — AMPHETAMINE-DEXTROAMPHETAMINE 20 MG PO TABS: 20.0000 mg | ORAL_TABLET | Freq: Every day | ORAL | 0 refills | Status: DC

## 2024-03-18 MED ORDER — AMPHETAMINE-DEXTROAMPHETAMINE 20 MG PO TABS
20.0000 mg | ORAL_TABLET | Freq: Every day | ORAL | 0 refills | Status: AC
Start: 2024-03-18 — End: ?

## 2024-03-18 NOTE — Telephone Encounter (Signed)
 Copied from CRM [id number]. Topic: Clinical - Home Health Verbal Orders >> Mar 18, 2024  2:23 PM Ivette P wrote: Caller/Agency: [facility] - [facility]  Callback Number: [phone] - secured line  Service Requested: Physical Therapy Any new concerns about the patient? Yes, pt not feeling well, requesting to move physical therapy to next week.   Need verbal  approval

## 2024-03-18 NOTE — Telephone Encounter (Signed)
 Copied from CRM [id number]. Topic: Clinical - Prescription Issue >> Mar 18, 2024  2:28 PM Larissa S wrote: Reason for CRM: Patient states her medication, amphetamine -dextroamphetamine  (ADDERALL  XR) 30 MG 24 hr capsule, is out of stock at her preferred pharmacy. She states medication is in stock and would like to have prescription sent to  [facility] [address]  [place], [place] [address]  [phone]   ----------------------------------------------------------------------- From previous Reason for Contact - Other: Reason for CRM:

## 2024-03-18 NOTE — Telephone Encounter (Signed)
 Leslie [name] - [facility] is aware

## 2024-03-23 ENCOUNTER — Other Ambulatory Visit: Payer: Self-pay | Admitting: Internal Medicine

## 2024-03-23 DIAGNOSIS — G629 Polyneuropathy, unspecified: Secondary | ICD-10-CM | POA: Diagnosis not present

## 2024-03-23 DIAGNOSIS — I2510 Atherosclerotic heart disease of native coronary artery without angina pectoris: Secondary | ICD-10-CM | POA: Diagnosis not present

## 2024-03-23 DIAGNOSIS — I110 Hypertensive heart disease with heart failure: Secondary | ICD-10-CM | POA: Diagnosis not present

## 2024-03-23 DIAGNOSIS — F411 Generalized anxiety disorder: Secondary | ICD-10-CM | POA: Diagnosis not present

## 2024-03-23 DIAGNOSIS — M48061 Spinal stenosis, lumbar region without neurogenic claudication: Secondary | ICD-10-CM | POA: Diagnosis not present

## 2024-03-23 DIAGNOSIS — I480 Paroxysmal atrial fibrillation: Secondary | ICD-10-CM | POA: Diagnosis not present

## 2024-03-23 DIAGNOSIS — I5022 Chronic systolic (congestive) heart failure: Secondary | ICD-10-CM | POA: Diagnosis not present

## 2024-03-23 DIAGNOSIS — D649 Anemia, unspecified: Secondary | ICD-10-CM | POA: Diagnosis not present

## 2024-03-23 DIAGNOSIS — G479 Sleep disorder, unspecified: Secondary | ICD-10-CM

## 2024-04-08 ENCOUNTER — Ambulatory Visit: Admitting: Pharmacist

## 2024-04-08 ENCOUNTER — Other Ambulatory Visit: Payer: Self-pay | Admitting: Internal Medicine

## 2024-04-08 DIAGNOSIS — G479 Sleep disorder, unspecified: Secondary | ICD-10-CM

## 2024-04-08 NOTE — Progress Notes (Deleted)
 Patient ID: Leslie [name]                 DOB: 12-Jun-1951                    MRN: [id number]     HPI: Leslie [name] is a 73 y.o. female patient referred to pharmacy clinic by Greig Mosses, NP to initiate GLP1-RA therapy. PMH is significant for obesity, HTN, HFrEF, Afib and osteoarthritis. Most recent BMI 34 kg/m .  **Make sure you weigh her Med hx has wegovy  on file (11/2023); did you discontinue? Why? Dose? Notice weight loss? Wegovy  and zepbound  Baseline weight and BMI: 182 lbs and 31 kg/m  Current weight and BMI: 198 lbs and 34 kg/m  Current meds that affect weight: fluoxetine , metoprolol , lamotrigine    *** Follow-up visit  Assess % weight loss Assess adverse effects Missed doses  Diet:   Exercise:   Family History:  Relation Problem Comments  Mother - Euell Plaza (Deceased at age 28) Heart disease post CABG history of CHF    Father - Marinell Plaza (Deceased at age 33) COPD     Sister - Bascom Gilding Diabetes   Hypertension post renal transplant    Brother - Jerel Wyrick Heart disease CAD    Neg Hx Colon cancer   Esophageal cancer   Pancreatic cancer   Stomach cancer        Social History:  Alcohol : Smoking:  Labs: Lab Results  Component Value Date   HGBA1C 5.3 03/13/2021    Wt Readings from Last 1 Encounters:  03/15/24 198 lb 4.8 oz (89.9 kg)    BP Readings from Last 1 Encounters:  03/15/24 110/78   Pulse Readings from Last 1 Encounters:  03/15/24 (!) 56       Component Value Date/Time   CHOL 217 (H) 03/13/2021 1501   TRIG 46.0 03/13/2021 1501   HDL 112.30 03/13/2021 1501   CHOLHDL 2 03/13/2021 1501   VLDL 9.2 03/13/2021 1501   LDLCALC 95 03/13/2021 1501   LDLCALC 109 (H) 02/16/2020 1019   LDLDIRECT 109.1 07/27/2008 0942    Past Medical History:  Diagnosis Date   ADD (attention deficit disorder)    Allergy    Anemia    Anxiety 2020   Arthritis 07/22/2014   osteoarthritis left knee   Cataract 2013   Chronic kidney  disease 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   Colon polyps    Depression 1995   Dysrhythmia    A-fib   GERD (gastroesophageal reflux disease) 2020   Hypertension    Left knee pain    chronic   Narcotic addiction (HCC)    Neuromuscular disorder (HCC) 2013   Shingles   Neuropathy    bilateral feet   Obesity    post gastric bypass   Spinal stenosis    Vitamin D  deficiency     Current Outpatient Medications on File Prior to Visit  Medication Sig Dispense Refill   ALPRAZolam  (XANAX ) 0.25 MG tablet TAKE 1 TABLET(0.25 MG) BY MOUTH AT BEDTIME AS NEEDED FOR ANXIETY 30 tablet 0   amphetamine -dextroamphetamine  (ADDERALL  XR) 30 MG 24 hr capsule Take 1 capsule (30 mg total) by mouth daily. 30 capsule 0   amphetamine -dextroamphetamine  (ADDERALL  XR) 30 MG 24 hr capsule Take 1 capsule (30 mg total) by mouth daily. 30 capsule 0   amphetamine -dextroamphetamine  (ADDERALL  XR) 30 MG 24 hr capsule Take 1 capsule (30 mg total) by mouth daily. 30  capsule 0   amphetamine -dextroamphetamine  (ADDERALL ) 20 MG tablet Take 1 tablet (20 mg total) by mouth daily. 30 tablet 0   amphetamine -dextroamphetamine  (ADDERALL ) 20 MG tablet Take 1 tablet (20 mg total) by mouth daily. 30 tablet 0   amphetamine -dextroamphetamine  (ADDERALL ) 20 MG tablet Take 1 tablet (20 mg total) by mouth daily. 30 tablet 0   apixaban  (ELIQUIS ) 5 MG TABS tablet TAKE 1 TABLET(5 MG) BY MOUTH TWICE DAILY 180 tablet 1   atorvastatin  (LIPITOR) 40 MG tablet Take 1 tablet (40 mg total) by mouth daily. Take 40 mg by mouth daily.Please schedule an appointment for further refills 30 tablet 1   Cholecalciferol (VITAMIN D3) 50 MCG (2000 UT) TABS Take 2,000 Units by mouth daily.     FLUoxetine  (PROZAC ) 40 MG capsule TAKE 1 CAPSULE(40 MG) BY MOUTH EVERY MORNING 90 capsule 1   furosemide  (LASIX ) 40 MG tablet Take 0.5 tablets (20 mg total) by mouth daily as needed for edema or fluid. 30 tablet 1   gabapentin  (NEURONTIN ) 300 MG capsule TAKE 1 CAPSULE BY MOUTH  THREE TIMES DAILY 90 capsule 2   lamoTRIgine  (LAMICTAL ) 100 MG tablet TAKE 1 TABLET(100 MG) BY MOUTH TWICE DAILY 180 tablet 1   losartan  (COZAAR ) 25 MG tablet TAKE 1/2 TABLET(12.5 MG) BY MOUTH DAILY (Patient taking differently: Take 25 mg by mouth daily.) 45 tablet 3   melatonin 3 MG TABS tablet Take 1 tablet (3 mg total) by mouth at bedtime.  0   metoprolol  succinate (TOPROL -XL) 50 MG 24 hr tablet TAKE 1 TABLET(50 MG) BY MOUTH AT BEDTIME WITH OR IMMEDIATELY FOLLOWING A MEAL 90 tablet 4   Multiple Vitamins-Minerals (PRESERVISION AREDS 2) CAPS Take 1 capsule by mouth 2 (two) times daily.     Semaglutide -Weight Management (WEGOVY ) 0.25 MG/0.5ML SOAJ Inject 0.25 mg into the skin once a week. (Patient not taking: Reported on 01/29/2024) 2 mL 0   spironolactone  (ALDACTONE ) 25 MG tablet Take 0.5 tablets (12.5 mg total) by mouth daily. 30 tablet 5   zolpidem  (AMBIEN ) 5 MG tablet TAKE 1 TABLET(5 MG) BY MOUTH AT BEDTIME AS NEEDED FOR SLEEP 30 tablet 1   No current facility-administered medications on file prior to visit.    Allergies  Allergen Reactions   Amoxicillin Hives   Misc. Sulfonamide Containing Compounds    Nsaids     Due Gastric Bypass   Sulfamethoxazole Nausea And Vomiting    See patient list of med intolerances due to h/o gastric bypass and nephrectomy     Assessment/Plan:  1. Weight loss - Patient has not met goal of at least 5% of body weight loss with comprehensive lifestyle modifications alone in the past 3-6 months. Pharmacotherapy is appropriate to pursue as augmentation. Will start*** . Confirmed patient not ***pregnant and no personal or family history of medullary thyroid  carcinoma (MTC) or Multiple Endocrine Neoplasia syndrome type 2 (MEN 2). Injection technique reviewed at today's visit.  Advised patient on common side effects including nausea, diarrhea, dyspepsia, decreased appetite, and fatigue. Counseled patient on reducing meal size and how to titrate medication to  minimize side effects. Counseled patient to call if intolerable side effects or if experiencing dehydration, abdominal pain, or dizziness. Along with pharmacotherapy, the patient will follow dietary modifications and aim for at least 150 minutes of moderate-intensity exercise per week, plus resistance training twice a week (as recommended by the American Heart Association). This resistance training--such as weightlifting, bodyweight exercises, or using resistance bands, adapted to the patient's ability--will help prevent muscle loss.  Follow up in 1-2 days regarding coverage of *** . If therapy is initiated, phone follow-ups will be conducted every 4 weeks for dose titration until the patient reaches the effective therapeutic dose and target weight.  Ajwa Kimberley E. Aizley Stenseth, Pharm.D [place] [facility]. [facility] [address], [place], [place] [address] Phone: [phone]; Fax: [phone]

## 2024-04-09 DIAGNOSIS — M48061 Spinal stenosis, lumbar region without neurogenic claudication: Secondary | ICD-10-CM | POA: Diagnosis not present

## 2024-04-09 DIAGNOSIS — I110 Hypertensive heart disease with heart failure: Secondary | ICD-10-CM | POA: Diagnosis not present

## 2024-04-09 DIAGNOSIS — D649 Anemia, unspecified: Secondary | ICD-10-CM | POA: Diagnosis not present

## 2024-04-09 DIAGNOSIS — I5022 Chronic systolic (congestive) heart failure: Secondary | ICD-10-CM | POA: Diagnosis not present

## 2024-04-09 DIAGNOSIS — G629 Polyneuropathy, unspecified: Secondary | ICD-10-CM | POA: Diagnosis not present

## 2024-04-09 DIAGNOSIS — I2510 Atherosclerotic heart disease of native coronary artery without angina pectoris: Secondary | ICD-10-CM | POA: Diagnosis not present

## 2024-04-09 DIAGNOSIS — I480 Paroxysmal atrial fibrillation: Secondary | ICD-10-CM | POA: Diagnosis not present

## 2024-04-09 DIAGNOSIS — F411 Generalized anxiety disorder: Secondary | ICD-10-CM | POA: Diagnosis not present

## 2024-04-14 ENCOUNTER — Ambulatory Visit: Attending: Cardiology | Admitting: Pharmacist

## 2024-04-14 ENCOUNTER — Encounter: Payer: Self-pay | Admitting: Pharmacist

## 2024-04-14 VITALS — Wt 198.0 lb

## 2024-04-14 DIAGNOSIS — D649 Anemia, unspecified: Secondary | ICD-10-CM | POA: Diagnosis not present

## 2024-04-14 DIAGNOSIS — Z9884 Bariatric surgery status: Secondary | ICD-10-CM

## 2024-04-14 DIAGNOSIS — I110 Hypertensive heart disease with heart failure: Secondary | ICD-10-CM | POA: Diagnosis not present

## 2024-04-14 DIAGNOSIS — I5022 Chronic systolic (congestive) heart failure: Secondary | ICD-10-CM | POA: Diagnosis not present

## 2024-04-14 DIAGNOSIS — F411 Generalized anxiety disorder: Secondary | ICD-10-CM | POA: Diagnosis not present

## 2024-04-14 DIAGNOSIS — M48061 Spinal stenosis, lumbar region without neurogenic claudication: Secondary | ICD-10-CM | POA: Diagnosis not present

## 2024-04-14 DIAGNOSIS — I480 Paroxysmal atrial fibrillation: Secondary | ICD-10-CM | POA: Diagnosis not present

## 2024-04-14 DIAGNOSIS — G629 Polyneuropathy, unspecified: Secondary | ICD-10-CM | POA: Diagnosis not present

## 2024-04-14 DIAGNOSIS — Z6833 Body mass index (BMI) 33.0-33.9, adult: Secondary | ICD-10-CM

## 2024-04-14 DIAGNOSIS — I2510 Atherosclerotic heart disease of native coronary artery without angina pectoris: Secondary | ICD-10-CM | POA: Diagnosis not present

## 2024-04-14 NOTE — Progress Notes (Signed)
 Patient ID: Leslie [name]                 DOB: 17-Aug-1950                    MRN: [id number]     HPI: Leslie [name] is a 73 y.o. female patient referred to pharmacy clinic by Greig Mosses, NP to initiate GLP1-RA therapy. PMH is significant for obesity, HTN, HFrEF, Afib and osteoarthritis. Most recent BMI 34 kg/m .    Baseline weight and BMI: 182 lbs and 31 kg/m  Current weight and BMI: 198 lbs and 34 kg/m  Current meds that affect weight: fluoxetine , metoprolol , lamotrigine     Diet:  Breakfast - none  Lunch- chicken - baked, grilled and once a week fried, sandwich or left over from dinner  Dinner- Eats out 4 night s per week  admits even when eats home need to change to more healthy options  Drink: diet coke- 2 per day and water - 1x 16 oz bottle  Snacks: crackers  with cheese, popcorn   Exercise: physical therapy - once a week   Family History:  Relation Problem Comments  Mother - Euell Plaza (Deceased at age 54) Heart disease post CABG history of CHF    Father - Marinell Plaza (Deceased at age 81) COPD     Sister - Bascom Gilding Diabetes   Hypertension post renal transplant    Brother - Jerel Wyrick Heart disease CAD    Neg Hx Colon cancer   Esophageal cancer   Pancreatic cancer   Stomach cancer        Social History:  Alcohol : 1 -3 glass per day Smoking: quit 25 years ago   Labs: Lab Results  Component Value Date   HGBA1C 5.3 03/13/2021    Wt Readings from Last 1 Encounters:  03/15/24 198 lb 4.8 oz (89.9 kg)    BP Readings from Last 1 Encounters:  03/15/24 110/78   Pulse Readings from Last 1 Encounters:  03/15/24 (!) 56       Component Value Date/Time   CHOL 217 (H) 03/13/2021 1501   TRIG 46.0 03/13/2021 1501   HDL 112.30 03/13/2021 1501   CHOLHDL 2 03/13/2021 1501   VLDL 9.2 03/13/2021 1501   LDLCALC 95 03/13/2021 1501   LDLCALC 109 (H) 02/16/2020 1019   LDLDIRECT 109.1 07/27/2008 0942    Past Medical History:  Diagnosis Date    ADD (attention deficit disorder)    Allergy    Anemia    Anxiety 2020   Arthritis 07/22/2014   osteoarthritis left knee   Cataract 2013   Chronic kidney disease 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   Colon polyps    Depression 1995   Dysrhythmia    A-fib   GERD (gastroesophageal reflux disease) 2020   Hypertension    Left knee pain    chronic   Narcotic addiction (HCC)    Neuromuscular disorder (HCC) 2013   Shingles   Neuropathy    bilateral feet   Obesity    post gastric bypass   Spinal stenosis    Vitamin D  deficiency     Current Outpatient Medications on File Prior to Visit  Medication Sig Dispense Refill   ALPRAZolam  (XANAX ) 0.25 MG tablet TAKE 1 TABLET(0.25 MG) BY MOUTH AT BEDTIME AS NEEDED FOR ANXIETY 30 tablet 0   amphetamine -dextroamphetamine  (ADDERALL  XR) 30 MG 24 hr capsule Take 1 capsule (30 mg total) by mouth daily.  30 capsule 0   amphetamine -dextroamphetamine  (ADDERALL  XR) 30 MG 24 hr capsule Take 1 capsule (30 mg total) by mouth daily. 30 capsule 0   amphetamine -dextroamphetamine  (ADDERALL  XR) 30 MG 24 hr capsule Take 1 capsule (30 mg total) by mouth daily. 30 capsule 0   amphetamine -dextroamphetamine  (ADDERALL ) 20 MG tablet Take 1 tablet (20 mg total) by mouth daily. 30 tablet 0   amphetamine -dextroamphetamine  (ADDERALL ) 20 MG tablet Take 1 tablet (20 mg total) by mouth daily. 30 tablet 0   amphetamine -dextroamphetamine  (ADDERALL ) 20 MG tablet Take 1 tablet (20 mg total) by mouth daily. 30 tablet 0   apixaban  (ELIQUIS ) 5 MG TABS tablet TAKE 1 TABLET(5 MG) BY MOUTH TWICE DAILY 180 tablet 1   atorvastatin  (LIPITOR) 40 MG tablet Take 1 tablet (40 mg total) by mouth daily. Take 40 mg by mouth daily.Please schedule an appointment for further refills 30 tablet 1   Cholecalciferol (VITAMIN D3) 50 MCG (2000 UT) TABS Take 2,000 Units by mouth daily.     FLUoxetine  (PROZAC ) 40 MG capsule TAKE 1 CAPSULE(40 MG) BY MOUTH EVERY MORNING 90 capsule 1   furosemide  (LASIX ) 40  MG tablet Take 0.5 tablets (20 mg total) by mouth daily as needed for edema or fluid. 30 tablet 1   gabapentin  (NEURONTIN ) 300 MG capsule TAKE 1 CAPSULE BY MOUTH THREE TIMES DAILY 90 capsule 2   lamoTRIgine  (LAMICTAL ) 100 MG tablet TAKE 1 TABLET(100 MG) BY MOUTH TWICE DAILY 180 tablet 1   losartan  (COZAAR ) 25 MG tablet TAKE 1/2 TABLET(12.5 MG) BY MOUTH DAILY (Patient taking differently: Take 25 mg by mouth daily.) 45 tablet 3   melatonin 3 MG TABS tablet Take 1 tablet (3 mg total) by mouth at bedtime.  0   metoprolol  succinate (TOPROL -XL) 50 MG 24 hr tablet TAKE 1 TABLET(50 MG) BY MOUTH AT BEDTIME WITH OR IMMEDIATELY FOLLOWING A MEAL 90 tablet 4   Multiple Vitamins-Minerals (PRESERVISION AREDS 2) CAPS Take 1 capsule by mouth 2 (two) times daily.     Semaglutide -Weight Management (WEGOVY ) 0.25 MG/0.5ML SOAJ Inject 0.25 mg into the skin once a week. (Patient not taking: Reported on 01/29/2024) 2 mL 0   spironolactone  (ALDACTONE ) 25 MG tablet Take 0.5 tablets (12.5 mg total) by mouth daily. 30 tablet 5   zolpidem  (AMBIEN ) 5 MG tablet TAKE 1 TABLET(5 MG) BY MOUTH AT BEDTIME AS NEEDED FOR SLEEP 30 tablet 1   No current facility-administered medications on file prior to visit.    Allergies  Allergen Reactions   Amoxicillin Hives   Misc. Sulfonamide Containing Compounds    Nsaids     Due Gastric Bypass   Sulfamethoxazole Nausea And Vomiting    See patient list of med intolerances due to h/o gastric bypass and nephrectomy     Assessment/Plan:  1. Weight loss - pt has hx of gastric bypass. Patient has not met goal of at least 5% of body weight loss with comprehensive lifestyle modifications alone in the past 3-6 months. Pharmacotherapy is appropriate to pursue as augmentation. Given no history of MI stroke and PAD insurance will not cover Wegovy . Patient is interested in getting sleep study done as Zepbound is covered for OSA. Daughter states she snores at night.  Mean while advised patient to  follow dietary modifications and start doing regular physical activity as per ability   if sleep study AHI indicates moderate to severe sleep apnea, will consider assessing coverage for Zepbound   Arli Bree, Pharm.D [place] [facility] [address], [place], [place] [address] Phone: [phone]; Fax: [phone]

## 2024-04-21 DIAGNOSIS — D649 Anemia, unspecified: Secondary | ICD-10-CM | POA: Diagnosis not present

## 2024-04-21 DIAGNOSIS — I5022 Chronic systolic (congestive) heart failure: Secondary | ICD-10-CM | POA: Diagnosis not present

## 2024-04-21 DIAGNOSIS — F411 Generalized anxiety disorder: Secondary | ICD-10-CM | POA: Diagnosis not present

## 2024-04-21 DIAGNOSIS — I2510 Atherosclerotic heart disease of native coronary artery without angina pectoris: Secondary | ICD-10-CM | POA: Diagnosis not present

## 2024-04-21 DIAGNOSIS — I110 Hypertensive heart disease with heart failure: Secondary | ICD-10-CM | POA: Diagnosis not present

## 2024-04-21 DIAGNOSIS — I480 Paroxysmal atrial fibrillation: Secondary | ICD-10-CM | POA: Diagnosis not present

## 2024-04-21 DIAGNOSIS — G629 Polyneuropathy, unspecified: Secondary | ICD-10-CM | POA: Diagnosis not present

## 2024-04-24 DIAGNOSIS — I110 Hypertensive heart disease with heart failure: Secondary | ICD-10-CM | POA: Diagnosis not present

## 2024-04-24 DIAGNOSIS — G629 Polyneuropathy, unspecified: Secondary | ICD-10-CM | POA: Diagnosis not present

## 2024-04-24 DIAGNOSIS — I480 Paroxysmal atrial fibrillation: Secondary | ICD-10-CM | POA: Diagnosis not present

## 2024-04-24 DIAGNOSIS — M48061 Spinal stenosis, lumbar region without neurogenic claudication: Secondary | ICD-10-CM | POA: Diagnosis not present

## 2024-04-24 DIAGNOSIS — D649 Anemia, unspecified: Secondary | ICD-10-CM | POA: Diagnosis not present

## 2024-04-24 DIAGNOSIS — I5022 Chronic systolic (congestive) heart failure: Secondary | ICD-10-CM | POA: Diagnosis not present

## 2024-04-24 DIAGNOSIS — I2510 Atherosclerotic heart disease of native coronary artery without angina pectoris: Secondary | ICD-10-CM | POA: Diagnosis not present

## 2024-04-24 DIAGNOSIS — F411 Generalized anxiety disorder: Secondary | ICD-10-CM | POA: Diagnosis not present

## 2024-04-29 DIAGNOSIS — I110 Hypertensive heart disease with heart failure: Secondary | ICD-10-CM | POA: Diagnosis not present

## 2024-04-29 DIAGNOSIS — I480 Paroxysmal atrial fibrillation: Secondary | ICD-10-CM | POA: Diagnosis not present

## 2024-04-29 DIAGNOSIS — G629 Polyneuropathy, unspecified: Secondary | ICD-10-CM | POA: Diagnosis not present

## 2024-04-29 DIAGNOSIS — F411 Generalized anxiety disorder: Secondary | ICD-10-CM | POA: Diagnosis not present

## 2024-04-29 DIAGNOSIS — M48061 Spinal stenosis, lumbar region without neurogenic claudication: Secondary | ICD-10-CM | POA: Diagnosis not present

## 2024-04-29 DIAGNOSIS — I5022 Chronic systolic (congestive) heart failure: Secondary | ICD-10-CM | POA: Diagnosis not present

## 2024-04-29 DIAGNOSIS — D649 Anemia, unspecified: Secondary | ICD-10-CM | POA: Diagnosis not present

## 2024-04-29 DIAGNOSIS — I2510 Atherosclerotic heart disease of native coronary artery without angina pectoris: Secondary | ICD-10-CM | POA: Diagnosis not present

## 2024-05-14 ENCOUNTER — Other Ambulatory Visit: Payer: Self-pay | Admitting: Internal Medicine

## 2024-05-14 ENCOUNTER — Encounter: Payer: Self-pay | Admitting: Internal Medicine

## 2024-05-14 DIAGNOSIS — G479 Sleep disorder, unspecified: Secondary | ICD-10-CM

## 2024-05-17 MED ORDER — ALPRAZOLAM 0.25 MG PO TABS: 0.2500 mg | ORAL_TABLET | Freq: Every evening | ORAL | 0 refills | Status: DC | PRN

## 2024-05-18 DIAGNOSIS — I480 Paroxysmal atrial fibrillation: Secondary | ICD-10-CM | POA: Diagnosis not present

## 2024-05-25 ENCOUNTER — Other Ambulatory Visit: Payer: Self-pay | Admitting: Internal Medicine

## 2024-05-25 ENCOUNTER — Encounter: Payer: Self-pay | Admitting: Internal Medicine

## 2024-05-25 DIAGNOSIS — G479 Sleep disorder, unspecified: Secondary | ICD-10-CM

## 2024-05-25 MED ORDER — ZOLPIDEM TARTRATE 5 MG PO TABS: 5.0000 mg | ORAL_TABLET | Freq: Every evening | ORAL | 1 refills | Status: DC | PRN

## 2024-05-25 NOTE — Addendum Note (Signed)
 Addended by: KATHRYNE MILLMAN B on: 05/25/2024 04:31 PM   Modules accepted: Orders

## 2024-05-26 ENCOUNTER — Telehealth: Admitting: Physician Assistant

## 2024-05-26 DIAGNOSIS — R3989 Other symptoms and signs involving the genitourinary system: Secondary | ICD-10-CM | POA: Diagnosis not present

## 2024-05-26 MED ORDER — CEPHALEXIN 500 MG PO CAPS: 500.0000 mg | ORAL_CAPSULE | Freq: Two times a day (BID) | ORAL | 0 refills | Status: DC

## 2024-05-26 NOTE — Progress Notes (Signed)

## 2024-05-27 ENCOUNTER — Emergency Department (HOSPITAL_COMMUNITY)

## 2024-05-27 ENCOUNTER — Other Ambulatory Visit: Payer: Self-pay

## 2024-05-27 ENCOUNTER — Emergency Department (HOSPITAL_COMMUNITY)
Admission: EM | Admit: 2024-05-27 | Discharge: 2024-05-27 | Disposition: A | Discharge status: Home / Self Care | Attending: Emergency Medicine | Admitting: Emergency Medicine

## 2024-05-27 DIAGNOSIS — W01190A Fall on same level from slipping, tripping and stumbling with subsequent striking against furniture, initial encounter: Secondary | ICD-10-CM | POA: Insufficient documentation

## 2024-05-27 DIAGNOSIS — M25511 Pain in right shoulder: Secondary | ICD-10-CM | POA: Diagnosis not present

## 2024-05-27 DIAGNOSIS — Z043 Encounter for examination and observation following other accident: Secondary | ICD-10-CM | POA: Diagnosis not present

## 2024-05-27 DIAGNOSIS — W19XXXA Unspecified fall, initial encounter: Secondary | ICD-10-CM | POA: Diagnosis not present

## 2024-05-27 DIAGNOSIS — Z7901 Long term (current) use of anticoagulants: Secondary | ICD-10-CM | POA: Diagnosis not present

## 2024-05-27 DIAGNOSIS — R9082 White matter disease, unspecified: Secondary | ICD-10-CM | POA: Diagnosis not present

## 2024-05-27 DIAGNOSIS — I4891 Unspecified atrial fibrillation: Secondary | ICD-10-CM | POA: Diagnosis not present

## 2024-05-27 DIAGNOSIS — S2241XA Multiple fractures of ribs, right side, initial encounter for closed fracture: Secondary | ICD-10-CM | POA: Diagnosis not present

## 2024-05-27 DIAGNOSIS — S0083XA Contusion of other part of head, initial encounter: Secondary | ICD-10-CM | POA: Insufficient documentation

## 2024-05-27 DIAGNOSIS — S0990XA Unspecified injury of head, initial encounter: Secondary | ICD-10-CM | POA: Diagnosis not present

## 2024-05-27 DIAGNOSIS — S0993XA Unspecified injury of face, initial encounter: Secondary | ICD-10-CM | POA: Diagnosis present

## 2024-05-27 DIAGNOSIS — M19011 Primary osteoarthritis, right shoulder: Secondary | ICD-10-CM | POA: Diagnosis not present

## 2024-05-27 DIAGNOSIS — I700 Atherosclerosis of aorta: Secondary | ICD-10-CM | POA: Diagnosis not present

## 2024-05-27 DIAGNOSIS — M25519 Pain in unspecified shoulder: Secondary | ICD-10-CM | POA: Diagnosis not present

## 2024-05-27 LAB — CBC WITH DIFFERENTIAL/PLATELET
Abs Immature Granulocytes: 0.03 K/uL (ref 0.00–0.07)
Basophils Absolute: 0 K/uL (ref 0.0–0.1)
Basophils Relative: 1 %
Eosinophils Absolute: 0.1 K/uL (ref 0.0–0.5)
Eosinophils Relative: 1 %
HCT: 39.4 % (ref 36.0–46.0)
Hemoglobin: 12.1 g/dL (ref 12.0–15.0)
Immature Granulocytes: 1 %
Lymphocytes Relative: 7 %
Lymphs Abs: 0.5 K/uL — ABNORMAL LOW (ref 0.7–4.0)
MCH: 31.6 pg (ref 26.0–34.0)
MCHC: 30.7 g/dL (ref 30.0–36.0)
MCV: 102.9 fL — ABNORMAL HIGH (ref 80.0–100.0)
Monocytes Absolute: 0.4 K/uL (ref 0.1–1.0)
Monocytes Relative: 6 %
Neutro Abs: 5.5 K/uL (ref 1.7–7.7)
Neutrophils Relative %: 84 %
Platelets: 228 K/uL (ref 150–400)
RBC: 3.83 MIL/uL — ABNORMAL LOW (ref 3.87–5.11)
RDW: 13 % (ref 11.5–15.5)
WBC: 6.5 K/uL (ref 4.0–10.5)
nRBC: 0 % (ref 0.0–0.2)

## 2024-05-27 LAB — URINALYSIS, W/ REFLEX TO CULTURE (INFECTION SUSPECTED)
Bilirubin Urine: NEGATIVE
Glucose, UA: NEGATIVE mg/dL
Hgb urine dipstick: NEGATIVE
Ketones, ur: NEGATIVE mg/dL
Nitrite: NEGATIVE
Protein, ur: NEGATIVE mg/dL
Specific Gravity, Urine: 1.011 (ref 1.005–1.030)
pH: 5 (ref 5.0–8.0)

## 2024-05-27 LAB — BASIC METABOLIC PANEL WITH GFR
Anion gap: 10 (ref 5–15)
BUN: 13 mg/dL (ref 8–23)
CO2: 21 mmol/L — ABNORMAL LOW (ref 22–32)
Calcium: 9 mg/dL (ref 8.9–10.3)
Chloride: 103 mmol/L (ref 98–111)
Creatinine, Ser: 0.92 mg/dL (ref 0.44–1.00)
GFR, Estimated: >60 mL/min (ref >60)
Glucose, Bld: 109 mg/dL — ABNORMAL HIGH (ref 70–99)
Potassium: 4.4 mmol/L (ref 3.5–5.1)
Sodium: 134 mmol/L — ABNORMAL LOW (ref 135–145)

## 2024-05-27 NOTE — ED Provider Notes (Signed)
 [place] EMERGENCY DEPARTMENT AT [facility] Provider Note   CSN: [id number] Arrival date & time: 05/27/24  9491     Patient presents with: Leslie [name]   SYNAI PRETTYMAN is a 73 y.o. female.   Patient presents to the emergency department for evaluation after a fall.  Patient reports that she sat up on the edge of her bed and attempted to transfer to her potty chair, but her legs gave out and she fell.  She did hit her face on the end table, no loss of consciousness.  Currently complaining only of right shoulder pain.  She does take blood thinners.  Patient reports that she does not walk, uses a wheelchair.  She has recently been treated for UTI.       Prior to Admission medications   Medication Sig Start Date End Date Taking? Authorizing Provider  ALPRAZolam  (XANAX ) 0.25 MG tablet Take 1 tablet (0.25 mg total) by mouth at bedtime as needed for anxiety. 05/17/24   Theophilus Andrews, Tully GRADE, MD  amphetamine -dextroamphetamine  (ADDERALL  XR) 30 MG 24 hr capsule Take 1 capsule (30 mg total) by mouth daily. 03/18/24   Theophilus Andrews, Tully GRADE, MD  amphetamine -dextroamphetamine  (ADDERALL  XR) 30 MG 24 hr capsule Take 1 capsule (30 mg total) by mouth daily. 03/18/24   Theophilus Andrews, Tully GRADE, MD  amphetamine -dextroamphetamine  (ADDERALL  XR) 30 MG 24 hr capsule Take 1 capsule (30 mg total) by mouth daily. 03/18/24   Theophilus Andrews, Tully GRADE, MD  amphetamine -dextroamphetamine  (ADDERALL ) 20 MG tablet Take 1 tablet (20 mg total) by mouth daily. 03/18/24   Theophilus Andrews, Tully GRADE, MD  amphetamine -dextroamphetamine  (ADDERALL ) 20 MG tablet Take 1 tablet (20 mg total) by mouth daily. 03/18/24   Theophilus Andrews, Tully GRADE, MD  amphetamine -dextroamphetamine  (ADDERALL ) 20 MG tablet Take 1 tablet (20 mg total) by mouth daily. 03/18/24   Theophilus Andrews, Tully GRADE, MD  apixaban  (ELIQUIS ) 5 MG TABS tablet TAKE 1 TABLET(5 MG) BY MOUTH TWICE DAILY 10/13/23   Okey Vina GAILS, MD  atorvastatin  (LIPITOR)  40 MG tablet Take 1 tablet (40 mg total) by mouth daily. Take 40 mg by mouth daily.Please schedule an appointment for further refills 11/28/23   Sabharwal, Aditya, DO  cephALEXin (KEFLEX) 500 MG capsule Take 1 capsule (500 mg total) by mouth 2 (two) times daily. 05/26/24   Burnette, Jennifer M, PA-C  Cholecalciferol (VITAMIN D3) 50 MCG (2000 UT) TABS Take 2,000 Units by mouth daily.    [provider]  FLUoxetine  (PROZAC ) 40 MG capsule TAKE 1 CAPSULE(40 MG) BY MOUTH EVERY MORNING 03/09/24   Theophilus Andrews, Tully GRADE, MD  furosemide  (LASIX ) 40 MG tablet Take 0.5 tablets (20 mg total) by mouth daily as needed for edema or fluid. 01/29/24   Clegg, Amy D, NP  gabapentin  (NEURONTIN ) 300 MG capsule TAKE 1 CAPSULE BY MOUTH THREE TIMES DAILY 04/28/23   Theophilus Andrews, Tully GRADE, MD  lamoTRIgine  (LAMICTAL ) 100 MG tablet TAKE 1 TABLET(100 MG) BY MOUTH TWICE DAILY 03/09/24   Theophilus Andrews, Tully GRADE, MD  losartan  (COZAAR ) 25 MG tablet TAKE 1/2 TABLET(12.5 MG) BY MOUTH DAILY Patient taking differently: Take 25 mg by mouth daily. 09/10/23   Rolan Ezra RAMAN, MD  melatonin 3 MG TABS tablet Take 1 tablet (3 mg total) by mouth at bedtime. 01/09/23   [place], Camellia PARAS, DO  metoprolol  succinate (TOPROL -XL) 50 MG 24 hr tablet TAKE 1 TABLET(50 MG) BY MOUTH AT BEDTIME WITH OR IMMEDIATELY FOLLOWING A MEAL 03/09/24   Sabharwal, [place],  DO  Multiple Vitamins-Minerals (PRESERVISION AREDS 2) CAPS Take 1 capsule by mouth 2 (two) times daily.    [provider]  Semaglutide -Weight Management (WEGOVY ) 0.25 MG/0.5ML SOAJ Inject 0.25 mg into the skin once a week. Patient not taking: Reported on 01/29/2024 11/27/23   Theophilus Andrews, Tully GRADE, MD  spironolactone  (ALDACTONE ) 25 MG tablet Take 0.5 tablets (12.5 mg total) by mouth daily. 02/26/24   Marcine Catalan M, PA-C  zolpidem  (AMBIEN ) 5 MG tablet Take 1 tablet (5 mg total) by mouth at bedtime as needed for sleep. 05/25/24   Theophilus Andrews, Tully GRADE, MD    Allergies:  Amoxicillin, Misc. sulfonamide containing compounds, Nsaids, and Sulfamethoxazole    Review of Systems  Updated Vital Signs BP 131/60 (BP Location: Left Arm)   Pulse 61   Temp (!) 97.1 F (36.2 C) (Temporal)   Resp 13   SpO2 100%   Physical Exam Vitals and nursing note reviewed.  Constitutional:      General: She is not in acute distress.    Appearance: She is well-developed.  HENT:     Head: Normocephalic and atraumatic.     Mouth/Throat:     Mouth: Mucous membranes are moist.  Eyes:     General: Vision grossly intact. Gaze aligned appropriately.     Extraocular Movements: Extraocular movements intact.     Conjunctiva/sclera: Conjunctivae normal.  Cardiovascular:     Rate and Rhythm: Normal rate and regular rhythm.     Pulses: Normal pulses.     Heart sounds: Normal heart sounds, S1 normal and S2 normal. No murmur heard.    No friction rub. No gallop.  Pulmonary:     Effort: Pulmonary effort is normal. No respiratory distress.     Breath sounds: Normal breath sounds.  Abdominal:     General: Bowel sounds are normal.     Palpations: Abdomen is soft.     Tenderness: There is no abdominal tenderness. There is no guarding or rebound.     Hernia: No hernia is present.  Musculoskeletal:        General: No swelling.     Cervical back: Full passive range of motion without pain, normal range of motion and neck supple. No spinous process tenderness or muscular tenderness. Normal range of motion.     Right lower leg: No edema.     Left lower leg: No edema.  Skin:    General: Skin is warm and dry.     Capillary Refill: Capillary refill takes less than 2 seconds.     Findings: No ecchymosis, erythema, rash or wound.  Neurological:     General: No focal deficit present.     Mental Status: She is alert and oriented to person, place, and time.     GCS: GCS eye subscore is 4. GCS verbal subscore is 5. GCS motor subscore is 6.     Cranial Nerves: Cranial nerves 2-12 are intact.      Sensory: Sensation is intact.     Motor: Motor function is intact.     Coordination: Coordination is intact.  Psychiatric:        Attention and Perception: Attention normal.        Mood and Affect: Mood normal.        Speech: Speech normal.        Behavior: Behavior normal.     (all labs ordered are listed, but only abnormal results are displayed) Labs Reviewed  CBC WITH DIFFERENTIAL/PLATELET - Abnormal; Notable for  the following components:      Result Value   RBC 3.83 (*)    MCV 102.9 (*)    Lymphs Abs 0.5 (*)    All other components within normal limits  BASIC METABOLIC PANEL WITH GFR - Abnormal; Notable for the following components:   Sodium 134 (*)    CO2 21 (*)    Glucose, Bld 109 (*)    All other components within normal limits  URINALYSIS, W/ REFLEX TO CULTURE (INFECTION SUSPECTED) - Abnormal; Notable for the following components:   APPearance HAZY (*)    Leukocytes,Ua TRACE (*)    Bacteria, UA RARE (*)    All other components within normal limits    EKG: None  Radiology: CT HEAD WO CONTRAST ( ) Result Date: 05/27/2024 EXAM: CT HEAD WITHOUT CONTRAST 05/27/2024 05:51:08 AM TECHNIQUE: CT of the head was performed without the administration of intravenous contrast. Automated exposure control, iterative reconstruction, and/or weight based adjustment of the mA/kV was utilized to reduce the radiation dose to as low as reasonably achievable. COMPARISON: Head CT 06/12/2023. CLINICAL HISTORY: 73 year old female. Head trauma, minor (Age >= 65y). FINDINGS: BRAIN AND VENTRICLES: No acute hemorrhage. No evidence of acute infarct. No hydrocephalus. No extra-axial collection. No mass effect or midline shift. Stable brain volume from last year. Chronic cerebral white matter disease, confluent left greater than right, moderate per age. Stable gray white differentiation. Calcified atherosclerosis at the skull base. No suspicious intracranial vascular hyperdensity. ORBITS: Superficial soft  tissue swelling and stranding lateral to the right orbit on series 3 image 28. Postoperative changes to both globes. Intraorbital soft tissues appear stable. SINUSES: Chronic left maxillary sinus disease with mucoperiosteal thickening not significantly changed. Other paranasal sinuses, middle ears and mastoids remain better aerated. SOFT TISSUES AND SKULL: Superficial soft tissue swelling and stranding lateral to the right orbit on series 3 image 28. No scalp soft tissue gas. Osteopenia. No skull fracture. IMPRESSION: 1. Right periorbital soft tissue injury. 2. No acute intracranial abnormality. Moderately advanced chronic cerebral white matter disease. Electronically signed by: Helayne Hurst MD 05/27/2024 05:57 AM EST RP Workstation: [id number]   DG Shoulder Right Result Date: 05/27/2024 EXAM: 1 VIEW XRAY OF THE RIGHT SHOULDER 05/27/2024 05:34:22 AM COMPARISON: None available. CLINICAL HISTORY: fall FINDINGS: BONES AND JOINTS: Glenohumeral joint is normally aligned. No acute fracture or dislocation. Mild glenohumeral joint degenerative change. Moderate degenerative changes are noted involving the acromioclavicular joint. Marked narrowing of the acromiohumeral interval is identified. SOFT TISSUES: No abnormal calcifications. Visualized lung is unremarkable. IMPRESSION: 1. No acute fracture or dislocation. 2. Marked narrowing of the acromiohumeral interval, which is concerning for rotator cuff injury. Electronically signed by: Waddell Calk MD 05/27/2024 05:40 AM EST RP Workstation: [id number]   DG Chest Port 1 View Result Date: 05/27/2024 EXAM: 1 VIEW(S) XRAY OF THE CHEST 05/27/2024 05:34:22 AM COMPARISON: 06/12/2023 CLINICAL HISTORY: fall FINDINGS: LUNGS AND PLEURA: No focal pulmonary opacity. No pulmonary edema. No pleural effusion. No pneumothorax. HEART AND MEDIASTINUM: Aortic atherosclerotic calcifications. BONES AND SOFT TISSUES: Thoracolumbar scoliosis deformity with thoracic convexity towards the right.  Multiple remote healed right rib fractures as noted previously. IMPRESSION: 1. No acute cardiopulmonary process. Electronically signed by: Waddell Calk MD 05/27/2024 05:38 AM EST RP Workstation: [id number]     Procedures   Medications Ordered in the ED - No data to display  Clinical Course as of 05/27/24 0746  Thu May 27, 2024  0727 Received signout; pending UA and discharge. +/- antibiotics.  [TY]    Clinical  Course User Index [TY] Neysa Caron PARAS, DO                                 Medical Decision Making Amount and/or Complexity of Data Reviewed Labs: ordered. Decision-making details documented in ED Course. Radiology: ordered and independent interpretation performed. Decision-making details documented in ED Course.   Presents to the emergency department for evaluation of fall.  Patient fell from her bed and did hit her head.  She is on Eliquis .  Patient appears well at arrival, no focal neurologic symptoms.  She did not lose consciousness.  Patient complaining of right shoulder pain.  X-ray of chest and right shoulder unremarkable.  No lower extremity symptoms.  Basic labs unremarkable.  CT head, cervical spine without acute injury.  Urinalysis unremarkable, culture pending.      Final diagnoses:  Contusion of face, initial encounter    ED Discharge Orders     None          Amaia Lavallie, Lonni PARAS, MD 05/27/24 [phone]

## 2024-05-27 NOTE — ED Triage Notes (Signed)
 Pt bib [facility] after she got up to go to the bathroom. Pt has had weakness recently due to a UTI but mainly uses a wheelchair at home to get around. Pt did hit her forehead on the nightstand and landed on her right shoulder. Pt was able to tell alexa to call her daughter for help so she was not laying on the floor for long. AOx4 resp EU. Take Eliquis  for afib

## 2024-05-27 NOTE — ED Notes (Signed)
 Back from CT. Daughter at bedside

## 2024-05-27 NOTE — Progress Notes (Addendum)
 Orthopedic Tech Progress Note Patient Details:  Leslie [name] 05-05-1951 [id number] LV2T FOT. R shoulder pain. Nothing obviously deformed. No orders at this time. Patient ID: Leslie [name], female   DOB: [birth year], 72 y.o.   MRN: [id number]  Leslie [name] 05/27/2024, 6:47 AM

## 2024-05-28 DIAGNOSIS — H353131 Nonexudative age-related macular degeneration, bilateral, early dry stage: Secondary | ICD-10-CM | POA: Diagnosis not present

## 2024-05-31 ENCOUNTER — Telehealth: Payer: Self-pay | Admitting: *Deleted

## 2024-05-31 NOTE — Telephone Encounter (Signed)
 Copied from CRM [id number]. Topic: Clinical - Medication Prior Auth >> May 31, 2024  3:35 PM Alfonso ORN wrote: Reason for CRM: Insurance denied Wegovy  pt called with [facility] needing prior authorization  [phone] . Please contact pt with update.

## 2024-06-01 ENCOUNTER — Telehealth: Payer: Self-pay | Admitting: *Deleted

## 2024-06-01 DIAGNOSIS — I5022 Chronic systolic (congestive) heart failure: Secondary | ICD-10-CM

## 2024-06-01 DIAGNOSIS — M48061 Spinal stenosis, lumbar region without neurogenic claudication: Secondary | ICD-10-CM

## 2024-06-01 DIAGNOSIS — R269 Unspecified abnormalities of gait and mobility: Secondary | ICD-10-CM

## 2024-06-01 NOTE — Telephone Encounter (Signed)
 Referral placed.

## 2024-06-01 NOTE — Telephone Encounter (Signed)
 Copied from CRM [id number]. Topic: General - Other >> May 31, 2024  4:21 PM Rosina BIRCH wrote: Reason for CRM: eleanor from [employer] called with the patient on the line stating the patient wants a pre-authorization for home health care orders.  The patient need a personal care nurse to help her around the house and it has to be medically necessary. They also need the hours and days the patient need it for [phone] Authorization department for providers-[phone]

## 2024-06-02 ENCOUNTER — Other Ambulatory Visit (HOSPITAL_COMMUNITY): Payer: Self-pay

## 2024-06-02 ENCOUNTER — Encounter (HOSPITAL_COMMUNITY): Payer: Self-pay | Admitting: Emergency Medicine

## 2024-06-02 ENCOUNTER — Other Ambulatory Visit: Payer: Self-pay

## 2024-06-02 ENCOUNTER — Ambulatory Visit: Admitting: Internal Medicine

## 2024-06-02 ENCOUNTER — Emergency Department (HOSPITAL_COMMUNITY)

## 2024-06-02 ENCOUNTER — Telehealth: Payer: Self-pay

## 2024-06-02 ENCOUNTER — Emergency Department (HOSPITAL_COMMUNITY): Admission: EM | Admit: 2024-06-02 | Discharge: 2024-06-02 | Disposition: A | Discharge status: Home / Self Care

## 2024-06-02 DIAGNOSIS — W19XXXA Unspecified fall, initial encounter: Secondary | ICD-10-CM | POA: Diagnosis not present

## 2024-06-02 DIAGNOSIS — I10 Essential (primary) hypertension: Secondary | ICD-10-CM | POA: Diagnosis not present

## 2024-06-02 DIAGNOSIS — Z7901 Long term (current) use of anticoagulants: Secondary | ICD-10-CM | POA: Diagnosis not present

## 2024-06-02 DIAGNOSIS — M8580 Other specified disorders of bone density and structure, unspecified site: Secondary | ICD-10-CM | POA: Diagnosis not present

## 2024-06-02 DIAGNOSIS — Z043 Encounter for examination and observation following other accident: Secondary | ICD-10-CM | POA: Insufficient documentation

## 2024-06-02 DIAGNOSIS — M4316 Spondylolisthesis, lumbar region: Secondary | ICD-10-CM | POA: Diagnosis not present

## 2024-06-02 DIAGNOSIS — S300XXA Contusion of lower back and pelvis, initial encounter: Secondary | ICD-10-CM | POA: Diagnosis not present

## 2024-06-02 DIAGNOSIS — S0990XA Unspecified injury of head, initial encounter: Secondary | ICD-10-CM | POA: Diagnosis not present

## 2024-06-02 DIAGNOSIS — I2510 Atherosclerotic heart disease of native coronary artery without angina pectoris: Secondary | ICD-10-CM | POA: Insufficient documentation

## 2024-06-02 DIAGNOSIS — E8809 Other disorders of plasma-protein metabolism, not elsewhere classified: Secondary | ICD-10-CM

## 2024-06-02 DIAGNOSIS — I700 Atherosclerosis of aorta: Secondary | ICD-10-CM | POA: Diagnosis not present

## 2024-06-02 LAB — COMPREHENSIVE METABOLIC PANEL WITH GFR
ALT: 20 U/L (ref 0–44)
AST: 27 U/L (ref 15–41)
Albumin: 3.3 g/dL — ABNORMAL LOW (ref 3.5–5.0)
Alkaline Phosphatase: 155 U/L — ABNORMAL HIGH (ref 38–126)
Anion gap: 12 (ref 5–15)
BUN: 12 mg/dL (ref 8–23)
CO2: 22 mmol/L (ref 22–32)
Calcium: 9 mg/dL (ref 8.9–10.3)
Chloride: 102 mmol/L (ref 98–111)
Creatinine, Ser: 0.91 mg/dL (ref 0.44–1.00)
GFR, Estimated: >60 mL/min (ref >60)
Glucose, Bld: 90 mg/dL (ref 70–99)
Potassium: 4.7 mmol/L (ref 3.5–5.1)
Sodium: 136 mmol/L (ref 135–145)
Total Bilirubin: 1.3 mg/dL — ABNORMAL HIGH (ref 0.0–1.2)
Total Protein: 6 g/dL — ABNORMAL LOW (ref 6.5–8.1)

## 2024-06-02 LAB — URINALYSIS, ROUTINE W REFLEX MICROSCOPIC
Bilirubin Urine: NEGATIVE
Glucose, UA: NEGATIVE mg/dL
Hgb urine dipstick: NEGATIVE
Ketones, ur: 5 mg/dL — AB
Leukocytes,Ua: NEGATIVE
Nitrite: NEGATIVE
Protein, ur: NEGATIVE mg/dL
Specific Gravity, Urine: 1.015 (ref 1.005–1.030)
pH: 5 (ref 5.0–8.0)

## 2024-06-02 LAB — CBC WITH DIFFERENTIAL/PLATELET
Abs Immature Granulocytes: 0.05 K/uL (ref 0.00–0.07)
Basophils Absolute: 0 K/uL (ref 0.0–0.1)
Basophils Relative: 0 %
Eosinophils Absolute: 0.1 K/uL (ref 0.0–0.5)
Eosinophils Relative: 1 %
HCT: 37.1 % (ref 36.0–46.0)
Hemoglobin: 11.5 g/dL — ABNORMAL LOW (ref 12.0–15.0)
Immature Granulocytes: 1 %
Lymphocytes Relative: 8 %
Lymphs Abs: 0.6 K/uL — ABNORMAL LOW (ref 0.7–4.0)
MCH: 31.3 pg (ref 26.0–34.0)
MCHC: 31 g/dL (ref 30.0–36.0)
MCV: 101.1 fL — ABNORMAL HIGH (ref 80.0–100.0)
Monocytes Absolute: 0.6 K/uL (ref 0.1–1.0)
Monocytes Relative: 8 %
Neutro Abs: 5.9 K/uL (ref 1.7–7.7)
Neutrophils Relative %: 82 %
Platelets: 228 K/uL (ref 150–400)
RBC: 3.67 MIL/uL — ABNORMAL LOW (ref 3.87–5.11)
RDW: 13.1 % (ref 11.5–15.5)
WBC: 7.2 K/uL (ref 4.0–10.5)
nRBC: 0 % (ref 0.0–0.2)

## 2024-06-02 NOTE — Discharge Instructions (Addendum)
 Recommend increasing protein intake, specifically can take Boost drinks or other protein supplementation to help increase her levels of protein as they were decreased today.  Often times this can be responsible for generalized weakness and falls and may be causing her symptoms that she is experiencing with her recent falls.  Would ultimately have you follow-up with your primary care regarding this.

## 2024-06-02 NOTE — Telephone Encounter (Signed)
 Pharmacy Patient Advocate Encounter   Received notification from Pt Calls Messages that prior authorization for Wegovy  0.25 is required/requested.   Insurance verification completed.   The patient is insured through [employer].   Per test claim: PA required; PA submitted to above mentioned insurance via Latent Key/confirmation #/EOC [facility] Status is pending

## 2024-06-02 NOTE — Telephone Encounter (Signed)
 Pharmacy Patient Advocate Encounter  Received notification from [employer] that Prior Authorization for Wegovy  0.25 has been DENIED.  Full denial letter will be uploaded to the media tab. See denial reason below.    PA #/Case ID/Reference #: # [id number]

## 2024-06-02 NOTE — ED Triage Notes (Signed)
 Pt arrives via EMS from home with reports of multiple falls but increasing frequency. Last fall this morning around 7 when getting out of bed and pt laid herself down. Older bruise on face from last week. Denies hitting head today.

## 2024-06-02 NOTE — ED Provider Notes (Signed)
  Physical Exam  BP (!) 132/94 (BP Location: Right Arm)   Pulse (!) 59   Temp 97.9 F (36.6 C) (Oral)   Resp 18   Ht 5' 4 (1.626 m)   Wt 89.8 kg   SpO2 100%   BMI 33.98 kg/m   Physical Exam  Procedures  Procedures  ED Course / MDM    Medical Decision Making Amount and/or Complexity of Data Reviewed Labs: ordered. Radiology: ordered.   Leslie [name] is a 73 y.o. female who presented to the ED today secondary to fall while transitioning from her wheelchair to her bed, fell straight down on 2 her buttocks, no head impact or loss of consciousness.  Patient does take anticoagulants, specifically Eliquis .  Previous physical exam did not show bruising and abrasions from previous fall but however did not show any new traumatic findings.  Workup in process at time of care assumption, currently awaiting results of CMP, CBC, and UA.  Also plain film imaging was obtained of the sacrum and coccyx to evaluate for acute fracture.  At this time plan is to ambulate the patient if all imaging and labs are normal, if able to ambulate with normal workup will discharge with outpatient follow-up.  Lab evaluation is unremarkable, other than decreased protein and albumin possibly showing signs of protein calorie malnutrition leading to multiple falls.  Discussed this with the patient and suggested supplementation.  Also recommend primary care follow-up regarding her recent falls.  This was discussed thoroughly with the patient and her daughter, they understand agree had no further concerns at this time.  Will discharge with outpatient follow-up as previously discussed as all findings are unremarkable, imaging findings posted.  SACRUM AND COCCYX - 2+ VIEW    COMPARISON:  CT pelvis 01/26/2023.    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 The bones are  osteopenic. No definite acute fracture or dislocation identified.  There is stable 4 mm of anterolisthesis at L4-L5. There are   atherosclerotic calcifications of the aorta. Calcifications in the  soft tissues in the bilateral gluteal regions appear unchanged.    IMPRESSION:  1. No definite acute fracture or dislocation identified.  2. Bilateral sacroplasty changes.   As there is no signs of acute injury to the sacrum or pelvis, and there are no other concerning findings on the workup or evaluation, we will discharge as previously discussed with outpatient follow-up.   Myriam Dorn BROCKS, PA 06/02/24 1737    Geraldene Hamilton, MD 06/02/24 [phone]

## 2024-06-02 NOTE — ED Provider Notes (Signed)
  EMERGENCY DEPARTMENT AT [facility] Provider Note   CSN: [id number] Arrival date & time: 06/02/24  1323     Patient presents with: Leslie [name] is a 73 y.o. female.  Fall  Patient is a 73 year old female presenting the ED today presents for a mechanical fall after trying to transition from wheelchair to bed when her foot got caught in the wheelchair causing her to fall onto her buttocks, did not hit head, did not lose consciousness.  Notes that she is currently not in pain.  However was required to sit on the floor for approximately 3 hours awaiting daughter's arrival to help her up.  Personal history of HTN, dysrhythmia, GERD, CAD, narcotic addiction, neuropathy.  Anticoagulated on Eliquis  with no missed doses.  Noted to have been previously seen for a fall on 05/27/2024 also from when falling while trying to transition her self out of wheelchair.  Still has bruising noted from previous injuries.  However notes that she does not have any pain currently and is currently asymptomatic and feels normal.  Denies fever, headache, blurry vision, vertigo, dysphagia, unilateral weakness, chest pain, shortness of breath, abdominal pain, nausea, vomiting, diarrhea, dysuria, hematuria, melena, hematochezia, lower leg swelling, rashes.     Prior to Admission medications   Medication Sig Start Date End Date Taking? Authorizing Provider  ALPRAZolam  (XANAX ) 0.25 MG tablet Take 1 tablet (0.25 mg total) by mouth at bedtime as needed for anxiety. 05/17/24   Theophilus Andrews, Tully GRADE, MD  amphetamine -dextroamphetamine  (ADDERALL  XR) 30 MG 24 hr capsule Take 1 capsule (30 mg total) by mouth daily. 03/18/24   Theophilus Andrews, Tully GRADE, MD  amphetamine -dextroamphetamine  (ADDERALL  XR) 30 MG 24 hr capsule Take 1 capsule (30 mg total) by mouth daily. 03/18/24   Theophilus Andrews, Tully GRADE, MD  amphetamine -dextroamphetamine  (ADDERALL  XR) 30 MG 24 hr capsule Take 1 capsule (30 mg  total) by mouth daily. 03/18/24   Theophilus Andrews, Tully GRADE, MD  amphetamine -dextroamphetamine  (ADDERALL ) 20 MG tablet Take 1 tablet (20 mg total) by mouth daily. 03/18/24   Theophilus Andrews, Tully GRADE, MD  amphetamine -dextroamphetamine  (ADDERALL ) 20 MG tablet Take 1 tablet (20 mg total) by mouth daily. 03/18/24   Theophilus Andrews, Tully GRADE, MD  amphetamine -dextroamphetamine  (ADDERALL ) 20 MG tablet Take 1 tablet (20 mg total) by mouth daily. 03/18/24   Theophilus Andrews, Tully GRADE, MD  apixaban  (ELIQUIS ) 5 MG TABS tablet TAKE 1 TABLET(5 MG) BY MOUTH TWICE DAILY 10/13/23   Okey Vina GAILS, MD  atorvastatin  (LIPITOR) 40 MG tablet Take 1 tablet (40 mg total) by mouth daily. Take 40 mg by mouth daily.Please schedule an appointment for further refills 11/28/23   Sabharwal, Aditya, DO  cephALEXin (KEFLEX) 500 MG capsule Take 1 capsule (500 mg total) by mouth 2 (two) times daily. 05/26/24   Burnette, Jennifer M, PA-C  Cholecalciferol (VITAMIN D3) 50 MCG (2000 UT) TABS Take 2,000 Units by mouth daily.    [provider]  FLUoxetine  (PROZAC ) 40 MG capsule TAKE 1 CAPSULE(40 MG) BY MOUTH EVERY MORNING 03/09/24   Theophilus Andrews, Tully GRADE, MD  furosemide  (LASIX ) 40 MG tablet Take 0.5 tablets (20 mg total) by mouth daily as needed for edema or fluid. 01/29/24   Clegg, Amy D, NP  gabapentin  (NEURONTIN ) 300 MG capsule TAKE 1 CAPSULE BY MOUTH THREE TIMES DAILY 04/28/23   Theophilus Andrews, Tully GRADE, MD  lamoTRIgine  (LAMICTAL ) 100 MG tablet TAKE 1 TABLET(100 MG) BY MOUTH TWICE DAILY 03/09/24   Theophilus  Delma Tully GRADE, MD  losartan  (COZAAR ) 25 MG tablet TAKE 1/2 TABLET(12.5 MG) BY MOUTH DAILY Patient taking differently: Take 25 mg by mouth daily. 09/10/23   Rolan Ezra RAMAN, MD  melatonin 3 MG TABS tablet Take 1 tablet (3 mg total) by mouth at bedtime. 01/09/23   [place], Camellia PARAS, DO  metoprolol  succinate (TOPROL -XL) 50 MG 24 hr tablet TAKE 1 TABLET(50 MG) BY MOUTH AT BEDTIME WITH OR IMMEDIATELY FOLLOWING A MEAL 03/09/24    Sabharwal, Aditya, DO  Multiple Vitamins-Minerals (PRESERVISION AREDS 2) CAPS Take 1 capsule by mouth 2 (two) times daily.    [provider]  Semaglutide -Weight Management (WEGOVY ) 0.25 MG/0.5ML SOAJ Inject 0.25 mg into the skin once a week. Patient not taking: Reported on 01/29/2024 11/27/23   Theophilus Delma, Tully GRADE, MD  spironolactone  (ALDACTONE ) 25 MG tablet Take 0.5 tablets (12.5 mg total) by mouth daily. 02/26/24   Marcine Catalan M, PA-C  zolpidem  (AMBIEN ) 5 MG tablet Take 1 tablet (5 mg total) by mouth at bedtime as needed for sleep. 05/25/24   Theophilus Delma, Tully GRADE, MD    Allergies: Amoxicillin, Misc. sulfonamide containing compounds, Nsaids, and Sulfamethoxazole    Review of Systems  Musculoskeletal:        Fall  All other systems reviewed and are negative.   Updated Vital Signs BP (!) 132/94 (BP Location: Right Arm)   Pulse (!) 59   Temp 97.9 F (36.6 C) (Oral)   Resp 18   Ht 5' 4 (1.626 m)   Wt 89.8 kg   SpO2 100%   BMI 33.98 kg/m   Physical Exam Vitals and nursing note reviewed.  Constitutional:      General: She is not in acute distress.    Appearance: Normal appearance. She is not ill-appearing or diaphoretic.  HENT:     Head: Normocephalic and atraumatic.  Eyes:     General: No scleral icterus.       Right eye: No discharge.        Left eye: No discharge.     Extraocular Movements: Extraocular movements intact.     Conjunctiva/sclera: Conjunctivae normal.  Cardiovascular:     Rate and Rhythm: Normal rate and regular rhythm.     Pulses: Normal pulses.     Heart sounds: Normal heart sounds. No murmur heard.    No friction rub. No gallop.  Pulmonary:     Effort: Pulmonary effort is normal. No respiratory distress.     Breath sounds: No stridor. No wheezing, rhonchi or rales.  Chest:     Chest wall: No tenderness.  Abdominal:     General: Abdomen is flat. There is no distension.     Palpations: Abdomen is soft.     Tenderness: There is  no abdominal tenderness. There is no right CVA tenderness, left CVA tenderness, guarding or rebound.  Musculoskeletal:        General: No swelling, deformity or signs of injury.     Cervical back: Normal range of motion. No rigidity.     Right lower leg: No edema.     Left lower leg: No edema.  Skin:    General: Skin is warm and dry.     Findings: Bruising present. No erythema or lesion.     Comments: Noted to have bruising and previous abrasions from previous fall.  No new abrasions and currently not experiencing any tenderness over upper or lower extremities, pelvis, chest, abdomen.  Neurological:     General: No focal  deficit present.     Mental Status: She is alert and oriented to person, place, and time. Mental status is at baseline.     Sensory: No sensory deficit.     Motor: No weakness.  Psychiatric:        Mood and Affect: Mood normal.     (all labs ordered are listed, but only abnormal results are displayed) Labs Reviewed  CBC WITH DIFFERENTIAL/PLATELET  COMPREHENSIVE METABOLIC PANEL WITH GFR  URINALYSIS, ROUTINE W REFLEX MICROSCOPIC    EKG: None  Radiology: No results found.  Procedures   Medications Ordered in the ED - No data to display  Medical Decision Making This patient is a 73 year old female who presents to the ED for concern of fall from when transitioning from chair to bed, with feet and caught up in her wheelchair, mechanical fall.  Noted to have slid out of chair onto her buttocks, complaining of no pain but did sit on the floor for approximately 3 hours.  Noted that this was the second fall which he was seen in the last week.  Still having some bruising and some abrasions from the previous fall, noting that his fall was not nearly as bad as the last 1. Currently not complaining any symptoms or pain at this time.  On physical exam, patient is in no acute distress, afebrile, alert and orient x 4, speaking in full sentences, nontachypneic, nontachycardic.   Notably patient does not have any pain to upper or lower extremities, did note some well-healing bruising and abrasions from previous fall but no new abrasions or bruising.  Not complaining of any pain to pelvis or chest. Unremarkable exam.  With patient's current well-appearing presentation, we will obtain baseline labs secondary to the fact that she has fallen twice within the span of a week, wanting to rule out metabolic disturbance versus anemia or an occult cause of her falls.  Suspecting this is likely secondary to age.  Awaiting labs and imaging, anticipate likely discharge  Patient care transferred over to Dorn Babe, PA-C.  Differential diagnoses prior to evaluation: The emergent differential diagnosis includes, but is not limited to, fracture, ligamentous injury, neurovascular injury, dislocation, malalignment. This is not an exhaustive differential.   Past Medical History / Co-morbidities / Social History: HTN, dysrhythmia, GERD, CAD, narcotic addiction, neuropathy  Additional history: Chart reviewed. Pertinent results include:   Last seen in the ED on 05/27/2024 for contusion to face after fall.  Noted to be anticoagulated on Eliquis .  Lab Tests/Imaging studies: I personally interpreted labs/imaging and the pertinent results include:    CBC pending CMP pending UA pending X-ray of sacrum and coccyx pending.  Medications:  I have reviewed the patients home medicines and have made adjustments as needed.  Critical Interventions: None  Social Determinants of Health: Lives at home with daughter  Disposition: 3:24 PM Care of Leslie [name] transferred to PA Josh Gundy and Dr. Zackowski at the end of my shift as the patient will require reassessment once labs/imaging have resulted. Patient presentation, ED course, and plan of care discussed with review of all pertinent labs and imaging. Please see his/her note for further details regarding further ED course and  disposition. Plan at time of handoff is await labs and imaging, anticipate likely discharge home if lab work is normal. This may be altered or completely changed at the discretion of the oncoming team pending results of further workup.   Final diagnoses:  Fall, initial encounter    ED Discharge Orders  None          Leslie [name], [place] 06/02/24 1525    Leslie Prentice SAUNDERS, MD 06/02/24 [phone]

## 2024-06-03 ENCOUNTER — Other Ambulatory Visit (HOSPITAL_COMMUNITY): Payer: Self-pay

## 2024-06-03 ENCOUNTER — Encounter: Payer: Self-pay | Admitting: Internal Medicine

## 2024-06-07 ENCOUNTER — Encounter: Payer: Self-pay | Admitting: Neurology

## 2024-06-07 ENCOUNTER — Ambulatory Visit: Admitting: Neurology

## 2024-06-08 DIAGNOSIS — I480 Paroxysmal atrial fibrillation: Secondary | ICD-10-CM | POA: Diagnosis not present

## 2024-06-08 DIAGNOSIS — M48061 Spinal stenosis, lumbar region without neurogenic claudication: Secondary | ICD-10-CM | POA: Diagnosis not present

## 2024-06-08 DIAGNOSIS — F411 Generalized anxiety disorder: Secondary | ICD-10-CM | POA: Diagnosis not present

## 2024-06-08 DIAGNOSIS — D649 Anemia, unspecified: Secondary | ICD-10-CM | POA: Diagnosis not present

## 2024-06-08 DIAGNOSIS — I2510 Atherosclerotic heart disease of native coronary artery without angina pectoris: Secondary | ICD-10-CM | POA: Diagnosis not present

## 2024-06-08 DIAGNOSIS — G629 Polyneuropathy, unspecified: Secondary | ICD-10-CM | POA: Diagnosis not present

## 2024-06-08 DIAGNOSIS — I5022 Chronic systolic (congestive) heart failure: Secondary | ICD-10-CM | POA: Diagnosis not present

## 2024-06-10 ENCOUNTER — Telehealth: Payer: Self-pay | Admitting: *Deleted

## 2024-06-10 NOTE — Telephone Encounter (Signed)
 Copied from CRM [id number]. Topic: General - Other >> Jun 10, 2024 11:21 AM China J wrote: Reason for CRM: Doyal calling from [facility] wanting to let Dr. Theophilus know that a signing on order number [id number] is needed for physical therapy. Orders were faxed on 06/04/24.  She would also like a phone call at [phone] once orders are sent back.

## 2024-06-10 NOTE — Telephone Encounter (Signed)
 Placed in Dr Hardie Shackleton folder

## 2024-06-16 ENCOUNTER — Other Ambulatory Visit: Payer: Self-pay | Admitting: Internal Medicine

## 2024-06-21 DIAGNOSIS — G629 Polyneuropathy, unspecified: Secondary | ICD-10-CM | POA: Diagnosis not present

## 2024-06-21 DIAGNOSIS — M48061 Spinal stenosis, lumbar region without neurogenic claudication: Secondary | ICD-10-CM | POA: Diagnosis not present

## 2024-06-21 DIAGNOSIS — I2510 Atherosclerotic heart disease of native coronary artery without angina pectoris: Secondary | ICD-10-CM | POA: Diagnosis not present

## 2024-06-21 DIAGNOSIS — I5022 Chronic systolic (congestive) heart failure: Secondary | ICD-10-CM | POA: Diagnosis not present

## 2024-06-21 DIAGNOSIS — D649 Anemia, unspecified: Secondary | ICD-10-CM | POA: Diagnosis not present

## 2024-06-21 DIAGNOSIS — I110 Hypertensive heart disease with heart failure: Secondary | ICD-10-CM | POA: Diagnosis not present

## 2024-06-21 DIAGNOSIS — I480 Paroxysmal atrial fibrillation: Secondary | ICD-10-CM | POA: Diagnosis not present

## 2024-06-21 DIAGNOSIS — F411 Generalized anxiety disorder: Secondary | ICD-10-CM | POA: Diagnosis not present

## 2024-06-22 ENCOUNTER — Other Ambulatory Visit: Payer: Self-pay | Admitting: Internal Medicine

## 2024-06-22 DIAGNOSIS — G479 Sleep disorder, unspecified: Secondary | ICD-10-CM

## 2024-06-22 MED ORDER — ATORVASTATIN CALCIUM 40 MG PO TABS: 40.0000 mg | ORAL_TABLET | Freq: Every day | ORAL | 1 refills | Status: DC

## 2024-06-23 DIAGNOSIS — I5022 Chronic systolic (congestive) heart failure: Secondary | ICD-10-CM | POA: Diagnosis not present

## 2024-06-23 DIAGNOSIS — I480 Paroxysmal atrial fibrillation: Secondary | ICD-10-CM | POA: Diagnosis not present

## 2024-06-23 DIAGNOSIS — G629 Polyneuropathy, unspecified: Secondary | ICD-10-CM | POA: Diagnosis not present

## 2024-06-23 DIAGNOSIS — I2510 Atherosclerotic heart disease of native coronary artery without angina pectoris: Secondary | ICD-10-CM | POA: Diagnosis not present

## 2024-06-23 DIAGNOSIS — F411 Generalized anxiety disorder: Secondary | ICD-10-CM | POA: Diagnosis not present

## 2024-06-23 DIAGNOSIS — D649 Anemia, unspecified: Secondary | ICD-10-CM | POA: Diagnosis not present

## 2024-06-23 DIAGNOSIS — I110 Hypertensive heart disease with heart failure: Secondary | ICD-10-CM | POA: Diagnosis not present

## 2024-06-23 DIAGNOSIS — M48061 Spinal stenosis, lumbar region without neurogenic claudication: Secondary | ICD-10-CM | POA: Diagnosis not present

## 2024-07-06 ENCOUNTER — Encounter: Payer: Self-pay | Admitting: Internal Medicine

## 2024-07-06 DIAGNOSIS — F900 Attention-deficit hyperactivity disorder, predominantly inattentive type: Secondary | ICD-10-CM

## 2024-07-07 ENCOUNTER — Other Ambulatory Visit: Payer: Self-pay | Admitting: Internal Medicine

## 2024-07-07 MED ORDER — AMPHETAMINE-DEXTROAMPHET ER 30 MG PO CP24: 30.0000 mg | ORAL_CAPSULE | Freq: Every day | ORAL | 0 refills | Status: AC

## 2024-07-07 MED ORDER — AMPHETAMINE-DEXTROAMPHETAMINE 20 MG PO TABS: 20.0000 mg | ORAL_TABLET | Freq: Every day | ORAL | 0 refills | Status: DC

## 2024-07-07 NOTE — Telephone Encounter (Signed)
 Prescription refill request for Eliquis  received. Indication:afib Last office visit:9/25 Scr: 0.91  11/25 Age:73 Weight:89.8  kg  Prescription refilled

## 2024-07-28 ENCOUNTER — Other Ambulatory Visit: Payer: Self-pay | Admitting: Internal Medicine

## 2024-07-28 DIAGNOSIS — G479 Sleep disorder, unspecified: Secondary | ICD-10-CM

## 2024-08-10 ENCOUNTER — Telehealth: Admitting: Internal Medicine

## 2024-08-10 ENCOUNTER — Encounter: Payer: Self-pay | Admitting: Internal Medicine

## 2024-08-10 DIAGNOSIS — G479 Sleep disorder, unspecified: Secondary | ICD-10-CM | POA: Diagnosis not present

## 2024-08-10 DIAGNOSIS — F900 Attention-deficit hyperactivity disorder, predominantly inattentive type: Secondary | ICD-10-CM

## 2024-08-10 MED ORDER — AMPHETAMINE-DEXTROAMPHETAMINE 10 MG PO TABS: ORAL_TABLET | ORAL | 0 refills | Status: AC

## 2024-08-10 MED ORDER — AMPHETAMINE-DEXTROAMPHETAMINE 20 MG PO TABS: 20.0000 mg | ORAL_TABLET | Freq: Every day | ORAL | 0 refills | Status: AC

## 2024-08-10 MED ORDER — ZOLPIDEM TARTRATE 5 MG PO TABS: 5.0000 mg | ORAL_TABLET | Freq: Every day | ORAL | 0 refills | Status: AC

## 2024-08-10 NOTE — Progress Notes (Signed)
 Per patient no change in vitals since last visit, unable to obtain new vitals due to telehealth visit.

## 2024-08-10 NOTE — Progress Notes (Signed)
 "   Virtual Visit via Video Note  I connected with Leslie [name] on 08/10/24 at  9:30 AM EST by a video enabled telemedicine application and verified that I am speaking with the correct person using two identifiers.  Location patient: home Location provider: work office Persons participating in the virtual visit: patient, provider  I discussed the limitations of evaluation and management by telemedicine and the availability of in person appointments. The patient expressed understanding and agreed to proceed.   HPI: This visit has been scheduled for the purpose of refill of controlled substances including Adderall  and Ambien .  Due to concern for frequent falls at last visit we had titrated down the 10 mg of Ambien  down to 5 and she has been tolerating this well.  Today we talked about decreasing her Adderall  doses.  She has been taking 30 mg in the morning with an extra 20 in the evening.  She is now retired.   ROS: Negative unless indicated in HPI.  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   Colon polyps    Depression 1995   Dysrhythmia    A-fib   GERD (gastroesophageal reflux disease) 2020   Hypertension    Left knee pain    chronic   Narcotic addiction (HCC)    Neuromuscular disorder (HCC) 2013   Shingles   Neuropathy    bilateral feet   Obesity    post gastric bypass   Spinal stenosis    Vitamin D  deficiency     Past Surgical History:  Procedure Laterality Date   APPENDECTOMY  1980   arthscopic knee surgery Left    several times   ATRIAL FIBRILLATION ABLATION N/A 04/11/2023   Procedure: ATRIAL FIBRILLATION ABLATION;  Surgeon: Nancey Eulas BRAVO, MD;  Location: [facility] INVAS[facility];  Service: Cardiovascular;  Laterality: N/A;   BREAST BIOPSY     BREAST SURGERY   years ago   breast biopsy   CHOLECYSTECTOMY     EYE SURGERY Bilateral 2013   Toric Lens implants   EYE SURGERY Right 2014   corneal amniotic membrane   FORAMINOTOMY 2 LEVEL N/A 10/14/2023   Procedure: Posterior Lumbar Decompression Lumbar four-lumbar five Lumbar five -Lumbar Sacral one;  Surgeon: Debby Dorn MATSU, MD;  Location: [facility] OR;  Service: Neurosurgery;  Laterality: N/A;   GASTRIC BYPASS  2003   IR SACROPLASTY BILATERAL  01/07/2023   JOINT REPLACEMENT  2016   Left knee   KIDNEY DONATION  2004   ROTATOR CUFF REPAIR Right    TOTAL KNEE ARTHROPLASTY Left 06/16/2015   Procedure: LEFT TOTAL KNEE ARTHROPLASTY;  Surgeon: Lonni CINDERELLA Poli, MD;  Location: [facility] ORS;  Service: Orthopedics;  Laterality: Left;   TOTAL KNEE ARTHROPLASTY Right 05/04/2021   Procedure: RIGHT TOTAL KNEE ARTHROPLASTY;  Surgeon: Poli Lonni CINDERELLA, MD;  Location: [facility] OR[facility];  Service: Orthopedics;  Laterality: Right;    Family History  Problem Relation Age of Onset   Heart disease Mother        post CABG history of CHF   COPD Father    Diabetes Sister    Hypertension Sister        post renal transplant   Heart disease Brother  CAD   Stomach cancer Neg Hx    Pancreatic cancer Neg Hx    Colon cancer Neg Hx    Esophageal cancer Neg Hx     SOCIAL HX:   reports that she quit smoking about 32 years ago. Her smoking use included cigarettes. She has never used smokeless tobacco. She reports current alcohol  use. She reports that she does not use drugs.  Current Medications[1]  EXAM:   VITALS per patient if applicable: None reported  GENERAL: alert, oriented, appears well and in no acute distress  HEENT: atraumatic, conjunttiva clear, no obvious abnormalities on inspection of external nose and ears  NECK: normal movements of the head and neck  LUNGS: on inspection no signs of respiratory distress, breathing rate appears normal, no obvious gross increased work of breathing, gasping or  wheezing  CV: no obvious cyanosis  MS: moves all visible extremities without noticeable abnormality  PSYCH/NEURO: pleasant and cooperative, no obvious depression or anxiety, speech and thought processing grossly intact  ASSESSMENT AND PLAN:   Attention deficit hyperactivity disorder (ADHD), predominantly inattentive type - Plan: amphetamine -dextroamphetamine  (ADDERALL ) 20 MG tablet, amphetamine -dextroamphetamine  (ADDERALL ) 20 MG tablet, amphetamine -dextroamphetamine  (ADDERALL ) 20 MG tablet, amphetamine -dextroamphetamine  (ADDERALL ) 10 MG tablet, amphetamine -dextroamphetamine  (ADDERALL ) 10 MG tablet, amphetamine -dextroamphetamine  (ADDERALL ) 10 MG tablet  Sleep disorder - Plan: zolpidem  (AMBIEN ) 5 MG tablet  -PDMP reviewed, no red flags, overdose rescore is 20. - Refill Ambien  5 mg which is decreased from previous dose of 10 mg. - We will decrease both morning and afternoon doses of Ambien  with the goal to get them to the lowest doses and possibly eliminate the afternoon dose.  Will decrease morning dose of Adderall  from 30 to 20 mg and afternoon dose of Adderall  from 20 to 10 mg.  Reassess in 3 months.   I discussed the assessment and treatment plan with the patient. The patient was provided an opportunity to ask questions and all were answered. The patient agreed with the plan and demonstrated an understanding of the instructions.   The patient was advised to call back or seek an in-person evaluation if the symptoms worsen or if the condition fails to improve as anticipated.    Tully Theophilus Andrews, MD  [facility]    [1]  Current Outpatient Medications:    ALPRAZolam  (XANAX ) 0.25 MG tablet, TAKE 1 TABLET(0.25 MG) BY MOUTH AT BEDTIME AS NEEDED FOR ANXIETY, Disp: 30 tablet, Rfl: 0   amphetamine -dextroamphetamine  (ADDERALL  XR) 30 MG 24 hr capsule, Take 1 capsule (30 mg total) by mouth daily., Disp: 30 capsule, Rfl: 0   amphetamine -dextroamphetamine  (ADDERALL   XR) 30 MG 24 hr capsule, Take 1 capsule (30 mg total) by mouth daily., Disp: 30 capsule, Rfl: 0   amphetamine -dextroamphetamine  (ADDERALL  XR) 30 MG 24 hr capsule, Take 1 capsule (30 mg total) by mouth daily., Disp: 30 capsule, Rfl: 0   amphetamine -dextroamphetamine  (ADDERALL ) 10 MG tablet, At 2 pm if needed, Disp: 30 tablet, Rfl: 0   amphetamine -dextroamphetamine  (ADDERALL ) 10 MG tablet, At 2 pm if needed, Disp: 30 tablet, Rfl: 0   amphetamine -dextroamphetamine  (ADDERALL ) 10 MG tablet, At 2 pm if needed, Disp: 30 tablet, Rfl: 0   atorvastatin  (LIPITOR) 40 MG tablet, Take 1 tablet (40 mg total) by mouth daily. Take 40 mg by mouth daily.Please schedule an appointment for further refills, Disp: 30 tablet, Rfl: 1   ELIQUIS  5 MG TABS tablet, TAKE 1 TABLET(5 MG) BY MOUTH TWICE DAILY, Disp: 180 tablet, Rfl: 1   FLUoxetine  (PROZAC ) 40 MG  capsule, TAKE 1 CAPSULE(40 MG) BY MOUTH EVERY MORNING, Disp: 90 capsule, Rfl: 1   furosemide  (LASIX ) 40 MG tablet, Take 0.5 tablets (20 mg total) by mouth daily as needed for edema or fluid., Disp: 30 tablet, Rfl: 1   gabapentin  (NEURONTIN ) 300 MG capsule, TAKE 1 CAPSULE BY MOUTH THREE TIMES DAILY, Disp: 90 capsule, Rfl: 2   lamoTRIgine  (LAMICTAL ) 100 MG tablet, TAKE 1 TABLET(100 MG) BY MOUTH TWICE DAILY, Disp: 180 tablet, Rfl: 1   losartan  (COZAAR ) 25 MG tablet, TAKE 1/2 TABLET(12.5 MG) BY MOUTH DAILY, Disp: 45 tablet, Rfl: 3   melatonin 3 MG TABS tablet, Take 1 tablet (3 mg total) by mouth at bedtime., Disp: , Rfl: 0   metoprolol  succinate (TOPROL -XL) 50 MG 24 hr tablet, TAKE 1 TABLET(50 MG) BY MOUTH AT BEDTIME WITH OR IMMEDIATELY FOLLOWING A MEAL, Disp: 90 tablet, Rfl: 4   Multiple Vitamins-Minerals (PRESERVISION AREDS 2) CAPS, Take 1 capsule by mouth 2 (two) times daily., Disp: , Rfl:    spironolactone  (ALDACTONE ) 25 MG tablet, Take 0.5 tablets (12.5 mg total) by mouth daily., Disp: 30 tablet, Rfl: 5   amphetamine -dextroamphetamine  (ADDERALL ) 20 MG tablet, Take 1 tablet  (20 mg total) by mouth daily., Disp: 30 tablet, Rfl: 0   amphetamine -dextroamphetamine  (ADDERALL ) 20 MG tablet, Take 1 tablet (20 mg total) by mouth daily., Disp: 30 tablet, Rfl: 0   amphetamine -dextroamphetamine  (ADDERALL ) 20 MG tablet, Take 1 tablet (20 mg total) by mouth daily., Disp: 30 tablet, Rfl: 0   Cholecalciferol (VITAMIN D3) 50 MCG (2000 UT) TABS, Take 2,000 Units by mouth daily. (Patient not taking: Reported on 08/10/2024), Disp: , Rfl:    Semaglutide -Weight Management (WEGOVY ) 0.25 MG/0.5ML SOAJ, Inject 0.25 mg into the skin once a week. (Patient not taking: Reported on 08/10/2024), Disp: 2 mL, Rfl: 0   zolpidem  (AMBIEN ) 5 MG tablet, Take 1 tablet (5 mg total) by mouth at bedtime., Disp: 30 tablet, Rfl: 0  "

## 2024-08-25 ENCOUNTER — Other Ambulatory Visit: Payer: Self-pay | Admitting: Internal Medicine
